# Patient Record
Sex: Male | Born: 1975 | Race: Black or African American | Hispanic: No | Marital: Married | Smoking: Never smoker
Health system: Southern US, Community
[De-identification: ages and names within clinical notes are randomized; demographics above are authoritative.]

## PROBLEM LIST (undated history)

## (undated) DIAGNOSIS — I10 Essential (primary) hypertension: Secondary | ICD-10-CM

---

## 2015-03-29 ENCOUNTER — Ambulatory Visit: Payer: Self-pay | Admitting: Family Medicine

## 2015-03-30 ENCOUNTER — Ambulatory Visit
Admission: EM | Admit: 2015-03-30 | Discharge: 2015-03-30 | Disposition: A | Discharge status: Home / Self Care | Payer: Managed Care, Other (non HMO) | Attending: Family Medicine | Admitting: Family Medicine

## 2015-03-30 ENCOUNTER — Ambulatory Visit (INDEPENDENT_AMBULATORY_CARE_PROVIDER_SITE_OTHER): Payer: Managed Care, Other (non HMO)

## 2015-03-30 ENCOUNTER — Encounter: Payer: Self-pay | Admitting: *Deleted

## 2015-03-30 DIAGNOSIS — M545 Low back pain, unspecified: Secondary | ICD-10-CM

## 2015-03-30 DIAGNOSIS — M5090 Cervical disc disorder, unspecified, unspecified cervical region: Secondary | ICD-10-CM

## 2015-03-30 DIAGNOSIS — M5489 Other dorsalgia: Secondary | ICD-10-CM

## 2015-03-30 MED ORDER — NAPROXEN 500 MG PO TABS: 500.0000 mg | ORAL_TABLET | Freq: Two times a day (BID) | ORAL | Status: DC

## 2015-03-30 MED ORDER — DIAZEPAM 2 MG PO TABS: ORAL_TABLET | ORAL | Status: DC
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
## 2015-04-10 ENCOUNTER — Ambulatory Visit (INDEPENDENT_AMBULATORY_CARE_PROVIDER_SITE_OTHER): Payer: Managed Care, Other (non HMO) | Admitting: Family Medicine

## 2015-04-10 ENCOUNTER — Other Ambulatory Visit: Payer: Self-pay | Admitting: Family Medicine

## 2015-04-10 ENCOUNTER — Ambulatory Visit
Admission: RE | Admit: 2015-04-10 | Discharge: 2015-04-10 | Disposition: A | Discharge status: Home / Self Care | Payer: Managed Care, Other (non HMO) | Source: Ambulatory Visit | Attending: Family Medicine | Admitting: Family Medicine

## 2015-04-10 ENCOUNTER — Encounter: Payer: Self-pay | Admitting: Family Medicine

## 2015-04-10 VITALS — BP 146/90 | HR 92 | Ht 76.0 in | Wt 382.6 lb

## 2015-04-10 DIAGNOSIS — M546 Pain in thoracic spine: Secondary | ICD-10-CM

## 2015-04-10 MED ORDER — HYDROCODONE-ACETAMINOPHEN 10-325 MG PO TABS: 1.0000 | ORAL_TABLET | Freq: Every evening | ORAL | Status: DC | PRN

## 2015-04-10 NOTE — Patient Instructions (Signed)
Stop the Diazepam (Valium). Start taking Naproxen 500 mg twice daily with food and you may also take Tylenol 650 mg twice daily as well. You may take one hydrocodone as needed at bedtime however be aware that you should not drive, operate machinery, drink alcohol with this medication. Continue using heat to area 20 minutes at a time. I will let you know your XR result. Referral made to physical therapy.
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
## 2015-04-12 ENCOUNTER — Encounter: Payer: Self-pay | Admitting: Internal Medicine

## 2015-06-11 ENCOUNTER — Telehealth: Payer: Self-pay | Admitting: Family Medicine

## 2015-06-11 NOTE — Telephone Encounter (Signed)
Received signed records request to send records to [facility], [facility], [facility] and [facility]. Records faxed to [phone].

## 2015-06-20 DIAGNOSIS — Z0279 Encounter for issue of other medical certificate: Secondary | ICD-10-CM

## 2016-09-20 ENCOUNTER — Encounter (HOSPITAL_COMMUNITY): Payer: Self-pay | Admitting: Emergency Medicine

## 2016-09-20 ENCOUNTER — Ambulatory Visit (HOSPITAL_COMMUNITY)
Admission: EM | Admit: 2016-09-20 | Discharge: 2016-09-20 | Disposition: A | Discharge status: Nursing Home (Includes ICF) | Payer: Managed Care, Other (non HMO) | Attending: Internal Medicine | Admitting: Internal Medicine

## 2016-09-20 DIAGNOSIS — W57XXXA Bitten or stung by nonvenomous insect and other nonvenomous arthropods, initial encounter: Secondary | ICD-10-CM

## 2016-09-20 DIAGNOSIS — Z5321 Procedure and treatment not carried out due to patient leaving prior to being seen by health care provider: Secondary | ICD-10-CM | POA: Insufficient documentation

## 2016-09-20 DIAGNOSIS — L03115 Cellulitis of right lower limb: Secondary | ICD-10-CM

## 2016-09-20 DIAGNOSIS — Y939 Activity, unspecified: Secondary | ICD-10-CM | POA: Insufficient documentation

## 2016-09-20 DIAGNOSIS — S70361A Insect bite (nonvenomous), right thigh, initial encounter: Secondary | ICD-10-CM | POA: Insufficient documentation

## 2016-09-20 DIAGNOSIS — Y929 Unspecified place or not applicable: Secondary | ICD-10-CM | POA: Insufficient documentation

## 2016-09-20 DIAGNOSIS — Y999 Unspecified external cause status: Secondary | ICD-10-CM | POA: Insufficient documentation

## 2016-09-20 LAB — BASIC METABOLIC PANEL
ANION GAP: 8 (ref 5–15)
BUN: 8 mg/dL (ref 6–20)
CALCIUM: 9.1 mg/dL (ref 8.9–10.3)
CO2: 28 mmol/L (ref 22–32)
Chloride: 96 mmol/L — ABNORMAL LOW (ref 101–111)
Creatinine, Ser: 0.82 mg/dL (ref 0.61–1.24)
GLUCOSE: 370 mg/dL — AB (ref 65–99)
POTASSIUM: 4.3 mmol/L (ref 3.5–5.1)
SODIUM: 132 mmol/L — AB (ref 135–145)

## 2016-09-20 LAB — CBC
HCT: 47.7 % (ref 39.0–52.0)
Hemoglobin: 15.8 g/dL (ref 13.0–17.0)
MCH: 27.3 pg (ref 26.0–34.0)
MCHC: 33.1 g/dL (ref 30.0–36.0)
MCV: 82.5 fL (ref 78.0–100.0)
PLATELETS: 265 10*3/uL (ref 150–400)
RBC: 5.78 MIL/uL (ref 4.22–5.81)
RDW: 13 % (ref 11.5–15.5)
WBC: 10.4 10*3/uL (ref 4.0–10.5)

## 2016-09-20 LAB — I-STAT CG4 LACTIC ACID, ED: Lactic Acid, Venous: 1.78 mmol/L (ref 0.5–1.9)

## 2016-09-20 NOTE — ED Triage Notes (Signed)
Insect bite to right thigh noticed 3 days ago.  Patient has had a fever

## 2016-09-20 NOTE — ED Triage Notes (Signed)
Noticed area to right inner thigh 3 days ago.  Thinks he was bitten by a spider.  [facility]  Sent here.  Reports drainage coming from wound.  Red and swollen.  Also reporting pain all over body with paralysis in both hands.  Also c/o dizzy spells.

## 2016-09-20 NOTE — ED Provider Notes (Signed)
CSN: [id number]     Arrival date & time 09/20/16  1748 History   First MD Initiated Contact with Patient 09/20/16 1942     Chief Complaint  Patient presents with  . Insect Bite   (Consider location/radiation/quality/duration/timing/severity/associated sxs/prior Treatment) Patient is a 41 year old male, presents to the urgent care today with concern for spider bite to his right groin area  4 days. Patient endorses swelling and pain and discharge at the site. Patient states that the wound is looking really bad today. Patient now is having tingling sensation at his arms and is having pain "everywhere". He also broke 2 fevers today.       History reviewed. No pertinent past medical history. No past surgical history on file. Family History  Problem Relation Age of Onset  . Arthritis Mother   . Allergies Father   . Hypertension Father   . Diabetes Father    Social History  Substance Use Topics  . Smoking status: Never Smoker  . Smokeless tobacco: Never Used  . Alcohol use No    Review of Systems  Constitutional:       See HPI    Allergies  Red dye  Home Medications   Prior to Admission medications   Medication Sig Start Date End Date Taking? Authorizing Provider  Naproxen Sodium (ALEVE PO) Take by mouth.   Yes [provider]  acetaminophen (TYLENOL) 650 MG CR tablet Take 650 mg by mouth every 8 (eight) hours as needed for pain. Take 2 pills once daily    [provider]  diazepam (VALIUM) 5 MG tablet take 1 tablet by mouth twice a day for 5 days 03/31/15   [provider]  HYDROcodone-acetaminophen (NORCO) 10-325 MG tablet Take 1 tablet by mouth at bedtime as needed. 04/10/15   Henson, Vickie L, NP-C  ibuprofen (ADVIL,MOTRIN) 200 MG tablet Take 800 mg by mouth daily.    [provider]  naproxen (NAPROSYN) 500 MG tablet Take 1 tablet (500 mg total) by mouth 2 (two) times daily. 03/30/15   Jolene ProvostPatel, Kirtida, MD   Meds Ordered and  Administered this Visit  Medications - No data to display  BP (!) 151/85 (BP Location: Left Arm)   Pulse (!) 113   Temp 99.5 F (37.5 C) (Oral)   Resp (!) 22   SpO2 97%  No data found.   Physical Exam  Constitutional: He is oriented to person, place, and time. He appears well-developed and well-nourished.  Cardiovascular: Normal rate and regular rhythm.   No murmur heard. Pulmonary/Chest: Effort normal and breath sounds normal. He has no wheezes.  Neurological: He is alert and oriented to person, place, and time.  Skin:  See picture below.   Nursing note and vitals reviewed.       Urgent Care Course     Procedures (including critical care time)  Labs Review Labs Reviewed - No data to display  Imaging Review No results found.  MDM   1. Insect bite, initial encounter   2. Cellulitis of right lower extremity    Given the extensiveness of the infection with significant swelling, pain and inflammation at the site. Patient send to ER for more comprehensive evaluation that we cannot do here in an urgent care setting.     Lucia EstelleZheng, Maurisio Ruddy, NP 09/20/16 2134

## 2016-09-21 ENCOUNTER — Emergency Department (HOSPITAL_COMMUNITY)
Admission: EM | Admit: 2016-09-21 | Discharge: 2016-09-21 | Disposition: A | Discharge status: Home / Self Care | Payer: Managed Care, Other (non HMO) | Attending: Emergency Medicine | Admitting: Emergency Medicine

## 2016-09-21 NOTE — ED Notes (Signed)
Changed pt's dressing.

## 2016-09-21 NOTE — ED Notes (Signed)
Pt left AMA °

## 2016-09-22 ENCOUNTER — Ambulatory Visit (HOSPITAL_COMMUNITY)
Admission: EM | Admit: 2016-09-22 | Discharge: 2016-09-22 | Disposition: A | Discharge status: Home / Self Care | Payer: Managed Care, Other (non HMO) | Attending: Internal Medicine | Admitting: Internal Medicine

## 2016-09-22 ENCOUNTER — Encounter (HOSPITAL_COMMUNITY): Payer: Self-pay | Admitting: Emergency Medicine

## 2016-09-22 DIAGNOSIS — L03314 Cellulitis of groin: Secondary | ICD-10-CM

## 2016-09-22 MED ORDER — HYDROCODONE-ACETAMINOPHEN 5-325 MG PO TABS
ORAL_TABLET | ORAL | Status: AC
  Filled 2016-09-22: qty 1

## 2016-09-22 MED ORDER — CEFTRIAXONE SODIUM 1 G IJ SOLR
1.0000 g | Freq: Once | INTRAMUSCULAR | Status: AC
  Administered 2016-09-22: 1 g via INTRAMUSCULAR

## 2016-09-22 MED ORDER — HYDROCODONE-ACETAMINOPHEN 5-325 MG PO TABS
2.0000 | ORAL_TABLET | Freq: Once | ORAL | Status: AC
  Administered 2016-09-22: 2 via ORAL

## 2016-09-22 MED ORDER — DOXYCYCLINE HYCLATE 100 MG PO TABS
ORAL_TABLET | ORAL | Status: AC
  Filled 2016-09-22: qty 1

## 2016-09-22 MED ORDER — DOXYCYCLINE HYCLATE 100 MG PO TABS
100.0000 mg | ORAL_TABLET | Freq: Once | ORAL | Status: AC
  Administered 2016-09-22: 100 mg via ORAL

## 2016-09-22 MED ORDER — CEFTRIAXONE SODIUM 1 G IJ SOLR
INTRAMUSCULAR | Status: AC
  Filled 2016-09-22: qty 10

## 2016-09-22 MED ORDER — DOXYCYCLINE HYCLATE 100 MG PO CAPS: 100.0000 mg | ORAL_CAPSULE | Freq: Two times a day (BID) | ORAL | 0 refills | Status: DC

## 2016-09-22 MED ORDER — LIDOCAINE HCL (PF) 1 % IJ SOLN
INTRAMUSCULAR | Status: AC
  Filled 2016-09-22: qty 2

## 2016-09-22 NOTE — Discharge Instructions (Signed)
You have been treated today with Rocephin and Doxycyline. When you go home, soak as long as you can in a warm tub of water, return to this clinic tomorrow morning. We open at 10 am, I would recommend coming in early.

## 2016-09-22 NOTE — ED Triage Notes (Signed)
On 5/12 seen at [facility] and sent to ed.   Patient was not seen in ed, patient returned to [facility] for medication.  Patient reports blister is getting larger, pain is worsening, continues running fever.

## 2016-09-22 NOTE — ED Provider Notes (Signed)
CSN: [id number]     Arrival date & time 09/22/16  1009 History   First MD Initiated Contact with Patient 09/22/16 1116     No chief complaint on file.  (Consider location/radiation/quality/duration/timing/severity/associated sxs/prior Treatment) 41 year old male presents to clinic for reevaluation of cellulitis. He was seen on 09/20/2016 here in this clinic, and referred to the emergency room for further evaluation and management. He was at the ER, had blood work drawn, but left without being seen. He presents today, stating he still in pain, and the area is still draining, he has not had any antibiotics. He is morbidly obese, however he denies any past medical problems. There is a family history of diabetes, and hypertension. He states he has had some fever, but that has improved. No nausea, vomiting, diarrhea, or other symptoms.   The history is provided by the patient.    History reviewed. No pertinent past medical history. History reviewed. No pertinent surgical history. Family History  Problem Relation Age of Onset  . Arthritis Mother   . Allergies Father   . Hypertension Father   . Diabetes Father    Social History  Substance Use Topics  . Smoking status: Never Smoker  . Smokeless tobacco: Never Used  . Alcohol use No    Review of Systems  Constitutional: Positive for chills and fever. Negative for appetite change.  Respiratory: Negative.   Cardiovascular: Negative.   Gastrointestinal: Negative.   Musculoskeletal: Negative for myalgias, neck pain and neck stiffness.  Skin:       abscess  Neurological: Negative.     Allergies  Red dye  Home Medications   Prior to Admission medications   Medication Sig Start Date End Date Taking? Authorizing Provider  acetaminophen (TYLENOL) 650 MG CR tablet Take 650 mg by mouth every 8 (eight) hours as needed for pain. Take 2 pills once daily    [provider]  diazepam (VALIUM) 5 MG tablet take 1 tablet by mouth twice a  day for 5 days 03/31/15   [provider]  doxycycline (VIBRAMYCIN) 100 MG capsule Take 1 capsule (100 mg total) by mouth 2 (two) times daily. 09/22/16   Dorena Bodo, NP  HYDROcodone-acetaminophen (NORCO) 10-325 MG tablet Take 1 tablet by mouth at bedtime as needed. 04/10/15   Henson, Vickie L, NP-C  ibuprofen (ADVIL,MOTRIN) 200 MG tablet Take 800 mg by mouth daily.    [provider]  naproxen (NAPROSYN) 500 MG tablet Take 1 tablet (500 mg total) by mouth 2 (two) times daily. 03/30/15   Jolene Provost, MD  Naproxen Sodium (ALEVE PO) Take by mouth.    [provider]   Meds Ordered and Administered this Visit   Medications  cefTRIAXone (ROCEPHIN) injection 1 g (1 g Intramuscular Given 09/22/16 1144)  doxycycline (VIBRA-TABS) tablet 100 mg (100 mg Oral Given 09/22/16 1143)  HYDROcodone-acetaminophen (NORCO/VICODIN) 5-325 MG per tablet 2 tablet (2 tablets Oral Given 09/22/16 1142)    BP (!) 141/78 (BP Location: Left Arm)   Pulse (!) 111 Comment: notified rn  Temp 98.8 F (37.1 C) (Oral)   Resp 16   SpO2 99%  No data found.   Physical Exam  Constitutional: He is oriented to person, place, and time. He appears well-developed and well-nourished. No distress.  HENT:  Head: Normocephalic and atraumatic.  Right Ear: External ear normal.  Left Ear: External ear normal.  Eyes: Conjunctivae are normal.  Neck: Normal range of motion.  Neurological: He is alert and oriented to person,  place, and time.  Skin: Skin is warm and dry. Capillary refill takes less than 2 seconds. He is not diaphoretic.  See attached photo  Psychiatric: He has a normal mood and affect. His behavior is normal.  Nursing note and vitals reviewed.     Urgent Care Course     Procedures (including critical care time)  Labs Review Labs Reviewed - No data to display  Imaging Review No results found.    MDM   1. Cellulitis of groin    Patient started on ceftriaxone, and  doxycycline in clinic, instructed to return to clinic in 24 hours for reevaluation, his blood glucose level at the ER was 370. Plan to recheck tomorrow, and most likely will start metformin. We'll also refer to [facility] to establish for primary care.    Dorena BodoKennard, Caylor Tallarico, NP 09/22/16 1156
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
## 2022-06-06 ENCOUNTER — Other Ambulatory Visit: Payer: Self-pay

## 2022-06-06 ENCOUNTER — Emergency Department (HOSPITAL_COMMUNITY)
Admission: EM | Admit: 2022-06-06 | Discharge: 2022-06-07 | DRG: 638 | Discharge status: Against Medical Advice | Payer: Medicaid - Out of State | Attending: Internal Medicine | Admitting: Internal Medicine

## 2022-06-06 ENCOUNTER — Encounter (HOSPITAL_COMMUNITY): Payer: Self-pay

## 2022-06-06 ENCOUNTER — Emergency Department (HOSPITAL_COMMUNITY): Payer: Self-pay

## 2022-06-06 DIAGNOSIS — L089 Local infection of the skin and subcutaneous tissue, unspecified: Secondary | ICD-10-CM | POA: Diagnosis not present

## 2022-06-06 DIAGNOSIS — R000 Tachycardia, unspecified: Secondary | ICD-10-CM | POA: Diagnosis present

## 2022-06-06 DIAGNOSIS — M869 Osteomyelitis, unspecified: Principal | ICD-10-CM

## 2022-06-06 DIAGNOSIS — I10 Essential (primary) hypertension: Secondary | ICD-10-CM | POA: Diagnosis present

## 2022-06-06 DIAGNOSIS — Z8249 Family history of ischemic heart disease and other diseases of the circulatory system: Secondary | ICD-10-CM

## 2022-06-06 DIAGNOSIS — E1169 Type 2 diabetes mellitus with other specified complication: Secondary | ICD-10-CM | POA: Diagnosis present

## 2022-06-06 DIAGNOSIS — E11628 Type 2 diabetes mellitus with other skin complications: Secondary | ICD-10-CM | POA: Diagnosis present

## 2022-06-06 DIAGNOSIS — E1165 Type 2 diabetes mellitus with hyperglycemia: Secondary | ICD-10-CM | POA: Diagnosis not present

## 2022-06-06 DIAGNOSIS — Z794 Long term (current) use of insulin: Secondary | ICD-10-CM

## 2022-06-06 DIAGNOSIS — Z8261 Family history of arthritis: Secondary | ICD-10-CM

## 2022-06-06 DIAGNOSIS — R509 Fever, unspecified: Secondary | ICD-10-CM | POA: Diagnosis present

## 2022-06-06 DIAGNOSIS — Z833 Family history of diabetes mellitus: Secondary | ICD-10-CM

## 2022-06-06 DIAGNOSIS — I160 Hypertensive urgency: Secondary | ICD-10-CM | POA: Diagnosis present

## 2022-06-06 DIAGNOSIS — D72829 Elevated white blood cell count, unspecified: Secondary | ICD-10-CM | POA: Diagnosis present

## 2022-06-06 DIAGNOSIS — Z5329 Procedure and treatment not carried out because of patient's decision for other reasons: Secondary | ICD-10-CM | POA: Diagnosis present

## 2022-06-06 DIAGNOSIS — Z89412 Acquired absence of left great toe: Secondary | ICD-10-CM

## 2022-06-06 HISTORY — DX: Type 2 diabetes mellitus with hyperglycemia: E11.65

## 2022-06-06 HISTORY — DX: Essential (primary) hypertension: I10

## 2022-06-06 LAB — COMPREHENSIVE METABOLIC PANEL
ALT: 9 U/L (ref 0–44)
AST: 11 U/L — ABNORMAL LOW (ref 15–41)
Albumin: <1.5 g/dL — ABNORMAL LOW (ref 3.5–5.0)
Alkaline Phosphatase: 181 U/L — ABNORMAL HIGH (ref 38–126)
Anion gap: 6 (ref 5–15)
BUN: 11 mg/dL (ref 6–20)
CO2: 27 mmol/L (ref 22–32)
Calcium: 8 mg/dL — ABNORMAL LOW (ref 8.9–10.3)
Chloride: 94 mmol/L — ABNORMAL LOW (ref 98–111)
Creatinine, Ser: 1.33 mg/dL — ABNORMAL HIGH (ref 0.61–1.24)
GFR, Estimated: >60 mL/min (ref >60)
Glucose, Bld: 385 mg/dL — ABNORMAL HIGH (ref 70–99)
Potassium: 4.5 mmol/L (ref 3.5–5.1)
Sodium: 127 mmol/L — ABNORMAL LOW (ref 135–145)
Total Bilirubin: 0.3 mg/dL (ref 0.3–1.2)
Total Protein: 7.6 g/dL (ref 6.5–8.1)

## 2022-06-06 LAB — CBC WITH DIFFERENTIAL/PLATELET
Abs Immature Granulocytes: 0.11 10*3/uL — ABNORMAL HIGH (ref 0.00–0.07)
Basophils Absolute: 0.1 10*3/uL (ref 0.0–0.1)
Basophils Relative: 1 %
Eosinophils Absolute: 0.1 10*3/uL (ref 0.0–0.5)
Eosinophils Relative: 1 %
HCT: 36.6 % — ABNORMAL LOW (ref 39.0–52.0)
Hemoglobin: 11 g/dL — ABNORMAL LOW (ref 13.0–17.0)
Immature Granulocytes: 1 %
Lymphocytes Relative: 16 %
Lymphs Abs: 1.9 10*3/uL (ref 0.7–4.0)
MCH: 23.7 pg — ABNORMAL LOW (ref 26.0–34.0)
MCHC: 30.1 g/dL (ref 30.0–36.0)
MCV: 78.9 fL — ABNORMAL LOW (ref 80.0–100.0)
Monocytes Absolute: 1.3 10*3/uL — ABNORMAL HIGH (ref 0.1–1.0)
Monocytes Relative: 10 %
Neutro Abs: 8.7 10*3/uL — ABNORMAL HIGH (ref 1.7–7.7)
Neutrophils Relative %: 71 %
Platelets: 460 10*3/uL — ABNORMAL HIGH (ref 150–400)
RBC: 4.64 MIL/uL (ref 4.22–5.81)
RDW: 14.3 % (ref 11.5–15.5)
WBC: 12.2 10*3/uL — ABNORMAL HIGH (ref 4.0–10.5)
nRBC: 0 % (ref 0.0–0.2)

## 2022-06-06 LAB — LACTIC ACID, PLASMA
Lactic Acid, Venous: 1.4 mmol/L (ref 0.5–1.9)
Lactic Acid, Venous: 1.7 mmol/L (ref 0.5–1.9)

## 2022-06-06 MED ORDER — HYDROMORPHONE HCL 1 MG/ML IJ SOLN: 0.5000 mg | INTRAMUSCULAR | Status: DC | PRN

## 2022-06-06 MED ORDER — SODIUM CHLORIDE 0.9 % IV SOLN: INTRAVENOUS | Status: DC

## 2022-06-06 MED ORDER — LACTATED RINGERS IV BOLUS
1000.0000 mL | Freq: Once | INTRAVENOUS | Status: AC
  Administered 2022-06-06: 1000 mL via INTRAVENOUS

## 2022-06-06 MED ORDER — VANCOMYCIN HCL 1500 MG/300ML IV SOLN
1500.0000 mg | Freq: Two times a day (BID) | INTRAVENOUS | Status: DC
  Filled 2022-06-06: qty 300

## 2022-06-06 MED ORDER — SODIUM CHLORIDE 0.9 % IV SOLN
2.0000 g | Freq: Three times a day (TID) | INTRAVENOUS | Status: DC
  Administered 2022-06-06: 2 g via INTRAVENOUS
  Filled 2022-06-06 (×2): qty 12.5

## 2022-06-06 MED ORDER — VANCOMYCIN HCL 10 G IV SOLR
2500.0000 mg | Freq: Once | INTRAVENOUS | Status: AC
  Administered 2022-06-06: 2500 mg via INTRAVENOUS
  Filled 2022-06-06: qty 40

## 2022-06-06 MED ORDER — BISACODYL 5 MG PO TBEC: 5.0000 mg | DELAYED_RELEASE_TABLET | Freq: Every day | ORAL | Status: DC | PRN

## 2022-06-06 MED ORDER — CARVEDILOL 12.5 MG PO TABS
25.0000 mg | ORAL_TABLET | Freq: Two times a day (BID) | ORAL | Status: DC
  Administered 2022-06-06 – 2022-06-07 (×2): 25 mg via ORAL
  Filled 2022-06-06 (×2): qty 2

## 2022-06-06 MED ORDER — HYDROCODONE-ACETAMINOPHEN 5-325 MG PO TABS
1.0000 | ORAL_TABLET | ORAL | Status: AC
  Administered 2022-06-06: 1 via ORAL
  Filled 2022-06-06: qty 1

## 2022-06-06 MED ORDER — OXYCODONE HCL 5 MG PO TABS: 5.0000 mg | ORAL_TABLET | ORAL | Status: DC | PRN

## 2022-06-06 MED ORDER — INSULIN ASPART 100 UNIT/ML IJ SOLN
0.0000 [IU] | INTRAMUSCULAR | Status: DC
  Administered 2022-06-07 (×3): 9 [IU] via SUBCUTANEOUS
  Administered 2022-06-07: 7 [IU] via SUBCUTANEOUS

## 2022-06-06 MED ORDER — METRONIDAZOLE 500 MG/100ML IV SOLN
500.0000 mg | Freq: Two times a day (BID) | INTRAVENOUS | Status: DC
  Administered 2022-06-06 – 2022-06-07 (×2): 500 mg via INTRAVENOUS
  Filled 2022-06-06 (×2): qty 100

## 2022-06-06 MED ORDER — ACETAMINOPHEN 650 MG RE SUPP: 650.0000 mg | Freq: Four times a day (QID) | RECTAL | Status: DC | PRN

## 2022-06-06 MED ORDER — HYDROCODONE-ACETAMINOPHEN 5-325 MG PO TABS
1.0000 | ORAL_TABLET | Freq: Once | ORAL | Status: AC
  Administered 2022-06-06: 1 via ORAL
  Filled 2022-06-06: qty 1

## 2022-06-06 MED ORDER — ACETAMINOPHEN 325 MG PO TABS: 650.0000 mg | ORAL_TABLET | Freq: Four times a day (QID) | ORAL | Status: DC | PRN

## 2022-06-06 MED ORDER — POLYETHYLENE GLYCOL 3350 17 G PO PACK: 17.0000 g | PACK | Freq: Every day | ORAL | Status: DC | PRN

## 2022-06-06 MED ORDER — ONDANSETRON HCL 4 MG PO TABS: 4.0000 mg | ORAL_TABLET | Freq: Four times a day (QID) | ORAL | Status: DC | PRN

## 2022-06-06 MED ORDER — INSULIN GLARGINE-YFGN 100 UNIT/ML ~~LOC~~ SOLN
10.0000 [IU] | Freq: Every day | SUBCUTANEOUS | Status: DC
  Administered 2022-06-06: 10 [IU] via SUBCUTANEOUS
  Filled 2022-06-06 (×2): qty 0.1

## 2022-06-06 MED ORDER — ONDANSETRON HCL 4 MG/2ML IJ SOLN: 4.0000 mg | Freq: Four times a day (QID) | INTRAMUSCULAR | Status: DC | PRN

## 2022-06-06 MED ORDER — ENOXAPARIN SODIUM 80 MG/0.8ML IJ SOSY
80.0000 mg | PREFILLED_SYRINGE | INTRAMUSCULAR | Status: DC
  Administered 2022-06-07: 80 mg via SUBCUTANEOUS
  Filled 2022-06-06 (×2): qty 0.8

## 2022-06-06 MED ORDER — SODIUM CHLORIDE 0.9% FLUSH
3.0000 mL | Freq: Two times a day (BID) | INTRAVENOUS | Status: DC
  Administered 2022-06-07: 3 mL via INTRAVENOUS

## 2022-06-06 NOTE — ED Provider Notes (Signed)
[place] [facility] AT [facility] Provider Note   CSN: [id number] Arrival date & time: 06/06/22  1318     History  Chief Complaint  Patient presents with   Lt Foot wound    Quadre Bristol. is a 47 y.o. male.  47 year old male presents today for evaluation of left foot wound that he is concerned is infected.  Has history of osteomyelitis.  Underwent resection in [name] in November 2023.  Has not had follow-up with podiatry since then.  States over the past 2 weeks he has had foul smell, and drainage from the wound.  Also endorses subjective fever with diaphoresis.  Particularly over the past 2 nights.  MSE note mentioned patient was taken daptomycin.  Patient states he has not been taking any antibiotics since about a week or so since his surgery.  The history is provided by the patient. No language interpreter was used.       Home Medications Prior to Admission medications   Medication Sig Start Date End Date Taking? Authorizing Provider  acetaminophen (TYLENOL) 650 MG CR tablet Take 650 mg by mouth every 8 (eight) hours as needed for pain. Take 2 pills once daily    [provider]  diazepam (VALIUM) 5 MG tablet take 1 tablet by mouth twice a day for 5 days 03/31/15   [provider]  doxycycline (VIBRAMYCIN) 100 MG capsule Take 1 capsule (100 mg total) by mouth 2 (two) times daily. 09/22/16   Dorena Bodo, NP  HYDROcodone-acetaminophen (NORCO) 10-325 MG tablet Take 1 tablet by mouth at bedtime as needed. 04/10/15   Henson, Vickie L, NP-C  ibuprofen (ADVIL,MOTRIN) 200 MG tablet Take 800 mg by mouth daily.    [provider]  naproxen (NAPROSYN) 500 MG tablet Take 1 tablet (500 mg total) by mouth 2 (two) times daily. 03/30/15   Jolene Provost, MD  Naproxen Sodium (ALEVE PO) Take by mouth.    [provider]      Allergies    Red dye    Review of Systems   Review of Systems  Constitutional:  Positive for  chills and fever.  Musculoskeletal:  Positive for arthralgias.  Skin:  Positive for wound.  All other systems reviewed and are negative.   Physical Exam Updated Vital Signs BP (!) 132/95 (BP Location: Left Arm)   Pulse (!) 109   Temp 100 F (37.8 C)   Resp 19   Ht 6\' 4"  (1.93 m)   Wt (!) 170.1 kg   SpO2 96%   BMI 45.65 kg/m  Physical Exam Vitals and nursing note reviewed.  Constitutional:      General: He is not in acute distress.    Appearance: Normal appearance. He is not ill-appearing.  HENT:     Head: Normocephalic and atraumatic.     Nose: Nose normal.  Eyes:     General: No scleral icterus.    Extraocular Movements: Extraocular movements intact.     Conjunctiva/sclera: Conjunctivae normal.  Cardiovascular:     Rate and Rhythm: Normal rate and regular rhythm.     Pulses: Normal pulses.  Pulmonary:     Effort: Pulmonary effort is normal. No respiratory distress.     Breath sounds: Normal breath sounds. No wheezing or rales.  Musculoskeletal:        General: Normal range of motion.     Cervical back: Normal range of motion.  Skin:    General: Skin is warm and dry.  Comments: See attached image for wound description.  Neurological:     General: No focal deficit present.     Mental Status: He is alert. Mental status is at baseline.         ED Results / Procedures / Treatments   Labs (all labs ordered are listed, but only abnormal results are displayed) Labs Reviewed  COMPREHENSIVE METABOLIC PANEL - Abnormal; Notable for the following components:      Result Value   Sodium 127 (*)    Chloride 94 (*)    Glucose, Bld 385 (*)    Creatinine, Ser 1.33 (*)    Calcium 8.0 (*)    Albumin <1.5 (*)    AST 11 (*)    Alkaline Phosphatase 181 (*)    All other components within normal limits  CBC WITH DIFFERENTIAL/PLATELET - Abnormal; Notable for the following components:   WBC 12.2 (*)    Hemoglobin 11.0 (*)    HCT 36.6 (*)    MCV 78.9 (*)    MCH 23.7 (*)     Platelets 460 (*)    Neutro Abs 8.7 (*)    Monocytes Absolute 1.3 (*)    Abs Immature Granulocytes 0.11 (*)    All other components within normal limits  CULTURE, BLOOD (ROUTINE X 2)  CULTURE, BLOOD (ROUTINE X 2)  LACTIC ACID, PLASMA  LACTIC ACID, PLASMA    EKG None  Radiology DG Foot Complete Left  Result Date: 06/06/2022 CLINICAL DATA:  Left foot wound, possible osteomyelitis EXAM: LEFT FOOT - COMPLETE 3+ VIEW COMPARISON:  None Available. FINDINGS: Bony destructive findings and erosions along the head of the fifth metatarsal with adjacent abnormal gas in the soft tissues, and periosteal reaction along the shaft of the fifth metatarsal. This appearance is compatible with conventional radiographic findings of osteomyelitis. Irregular proximal metaphysis of the proximal phalanx of the small toe, potentially from an impacted fracture or osteomyelitis involving the base of the proximal phalanx. 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 Osteoid noted deposited round the first metatarsal. Low-grade periosteal reaction in the second and third metatarsals, nonspecific, and not necessarily indicative of infection involving the structures. Mild prominence of soft tissues along the medial ball of the foot in the vicinity of the resected first digit, with some reticular lucency along the periphery of the soft tissues, possibly from cracked skin although I cannot exclude soft tissue gas. There is subcutaneous edema throughout the foot and ankle. Substantial plantar and Achilles spurs. Haglund deformity of the calcaneus. IMPRESSION: 1. Bony destructive findings and erosions along the head of the fifth metatarsal compatible with osteomyelitis. 2. Irregular appearance of the proximal metaphysis of the proximal phalanx of the small toe, potentially from an impacted fracture or osteomyelitis involving the base of the proximal phalanx. 3. Mild prominence of the soft tissues along the medial  ball of the foot in the vicinity of the resected first digit, with some reticular lucency along the periphery of the soft tissues, possibly from cracked skin although I cannot exclude soft tissue gas in this vicinity. 4. Plantar calcaneal spurs and Haglund deformity. Electronically Signed   By: Van Clines M.D.   On: 06/06/2022 14:18    Procedures Procedures    Medications Ordered in ED Medications  HYDROcodone-acetaminophen (NORCO/VICODIN) 5-325 MG per tablet 1 tablet (1 tablet Oral Given 06/06/22 1355)  HYDROcodone-acetaminophen (NORCO/VICODIN) 5-325 MG per tablet 1 tablet (1 tablet Oral Given 06/06/22 1837)    ED Course/ Medical Decision  Making/ A&P                             Medical Decision Making Risk Decision regarding hospitalization.   Medical Decision Making / ED Course   This patient presents to the ED for concern of foot wound, this involves an extensive number of treatment options, and is a complaint that carries with it a high risk of complications and morbidity.  The differential diagnosis includes osteomyelitis, wound infection  MDM: 47 year old male with past medical history of diabetes, osteomyelitis presents today for evaluation of foul-smelling wound with drainage.  Also endorses subjective fever.  Concern for osteomyelitis.  CBC reveals mild leukocytosis.  CMP reveals glucose of 385, sodium of 127.  Sodium 134 when corrected for the hyperglycemia.  Creatinine 1.33.  Otherwise without acute concerns.  Lactic acid of 1.4.  Low-grade temperature at 100.0.  Tachycardic.  Technically he does not meet SIRS criteria.  Will provide fluid bolus, IV antibiotics.  Will hold off on insulin dose right now for the hyperglycemia and reassess following fluid bolus.  He is not on any fast acting insulin.  X-ray does reveal bony destruction consistent with osteomyelitis.  Will discuss with hospitalist for admission.  Discussed with hospitalist will evaluate patient for  admission.  Lab Tests: -I ordered, reviewed, and interpreted labs.   The pertinent results include:   Labs Reviewed  COMPREHENSIVE METABOLIC PANEL - Abnormal; Notable for the following components:      Result Value   Sodium 127 (*)    Chloride 94 (*)    Glucose, Bld 385 (*)    Creatinine, Ser 1.33 (*)    Calcium 8.0 (*)    Albumin <1.5 (*)    AST 11 (*)    Alkaline Phosphatase 181 (*)    All other components within normal limits  CBC WITH DIFFERENTIAL/PLATELET - Abnormal; Notable for the following components:   WBC 12.2 (*)    Hemoglobin 11.0 (*)    HCT 36.6 (*)    MCV 78.9 (*)    MCH 23.7 (*)    Platelets 460 (*)    Neutro Abs 8.7 (*)    Monocytes Absolute 1.3 (*)    Abs Immature Granulocytes 0.11 (*)    All other components within normal limits  CULTURE, BLOOD (ROUTINE X 2)  CULTURE, BLOOD (ROUTINE X 2)  LACTIC ACID, PLASMA  LACTIC ACID, PLASMA      EKG  EKG Interpretation  Date/Time:    Ventricular Rate:    PR Interval:    QRS Duration:   QT Interval:    QTC Calculation:   R Axis:     Text Interpretation:           Imaging Studies ordered: I ordered imaging studies including left foot x ray I independently visualized and interpreted imaging. I agree with the radiologist interpretation   Medicines ordered and prescription drug management: Meds ordered this encounter  Medications   HYDROcodone-acetaminophen (NORCO/VICODIN) 5-325 MG per tablet 1 tablet   HYDROcodone-acetaminophen (NORCO/VICODIN) 5-325 MG per tablet 1 tablet    -I have reviewed the patients home medicines and have made adjustments as needed  Critical interventions IV antibiotics, IV fluid bolus   Reevaluation: After the interventions noted above, I reevaluated the patient and found that they have :stayed the same  Co morbidities that complicate the patient evaluation History reviewed. No pertinent past medical history.    Dispostion: Patient discussed with hospitalist  will  evaluate patient for admission.   Final Clinical Impression(s) / ED Diagnoses Final diagnoses:  Osteomyelitis of left foot, unspecified type (HCC)  Wound infection    Rx / DC Orders ED Discharge Orders     None         Marita Kansas, PA-C 06/06/22 2230    Jacalyn Lefevre, MD 06/06/22 2329

## 2022-06-06 NOTE — ED Provider Triage Note (Signed)
Emergency Medicine Provider Triage Evaluation Note  Alan Tapia. , a 47 y.o. male  was evaluated in triage.  Pt complains of left foot wound.  States that he has had chronic issues with infected foot with 2 surgeries involving amputation secondary to osteomyelitis.  Patient reports worsening of pain, swelling, discharge over the past 2 weeks.  States has been taking at home daptomycin which has not been helping.  Reports subjective chills at home.  Denies fever, known injury/trauma.  Review of Systems  Positive: See above Negative:   Physical Exam  BP (!) 179/100   Pulse (!) 114   Temp 98.2 F (36.8 C) (Oral)   Resp 20   Ht 6\' 4"  (1.93 m)   Wt (!) 170.1 kg   SpO2 99%   BMI 45.65 kg/m  Gen:   Awake, no distress   Resp:  Normal effort  MSK:   Moves extremities without difficulty  Other:         Medical Decision Making  Medically screening exam initiated at 1:45 PM.  Appropriate orders placed.  Alan Tapia. was informed that the remainder of the evaluation will be completed by another provider, this initial triage assessment does not replace that evaluation, and the importance of remaining in the ED until their evaluation is complete.     Wilnette Kales, [place] 06/06/22 1347

## 2022-06-06 NOTE — ED Triage Notes (Signed)
Pt came in via POV d/t foot infection on the bottom of his Lt foot. 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 Pt arrives to ED with c/o a new odor in that foot the past 2 weeks, rates pain 10/10.

## 2022-06-06 NOTE — Progress Notes (Signed)
Pharmacy Antibiotic Note  Alan Tapia. is a 47 y.o. male admitted on 06/06/2022 with L foot diabetic wound  osteomyelitis .  Pharmacy has been consulted for Cefepime and Vancomycin dosing.  WBC 12.2, Tmax 100, LA 1.4 > 1.7, HR 100-110s SCr 1.33  Plan: Initiate Cefepime 2g IV q8h Initiate loading dose of Vancomycin 2500mg  IV x 1, followed by  Vancomycin 1500mg  IV q12h (eAUC ~474)    > Goal AUC 400-550    > Check vancomycin levels at steady state  Continue metronidazole 500mg  IV q12h per MD Monitor daily CBC, temp, SCr, and for clinical signs of improvement  F/u cultures and de-escalate antibiotics as able   Height: 6\' 4"  (193 cm) Weight: (!) 170.1 kg (375 lb) IBW/kg (Calculated) : 86.8  Temp (24hrs), Avg:99 F (37.2 C), Min:98.2 F (36.8 C), Max:100 F (37.8 C)  Recent Labs  Lab 06/06/22 1346 06/06/22 2132  WBC 12.2*  --   CREATININE 1.33*  --   LATICACIDVEN 1.4 1.7    Estimated Creatinine Clearance: 116.6 mL/min (A) (by C-G formula based on SCr of 1.33 mg/dL (H)).    Allergies  Allergen Reactions   Red Dye Swelling    Antimicrobials this admission: Cefepime 1/26 >>  Vancomycin 1/26 >>  Metronidazole 1/26 >>   Dose adjustments this admission: N/A  Microbiology results: 1/26 BCx: pending  Thank you for allowing pharmacy to be a part of this patient's care.  Luisa Hart, PharmD, BCPS Clinical Pharmacist 06/06/2022 10:56 PM   Please refer to [facility] for pharmacy phone number

## 2022-06-06 NOTE — H&P (Signed)
History and Physical    [name] ZOX:[id number] DOB: 1976/01/10 DOA: 06/06/2022  PCP: Avanell Shackleton, NP-C   Patient coming from: Home   Chief Complaint: Left foot wound   HPI: Alan Tapia. is a 47 y.o. 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   Patient had his prior foot surgeries performed in [name] but has since moved to this area.  He never followed up after the surgery and still has sutures in the left foot.  He has noticed increasing pain and increasing foul odor and drainage from the plantar aspect of his left foot over the past 2 weeks.  He reports subjective fevers for the past couple days.  ED Course: Upon arrival to the ED, patient is found to have a temperature of 37.8 C with mildly elevated heart rate and elevated blood pressures.  WBC is 12,200 and platelets 460,000.  Lactic acid is reassuringly normal.  Chemistry panel notable for glucose 395, creatinine 1.33, alkaline phosphatase 181, and albumin less than 1.5.  Plain radiographs of the left foot are concerning for osteomyelitis involving the head of the fifth metatarsal and fracture or osteomyelitis involving proximal fifth phalanx.   Blood cultures were collected in the ED and the patient was given 1 L of LR, vancomycin, cefepime, and 2 doses of Norco.  Review of Systems:  All other systems reviewed and apart from HPI, are negative.  Past Medical History:  Diagnosis Date   Hypertension    Uncontrolled type 2 diabetes mellitus with hyperglycemia, with long-term current use of insulin (HCC) 06/06/2022    History reviewed. No pertinent surgical history.  Social History:   reports that he has never smoked. He has never used smokeless tobacco. He reports that he  does not drink alcohol and does not use drugs.  Allergies  Allergen Reactions   Red Dye Swelling    Family History  Problem Relation Age of Onset   Arthritis Mother    Allergies Father    Hypertension Father    Diabetes Father      Prior to Admission medications   Medication Sig Start Date End Date Taking? Authorizing Provider  acetaminophen (TYLENOL) 650 MG CR tablet Take 650 mg by mouth every 8 (eight) hours as needed for pain. Take 2 pills once daily    [provider]  diazepam (VALIUM) 5 MG tablet take 1 tablet by mouth twice a day for 5 days 03/31/15   [provider]  doxycycline (VIBRAMYCIN) 100 MG capsule Take 1 capsule (100 mg total) by mouth 2 (two) times daily. 09/22/16   Dorena Bodo, NP  HYDROcodone-acetaminophen (NORCO) 10-325 MG tablet Take 1 tablet by mouth at bedtime as needed. 04/10/15   Henson, Vickie L, NP-C  ibuprofen (ADVIL,MOTRIN) 200 MG tablet Take 800 mg by mouth daily.    [provider]  naproxen (NAPROSYN) 500 MG tablet Take 1 tablet (500 mg total) by mouth 2 (two) times daily. 03/30/15   Jolene Provost, MD  Naproxen Sodium (ALEVE PO) Take by mouth.    [provider]    Physical Exam: Vitals:   06/06/22 1326 06/06/22 1330 06/06/22 1659 06/06/22 2009  BP: (!) 179/100  (!) 145/93 (!) 132/95  Pulse: (!) 114  (!) 111 (!) 109  Resp: 20  16  19  Temp: 98.2 F (36.8 C)  98.7 F (37.1 C) 100 F (37.8 C)  TempSrc: Oral     SpO2: 99%  98% 96%  Weight:  (!) 170.1 kg    Height:  6\' 4"  (1.93 m)      Constitutional: NAD, no pallor or diaphoresis  Eyes: PERTLA, lids and conjunctivae normal ENMT: Mucous membranes are moist. Posterior pharynx clear of any exudate or lesions.   Neck: supple, no masses  Respiratory: no wheezing, no crackles. No accessory muscle use.  Cardiovascular: S1 & S2 heard, regular rate and rhythm. No JVD. Abdomen: No distension, no tenderness, soft. Bowel sounds active.  Musculoskeletal: no  clubbing / cyanosis. S/p left great toe amputation.   Skin: Ulceration of plantar aspect of left forefoot with foul odor and serosanguinous drainage; embedded sutures noted. Warm, dry, well-perfused. Neurologic: CN 2-12 grossly intact. Moving all extremities. Alert and oriented.  Psychiatric: Calm. Cooperative.    Labs and Imaging on Admission: I have personally reviewed following labs and imaging studies  CBC: Recent Labs  Lab 06/06/22 1346  WBC 12.2*  NEUTROABS 8.7*  HGB 11.0*  HCT 36.6*  MCV 78.9*  PLT 620*   Basic Metabolic Panel: Recent Labs  Lab 06/06/22 1346  NA 127*  K 4.5  CL 94*  CO2 27  GLUCOSE 385*  BUN 11  CREATININE 1.33*  CALCIUM 8.0*   GFR: Estimated Creatinine Clearance: 116.6 mL/min (A) (by C-G formula based on SCr of 1.33 mg/dL (H)). Liver Function Tests: Recent Labs  Lab 06/06/22 1346  AST 11*  ALT 9  ALKPHOS 181*  BILITOT 0.3  PROT 7.6  ALBUMIN <1.5*   No results for input(s): "LIPASE", "AMYLASE" in the last 168 hours. No results for input(s): "AMMONIA" in the last 168 hours. Coagulation Profile: No results for input(s): "INR", "PROTIME" in the last 168 hours. Cardiac Enzymes: No results for input(s): "CKTOTAL", "CKMB", "CKMBINDEX", "TROPONINI" in the last 168 hours. BNP (last 3 results) No results for input(s): "PROBNP" in the last 8760 hours. HbA1C: No results for input(s): "HGBA1C" in the last 72 hours. CBG: No results for input(s): "GLUCAP" in the last 168 hours. Lipid Profile: No results for input(s): "CHOL", "HDL", "LDLCALC", "TRIG", "CHOLHDL", "LDLDIRECT" in the last 72 hours. Thyroid Function Tests: No results for input(s): "TSH", "T4TOTAL", "FREET4", "T3FREE", "THYROIDAB" in the last 72 hours. Anemia Panel: No results for input(s): "VITAMINB12", "FOLATE", "FERRITIN", "TIBC", "IRON", "RETICCTPCT" in the last 72 hours. Urine analysis: No results found for: "COLORURINE", "APPEARANCEUR", "LABSPEC", "PHURINE", "GLUCOSEU",  "HGBUR", "BILIRUBINUR", "KETONESUR", "PROTEINUR", "UROBILINOGEN", "NITRITE", "LEUKOCYTESUR" Sepsis Labs: @LABRCNTIP (procalcitonin:4,lacticidven:4) )No results found for this or any previous visit (from the past 240 hour(s)).   Radiological Exams on Admission: DG Foot Complete Left  Result Date: 06/06/2022 CLINICAL DATA:  Left foot wound, possible osteomyelitis EXAM: LEFT FOOT - COMPLETE 3+ VIEW COMPARISON:  None Available. FINDINGS: Bony destructive findings and erosions along the head of the fifth metatarsal with adjacent abnormal gas in the soft tissues, and periosteal reaction along the shaft of the fifth metatarsal. This appearance is compatible with conventional radiographic findings of osteomyelitis. Irregular proximal metaphysis of the proximal phalanx of the small toe, potentially from an impacted fracture or osteomyelitis involving the base of the proximal phalanx. 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 Osteoid noted deposited round the first metatarsal. Low-grade periosteal reaction in the second and third metatarsals, nonspecific, and not necessarily indicative of infection involving the structures. Mild prominence of soft tissues along  the medial ball of the foot in the vicinity of the resected first digit, with some reticular lucency along the periphery of the soft tissues, possibly from cracked skin although I cannot exclude soft tissue gas. There is subcutaneous edema throughout the foot and ankle. Substantial plantar and Achilles spurs. Haglund deformity of the calcaneus. IMPRESSION: 1. Bony destructive findings and erosions along the head of the fifth metatarsal compatible with osteomyelitis. 2. Irregular appearance of the proximal metaphysis of the proximal phalanx of the small toe, potentially from an impacted fracture or osteomyelitis involving the base of the proximal phalanx. 3. Mild prominence of the soft tissues along the medial ball of the foot in the  vicinity of the resected first digit, with some reticular lucency along the periphery of the soft tissues, possibly from cracked skin although I cannot exclude soft tissue gas in this vicinity. 4. Plantar calcaneal spurs and Haglund deformity. Electronically Signed   By: Van Clines M.D.   On: 06/06/2022 14:18     Assessment/Plan   1. Diabetic left foot infection - He has multiple SIRS criteria but normal lactate and no resultant organ dysfunction  - There is osteomyelitis noted on plain radiographs  - ABIs were done in [place] 2 months ago and "normal arterial perfusion both feet" was noted  - Continue broad-spectrum antibiotics, trend inflammatory markers, follow cultures and clinical course, consult orthopedic surgery or podiatry in am    2. Insulin-dependent DM  - A1c was 13.3% in November 2023  - Check CBGs and continue insulin    3. Hypertensive urgency  - BP severely elevated without evidence for end organ damage  - Continue Coreg, use hydralazine as-needed     DVT prophylaxis: Lovenox  Code Status: Full  Level of Care: Level of care: Telemetry Surgical Family Communication: none present  Disposition Plan:  Patient is from: Home   Anticipated d/c is to: TBD Anticipated d/c date is: 06/09/22  Patient currently: Pending treatment of infection, surgical consultation Consults called: None  Admission status: Inpatient     Vianne Bulls, MD [facility]  06/06/2022, 10:37 PM

## 2022-06-07 ENCOUNTER — Other Ambulatory Visit: Payer: Self-pay | Admitting: Podiatry

## 2022-06-07 LAB — HEMOGLOBIN A1C
Hgb A1c MFr Bld: 12.3 % — ABNORMAL HIGH (ref 4.8–5.6)
Mean Plasma Glucose: 306.31 mg/dL

## 2022-06-07 LAB — CBC
HCT: 30.8 % — ABNORMAL LOW (ref 39.0–52.0)
Hemoglobin: 9.6 g/dL — ABNORMAL LOW (ref 13.0–17.0)
MCH: 24.1 pg — ABNORMAL LOW (ref 26.0–34.0)
MCHC: 31.2 g/dL (ref 30.0–36.0)
MCV: 77.2 fL — ABNORMAL LOW (ref 80.0–100.0)
Platelets: 362 10*3/uL (ref 150–400)
RBC: 3.99 MIL/uL — ABNORMAL LOW (ref 4.22–5.81)
RDW: 14.3 % (ref 11.5–15.5)
WBC: 11.9 10*3/uL — ABNORMAL HIGH (ref 4.0–10.5)
nRBC: 0 % (ref 0.0–0.2)

## 2022-06-07 LAB — COMPREHENSIVE METABOLIC PANEL
ALT: 8 U/L (ref 0–44)
AST: 13 U/L — ABNORMAL LOW (ref 15–41)
Albumin: <1.5 g/dL — ABNORMAL LOW (ref 3.5–5.0)
Alkaline Phosphatase: 144 U/L — ABNORMAL HIGH (ref 38–126)
Anion gap: 7 (ref 5–15)
BUN: 12 mg/dL (ref 6–20)
CO2: 25 mmol/L (ref 22–32)
Calcium: 7.7 mg/dL — ABNORMAL LOW (ref 8.9–10.3)
Chloride: 96 mmol/L — ABNORMAL LOW (ref 98–111)
Creatinine, Ser: 1.44 mg/dL — ABNORMAL HIGH (ref 0.61–1.24)
GFR, Estimated: >60 mL/min (ref >60)
Glucose, Bld: 473 mg/dL — ABNORMAL HIGH (ref 70–99)
Potassium: 4.4 mmol/L (ref 3.5–5.1)
Sodium: 128 mmol/L — ABNORMAL LOW (ref 135–145)
Total Bilirubin: 0.4 mg/dL (ref 0.3–1.2)
Total Protein: 6.4 g/dL — ABNORMAL LOW (ref 6.5–8.1)

## 2022-06-07 LAB — CBG MONITORING, ED
Glucose-Capillary: 509 mg/dL (ref 70–99)
Glucose-Capillary: 454 mg/dL — ABNORMAL HIGH (ref 70–99)
Glucose-Capillary: 395 mg/dL — ABNORMAL HIGH (ref 70–99)
Glucose-Capillary: 333 mg/dL — ABNORMAL HIGH (ref 70–99)

## 2022-06-07 LAB — SEDIMENTATION RATE: Sed Rate: 119 mm/hr — ABNORMAL HIGH (ref 0–16)

## 2022-06-07 LAB — C-REACTIVE PROTEIN: CRP: 10.5 mg/dL — ABNORMAL HIGH (ref <1.0)

## 2022-06-07 MED ORDER — INSULIN GLARGINE-YFGN 100 UNIT/ML ~~LOC~~ SOLN
25.0000 [IU] | Freq: Every day | SUBCUTANEOUS | Status: DC
  Administered 2022-06-07: 25 [IU] via SUBCUTANEOUS
  Filled 2022-06-07: qty 0.25

## 2022-06-07 NOTE — Inpatient Diabetes Management (Signed)
Inpatient Diabetes Program Recommendations  AACE/ADA: New Consensus Statement on Inpatient Glycemic Control (2015)  Target Ranges:  Prepandial:   less than 140 mg/dL      Peak postprandial:   less than 180 mg/dL (1-2 hours)      Critically ill patients:  140 - 180 mg/dL   Lab Results  Component Value Date   ZWCHEN 277 (H) 06/07/2022   HGBA1C 12.3 (H) 06/07/2022    Review of Glycemic Control  Diabetes history: type 2 Outpatient Diabetes medications: none listed currently Current orders for Inpatient glycemic control: Semglee 10 units daily, Novolog 0-9 units every 4 hours  Inpatient Diabetes Program Recommendations:   Noted that patient had insulin ordered in Care Everywhere from [facility]: Lantus 20 units at HS (12/30/21), Humalog 15 units TID with meals (04/16/22)  Recommend increasing Semglee to 25 units daily (weight based 170 kg X 0.15=25.5 units) and increase Novolog correction scale to MODERATE (0-15 units) every 4 hours. May need to titrate dosages when eating.   Harvel Ricks RN BSN CDE Diabetes Coordinator Pager: [phone]  8am-5pm

## 2022-06-07 NOTE — ED Notes (Signed)
Pt left AMA paper was signes and Dr Karleen Hampshire was made aware

## 2022-06-07 NOTE — TOC Initial Note (Signed)
Transition of Care (TOC) - Initial/Assessment Note    Patient Details  Name: Alan Tapia. MRN: [id number] Date of Birth: December 20, 1975  Transition of Care [facility]) CM/SW Contact:    Verdell Carmine, RN Phone Number: 06/07/2022, 9:13 AM  Clinical Narrative:                  Patient presents with drainage from foot. Patient had resided in [place] where he had a partial foot amputation. He currently had [facility], which needs to be switched over to [place] if qualifies. Will send information to [facility] for assist. Patient did not follow up and still had sutures in place on presentation. BS 385 , currently no medications listed that he takes for diabetes.  The patient may need DME post DC. He does have Medicaid insurance, so he is ineligible for Medication [facility]) assistance. TOC will follow for needs, recommendations, and transitions of care.   Barriers to Discharge: Continued Medical Work up   Patient Goals and CMS Choice            Expected Discharge Plan and Services  Home self care     Living arrangements for the past 2 months: Apartment                                      Prior Living Arrangements/Services Living arrangements for the past 2 months: Apartment Lives with:: Self Patient language and need for interpreter reviewed:: Yes        Need for Family Participation in Patient Care: Yes (Comment) Care giver support system in place?: Yes (comment)   Criminal Activity/Legal Involvement Pertinent to Current Situation/Hospitalization: No - Comment as needed  Activities of Daily Living      Permission Sought/Granted                  Emotional Assessment       Orientation: : Oriented to Self, Oriented to Place, Oriented to  Time, Oriented to Situation Alcohol / Substance Use: Not Applicable Psych Involvement: No (comment)  Admission diagnosis:  Diabetic foot infection ([place]) [Y18.563, L08.9] Patient Active Problem List   Diagnosis Date Noted    Diabetic foot infection ([place]) 06/06/2022   Uncontrolled type 2 diabetes mellitus with hyperglycemia, with long-term current use of insulin ([place]) 06/06/2022   Hypertensive urgency 06/06/2022   PCP:  Girtha Rm, NP-C Pharmacy:   [place] [phone] - [address] [place] [address] [place] [address] Phone: [phone] Fax: [phone]  [id number] - [place], [place] [address] [place] [address] Phone: [phone] Fax: [phone]     Social Determinants of Health ([place]) Social History: SDOH Screenings   Tobacco Use: Low Risk  (06/06/2022)   SDOH Interventions:     Readmission Risk Interventions     No data to display

## 2022-06-07 NOTE — Progress Notes (Signed)
PODIATRY CONSULTATION  NAME Alan Tapia. MRN [id number] DOB 1976-03-04 DOA (Not on file)   Reason for consult: No chief complaint on file. Osteomyelitis left foot  Consulting physician: Dr. Hosie Poisson MD, [facility]  History of present illness: 47 y.o. male PMHx HTN, uncontrolled DM, obesity, prior history of left great toe am[place] admitted for increased pain and odor from left foot wound.  Patient had his prior foot surgeries performed in [place] but has since moved to this area. He never followed up after the surgery and still has sutures in the left foot. He has noticed increasing pain and increasing foul odor and drainage from the plantar aspect of his left foot over the past 2 weeks. He reports subjective fevers for the past couple days.   X-rays positive for osteomyelitis of the fifth metatarsal head.  Podiatry consulted  Past Medical History:  Diagnosis Date   Hypertension    Uncontrolled type 2 diabetes mellitus with hyperglycemia, with long-term current use of insulin ([place]) 06/06/2022       Latest Ref Rng & Units 06/07/2022    3:35 AM 06/06/2022    1:46 PM 09/20/2016    8:27 PM  CBC  WBC 4.0 - 10.5 K/uL 11.9  12.2  10.4   Hemoglobin 13.0 - 17.0 g/dL 9.6  11.0  15.8   Hematocrit 39.0 - 52.0 % 30.8  36.6  47.7   Platelets 150 - 400 K/uL 362  460  265        Latest Ref Rng & Units 06/07/2022    3:35 AM 06/06/2022    1:46 PM 09/20/2016    8:27 PM  BMP  Glucose 70 - 99 mg/dL 473  385  370   BUN 6 - 20 mg/dL 12  11  8    Creatinine 0.61 - 1.24 mg/dL 1.44  1.33  0.82   Sodium 135 - 145 mmol/L 128  127  132   Potassium 3.5 - 5.1 mmol/L 4.4  4.5  4.3   Chloride 98 - 111 mmol/L 96  94  96   CO2 22 - 32 mmol/L 25  27  28    Calcium 8.9 - 10.3 mg/dL 7.7  8.0  9.1          Physical Exam: General: The patient is alert and oriented x3 in no acute distress.   Dermatology: Large diabetic foot ulcer to the left forefoot with extensive  fibrotic and necrotic debris.  [place].  Prior amputation of the first ray.  Sutures are intact.  Vascular: Chronic edema left lower extremity.  Leg is warm to touch  Neurological: Diminished via light touch  Musculoskeletal Exam: Prior amputation first ray left.  Patient ambulatory    ASSESSMENT/PLAN OF CARE Osteomyelitis left foot with diabetic foot ulcer -Patient evaluated.  X-rays reviewed -Order placed for MRI left foot -Recommend noninvasive vascular studies to establish baseline circulation and perfusion to the foot -Discussed with the patient that he will likely require additional amputation of the forefoot pending MRI results to address the acute osteomyelitis of the forefoot and salvage of the limb to prevent more proximal amputation.  Patient amenable to this plan.   -Will plan for surgery tomorrow.  N.p.o. after midnight. -Will plan to contact the patient after MRI results and vascular studies to discuss the results via telephone and proceed with surgery tomorrow likely a.m.    Thank you for the consult.  Please contact me directly via secure chat with any questions or concerns.  Edrick Kins, DPM [facility]  Dr. Edrick Kins, DPM    [address], [place] [address]                Office [phone]  Fax [phone]

## 2022-06-11 LAB — CULTURE, BLOOD (ROUTINE X 2)
Culture: NO GROWTH
Culture: NO GROWTH

## 2022-08-13 ENCOUNTER — Inpatient Hospital Stay (HOSPITAL_COMMUNITY)
Admission: EM | Admit: 2022-08-13 | Discharge: 2022-08-22 | DRG: 616 | Disposition: A | Discharge status: Rehabilitation Facility | Payer: Medicaid - Out of State | Attending: Internal Medicine | Admitting: Internal Medicine

## 2022-08-13 ENCOUNTER — Emergency Department (HOSPITAL_COMMUNITY): Payer: Medicaid - Out of State

## 2022-08-13 ENCOUNTER — Encounter (HOSPITAL_COMMUNITY): Payer: Self-pay

## 2022-08-13 ENCOUNTER — Other Ambulatory Visit: Payer: Self-pay

## 2022-08-13 DIAGNOSIS — Z91148 Patient's other noncompliance with medication regimen for other reason: Secondary | ICD-10-CM

## 2022-08-13 DIAGNOSIS — Z8249 Family history of ischemic heart disease and other diseases of the circulatory system: Secondary | ICD-10-CM | POA: Diagnosis not present

## 2022-08-13 DIAGNOSIS — Z8261 Family history of arthritis: Secondary | ICD-10-CM

## 2022-08-13 DIAGNOSIS — L089 Local infection of the skin and subcutaneous tissue, unspecified: Secondary | ICD-10-CM | POA: Diagnosis present

## 2022-08-13 DIAGNOSIS — D62 Acute posthemorrhagic anemia: Secondary | ICD-10-CM | POA: Diagnosis not present

## 2022-08-13 DIAGNOSIS — Z6841 Body Mass Index (BMI) 40.0 and over, adult: Secondary | ICD-10-CM | POA: Diagnosis not present

## 2022-08-13 DIAGNOSIS — E11621 Type 2 diabetes mellitus with foot ulcer: Secondary | ICD-10-CM | POA: Diagnosis present

## 2022-08-13 DIAGNOSIS — G4700 Insomnia, unspecified: Secondary | ICD-10-CM | POA: Diagnosis not present

## 2022-08-13 DIAGNOSIS — L03116 Cellulitis of left lower limb: Secondary | ICD-10-CM | POA: Diagnosis present

## 2022-08-13 DIAGNOSIS — Z89512 Acquired absence of left leg below knee: Secondary | ICD-10-CM | POA: Diagnosis not present

## 2022-08-13 DIAGNOSIS — M86272 Subacute osteomyelitis, left ankle and foot: Secondary | ICD-10-CM

## 2022-08-13 DIAGNOSIS — E43 Unspecified severe protein-calorie malnutrition: Secondary | ICD-10-CM | POA: Diagnosis present

## 2022-08-13 DIAGNOSIS — Z881 Allergy status to other antibiotic agents status: Secondary | ICD-10-CM

## 2022-08-13 DIAGNOSIS — R0989 Other specified symptoms and signs involving the circulatory and respiratory systems: Secondary | ICD-10-CM | POA: Diagnosis not present

## 2022-08-13 DIAGNOSIS — I10 Essential (primary) hypertension: Secondary | ICD-10-CM | POA: Diagnosis not present

## 2022-08-13 DIAGNOSIS — I509 Heart failure, unspecified: Secondary | ICD-10-CM | POA: Diagnosis not present

## 2022-08-13 DIAGNOSIS — M009 Pyogenic arthritis, unspecified: Secondary | ICD-10-CM | POA: Diagnosis present

## 2022-08-13 DIAGNOSIS — I5021 Acute systolic (congestive) heart failure: Secondary | ICD-10-CM | POA: Diagnosis present

## 2022-08-13 DIAGNOSIS — I890 Lymphedema, not elsewhere classified: Secondary | ICD-10-CM | POA: Diagnosis present

## 2022-08-13 DIAGNOSIS — F4320 Adjustment disorder, unspecified: Secondary | ICD-10-CM | POA: Diagnosis not present

## 2022-08-13 DIAGNOSIS — M869 Osteomyelitis, unspecified: Principal | ICD-10-CM

## 2022-08-13 DIAGNOSIS — I110 Hypertensive heart disease with heart failure: Secondary | ICD-10-CM | POA: Diagnosis present

## 2022-08-13 DIAGNOSIS — S88112S Complete traumatic amputation at level between knee and ankle, left lower leg, sequela: Secondary | ICD-10-CM | POA: Diagnosis not present

## 2022-08-13 DIAGNOSIS — R739 Hyperglycemia, unspecified: Secondary | ICD-10-CM | POA: Diagnosis not present

## 2022-08-13 DIAGNOSIS — M7989 Other specified soft tissue disorders: Secondary | ICD-10-CM | POA: Diagnosis not present

## 2022-08-13 DIAGNOSIS — E11628 Type 2 diabetes mellitus with other skin complications: Secondary | ICD-10-CM

## 2022-08-13 DIAGNOSIS — L97509 Non-pressure chronic ulcer of other part of unspecified foot with unspecified severity: Secondary | ICD-10-CM | POA: Diagnosis not present

## 2022-08-13 DIAGNOSIS — Z91013 Allergy to seafood: Secondary | ICD-10-CM | POA: Diagnosis not present

## 2022-08-13 DIAGNOSIS — E876 Hypokalemia: Secondary | ICD-10-CM | POA: Diagnosis present

## 2022-08-13 DIAGNOSIS — I160 Hypertensive urgency: Secondary | ICD-10-CM | POA: Diagnosis present

## 2022-08-13 DIAGNOSIS — A419 Sepsis, unspecified organism: Secondary | ICD-10-CM | POA: Diagnosis not present

## 2022-08-13 DIAGNOSIS — Z888 Allergy status to other drugs, medicaments and biological substances status: Secondary | ICD-10-CM

## 2022-08-13 DIAGNOSIS — L97529 Non-pressure chronic ulcer of other part of left foot with unspecified severity: Secondary | ICD-10-CM | POA: Diagnosis present

## 2022-08-13 DIAGNOSIS — N179 Acute kidney failure, unspecified: Secondary | ICD-10-CM | POA: Diagnosis not present

## 2022-08-13 DIAGNOSIS — S88112D Complete traumatic amputation at level between knee and ankle, left lower leg, subsequent encounter: Secondary | ICD-10-CM | POA: Diagnosis not present

## 2022-08-13 DIAGNOSIS — E1169 Type 2 diabetes mellitus with other specified complication: Principal | ICD-10-CM | POA: Diagnosis present

## 2022-08-13 DIAGNOSIS — K5901 Slow transit constipation: Secondary | ICD-10-CM | POA: Diagnosis not present

## 2022-08-13 DIAGNOSIS — R079 Chest pain, unspecified: Secondary | ICD-10-CM | POA: Diagnosis not present

## 2022-08-13 DIAGNOSIS — G548 Other nerve root and plexus disorders: Secondary | ICD-10-CM | POA: Diagnosis not present

## 2022-08-13 DIAGNOSIS — L02612 Cutaneous abscess of left foot: Secondary | ICD-10-CM | POA: Diagnosis present

## 2022-08-13 DIAGNOSIS — Z91199 Patient's noncompliance with other medical treatment and regimen due to unspecified reason: Secondary | ICD-10-CM

## 2022-08-13 DIAGNOSIS — E11649 Type 2 diabetes mellitus with hypoglycemia without coma: Secondary | ICD-10-CM | POA: Diagnosis not present

## 2022-08-13 DIAGNOSIS — I5023 Acute on chronic systolic (congestive) heart failure: Secondary | ICD-10-CM | POA: Diagnosis not present

## 2022-08-13 DIAGNOSIS — Z794 Long term (current) use of insulin: Secondary | ICD-10-CM | POA: Diagnosis not present

## 2022-08-13 DIAGNOSIS — E871 Hypo-osmolality and hyponatremia: Secondary | ICD-10-CM | POA: Diagnosis present

## 2022-08-13 DIAGNOSIS — Z4781 Encounter for orthopedic aftercare following surgical amputation: Secondary | ICD-10-CM | POA: Diagnosis not present

## 2022-08-13 DIAGNOSIS — D649 Anemia, unspecified: Secondary | ICD-10-CM | POA: Diagnosis present

## 2022-08-13 DIAGNOSIS — I420 Dilated cardiomyopathy: Secondary | ICD-10-CM | POA: Diagnosis present

## 2022-08-13 DIAGNOSIS — K5900 Constipation, unspecified: Secondary | ICD-10-CM | POA: Diagnosis not present

## 2022-08-13 DIAGNOSIS — Z79899 Other long term (current) drug therapy: Secondary | ICD-10-CM | POA: Diagnosis not present

## 2022-08-13 DIAGNOSIS — R52 Pain, unspecified: Secondary | ICD-10-CM | POA: Diagnosis not present

## 2022-08-13 DIAGNOSIS — E1165 Type 2 diabetes mellitus with hyperglycemia: Secondary | ICD-10-CM | POA: Diagnosis present

## 2022-08-13 DIAGNOSIS — G8929 Other chronic pain: Secondary | ICD-10-CM | POA: Diagnosis present

## 2022-08-13 DIAGNOSIS — M86172 Other acute osteomyelitis, left ankle and foot: Secondary | ICD-10-CM | POA: Diagnosis present

## 2022-08-13 DIAGNOSIS — Z833 Family history of diabetes mellitus: Secondary | ICD-10-CM

## 2022-08-13 DIAGNOSIS — R7989 Other specified abnormal findings of blood chemistry: Secondary | ICD-10-CM | POA: Diagnosis not present

## 2022-08-13 DIAGNOSIS — M79605 Pain in left leg: Secondary | ICD-10-CM | POA: Diagnosis not present

## 2022-08-13 DIAGNOSIS — Z791 Long term (current) use of non-steroidal anti-inflammatories (NSAID): Secondary | ICD-10-CM

## 2022-08-13 DIAGNOSIS — R9431 Abnormal electrocardiogram [ECG] [EKG]: Secondary | ICD-10-CM | POA: Diagnosis not present

## 2022-08-13 LAB — BASIC METABOLIC PANEL
Anion gap: 7 (ref 5–15)
BUN: 7 mg/dL (ref 6–20)
CO2: 22 mmol/L (ref 22–32)
Calcium: 7.5 mg/dL — ABNORMAL LOW (ref 8.9–10.3)
Chloride: 104 mmol/L (ref 98–111)
Creatinine, Ser: 0.98 mg/dL (ref 0.61–1.24)
GFR, Estimated: >60 mL/min (ref >60)
Glucose, Bld: 196 mg/dL — ABNORMAL HIGH (ref 70–99)
Potassium: 3.2 mmol/L — ABNORMAL LOW (ref 3.5–5.1)
Sodium: 133 mmol/L — ABNORMAL LOW (ref 135–145)

## 2022-08-13 LAB — SEDIMENTATION RATE: Sed Rate: 107 mm/hr — ABNORMAL HIGH (ref 0–16)

## 2022-08-13 LAB — CBC
HCT: 32.5 % — ABNORMAL LOW (ref 39.0–52.0)
Hemoglobin: 9.8 g/dL — ABNORMAL LOW (ref 13.0–17.0)
MCH: 22.5 pg — ABNORMAL LOW (ref 26.0–34.0)
MCHC: 30.2 g/dL (ref 30.0–36.0)
MCV: 74.7 fL — ABNORMAL LOW (ref 80.0–100.0)
Platelets: 402 10*3/uL — ABNORMAL HIGH (ref 150–400)
RBC: 4.35 MIL/uL (ref 4.22–5.81)
RDW: 15.2 % (ref 11.5–15.5)
WBC: 10.9 10*3/uL — ABNORMAL HIGH (ref 4.0–10.5)
nRBC: 0 % (ref 0.0–0.2)

## 2022-08-13 LAB — CBG MONITORING, ED: Glucose-Capillary: 195 mg/dL — ABNORMAL HIGH (ref 70–99)

## 2022-08-13 LAB — HIV ANTIBODY (ROUTINE TESTING W REFLEX): HIV Screen 4th Generation wRfx: NONREACTIVE

## 2022-08-13 LAB — GLUCOSE, CAPILLARY: Glucose-Capillary: 178 mg/dL — ABNORMAL HIGH (ref 70–99)

## 2022-08-13 LAB — PREALBUMIN: Prealbumin: 8 mg/dL — ABNORMAL LOW (ref 18–38)

## 2022-08-13 LAB — LACTIC ACID, PLASMA: Lactic Acid, Venous: 1.3 mmol/L (ref 0.5–1.9)

## 2022-08-13 MED ORDER — INSULIN ASPART 100 UNIT/ML IJ SOLN
0.0000 [IU] | Freq: Three times a day (TID) | INTRAMUSCULAR | Status: DC
  Administered 2022-08-13: 4 [IU] via SUBCUTANEOUS
  Administered 2022-08-14: 3 [IU] via SUBCUTANEOUS
  Administered 2022-08-14: 4 [IU] via SUBCUTANEOUS
  Administered 2022-08-15: 3 [IU] via SUBCUTANEOUS
  Administered 2022-08-15 – 2022-08-16 (×4): 4 [IU] via SUBCUTANEOUS
  Administered 2022-08-17 (×2): 3 [IU] via SUBCUTANEOUS
  Filled 2022-08-13: qty 0.2

## 2022-08-13 MED ORDER — SODIUM CHLORIDE 0.9 % IV SOLN
2.0000 g | INTRAVENOUS | Status: DC
  Administered 2022-08-13 – 2022-08-16 (×4): 2 g via INTRAVENOUS
  Filled 2022-08-13 (×4): qty 20

## 2022-08-13 MED ORDER — ACETAMINOPHEN 650 MG RE SUPP: 650.0000 mg | Freq: Four times a day (QID) | RECTAL | Status: DC | PRN

## 2022-08-13 MED ORDER — VANCOMYCIN HCL 2000 MG/400ML IV SOLN
2000.0000 mg | Freq: Once | INTRAVENOUS | Status: AC
  Administered 2022-08-13: 2000 mg via INTRAVENOUS
  Filled 2022-08-13: qty 400

## 2022-08-13 MED ORDER — SODIUM CHLORIDE 0.9 % IV SOLN
2.0000 g | Freq: Once | INTRAVENOUS | Status: AC
  Administered 2022-08-13: 2 g via INTRAVENOUS
  Filled 2022-08-13: qty 12.5

## 2022-08-13 MED ORDER — VANCOMYCIN HCL 2000 MG/400ML IV SOLN
2000.0000 mg | Freq: Two times a day (BID) | INTRAVENOUS | Status: DC
  Filled 2022-08-13: qty 400

## 2022-08-13 MED ORDER — SODIUM CHLORIDE 0.9 % IV BOLUS
1000.0000 mL | Freq: Once | INTRAVENOUS | Status: AC
  Administered 2022-08-13: 1000 mL via INTRAVENOUS

## 2022-08-13 MED ORDER — OXYCODONE HCL 5 MG PO TABS
5.0000 mg | ORAL_TABLET | ORAL | Status: DC | PRN
  Filled 2022-08-13: qty 1

## 2022-08-13 MED ORDER — METRONIDAZOLE 500 MG/100ML IV SOLN
500.0000 mg | Freq: Two times a day (BID) | INTRAVENOUS | Status: DC
  Administered 2022-08-13 – 2022-08-17 (×8): 500 mg via INTRAVENOUS
  Filled 2022-08-13 (×8): qty 100

## 2022-08-13 MED ORDER — INSULIN ASPART 100 UNIT/ML IJ SOLN
0.0000 [IU] | Freq: Every day | INTRAMUSCULAR | Status: DC
  Administered 2022-08-15 – 2022-08-21 (×3): 2 [IU] via SUBCUTANEOUS
  Filled 2022-08-13: qty 0.05

## 2022-08-13 MED ORDER — LACTATED RINGERS IV SOLN: INTRAVENOUS | Status: DC

## 2022-08-13 MED ORDER — IBUPROFEN 200 MG PO TABS
800.0000 mg | ORAL_TABLET | ORAL | Status: AC | PRN
  Administered 2022-08-13: 800 mg via ORAL
  Filled 2022-08-13: qty 4

## 2022-08-13 MED ORDER — ACETAMINOPHEN 325 MG PO TABS
650.0000 mg | ORAL_TABLET | Freq: Four times a day (QID) | ORAL | Status: DC | PRN
  Administered 2022-08-15: 650 mg via ORAL
  Filled 2022-08-13: qty 2

## 2022-08-13 MED ORDER — POTASSIUM CHLORIDE CRYS ER 20 MEQ PO TBCR
40.0000 meq | EXTENDED_RELEASE_TABLET | Freq: Once | ORAL | Status: AC
  Administered 2022-08-13: 40 meq via ORAL
  Filled 2022-08-13: qty 2

## 2022-08-13 MED ORDER — HYDRALAZINE HCL 25 MG PO TABS
25.0000 mg | ORAL_TABLET | Freq: Four times a day (QID) | ORAL | Status: DC | PRN
  Administered 2022-08-13 – 2022-08-22 (×3): 25 mg via ORAL
  Filled 2022-08-13 (×3): qty 1

## 2022-08-13 MED ORDER — ONDANSETRON HCL 4 MG PO TABS: 4.0000 mg | ORAL_TABLET | Freq: Four times a day (QID) | ORAL | Status: DC | PRN

## 2022-08-13 MED ORDER — SODIUM CHLORIDE 0.9 % IV SOLN: INTRAVENOUS | Status: DC

## 2022-08-13 MED ORDER — LORAZEPAM 0.5 MG PO TABS
0.5000 mg | ORAL_TABLET | ORAL | Status: DC | PRN
  Filled 2022-08-13: qty 1

## 2022-08-13 MED ORDER — LORAZEPAM 1 MG PO TABS: 1.0000 mg | ORAL_TABLET | ORAL | Status: DC | PRN

## 2022-08-13 MED ORDER — LACTATED RINGERS IV BOLUS
1000.0000 mL | Freq: Once | INTRAVENOUS | Status: AC
  Administered 2022-08-13: 1000 mL via INTRAVENOUS

## 2022-08-13 MED ORDER — ONDANSETRON HCL 4 MG/2ML IJ SOLN
4.0000 mg | Freq: Four times a day (QID) | INTRAMUSCULAR | Status: DC | PRN
  Administered 2022-08-15 – 2022-08-16 (×2): 4 mg via INTRAVENOUS
  Filled 2022-08-13 (×2): qty 2

## 2022-08-13 NOTE — Progress Notes (Signed)
Pharmacy Antibiotic Note  Alan Tapia. is a 47 y.o. male admitted on 08/13/2022 with suspected diabetic foot infection.  Pharmacy has been consulted for Vancomycin dosing.  Plan: Vancomycin 2000 mg IV q12h    (SCr 0.98, Vd 0.5, est AUC 476) Measure Vanc levels as needed.  Goal AUC = 400 - 550. Follow up renal function, culture results, and clinical course.   Height: 6\' 4"  (193 cm) Weight: (!) 169.6 kg (374 lb) IBW/kg (Calculated) : 86.8  Temp (24hrs), Avg:98.4 F (36.9 C), Min:98.3 F (36.8 C), Max:98.5 F (36.9 C)  Recent Labs  Lab 08/13/22 1500  WBC 10.9*  CREATININE 0.98  LATICACIDVEN 1.3    Estimated Creatinine Clearance: 158 mL/min (by C-G formula based on SCr of 0.98 mg/dL).    Allergies  Allergen Reactions   Benadryl [Diphenhydramine] Swelling    Pt reports cannot take pill form of benadryl    Antimicrobials this admission: 4/3 Vancomycin >> 4/3 Cefepime x1 4/3 metronidazole>> (4/10) 4/3 Ceftriaxone >> (4/10)  Dose adjustments this admission:   Microbiology results: 4/3 BCx:   Thank you for allowing pharmacy to be a part of this patient's care.  Gretta Arab PharmD, BCPS [facility] main pharmacy [phone] 08/13/2022 5:40 PM

## 2022-08-13 NOTE — ED Notes (Signed)
Only able to get 2 culture bottles from IV site. Second attempt at blood draw unsuccessful.

## 2022-08-13 NOTE — ED Notes (Signed)
Pt broke out in a rash on the right side of his neck almost immediately after starting Vancomycin. Medication stopped at this time.

## 2022-08-13 NOTE — Sepsis Progress Note (Signed)
eLink is following this Code Sepsis. °

## 2022-08-13 NOTE — ED Notes (Signed)
Pt transported to MRI 

## 2022-08-13 NOTE — H&P (Signed)
History and Physical    Alan Tapia. JB:[id number] DOB: 02/28/1976 DOA: 08/13/2022  I have briefly reviewed the patient's prior medical records in [place]  PCP: Patient, No Pcp Per  Patient coming from: home  Chief Complaint: infection of left foot  HPI: Alan Tapia. is a 47 y.o. male with medical history significant of morbid obesity, DM, HTN who comes in complaining of worsening infection of his left foot with draining wound.  For the last 2 years patient has been having issues with his foot and getting debridements from Dr. Gaylyn Cheers in [place].  Last debridement was 06/10/22.  He was advised at that time to get a PICC line and was given vanc/zosyn under ID's care.  States his PICC line fell out about 4 weeks ago in [place] but due to a family emergency he came to [facility]. He has been unable to continue on Vancomycin for the past 4 weeks. Patient reports increase in swelling in the left leg and clear/yellow discharge from the foot.    In the ER, MRI was done that showed septic arthritis, ? Abscess and continued/worsening osteomyelitis. Labs show an elevated WBC count, low K and increase sed and glucose levels. Orthopedics Education officer, community) was consulted and patient to be tx to [facility] for possible surgery in the AM.     Review of Systems: As per HPI otherwise 10 point review of systems negative.   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 He has never used smokeless tobacco. He reports that he does not drink alcohol and does not use drugs.  Allergies  Allergen Reactions   Benadryl [Diphenhydramine] Swelling    Pt reports cannot take pill form of benadryl    Family History  Problem Relation Age of Onset   Arthritis Mother    Allergies Father    Hypertension Father    Diabetes Father      Prior to Admission medications   Medication Sig Start Date End Date Taking? Authorizing Provider  acetaminophen (TYLENOL) 650 MG CR tablet Take 650 mg by mouth every 8 (eight) hours as needed for pain. Take 2 pills once daily    [provider]  diazepam (VALIUM) 5 MG tablet take 1 tablet by mouth twice a day for 5 days 03/31/15   [provider]  doxycycline (VIBRAMYCIN) 100 MG capsule Take 1 capsule (100 mg total) by mouth 2 (two) times daily. 09/22/16   Barnet Glasgow, NP  HYDROcodone-acetaminophen (NORCO) 10-325 MG tablet Take 1 tablet by mouth at bedtime as needed. 04/10/15   Henson, Vickie L, NP-C  ibuprofen (ADVIL,MOTRIN) 200 MG tablet Take 800 mg by mouth daily.    [provider]  naproxen (NAPROSYN) 500 MG tablet Take 1 tablet (500 mg total) by mouth 2 (two) times daily. 03/30/15   Paulina Fusi, MD  Naproxen Sodium (ALEVE PO) Take by mouth.    [provider]    Physical Exam: Vitals:   08/13/22 1206 08/13/22 1315 08/13/22 1520 08/13/22 1616  BP:  (!) 169/97 (!) 183/98   Pulse:  99 100   Resp:  19 19   Temp:    98.3 F (36.8 C)  TempSrc:    Oral  SpO2:  99% 100%   Weight: (!) 169.6 kg     Height:  6\' 4"  (1.93 m)         Constitutional: NAD, calm, comfortable Eyes: PERRL, lids and conjunctivae normal ENMT: Mucous membranes are moist. Posterior pharynx clear of any exudate or lesions.Normal dentition.  Neck: normal, supple, no masses, no thyromegaly Respiratory: clear to auscultation bilaterally, no wheezing, no crackles. Normal respiratory effort. No accessory muscle use.  Cardiovascular: Regular rate and rhythm, no murmurs / rubs / gallops. + chronic skin changes  Abdomen: no tenderness, no masses palpated. Bowel sounds positive.  Musculoskeletal:obese Skin:       Neurologic: CN 2-12 grossly intact. Strength 5/5 in all 4.  Psychiatric: Normal judgment and insight. Alert and oriented x 3. Normal mood.   Labs on  Admission: I have personally reviewed following labs and imaging studies  CBC: Recent Labs  Lab 08/13/22 1500  WBC 10.9*  HGB 9.8*  HCT 32.5*  MCV 74.7*  PLT [id number]*   Basic Metabolic Panel: Recent Labs  Lab 08/13/22 1500  NA 133*  K 3.2*  CL 104  CO2 22  GLUCOSE 196*  BUN 7  CREATININE 0.98  CALCIUM 7.5*   GFR: Estimated Creatinine Clearance: 158 mL/min (by C-G formula based on SCr of 0.98 mg/dL). Liver Function Tests: No results for input(s): "AST", "ALT", "ALKPHOS", "BILITOT", "PROT", "ALBUMIN" in the last 168 hours. No results for input(s): "LIPASE", "AMYLASE" in the last 168 hours. No results for input(s): "AMMONIA" in the last 168 hours. Coagulation Profile: No results for input(s): "INR", "PROTIME" in the last 168 hours. Cardiac Enzymes: No results for input(s): "CKTOTAL", "CKMB", "CKMBINDEX", "TROPONINI" in the last 168 hours. BNP (last 3 results) No results for input(s): "PROBNP" in the last 8760 hours. HbA1C: No results for input(s): "HGBA1C" in the last 72 hours. CBG: No results for input(s): "GLUCAP" in the last 168 hours. Lipid Profile: No results for input(s): "CHOL", "HDL", "LDLCALC", "TRIG", "CHOLHDL", "LDLDIRECT" in the last 72 hours. Thyroid Function Tests: No results for input(s): "TSH", "T4TOTAL", "FREET4", "T3FREE", "THYROIDAB" in the last 72 hours. Anemia Panel: No results for input(s): "VITAMINB12", "FOLATE", "FERRITIN", "TIBC", "IRON", "RETICCTPCT" in the last 72 hours. Urine analysis: No results found for: "COLORURINE", "APPEARANCEUR", "LABSPEC", "PHURINE", "GLUCOSEU", "HGBUR", "BILIRUBINUR", "KETONESUR", "PROTEINUR", "UROBILINOGEN", "NITRITE", "LEUKOCYTESUR"   Radiological Exams on Admission: MRI Left foot without contrast  Result Date: 08/13/2022 CLINICAL DATA:  Soft tissue infection suspected, foot, xray done EXAM: MRI OF THE LEFT FOOT WITHOUT CONTRAST TECHNIQUE: Multiplanar, multisequence MR imaging of the left foot was performed. No  intravenous contrast was administered. 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 There is prominent adjacent heterotopic ossification and callus formation, demonstrating increased edema signal and some areas of low T1 signal. No significant marrow abnormality involving the second or third rays. There is marrow edema throughout the fourth toe phalanges and fourth metatarsal. There is confluent low T1 marrow signal in the periarticular fourth toe proximal phalanx and metatarsal head at the MTP joint with and MTP joint effusion. There is marrow edema and confluent low T1 signal throughout the fifth toe proximal phalanx and fifth metatarsal. There is extensive marrow edema within the plantar aspect of the cuboid with preserved T1 marrow signal. Very mild periarticular middle and lateral cuneiforms marrow edema at the TMT joints, with preserved T1 marrow signal, likely reactive. Ligaments Intact Lisfranc ligament. Muscles and Tendons Denervation changes of the foot musculature. Soft tissues Extensive skin thickening and soft tissue swelling of the foot. There is  a large lateral plantar wound with notable tract extending to the base of the fifth metatarsal. There is underlying confluent fluid and soft tissue gas related to the ulcer/wound measuring up to 1.0 cm short axis and extends to the surface of the base of the fifth metatarsal. IMPRESSION: Large plantar foot wound with lateral midfoot tract extending to the base of the fifth metatarsal. Underlying confluent fluid in the lateral midfoot measuring up to 1.0 cm short axis which could reflect a fluid collection/abscess or coalescent edema, correlate with drainage. Osteomyelitis of the fifth toe proximal phalanx and of the entire fifth metatarsal. Septic arthritis and periarticular osteomyelitis of the fourth toe MTP joint. Marrow edema involvement  includes all of the fourth toe phalanges and the entirety of the fourth metatarsal. 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 Marrow edema within the plantar aspect of the cuboid which could reflect reactive marrow change or early osteomyelitis. Extensive soft tissue swelling of the foot. Electronically Signed   By: Maurine Simmering M.D.   On: 08/13/2022 14:56   DG Foot Complete Left  Result Date: 08/13/2022 CLINICAL DATA:  Foot wound. Osteomyelitis. None cellulitis and osteomyelitis. Nonhealing ulcer on bottom of left foot for several months. Patient was being treated in [place] with IV antibiotics through a PICC line, and had discussion about proceeding to wound VAC. Patient reports he had to move to [place] suddenly and has not had any IV antibiotics for 4 weeks. EXAM: LEFT FOOT - COMPLETE 3+ VIEW COMPARISON:  Left foot radiographs 06/06/2022 FINDINGS: There is high-grade soft tissue swelling. Postsurgical changes are again seen of amputation of the first digit to the mid metatarsal shaft. Unchanged chronic bone formation abutting the distal lateral aspect of the first metatarsal. There is a new soft tissue ulcer lateral to the proximal 50% of the fifth metatarsal shaft. Note is made that subcutaneous air was previously seen lateral to the distal fifth metatarsal, there were findings concerning for osteomyelitis on both sides of the fifth metatarsophalangeal joint. This distal fifth metatarsal and proximal aspect of the proximal phalanx cortical erosion has progressed. There is diffuse periosteal reaction along the fifth metatarsal shaft. There is also new mild erosion at the lateral base of the fifth metatarsal. There is new mild-to-moderate erosion at the distal fourth metatarsal and proximal lateral aspect of the  fourth proximal phalanx. Moderate to large plantar and posterior calcaneal heel spurs. 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 IMPRESSION: 1. High-grade soft tissue swelling. 2. New soft tissue ulcer lateral to the proximal 50% of the fifth metatarsal shaft. 3. Progression of osteomyelitis of the fifth digit distal aspect of the metatarsal and proximal aspect of the proximal phalanx. New erosion at the lateral base of the fifth metatarsal. 4. New mild-to-moderate erosion at the distal fourth metatarsal and proximal lateral aspect of the fourth proximal phalanx. 5. These findings are highly suspicious for progressive osteomyelitis. Electronically Signed   By: Yvonne Kendall M.D.   On: 08/13/2022 13:27      Assessment/Plan Principal Problem:   Osteomyelitis Active Problems:   Diabetic foot infection   Uncontrolled type 2 diabetes mellitus with hyperglycemia, with long-term current use of insulin   Hypertensive urgency   Hypokalemia    Osteomyelitis + abscess -MRI shows: fluid collection/abscess or coalescent edema/Osteomyelitis of the fifth toe proximal phalanx and of the entire fifth  metatarsal.  Septic arthritis and periarticular osteomyelitis of the fourth toe MTP joint. Marrow edema involvement includes all of the fourth toe phalanges and the entirety of the fourth metatarsal. 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" reaction to vancomycin so will slow down infusion (rash along neck and chest)    Uncontrolled DM -does not appear to be on any medications per our [facility]-- OSH shows on humalog 15 TID and glargine 20 -SSI -update HgbA1c (67 days ago was 12.3)   HTN -does not appear to be on any medications per our [facility] but upon chart review from OSH- norvasc 10,  losartan 100 -will start PRN for now and treat pain and then start PO regimen if remains elevated  Hypokalemia -replete  Obesity Estimated body mass index is 45.52 kg/m as calculated from the following:   Height as of this encounter: 6\' 4"  (1.93 m).   Weight as of this encounter: 169.6 kg.   DVT prophylaxis: lovenox  Code Status: full  Family Communication: at bedside Disposition Plan: to [facility] for evaluation by Ortho Consults called: ER to call Dr. Caprice Beaver   Admission status: inpt   Geradine Girt [facility]   How to contact the [facility] Attending or Consulting provider [place] or covering provider during after hours [place], for this patient?  Check the care team in [facility] and look for a) attending/consulting TR[facility] provider listed and b) the [facility] team listed Log into [email or web address] and use [place]'s universal password to access. If you do not have the password, please contact the hospital operator. Locate the [facility] provider you are looking for under [facility] and page to a number that you can be directly reached. If you still have difficulty reaching the provider, please page the [facility] (Director on Call) for the Hospitalists listed on amion for assistance.  08/13/2022, 4:48 PM

## 2022-08-13 NOTE — ED Provider Notes (Signed)
[facility] Provider Note   CSN: [id number] Arrival date & time: 08/13/22  1135     History  Chief Complaint  Patient presents with   Wound Infection    Alan Lary. is a 47 y.o. male.  HPI   47 year old male with medical history significant for chronic osteomyelitis, DM, HTN previously had been on a PICC line outpatient but the PICC line came out 1 month ago and he has not been on antibiotic therapy for the past month he presents to the emergency department for wound evaluation.  The patient complains of known cellulitis and osteomyelitis and a nonhealing ulceration on the bottom of his left foot that is been present for the past several months.  He has had these issues for the past 2 years and had been getting debridements in [place] and had been intoxicated wound VAC.  He moved to [place] and has not received care for a month after his PICC line came out.  He has been on no antibiotic therapy.  He reports increasing pain and swelling and yellow discharge from the left foot.  Home Medications Prior to Admission medications   Medication Sig Start Date End Date Taking? Authorizing Provider  acetaminophen (TYLENOL) 650 MG CR tablet Take 650 mg by mouth every 8 (eight) hours as needed for pain. Take 2 pills once daily    [provider]  diazepam (VALIUM) 5 MG tablet take 1 tablet by mouth twice a day for 5 days 03/31/15   [provider]  doxycycline (VIBRAMYCIN) 100 MG capsule Take 1 capsule (100 mg total) by mouth 2 (two) times daily. 09/22/16   Barnet Glasgow, NP  HYDROcodone-acetaminophen (NORCO) 10-325 MG tablet Take 1 tablet by mouth at bedtime as needed. 04/10/15   Henson, Vickie L, NP-C  ibuprofen (ADVIL,MOTRIN) 200 MG tablet Take 800 mg by mouth daily.    [provider]  naproxen (NAPROSYN) 500 MG tablet Take 1 tablet (500 mg total) by mouth 2 (two) times daily. 03/30/15   Paulina Fusi, MD   Naproxen Sodium (ALEVE PO) Take by mouth.    [provider]      Allergies    Benadryl [diphenhydramine]    Review of Systems   Review of Systems  All other systems reviewed and are negative.   Physical Exam Updated Vital Signs BP (!) 176/86   Pulse 100   Temp 98.4 F (36.9 C)   Resp 19   Ht 6\' 4"  (1.93 m)   Wt (!) 169.6 kg   SpO2 99%   BMI 45.52 kg/m  Physical Exam Vitals and nursing note reviewed.  Constitutional:      General: He is not in acute distress.    Appearance: He is well-developed.  HENT:     Head: Normocephalic and atraumatic.  Eyes:     Conjunctiva/sclera: Conjunctivae normal.  Cardiovascular:     Rate and Rhythm: Normal rate and regular rhythm.     Heart sounds: No murmur heard. Pulmonary:     Effort: Pulmonary effort is normal. No respiratory distress.     Breath sounds: Normal breath sounds.  Abdominal:     Palpations: Abdomen is soft.     Tenderness: There is no abdominal tenderness.  Musculoskeletal:        General: No swelling.     Cervical back: Neck supple.     Comments: Significant swelling, nonhealing ulcerations to the bottom of the left foot, purulence and foul  smelling, skin thickening, erythema, tenderness to palpation  Skin:    General: Skin is warm and dry.     Capillary Refill: Capillary refill takes less than 2 seconds.  Neurological:     Mental Status: He is alert.  Psychiatric:        Mood and Affect: Mood normal.        ED Results / Procedures / Treatments   Labs (all labs ordered are listed, but only abnormal results are displayed) Labs Reviewed  CBC - Abnormal; Notable for the following components:      Result Value   WBC 10.9 (*)    Hemoglobin 9.8 (*)    HCT 32.5 (*)    MCV 74.7 (*)    MCH 22.5 (*)    Platelets 402 (*)    All other components within normal limits  BASIC METABOLIC PANEL - Abnormal; Notable for the following components:   Sodium 133 (*)    Potassium 3.2 (*)    Glucose, Bld 196  (*)    Calcium 7.5 (*)    All other components within normal limits  SEDIMENTATION RATE - Abnormal; Notable for the following components:   Sed Rate 107 (*)    All other components within normal limits  CBG MONITORING, ED - Abnormal; Notable for the following components:   Glucose-Capillary 195 (*)    All other components within normal limits  CULTURE, BLOOD (ROUTINE X 2)  CULTURE, BLOOD (ROUTINE X 2)  LACTIC ACID, PLASMA  C-REACTIVE PROTEIN  HEMOGLOBIN A1C    EKG None  Radiology MRI Left foot without contrast  Result Date: 08/13/2022 CLINICAL DATA:  Soft tissue infection suspected, foot, xray done EXAM: MRI OF THE LEFT FOOT WITHOUT CONTRAST TECHNIQUE: Multiplanar, multisequence MR imaging of the left foot was performed. No intravenous contrast was administered. 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 There is prominent adjacent heterotopic ossification and callus formation, demonstrating increased edema signal and some areas of low T1 signal. No significant marrow abnormality involving the second or third rays. There is marrow edema throughout the fourth toe phalanges and fourth metatarsal. There is confluent low T1 marrow signal in the periarticular fourth toe proximal phalanx and metatarsal head at the MTP joint with and MTP joint effusion. There is marrow edema and confluent low T1 signal throughout the fifth toe proximal phalanx and fifth metatarsal. There is extensive marrow edema within the plantar aspect of the cuboid with preserved T1 marrow signal. Very mild periarticular middle and lateral cuneiforms marrow edema at the TMT joints, with preserved T1 marrow signal, likely reactive. Ligaments Intact Lisfranc ligament. Muscles and Tendons Denervation changes of the foot musculature. Soft tissues Extensive skin thickening and soft tissue swelling of the foot. There is  a large lateral plantar wound with notable tract extending to the base of the fifth metatarsal. There is underlying confluent fluid and soft tissue gas related to the ulcer/wound measuring up to 1.0 cm short axis and extends to the surface of the base of the fifth metatarsal. IMPRESSION: Large plantar foot wound with lateral midfoot tract extending to the base of the fifth metatarsal. Underlying confluent fluid in the lateral midfoot measuring up to 1.0 cm short axis which could reflect a fluid collection/abscess or coalescent edema, correlate with drainage. Osteomyelitis of the fifth toe proximal phalanx and of the entire fifth metatarsal. Septic arthritis and periarticular osteomyelitis of the fourth toe MTP joint. Marrow edema  involvement includes all of the fourth toe phalanges and the entirety of the fourth metatarsal. 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 Marrow edema within the plantar aspect of the cuboid which could reflect reactive marrow change or early osteomyelitis. Extensive soft tissue swelling of the foot. Electronically Signed   By: Maurine Simmering M.D.   On: 08/13/2022 14:56   DG Foot Complete Left  Result Date: 08/13/2022 CLINICAL DATA:  Foot wound. Osteomyelitis. None cellulitis and osteomyelitis. Nonhealing ulcer on bottom of left foot for several months. Patient was being treated in [place] with IV antibiotics through a PICC line, and had discussion about proceeding to wound VAC. Patient reports he had to move to [place] suddenly and has not had any IV antibiotics for 4 weeks. EXAM: LEFT FOOT - COMPLETE 3+ VIEW COMPARISON:  Left foot radiographs 06/06/2022 FINDINGS: There is high-grade soft tissue swelling. Postsurgical changes are again seen of amputation of the first digit to the mid metatarsal  shaft. Unchanged chronic bone formation abutting the distal lateral aspect of the first metatarsal. There is a new soft tissue ulcer lateral to the proximal 50% of the fifth metatarsal shaft. Note is made that subcutaneous air was previously seen lateral to the distal fifth metatarsal, there were findings concerning for osteomyelitis on both sides of the fifth metatarsophalangeal joint. This distal fifth metatarsal and proximal aspect of the proximal phalanx cortical erosion has progressed. There is diffuse periosteal reaction along the fifth metatarsal shaft. There is also new mild erosion at the lateral base of the fifth metatarsal. There is new mild-to-moderate erosion at the distal fourth metatarsal and proximal lateral aspect of the fourth proximal phalanx. Moderate to large plantar and posterior calcaneal heel spurs. 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 IMPRESSION: 1. High-grade soft tissue swelling. 2. New soft tissue ulcer lateral to the proximal 50% of the fifth metatarsal shaft. 3. Progression of osteomyelitis of the fifth digit distal aspect of the metatarsal and proximal aspect of the proximal phalanx. New erosion at the lateral base of the fifth metatarsal. 4. New mild-to-moderate erosion at the distal fourth metatarsal and proximal lateral aspect of the fourth proximal phalanx. 5. These findings are highly suspicious for progressive osteomyelitis. Electronically Signed   By: Yvonne Kendall M.D.   On: 08/13/2022 13:27    Procedures .Critical Care  Performed by: Regan Lemming, MD Authorized by: Regan Lemming, MD   Critical care provider statement:    Critical care time (minutes):  30   Critical care was necessary to treat or prevent imminent or life-threatening deterioration of the following conditions:  Sepsis   Critical care was time spent personally by me on the following activities:  Development of treatment plan with patient or surrogate, discussions with consultants,  evaluation of patient's response to treatment, examination of patient, ordering and review of laboratory studies, ordering and review of radiographic studies, ordering and performing treatments and interventions, pulse oximetry, re-evaluation of patient's condition and review of old charts   Care discussed with: admitting provider       Medications Ordered in ED Medications  sodium chloride 0.9 % bolus 1,000 mL (0 mLs Intravenous Stopped 08/13/22 1618)    And  0.9 %  sodium chloride infusion (has no administration in time range)  metroNIDAZOLE (FLAGYL) IVPB 500 mg (0 mg Intravenous Stopped 08/13/22 1617)  LORazepam (ATIVAN) tablet  0.5 mg (has no administration in time range)  LORazepam (ATIVAN) tablet 1 mg (has no administration in time range)  lactated ringers infusion (has no administration in time range)  cefTRIAXone (ROCEPHIN) 2 g in sodium chloride 0.9 % 100 mL IVPB (has no administration in time range)  potassium chloride SA (KLOR-CON M) CR tablet 40 mEq (has no administration in time range)  insulin aspart (novoLOG) injection 0-20 Units (4 Units Subcutaneous Given 08/13/22 1745)  insulin aspart (novoLOG) injection 0-5 Units (has no administration in time range)  hydrALAZINE (APRESOLINE) tablet 25 mg (25 mg Oral Given 08/13/22 1757)  vancomycin (VANCOREADY) IVPB 2000 mg/400 mL (has no administration in time range)  vancomycin (VANCOREADY) IVPB 2000 mg/400 mL (0 mg Intravenous Stopped 08/13/22 1626)  ceFEPIme (MAXIPIME) 2 g in sodium chloride 0.9 % 100 mL IVPB (0 g Intravenous Stopped 08/13/22 1406)  lactated ringers bolus 1,000 mL (0 mLs Intravenous Stopped 08/13/22 1752)    ED Course/ Medical Decision Making/ A&P                             Medical Decision Making Amount and/or Complexity of Data Reviewed Radiology: ordered.  Risk Prescription drug management. Decision regarding hospitalization.    47 year old male with medical history significant for chronic osteomyelitis, DM, HTN  previously had been on a PICC line outpatient but the PICC line came out 1 month ago and he has not been on antibiotic therapy for the past month he presents to the emergency department for wound evaluation.  The patient complains of known cellulitis and osteomyelitis and a nonhealing ulceration on the bottom of his left foot that is been present for the past several months.  He has had these issues for the past 2 years and had been getting debridements in [place] and had been intoxicated wound VAC.  He moved to [place] and has not received care for a month after his PICC line came out.  He has been on no antibiotic therapy.  He reports increasing pain and swelling and yellow discharge from the left foot.  On arrival, the patient was afebrile, temperature 98.5, tachycardic heart rate 104, not tachypneic, BP 176/100, saturating 98% on room air.  Sinus tachycardia noted on cardiac telemetry.  Concern for sepsis from chronic osteomyelitis in the setting of lack of wound healing.  Considered septic arthritis, osteomyelitis, cellulitis.  Given the duration of infection, lower concern for necrotizing fasciitis.   IV access was obtained and the patient was started on IV vancomycin, IV cefepime and IV Flagyl, he was given an IV fluid bolus of 1 L LR.  An x-ray of the left foot was concerning for osteomyelitis.  IMPRESSION:  1. High-grade soft tissue swelling.  2. New soft tissue ulcer lateral to the proximal 50% of the fifth  metatarsal shaft.  3. Progression of osteomyelitis of the fifth digit distal aspect of  the metatarsal and proximal aspect of the proximal phalanx. New  erosion at the lateral base of the fifth metatarsal.  4. New mild-to-moderate erosion at the distal fourth metatarsal and  proximal lateral aspect of the fourth proximal phalanx.  5. These findings are highly suspicious for progressive  osteomyelitis.    Code sepsis was called.  Blood cultures were collected and pending, CBC  revealed a leukocytosis to 10.9, anemia to 9.8, ESR elevated to 107, BMP with hypokalemia to 3.2, renal function normal.    MRI imaging of the left foot  revealed the following: IMPRESSION:  Large plantar foot wound with lateral midfoot tract extending to the  base of the fifth metatarsal. Underlying confluent fluid in the  lateral midfoot measuring up to 1.0 cm short axis which could  reflect a fluid collection/abscess or coalescent edema, correlate  with drainage.    Osteomyelitis of the fifth toe proximal phalanx and of the entire  fifth metatarsal.    Septic arthritis and periarticular osteomyelitis of the fourth toe  MTP joint. Marrow edema involvement includes all of the fourth toe  phalanges and the entirety of the fourth metatarsal.    Prior partial first ray amputation with prominent callus/heterotopic  ossification, which demonstrates marrow edema and areas of low T1  signal which could reflect chronic reactive postsurgical/healing  changes or early osteomyelitis.    Marrow edema within the plantar aspect of the cuboid which could  reflect reactive marrow change or early osteomyelitis.    Extensive soft tissue swelling of the foot.    On-call orthopedics was consulted, Dr. Doran Durand will see the patient in consultation, Dr. Eulogio Bear of hospitalist medicine was consulted for admission, plan for likely [facility] admission with surgical management pending.  On repeat assessment, the patient was vitally stable, mildly tachycardic, hemodynamically stable status post fluid resuscitation and appears overall volume resuscitated, lactic acid 1.3, overall stable at time of admission.    Final Clinical Impression(s) / ED Diagnoses Final diagnoses:  Osteomyelitis of left foot, unspecified type  Sepsis, due to unspecified organism, unspecified whether acute organ dysfunction present    Rx / DC Orders ED Discharge Orders     None         Regan Lemming, MD 08/13/22  1844

## 2022-08-13 NOTE — ED Notes (Signed)
Vancomycin rate changed to 100 ml/hr per pharmacy. Rash came back up on pt neck, spreading down, pt also began to vomit. Infusion stopped again pt request

## 2022-08-13 NOTE — ED Triage Notes (Signed)
Patient is here for evaluation of infection in the left foot. Pt reports that he was being treated for bone infection that was diagnosed in [place] in November 2023. States he had a PICC line that has fallen out, about 4 weeks ago and he has been unable to continue on Vancomycin for  the past 4 weeks. Patient reports increase in swelling in the left leg and clear/yellow discharge from the foot.

## 2022-08-13 NOTE — ED Provider Triage Note (Addendum)
Emergency Medicine Provider Triage Evaluation Note  Alan Tapia. , a 47 y.o. male  was evaluated in triage.  Pt complains of known cellulitis, osteomyelitis, nonhealing ulcer on bottom of left foot for several months.  Patient was being treated in [place] with medication, bandage changes, IV antibiotics through PICC line, and had discussion about proceeding to wound VAC.  Patient reports that he had to move to [place] suddenly and has not had any IV antibiotics for 4 weeks. Hx of lymphedema,  Diabetes.  Review of Systems  Positive: Foot wound, chills Negative: fever  Physical Exam  BP (!) 176/100 (BP Location: Left Arm)   Pulse (!) 104   Temp 98.5 F (36.9 C) (Oral)   Resp 20   Ht 6\' 4"  (1.93 m)   Wt (!) 169.6 kg   SpO2 98%   BMI 45.52 kg/m  Gen:   Awake, no distress   Resp:  Normal effort  MSK:   Moves extremities without difficulty  Other:  See photo -- poorly healed foot wound, thready dp/pt pulses, intact cap refill    Medical Decision Making  Medically screening exam initiated at 12:28 PM.  Appropriate orders placed.  Alan Tapia. was informed that the remainder of the evaluation will be completed by another provider, this initial triage assessment does not replace that evaluation, and the importance of remaining in the ED until their evaluation is complete.  Workup initiated in triage    Anselmo Pickler, PA-C 08/13/22 1229    Anselmo Pickler, [place] 08/13/22 1230

## 2022-08-13 NOTE — Progress Notes (Signed)
A consult was received from an ED physician for vancomycin & cefepime per pharmacy dosing.  The patient's profile has been reviewed for ht/wt/allergies/indication/available labs.    A one time order has been placed for vancomycin 2 gm & cefepime 2 gm.    Further antibiotics/pharmacy consults should be ordered by admitting physician if indicated.                       Thank you,  Eudelia Bunch, Pharm.D Use secure chat for questions 08/13/2022 1:15 PM

## 2022-08-14 ENCOUNTER — Encounter (HOSPITAL_COMMUNITY)
Admission: EM | Disposition: A | Discharge status: Rehabilitation Facility | Payer: Self-pay | Source: Home / Self Care | Attending: Internal Medicine

## 2022-08-14 ENCOUNTER — Inpatient Hospital Stay (HOSPITAL_COMMUNITY): Payer: Medicaid - Out of State | Admitting: Anesthesiology

## 2022-08-14 DIAGNOSIS — M86172 Other acute osteomyelitis, left ankle and foot: Secondary | ICD-10-CM | POA: Diagnosis not present

## 2022-08-14 LAB — GLUCOSE, CAPILLARY
Glucose-Capillary: 129 mg/dL — ABNORMAL HIGH (ref 70–99)
Glucose-Capillary: 132 mg/dL — ABNORMAL HIGH (ref 70–99)
Glucose-Capillary: 154 mg/dL — ABNORMAL HIGH (ref 70–99)
Glucose-Capillary: 134 mg/dL — ABNORMAL HIGH (ref 70–99)
Glucose-Capillary: 129 mg/dL — ABNORMAL HIGH (ref 70–99)

## 2022-08-14 LAB — CBC
HCT: 26.8 % — ABNORMAL LOW (ref 39.0–52.0)
Hemoglobin: 8.7 g/dL — ABNORMAL LOW (ref 13.0–17.0)
MCH: 23.3 pg — ABNORMAL LOW (ref 26.0–34.0)
MCHC: 32.5 g/dL (ref 30.0–36.0)
MCV: 71.8 fL — ABNORMAL LOW (ref 80.0–100.0)
Platelets: 367 10*3/uL (ref 150–400)
RBC: 3.73 MIL/uL — ABNORMAL LOW (ref 4.22–5.81)
RDW: 15.3 % (ref 11.5–15.5)
WBC: 8.7 10*3/uL (ref 4.0–10.5)
nRBC: 0 % (ref 0.0–0.2)

## 2022-08-14 LAB — BASIC METABOLIC PANEL
Anion gap: 5 (ref 5–15)
BUN: 9 mg/dL (ref 6–20)
CO2: 21 mmol/L — ABNORMAL LOW (ref 22–32)
Calcium: 7.2 mg/dL — ABNORMAL LOW (ref 8.9–10.3)
Chloride: 104 mmol/L (ref 98–111)
Creatinine, Ser: 1.16 mg/dL (ref 0.61–1.24)
GFR, Estimated: >60 mL/min (ref >60)
Glucose, Bld: 155 mg/dL — ABNORMAL HIGH (ref 70–99)
Potassium: 3.1 mmol/L — ABNORMAL LOW (ref 3.5–5.1)
Sodium: 130 mmol/L — ABNORMAL LOW (ref 135–145)

## 2022-08-14 LAB — SURGICAL PCR SCREEN
MRSA, PCR: NEGATIVE
Staphylococcus aureus: NEGATIVE

## 2022-08-14 LAB — HEMOGLOBIN A1C
Hgb A1c MFr Bld: 10.3 % — ABNORMAL HIGH (ref 4.8–5.6)
Mean Plasma Glucose: 249 mg/dL

## 2022-08-14 SURGERY — AMPUTATION BELOW KNEE
Anesthesia: General | Site: Knee | Laterality: Left

## 2022-08-14 MED ORDER — POTASSIUM CHLORIDE 10 MEQ/100ML IV SOLN
10.0000 meq | INTRAVENOUS | Status: AC
  Administered 2022-08-14 (×2): 10 meq via INTRAVENOUS
  Filled 2022-08-14 (×2): qty 100

## 2022-08-14 MED ORDER — CEFAZOLIN IN SODIUM CHLORIDE 3-0.9 GM/100ML-% IV SOLN: 3.0000 g | INTRAVENOUS | Status: DC

## 2022-08-14 MED ORDER — CHLORHEXIDINE GLUCONATE 4 % EX LIQD
60.0000 mL | Freq: Once | CUTANEOUS | Status: DC
  Filled 2022-08-14: qty 15

## 2022-08-14 MED ORDER — LINEZOLID 600 MG/300ML IV SOLN
600.0000 mg | Freq: Two times a day (BID) | INTRAVENOUS | Status: DC
  Administered 2022-08-14 – 2022-08-16 (×6): 600 mg via INTRAVENOUS
  Filled 2022-08-14 (×7): qty 300

## 2022-08-14 MED ORDER — HYDROCORTISONE 1 % EX CREA
1.0000 | TOPICAL_CREAM | Freq: Two times a day (BID) | CUTANEOUS | Status: DC | PRN
  Filled 2022-08-14: qty 28

## 2022-08-14 MED ORDER — ADULT MULTIVITAMIN W/MINERALS CH
1.0000 | ORAL_TABLET | Freq: Every day | ORAL | Status: DC
  Administered 2022-08-14 – 2022-08-22 (×9): 1 via ORAL
  Filled 2022-08-14 (×9): qty 1

## 2022-08-14 MED ORDER — POVIDONE-IODINE 10 % EX SWAB
2.0000 | Freq: Once | CUTANEOUS | Status: AC
  Administered 2022-08-15: 2 via TOPICAL

## 2022-08-14 MED ORDER — SODIUM CHLORIDE 0.9 % IV SOLN: INTRAVENOUS | Status: DC

## 2022-08-14 NOTE — Anesthesia Preprocedure Evaluation (Deleted)
Anesthesia Evaluation  Patient identified by MRN, date of birth, ID band Patient awake    Reviewed: Allergy & Precautions, H&P , NPO status , Patient's Chart, lab work & pertinent test results  Airway Mallampati: II  TM Distance: >3 FB Neck ROM: Full    Dental no notable dental hx.    Pulmonary neg pulmonary ROS   Pulmonary exam normal breath sounds clear to auscultation       Cardiovascular hypertension, Normal cardiovascular exam Rhythm:Regular Rate:Normal     Neuro/Psych negative neurological ROS  negative psych ROS   GI/Hepatic negative GI ROS, Neg liver ROS,,,  Endo/Other  diabetes  Morbid obesity  Renal/GU negative Renal ROS  negative genitourinary   Musculoskeletal negative musculoskeletal ROS (+)    Abdominal   Peds negative pediatric ROS (+)  Hematology negative hematology ROS (+)   Anesthesia Other Findings   Reproductive/Obstetrics negative OB ROS                             Anesthesia Physical Anesthesia Plan  ASA: 3  Anesthesia Plan: General   Post-op Pain Management: Dilaudid IV   Induction: Intravenous  PONV Risk Score and Plan: 2 and Ondansetron and Treatment may vary due to age or medical condition  Airway Management Planned: LMA and Oral ETT  Additional Equipment:   Intra-op Plan:   Post-operative Plan: Extubation in OR  Informed Consent: I have reviewed the patients History and Physical, chart, labs and discussed the procedure including the risks, benefits and alternatives for the proposed anesthesia with the patient or authorized representative who has indicated his/her understanding and acceptance.     Dental advisory given  Plan Discussed with: CRNA and Surgeon  Anesthesia Plan Comments: (Has had #5 LMA's previously)       Anesthesia Quick Evaluation

## 2022-08-14 NOTE — Progress Notes (Signed)
PROGRESS NOTE  Alan Tapia.  DOB: 1975-10-19  PCP: Patient, No Pcp Per [id number]  DOA: 08/13/2022  LOS: 1 day  Hospital Day: 2  Brief narrative: Alan Tapia. is a 47 y.o. male with PMH significant for morbid obesity, uncontrolled DM, HTN who has a 62-month history of a left foot ulcer for which she was seeing a podiatrist weekly for debridement at [place], last debridement 06/10/2022. He recently moved to [place] due to a family emergency but he was traveling back-and-forth to see the same podiatrist in [place].  He also had a PICC line and was receiving vancomycin infusions.  PICC line fell out before completion of the antibiotic course for which he did not follow-up.   4/3, presented to ED with complaint of progressive swelling, and clear yellow discharge from the foot.  In the ED, patient was afebrile, hemodynamically stable and breathing on room air. MRI left foot showed large plantar foot wound with possible septic arthritis, abscess and worsening osteomyelitis.   EDP discussed with orthopedics Dr. Doran Durand  Admitted to [facility] with tentative plan of amputation.  Subjective: Patient was seen and examined this morning.  Pleasant middle-aged African-American male.  Comfortable in bed.  Pain controlled.  Wife at bedside. Was seen by orthopedic Dr. Doran Durand earlier and recommended left BKA.  It seems patient is convinced with recommendation but his wife still wants to confirm the plan with his previous podiatrist. Chart reviewed Afebrile, hemodynamically stable and breathing on room air Last set of labs from this morning showed potassium low at 3.1, sodium low at 130, hemoglobin low at 8.7  Assessment and plan: Acute on chronic osteomyelitis, septic arthritis, abscess  Chronic nonhealing left foot diabetic ulcer History and imagings as above Seen by orthopedics.  Recommended left BKA.  Patient and his wife are in process of confirming the recommendation with his previous  podiatrist in [place]. Patient had arterial Doppler done in November 2023 which showed normal arterial perfusion of both feet  Currently on IV ceftriaxone, IV Flagyl and oral Zyvox.  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  Repeat A1c PTA on on Lantus 20 units daily and Humalog 15 units 3 times daily. Currently on sliding scale insulin with Accu-Cheks.  Continue to monitor.  May need long-acting insulin started as well. Lab Results  Component Value Date   HGBA1C 12.3 (H) 06/07/2022   Recent Labs  Lab 08/13/22 1657 08/13/22 1950 08/14/22 0718 08/14/22 0943 08/14/22 1107  GLUCAP 195* 178* 129* 132* 154*    Essential hypertension Chronic bilateral lower extremity edema PTA supposed to be on Norvasc and losartan but he apparently was not taking them.   Not on any diuretics. Blood pressure was up to 180s yesterday.  Normal this morning.  Postsurgically, will plan to start him on diuretics.   Hypokalemia Potassium low at 3.1.  IV replacement ordered. Recent Labs  Lab 08/13/22 1500 08/14/22 0500  K 3.2* 3.1*    Morbid Obesity  Body mass index is 45.52 kg/m. Patient has been advised to make an attempt to improve diet and exercise patterns to aid in weight loss.  Mobility: Needs PT eval postprocedure  Goals of care   Code Status: Full Code     DVT prophylaxis: Currently not on DVT prophylaxis.  Will order after surgery   Antimicrobials: IV Rocephin, IV Flagyl  and Zyvox Fluid: Currently on LR at 150 mill per hour.  Switch to NS at 75 mill per hour Consultants: Orthopedics Family Communication: Wife at bedside  Status: Inpatient Level of care:  Telemetry Medical   Needs to continue in-hospital care:  Needs left BKA  Patient from: Home Anticipated d/c to: Pending clinical course      Diet:  Diet  Order             Diet NPO time specified  Diet effective ____           Diet NPO time specified  Diet effective midnight                   Scheduled Meds:  chlorhexidine  60 mL Topical Once   insulin aspart  0-20 Units Subcutaneous TID WC   insulin aspart  0-5 Units Subcutaneous QHS   multivitamin with minerals  1 tablet Oral Daily   povidone-iodine  2 Application Topical Once    PRN meds: acetaminophen **OR** acetaminophen, hydrALAZINE, hydrocortisone cream, LORazepam, LORazepam, ondansetron **OR** ondansetron (ZOFRAN) IV, oxyCODONE   Infusions:   sodium chloride     [START ON 08/15/2022]  ceFAZolin (ANCEF) IV     cefTRIAXone (ROCEPHIN)  IV 2 g (08/13/22 2108)   linezolid (ZYVOX) IV 600 mg (08/14/22 0948)   metronidazole 500 mg (08/14/22 0500)   potassium chloride      Antimicrobials: Anti-infectives (From admission, onward)    Start     Dose/Rate Route Frequency Ordered Stop   08/15/22 0600  ceFAZolin (ANCEF) IVPB 3g/100 mL premix        3 g 200 mL/hr over 30 Minutes Intravenous On call to O.R. 08/14/22 1128 08/16/22 0559   08/14/22 0215  linezolid (ZYVOX) IVPB 600 mg       Note to Pharmacy: Patient not tolerating vanc IV even at lower rate, changing to zyvox. Patient denies history of past zyvox reaction. First vanc dose was not fully given.   600 mg 300 mL/hr over 60 Minutes Intravenous Every 12 hours 08/14/22 0124     08/14/22 0200  vancomycin (VANCOREADY) IVPB 2000 mg/400 mL  Status:  Discontinued        2,000 mg 100 mL/hr over 240 Minutes Intravenous Every 12 hours 08/13/22 1750 08/14/22 0225   08/13/22 2200  cefTRIAXone (ROCEPHIN) 2 g in sodium chloride 0.9 % 100 mL IVPB        2 g 200 mL/hr over 30 Minutes Intravenous Every 24 hours 08/13/22 1646 08/20/22 2159   08/13/22 1345  vancomycin (VANCOREADY) IVPB 2000 mg/400 mL        2,000 mg 200 mL/hr over 120 Minutes Intravenous  Once 08/13/22 1311 08/13/22 1626   08/13/22 1315  metroNIDAZOLE (FLAGYL) IVPB  500 mg        500 mg 100 mL/hr over 60 Minutes Intravenous Every 12 hours 08/13/22 1307 08/20/22 1559   08/13/22 1315  ceFEPIme (MAXIPIME) 2 g in sodium chloride 0.9 % 100 mL IVPB        2 g 200 mL/hr over 30 Minutes Intravenous  Once 08/13/22 1311 08/13/22 1406       Nutritional status:  Body mass index is 45.52 kg/m.  Nutrition Problem: Increased nutrient needs Etiology: wound healing, post-op healing Signs/Symptoms: estimated needs     Objective: Vitals:   08/14/22 0717 08/14/22 0717  BP: 137/74 137/74  Pulse: 88 88  Resp:    Temp: 98 F (36.7 C) 98 F (36.7 C)  SpO2: 99% 100%    Intake/Output Summary (Last 24 hours) at 08/14/2022 1136 Last data filed at 08/14/2022 0429 Gross per 24 hour  Intake 3655.47 ml  Output 500 ml  Net 3155.47 ml   Filed Weights   08/13/22 1206  Weight: (!) 169.6 kg   Weight change:  Body mass index is 45.52 kg/m.   Physical Exam: General exam: Pleasant, middle-aged African-American male. Skin: No rashes, lesions or ulcers. HEENT: Atraumatic, normocephalic, no obvious bleeding Lungs: Clear to auscultate bilaterally CVS: Regular rate and rhythm, no murmur GI/Abd soft, nontender, nondistended, bowel sound present CNS: Alert, awake, oriented x 3 Psychiatry: Sad affect Extremities: Chronic bilateral lower extremity edema left more than right.  Left foot wound is wrapped.  Data Review: I have personally reviewed the laboratory data and studies available.  F/u labs ordered Unresulted Labs (From admission, onward)     Start     Ordered   08/20/22 0500  Creatinine, serum  (enoxaparin (LOVENOX)    CrCl >/= 30 ml/min)  Weekly,   R     Comments: while on enoxaparin therapy    08/13/22 1846   08/15/22 0500  CBC with Differential/Platelet  Daily,   R      08/14/22 1133   08/15/22 [id number]  Basic metabolic panel  Daily,   R      08/14/22 1133   08/13/22 1651  Hemoglobin A1c  Once,   R        08/13/22 1650   08/13/22 1231  C-reactive  protein  Once,   URGENT        08/13/22 1230            Total time spent in review of labs and imaging, patient evaluation, formulation of plan, documentation and communication with family: 18 minutes  Signed, Terrilee Croak, MD [facility] 08/14/2022

## 2022-08-14 NOTE — Consult Note (Addendum)
Reason for Consult: Left foot diabetic ulcer Referring Physician: Dr. Ilda Foil Alan Tapia. is an 47 y.o. male.  HPI: 47 year old male with a past medical history significant for uncontrolled diabetes complains of a 14-month history of a left foot ulcer.  When this condition started he resided in [place].  He has been seeing a podiatrist there weekly for debridement.  He recently moved to [facility].  At that time he had a PICC line and was receiving vancomycin infusions.  The PICC line fell out.  He has not had any antibiotics in about 5 weeks prior to presenting to the emergency room yesterday.  He is not a smoker.  His most recent hemoglobin A1c was around 12.  His wife is at the bedside.  He complains of chronic pain in the left foot and leg as well as chronic swelling.  He complains of recent constitutional symptoms including myalgias and arthralgias.  No fever.  Past Medical History:  Diagnosis Date   Hypertension    Uncontrolled type 2 diabetes mellitus with hyperglycemia, with long-term current use of insulin 06/06/2022    Past surgical history: Left foot first ray amputation  Family History  Problem Relation Age of Onset   Arthritis Mother    Allergies Father    Hypertension Father    Diabetes Father     Social History:  reports that he has never smoked. He has never used smokeless tobacco. He reports that he does not drink alcohol and does not use drugs.  Allergies:  Allergies  Allergen Reactions   Benadryl [Diphenhydramine] Swelling    Pt reports cannot take pill form of benadryl    Medications: I have reviewed the patient's current medications. He is on vancomycin, cefepime and Flagyl.  Results for orders placed or performed during the hospital encounter of 08/13/22 (from the past 48 hour(s))  Lactic acid, plasma     Status: None   Collection Time: 08/13/22  3:00 PM  Result Value Ref Range   Lactic Acid, Venous 1.3 0.5 - 1.9 mmol/L    Comment: Performed at  [facility], [place] [address] [place], [place] [address]  CBC     Status: Abnormal   Collection Time: 08/13/22  3:00 PM  Result Value Ref Range   WBC 10.9 (H) 4.0 - 10.5 K/uL   RBC 4.35 4.22 - 5.81 MIL/uL   Hemoglobin 9.8 (L) 13.0 - 17.0 g/dL   HCT 32.5 (L) 39.0 - 52.0 %   MCV 74.7 (L) 80.0 - 100.0 fL   MCH 22.5 (L) 26.0 - 34.0 pg   MCHC 30.2 30.0 - 36.0 g/dL   RDW 15.2 11.5 - 15.5 %   Platelets 402 (H) 150 - 400 K/uL   nRBC 0.0 0.0 - 0.2 %    Comment: Performed at [facility], [place] [address] [place], [place] [id number]  Basic metabolic panel     Status: Abnormal   Collection Time: 08/13/22  3:00 PM  Result Value Ref Range   Sodium 133 (L) 135 - 145 mmol/L   Potassium 3.2 (L) 3.5 - 5.1 mmol/L   Chloride 104 98 - 111 mmol/L   CO2 22 22 - 32 mmol/L   Glucose, Bld 196 (H) 70 - 99 mg/dL    Comment: Glucose reference range applies only to samples taken after fasting for at least 8 hours.   BUN 7 6 - 20 mg/dL   Creatinine, Ser 0.98 0.61 - 1.24 mg/dL   Calcium  7.5 (L) 8.9 - 10.3 mg/dL   GFR, Estimated >60 >60 mL/min    Comment: (NOTE) Calculated using the CKD-EPI Creatinine Equation (2021)    Anion gap 7 5 - 15    Comment: Performed at [facility], [place] [address], [place], [place] [address]  Sedimentation rate     Status: Abnormal   Collection Time: 08/13/22  3:00 PM  Result Value Ref Range   Sed Rate 107 (H) 0 - 16 mm/hr    Comment: Performed at [facility], [place] [address] [place], [place] [address]  CBG monitoring, ED     Status: Abnormal   Collection Time: 08/13/22  4:57 PM  Result Value Ref Range   Glucose-Capillary 195 (H) 70 - 99 mg/dL    Comment: Glucose reference range applies only to samples taken after fasting for at least 8 hours.  HIV Antibody (routine testing w rflx)     Status: None   Collection Time: 08/13/22  7:40 PM  Result Value Ref Range   HIV Screen 4th Generation wRfx Non  Reactive Non Reactive    Comment: Performed at [facility], [place] [address], [place], [place] [address]  Prealbumin     Status: Abnormal   Collection Time: 08/13/22  7:40 PM  Result Value Ref Range   Prealbumin 8 (L) 18 - 38 mg/dL    Comment: Performed at [place] [address] [place], [place] [address]  Glucose, capillary     Status: Abnormal   Collection Time: 08/13/22  7:50 PM  Result Value Ref Range   Glucose-Capillary 178 (H) 70 - 99 mg/dL    Comment: Glucose reference range applies only to samples taken after fasting for at least 8 hours.  Basic metabolic panel     Status: Abnormal   Collection Time: 08/14/22  5:00 AM  Result Value Ref Range   Sodium 130 (L) 135 - 145 mmol/L   Potassium 3.1 (L) 3.5 - 5.1 mmol/L   Chloride 104 98 - 111 mmol/L   CO2 21 (L) 22 - 32 mmol/L   Glucose, Bld 155 (H) 70 - 99 mg/dL    Comment: Glucose reference range applies only to samples taken after fasting for at least 8 hours.   BUN 9 6 - 20 mg/dL   Creatinine, Ser 1.16 0.61 - 1.24 mg/dL   Calcium 7.2 (L) 8.9 - 10.3 mg/dL   GFR, Estimated >60 >60 mL/min    Comment: (NOTE) Calculated using the CKD-EPI Creatinine Equation (2021)    Anion gap 5 5 - 15    Comment: Performed at [place] [address], [place], [place] [address]  CBC     Status: Abnormal   Collection Time: 08/14/22  5:00 AM  Result Value Ref Range   WBC 8.7 4.0 - 10.5 K/uL   RBC 3.73 (L) 4.22 - 5.81 MIL/uL   Hemoglobin 8.7 (L) 13.0 - 17.0 g/dL    Comment: Reticulocyte Hemoglobin testing may be clinically indicated, consider ordering this additional test [id number]    HCT 26.8 (L) 39.0 - 52.0 %   MCV 71.8 (L) 80.0 - 100.0 fL   MCH 23.3 (L) 26.0 - 34.0 pg   MCHC 32.5 30.0 - 36.0 g/dL   RDW 15.3 11.5 - 15.5 %   Platelets 367 150 - 400 K/uL   nRBC 0.0 0.0 - 0.2 %    Comment: Performed at [facility], [place],  [place] [address]  Glucose, capillary     Status: Abnormal    Collection Time: 08/14/22  7:18 AM  Result Value Ref Range   Glucose-Capillary 129 (H) 70 - 99 mg/dL    Comment: Glucose reference range applies only to samples taken after fasting for at least 8 hours.    MRI Left foot without contrast  Result Date: 08/13/2022 CLINICAL DATA:  Soft tissue infection suspected, foot, xray done EXAM: MRI OF THE LEFT FOOT WITHOUT CONTRAST TECHNIQUE: Multiplanar, multisequence MR imaging of the left foot was performed. No intravenous contrast was administered. 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 There is prominent adjacent heterotopic ossification and callus formation, demonstrating increased edema signal and some areas of low T1 signal. No significant marrow abnormality involving the second or third rays. There is marrow edema throughout the fourth toe phalanges and fourth metatarsal. There is confluent low T1 marrow signal in the periarticular fourth toe proximal phalanx and metatarsal head at the MTP joint with and MTP joint effusion. There is marrow edema and confluent low T1 signal throughout the fifth toe proximal phalanx and fifth metatarsal. There is extensive marrow edema within the plantar aspect of the cuboid with preserved T1 marrow signal. Very mild periarticular middle and lateral cuneiforms marrow edema at the TMT joints, with preserved T1 marrow signal, likely reactive. Ligaments Intact Lisfranc ligament. Muscles and Tendons Denervation changes of the foot musculature. Soft tissues Extensive skin thickening and soft tissue swelling of the foot. There is a large lateral plantar wound with notable tract extending to the base of the fifth metatarsal. There is underlying confluent fluid and soft tissue gas related to the ulcer/wound measuring up to 1.0 cm short axis and extends to the surface of the base of the fifth metatarsal. IMPRESSION:  Large plantar foot wound with lateral midfoot tract extending to the base of the fifth metatarsal. Underlying confluent fluid in the lateral midfoot measuring up to 1.0 cm short axis which could reflect a fluid collection/abscess or coalescent edema, correlate with drainage. Osteomyelitis of the fifth toe proximal phalanx and of the entire fifth metatarsal. Septic arthritis and periarticular osteomyelitis of the fourth toe MTP joint. Marrow edema involvement includes all of the fourth toe phalanges and the entirety of the fourth metatarsal. 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 Marrow edema within the plantar aspect of the cuboid which could reflect reactive marrow change or early osteomyelitis. Extensive soft tissue swelling of the foot. Electronically Signed   By: Maurine Simmering M.D.   On: 08/13/2022 14:56   DG Foot Complete Left  Result Date: 08/13/2022 CLINICAL DATA:  Foot wound. Osteomyelitis. None cellulitis and osteomyelitis. Nonhealing ulcer on bottom of left foot for several months. Patient was being treated in [place] with IV antibiotics through a PICC line, and had discussion about proceeding to wound VAC. Patient reports he had to move to [place] suddenly and has not had any IV antibiotics for 4 weeks. EXAM: LEFT FOOT - COMPLETE 3+ VIEW COMPARISON:  Left foot radiographs 06/06/2022 FINDINGS: There is high-grade soft tissue swelling. Postsurgical changes are again seen of amputation of the first digit to the mid metatarsal shaft. Unchanged chronic bone formation abutting the distal lateral aspect of the first metatarsal. There is a new soft tissue ulcer lateral to the  proximal 50% of the fifth metatarsal shaft. Note is made that subcutaneous air was previously seen lateral to the distal fifth metatarsal,  there were findings concerning for osteomyelitis on both sides of the fifth metatarsophalangeal joint. This distal fifth metatarsal and proximal aspect of the proximal phalanx cortical erosion has progressed. There is diffuse periosteal reaction along the fifth metatarsal shaft. There is also new mild erosion at the lateral base of the fifth metatarsal. There is new mild-to-moderate erosion at the distal fourth metatarsal and proximal lateral aspect of the fourth proximal phalanx. Moderate to large plantar and posterior calcaneal heel spurs. 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 IMPRESSION: 1. High-grade soft tissue swelling. 2. New soft tissue ulcer lateral to the proximal 50% of the fifth metatarsal shaft. 3. Progression of osteomyelitis of the fifth digit distal aspect of the metatarsal and proximal aspect of the proximal phalanx. New erosion at the lateral base of the fifth metatarsal. 4. New mild-to-moderate erosion at the distal fourth metatarsal and proximal lateral aspect of the fourth proximal phalanx. 5. These findings are highly suspicious for progressive osteomyelitis. Electronically Signed   By: Yvonne Kendall M.D.   On: 08/13/2022 13:27    ROS: 10 system review is negative other than as documented in the HPI PE:  Blood pressure 137/74, pulse 88, temperature 98 F (36.7 C), temperature source Oral, resp. rate 19, height 6\' 4"  (1.93 m), weight (!) 169.6 kg, SpO2 100 %. Obese male in no apparent distress.  Alert and oriented.  Extraocular motions are intact.  Respirations are unlabored.  Left leg has brawny edema from the proximal tibia down to the foot.  The left foot has a large plantar ulcer beneath the lateral rays extending back to the cuboid.  There is bone and tendon exposed distally in the vicinity of the fourth and fifth MTP joints.  Brisk capillary refill at the toes.  Diminished sensibility to light touch at the forefoot.  No lymphadenopathy or  lymphangitis.  Assessment/Plan: Chronic nonhealing left foot diabetic ulcer and osteomyelitis -I explained the nature of this condition to the patient and his wife in detail.  At this point I do not believe his foot can be salvaged.  There is insufficient soft tissues to cover transmetatarsal amputation.  I believe below-knee amputation is the appropriate level at this stage.  However I have emphasized that the chronic edema of his left leg will make healing more challenging.  He understands the risks and benefits of the alternative treatment options.  N.p.o. now.  Hold blood thinners.  He is on the surgical schedule for 215 this afternoon.  The risks and benefits of the alternative treatment options have been discussed in detail.  The patient wishes to proceed with surgery and specifically understands risks of bleeding, infection, nerve damage, blood clots, need for additional surgery, amputation and death.   Wylene Simmer August 29, 2022, 7:24 AM     Addendum:  Since I saw the patient this morning he requested consultation with his podiatrist in [place].  I spoke with Dr. Gaylyn Cheers  and Dr. Pietro Cassis.  Dr. Gaylyn Cheers reports that the patient has been noncompliant with their treatment plan for the last 6 weeks in particular and since last fall in general.  He reports that a BKA would have been reasonable and appropriate when he started treating the patient last fall.  He agrees that a BKA seems reasonable now.  Unfortunately this situation has substantially delayed the case.  I have cancelled the case  for today.  Dr. Sharol Given may be an option for earlier surgery, but I am available to take the patient to surgery next Tuesday.  The patient and his wife understand this plan.  Dr. Pietro Cassis is aware also.

## 2022-08-14 NOTE — Progress Notes (Signed)
Patient's wife voiced concerns regarding proceeding with BKA. According to the patient's wife, the patient was seeing Dr.Ma, a podiatrist in [place] and that doctor did not recommend a BKA. Patient and his wife wanted to delay surgery until Dr.Hewitt was able to speak with Dr.Ma regarding plan of care.   Dr.Hewitt and Dr. Pietro Cassis made aware.

## 2022-08-14 NOTE — Inpatient Diabetes Management (Signed)
Inpatient Diabetes Program Recommendations  AACE/ADA: New Consensus Statement on Inpatient Glycemic Control (2015)  Target Ranges:  Prepandial:   less than 140 mg/dL      Peak postprandial:   less than 180 mg/dL (1-2 hours)      Critically ill patients:  140 - 180 mg/dL   Lab Results  Component Value Date   GLUCAP 129 (H) 08/14/2022   HGBA1C 12.3 (H) 06/07/2022    Latest Reference Range & Units 08/13/22 16:57 08/13/22 19:50 08/14/22 07:18  Glucose-Capillary 70 - 99 mg/dL 195 (H) 178 (H) 129 (H)  (H): Data is abnormally high  Diabetes history: DM2 Outpatient Diabetes medications: Lantus 20 units qd, Humalog 15 units tid meal coverage Current orders for Inpatient glycemic control: Novolog 0-20 units tid, 0-5 units hs correction  Inpatient Diabetes Program Recommendations:   Noted last A1c 12.3 06/07/22. Patient scheduled for surgery today and will follow post surgery.  Thank you, Nani Gasser. Shaleta Ruacho, RN, MSN, CDE  Diabetes Coordinator Inpatient Glycemic Control Team Team Pager [phone] (8am-5pm) 08/14/2022 9:14 AM

## 2022-08-14 NOTE — Progress Notes (Signed)
Responded to consult for IV. (Per RN, due to rash). On arrival to room, pt stated rash is on opposite arm from IV and is resolving. Pt denies any symptoms of infiltration. Site assessment as documented. Will hold on additional access at this time.

## 2022-08-14 NOTE — Progress Notes (Signed)
Initial Nutrition Assessment  DOCUMENTATION CODES:   Not applicable  INTERVENTION:   - Once diet advanced, double protein portions TID with meals  - MVI with minerals daily  - Diet education provided and "Carbohydrate Counting for People with Diabetes" and "Plate Method for Diabetes" handouts given to pt  NUTRITION DIAGNOSIS:   Increased nutrient needs related to wound healing, post-op healing as evidenced by estimated needs.  GOAL:   Patient will meet greater than or equal to 90% of their needs  MONITOR:   PO intake, Supplement acceptance, Labs, Weight trends, Skin  REASON FOR ASSESSMENT:   Consult Wound healing  ASSESSMENT:   47 year old male who presented to the ED on 4/03 with wound infection of the L foot. PMH of chronic osteomyelitis, DM, HTN. Pt admitted with osteomyelitis and abscess.  Pt with a chronic nonhealing left foot diabetic ulcer and osteomyelitis and is currently NPO for left BKA today by Orthopedics.  Per notes, pt recently moved to [place] after receiving treatment in [place] for 22-month history of a left foot ulcer. Treatment included IV antibiotics which pt has been unable to receive over the last 5 weeks due to his PICC line falling out.  Spoke with pt and significant other at bedside. Pt reports that he first began having issues after being prescribed blood pressure medication that did not "mix" with the metformin he was also prescribed. 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  Pt reports decreased appetite and PO intake over the last several weeks due to active infection. Pt shares that he has been unable to tolerate more than 1-2 bites of "processed foods" during this time but has been able to consume "fresh, naturally grown" foods with fruits, vegetables, and plain chicken without issue. For example, pt's family purchased  him a burger meal from [employer], but pt was only able to eat 1-2 bites even with max encouragement from significant other. Pt endorses nausea and dry heaving. He also states that foods are not "tasting right."  Discussed with pt that his nutrients needs are elevated due to infection and also to promote post-op healing. Explained importance of adequate protein intake in promoting wound healing. Also discussed importance of tight blood sugar control in promoting healing.  Pt requesting education materials for better blood sugar control. RD provided "Carbohydrate Counting for People with Diabetes" and "Plate Method for Diabetes" handouts from the Academy of [employer] and Dietetics. Discussed different food groups and their effects on blood sugar, emphasizing carbohydrate-containing foods. Provided list of carbohydrates and recommended serving sizes of common foods. Also discussed importance of controlled and consistent carbohydrate intake throughout the day. Pt has been trying to eat smaller, more frequent meals that are balanced for better blood sugar control. Provided additional examples of ways to balance meals/snacks and encouraged intake of high-fiber, whole grain complex carbohydrates. Teach back method used.  Pt reports that he cannot tolerate protein shakes other than Muscle Milk. Encouraged pt to purchase some of this for use after discharge. Pt willing to receive double protein portions with his meals once diet advanced post-op. Pt also willing to take MVI with minerals daily. Discussed importance of vitamin A, vitamin C, vitamin D, and zinc in wound healing. Pt expresses understanding.  Pt with deep pitting edema to BLE.  Medications reviewed and include: SSI, IV abx IVF: LR @ 150 ml/hr  Labs reviewed:  sodium 130, potassium 3.1, hemoglobin 8.7, hemoglobin A1C 12.3 on 06/07/22 (repeat pending) CBG's: 129-195 x 24 hours  I/O's: +3.1 L since admit  NUTRITION - FOCUSED PHYSICAL  EXAM:  Flowsheet Row Most Recent Value  Orbital Region No depletion  Upper Arm Region No depletion  Thoracic and Lumbar Region No depletion  Buccal Region No depletion  Temple Region No depletion  Clavicle Bone Region No depletion  Clavicle and Acromion Bone Region No depletion  Scapular Bone Region No depletion  Dorsal Hand No depletion  Patellar Region No depletion  Anterior Thigh Region No depletion  Posterior Calf Region No depletion  Edema (RD Assessment) Severe  [BLE]  Hair Reviewed  Eyes Reviewed  Mouth Reviewed  Skin Reviewed  Nails Reviewed       Diet Order:   Diet Order             Diet NPO time specified  Diet effective ____           Diet NPO time specified  Diet effective midnight                   EDUCATION NEEDS:   Education needs have been addressed  Skin:  Skin Assessment: Skin Integrity Issues: Other: osteomyelitis/abscess left foot  Last BM:  no documented BM  Height:   Ht Readings from Last 1 Encounters:  08/13/22 6\' 4"  (1.93 m)    Weight:   Wt Readings from Last 1 Encounters:  08/13/22 (!) 169.6 kg    Ideal Body Weight:  91.8 kg  BMI:  Body mass index is 45.52 kg/m.  Estimated Nutritional Needs:   Kcal:  2400-2600  Protein:  120-140 grams  Fluid:  >2.2 L    Gustavus Bryant, MS, RD, LDN Inpatient Clinical Dietitian Please see AMiON for contact information.

## 2022-08-14 NOTE — TOC Initial Note (Signed)
Transition of Care (TOC) - Initial/Assessment Note    Patient Details  Name: Alan Tapia. MRN: [id number] Date of Birth: 06/06/1975  Transition of Care [facility]) CM/SW Contact:    Sharin Mons, RN Phone Number: 08/14/2022, 12:08 PM  Clinical Narrative:   Presents with infected L foot. NCM @ bedside to speak with pt regarding d/c planning. Wife @ bedside. Consent given by pt to speak in front wife. Pt states he currently resides in [place] with wife. States has been in [place] X 3 weeks. Prior was living in [place] with parents. States was under a podiatrist care in [place] and receiving IV ABX THERAPY.  States did have a PICC LINE, however, it fell out 4 weeks ago. Wife provided NCM with AVS from [facility] in  [place]  which showed pt was setup with [facility] [phone])  for home IV  infusion. NCM called [facility] and spoke with a liaison and was informed on 07/01/2022 pt was released from their services 2/2 pt would not allow infusion nurse to touch or care for PICC LINE. They were unable to reach or speak with pt on multi occasions. They deemed pt not a candidate  for home infusion services. 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   Pt with out of state [facility]. States Medicaid for [place] in process, applied 3/29. Wife states pt also has [employer], card not available. Wife to bring card in.  Per wife pt has a W/C and a cane @ home.  Ortho following ...  ? surgery pending  TOC team following and will assist with needs.....  Expected Discharge Plan: [place] (vs SNF) Barriers to Discharge: Continued Medical Work up   Patient Goals and CMS Choice       Expected Discharge Plan and Services   Discharge Planning Services: CM Consult   Living arrangements for the past 2 months: Apartment                     Prior Living  Arrangements/Services Living arrangements for the past 2 months: Apartment Lives with:: Spouse Patient language and need for interpreter reviewed:: Yes Do you feel safe going back to the place where you live?: Yes      Need for Family Participation in Patient Care: Yes (Comment) Care giver support system in place?: Yes (comment)   Criminal Activity/Legal Involvement Pertinent to Current Situation/Hospitalization: No - Comment as needed  Activities of Daily Living Home Assistive Devices/Equipment: Eyeglasses, Gilford Rile (specify type) ADL Screening (condition at time of admission) Patient's cognitive ability adequate to safely complete daily activities?: Yes Is the patient deaf or have difficulty hearing?: No Does the patient have difficulty seeing, even when wearing glasses/contacts?: No Does the patient have difficulty concentrating, remembering, or making decisions?: No Patient able to express need for assistance with ADLs?: Yes Does the patient have difficulty dressing or bathing?: Yes Independently performs ADLs?: No Communication: Independent Dressing (OT): Needs assistance Is this a change from baseline?: Change from baseline, expected to last >3 days Grooming: Needs assistance Is this a change from baseline?: Change from baseline, expected to last >3 days Feeding: Independent Bathing: Needs assistance Is this a change from baseline?: Change from baseline, expected to last >3 days Toileting: Needs assistance Is this a change from baseline?: Change from baseline, expected to last <3 days In/Out Bed: Needs assistance Is  this a change from baseline?: Change from baseline, expected to last >3 days Walks in Home: Independent with device (comment) Does the patient have difficulty walking or climbing stairs?: Yes Weakness of Legs: Both Weakness of Arms/Hands: None  Permission Sought/Granted                  Emotional Assessment Appearance:: Appears stated  age Attitude/Demeanor/Rapport: Engaged Affect (typically observed): Accepting Orientation: : Oriented to Self, Oriented to Place, Oriented to  Time, Oriented to Situation Alcohol / Substance Use: Not Applicable Psych Involvement: No (comment)  Admission diagnosis:  Osteomyelitis [M86.9] Osteomyelitis of left foot, unspecified type [M86.9] Patient Active Problem List   Diagnosis Date Noted   Osteomyelitis 08/13/2022   Hypokalemia 08/13/2022   Diabetic foot infection 06/06/2022   Uncontrolled type 2 diabetes mellitus with hyperglycemia, with long-term current use of insulin 06/06/2022   Hypertensive urgency 06/06/2022   PCP:  Patient, No Pcp Per Pharmacy:   [facility] [phone] - [place], [address] [place] [address] Phone: [phone] Fax: [phone]  [id number] - [place], [place] [id number] [place] [id number] Phone: [phone] Fax: [phone]     Social Determinants of Health (SDOH) Social History: [place]: No Food Insecurity (08/13/2022)  Housing: Low Risk  (08/13/2022)  Transportation Needs: No Transportation Needs (08/13/2022)  Utilities: Not At Risk (08/13/2022)  Tobacco Use: Low Risk  (08/13/2022)   SDOH Interventions: Housing Interventions: Intervention Not Indicated   Readmission Risk Interventions     No data to display

## 2022-08-15 ENCOUNTER — Inpatient Hospital Stay (HOSPITAL_COMMUNITY): Payer: Medicaid - Out of State | Admitting: Anesthesiology

## 2022-08-15 ENCOUNTER — Encounter (HOSPITAL_COMMUNITY)
Admission: EM | Disposition: A | Discharge status: Rehabilitation Facility | Payer: Self-pay | Source: Home / Self Care | Attending: Internal Medicine

## 2022-08-15 DIAGNOSIS — E43 Unspecified severe protein-calorie malnutrition: Secondary | ICD-10-CM | POA: Diagnosis not present

## 2022-08-15 DIAGNOSIS — Z6841 Body Mass Index (BMI) 40.0 and over, adult: Secondary | ICD-10-CM

## 2022-08-15 DIAGNOSIS — A419 Sepsis, unspecified organism: Secondary | ICD-10-CM | POA: Diagnosis not present

## 2022-08-15 DIAGNOSIS — I10 Essential (primary) hypertension: Secondary | ICD-10-CM | POA: Diagnosis not present

## 2022-08-15 DIAGNOSIS — E1165 Type 2 diabetes mellitus with hyperglycemia: Secondary | ICD-10-CM

## 2022-08-15 DIAGNOSIS — M86272 Subacute osteomyelitis, left ankle and foot: Secondary | ICD-10-CM | POA: Diagnosis not present

## 2022-08-15 DIAGNOSIS — I890 Lymphedema, not elsewhere classified: Secondary | ICD-10-CM | POA: Diagnosis not present

## 2022-08-15 DIAGNOSIS — E1169 Type 2 diabetes mellitus with other specified complication: Secondary | ICD-10-CM

## 2022-08-15 DIAGNOSIS — E11628 Type 2 diabetes mellitus with other skin complications: Secondary | ICD-10-CM

## 2022-08-15 DIAGNOSIS — L089 Local infection of the skin and subcutaneous tissue, unspecified: Secondary | ICD-10-CM

## 2022-08-15 DIAGNOSIS — Z515 Encounter for palliative care: Secondary | ICD-10-CM

## 2022-08-15 DIAGNOSIS — Z794 Long term (current) use of insulin: Secondary | ICD-10-CM

## 2022-08-15 DIAGNOSIS — M869 Osteomyelitis, unspecified: Secondary | ICD-10-CM | POA: Diagnosis not present

## 2022-08-15 HISTORY — PX: AMPUTATION: SHX166

## 2022-08-15 LAB — CBC WITH DIFFERENTIAL/PLATELET
Abs Immature Granulocytes: 0.04 10*3/uL (ref 0.00–0.07)
Basophils Absolute: 0.1 10*3/uL (ref 0.0–0.1)
Basophils Relative: 1 %
Eosinophils Absolute: 0.1 10*3/uL (ref 0.0–0.5)
Eosinophils Relative: 1 %
HCT: 29.1 % — ABNORMAL LOW (ref 39.0–52.0)
Hemoglobin: 8.7 g/dL — ABNORMAL LOW (ref 13.0–17.0)
Immature Granulocytes: 0 %
Lymphocytes Relative: 18 %
Lymphs Abs: 1.7 10*3/uL (ref 0.7–4.0)
MCH: 22.3 pg — ABNORMAL LOW (ref 26.0–34.0)
MCHC: 29.9 g/dL — ABNORMAL LOW (ref 30.0–36.0)
MCV: 74.6 fL — ABNORMAL LOW (ref 80.0–100.0)
Monocytes Absolute: 0.7 10*3/uL (ref 0.1–1.0)
Monocytes Relative: 7 %
Neutro Abs: 6.7 10*3/uL (ref 1.7–7.7)
Neutrophils Relative %: 73 %
Platelets: 388 10*3/uL (ref 150–400)
RBC: 3.9 MIL/uL — ABNORMAL LOW (ref 4.22–5.81)
RDW: 15.4 % (ref 11.5–15.5)
WBC: 9.3 10*3/uL (ref 4.0–10.5)
nRBC: 0 % (ref 0.0–0.2)

## 2022-08-15 LAB — GLUCOSE, CAPILLARY
Glucose-Capillary: 125 mg/dL — ABNORMAL HIGH (ref 70–99)
Glucose-Capillary: 121 mg/dL — ABNORMAL HIGH (ref 70–99)
Glucose-Capillary: 124 mg/dL — ABNORMAL HIGH (ref 70–99)
Glucose-Capillary: 146 mg/dL — ABNORMAL HIGH (ref 70–99)
Glucose-Capillary: 165 mg/dL — ABNORMAL HIGH (ref 70–99)
Glucose-Capillary: 224 mg/dL — ABNORMAL HIGH (ref 70–99)

## 2022-08-15 LAB — BASIC METABOLIC PANEL
Anion gap: 6 (ref 5–15)
BUN: 6 mg/dL (ref 6–20)
CO2: 24 mmol/L (ref 22–32)
Calcium: 7.5 mg/dL — ABNORMAL LOW (ref 8.9–10.3)
Chloride: 104 mmol/L (ref 98–111)
Creatinine, Ser: 1.18 mg/dL (ref 0.61–1.24)
GFR, Estimated: >60 mL/min (ref >60)
Glucose, Bld: 130 mg/dL — ABNORMAL HIGH (ref 70–99)
Potassium: 3.5 mmol/L (ref 3.5–5.1)
Sodium: 134 mmol/L — ABNORMAL LOW (ref 135–145)

## 2022-08-15 SURGERY — AMPUTATION BELOW KNEE
Anesthesia: General | Site: Knee | Laterality: Left

## 2022-08-15 MED ORDER — SODIUM CHLORIDE 0.9 % IV SOLN: INTRAVENOUS | Status: DC

## 2022-08-15 MED ORDER — DEXMEDETOMIDINE HCL 200 MCG/2ML IV SOLN
40.0000 ug | Freq: Once | INTRAVENOUS | Status: AC
  Administered 2022-08-15: 40 ug via INTRAVENOUS

## 2022-08-15 MED ORDER — HYDROMORPHONE HCL 1 MG/ML IJ SOLN: 0.5000 mg | INTRAMUSCULAR | Status: DC | PRN

## 2022-08-15 MED ORDER — POLYETHYLENE GLYCOL 3350 17 G PO PACK: 17.0000 g | PACK | Freq: Every day | ORAL | Status: DC | PRN

## 2022-08-15 MED ORDER — 0.9 % SODIUM CHLORIDE (POUR BTL) OPTIME
TOPICAL | Status: DC | PRN
  Administered 2022-08-15: 1000 mL

## 2022-08-15 MED ORDER — ONDANSETRON HCL 4 MG/2ML IJ SOLN
INTRAMUSCULAR | Status: AC
  Filled 2022-08-15: qty 2

## 2022-08-15 MED ORDER — HYDRALAZINE HCL 20 MG/ML IJ SOLN
5.0000 mg | INTRAMUSCULAR | Status: DC | PRN
  Administered 2022-08-15: 5 mg via INTRAVENOUS
  Filled 2022-08-15: qty 1

## 2022-08-15 MED ORDER — LOSARTAN POTASSIUM 50 MG PO TABS
50.0000 mg | ORAL_TABLET | Freq: Every day | ORAL | Status: DC
  Administered 2022-08-16 – 2022-08-17 (×2): 50 mg via ORAL
  Filled 2022-08-15 (×2): qty 1

## 2022-08-15 MED ORDER — BISACODYL 5 MG PO TBEC: 5.0000 mg | DELAYED_RELEASE_TABLET | Freq: Every day | ORAL | Status: DC | PRN

## 2022-08-15 MED ORDER — ONDANSETRON HCL 4 MG/2ML IJ SOLN
INTRAMUSCULAR | Status: DC | PRN
  Administered 2022-08-15: 4 mg via INTRAVENOUS

## 2022-08-15 MED ORDER — JUVEN PO PACK
1.0000 | PACK | Freq: Two times a day (BID) | ORAL | Status: DC
  Administered 2022-08-16: 1 via ORAL
  Filled 2022-08-15: qty 1

## 2022-08-15 MED ORDER — OXYCODONE HCL 5 MG PO TABS: 5.0000 mg | ORAL_TABLET | Freq: Once | ORAL | Status: DC | PRN

## 2022-08-15 MED ORDER — TRANEXAMIC ACID-NACL 1000-0.7 MG/100ML-% IV SOLN
INTRAVENOUS | Status: AC
  Filled 2022-08-15: qty 100

## 2022-08-15 MED ORDER — TRANEXAMIC ACID-NACL 1000-0.7 MG/100ML-% IV SOLN
INTRAVENOUS | Status: DC | PRN
  Administered 2022-08-15: 1000 mg via INTRAVENOUS

## 2022-08-15 MED ORDER — POTASSIUM CHLORIDE CRYS ER 20 MEQ PO TBCR: 20.0000 meq | EXTENDED_RELEASE_TABLET | Freq: Every day | ORAL | Status: DC | PRN

## 2022-08-15 MED ORDER — ONDANSETRON HCL 4 MG/2ML IJ SOLN: 4.0000 mg | Freq: Once | INTRAMUSCULAR | Status: DC | PRN

## 2022-08-15 MED ORDER — MIDAZOLAM HCL 5 MG/5ML IJ SOLN
INTRAMUSCULAR | Status: DC | PRN
  Administered 2022-08-15: 2 mg via INTRAVENOUS

## 2022-08-15 MED ORDER — METOPROLOL TARTRATE 5 MG/5ML IV SOLN: 2.0000 mg | INTRAVENOUS | Status: DC | PRN

## 2022-08-15 MED ORDER — VITAMIN C 500 MG PO TABS
1000.0000 mg | ORAL_TABLET | Freq: Every day | ORAL | Status: DC
  Administered 2022-08-15 – 2022-08-22 (×8): 1000 mg via ORAL
  Filled 2022-08-15 (×8): qty 2

## 2022-08-15 MED ORDER — BUPIVACAINE HCL (PF) 0.5 % IJ SOLN
INTRAMUSCULAR | Status: DC | PRN
  Administered 2022-08-15: 10 mL via PERINEURAL

## 2022-08-15 MED ORDER — BUPIVACAINE LIPOSOME 1.3 % IJ SUSP
INTRAMUSCULAR | Status: DC | PRN
  Administered 2022-08-15: 10 mL

## 2022-08-15 MED ORDER — LIDOCAINE 2% (20 MG/ML) 5 ML SYRINGE
INTRAMUSCULAR | Status: DC | PRN
  Administered 2022-08-15: 60 mg via INTRAVENOUS

## 2022-08-15 MED ORDER — HYDROMORPHONE HCL 1 MG/ML IJ SOLN
0.2500 mg | INTRAMUSCULAR | Status: DC | PRN
  Administered 2022-08-15 (×4): 0.5 mg via INTRAVENOUS

## 2022-08-15 MED ORDER — GUAIFENESIN-DM 100-10 MG/5ML PO SYRP: 15.0000 mL | ORAL_SOLUTION | ORAL | Status: DC | PRN

## 2022-08-15 MED ORDER — FENTANYL CITRATE (PF) 100 MCG/2ML IJ SOLN
INTRAMUSCULAR | Status: DC | PRN
  Administered 2022-08-15 (×3): 50 ug via INTRAVENOUS

## 2022-08-15 MED ORDER — OXYCODONE HCL 5 MG PO TABS: 10.0000 mg | ORAL_TABLET | ORAL | Status: DC | PRN

## 2022-08-15 MED ORDER — DOCUSATE SODIUM 100 MG PO CAPS
100.0000 mg | ORAL_CAPSULE | Freq: Every day | ORAL | Status: DC
  Filled 2022-08-15 (×5): qty 1

## 2022-08-15 MED ORDER — MIDAZOLAM HCL 2 MG/2ML IJ SOLN
INTRAMUSCULAR | Status: AC
  Administered 2022-08-15: 2 mg
  Filled 2022-08-15: qty 2

## 2022-08-15 MED ORDER — ONDANSETRON HCL 4 MG/2ML IJ SOLN: 4.0000 mg | Freq: Four times a day (QID) | INTRAMUSCULAR | Status: DC | PRN

## 2022-08-15 MED ORDER — HYDROMORPHONE HCL 1 MG/ML IJ SOLN
INTRAMUSCULAR | Status: AC
  Filled 2022-08-15: qty 1

## 2022-08-15 MED ORDER — OXYCODONE HCL 5 MG/5ML PO SOLN: 5.0000 mg | Freq: Once | ORAL | Status: DC | PRN

## 2022-08-15 MED ORDER — FENTANYL CITRATE (PF) 250 MCG/5ML IJ SOLN
INTRAMUSCULAR | Status: AC
  Filled 2022-08-15: qty 5

## 2022-08-15 MED ORDER — ZINC SULFATE 220 (50 ZN) MG PO CAPS
220.0000 mg | ORAL_CAPSULE | Freq: Every day | ORAL | Status: DC
  Administered 2022-08-15 – 2022-08-22 (×8): 220 mg via ORAL
  Filled 2022-08-15 (×8): qty 1

## 2022-08-15 MED ORDER — CHLORHEXIDINE GLUCONATE 0.12 % MT SOLN
15.0000 mL | Freq: Once | OROMUCOSAL | Status: AC
  Administered 2022-08-15: 15 mL via OROMUCOSAL
  Filled 2022-08-15: qty 15

## 2022-08-15 MED ORDER — PHENYLEPHRINE 80 MCG/ML (10ML) SYRINGE FOR IV PUSH (FOR BLOOD PRESSURE SUPPORT)
PREFILLED_SYRINGE | INTRAVENOUS | Status: AC
  Filled 2022-08-15: qty 10

## 2022-08-15 MED ORDER — CEFAZOLIN IN SODIUM CHLORIDE 3-0.9 GM/100ML-% IV SOLN
INTRAVENOUS | Status: AC
  Filled 2022-08-15: qty 100

## 2022-08-15 MED ORDER — IBUPROFEN 200 MG PO TABS
400.0000 mg | ORAL_TABLET | Freq: Three times a day (TID) | ORAL | Status: DC | PRN
  Administered 2022-08-15 – 2022-08-16 (×3): 400 mg via ORAL
  Filled 2022-08-15 (×3): qty 2

## 2022-08-15 MED ORDER — MAGNESIUM CITRATE PO SOLN: 1.0000 | Freq: Once | ORAL | Status: DC | PRN

## 2022-08-15 MED ORDER — DEXTROSE 5 % IV SOLN
INTRAVENOUS | Status: DC | PRN
  Administered 2022-08-15: 3 g via INTRAVENOUS

## 2022-08-15 MED ORDER — FENTANYL CITRATE (PF) 100 MCG/2ML IJ SOLN
INTRAMUSCULAR | Status: AC
  Administered 2022-08-15: 100 ug
  Filled 2022-08-15: qty 2

## 2022-08-15 MED ORDER — ALBUMIN HUMAN 5 % IV SOLN: INTRAVENOUS | Status: DC | PRN

## 2022-08-15 MED ORDER — MIDAZOLAM HCL 2 MG/2ML IJ SOLN
INTRAMUSCULAR | Status: AC
  Filled 2022-08-15: qty 2

## 2022-08-15 MED ORDER — MAGNESIUM SULFATE 2 GM/50ML IV SOLN: 2.0000 g | Freq: Every day | INTRAVENOUS | Status: DC | PRN

## 2022-08-15 MED ORDER — PANTOPRAZOLE SODIUM 40 MG PO TBEC
40.0000 mg | DELAYED_RELEASE_TABLET | Freq: Every day | ORAL | Status: DC
  Administered 2022-08-15 – 2022-08-21 (×4): 40 mg via ORAL
  Filled 2022-08-15 (×7): qty 1

## 2022-08-15 MED ORDER — PHENOL 1.4 % MT LIQD: 1.0000 | OROMUCOSAL | Status: DC | PRN

## 2022-08-15 MED ORDER — INSULIN ASPART 100 UNIT/ML IJ SOLN: 0.0000 [IU] | INTRAMUSCULAR | Status: DC | PRN

## 2022-08-15 MED ORDER — PHENYLEPHRINE 80 MCG/ML (10ML) SYRINGE FOR IV PUSH (FOR BLOOD PRESSURE SUPPORT)
PREFILLED_SYRINGE | INTRAVENOUS | Status: DC | PRN
  Administered 2022-08-15 (×2): 160 ug via INTRAVENOUS

## 2022-08-15 MED ORDER — LIDOCAINE 2% (20 MG/ML) 5 ML SYRINGE
INTRAMUSCULAR | Status: AC
  Filled 2022-08-15: qty 5

## 2022-08-15 MED ORDER — LABETALOL HCL 5 MG/ML IV SOLN: 10.0000 mg | INTRAVENOUS | Status: DC | PRN

## 2022-08-15 MED ORDER — LACTATED RINGERS IV SOLN: INTRAVENOUS | Status: DC

## 2022-08-15 MED ORDER — PROPOFOL 10 MG/ML IV BOLUS
INTRAVENOUS | Status: DC | PRN
  Administered 2022-08-15: 200 mg via INTRAVENOUS

## 2022-08-15 MED ORDER — CEFAZOLIN SODIUM-DEXTROSE 2-4 GM/100ML-% IV SOLN
INTRAVENOUS | Status: AC
  Filled 2022-08-15: qty 100

## 2022-08-15 MED ORDER — HYDROMORPHONE HCL 1 MG/ML IJ SOLN
0.5000 mg | INTRAMUSCULAR | Status: DC | PRN
  Administered 2022-08-15: 1 mg via INTRAVENOUS
  Filled 2022-08-15: qty 1

## 2022-08-15 MED ORDER — OXYCODONE HCL 5 MG PO TABS
5.0000 mg | ORAL_TABLET | ORAL | Status: DC | PRN
  Administered 2022-08-15: 5 mg via ORAL
  Filled 2022-08-15: qty 1
  Filled 2022-08-15: qty 2

## 2022-08-15 MED ORDER — LOSARTAN POTASSIUM 50 MG PO TABS: 50.0000 mg | ORAL_TABLET | Freq: Every day | ORAL | Status: DC

## 2022-08-15 MED ORDER — ACETAMINOPHEN 325 MG PO TABS: 325.0000 mg | ORAL_TABLET | Freq: Four times a day (QID) | ORAL | Status: DC | PRN

## 2022-08-15 MED ORDER — ORAL CARE MOUTH RINSE: 15.0000 mL | Freq: Once | OROMUCOSAL | Status: AC

## 2022-08-15 MED ORDER — ALUM & MAG HYDROXIDE-SIMETH 200-200-20 MG/5ML PO SUSP: 15.0000 mL | ORAL | Status: DC | PRN

## 2022-08-15 SURGICAL SUPPLY — 46 items
BAG COUNTER SPONGE SURGICOUNT (BAG) IMPLANT
BAG SPNG CNTER NS LX DISP (BAG)
BIT DRILL 3.2XOCPTL (BIT) ×1 IMPLANT
BIT DRL 3.2XOCPTL (BIT) ×1
BLADE SAW RECIP 87.9 MT (BLADE) ×1 IMPLANT
BLADE SURG 21 STRL SS (BLADE) ×1 IMPLANT
BNDG CMPR 5X6 CHSV STRCH STRL (GAUZE/BANDAGES/DRESSINGS)
BNDG COHESIVE 6X5 TAN NS LF (GAUZE/BANDAGES/DRESSINGS) IMPLANT
BNDG COHESIVE 6X5 TAN ST LF (GAUZE/BANDAGES/DRESSINGS) IMPLANT
CANISTER WOUND CARE 500ML ATS (WOUND CARE) ×1 IMPLANT
COVER SURGICAL LIGHT HANDLE (MISCELLANEOUS) ×1 IMPLANT
CUFF TOURN SGL QUICK 34 (TOURNIQUET CUFF) ×1
CUFF TRNQT CYL 34X4.125X (TOURNIQUET CUFF) ×1 IMPLANT
DRAPE DERMATAC (DRAPES) IMPLANT
DRAPE INCISE IOBAN 66X45 STRL (DRAPES) ×1 IMPLANT
DRAPE U-SHAPE 47X51 STRL (DRAPES) ×1 IMPLANT
DRESSING PREVENA PLUS CUSTOM (GAUZE/BANDAGES/DRESSINGS) ×1 IMPLANT
DRILL BIT (BIT) ×1
DRSG PREVENA PLUS CUSTOM (GAUZE/BANDAGES/DRESSINGS) ×1
DURAPREP 26ML APPLICATOR (WOUND CARE) ×1 IMPLANT
ELECT REM PT RETURN 9FT ADLT (ELECTROSURGICAL) ×1
ELECTRODE REM PT RTRN 9FT ADLT (ELECTROSURGICAL) ×1 IMPLANT
GLOVE BIOGEL PI IND STRL 9 (GLOVE) ×1 IMPLANT
GLOVE SURG ORTHO 9.0 STRL STRW (GLOVE) ×1 IMPLANT
GOWN STRL REUS W/ TWL XL LVL3 (GOWN DISPOSABLE) ×2 IMPLANT
GOWN STRL REUS W/TWL XL LVL3 (GOWN DISPOSABLE) ×2
KIT BASIN OR (CUSTOM PROCEDURE TRAY) ×1 IMPLANT
KIT TURNOVER KIT B (KITS) ×1 IMPLANT
MANIFOLD NEPTUNE II (INSTRUMENTS) ×1 IMPLANT
NS IRRIG 1000ML POUR BTL (IV SOLUTION) ×1 IMPLANT
PACK ORTHO EXTREMITY (CUSTOM PROCEDURE TRAY) ×1 IMPLANT
PAD ARMBOARD 7.5X6 YLW CONV (MISCELLANEOUS) ×1 IMPLANT
PREVENA RESTOR ARTHOFORM 46X30 (CANNISTER) ×1 IMPLANT
PREVENA RESTOR AXIOFORM 29X28 (GAUZE/BANDAGES/DRESSINGS) IMPLANT
SPONGE T-LAP 18X18 ~~LOC~~+RFID (SPONGE) IMPLANT
STAPLER VISISTAT 35W (STAPLE) IMPLANT
STOCKINETTE IMPERVIOUS LG (DRAPES) ×1 IMPLANT
SUT ETHILON 2 0 PSLX (SUTURE) IMPLANT
SUT SILK 2 0 (SUTURE) ×1
SUT SILK 2-0 18XBRD TIE 12 (SUTURE) ×1 IMPLANT
SUT VIC AB 1 CTX 27 (SUTURE) ×2 IMPLANT
SUT VIC AB 1 CTX 36 (SUTURE) ×2
SUT VIC AB 1 CTX36XBRD ANBCTR (SUTURE) IMPLANT
TOWEL GREEN STERILE (TOWEL DISPOSABLE) ×1 IMPLANT
TUBE CONNECTING 12X1/4 (SUCTIONS) ×1 IMPLANT
YANKAUER SUCT BULB TIP NO VENT (SUCTIONS) ×1 IMPLANT

## 2022-08-15 NOTE — Anesthesia Procedure Notes (Signed)
Anesthesia Regional Block: Popliteal block   Pre-Anesthetic Checklist: , timeout performed,  Correct Patient, Correct Site, Correct Laterality,  Correct Procedure, Correct Position, site marked,  Risks and benefits discussed,  Surgical consent,  Pre-op evaluation,  At surgeon's request and post-op pain management  Laterality: Left  Prep: chloraprep       Needles:  Injection technique: Single-shot  Needle Type: Echogenic Stimulator Needle     Needle Length: 5cm  Needle Gauge: 22     Additional Needles:   Procedures:, nerve stimulator,,, ultrasound used (permanent image in chart),,     Nerve Stimulator or Paresthesia:  Response: foot, 0.45 mA  Additional Responses:   Narrative:  Start time: 08/15/2022 1:00 PM End time: 08/15/2022 1:08 PM Injection made incrementally with aspirations every 5 mL.  Performed by: Personally  Anesthesiologist: Bethena Midget, MD  Additional Notes: Functioning IV was confirmed and monitors were applied.  A 74mm 22ga Arrow echogenic stimulator needle was used. Sterile prep and drape,hand hygiene and sterile gloves were used. Ultrasound guidance: relevant anatomy identified, needle position confirmed, local anesthetic spread visualized around nerve(s)., vascular puncture avoided.  Image printed for medical record. Negative aspiration and negative test dose prior to incremental administration of local anesthetic. The patient tolerated the procedure well.

## 2022-08-15 NOTE — Progress Notes (Signed)
Subjective: 1 Day Post-Op Procedure(s) (LRB): AMPUTATION BELOW KNEE (Left)  Patient resting comfortably in bed.  Wife at bedside.  Objective:   VITALS:  Temp:  [97.6 F (36.4 C)-97.8 F (36.6 C)] 97.8 F (36.6 C) (04/05 0448) Pulse Rate:  [96-97] 96 (04/05 0448) Resp:  [17] 17 (04/05 0448) BP: (147-172)/(86-105) 147/86 (04/05 0448) SpO2:  [99 %-100 %] 99 % (04/05 0448)  Obese male in no apparent distress. Alert and oriented. Extraocular motions are intact. Respirations are unlabored. Left leg has brawny edema from the proximal tibia down to the foot. The left foot has a large plantar ulcer beneath the lateral rays extending back to the cuboid. There is bone and tendon exposed distally in the vicinity of the fourth and fifth MTP joints. Brisk capillary refill at the toes. Diminished sensibility to light touch at the forefoot. No lymphadenopathy or lymphangitis.    LABS Recent Labs    08/13/22 1500 08/14/22 0500 08/15/22 0309  HGB 9.8* 8.7* 8.7*  WBC 10.9* 8.7 9.3  PLT 402* 367 388   Recent Labs    08/14/22 0500 08/15/22 0309  NA 130* 134*  K 3.1* 3.5  CL 104 104  CO2 21* 24  BUN 9 6  CREATININE 1.16 1.18  GLUCOSE 155* 130*   No results for input(s): "LABPT", "INR" in the last 72 hours.   Assessment/Plan: 1 Day Post-Op Procedure(s) (LRB): AMPUTATION BELOW KNEE (Left)  Spoke to patient and his wife at length this morning.  Informed him that Dr. Victorino Dike spoke to his podiatrist in [name], Dr. Marcheta Grammes, and that Dr. Marcheta Grammes stated that L BKA would have been reasonable 6 months ago and is certainly reasonable now.  The patient notes understanding.  The earliest Dr. Victorino Dike can perform the procedure would be this upcoming Tuesday.  Dr. Lajoyce Corners has graciously agreed to see the patient and has tentatively put him on his schedule for left BKA today.  The patient will meet with Dr. Lajoyce Corners and decide whether he would like Dr. Lajoyce Corners to do the case today versus waiting until Tuesday for Dr. Victorino Dike.   I recommended that the patient have his source of infection resolved today.  Continue n.p.o. status.  If the patient elects for Dr. Victorino Dike to perform the surgery next week please inform me and I will place surgery orders for the patient later today.  Alfredo Martinez PA-C [facility] Office:  [phone]

## 2022-08-15 NOTE — Anesthesia Preprocedure Evaluation (Signed)
Anesthesia Evaluation  Patient identified by MRN, date of birth, ID band Patient awake    Reviewed: Allergy & Precautions, H&P , NPO status , Patient's Chart, lab work & pertinent test results  Airway Mallampati: II  TM Distance: >3 FB Neck ROM: Full    Dental no notable dental hx.    Pulmonary neg pulmonary ROS   Pulmonary exam normal breath sounds clear to auscultation       Cardiovascular hypertension, Normal cardiovascular exam Rhythm:Regular Rate:Normal     Neuro/Psych negative neurological ROS  negative psych ROS   GI/Hepatic negative GI ROS, Neg liver ROS,,,  Endo/Other  diabetes  Morbid obesity  Renal/GU negative Renal ROS  negative genitourinary   Musculoskeletal negative musculoskeletal ROS (+)    Abdominal   Peds negative pediatric ROS (+)  Hematology negative hematology ROS (+)   Anesthesia Other Findings   Reproductive/Obstetrics negative OB ROS                             Anesthesia Physical Anesthesia Plan  ASA: 3  Anesthesia Plan: General   Post-op Pain Management: Dilaudid IV   Induction: Intravenous  PONV Risk Score and Plan: 2 and Ondansetron and Treatment may vary due to age or medical condition  Airway Management Planned: LMA and Oral ETT  Additional Equipment:   Intra-op Plan:   Post-operative Plan: Extubation in OR  Informed Consent: I have reviewed the patients History and Physical, chart, labs and discussed the procedure including the risks, benefits and alternatives for the proposed anesthesia with the patient or authorized representative who has indicated his/her understanding and acceptance.     Dental advisory given  Plan Discussed with: CRNA and Surgeon  Anesthesia Plan Comments: (Has had #5 LMA's previously)       Anesthesia Quick Evaluation  

## 2022-08-15 NOTE — Consult Note (Signed)
ORTHOPAEDIC CONSULTATION  REQUESTING PHYSICIAN: Lorin Glassahal, Binaya, MD  Chief Complaint: Chronic ulceration osteomyelitis left foot.  HPI: Alan Ginsbergdward Kempe Jr. is a 47 y.o. 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  Patient has had previous toe amputation for local infection.  Patient presents at this time with a left foot large plantar necrotic ulcer with osteomyelitis.  Past Medical History:  Diagnosis Date   Hypertension    Uncontrolled type 2 diabetes mellitus with hyperglycemia, with long-term current use of insulin 06/06/2022   History reviewed. No pertinent surgical history. Social History   Socioeconomic History   Marital status: Single    Spouse name: Not on file   Number of children: Not on file   Years of education: Not on file   Highest education level: Not on file  Occupational History   Not on file  Tobacco Use   Smoking status: Never   Smokeless tobacco: Never  Substance and Sexual Activity   Alcohol use: No   Drug use: No   Sexual activity: Not on file  Other Topics Concern   Not on file  Social History Narrative   Not on file   Social Determinants of Health   Financial Resource Strain: Not on file  Food Insecurity: No Food Insecurity (08/13/2022)   Hunger Vital Sign    Worried About Running Out of Food in the Last Year: Never true    Ran Out of Food in the Last Year: Never true  Transportation Needs: No Transportation Needs (08/13/2022)   PRAPARE - Administrator, Civil ServiceTransportation    Lack of Transportation (Medical): No    Lack of Transportation (Non-Medical): No  Physical Activity: Not on file  Stress: Not on file  Social Connections: Not on file   Family History  Problem Relation Age of Onset   Arthritis Mother    Allergies Father    Hypertension Father    Diabetes Father    - negative except otherwise stated in the family history section Allergies  Allergen Reactions   Benadryl  [Diphenhydramine] Swelling    Pt reports cannot take pill form of benadryl   Prior to Admission medications   Medication Sig Start Date End Date Taking? Authorizing Provider  ibuprofen (ADVIL,MOTRIN) 200 MG tablet Take 800 mg by mouth daily.   Yes [provider]  oxycodone (OXY-IR) 5 MG capsule Take 5 mg by mouth every 4 (four) hours as needed for pain. Q4-6 hours 06/16/22  Yes [provider]  vancomycin IVPB Inject 1,000 mg into the vein. 06/16/22 09/18/22 Yes [provider]   MRI Left foot without contrast  Result Date: 08/13/2022 CLINICAL DATA:  Soft tissue infection suspected, foot, xray done EXAM: MRI OF THE LEFT FOOT WITHOUT CONTRAST TECHNIQUE: Multiplanar, multisequence MR imaging of the left foot was performed. No intravenous contrast was administered. 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 There is prominent adjacent heterotopic ossification and callus formation, demonstrating increased edema signal and some areas of low T1 signal. No significant marrow abnormality involving the second or third rays. There is marrow edema throughout the fourth toe phalanges and fourth metatarsal. There is confluent low T1 marrow signal in the periarticular fourth toe proximal phalanx and metatarsal head at the MTP joint with and MTP joint effusion. There is marrow edema and confluent low T1 signal throughout the fifth toe proximal phalanx and fifth metatarsal.  There is extensive marrow edema within the plantar aspect of the cuboid with preserved T1 marrow signal. Very mild periarticular middle and lateral cuneiforms marrow edema at the TMT joints, with preserved T1 marrow signal, likely reactive. Ligaments Intact Lisfranc ligament. Muscles and Tendons Denervation changes of the foot musculature. Soft tissues Extensive skin thickening and soft tissue swelling of the  foot. There is a large lateral plantar wound with notable tract extending to the base of the fifth metatarsal. There is underlying confluent fluid and soft tissue gas related to the ulcer/wound measuring up to 1.0 cm short axis and extends to the surface of the base of the fifth metatarsal. IMPRESSION: Large plantar foot wound with lateral midfoot tract extending to the base of the fifth metatarsal. Underlying confluent fluid in the lateral midfoot measuring up to 1.0 cm short axis which could reflect a fluid collection/abscess or coalescent edema, correlate with drainage. Osteomyelitis of the fifth toe proximal phalanx and of the entire fifth metatarsal. Septic arthritis and periarticular osteomyelitis of the fourth toe MTP joint. Marrow edema involvement includes all of the fourth toe phalanges and the entirety of the fourth metatarsal. 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 Marrow edema within the plantar aspect of the cuboid which could reflect reactive marrow change or early osteomyelitis. Extensive soft tissue swelling of the foot. Electronically Signed   By: Caprice RenshawJacob  Kahn M.D.   On: 08/13/2022 14:56   DG Foot Complete Left  Result Date: 08/13/2022 CLINICAL DATA:  Foot wound. Osteomyelitis. None cellulitis and osteomyelitis. Nonhealing ulcer on bottom of left foot for several months. Patient was being treated in [place] with IV antibiotics through a PICC line, and had discussion about proceeding to wound VAC. Patient reports he had to move to [place] suddenly and has not had any IV antibiotics for 4 weeks. EXAM: LEFT FOOT - COMPLETE 3+ VIEW COMPARISON:  Left foot radiographs 06/06/2022 FINDINGS: There is high-grade soft tissue swelling. Postsurgical changes are again seen of amputation of the first digit to the  mid metatarsal shaft. Unchanged chronic bone formation abutting the distal lateral aspect of the first metatarsal. There is a new soft tissue ulcer lateral to the proximal 50% of the fifth metatarsal shaft. Note is made that subcutaneous air was previously seen lateral to the distal fifth metatarsal, there were findings concerning for osteomyelitis on both sides of the fifth metatarsophalangeal joint. This distal fifth metatarsal and proximal aspect of the proximal phalanx cortical erosion has progressed. There is diffuse periosteal reaction along the fifth metatarsal shaft. There is also new mild erosion at the lateral base of the fifth metatarsal. There is new mild-to-moderate erosion at the distal fourth metatarsal and proximal lateral aspect of the fourth proximal phalanx. Moderate to large plantar and posterior calcaneal heel spurs. 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 IMPRESSION: 1. High-grade soft tissue swelling. 2. New soft tissue ulcer lateral to the proximal 50% of the fifth metatarsal shaft. 3. Progression of osteomyelitis of the fifth digit distal aspect of the metatarsal and proximal aspect of the proximal phalanx. New erosion at the lateral base of the fifth metatarsal. 4. New mild-to-moderate erosion at the distal fourth metatarsal and proximal lateral aspect of the fourth proximal phalanx. 5. These findings are highly suspicious for progressive osteomyelitis. Electronically Signed   By: Neita Garnetonald  Viola M.D.   On: 08/13/2022 13:27   -  pertinent xrays, CT, MRI studies were reviewed and independently interpreted  Positive ROS: All other systems have been reviewed and were otherwise negative with the exception of those mentioned in the HPI and as above.  Physical Exam: General: Alert, no acute distress Psychiatric: Patient is competent for consent with normal mood and affect Lymphatic: No axillary or cervical lymphadenopathy Cardiovascular: No pedal edema Respiratory: No  cyanosis, no use of accessory musculature GI: No organomegaly, abdomen is soft and non-tender    Images:  @ENCIMAGES @  Labs:  Lab Results  Component Value Date   HGBA1C 10.3 (H) 08/13/2022   HGBA1C 12.3 (H) 06/07/2022   ESRSEDRATE 107 (H) 08/13/2022   ESRSEDRATE 119 (H) 06/07/2022   CRP 10.5 (H) 06/07/2022   REPTSTATUS PENDING 08/13/2022   CULT  08/13/2022    NO GROWTH < 12 HOURS Performed at [facility], [address], [place], [name] [address]     Lab Results  Component Value Date   ALBUMIN <1.5 (L) 06/07/2022   ALBUMIN <1.5 (L) 06/06/2022   PREALBUMIN 8 (L) 08/13/2022        Latest Ref Rng & Units 08/15/2022    3:09 AM 08/14/2022    5:00 AM 08/13/2022    3:00 PM  CBC EXTENDED  WBC 4.0 - 10.5 K/uL 9.3  8.7  10.9   RBC 4.22 - 5.81 MIL/uL 3.90  3.73  4.35   Hemoglobin 13.0 - 17.0 g/dL 8.7  8.7  9.8   HCT 32.9 - 52.0 % 29.1  26.8  32.5   Platelets 150 - 400 K/uL 388  367  402   NEUT# 1.7 - 7.7 K/uL 6.7     Lymph# 0.7 - 4.0 K/uL 1.7       Neurologic: Patient does not have protective sensation bilateral lower extremities.   MUSCULOSKELETAL:   Skin: Examination there is a large ulcer plantar aspect of the left foot that extends to bone.  Patient has brawny lymphedema and venous insufficiency with capillary skin changes brawny edema with hyperpigmentation.  I cannot palpate a pulse secondary to the swelling.  Hemoglobin 8.7 with a white cell count of 9.3.  Albumin less than 1.5 with a hemoglobin A1c of 10.3.  Hemoglobin A1c in January was 12.3.  Review of the MRI scan shows osteomyelitis and ulceration.  Edema in the hindfoot consistent with early osteomyelitis.  Assessment: Assessment: Ulceration and osteomyelitis left foot with uncontrolled type 2 diabetes and severe protein caloric malnutrition with venous and lymphatic insufficiency.  Severe fish allergy.  Plan: Will plan for a left transtibial amputation.  Risk and benefits were discussed including  increased risk of the wound not healing secondary to the venous and lymphatic insufficiency.  Will not be able to use the Kerecis tissue graft due to his fish allergy.  Will plan for circumferential negative pressure therapy a compression sleeve and limb protector.  Patient states he understands and wishes to proceed with surgery at this time.  Thank you for the consult and the opportunity to see Mr. Frann Rider, MD [facility] [phone] 8:38 AM

## 2022-08-15 NOTE — Progress Notes (Signed)
Called at bedside by RN after patient reported that he was feeling like he was going to pass out. Just returned back from PACU after left BKA Presents stable with heart rate in 80s, blood pressure in 170s, breathing on room air Patient looks like he is under the influence of Precedex that was given earlier in PACU for pain relief. He fell asleep in the middle of conversation but was arousable to follow commands Wife at bedside  At this time, patient does not seem to be on active pain.  He is hemodynamically stable as well. IV Dilaudid as needed available to be given by nurse if he seems to be in pain.

## 2022-08-15 NOTE — Op Note (Signed)
08/15/2022  2:10 PM  PATIENT:  Alan Tapia.    PRE-OPERATIVE DIAGNOSIS:  Osteomyelitis Left Foot  POST-OPERATIVE DIAGNOSIS:  Same  PROCEDURE:  LEFT BELOW KNEE AMPUTATION  Application of Prevena customizable and Prevena arthroform wound VAC dressings Application of Vive Wear stump shrinker and the Hanger limb protector  SURGEON:  Nadara Mustard, MD  ANESTHESIA:   General  PREOPERATIVE INDICATIONS:  [name]. is a  47 y.o. male with a diagnosis of Osteomyelitis Left Foot who failed conservative measures and elected for surgical management.    The risks benefits and alternatives were discussed with the patient preoperatively including but not limited to the risks of infection, bleeding, nerve injury, cardiopulmonary complications, the need for revision surgery, among others, and the patient was willing to proceed.  OPERATIVE IMPLANTS:   * No implants in log *   OPERATIVE FINDINGS: venous and lymphatic insufficiency  OPERATIVE PROCEDURE: Patient was brought to the operating room after undergoing a regional anesthetic.  After adequate levels anesthesia were obtained a thigh tourniquet was placed and the lower extremity was prepped using DuraPrep draped into a sterile field. The foot was draped out of the sterile field with impervious stockinette.  A timeout was called and the tourniquet inflated.  A transverse skin incision was made 12 cm distal to the tibial tubercle, the incision curved proximally, and a large posterior flap was created.  The tibia was transected just proximal to the skin incision and beveled anteriorly.  The fibula was transected just proximal to the tibial incision.  The sciatic nerve was pulled cut and allowed to retract.  The vascular bundles were suture ligated with 2-0 silk.  The tourniquet was deflated and hemostasis obtained.     The deep and superficial fascial layers were closed using #1 Vicryl.  The skin was closed using staples.    The Prevena  customizable dressing was applied this was overwrapped with the arthroform sponge.  Collier Flowers was used to secure the sponges and the circumferential compression was secured to the skin with Dermatac.  This was connected to the wound VAC pump and had a good suction fit this was covered with a stump shrinker and a limb protector.  Patient was taken to the PACU in stable condition.   DISCHARGE PLANNING:  Antibiotic duration: 24-hour antibiotics  Weightbearing: Nonweightbearing on the operative extremity  Pain medication: Opioid pathway  Dressing care/ Wound VAC: Continue wound VAC with the Prevena plus pump at discharge for 1 week  Ambulatory devices: Walker or kneeling scooter  Discharge to: Discharge planning based on recommendations per physical therapy  Follow-up: In the office 1 week after discharge.

## 2022-08-15 NOTE — Transfer of Care (Signed)
Immediate Anesthesia Transfer of Care Note  Patient: Alan Tapia.  Procedure(s) Performed: LEFT BELOW KNEE AMPUTATION (Left: Knee)  Patient Location: PACU  Anesthesia Type:General  Level of Consciousness: awake and alert   Airway & Oxygen Therapy: Patient Spontanous Breathing and Patient connected to face mask oxygen  Post-op Assessment: Report given to RN and Post -op Vital signs reviewed and stable  Post vital signs: Reviewed and stable  Last Vitals:  Vitals Value Taken Time  BP 151/94 08/15/22 1408  Temp    Pulse 90 08/15/22 1410  Resp 15 08/15/22 1410  SpO2 90 % 08/15/22 1410  Vitals shown include unvalidated device data.  Last Pain:  Vitals:   08/15/22 1234  TempSrc:   PainSc: 4       Patients Stated Pain Goal: 3 (08/15/22 1234)  Complications: No notable events documented.

## 2022-08-15 NOTE — Progress Notes (Signed)
PROGRESS NOTE  Alan GinsbergEdward Nesmith Jr.  DOB: 1975/06/26  PCP: Patient, No Pcp Per [facility]:[id number]  DOA: 08/13/2022  LOS: 2 days  Hospital Day: 3  Brief narrative: Alan Ginsbergdward Latif Jr. is a 47 y.o. male with PMH significant for morbid obesity, uncontrolled DM, HTN who has a 322-month history of a left foot ulcer for which she was seeing a podiatrist weekly for debridement at [place], last debridement 06/10/2022. He recently moved to [place] due to a family emergency but he was traveling back-and-forth to see the same podiatrist in [place].  He also had a PICC line and was receiving vancomycin infusions.  PICC line fell out before completion of the antibiotic course for which he did not follow-up.   4/3, presented to ED with complaint of progressive swelling, and clear yellow discharge from the foot.  In the ED, patient was afebrile, hemodynamically stable and breathing on room air. MRI left foot showed large plantar foot wound with possible septic arthritis, abscess and worsening osteomyelitis.   EDP discussed with orthopedics Dr. Victorino DikeHewitt  [facility]  Subjective: Patient was seen and examined this morning.   Propped up in bed.  Pain controlled.   Wife at bedside.   Patient and his wife are both agreeable to proceed with surgery today. Seen by Dr. Lajoyce Cornersuda this morning.  Surgery planned for this afternoon.    Assessment and plan: Acute on chronic osteomyelitis, septic arthritis, abscess  Chronic nonhealing left foot diabetic ulcer History and imagings as above Planned for left BKA by Dr. Lajoyce Cornersuda today. Currently on IV ceftriaxone, IV Flagyl and oral Zyvox.  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  Repeat A1c on 4/3 is better at 10.3. PTA on on Lantus 20 units daily and Humalog 15 units 3 times  daily. Currently only on sliding scale insulin with Accu-Cheks.  Continue to monitor.  May need long-acting insulin started as well. Recent Labs  Lab 08/14/22 0943 08/14/22 1107 08/14/22 1603 08/14/22 1934 08/15/22 0806  GLUCAP 132* 154* 134* 129* 125*    Essential hypertension Chronic bilateral lower extremity edema PTA supposed to be on Norvasc and losartan but he apparently was not taking them.   Not on any diuretics. Blood pressure mostly elevated.  Plan to start losartan from tomorrow.  May also need diuretics.    Hypokalemia Potassium low at 3.1.  IV replacement ordered. Recent Labs  Lab 08/13/22 1500 08/14/22 0500 08/15/22 0309  K 3.2* 3.1* 3.5     Morbid Obesity  Body mass index is 45.52 kg/m. Patient has been advised to make an attempt to improve diet and exercise patterns to aid in weight loss.  Mobility: Needs PT eval postprocedure  Goals of care   Code Status: Full Code     DVT prophylaxis: Currently not on DVT prophylaxis.  Will order after surgery   Antimicrobials: IV Rocephin, IV Flagyl and Zyvox Fluid: Currently on NS at 75 mill per hour Consultants: Orthopedics Family Communication: Wife at bedside  Status: Inpatient Level of care:  Telemetry Medical   Needs to continue in-hospital care:  Needs left BKA  Patient from: Home Anticipated d/c to: Pending clinical course      Diet:  Diet Order             Diet NPO time specified  Diet effective  ____                   Scheduled Meds:  chlorhexidine  60 mL Topical Once   insulin aspart  0-20 Units Subcutaneous TID WC   insulin aspart  0-5 Units Subcutaneous QHS   [START ON 08/16/2022] losartan  50 mg Oral Daily   multivitamin with minerals  1 tablet Oral Daily   povidone-iodine  2 Application Topical Once    PRN meds: acetaminophen **OR** acetaminophen, hydrALAZINE, hydrocortisone cream, ibuprofen, LORazepam, LORazepam, ondansetron **OR** ondansetron (ZOFRAN) IV, oxyCODONE    Infusions:   sodium chloride 75 mL/hr at 08/15/22 0656   cefTRIAXone (ROCEPHIN)  IV 2 g (08/14/22 2200)   linezolid (ZYVOX) IV 600 mg (08/15/22 0945)   metronidazole 500 mg (08/15/22 0317)    Antimicrobials: Anti-infectives (From admission, onward)    Start     Dose/Rate Route Frequency Ordered Stop   08/14/22 1300  ceFAZolin (ANCEF) IVPB 3g/100 mL premix        3 g 200 mL/hr over 30 Minutes Intravenous On call to O.R. 08/14/22 1128 08/15/22 0559   08/14/22 0215  linezolid (ZYVOX) IVPB 600 mg       Note to Pharmacy: Patient not tolerating vanc IV even at lower rate, changing to zyvox. Patient denies history of past zyvox reaction. First vanc dose was not fully given.   600 mg 300 mL/hr over 60 Minutes Intravenous Every 12 hours 08/14/22 0124     08/14/22 0200  vancomycin (VANCOREADY) IVPB 2000 mg/400 mL  Status:  Discontinued        2,000 mg 100 mL/hr over 240 Minutes Intravenous Every 12 hours 08/13/22 1750 08/14/22 0225   08/13/22 2200  cefTRIAXone (ROCEPHIN) 2 g in sodium chloride 0.9 % 100 mL IVPB        2 g 200 mL/hr over 30 Minutes Intravenous Every 24 hours 08/13/22 1646 08/20/22 2159   08/13/22 1345  vancomycin (VANCOREADY) IVPB 2000 mg/400 mL        2,000 mg 200 mL/hr over 120 Minutes Intravenous  Once 08/13/22 1311 08/13/22 1626   08/13/22 1315  metroNIDAZOLE (FLAGYL) IVPB 500 mg        500 mg 100 mL/hr over 60 Minutes Intravenous Every 12 hours 08/13/22 1307 08/20/22 1559   08/13/22 1315  ceFEPIme (MAXIPIME) 2 g in sodium chloride 0.9 % 100 mL IVPB        2 g 200 mL/hr over 30 Minutes Intravenous  Once 08/13/22 1311 08/13/22 1406       Nutritional status:  Body mass index is 45.52 kg/m.  Nutrition Problem: Increased nutrient needs Etiology: wound healing, post-op healing Signs/Symptoms: estimated needs     Objective: Vitals:   08/15/22 0448 08/15/22 0803  BP: (!) 147/86 (!) 171/100  Pulse: 96 100  Resp: 17 20  Temp: 97.8 F (36.6 C) 97.7 F (36.5  C)  SpO2: 99% 99%    Intake/Output Summary (Last 24 hours) at 08/15/2022 1152 Last data filed at 08/15/2022 0700 Gross per 24 hour  Intake 1670.31 ml  Output --  Net 1670.31 ml    Filed Weights   08/13/22 1206  Weight: (!) 169.6 kg   Weight change:  Body mass index is 45.52 kg/m.   Physical Exam: General exam: Pleasant, middle-aged African-American male. Skin: No rashes, lesions or ulcers. HEENT: Atraumatic, normocephalic, no obvious bleeding Lungs: Clear to auscultation bilaterally CVS: Regular rate and rhythm, no murmur GI/Abd soft, nontender, nondistended, bowel sound present CNS: Alert,  awake, oriented x 3 Psychiatry: Mood appropriate Extremities: Chronic bilateral lower extremity edema left more than right.  Left foot wound is wrapped.  Data Review: I have personally reviewed the laboratory data and studies available.  F/u labs ordered Unresulted Labs (From admission, onward)     Start     Ordered   08/20/22 0500  Creatinine, serum  (enoxaparin (LOVENOX)    CrCl >/= 30 ml/min)  Weekly,   R     Comments: while on enoxaparin therapy    08/13/22 1846   08/15/22 1151  Glucose, capillary  Once,   R        08/15/22 1151   08/15/22 0500  CBC with Differential/Platelet  Daily,   R      08/14/22 1133   08/15/22 0500  Basic metabolic panel  Daily,   R      08/14/22 1133   08/13/22 1231  C-reactive protein  Once,   URGENT        08/13/22 1230            Total time spent in review of labs and imaging, patient evaluation, formulation of plan, documentation and communication with family: 45 minutes  Signed, Lorin Glass, MD [facility] 08/15/2022

## 2022-08-15 NOTE — Plan of Care (Signed)
  Problem: Pain Managment: Goal: General experience of comfort will improve Outcome: Progressing   Problem: Safety: Goal: Ability to remain free from injury will improve Outcome: Progressing   Problem: Skin Integrity: Goal: Risk for impaired skin integrity will decrease Outcome: Progressing   

## 2022-08-15 NOTE — Progress Notes (Signed)
   Palliative Medicine Inpatient Follow Up Note   Mr. Alan Tapia is a 47 year old male with a past medical history significant for uncontrolled diabetes complains of a 27-month history of a left foot ulcer. Originally from [name] where this was being treated though noted to have issues with compliance. At this point the recommendation of the IP Orthopaedics team and patients podiatrist, Dr. Marcheta Grammes in [name] are for a L BKA. This will be planned during hospitalization.    Palliative care received a consult which per Dr. Pola Corn was an error. Consult has since been removed.   No Charge.  ______________________________________________________________________________________ Alan Tapia [place] Palliative Medicine Team Team Cell Phone: [phone] Please utilize secure chat with additional questions, if there is no response within 30 minutes please call the above phone number  Palliative Medicine Team providers are available by phone from 7am to 7pm daily and can be reached through the team cell phone.  Should this patient require assistance outside of these hours, please call the patient's attending physician.

## 2022-08-15 NOTE — Progress Notes (Signed)
Pt returned to unit skin wet clammy and cold VS were taken see flowsheets (177/96)  BS was 146. Pt was stating he felt he would pass out had to arouse him several times with a sternal rub, Diaphoretic and slow to respond when answering questionings. Called Dr Pola Corn to evaluate.

## 2022-08-15 NOTE — Progress Notes (Signed)
Pt asked if he was on schedule for a procedure tomorrow.  RN verified that he was and told pt time and that he was ordered to be NPO.  Pt states that he does not consent to the surgery because the MD did not discuss the plan with him.

## 2022-08-15 NOTE — Progress Notes (Signed)
Patient c/o severe pain not relieved by 2mg  Dilaudid IVP. notified Anesthesia provider Dr. Harlon Ditty who administered Precedex IVP to patient in PACU. Patient was able to have pain relief with this and was able to fall asleep.

## 2022-08-15 NOTE — Anesthesia Procedure Notes (Signed)
Procedure Name: LMA Insertion Date/Time: 08/15/2022 1:12 PM  Performed by: Montez Morita, Areesha Dehaven W, CRNAPre-anesthesia Checklist: Patient identified, Emergency Drugs available, Suction available and Patient being monitored Patient Re-evaluated:Patient Re-evaluated prior to induction Oxygen Delivery Method: Circle system utilized Preoxygenation: Pre-oxygenation with 100% oxygen Induction Type: IV induction Ventilation: Mask ventilation without difficulty LMA: LMA inserted LMA Size: 5.0 Number of attempts: 1 Airway Equipment and Method: Bite block Placement Confirmation: positive ETCO2 Tube secured with: Tape Dental Injury: Teeth and Oropharynx as per pre-operative assessment

## 2022-08-16 DIAGNOSIS — M86272 Subacute osteomyelitis, left ankle and foot: Secondary | ICD-10-CM | POA: Diagnosis not present

## 2022-08-16 LAB — CBC WITH DIFFERENTIAL/PLATELET
Abs Immature Granulocytes: 0.05 10*3/uL (ref 0.00–0.07)
Basophils Absolute: 0.1 10*3/uL (ref 0.0–0.1)
Basophils Relative: 1 %
Eosinophils Absolute: 0 10*3/uL (ref 0.0–0.5)
Eosinophils Relative: 0 %
HCT: 26.4 % — ABNORMAL LOW (ref 39.0–52.0)
Hemoglobin: 8.1 g/dL — ABNORMAL LOW (ref 13.0–17.0)
Immature Granulocytes: 1 %
Lymphocytes Relative: 14 %
Lymphs Abs: 1.2 10*3/uL (ref 0.7–4.0)
MCH: 22.8 pg — ABNORMAL LOW (ref 26.0–34.0)
MCHC: 30.7 g/dL (ref 30.0–36.0)
MCV: 74.2 fL — ABNORMAL LOW (ref 80.0–100.0)
Monocytes Absolute: 0.7 10*3/uL (ref 0.1–1.0)
Monocytes Relative: 8 %
Neutro Abs: 6.6 10*3/uL (ref 1.7–7.7)
Neutrophils Relative %: 76 %
Platelets: 367 10*3/uL (ref 150–400)
RBC: 3.56 MIL/uL — ABNORMAL LOW (ref 4.22–5.81)
RDW: 15.2 % (ref 11.5–15.5)
WBC: 8.6 10*3/uL (ref 4.0–10.5)
nRBC: 0 % (ref 0.0–0.2)

## 2022-08-16 LAB — GLUCOSE, CAPILLARY
Glucose-Capillary: 153 mg/dL — ABNORMAL HIGH (ref 70–99)
Glucose-Capillary: 173 mg/dL — ABNORMAL HIGH (ref 70–99)
Glucose-Capillary: 194 mg/dL — ABNORMAL HIGH (ref 70–99)
Glucose-Capillary: 177 mg/dL — ABNORMAL HIGH (ref 70–99)

## 2022-08-16 LAB — BASIC METABOLIC PANEL
Anion gap: 8 (ref 5–15)
BUN: 7 mg/dL (ref 6–20)
CO2: 23 mmol/L (ref 22–32)
Calcium: 7.4 mg/dL — ABNORMAL LOW (ref 8.9–10.3)
Chloride: 101 mmol/L (ref 98–111)
Creatinine, Ser: 1.11 mg/dL (ref 0.61–1.24)
GFR, Estimated: >60 mL/min (ref >60)
Glucose, Bld: 247 mg/dL — ABNORMAL HIGH (ref 70–99)
Potassium: 3.4 mmol/L — ABNORMAL LOW (ref 3.5–5.1)
Sodium: 132 mmol/L — ABNORMAL LOW (ref 135–145)

## 2022-08-16 MED ORDER — ACETAMINOPHEN 500 MG PO TABS
1000.0000 mg | ORAL_TABLET | Freq: Three times a day (TID) | ORAL | Status: DC
  Administered 2022-08-16 – 2022-08-22 (×17): 1000 mg via ORAL
  Filled 2022-08-16 (×18): qty 2

## 2022-08-16 MED ORDER — OXYCODONE HCL 5 MG PO TABS: 5.0000 mg | ORAL_TABLET | Freq: Four times a day (QID) | ORAL | Status: DC | PRN

## 2022-08-16 MED ORDER — ENOXAPARIN SODIUM 80 MG/0.8ML IJ SOSY
80.0000 mg | PREFILLED_SYRINGE | INTRAMUSCULAR | Status: DC
  Administered 2022-08-16 – 2022-08-21 (×6): 80 mg via SUBCUTANEOUS
  Filled 2022-08-16 (×8): qty 0.8

## 2022-08-16 MED ORDER — HYDROMORPHONE HCL 1 MG/ML IJ SOLN
1.0000 mg | INTRAMUSCULAR | Status: DC | PRN
  Administered 2022-08-16: 1 mg via INTRAVENOUS
  Filled 2022-08-16 (×3): qty 1

## 2022-08-16 MED ORDER — FUROSEMIDE 40 MG PO TABS
40.0000 mg | ORAL_TABLET | Freq: Every day | ORAL | Status: DC
  Administered 2022-08-16 – 2022-08-20 (×5): 40 mg via ORAL
  Filled 2022-08-16 (×5): qty 1

## 2022-08-16 MED ORDER — OXYCODONE HCL 5 MG PO TABS
5.0000 mg | ORAL_TABLET | Freq: Four times a day (QID) | ORAL | Status: AC
  Administered 2022-08-16 – 2022-08-17 (×3): 5 mg via ORAL
  Filled 2022-08-16 (×8): qty 1

## 2022-08-16 MED ORDER — POTASSIUM CHLORIDE CRYS ER 20 MEQ PO TBCR
40.0000 meq | EXTENDED_RELEASE_TABLET | Freq: Once | ORAL | Status: AC
  Administered 2022-08-16: 40 meq via ORAL
  Filled 2022-08-16: qty 2

## 2022-08-16 MED ORDER — INSULIN GLARGINE-YFGN 100 UNIT/ML ~~LOC~~ SOLN
10.0000 [IU] | Freq: Every day | SUBCUTANEOUS | Status: DC
  Administered 2022-08-16 – 2022-08-20 (×5): 10 [IU] via SUBCUTANEOUS
  Filled 2022-08-16 (×5): qty 0.1

## 2022-08-16 NOTE — Progress Notes (Signed)
PROGRESS NOTE  Alan Tapia.  DOB: 1975-08-09  PCP: Patient, No Pcp Per KMQ:[id number]  DOA: 08/13/2022  LOS: 3 days  Hospital Day: 4  Brief narrative: Alan Eiche. is a 47 y.o. male with PMH significant for morbid obesity, uncontrolled DM, HTN who has a 62-month history of a left foot ulcer for which she was seeing a podiatrist weekly for debridement at [name], last debridement 06/10/2022. He recently moved to [place] due to a family emergency but he was traveling back-and-forth to see the same podiatrist in [name].  He also had a PICC line and was receiving vancomycin infusions.  PICC line fell out before completion of the antibiotic course for which he did not follow-up.   4/3, presented to ED with complaint of progressive swelling, and clear yellow discharge from the foot.  In the ED, patient was afebrile, hemodynamically stable and breathing on room air. MRI left foot showed large plantar foot wound with possible septic arthritis, abscess and worsening osteomyelitis.   EDP discussed with orthopedics Dr. Victorino Dike  Admitted to [facility] with tentative plan of amputation.  Subjective: Patient was seen and examined this morning.   Wife at bedside.  Patient complains of several episodes of pain last night.  'Pain medicine does not last long.' Blood pressure remains elevated persistently.  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 Lajoyce Corners Wound VAC in place. Currently on IV ceftriaxone, IV Flagyl and oral Zyvox.  Can stop Zyvox as it interacts with oxycodone.  I will keep IV Rocephin and IV Flagyl for now. For pain control, I have started him on scheduled Tylenol, scheduled oxycodone and as needed Dilaudid.  Acute on chronic anemia Baseline hemoglobin between 9 and 10.  Postop drop noted. Hemoglobin this morning 8.1.  Continue to monitor.  Transfuse if less than 7. Recent  Labs    06/07/22 0335 08/13/22 1500 08/14/22 0500 08/15/22 0309 08/16/22 0215  HGB 9.6* 9.8* 8.7* 8.7* 8.1*  MCV 77.2* 74.7* 71.8* 74.6* 74.2*    Type 2 diabetes mellitus uncontrolled with hyperglycemia A1c 12.3 in January 2024.  Repeat A1c on 4/3 is better at 10.3. PTA on on Lantus 20 units daily and Humalog 15 units 3 times daily. Currently only on sliding scale insulin with Accu-Cheks.  Blood sugar level running elevated.  I started him on 10 units of Lantus this morning. Recent Labs  Lab 08/15/22 1554 08/15/22 1653 08/15/22 2214 08/16/22 0814 08/16/22 1133  GLUCAP 146* 165* 224* 153* 173*   Essential hypertension Chronic bilateral lower extremity edema PTA not compliant to meds.   Blood pressure running elevated.  Partly also because of inadequate pain control. Started on losartan 50 mg daily and Lasix 40 mg daily this morning.  Continue monitor blood pressure.  Hypokalemia Potassium low at 3.4.  Oral replacement ordered. Recent Labs  Lab 08/13/22 1500 08/14/22 0500 08/15/22 0309 08/16/22 0215  K 3.2* 3.1* 3.5 3.4*    Morbid Obesity  Body mass index is 45.52 kg/m. Patient has been advised to make an attempt to improve diet and exercise patterns to aid in weight loss.  Mobility: Needs PT eval postprocedure  Goals of care   Code Status: Full Code     DVT prophylaxis: Start Lovenox today.  SCD's Start: 08/15/22 1604   Antimicrobials: IV Rocephin, IV Flagyl  Fluid: Currently on NS at 75 mill per hour.  Stop IV fluid today Consultants: Orthopedics  Family Communication: Wife at bedside  Status: Inpatient Level of care:  Telemetry Medical   Needs to continue in-hospital care:  Pending PT eval.  Left BKA done  Patient from: Home Anticipated d/c to: Pending clinical course      Diet:  Diet Order             Diet Carb Modified Fluid consistency: Thin; Room service appropriate? Yes  Diet effective now                   Scheduled Meds:   acetaminophen  1,000 mg Oral TID   vitamin C  1,000 mg Oral Daily   docusate sodium  100 mg Oral Daily   furosemide  40 mg Oral Daily   insulin aspart  0-20 Units Subcutaneous TID WC   insulin aspart  0-5 Units Subcutaneous QHS   insulin glargine-yfgn  10 Units Subcutaneous Daily   losartan  50 mg Oral Daily   multivitamin with minerals  1 tablet Oral Daily   oxyCODONE  5 mg Oral Q6H   pantoprazole  40 mg Oral Daily   potassium chloride  40 mEq Oral Once   zinc sulfate  220 mg Oral Daily    PRN meds: alum & mag hydroxide-simeth, bisacodyl, guaiFENesin-dextromethorphan, hydrALAZINE, hydrALAZINE, hydrocortisone cream, HYDROmorphone (DILAUDID) injection, labetalol, LORazepam, LORazepam, magnesium citrate, magnesium sulfate bolus IVPB, metoprolol tartrate, ondansetron **OR** ondansetron (ZOFRAN) IV, phenol, polyethylene glycol   Infusions:   sodium chloride 75 mL/hr at 08/15/22 1305   sodium chloride 75 mL/hr at 08/15/22 1944   cefTRIAXone (ROCEPHIN)  IV Stopped (08/15/22 2149)   magnesium sulfate bolus IVPB     metronidazole 500 mg (08/16/22 0454)    Antimicrobials: Anti-infectives (From admission, onward)    Start     Dose/Rate Route Frequency Ordered Stop   08/15/22 1225  ceFAZolin (ANCEF) 2-4 GM/100ML-% IVPB  Status:  Discontinued       Note to Pharmacy: Gleason, Ginger E: cabinet override      08/15/22 1225 08/15/22 1246   08/15/22 1156  ceFAZolin (ANCEF) 3-0.9 GM/100ML-% IVPB       Note to Pharmacy: Phebe CollaHOMPSON, LUISA N: cabinet override      08/15/22 1156 08/15/22 2359   08/14/22 1300  ceFAZolin (ANCEF) IVPB 3g/100 mL premix  Status:  Discontinued        3 g 200 mL/hr over 30 Minutes Intravenous On call to O.R. 08/14/22 1128 08/15/22 0559   08/14/22 0215  linezolid (ZYVOX) IVPB 600 mg  Status:  Discontinued       Note to Pharmacy: Patient not tolerating vanc IV even at lower rate, changing to zyvox. Patient denies history of past zyvox reaction. First vanc dose was not fully  given.   600 mg 300 mL/hr over 60 Minutes Intravenous Every 12 hours 08/14/22 0124 08/16/22 1120   08/14/22 0200  vancomycin (VANCOREADY) IVPB 2000 mg/400 mL  Status:  Discontinued        2,000 mg 100 mL/hr over 240 Minutes Intravenous Every 12 hours 08/13/22 1750 08/14/22 0225   08/13/22 2200  cefTRIAXone (ROCEPHIN) 2 g in sodium chloride 0.9 % 100 mL IVPB        2 g 200 mL/hr over 30 Minutes Intravenous Every 24 hours 08/13/22 1646 08/20/22 2159   08/13/22 1345  vancomycin (VANCOREADY) IVPB 2000 mg/400 mL        2,000 mg 200 mL/hr over 120 Minutes Intravenous  Once 08/13/22 1311 08/13/22 1626   08/13/22 1315  metroNIDAZOLE (  FLAGYL) IVPB 500 mg        500 mg 100 mL/hr over 60 Minutes Intravenous Every 12 hours 08/13/22 1307 08/20/22 1559   08/13/22 1315  ceFEPIme (MAXIPIME) 2 g in sodium chloride 0.9 % 100 mL IVPB        2 g 200 mL/hr over 30 Minutes Intravenous  Once 08/13/22 1311 08/13/22 1406       Nutritional status:  Body mass index is 45.52 kg/m.  Nutrition Problem: Increased nutrient needs Etiology: wound healing, post-op healing Signs/Symptoms: estimated needs     Objective: Vitals:   08/15/22 2201 08/16/22 0912  BP: (!) 181/97 (!) 186/99  Pulse:  86  Resp:  18  Temp:    SpO2:  100%    Intake/Output Summary (Last 24 hours) at 08/16/2022 1150 Last data filed at 08/16/2022 0849 Gross per 24 hour  Intake 2000.08 ml  Output 750 ml  Net 1250.08 ml   Filed Weights   08/13/22 1206 08/15/22 1210  Weight: (!) 169.6 kg (!) 169.6 kg   Weight change:  Body mass index is 45.52 kg/m.   Physical Exam: General exam: Pleasant, middle-aged African-American male. Skin: No rashes, lesions or ulcers. HEENT: Atraumatic, normocephalic, no obvious bleeding Lungs: Clear to auscultation bilaterally CVS: Regular rate and rhythm, no murmur GI/Abd soft, nontender, nondistended, bowel sound present CNS: Alert, awake, oriented x 3 Psychiatry: Mood appropriate Extremities:  Chronic bilateral lower extremity edema left more than right.  Left lower extremity BKA status.  On boot.  Data Review: I have personally reviewed the laboratory data and studies available.  F/u labs ordered Unresulted Labs (From admission, onward)     Start     Ordered   08/20/22 0500  Creatinine, serum  (enoxaparin (LOVENOX)    CrCl >/= 30 ml/min)  Weekly,   R     Comments: while on enoxaparin therapy    08/13/22 1846   08/15/22 0500  CBC with Differential/Platelet  Daily,   R      08/14/22 1133   08/15/22 0500  Basic metabolic panel  Daily,   R      08/14/22 1133            Total time spent in review of labs and imaging, patient evaluation, formulation of plan, documentation and communication with family: 45 minutes  Signed, Lorin Glass, MD [facility] 08/16/2022

## 2022-08-16 NOTE — Progress Notes (Signed)
Patient ID: Alan Tapia., male   DOB: 1975-10-29, 47 y.o.   MRN: [id number] Patient is postoperative day 1 left transtibial amputation.  There is 50 cc in the wound VAC canister with a good suction fit.  Anticipate patient would be a good candidate for inpatient rehab.

## 2022-08-16 NOTE — Evaluation (Signed)
Occupational Therapy Evaluation Patient Details Name: Alan Ginsbergdward Kittler Jr. MRN: [id number] DOB: 02/19/1976 Today's Date: 08/16/2022   History of Present Illness Pt is a 47 y.o. male admitted 4/3 with 6 mo history of L foot ulcer; Presenting with swelling and progressive yellow discharge from the foot. 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 PMH significant for DMII, HTN.   Clinical Impression   PTA, pt lived with his family; wife recently assisting with LB ADL, IADL, and bathing. Upon eval, pt performing UB ADL with set-up and LB ADL with max-total A +2. Pt educated regarding limb protector and desensitization techniques. Pt demonstrates decreased balance, strength, safety, endurance, power, and knowledge of precautions. Pt wife present and supportive; able to provide 24/7 support at discharge. Pt unable to fit wheelchair into bathroom in his home so will need to be more mobile prior to return home. Highly recommending collaborative, multidisciplinary therapies >3hrs a day after acute stay.      Recommendations for follow up therapy are one component of a multi-disciplinary discharge planning process, led by the attending physician.  Recommendations may be updated based on patient status, additional functional criteria and insurance authorization.   Assistance Recommended at Discharge Frequent or constant Supervision/Assistance  Patient can return home with the following Two people to help with walking and/or transfers;Two people to help with bathing/dressing/bathroom;Assistance with cooking/housework;Assist for transportation;Help with stairs or ramp for entrance    Functional Status Assessment  Patient has had a recent decline in their functional status and demonstrates the ability to make significant improvements in function in a reasonable and predictable amount of time.  Equipment Recommendations  Other (comment) (defer)    Recommendations for Other Services Rehab consult     Precautions / Restrictions  Precautions Precautions: Fall;Knee Precaution Booklet Issued: Yes (comment) (Reviewed BKA precautions) Required Braces or Orthoses: Other Brace (amputation splint) Restrictions Weight Bearing Restrictions: Yes LLE Weight Bearing: Non weight bearing      Mobility Bed Mobility Overal bed mobility: Needs Assistance Bed Mobility: Supine to Sit, Sit to Supine     Supine to sit: Min assist, HOB elevated Sit to supine: Min assist   General bed mobility comments: Min assist for LLE support only, cues for technique, good movement with upper body and RLE, effortful. Cues for technique.    Transfers Overall transfer level: Needs assistance Equipment used: Rolling walker (2 wheels) Transfers: Sit to/from Stand, Bed to chair/wheelchair/BSC Sit to Stand: Mod assist, +2 physical assistance, From elevated surface          Lateral/Scoot Transfers: Min assist, +2 safety/equipment, With slide board General transfer comment: Mod assist for boost to stand +2 for balance and safety. Cues for technique and hand placement. Good power up from elevated bed surface x2 but unsteady despite use of RW. Second attempt with better control, less anxious. Tolerated gentle Rt knee bends and weight shift. Small pivot Rt and Lt on Rt foot, unable to lift heel though. Progressed lateral scoot, towards right from elevated surface from bed to drop arm recliner, then with sliding board due to difficulty going towards left, much better with min assist +2 for safety to scoot back to bed.      Balance Overall balance assessment: Needs assistance Sitting-balance support: No upper extremity supported, Feet supported Sitting balance-Leahy Scale: Fair     Standing balance support: Bilateral upper extremity supported, Reliant on assistive device for balance Standing balance-Leahy Scale: Zero Standing balance comment: Initially +2 mod assist for balance, however progressed to min assist+2 for balance  at EOB. with walker.                            ADL either performed or assessed with clinical judgement   ADL Overall ADL's : Needs assistance/impaired Eating/Feeding: Modified independent;Sitting   Grooming: Set up;Sitting   Upper Body Bathing: Set up;Sitting   Lower Body Bathing: Maximal assistance;Sitting/lateral leans   Upper Body Dressing : Set up;Sitting   Lower Body Dressing: Total assistance;+2 for physical assistance;Sit to/from stand   Toilet Transfer: Moderate assistance;+2 for physical assistance;+2 for safety/equipment;Rolling walker (2 wheels) Toilet Transfer Details (indicate cue type and reason): STS only         Functional mobility during ADLs: Moderate assistance;+2 for physical assistance;+2 for safety/equipment General ADL Comments: Limited by balance, pain, decr strength     Vision Baseline Vision/History: 1 Wears glasses Ability to See in Adequate Light: 0 Adequate Patient Visual Report: No change from baseline Vision Assessment?: No apparent visual deficits     Perception Perception Perception Tested?: No   Praxis      Pertinent Vitals/Pain Pain Assessment Pain Assessment: 0-10 Pain Score: 10-Worst pain ever Pain Location: Lt residual limb surgical site Pain Descriptors / Indicators: Aching, Constant (+ phantom pains) Pain Intervention(s): Limited activity within patient's tolerance, Monitored during session, Repositioned, Premedicated before session     Hand Dominance Right   Extremity/Trunk Assessment Upper Extremity Assessment Upper Extremity Assessment: Generalized weakness (of BUE to support bodyweight through arms)   Lower Extremity Assessment Lower Extremity Assessment: Defer to PT evaluation LLE Deficits / Details: Wearing amputee splint LLE: Unable to fully assess due to pain;Unable to fully assess due to immobilization       Communication Communication Communication: No difficulties   Cognition Arousal/Alertness:  Awake/alert Behavior During Therapy: WFL for tasks assessed/performed Overall Cognitive Status: Within Functional Limits for tasks assessed                                 General Comments: some increased time for processing     General Comments  Reviewed post-op precautions as well as basic desensitization strategies.    Exercises Exercises: Amputee Amputee Exercises Quad Sets: AROM, Both, 10 reps, Seated Gluteal Sets: Strengthening, Both, 10 reps, Seated Hip Extension: AROM, Left, 5 reps, Standing   Shoulder Instructions      Home Living Family/patient expects to be discharged to:: Private residence Living Arrangements: Spouse/significant other;Children (three children 1-16 y.o) Available Help at Discharge: Family;Available 24 hours/day Type of Home: House (townhome) Home Access: Stairs to enter [employer] of Steps: 2 Entrance Stairs-Rails: Right;Left Home Layout: Two level;Bed/bath upstairs Alternate Level Stairs-Number of Steps: 13 Alternate Level Stairs-Rails: Left Bathroom Shower/Tub: Chief Strategy Officer: Standard Bathroom Accessibility: No   Home Equipment: Cane - quad          Prior Functioning/Environment Prior Level of Function : Needs assist             Mobility Comments: Using quad cane for ambulation, no falls. ADLs Comments: Needed help from wife extensively, assisted with dressing, wound dressing, manages finances. He was able to drive. Previously taught middle school special needs        OT Problem List: Decreased strength;Decreased activity tolerance;Impaired balance (sitting and/or standing);Decreased safety awareness;Decreased knowledge of precautions;Decreased knowledge of use of DME or AE;Pain      OT Treatment/Interventions: Therapeutic exercise;Self-care/ADL training;DME and/or AE  instruction;Balance training;Patient/family education;Therapeutic activities    OT Goals(Current goals can be found  in the care plan section) Acute Rehab OT Goals Patient Stated Goal: get better OT Goal Formulation: With patient Time For Goal Achievement: 08/30/22 Potential to Achieve Goals: Good ADL Goals Pt Will Perform Lower Body Dressing: with min guard assist;sit to/from stand Pt Will Transfer to Toilet: with supervision;with transfer board;squat pivot transfer Additional ADL Goal #1: Pt will verbalize desensitization techniques for LLE  OT Frequency: Min 2X/week    Co-evaluation PT/OT/SLP Co-Evaluation/Treatment: Yes Reason for Co-Treatment: Complexity of the patient's impairments (multi-system involvement);For patient/therapist safety;To address functional/ADL transfers PT goals addressed during session: Mobility/safety with mobility OT goals addressed during session: ADL's and self-care;Proper use of Adaptive equipment and DME      AM-PAC OT "6 Clicks" Daily Activity     Outcome Measure Help from another person eating meals?: None Help from another person taking care of personal grooming?: A Little Help from another person toileting, which includes using toliet, bedpan, or urinal?: A Lot Help from another person bathing (including washing, rinsing, drying)?: A Lot Help from another person to put on and taking off regular upper body clothing?: A Little Help from another person to put on and taking off regular lower body clothing?: Total 6 Click Score: 15   End of Session Equipment Utilized During Treatment: Gait belt;Rolling walker (2 wheels);Other (comment) (limb protector) Nurse Communication: Mobility status  Activity Tolerance: Patient tolerated treatment well Patient left: in bed;with call bell/phone within reach;with bed alarm set;with family/visitor present  OT Visit Diagnosis: Unsteadiness on feet (R26.81);Other abnormalities of gait and mobility (R26.89);Muscle weakness (generalized) (M62.81);Pain Pain - Right/Left: Left Pain - part of body: Leg                Time:  4332-9518 OT Time Calculation (min): 48 min Charges:  OT General Charges $OT Visit: 1 Visit OT Evaluation $OT Eval Low Complexity: 1 Low  Tyler Deis, OTR/L [facility] Acute Rehabilitation Office: [phone]   Myrla Halsted 08/16/2022, 3:37 PM

## 2022-08-16 NOTE — Evaluation (Signed)
Physical Therapy Evaluation Patient Details Name: Alan Tapia. MRN: [id number] DOB: Aug 24, 1975 Today's Date: 08/16/2022  History of Present Illness  Pt is a 47 y.o. male admitted 4/3 with 6 mo history of L foot ulcer; Presenting with swelling and progressive yellow discharge from the foot. 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 PMH significant for DMII, HTN.  Clinical Impression  Patient is s/p above surgery resulting in functional limitations due to the deficits listed below (see PT Problem List). Previously independent, works as a Runner, broadcasting/film/video, drives. Recently requiring assistance from wife due to infection, assisting for LB dressing, wound care/dressing changes. Uses quad cane for ambulation PTA. Presently, pt required mod assist +2 for sit<>stand transfers. Min assist +2 for sliding board transfers out of bed. Pt motivated and eager to work with rehab team. Patient will benefit from acute skilled PT to increase their independence and safety with mobility to facilitate discharge.  Patient will benefit from intensive inpatient follow up therapy, >3 hours/day        Recommendations for follow up therapy are one component of a multi-disciplinary discharge planning process, led by the attending physician.  Recommendations may be updated based on patient status, additional functional criteria and insurance authorization.  Follow Up Recommendations Can patient physically be transported by private vehicle: No     Assistance Recommended at Discharge Frequent or constant Supervision/Assistance  Patient can return home with the following  Two people to help with walking and/or transfers;Two people to help with bathing/dressing/bathroom;Assistance with cooking/housework;Assist for transportation;Help with stairs or ramp for entrance    Equipment Recommendations  (TBD next venue of care - acutely would benefit from bariatric rolling walker.)  Recommendations for Other Services  Rehab consult    Functional Status  Assessment Patient has had a recent decline in their functional status and demonstrates the ability to make significant improvements in function in a reasonable and predictable amount of time.     Precautions / Restrictions Precautions Precautions: Fall;Knee Precaution Booklet Issued: Yes (comment) (Reviewed BKA prec) Required Braces or Orthoses: Sling (Amputation splint) Restrictions Weight Bearing Restrictions: Yes LLE Weight Bearing: Non weight bearing      Mobility  Bed Mobility Overal bed mobility: Needs Assistance Bed Mobility: Supine to Sit, Sit to Supine     Supine to sit: Min assist, HOB elevated Sit to supine: Min assist   General bed mobility comments: Min assist for LLE support only, cues for technique, good movement with upper body and RLE, effortful. Cues for technique.    Transfers Overall transfer level: Needs assistance Equipment used: Rolling walker (2 wheels) Transfers: Sit to/from Stand, Bed to chair/wheelchair/BSC Sit to Stand: Mod assist, +2 physical assistance, From elevated surface          Lateral/Scoot Transfers: Min assist, +2 safety/equipment, With slide board General transfer comment: Mod assist for boost to stand +2 for balance and safety. Cues for technique and hand placement. Good power up from elevated bed surface x2 but unsteady despite use of RW. Second attempt with better control, less anxious. Tolerated gentle Rt knee bends and weight shift. Small pivot Rt and Lt on Rt foot, unable to lift heel though. Progressed lateral scoot, towards right from elevated surface from bed to drop arm recliner, then with sliding board due to difficulty going towards left, much better with min assist +2 for safety to scoot back to bed.    Ambulation/Gait                  Stairs  Wheelchair Mobility    Modified Rankin (Stroke Patients Only)       Balance Overall balance assessment: Needs assistance Sitting-balance support: No  upper extremity supported, Feet supported Sitting balance-Leahy Scale: Fair     Standing balance support: Bilateral upper extremity supported, Reliant on assistive device for balance Standing balance-Leahy Scale: Zero Standing balance comment: Initially +2 mod assist for balance, however progressed to min assist+2 for balance at EOB. with walker.                             Pertinent Vitals/Pain Pain Assessment Pain Assessment: 0-10 Pain Score: 10-Worst pain ever Pain Location: Lt residual limb surgical site Pain Descriptors / Indicators: Aching, Constant (+ phantom pains) Pain Intervention(s): Limited activity within patient's tolerance, Monitored during session, Premedicated before session, Repositioned    Home Living Family/patient expects to be discharged to:: Private residence Living Arrangements: Spouse/significant other;Children (3 children 541-16 yo.) Available Help at Discharge: Family;Available 24 hours/day Type of Home: House (townhome) Home Access: Stairs to enter Entrance Stairs-Rails: Doctor, general practiceight;Left Entrance Stairs-Number of Steps: 2 Alternate Level Stairs-Number of Steps: 13 Home Layout: Two level;Bed/bath upstairs Home Equipment: Cane - quad      Prior Function Prior Level of Function : Needs assist             Mobility Comments: Using quad cane for ambulation, no falls. ADLs Comments: Needed help from wife extensively, assisted with dressing, wound dressing, manages finances. He was able to drive.     Hand Dominance   Dominant Hand: Right    Extremity/Trunk Assessment   Upper Extremity Assessment Upper Extremity Assessment: Defer to OT evaluation    Lower Extremity Assessment LLE Deficits / Details: Wearing amputee splint LLE: Unable to fully assess due to pain;Unable to fully assess due to immobilization       Communication   Communication: No difficulties  Cognition Arousal/Alertness: Awake/alert Behavior During Therapy: WFL for  tasks assessed/performed Overall Cognitive Status: Within Functional Limits for tasks assessed                                          General Comments General comments (skin integrity, edema, etc.): Reviewed post-op precautions, handout provided    Exercises Amputee Exercises Quad Sets: AROM, Both, 10 reps, Seated Gluteal Sets: Strengthening, Both, 10 reps, Seated Hip Extension: AROM, Left, 5 reps, Standing   Assessment/Plan    PT Assessment Patient needs continued PT services  PT Problem List Decreased strength;Decreased range of motion;Decreased activity tolerance;Decreased balance;Decreased mobility;Decreased knowledge of use of DME;Decreased knowledge of precautions;Obesity;Pain       PT Treatment Interventions DME instruction;Gait training;Functional mobility training;Therapeutic activities;Stair training;Therapeutic exercise;Balance training;Neuromuscular re-education;Patient/family education;Modalities;Wheelchair mobility training    PT Goals (Current goals can be found in the Care Plan section)  Acute Rehab PT Goals Patient Stated Goal: get well PT Goal Formulation: With patient/family Time For Goal Achievement: 08/30/22 Potential to Achieve Goals: Good    Frequency Min 4X/week     Co-evaluation               AM-PAC PT "6 Clicks" Mobility  Outcome Measure Help needed turning from your back to your side while in a flat bed without using bedrails?: A Little Help needed moving from lying on your back to sitting on the side of a flat bed without using bedrails?: A  Little Help needed moving to and from a bed to a chair (including a wheelchair)?: Total Help needed standing up from a chair using your arms (e.g., wheelchair or bedside chair)?: Total Help needed to walk in hospital room?: Total Help needed climbing 3-5 steps with a railing? : Total 6 Click Score: 10    End of Session Equipment Utilized During Treatment: Gait belt (amputee  splint) Activity Tolerance: Patient tolerated treatment well Patient left: in bed;with call bell/phone within reach;with bed alarm set;with family/visitor present Nurse Communication: Mobility status;Precautions (Sliding board use, in gym) PT Visit Diagnosis: Unsteadiness on feet (R26.81);Muscle weakness (generalized) (M62.81);Difficulty in walking, not elsewhere classified (R26.2);Pain Pain - Right/Left: Left Pain - part of body: Leg    Time: 7846-9629 PT Time Calculation (min) (ACUTE ONLY): 47 min   Charges:   PT Evaluation $PT Eval Low Complexity: 1 Low PT Treatments $Therapeutic Activity: 8-22 mins        Kathlyn Sacramento, PT, DPT Physical Therapist Acute Rehabilitation Services [facility] Outpatient Rehabilitation Services [facility]   Berton Mount 08/16/2022, 2:13 PM

## 2022-08-16 NOTE — Progress Notes (Signed)
Brief Nutrition Note  RD consulted for diet education. This RD saw pt for full Initial Nutrition Assessment on 08/14/22. At that time, diet education regarding wound healing and DM management was provided. Pt was given multiple handouts from the [employer]. Pt amenable to taking MVI with minerals and receiving double protein portions at that time.  Spoke with pt's wife via phone call to room. Pt reports not feeling well after receiving multiple medications and asking RD to call back later. [name] shares that pt is taking the MVI with minerals as well as the vitamin C and zinc sulfate that were ordered by MD. Pt does not like the Juven and does not want to receive it; RD will d/c this supplement.  RD called back later in the day to attempt to speak with pt x 2. Phone line was busy on both attempts. Pt with handouts already provided. Will check with pt to see if he has questions regarding diet at follow-up.  RD will continue to follow during admission.   Mertie Clause, MS, RD, LDN Inpatient Clinical Dietitian Please see [facility] for contact information.

## 2022-08-16 NOTE — Progress Notes (Signed)
ANTICOAGULATION CONSULT NOTE - Initial Consult  Pharmacy Consult for Lovenox Indication: VTE prophylaxis  Allergies  Allergen Reactions   Fish Allergy Anaphylaxis, Hives and Other (See Comments)   Benadryl [Diphenhydramine] Swelling    Pt reports cannot take pill form of benadryl   Vancomycin     Redman's-type rash on 08/13/22    Patient Measurements: Height: 6\' 4"  (193 cm) Weight: (!) 169.6 kg (374 lb) IBW/kg (Calculated) : 86.8 Lovenox Dosing Weight: 169.6 kg  Vital Signs: BP: 186/99 (04/06 0912) Pulse Rate: 86 (04/06 0912)  Labs: Recent Labs    08/14/22 0500 08/15/22 0309 08/16/22 0215  HGB 8.7* 8.7* 8.1*  HCT 26.8* 29.1* 26.4*  PLT 367 388 367  CREATININE 1.16 1.18 1.11    Estimated Creatinine Clearance: 139.5 mL/min (by C-G formula based on SCr of 1.11 mg/dL).   Medical History: Past Medical History:  Diagnosis Date   Hypertension    Uncontrolled type 2 diabetes mellitus with hyperglycemia, with long-term current use of insulin 06/06/2022   Assessment:  47 yr old male s/p left BKA on 08/15/22. To begin Lovenox for VTE prophylaxis.  Hemoglobin 9.8 on admit, 8.7 pre-op and 8.1 today. Platelet count normal.  EBL 200 ml.  AET 1411.  BMI 45.  Will dose Lovenox ~0.5 mg/kg.  Goal of Therapy:  Appropriate Lovenox regimen for indication Monitor platelets by anticoagulation protocol: Yes   Plan:  Will begin Lovenox 80 mg SQ q24h tonight. Intermittent CBC; monitor for bleeding.   Dennie Fetters, RPh 08/16/2022,12:10 PM

## 2022-08-17 ENCOUNTER — Inpatient Hospital Stay (HOSPITAL_COMMUNITY): Payer: Medicaid - Out of State

## 2022-08-17 DIAGNOSIS — M86272 Subacute osteomyelitis, left ankle and foot: Secondary | ICD-10-CM | POA: Diagnosis not present

## 2022-08-17 DIAGNOSIS — R9431 Abnormal electrocardiogram [ECG] [EKG]: Secondary | ICD-10-CM

## 2022-08-17 LAB — CBC WITH DIFFERENTIAL/PLATELET
Abs Immature Granulocytes: 0.04 10*3/uL (ref 0.00–0.07)
Basophils Absolute: 0.1 10*3/uL (ref 0.0–0.1)
Basophils Relative: 1 %
Eosinophils Absolute: 0.1 10*3/uL (ref 0.0–0.5)
Eosinophils Relative: 1 %
HCT: 25.8 % — ABNORMAL LOW (ref 39.0–52.0)
Hemoglobin: 8 g/dL — ABNORMAL LOW (ref 13.0–17.0)
Immature Granulocytes: 1 %
Lymphocytes Relative: 19 %
Lymphs Abs: 1.5 10*3/uL (ref 0.7–4.0)
MCH: 22.9 pg — ABNORMAL LOW (ref 26.0–34.0)
MCHC: 31 g/dL (ref 30.0–36.0)
MCV: 73.9 fL — ABNORMAL LOW (ref 80.0–100.0)
Monocytes Absolute: 0.6 10*3/uL (ref 0.1–1.0)
Monocytes Relative: 8 %
Neutro Abs: 5.7 10*3/uL (ref 1.7–7.7)
Neutrophils Relative %: 70 %
Platelets: 397 10*3/uL (ref 150–400)
RBC: 3.49 MIL/uL — ABNORMAL LOW (ref 4.22–5.81)
RDW: 15.6 % — ABNORMAL HIGH (ref 11.5–15.5)
WBC: 8.1 10*3/uL (ref 4.0–10.5)
nRBC: 0 % (ref 0.0–0.2)

## 2022-08-17 LAB — ECHOCARDIOGRAM COMPLETE
Area-P 1/2: 3.34 cm2
Calc EF: 36 %
Height: 76 in
S' Lateral: 4.7 cm
Single Plane A2C EF: 36.7 %
Single Plane A4C EF: 37.4 %
Weight: 5984 oz

## 2022-08-17 LAB — BASIC METABOLIC PANEL
Anion gap: 8 (ref 5–15)
BUN: 6 mg/dL (ref 6–20)
CO2: 25 mmol/L (ref 22–32)
Calcium: 7.6 mg/dL — ABNORMAL LOW (ref 8.9–10.3)
Chloride: 103 mmol/L (ref 98–111)
Creatinine, Ser: 1.14 mg/dL (ref 0.61–1.24)
GFR, Estimated: >60 mL/min (ref >60)
Glucose, Bld: 142 mg/dL — ABNORMAL HIGH (ref 70–99)
Potassium: 3.6 mmol/L (ref 3.5–5.1)
Sodium: 136 mmol/L (ref 135–145)

## 2022-08-17 LAB — GLUCOSE, CAPILLARY
Glucose-Capillary: 98 mg/dL (ref 70–99)
Glucose-Capillary: 135 mg/dL — ABNORMAL HIGH (ref 70–99)
Glucose-Capillary: 138 mg/dL — ABNORMAL HIGH (ref 70–99)
Glucose-Capillary: 120 mg/dL — ABNORMAL HIGH (ref 70–99)

## 2022-08-17 MED ORDER — IBUPROFEN 200 MG PO TABS
800.0000 mg | ORAL_TABLET | Freq: Three times a day (TID) | ORAL | Status: AC
  Administered 2022-08-17 – 2022-08-19 (×8): 800 mg via ORAL
  Filled 2022-08-17 (×9): qty 4

## 2022-08-17 MED ORDER — CARVEDILOL 3.125 MG PO TABS
3.1250 mg | ORAL_TABLET | Freq: Two times a day (BID) | ORAL | Status: DC
  Administered 2022-08-17 – 2022-08-18 (×2): 3.125 mg via ORAL
  Filled 2022-08-17 (×2): qty 1

## 2022-08-17 NOTE — Progress Notes (Signed)
Inpatient Rehab Admissions:  Inpatient Rehab Consult received.  I met with patient and his wife at the bedside for rehabilitation assessment and to discuss goals and expectations of an inpatient rehab admission.  Discussed average length of stay, insurance authorization requirement, discharge home after completion of CIR. Both acknowledged understanding. Pt interested in pursing CIR and wife supportive. Wife confirmed that she will be able to provide 24/7 support for pt after discharge. Will continue to follow.  Signed: Wolfgang Phoenix, MS, CCC-SLP Admissions Coordinator [phone]

## 2022-08-17 NOTE — Progress Notes (Signed)
Orthopedic Tech Progress Note Patient Details:  Alan Tapia [birth year] [id number]  Patient ID: [name], male   DOB: 1975-05-20, 47 y.o.   MRN: [id number] Nursing called for ortho tech to adjust and re-apply ampushield to the LLE as it feel off during transfer earlier.  Therapist adjusted an re-tightened.  Velna Hedgecock OTR/L 08/17/2022, 5:47 PM

## 2022-08-17 NOTE — Progress Notes (Signed)
Echocardiogram 2D Echocardiogram has been performed.  Warren Lacy Zameer Borman RDCS 08/17/2022, 10:59 AM

## 2022-08-17 NOTE — PMR Pre-admission (Signed)
PMR Admission Coordinator Pre-Admission Assessment  Patient: Alan Ginsbergdward Swanton Jr. is an 47 y.o., male MRN: [id number] DOB: [birth year] Height: 6\' 4"  (193 cm) Weight: (!) 169.6 kg  Insurance Information HMO:     PPO:      PCP:      IPA:      80/20:      OTHER:  PRIMARY: [employer] Medicaid [place]      Policy#: [id number]      Subscriber: patient CM Name: Darl PikesSusan      Phone#: [phone]     Fax#: [phone] Pre-Cert#: [id number] approved for 7 days with update due 4/19 to Karlene LinemanMerina Daniel phone [phone] fax [phone]. End of month Maxie BetterMerina will be on leave and 2nd CM will be Leana RoeCathy Hudson phone [phone] fax same of [phone]      Employer:  Benefits:  Phone #: [phone]     Name: 08/18/22 Eff. Date: 06/04/22-05/11/98   In network benefits only  Deduct: none      Out of Pocket Max: none      Life Max: none CIR: in network only 100%  single case agreement with [facility] made for CIR admit on 08/22/22    SNF: in network 100% Outpatient: in network 100%     Co-Pay:  Home Health: in network 100%      Co-Pay:  DME: in network 100%     Co-Pay:  Providers: in network  Tim RoodhouseLeddy, [facility] Managed Care Contract Manager has negotiated a single case agreement with [facility] for his CIR rehab admit only.  CIR Administrator, Leia AlfBeth Murray, has the contract details. CIR to give weekly updates and they will contract until end of April 2024. [place] then requests patient apply for [facility].  SECONDARY:       Policy#:      Phone#:   Artistinancial Counselor:       Phone#:   Has applied for [place] medicaid. Case worker Valarie MerinoPam Bailey phone [phone]. TOC CM spoke with Margaree MackintoshAmirah Key on 4/9 at [phone]. States [place] would have to terminate his coverage before can pursue [place] medicaid and disability.  I spoke with financial counselor for [facility] on 4/12. Requested she follow up with patient , not his wife on his [place] disability and Medicaid applications. She will have  him sign rep form so she can follow up with [facility] Case manager to assist with disability and Medicaid applications.   Wife states he has [place] Medicaid [employer] beginning 5/24 policy # [id number] phone [phone]. I spoke with wife on 4/12 and patient to inquire into the policy number is not a typical Healthy Blue number and that she can not just add him to her policy. I requested wife to follow up with her insurance broker to clarify what wife was trying to sign him up for.  The "Data Collection Information Summary" for patients in Inpatient Rehabilitation Facilities with attached "Privacy Act Statement-Health Care Records" was provided and verbally reviewed with: N/A  Emergency Contact Information Contact Information     Name Relation Home Work Mobile   Meiser,Alan Tapia   [phone]   Tapia,Alan Father [phone]        Current Medical History  Patient Admitting Diagnosis: s/p L BKA  History of Present Illness: Pt is a 47 year old male with medical hx significant TIR:[id number]:osteomyelitis, DM., HTN.  [facility] for wound evaluation. Pt reported increased pain, swelling, and yellow discharge from left foot.  Pt had not received care for 1 month after his PICC line came out. X-ray of left foot was concerning for osteomyelitis. MRI showed septic arthritis, questionable abscess and worsening osteomyelitis. Code sepsis called. Orthopedics consulted. Pt transferred to [facility] on 08/13/22. Pt underwent Left BKA and application of wound VAC by Dr. Lajoyce Corners on 08/15/22. Echo on 4/7 showed EF 35-40% with global hypokinesis.   Currently on pain control with scheduled Tylenol, scheduled Oxycodone and prn Dilaudid.   New diagnosis of systolic CHF. Has bilateral lymphedema. Cardiology consulted. Meds adjusted. Currently on Coreg, Lasix and Entresto. Plan repeat echo in 4 to 6 weeks. Continue to monitor for daily I and O, weight and BP, renal  function and electrolytes. Hgb A1c January 12.3, Repeat ob 4/3 is 10.3, DM coordinator consulted. Currently on Semglee and SSI.   Patient's medical record from [facility] has been reviewed by the rehabilitation admission coordinator and physician.  Past Medical History  Past Medical History:  Diagnosis Date   Hypertension    Uncontrolled type 2 diabetes mellitus with hyperglycemia, with long-term current use of insulin 06/06/2022   Has the patient had major surgery during 100 days prior to admission? Yes  Family History   family history includes Allergies in his father; Arthritis in his mother; Diabetes in his father; Hypertension in his father.  Current Medications  Current Facility-Administered Medications:    acetaminophen (TYLENOL) tablet 1,000 mg, 1,000 mg, Oral, TID, Dahal, Binaya, MD, 1,000 mg at 08/22/22 0817   alum & mag hydroxide-simeth (MAALOX/MYLANTA) 200-200-20 MG/5ML suspension 15-30 mL, 15-30 mL, Oral, Q2H PRN, Nadara Mustard, MD   ascorbic acid (VITAMIN C) tablet 1,000 mg, 1,000 mg, Oral, Daily, Nadara Mustard, MD, 1,000 mg at 08/22/22 0835   bisacodyl (DULCOLAX) EC tablet 5 mg, 5 mg, Oral, Daily PRN, Nadara Mustard, MD   carvedilol (COREG) tablet 12.5 mg, 12.5 mg, Oral, BID WC, Pemberton, Heather E, MD, 12.5 mg at 08/22/22 0835   enoxaparin (LOVENOX) injection 80 mg, 80 mg, Subcutaneous, Q24H, Dahal, Binaya, MD, 80 mg at 08/21/22 1717   furosemide (LASIX) tablet 40 mg, 40 mg, Oral, Daily, Pemberton, Heather E, MD, 40 mg at 08/22/22 0835   guaiFENesin-dextromethorphan (ROBITUSSIN DM) 100-10 MG/5ML syrup 15 mL, 15 mL, Oral, Q4H PRN, Nadara Mustard, MD   hydrALAZINE (APRESOLINE) injection 5 mg, 5 mg, Intravenous, Q20 Min PRN, Nadara Mustard, MD, 5 mg at 08/15/22 2201   hydrALAZINE (APRESOLINE) tablet 25 mg, 25 mg, Oral, Q6H PRN, Nadara Mustard, MD, 25 mg at 08/14/22 1600   hydrocortisone cream 1 % 1 Application, 1 Application, Topical, BID PRN, Nadara Mustard, MD    HYDROmorphone (DILAUDID) injection 1 mg, 1 mg, Intravenous, Q4H PRN, Dahal, Binaya, MD, 1 mg at 08/16/22 2152   insulin aspart (novoLOG) injection 0-5 Units, 0-5 Units, Subcutaneous, QHS, Nadara Mustard, MD, 2 Units at 08/21/22 2033   insulin aspart (novoLOG) injection 0-9 Units, 0-9 Units, Subcutaneous, TID WC, Dahal, Binaya, MD, 1 Units at 08/22/22 0819   insulin glargine-yfgn (SEMGLEE) injection 14 Units, 14 Units, Subcutaneous, Daily, Dahal, Binaya, MD, 14 Units at 08/22/22 0905   labetalol (NORMODYNE) injection 10 mg, 10 mg, Intravenous, Q10 min PRN, Nadara Mustard, MD   LORazepam (ATIVAN) tablet 0.5 mg, 0.5 mg, Oral, PRN, Nadara Mustard, MD   LORazepam (ATIVAN) tablet 1 mg, 1 mg, Oral, PRN, Nadara Mustard, MD   magnesium sulfate IVPB 2 g 50 mL, 2 g, Intravenous, Daily  PRN, Nadara Mustard, MD   metoprolol tartrate (LOPRESSOR) injection 2-5 mg, 2-5 mg, Intravenous, Q2H PRN, Nadara Mustard, MD   multivitamin with minerals tablet 1 tablet, 1 tablet, Oral, Daily, Nadara Mustard, MD, 1 tablet at 08/22/22 0835   ondansetron (ZOFRAN) tablet 4 mg, 4 mg, Oral, Q6H PRN **OR** ondansetron (ZOFRAN) injection 4 mg, 4 mg, Intravenous, Q6H PRN, Nadara Mustard, MD, 4 mg at 08/16/22 1130   oxyCODONE (Oxy IR/ROXICODONE) immediate release tablet 5 mg, 5 mg, Oral, Q6H PRN, Dahal, Binaya, MD, 5 mg at 08/21/22 2033   pantoprazole (PROTONIX) EC tablet 40 mg, 40 mg, Oral, Daily, Nadara Mustard, MD, 40 mg at 08/21/22 0937   phenol (CHLORASEPTIC) mouth spray 1 spray, 1 spray, Mouth/Throat, PRN, Nadara Mustard, MD   polyethylene glycol (MIRALAX / GLYCOLAX) packet 17 g, 17 g, Oral, Daily PRN, Nadara Mustard, MD   sacubitril-valsartan (ENTRESTO) 49-51 mg per tablet, 1 tablet, Oral, BID, Dahal, Binaya, MD   spironolactone (ALDACTONE) tablet 25 mg, 25 mg, Oral, Daily, Pemberton, Heather E, MD, 25 mg at 08/21/22 1236   zinc sulfate capsule 220 mg, 220 mg, Oral, Daily, Nadara Mustard, MD, 220 mg at 08/22/22 0836  Patients  Current Diet:  Diet Order             Diet - low sodium heart healthy           Diet Carb Modified           Diet Carb Modified Fluid consistency: Thin; Room service appropriate? Yes  Diet effective now                  Precautions / Restrictions Precautions Precautions: Fall, Knee Precaution Booklet Issued: Yes (comment) Precaution Comments: Reviewed prec Other Brace: LLE brace Restrictions Weight Bearing Restrictions: Yes LLE Weight Bearing: Non weight bearing   Has the patient had 2 or more falls or a fall with injury in the past year? No  Prior Activity Level Community (5-7x/wk): drives, gets out of house daily  Prior Functional Level Self Care: Did the patient need help bathing, dressing, using the toilet or eating? Needed some help  Indoor Mobility: Did the patient need assistance with walking from room to room (with or without device)? Independent  Stairs: Did the patient need assistance with internal or external stairs (with or without device)? Independent  Functional Cognition: Did the patient need help planning regular tasks such as shopping or remembering to take medications? Needed some help  Patient Information Are you of Hispanic, Latino/a,or Spanish origin?: A. No, not of Hispanic, Latino/a, or Spanish origin What is your race?: B. Black or African American Do you need or want an interpreter to communicate with a doctor or health care staff?: 0. No  Patient's Response To:  Health Literacy and Transportation Is the patient able to respond to health literacy and transportation needs?: Yes Health Literacy - How often do you need to have someone help you when you read instructions, pamphlets, or other written material from your doctor or pharmacy?: Never In the past 12 months, has lack of transportation kept you from medical appointments or from getting medications?: No In the past 12 months, has lack of transportation kept you from meetings, work, or from  getting things needed for daily living?: No  Home Assistive Devices / Equipment Home Assistive Devices/Equipment: Jeannette How (specify type) Home Equipment: Cane - quad  Prior Device Use: Indicate devices/aids used by the patient prior to  current illness, exacerbation or injury? Walker and cane  Current Functional Level Cognition  Overall Cognitive Status: Within Functional Limits for tasks assessed Orientation Level: Oriented X4 General Comments: Very motivated to participate in therapy. Pt believing that he should be doing more and benefits from education regarding expected progression    Extremity Assessment (includes Sensation/Coordination)  Upper Extremity Assessment: Overall WFL for tasks assessed  Lower Extremity Assessment: Defer to PT evaluation LLE Deficits / Details: Wearing amputee splint LLE: Unable to fully assess due to pain, Unable to fully assess due to immobilization    ADLs  Overall ADL's : Needs assistance/impaired Eating/Feeding: Modified independent, Sitting Grooming: Set up, Sitting Upper Body Bathing: Set up, Sitting Lower Body Bathing: Maximal assistance, Sitting/lateral leans Upper Body Dressing : Set up, Sitting Upper Body Dressing Details (indicate cue type and reason): new gown Lower Body Dressing: Supervision/safety, Sitting/lateral leans Lower Body Dressing Details (indicate cue type and reason): Reviewing techniques for LB ADL with pt. PT with greater comfort performing ADL pivoting to get RLE into bed with him rather than crossing R over L Toilet Transfer: Rolling walker (2 wheels), Moderate assistance, +2 for physical assistance, +2 for safety/equipment (stedy and RW attempts at STS see transfer comments) Toilet Transfer Details (indicate cue type and reason): STS Functional mobility during ADLs: Moderate assistance, Minimal assistance, +2 for physical assistance, +2 for safety/equipment, Rolling walker (2 wheels) (stedy) General ADL  Comments: Limited by balance, pain, decr strength    Mobility  Overal bed mobility: Needs Assistance Bed Mobility: Sit to Supine Supine to sit: Supervision Sit to supine: Supervision General bed mobility comments: Pt able to manage BLE elevation to EOB and reposition in bed with supervision.    Transfers  Overall transfer level: Needs assistance Equipment used: Rolling walker (2 wheels), Ambulation equipment used Transfers: Sit to/from Stand Sit to Stand: Min assist, +2 physical assistance, +2 safety/equipment, From elevated surface, Mod assist Bed to/from chair/wheelchair/BSC transfer type:: Squat pivot Squat pivot transfers: Min assist  Lateral/Scoot Transfers: Supervision General transfer comment: Heavy focus on STS transfers this session. Initially performing with stedy to optimize anterior weight shift. Performing 4 mini squats with bringing bottom off of bed with stedy. Then transitioning to full stand 2x with stedy with min A for boost and mod A for stedy with +2 A at all times for safety. Pt with good safety awareness and tolerance of standing, so transitioned to RW and pt continues to require min A +2 for rise and mod A +2 to stedy for 3 additional attempts    Ambulation / Gait / Stairs / Wheelchair Mobility  Ambulation/Gait Pre-gait activities: Pivoting R foot laterally (heel-toe) in standing with RW support    Posture / Balance Dynamic Sitting Balance Sitting balance - Comments: sitting EOB Balance Overall balance assessment: Needs assistance Sitting-balance support: No upper extremity supported, Feet supported Sitting balance-Leahy Scale: Good Sitting balance - Comments: sitting EOB Standing balance support: Bilateral upper extremity supported, Reliant on assistive device for balance Standing balance-Leahy Scale: Poor Standing balance comment: standing up to 30 seconds with min guard A with BUE supported on RW. Pt attempting to pivot on RLE with RW support and able to  pivot ~2 in.    Special needs/care consideration Wound VAC   Previous Home Environment  Living Arrangements: Tapia/significant other, Children (three children 1-16 y.o)  Lives With: Significant other Available Help at Discharge: Family, Available 24 hours/day Type of Home: Other(Comment) (townhouse) Home Layout: Two level, 1/2 bath on main level Alternate  Level Stairs-Rails: Left Alternate Level Stairs-Number of Steps: 13 Home Access: Stairs to enter Entrance Stairs-Rails: Right, Left Entrance Stairs-Number of Steps: 2 Bathroom Shower/Tub: Engineer, manufacturing systemsTub/shower unit Bathroom Toilet: Standard Bathroom Accessibility: Yes How Accessible: Accessible via walker Home Care Services: No  Discharge Living Setting Plans for Discharge Living Setting: Patient's home Type of Home at Discharge: Other (Comment) (townhouse) Discharge Home Layout: Two level, 1/2 bath on main level Alternate Level Stairs-Rails: Left Alternate Level Stairs-Number of Steps: 13 Discharge Home Access: Stairs to enter Entrance Stairs-Rails: Right, Left Entrance Stairs-Number of Steps: 2 Discharge Bathroom Shower/Tub: Tub/shower unit Discharge Bathroom Toilet: Standard Discharge Bathroom Accessibility: Yes How Accessible: Accessible via walker Does the patient have any problems obtaining your medications?: No  Social/Family/Support Systems Anticipated Caregiver: Toney SangSamantha Rasberry, wife Anticipated Caregiver's Contact Information: [phone] Caregiver Availability: 24/7 Discharge Plan Discussed with Primary Caregiver: Yes Is Caregiver In Agreement with Plan?: Yes Does Caregiver/Family have Issues with Lodging/Transportation while Pt is in Rehab?: No  Was living separate form wife as her was 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 Now to live with [place] in [place]. She has contacted Housing authority to add him to their lease and to request handicapped housing.  Goals Patient/Family Goal for Rehab:  Supervision-Min A:PT/OT Expected length of stay: 7- 10 days Pt/Family Agrees to Admission and willing to participate: Yes Program Orientation Provided & Reviewed with Pt/Caregiver Including Roles  & Responsibilities: Yes  Decrease burden of Care through IP rehab admission: NA  Possible need for SNF placement upon discharge: Not anticipated  Patient Condition: [facility], spoken with CM, and patient and Tapia. I met with patient at the bedside for inpatient rehabilitation assessment.  Patient will benefit from ongoing PT and OT, can actively participate in 3 hours of therapy a day 5 days of the week, and can make measurable gains during the admission.  Patient will also benefit from the coordinated team approach during an Inpatient Acute Rehabilitation admission.  The patient will receive intensive therapy as well as Rehabilitation physician, nursing, social worker, and care management interventions.  Due to safety, skin/wound care, disease management, medication administration, pain management, and patient education the patient requires 24 hour a day rehabilitation nursing.  The patient is currently min to mod assist overall with mobility and basic ADLs.  Discharge setting and therapy post discharge at home with home health is anticipated.  Patient has agreed to participate in the Acute Inpatient Rehabilitation Program and will admit today.  Preadmission Screen Completed By:  Ottie GlazierBarbara Boyette RN MSN 08/22/2022 12:10 PM ______________________________________________________________________   Discussed status with Dr. Riley KillSwartz on 08/22/2022 at 1210 and received approval for admission today.  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 Yes Co-Morbidities requiring supervision/potential complications: lymphedema, DM, HTN Due to bladder management, bowel management, safety, skin/wound care, disease management, medication administration, pain management, and patient education, does the patient require 24 hr/day rehab nursing? Yes Does the patient require coordinated care of a physician, rehab nurse, PT, OT to address physical and functional deficits in the context of the above medical diagnosis(es)? Yes Addressing deficits in the following areas: balance, endurance, locomotion, strength, transferring, bowel/bladder control, bathing, dressing, feeding, grooming, toileting, and psychosocial  support Can the patient actively participate in an intensive therapy program of at least 3 hrs of therapy 5 days a week? Yes The potential for patient to make measurable gains while on inpatient rehab is excellent Anticipated functional outcomes upon discharge from inpatient rehab: supervision and min assist PT, supervision and min assist OT, n/a SLP Estimated rehab length of stay to reach the above functional goals is: 7-10 days Anticipated discharge destination: Home 10. Overall Rehab/Functional Prognosis: excellent   MD Signature: Ranelle Oyster, MD, [facility] Physical Medicine & Rehabilitation Medical Director Rehabilitation Services 08/22/2022

## 2022-08-17 NOTE — Plan of Care (Signed)

## 2022-08-17 NOTE — Progress Notes (Signed)
PROGRESS NOTE  Alan Tapia.  DOB: 1976-02-24  PCP: Patient, No Pcp Per LSL:[id number]  DOA: 08/13/2022  LOS: 4 days  Hospital Day: 5  Brief narrative: Alan Tapia. is a 47 y.o. male with PMH significant for morbid obesity, uncontrolled DM, HTN who has a 8-month history of a left foot ulcer for which she was seeing a podiatrist weekly for debridement at [name], last debridement 06/10/2022. He recently moved to [place] due to a family emergency but he was traveling back-and-forth to see the same podiatrist in [name].  He also had a PICC line and was receiving vancomycin infusions.  PICC line fell out before completion of the antibiotic course for which he did not follow-up.   4/3, presented to ED with complaint of progressive swelling, and clear yellow discharge from the foot.  In the ED, patient was afebrile, hemodynamically stable and breathing on room air. MRI left foot showed large plantar foot wound with possible septic arthritis, abscess and worsening osteomyelitis.   EDP discussed with orthopedics Dr. Victorino Dike  Admitted to [facility] with tentative plan of amputation.  Subjective: Patient was seen and examined this morning.   Lying on bed.  Not in distress.  He does not like how IV morphine makes him feel.  Asking for Motrin oral. Wife at bedside. PT recommended CIR.  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 Lajoyce Corners Wound VAC in place. Initially covered on broad-spectrum IV antibiotics.  Per Dr. Audrie Lia note, infection clear with amputation.  Will stop all antibiotics today.  For pain control, I have started him on scheduled Tylenol, scheduled oxycodone and as needed Dilaudid. He does not like how IV morphine makes him feel.  Asking for Motrin oral.  I ordered for scheduled oral Motrin for next 3 days.  New diagnosis of systolic CHF Essential hypertension Patient  had bilateral lower EXTR lymphedema.  Now with left amputation status continues to have lymphedema on the right. 4/7, echocardiogram obtained which showed a new low EF of 35 to 40% with global hypokinesis.  May need ischemic evaluation.  Will involve cardiology PT was not compliant to blood pressure meds.  Because of elevated blood pressure, he is currently on losartan 50 mg daily and Lasix 40 mg daily. With the new echo finding, I started him on carvedilol 3.125 mg twice daily and continued Lasix.  Hold losartan as he may need to be initiated on Entresto.  Acute on chronic anemia Baseline hemoglobin between 9 and 10.  Postop drop noted. Hemoglobin this morning 8.1.  Continue to monitor.  Transfuse if less than 7. Recent Labs    08/13/22 1500 08/14/22 0500 08/15/22 0309 08/16/22 0215 08/17/22 0323  HGB 9.8* 8.7* 8.7* 8.1* 8.0*  MCV 74.7* 71.8* 74.6* 74.2* 73.9*    Type 2 diabetes mellitus uncontrolled with hyperglycemia A1c 12.3 in January 2024.  Repeat A1c on 4/3 is better at 10.3. PTA on on Lantus and Humalog Currently blood sugar level is controlled on Lantus 10 units daily and sliding scale insulin with Accu-Cheks.  Recent Labs  Lab 08/16/22 1133 08/16/22 1559 08/16/22 1954 08/17/22 0723 08/17/22 1143  GLUCAP 173* 194* 177* 98 135*    Hypokalemia Potassium low at 3.4.  Oral replacement ordered. Recent Labs  Lab 08/13/22 1500 08/14/22 0500 08/15/22 0309 08/16/22 0215 08/17/22 0323  K 3.2* 3.1* 3.5 3.4* 3.6    Morbid Obesity  Body mass index is  45.52 kg/m. Patient has been advised to make an attempt to improve diet and exercise patterns to aid in weight loss.  Mobility: PT eval obtained.  CIR recommended  Goals of care   Code Status: Full Code     DVT prophylaxis: Start Lovenox today.  SCD's Start: 08/15/22 1604   Antimicrobials: Okay to stop antibiotics today Fluid: IV fluid stopped Consultants: Orthopedics Family Communication: Wife at  bedside  Status: Inpatient Level of care:  Telemetry Medical   Needs to continue in-hospital care:  New diagnosis of CHF.  May need further workup. CIR recommended by PT  Patient from: Home Anticipated d/c to: Pending clinical course      Diet:  Diet Order             Diet Carb Modified Fluid consistency: Thin; Room service appropriate? Yes  Diet effective now                   Scheduled Meds:  acetaminophen  1,000 mg Oral TID   vitamin C  1,000 mg Oral Daily   carvedilol  3.125 mg Oral BID WC   docusate sodium  100 mg Oral Daily   enoxaparin (LOVENOX) injection  80 mg Subcutaneous Q24H   furosemide  40 mg Oral Daily   ibuprofen  800 mg Oral TID   insulin aspart  0-20 Units Subcutaneous TID WC   insulin aspart  0-5 Units Subcutaneous QHS   insulin glargine-yfgn  10 Units Subcutaneous Daily   multivitamin with minerals  1 tablet Oral Daily   oxyCODONE  5 mg Oral Q6H   pantoprazole  40 mg Oral Daily   zinc sulfate  220 mg Oral Daily    PRN meds: alum & mag hydroxide-simeth, bisacodyl, guaiFENesin-dextromethorphan, hydrALAZINE, hydrALAZINE, hydrocortisone cream, HYDROmorphone (DILAUDID) injection, labetalol, LORazepam, LORazepam, magnesium citrate, magnesium sulfate bolus IVPB, metoprolol tartrate, ondansetron **OR** ondansetron (ZOFRAN) IV, phenol, polyethylene glycol   Infusions:   magnesium sulfate bolus IVPB      Antimicrobials: Anti-infectives (From admission, onward)    Start     Dose/Rate Route Frequency Ordered Stop   08/15/22 1225  ceFAZolin (ANCEF) 2-4 GM/100ML-% IVPB  Status:  Discontinued       Note to Pharmacy: Alan Tapia, Alan Tapia: cabinet override      08/15/22 1225 08/15/22 1246   08/15/22 1156  ceFAZolin (ANCEF) 3-0.9 GM/100ML-% IVPB       Note to Pharmacy: Alan Tapia, Alan Tapia: cabinet override      08/15/22 1156 08/15/22 2359   08/14/22 1300  ceFAZolin (ANCEF) IVPB 3g/100 mL premix  Status:  Discontinued        3 g 200 mL/hr over 30 Minutes  Intravenous On call to O.R. 08/14/22 1128 08/15/22 0559   08/14/22 0215  linezolid (ZYVOX) IVPB 600 mg  Status:  Discontinued       Note to Pharmacy: Patient not tolerating vanc IV even at lower rate, changing to zyvox. Patient denies history of past zyvox reaction. First vanc dose was not fully given.   600 mg 300 mL/hr over 60 Minutes Intravenous Every 12 hours 08/14/22 0124 08/16/22 1120   08/14/22 0200  vancomycin (VANCOREADY) IVPB 2000 mg/400 mL  Status:  Discontinued        2,000 mg 100 mL/hr over 240 Minutes Intravenous Every 12 hours 08/13/22 1750 08/14/22 0225   08/13/22 2200  cefTRIAXone (ROCEPHIN) 2 g in sodium chloride 0.9 % 100 mL IVPB  Status:  Discontinued  2 g 200 mL/hr over 30 Minutes Intravenous Every 24 hours 08/13/22 1646 08/17/22 1202   08/13/22 1345  vancomycin (VANCOREADY) IVPB 2000 mg/400 mL        2,000 mg 200 mL/hr over 120 Minutes Intravenous  Once 08/13/22 1311 08/13/22 1626   08/13/22 1315  metroNIDAZOLE (FLAGYL) IVPB 500 mg  Status:  Discontinued        500 mg 100 mL/hr over 60 Minutes Intravenous Every 12 hours 08/13/22 1307 08/17/22 1202   08/13/22 1315  ceFEPIme (MAXIPIME) 2 g in sodium chloride 0.9 % 100 mL IVPB        2 g 200 mL/hr over 30 Minutes Intravenous  Once 08/13/22 1311 08/13/22 1406       Nutritional status:  Body mass index is 45.52 kg/m.  Nutrition Problem: Increased nutrient needs Etiology: wound healing, post-op healing Signs/Symptoms: estimated needs     Objective: Vitals:   08/17/22 0726 08/17/22 1200  BP:    Pulse: 94 83  Resp: 14 15  Temp:    SpO2: 100% 96%    Intake/Output Summary (Last 24 hours) at 08/17/2022 1358 Last data filed at 08/17/2022 0725 Gross per 24 hour  Intake 300 ml  Output 1775 ml  Net -1475 ml    Filed Weights   08/13/22 1206 08/15/22 1210  Weight: (!) 169.6 kg (!) 169.6 kg   Weight change:  Body mass index is 45.52 kg/m.   Physical Exam: General exam: Pleasant, middle-aged  African-American male. Skin: No rashes, lesions or ulcers. HEENT: Atraumatic, normocephalic, no obvious bleeding Lungs: Clear to auscultation bilaterally. CVS: Regular rate and rhythm, no murmur GI/Abd soft, nontender, nondistended, bowel sound present CNS: Alert, awake, oriented x 3 Psychiatry: Mood appropriate Extremities: Continues to have right lower extremity edema.  Left lower extremity BKA status.  On boot.  Data Review: I have personally reviewed the laboratory data and studies available.  F/u labs ordered Unresulted Labs (From admission, onward)     Start     Ordered   08/20/22 0500  Creatinine, serum  (enoxaparin (LOVENOX)    CrCl >/= 30 ml/min)  Weekly,   R     Comments: while on enoxaparin therapy    08/13/22 1846   08/17/22 0500  CBC  Tomorrow morning,   R        08/16/22 1216            Total time spent in review of labs and imaging, patient evaluation, formulation of plan, documentation and communication with family: 45 minutes  Signed, Lorin Glass, MD [facility] 08/17/2022

## 2022-08-17 NOTE — Anesthesia Postprocedure Evaluation (Signed)
Anesthesia Post Note  Patient: Alan Tapia.  Procedure(s) Performed: LEFT BELOW KNEE AMPUTATION (Left: Knee)     Patient location during evaluation: PACU Anesthesia Type: General Level of consciousness: awake and alert Pain management: pain level controlled Vital Signs Assessment: post-procedure vital signs reviewed and stable Respiratory status: spontaneous breathing, nonlabored ventilation, respiratory function stable and patient connected to nasal cannula oxygen Cardiovascular status: blood pressure returned to baseline and stable Postop Assessment: no apparent nausea or vomiting Anesthetic complications: no  No notable events documented.  Last Vitals:  Vitals:   08/17/22 0445 08/17/22 0724  BP: (!) 154/85   Pulse: 87   Resp: 14   Temp: 36.4 C 36.7 C  SpO2: 100%     Last Pain:  Vitals:   08/17/22 0724  TempSrc: Oral  PainSc:                  Augusten Lipkin

## 2022-08-18 ENCOUNTER — Encounter (HOSPITAL_COMMUNITY): Payer: Self-pay | Admitting: Orthopedic Surgery

## 2022-08-18 ENCOUNTER — Other Ambulatory Visit (HOSPITAL_COMMUNITY): Payer: Self-pay

## 2022-08-18 DIAGNOSIS — M86172 Other acute osteomyelitis, left ankle and foot: Secondary | ICD-10-CM | POA: Diagnosis not present

## 2022-08-18 DIAGNOSIS — E1169 Type 2 diabetes mellitus with other specified complication: Secondary | ICD-10-CM | POA: Diagnosis not present

## 2022-08-18 DIAGNOSIS — E1165 Type 2 diabetes mellitus with hyperglycemia: Secondary | ICD-10-CM | POA: Diagnosis not present

## 2022-08-18 DIAGNOSIS — M86272 Subacute osteomyelitis, left ankle and foot: Secondary | ICD-10-CM | POA: Diagnosis not present

## 2022-08-18 DIAGNOSIS — I160 Hypertensive urgency: Secondary | ICD-10-CM | POA: Diagnosis not present

## 2022-08-18 DIAGNOSIS — E43 Unspecified severe protein-calorie malnutrition: Secondary | ICD-10-CM | POA: Diagnosis not present

## 2022-08-18 DIAGNOSIS — I5021 Acute systolic (congestive) heart failure: Secondary | ICD-10-CM

## 2022-08-18 LAB — GLUCOSE, CAPILLARY
Glucose-Capillary: 91 mg/dL (ref 70–99)
Glucose-Capillary: 96 mg/dL (ref 70–99)
Glucose-Capillary: 118 mg/dL — ABNORMAL HIGH (ref 70–99)
Glucose-Capillary: 149 mg/dL — ABNORMAL HIGH (ref 70–99)
Glucose-Capillary: 166 mg/dL — ABNORMAL HIGH (ref 70–99)

## 2022-08-18 LAB — CULTURE, BLOOD (ROUTINE X 2)
Culture: NO GROWTH
Culture: NO GROWTH

## 2022-08-18 LAB — SURGICAL PATHOLOGY

## 2022-08-18 MED ORDER — CARVEDILOL 6.25 MG PO TABS
6.2500 mg | ORAL_TABLET | Freq: Two times a day (BID) | ORAL | Status: DC
  Administered 2022-08-18 – 2022-08-19 (×2): 6.25 mg via ORAL
  Filled 2022-08-18: qty 1

## 2022-08-18 MED ORDER — INSULIN ASPART 100 UNIT/ML IJ SOLN
0.0000 [IU] | Freq: Three times a day (TID) | INTRAMUSCULAR | Status: DC
  Administered 2022-08-18 – 2022-08-19 (×2): 1 [IU] via SUBCUTANEOUS
  Administered 2022-08-19: 3 [IU] via SUBCUTANEOUS
  Administered 2022-08-19 – 2022-08-21 (×4): 2 [IU] via SUBCUTANEOUS
  Administered 2022-08-22 (×2): 1 [IU] via SUBCUTANEOUS

## 2022-08-18 MED ORDER — SACUBITRIL-VALSARTAN 24-26 MG PO TABS
1.0000 | ORAL_TABLET | Freq: Two times a day (BID) | ORAL | Status: DC
  Administered 2022-08-18 – 2022-08-22 (×6): 1 via ORAL
  Filled 2022-08-18 (×10): qty 1

## 2022-08-18 NOTE — Consult Note (Addendum)
Cardiology Consultation   Patient ID: Alan GinsbergEdward Dedmon Jr. MRN: [id number]; DOB: February 28, 1976  Admit date: 08/13/2022 Date of Consult: 08/18/2022  PCP:  Patient, No Pcp Per   [facility] Providers Cardiologist:  New to Dr. Shari ProwsPemberton  Patient Profile:   Alan Ginsbergdward Cattell Jr. is a 47 y.o. male with a hx of morbid obesity, uncontrolled DM, uncontrolled HTN, chronic left foot ulcer, chronic anemia,  medical non-compliance, who is being seen 08/18/2022 for the evaluation of abnormal Echo at the request of Dr Pola Cornahal.  History of Present Illness:   Per chart review, Alan Tapia with above PMH presented to ER 08/13/22 for worsening of chronic left foot ulcer, with development of increased swelling and drainage from the wound. He had ongoing debridements and treatment of his left foot ulcer at [place] and was advised taking systemic antibiotic vancomycin via PICC before current admission. [facility],  where his PICC line had fallen out over 1 month ago. MRI of foot at admission showed osteomyelitis with abscess. Initial labs showed leukocytosis, anemia, mild hyponatremia. He was admitted for septic arthritis/osteomyelitis. Ortho was consulted, he was reportedly non-compliant with his treatment [facility] Dr Marcheta GrammesMa and felt left BKA would be reasonable 6 month ago. 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 Additionally he was found to have uncontrolled type 2 DM with A1C 10.3%, uncontrolled HTN while non-complaint with his antihypertensive.  Due to chronic BLE lymphedema, Echo was done 08/17/22 showed  LVEF 35-40%, global hypokinesis, mild LV dilation, indeterminate diastolic parameters, normal RV, mild LAE, mild MR. He was given losartan, lasix, coreg for HTN and suspected CHF. Cardiology is consulted for further input today.   Upon encounter, patient states he has been dealing with his left foot wound infection over almost 3 years. He had ongoing infection requiring antibiotic as well as  wound debridement. He was weight bearing when he was not suppose to. He reports chronic bilateral lower legs swelling since he had a left foot wound infection, prior to that he did not have any swelling. He reports intermittent chest pain or SOB with exertion when his left foot wound infection and pain was bad, where he would have pain all over his body. He denied any episode of isolated chest pain or SOB or syncope. Even with the left foot pain and above symptoms, he was able to walk 2 blocks or climb a flight of stairs without issue.  He states he had some cardiac workup in [place] when his foot infection was bad, it was normal, but he can't recall what study he had done. He states he is complaint with his medication, he was on insulin for DM as well as 2 medications he can't name for HTN. He felt his main issue is his left foot infection and disagree that he has CHF. He reports his father and grandfather had MI, unknown age onset. He denied tobacco use, ETOH use, illicit drug use. 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 He is moving to [place] and will be located in [place] going forward. He denied ever having diagnosis of CHF, MI, or endocarditis.      Past Medical History:  Diagnosis Date   Hypertension    Uncontrolled type 2 diabetes mellitus with hyperglycemia, with long-term current use of insulin 06/06/2022    Past Surgical History:  Procedure Laterality Date   AMPUTATION Left 08/15/2022   Procedure: LEFT BELOW KNEE AMPUTATION;  Surgeon: Nadara Mustarduda, Marcus V, MD;  [facility];  Service: Orthopedics;  Laterality: Left;     Home Medications:  Prior to Admission medications   Medication Sig Start Date End Date Taking? Authorizing Provider  ibuprofen (ADVIL,MOTRIN) 200 MG tablet Take 800 mg by mouth daily.   Yes [provider]  insulin glargine (LANTUS) 100 UNIT/ML injection Inject 3 Units into the skin daily. Injecting 3 units once daily   Yes [provider]   insulin lispro (HUMALOG) 100 UNIT/ML KwikPen Inject 6 Units into the skin 3 (three) times daily. 12/30/21  Yes [provider]  vancomycin IVPB Inject 1,000 mg into the vein. 06/16/22 09/18/22 Yes [provider]  oxycodone (OXY-IR) 5 MG capsule Take 5 mg by mouth every 4 (four) hours as needed for pain. Q4-6 hours Patient not taking: Reported on 08/15/2022 06/16/22   [provider]    Inpatient Medications: Scheduled Meds:  acetaminophen  1,000 mg Oral TID   vitamin C  1,000 mg Oral Daily   carvedilol  3.125 mg Oral BID WC   docusate sodium  100 mg Oral Daily   enoxaparin (LOVENOX) injection  80 mg Subcutaneous Q24H   furosemide  40 mg Oral Daily   ibuprofen  800 mg Oral TID   insulin aspart  0-5 Units Subcutaneous QHS   insulin aspart  0-9 Units Subcutaneous TID WC   insulin glargine-yfgn  10 Units Subcutaneous Daily   multivitamin with minerals  1 tablet Oral Daily   oxyCODONE  5 mg Oral Q6H   pantoprazole  40 mg Oral Daily   zinc sulfate  220 mg Oral Daily   Continuous Infusions:  magnesium sulfate bolus IVPB     PRN Meds: alum & mag hydroxide-simeth, bisacodyl, guaiFENesin-dextromethorphan, hydrALAZINE, hydrALAZINE, hydrocortisone cream, HYDROmorphone (DILAUDID) injection, labetalol, LORazepam, LORazepam, magnesium citrate, magnesium sulfate bolus IVPB, metoprolol tartrate, ondansetron **OR** ondansetron (ZOFRAN) IV, phenol, polyethylene glycol  Allergies:    Allergies  Allergen Reactions   Fish Allergy Anaphylaxis, Hives and Other (See Comments)   Benadryl [Diphenhydramine] Swelling    Pt reports cannot take pill form of benadryl   Vancomycin     Redman's-type rash on 08/13/22    Social History:   Social History   Socioeconomic History   Marital status: Single    Spouse name: Not on file   Number of children: Not on file   Years of education: Not on file   Highest education level: Not on file  Occupational History   Not on file  Tobacco Use    Smoking status: Never   Smokeless tobacco: Never  Substance and Sexual Activity   Alcohol use: No   Drug use: No   Sexual activity: Not on file  Other Topics Concern   Not on file  Social History Narrative   Not on file   Social Determinants of Health   Financial Resource Strain: Not on file  Food Insecurity: No Food Insecurity (08/13/2022)   Hunger Vital Sign    Worried About Running Out of Food in the Last Year: Never true    Ran Out of Food in the Last Year: Never true  Transportation Needs: No Transportation Needs (08/13/2022)   PRAPARE - Administrator, Civil Service (Medical): No    Lack of Transportation (Non-Medical): No  Physical Activity: Not on file  Stress: Not on file  Social Connections: Not on file  Intimate Partner Violence: Not At Risk (08/13/2022)   Humiliation, Afraid, Rape, and Kick questionnaire    Fear of Current  or Ex-Partner: No    Emotionally Abused: No    Physically Abused: No    Sexually Abused: No    Family History:    Family History  Problem Relation Age of Onset   Arthritis Mother    Allergies Father    Hypertension Father    Diabetes Father      ROS:  Constitutional:fatigue  Eyes: Denied vision change or loss Ears/Nose/Mouth/Throat: Denied ear ache, sore throat, coughing, sinus pain Cardiovascular: see HPI  Respiratory: see HPI  Gastrointestinal: Denied nausea, vomiting, abdominal pain, diarrhea Genital/Urinary: Denied dysuria, hematuria, urinary frequency/urgency Musculoskeletal: see HPI  Skin: see HPI  Neuro: Denied headache, dizziness, syncope Psych: Denied history of depression/anxiety  Endocrine: history of diabetes   Physical Exam/Data:   Vitals:   08/17/22 1615 08/17/22 1700 08/17/22 2119 08/18/22 0752  BP: (!) 150/82  (!) 145/79 (!) 161/95  Pulse: 89  88 86  Resp: 20  18 18   Temp: 98.3 F (36.8 C)  97.8 F (36.6 C) 97.6 F (36.4 C)  TempSrc: Oral   Oral  SpO2:  99% 96% 100%  Weight:      Height:         Intake/Output Summary (Last 24 hours) at 08/18/2022 1111 Last data filed at 08/17/2022 1500 Gross per 24 hour  Intake 360 ml  Output 400 ml  Net -40 ml      08/15/2022   12:10 PM 08/13/2022   12:06 PM 06/06/2022    1:30 PM  Last 3 Weights  Weight (lbs) 374 lb 374 lb 375 lb  Weight (kg) 169.645 kg 169.645 kg 170.099 kg     Body mass index is 45.52 kg/m.   Vitals:  Vitals:   08/17/22 2119 08/18/22 0752  BP: (!) 145/79 (!) 161/95  Pulse: 88 86  Resp: 18 18  Temp: 97.8 F (36.6 C) 97.6 F (36.4 C)  SpO2: 96% 100%   General Appearance: In no apparent distress, laying in bed, morbid obese  HEENT: Normocephalic, atraumatic.  Neck: Supple, trachea midline, no JVDs Cardiovascular: Regular rate and rhythm, normal S1-S2,  no murmur Respiratory: Resting breathing unlabored, lungs sounds clear to auscultation bilaterally, no use of accessory muscles. On room air.  No wheezes, rales or rhonchi.   Gastrointestinal: Bowel sounds positive, abdomen soft, non-tender Extremities: s/p left BKA, immobilizer in place /not removed for exam; RLE 1+ edema  Musculoskeletal: Normal muscle bulk and tone Skin: Intact, warm, dry. No rashes  Neurologic: Alert, oriented to person, place and time. Fluent speech, no cognitive deficit, no gross focal neuro deficit Psychiatric: Normal affect. Mood is appropriate.     EKG:  The EKG was personally reviewed and demonstrates:  No EKG available on file, will order EKG for baseline evaluation today   Telemetry:  Telemetry was personally reviewed and demonstrates: sinus rhythm   Relevant CV Studies:   Echo from 08/17/22:   1. Left ventricular ejection fraction, by estimation, is 35 to 40%. The  left ventricle has moderately decreased function. The left ventricle  demonstrates global hypokinesis. The left ventricular internal cavity size  was mildly dilated. Indeterminate  diastolic filling due to E-A fusion.   2. Right ventricular systolic function is  normal. The right ventricular  size is normal. Tricuspid regurgitation signal is inadequate for assessing  PA pressure.   3. Left atrial size was mildly dilated.   4. The mitral valve is grossly normal. Mild mitral valve regurgitation.   5. The aortic valve is tricuspid. Aortic valve regurgitation  is not  visualized.   6. The inferior vena cava is dilated in size with <50% respiratory  variability, suggesting right atrial pressure of 15 mmHg.   Laboratory Data:  High Sensitivity Troponin:  No results for input(s): "TROPONINIHS" in the last 720 hours.   Chemistry Recent Labs  Lab 08/15/22 0309 08/16/22 0215 08/17/22 0323  NA 134* 132* 136  K 3.5 3.4* 3.6  CL 104 101 103  CO2 24 23 25   GLUCOSE 130* 247* 142*  BUN 6 7 6   CREATININE 1.18 1.11 1.14  CALCIUM 7.5* 7.4* 7.6*  GFRNONAA >60 >60 >60  ANIONGAP 6 8 8     No results for input(s): "PROT", "ALBUMIN", "AST", "ALT", "ALKPHOS", "BILITOT" in the last 168 hours. Lipids No results for input(s): "CHOL", "TRIG", "HDL", "LABVLDL", "LDLCALC", "CHOLHDL" in the last 168 hours.  Hematology Recent Labs  Lab 08/15/22 0309 08/16/22 0215 08/17/22 0323  WBC 9.3 8.6 8.1  RBC 3.90* 3.56* 3.49*  HGB 8.7* 8.1* 8.0*  HCT 29.1* 26.4* 25.8*  MCV 74.6* 74.2* 73.9*  MCH 22.3* 22.8* 22.9*  MCHC 29.9* 30.7 31.0  RDW 15.4 15.2 15.6*  PLT 388 367 397   Thyroid No results for input(s): "TSH", "FREET4" in the last 168 hours.  BNPNo results for input(s): "BNP", "PROBNP" in the last 168 hours.  DDimer No results for input(s): "DDIMER" in the last 168 hours.   Radiology/Studies:  ECHOCARDIOGRAM COMPLETE  Result Date: 08/17/2022    ECHOCARDIOGRAM REPORT   Patient Name:   Alan Terrebonne. Date of Exam: 08/17/2022 Medical Rec #:  [id number]          Height:       76.0 in Accession #:    [id number]         Weight:       374.0 lb Date of Birth:  03-14-1976          BSA:          2.890 m Patient Age:    47 years           BP:           169/93 mmHg  Patient Gender: M                  HR:           86 bpm. Exam Location:  Inpatient Procedure: 2D Echo, Color Doppler and Cardiac Doppler Indications:    R94.31 Abnormal EKG  History:        Patient has no prior history of Echocardiogram examinations.                 Risk Factors:Hypertension and Diabetes.  Sonographer:    Irving Burton Senior RDCS Referring Phys: Lorin Glass  Sonographer Comments: Suboptimal apical window due to patient body habitus. IMPRESSIONS  1. Left ventricular ejection fraction, by estimation, is 35 to 40%. The left ventricle has moderately decreased function. The left ventricle demonstrates global hypokinesis. The left ventricular internal cavity size was mildly dilated. Indeterminate diastolic filling due to E-A fusion.  2. Right ventricular systolic function is normal. The right ventricular size is normal. Tricuspid regurgitation signal is inadequate for assessing PA pressure.  3. Left atrial size was mildly dilated.  4. The mitral valve is grossly normal. Mild mitral valve regurgitation.  5. The aortic valve is tricuspid. Aortic valve regurgitation is not visualized.  6. The inferior vena cava is dilated in size with <50% respiratory variability, suggesting right atrial pressure of 15 mmHg.  FINDINGS  Left Ventricle: Left ventricular ejection fraction, by estimation, is 35 to 40%. The left ventricle has moderately decreased function. The left ventricle demonstrates global hypokinesis. The left ventricular internal cavity size was mildly dilated. There is no left ventricular hypertrophy. Indeterminate diastolic filling due to E-A fusion. Indeterminate filling pressures. Right Ventricle: The right ventricular size is normal. No increase in right ventricular wall thickness. Right ventricular systolic function is normal. Tricuspid regurgitation signal is inadequate for assessing PA pressure. Left Atrium: Left atrial size was mildly dilated. Right Atrium: Right atrial size was normal in size.  Pericardium: There is no evidence of pericardial effusion. Mitral Valve: The mitral valve is grossly normal. Mild mitral valve regurgitation. Tricuspid Valve: The tricuspid valve is grossly normal. Tricuspid valve regurgitation is trivial. Aortic Valve: The aortic valve is tricuspid. Aortic valve regurgitation is not visualized. Pulmonic Valve: The pulmonic valve was grossly normal. Pulmonic valve regurgitation is not visualized. Aorta: The aortic root and ascending aorta are structurally normal, with no evidence of dilitation. Venous: The inferior vena cava is dilated in size with less than 50% respiratory variability, suggesting right atrial pressure of 15 mmHg. IAS/Shunts: No atrial level shunt detected by color flow Doppler.  LEFT VENTRICLE PLAX 2D LVIDd:         5.80 cm      Diastology LVIDs:         4.70 cm      LV e' medial:    6.53 cm/s LV PW:         1.00 cm      LV E/e' medial:  12.7 LV IVS:        0.90 cm      LV e' lateral:   6.42 cm/s LVOT diam:     2.30 cm      LV E/e' lateral: 13.0 LV SV:         78 LV SV Index:   27 LVOT Area:     4.15 cm  LV Volumes (MOD) LV vol d, MOD A2C: 169.0 ml LV vol d, MOD A4C: 182.0 ml LV vol s, MOD A2C: 107.0 ml LV vol s, MOD A4C: 114.0 ml LV SV MOD A2C:     62.0 ml LV SV MOD A4C:     182.0 ml LV SV MOD BP:      62.7 ml RIGHT VENTRICLE RV S prime:     15.00 cm/s TAPSE (M-mode): 2.4 cm LEFT ATRIUM           Index        RIGHT ATRIUM           Index LA diam:      4.20 cm 1.45 cm/m   RA Area:     12.40 cm LA Vol (A2C): 69.9 ml 24.18 ml/m  RA Volume:   29.70 ml  10.28 ml/m LA Vol (A4C): 83.6 ml 28.94 ml/m  AORTIC VALVE LVOT Vmax:   95.10 cm/s LVOT Vmean:  67.000 cm/s LVOT VTI:    0.188 m  AORTA Ao Root diam: 3.00 cm Ao Asc diam:  2.90 cm MITRAL VALVE MV Area (PHT): 3.34 cm    SHUNTS MV Decel Time: 227 msec    Systemic VTI:  0.19 m MV E velocity: 83.20 cm/s  Systemic Diam: 2.30 cm Rachelle Hora Croitoru MD Electronically signed by Thurmon Fair MD Signature Date/Time:  08/17/2022/11:18:29 AM    Final      Assessment and Plan:   Dilated cardiomyopathy of unclear etiology  - Presented  with left foot chronic ulcer with osteomyelitis, s/p left BKA on 08/15/22  - has chronic BLE edema for 3 years since left foot wound infection started, also noted uncontrolled HTN/DM (patient states he is compliant with medications although there is report of non-compliance) - Echo from 08/17/22 showed LVEF 35-40%, global hypokinesis, mild LV dilation, indeterminate diastolic parameters, normal RV, mild LAE, mild MR.  - Etiology unclear, ischemic CM can't be ruled out as he reports intermittent chest pain/SOB with exertion when his left foot infection /pain is worse and he has risk factors; stress induced CM is possible with current infection; long standing history of uncontrolled HTN is another differential  - Would repeat Echo in 4-6 weeks, allow time to recover from current infection/surgery, if EF remains down and he continue to have possible angina symptoms, consider ischemic evaluation with gated CT - Clinically euvolemic today, historically may be having symptoms from uncontrolled left foot pain/infection rather CHF, lasix 40mg  daily is OK, track weight and I&Os - GDMT: agree with Coreg 3.125mg  BID, will add Entresto 24/26mg  BID, if BP allows and renal functio stable, add spironolactone and farxiga over the next few days (patient is not happy about taking a lot new meds, lengthy discussion done for GDMT for CHF, he is agreeable at this time)   HTN - BP remains high 150-160s systolic currently  - on 2 agents PTA, does not know name  - continue coreg, lasix, add Entresto, more GDMT to add as above   Chronic left foot diabetic ulcer with acute on chronic osteomyelitis s/p left BKA  - blood culture from admission remains negative  - no obvious vegetation  noted on TTE, no date on previous microbiology [name], consider TEE if there is concern of endocarditis/defer to primary team    Uncontrolled type 2 DM Obesity  - per primary team    Risk Assessment/Risk Scores:  {  New York Heart Association (NYHA) Functional Class NYHA Class I   For questions or updates, please contact [facility] Please consult [email or web address] for contact info under    Signed, Cyndi Bender, NP  08/18/2022 11:11 AM  Patient seen and examined and agree with Cyndi Bender, NP as detailed above.  In brief, the patient is a 47 year old male with history of poorly controlled DMII, uncontrolled HTN, lymphedema, chronic left foot diabetic ulcer, morbid obesity and medication noncompliance who presented with worsening left lower extremity ulcer swelling and draining found to have osteomyelitis/septic arthritis and ultimately underwent BKA. TTE this admission that was obtained for LE edema showed EF 35-40% global hypokinesis, mild LV dilation, indeterminate diastolic parameters, normal RV, mild LAE, mild MR. Cardiology is now consulted for newly diagnosed systolic HF.  Per review of the record, patient with prior TTE in 12/2021 from OSH where EF 50-55% which has dropped to 35-40% on TTE this admission. Does not appear to have prior ischemic work-up at outside facility, although he is high risk of underlying ischemic disease given risk factors. May also be a result of poorly controlled HTN vs stress induced in the setting of sepsis. Recommend optimizing GDMT at this time as tolerated with plans to follow-up with Cardiology as outpatient for repeat TTE to reassess EF. If EF remains depressed, will need ischemic work-up at that time.   GEN: No acute distress.   Neck: No JVD Cardiac: RRR, no murmurs Respiratory: Clear to auscultation bilaterally. GI: Obese, soft, NTTP MS: Left BKA, 2+ RLE edema (warm) Neuro:  Nonfocal  Psych: Normal  affect    Plan: -Will optimize GDMT as able as inpatient with plans for ischemic work-up as outpatient; may consider repeat limited TTE to reassess EF once recovered  from acute illness and if persistently low, will need cath -Start entresto 24/26mg  BID -Increase coreg to 6.25mg  BID -Add spiro tomorrow if renal function stable -Will not add SGLT2i for now; plan to add once DM better controlled -Management of DM per primary team   Laurance Flatten, MD

## 2022-08-18 NOTE — Progress Notes (Addendum)
Inpatient Rehab Admissions Coordinator:  Await medical clearance for potential CIR admission. Will continue to follow.  ADDENDUM 1622: Discussed with pt and wife that pt's insurance is out of network for CIR. Discussed that insurance authorization was able to be started but not sure if pt has out of network benefits. Discussed that if insurance authorization is not approved, pt could come to CIR as self pay. Pt and wife acknowledged understanding. Pt is still interested in pursuing CIR. Wife supportive.   Wolfgang Phoenix, MS, CCC-SLP Admissions Coordinator [phone]

## 2022-08-18 NOTE — Progress Notes (Signed)
Patient ID: Alan Tapia., male   DOB: 1975-09-10, 47 y.o.   MRN: [id number] Patient is a 47 year old gentleman status post transtibial amputation.  There is still only 50 cc in the wound VAC canister.  Anticipate discharge to CIR.

## 2022-08-18 NOTE — Progress Notes (Signed)
Physical Therapy Treatment Patient Details Name: Alan Tapia. MRN: [id number] DOB: [birth year] Today's Date: 08/18/2022   History of Present Illness Pt is a 47 y.o. male admitted 4/3 with 6 mo history of L foot ulcer; Presenting with swelling and progressive yellow discharge from the foot. 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 PMH significant for DMII, HTN.    PT Comments    Further education for post-op BKA care and precautions. Successful squat pivot transfer training today with min assist +2 for safety. Verbally reviewed sliding board use for return to bed similar to last visit. Attempted to stand from various surfaces today of lower height however unable to complete full stand due to RLE weakness and fatigue. Patient will continue to benefit from skilled physical therapy services to further improve independence with functional mobility.    Recommendations for follow up therapy are one component of a multi-disciplinary discharge planning process, led by the attending physician.  Recommendations may be updated based on patient status, additional functional criteria and insurance authorization.  Follow Up Recommendations  Can patient physically be transported by private vehicle: No    Assistance Recommended at Discharge Frequent or constant Supervision/Assistance  Patient can return home with the following Two people to help with walking and/or transfers;Two people to help with bathing/dressing/bathroom;Assistance with cooking/housework;Assist for transportation;Help with stairs or ramp for entrance   Equipment Recommendations   (TBD next venue of care - acutely would benefit from bariatric rolling walker.)    Recommendations for Other Services Rehab consult     Precautions / Restrictions Precautions Precautions: Fall;Knee Precaution Booklet Issued: Yes (comment) (Reviewed BKA precautions) Required Braces or Orthoses: Other Brace (amputation splint) Restrictions Weight Bearing Restrictions:  Yes LLE Weight Bearing: Non weight bearing     Mobility  Bed Mobility               General bed mobility comments: Sitting EOB    Transfers Overall transfer level: Needs assistance Equipment used: Rolling walker (2 wheels) Transfers: Sit to/from Stand, Bed to chair/wheelchair/BSC Sit to Stand: +2 physical assistance, Total assist, From elevated surface     Squat pivot transfers: Min assist, From elevated surface, +2 safety/equipment     General transfer comment: Lowered bed slightly today to attempt more consistent sit<>stand transfer however fatigued quickly and unable to fully rise to work on pivot transfers with RW desipte +2 assist. Opted to practice squat pivot transfer which was well tolerated, with cues for RLE placement, to maintain NWB on LLE and min assist for weight shfit and +2 assist for safety. Attempted additional stand from recliner with use of horiziontal rail and bed elevated.    Ambulation/Gait                   Stairs             Wheelchair Mobility    Modified Rankin (Stroke Patients Only)       Balance Overall balance assessment: Needs assistance Sitting-balance support: No upper extremity supported, Feet supported Sitting balance-Leahy Scale: Fair                                      Cognition Arousal/Alertness: Awake/alert Behavior During Therapy: WFL for tasks assessed/performed Overall Cognitive Status: Within Functional Limits for tasks assessed  Exercises      General Comments General comments (skin integrity, edema, etc.): Adjusted ampushield, educated on use, skin checks, adjustments, and precautions.      Pertinent Vitals/Pain Pain Assessment Pain Assessment: Faces Faces Pain Scale: Hurts even more Pain Location: Lt residual limb surgical site Pain Descriptors / Indicators: Aching, Constant (+ phantom pains) Pain Intervention(s):  Monitored during session, Repositioned    Home Living                          Prior Function            PT Goals (current goals can now be found in the care plan section) Acute Rehab PT Goals Patient Stated Goal: get well PT Goal Formulation: With patient/family Time For Goal Achievement: 08/30/22 Potential to Achieve Goals: Good Progress towards PT goals: Progressing toward goals    Frequency    Min 4X/week      PT Plan Current plan remains appropriate    Co-evaluation              AM-PAC PT "6 Clicks" Mobility   Outcome Measure  Help needed turning from your back to your side while in a flat bed without using bedrails?: A Little Help needed moving from lying on your back to sitting on the side of a flat bed without using bedrails?: A Little Help needed moving to and from a bed to a chair (including a wheelchair)?: Total Help needed standing up from a chair using your arms (e.g., wheelchair or bedside chair)?: Total Help needed to walk in hospital room?: Total Help needed climbing 3-5 steps with a railing? : Total 6 Click Score: 10    End of Session Equipment Utilized During Treatment: Gait belt (amputee splint) Activity Tolerance: Patient tolerated treatment well Patient left: with family/visitor present;in chair;with call bell/phone within reach;with chair alarm set Nurse Communication: Mobility status;Precautions (Sliding board use, in gym) PT Visit Diagnosis: Unsteadiness on feet (R26.81);Muscle weakness (generalized) (M62.81);Difficulty in walking, not elsewhere classified (R26.2);Pain Pain - Right/Left: Left Pain - part of body: Leg     Time: 6384-5364 PT Time Calculation (min) (ACUTE ONLY): 26 min  Charges:  $Therapeutic Activity: 23-37 mins                     Kathlyn Sacramento, PT, DPT Physical Therapist Acute Rehabilitation Services [facility] Outpatient Rehabilitation Services [facility]    Berton Mount 08/18/2022, 4:09 PM

## 2022-08-18 NOTE — Progress Notes (Signed)
PROGRESS NOTE  Alan Tapia.  DOB: [birth year]  PCP: Patient, No Pcp Per WYS:[id number]  DOA: 08/13/2022  LOS: 5 days  Hospital Day: 6  Brief narrative: Alan Tapia. is a 47 y.o. male with PMH significant for morbid obesity, uncontrolled DM, HTN who has a 2-month history of a left foot ulcer for which she was seeing a podiatrist weekly for debridement at [name], last debridement 06/10/2022. He recently moved to [place] due to a family emergency but he was traveling back-and-forth to see the same podiatrist in [name].  He also had a PICC line and was receiving vancomycin infusions.  PICC line fell out before completion of the antibiotic course for which he did not follow-up.   4/3, presented to ED with complaint of progressive swelling, and clear yellow discharge from the foot.  In the ED, patient was afebrile, hemodynamically stable and breathing on room air. MRI left foot showed large plantar foot wound with possible septic arthritis, abscess and worsening osteomyelitis.   EDP discussed with orthopedics 4/5, underwent left BKA by Dr. Lajoyce Corners  Subjective: Patient was seen and examined this morning.   Lying on bed.  Not in distress.  Pain better controlled. We talked about his echo findings from yesterday.  Cardiology evaluation was obtained.  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 Lajoyce Corners Wound VAC in place. Initially covered on broad-spectrum IV antibiotics.  Per Dr. Audrie Lia note, infection was clear with amputation and hence antibiotics was stopped after 24 hours of surgery. Currently on pain control with scheduled Tylenol, scheduled oxycodone and as needed Dilaudid.  Per patient's preference, he is also on oral Motrin.  New diagnosis of systolic CHF Essential hypertension Patient had bilateral lower EXTR lymphedema.  Now with left amputation status continues to  have lymphedema on the right. 4/7, echocardiogram obtained which showed a new low EF of 35 to 40% with global hypokinesis. Cardiology consultation obtained. Currently on Coreg 3.125 mg twice daily and Lasix 40 mg daily.  Entresto 24/26 mg twice daily added today.  Per cardiology, repeat echo in 4 to 6 weeks.  Ischemia evaluation may be needed if EF remains down. Net IO Since Admission: 5,040.86 mL [08/18/22 1328] Continue to monitor for daily intake output, weight, blood pressure, BNP, renal function and electrolytes. Recent Labs  Lab 08/13/22 1500 08/14/22 0500 08/15/22 0309 08/16/22 0215 08/17/22 0323  BUN 7 9 6 7 6   CREATININE 0.98 1.16 1.18 1.11 1.14  K 3.2* 3.1* 3.5 3.4* 3.6    Acute on chronic anemia Baseline hemoglobin between 9 and 10.  Postop drop noted. Hemoglobin remained stable between 8 and 9 for the last 4 to.  Continue to monitor.  Transfuse if less than 7. Recent Labs    08/13/22 1500 08/14/22 0500 08/15/22 0309 08/16/22 0215 08/17/22 0323  HGB 9.8* 8.7* 8.7* 8.1* 8.0*  MCV 74.7* 71.8* 74.6* 74.2* 73.9*   Type 2 diabetes mellitus uncontrolled with hyperglycemia A1c 12.3 in January 2024.  Repeat A1c on 4/3 is better at 10.3. PTA on on Lantus and Humalog Currently blood sugar level is controlled on Lantus 10 units daily and sliding scale insulin with Accu-Cheks.  Recent Labs  Lab 08/17/22 1539 08/17/22 2143 08/18/22 0200 08/18/22 0752 08/18/22 1139  GLUCAP 138* 120* 91 96 118*   Morbid Obesity  Body mass index is 45.52 kg/m. Patient has been advised to make an attempt to improve diet  and exercise patterns to aid in weight loss.  Mobility: PT eval obtained.  CIR recommended  Goals of care   Code Status: Full Code     DVT prophylaxis: Lovenox   Antimicrobials: Not on antibiotics Fluid: IV fluid stopped Consultants: Orthopedics, cardiology Family Communication: Wife at bedside  Status: Inpatient Level of care:  Telemetry Medical   Needs to  continue in-hospital care:  New diagnosis of CHF.  Pending CIR approval  Patient from: Home Anticipated d/c to: CIR      Diet:  Diet Order             Diet Carb Modified Fluid consistency: Thin; Room service appropriate? Yes  Diet effective now                   Scheduled Meds:  acetaminophen  1,000 mg Oral TID   vitamin C  1,000 mg Oral Daily   carvedilol  6.25 mg Oral BID WC   docusate sodium  100 mg Oral Daily   enoxaparin (LOVENOX) injection  80 mg Subcutaneous Q24H   furosemide  40 mg Oral Daily   ibuprofen  800 mg Oral TID   insulin aspart  0-5 Units Subcutaneous QHS   insulin aspart  0-9 Units Subcutaneous TID WC   insulin glargine-yfgn  10 Units Subcutaneous Daily   multivitamin with minerals  1 tablet Oral Daily   oxyCODONE  5 mg Oral Q6H   pantoprazole  40 mg Oral Daily   sacubitril-valsartan  1 tablet Oral BID   zinc sulfate  220 mg Oral Daily    PRN meds: alum & mag hydroxide-simeth, bisacodyl, guaiFENesin-dextromethorphan, hydrALAZINE, hydrALAZINE, hydrocortisone cream, HYDROmorphone (DILAUDID) injection, labetalol, LORazepam, LORazepam, magnesium citrate, magnesium sulfate bolus IVPB, metoprolol tartrate, ondansetron **OR** ondansetron (ZOFRAN) IV, phenol, polyethylene glycol   Infusions:   magnesium sulfate bolus IVPB      Antimicrobials: Anti-infectives (From admission, onward)    Start     Dose/Rate Route Frequency Ordered Stop   08/15/22 1225  ceFAZolin (ANCEF) 2-4 GM/100ML-% IVPB  Status:  Discontinued       Note to Pharmacy: Alan Tapia, Alan Tapia: cabinet override      08/15/22 1225 08/15/22 1246   08/15/22 1156  ceFAZolin (ANCEF) 3-0.9 GM/100ML-% IVPB       Note to Pharmacy: Alan Tapia: cabinet override      08/15/22 1156 08/15/22 2359   08/14/22 1300  ceFAZolin (ANCEF) IVPB 3g/100 mL premix  Status:  Discontinued        3 g 200 mL/hr over 30 Minutes Intravenous On call to O.R. 08/14/22 1128 08/15/22 0559   08/14/22 0215  linezolid  (ZYVOX) IVPB 600 mg  Status:  Discontinued       Note to Pharmacy: Patient not tolerating vanc IV even at lower rate, changing to zyvox. Patient denies history of past zyvox reaction. First vanc dose was not fully given.   600 mg 300 mL/hr over 60 Minutes Intravenous Every 12 hours 08/14/22 0124 08/16/22 1120   08/14/22 0200  vancomycin (VANCOREADY) IVPB 2000 mg/400 mL  Status:  Discontinued        2,000 mg 100 mL/hr over 240 Minutes Intravenous Every 12 hours 08/13/22 1750 08/14/22 0225   08/13/22 2200  cefTRIAXone (ROCEPHIN) 2 g in sodium chloride 0.9 % 100 mL IVPB  Status:  Discontinued        2 g 200 mL/hr over 30 Minutes Intravenous Every 24 hours 08/13/22 1646 08/17/22 1202   08/13/22 1345  vancomycin (VANCOREADY) IVPB 2000 mg/400 mL        2,000 mg 200 mL/hr over 120 Minutes Intravenous  Once 08/13/22 1311 08/13/22 1626   08/13/22 1315  metroNIDAZOLE (FLAGYL) IVPB 500 mg  Status:  Discontinued        500 mg 100 mL/hr over 60 Minutes Intravenous Every 12 hours 08/13/22 1307 08/17/22 1202   08/13/22 1315  ceFEPIme (MAXIPIME) 2 g in sodium chloride 0.9 % 100 mL IVPB        2 g 200 mL/hr over 30 Minutes Intravenous  Once 08/13/22 1311 08/13/22 1406       Nutritional status:  Body mass index is 45.52 kg/m.  Nutrition Problem: Increased nutrient needs Etiology: wound healing, post-op healing Signs/Symptoms: estimated needs     Objective: Vitals:   08/17/22 2119 08/18/22 0752  BP: (!) 145/79 (!) 161/95  Pulse: 88 86  Resp: 18 18  Temp: 97.8 F (36.6 C) 97.6 F (36.4 C)  SpO2: 96% 100%    Intake/Output Summary (Last 24 hours) at 08/18/2022 1328 Last data filed at 08/17/2022 1500 Gross per 24 hour  Intake 360 ml  Output 400 ml  Net -40 ml   Filed Weights   08/13/22 1206 08/15/22 1210  Weight: (!) 169.6 kg (!) 169.6 kg   Weight change:  Body mass index is 45.52 kg/m.   Physical Exam: General exam: Pleasant, middle-aged African-American male. Skin: No rashes,  lesions or ulcers. HEENT: Atraumatic, normocephalic, no obvious bleeding Lungs: Clear to auscultation bilaterally. CVS: Regular rate and rhythm, no murmur GI/Abd soft, nontender, nondistended, bowel sound present CNS: Alert, awake, oriented x 3 Psychiatry: Mood appropriate Extremities: Improving right lower extremity edema.  Left lower extremity BKA status.  On boot.  Data Review: I have personally reviewed the laboratory data and studies available.  F/u labs ordered Unresulted Labs (From admission, onward)     Start     Ordered   08/20/22 0500  Creatinine, serum  (enoxaparin (LOVENOX)    CrCl >/= 30 ml/min)  Weekly,   R     Comments: while on enoxaparin therapy    08/13/22 1846   08/20/22 0500  CBC  Once,   R        08/18/22 1301            Total time spent in review of labs and imaging, patient evaluation, formulation of plan, documentation and communication with family: 45 minutes  Signed, Lorin GlassBinaya Kellin Bartling, MD [facility] 08/18/2022

## 2022-08-18 NOTE — Progress Notes (Signed)
   [name]   PCP: Patient, No Pcp Per PCP-Cardiologist: None    HPI:  47 yo M with PMH of chronic osteomyelitis, T2DM, and HTN.   Presented to the ED on 4/3 with L foot infection. Reports he was being treated for osteomyelitis in [name] but PICC has fallen out and he was unable to get vancomycin for 4 weeks. Recently moved to [facility]. In the ED, MRI showed septic arthritis with possible abscess and continued/worsening osteomyelitis. Ortho consulted and L BKA was done on 4/5. Continued LE edema on R leg. ECHO on 4/7 showed EF reduced to 35-40%, global hypokinesis, RV normal. Cardiology consulted. Possible CIR at discharge.   Current HF Medications: Diuretic: furosemide 40 mg PO daily Beta Blocker: carvedilol 3.125 mg BID  Prior to admission HF Medications: None  Pertinent Lab Values: Serum creatinine 1.14, BUN 6, Potassium 3.6, Sodium 136, A1c 10.3   Vital Signs: Weight: 374 lbs Blood pressure: 160/90s  Heart rate: 80s  I/O: incomplete  Medication Assistance / Insurance Benefits Check: Does the patient have prescription insurance?  Yes Type of insurance plan: out of state Medicaid  Outpatient Pharmacy:  Prior to admission [facility] Is the patient willing to use [facility] TOC pharmacy at discharge? Yes Is the patient willing to transition their outpatient pharmacy to utilize a [facility] outpatient pharmacy?   Pending    Assessment: 1. Acute systolic CHF (LVEF 35-40%). NYHA class III symptoms. - Continue furosemide 40 mg PO daily. Strict I/Os and daily weights. Keep K>4.  - Continue carvedilol 6.25 mg BID - Consider adding Entresto 24/26 mg BID  - Consider adding spironolactone 25 mg daily prior to discharge - Caution SGLT2i with A1c >10   Plan: 1) Medication changes recommended at this time: - Add Entresto 24/26 mg BID  2) Patient assistance: - Has out of state Medicaid - will ask case management to see where he is in  the process of getting [place] Medicaid  3)  Education  - To be completed prior to discharge  Alan Tapia, PharmD, BCPS Heart Failure Stewardship Pharmacist Phone [phone]

## 2022-08-18 NOTE — Plan of Care (Signed)

## 2022-08-18 NOTE — Progress Notes (Signed)
Awoke around 0200 drenched in sweat- check on glucose showed 91- provided a nepro and graham crackers with peanut butter

## 2022-08-19 DIAGNOSIS — I5021 Acute systolic (congestive) heart failure: Secondary | ICD-10-CM | POA: Diagnosis not present

## 2022-08-19 DIAGNOSIS — M86272 Subacute osteomyelitis, left ankle and foot: Secondary | ICD-10-CM | POA: Diagnosis not present

## 2022-08-19 DIAGNOSIS — I160 Hypertensive urgency: Secondary | ICD-10-CM | POA: Diagnosis not present

## 2022-08-19 DIAGNOSIS — E11628 Type 2 diabetes mellitus with other skin complications: Secondary | ICD-10-CM | POA: Diagnosis not present

## 2022-08-19 LAB — BASIC METABOLIC PANEL
Anion gap: 5 (ref 5–15)
BUN: <5 mg/dL — ABNORMAL LOW (ref 6–20)
CO2: 25 mmol/L (ref 22–32)
Calcium: 7.5 mg/dL — ABNORMAL LOW (ref 8.9–10.3)
Chloride: 106 mmol/L (ref 98–111)
Creatinine, Ser: 0.97 mg/dL (ref 0.61–1.24)
GFR, Estimated: >60 mL/min (ref >60)
Glucose, Bld: 171 mg/dL — ABNORMAL HIGH (ref 70–99)
Potassium: 3.3 mmol/L — ABNORMAL LOW (ref 3.5–5.1)
Sodium: 136 mmol/L (ref 135–145)

## 2022-08-19 LAB — GLUCOSE, CAPILLARY
Glucose-Capillary: 200 mg/dL — ABNORMAL HIGH (ref 70–99)
Glucose-Capillary: 156 mg/dL — ABNORMAL HIGH (ref 70–99)
Glucose-Capillary: 123 mg/dL — ABNORMAL HIGH (ref 70–99)
Glucose-Capillary: 162 mg/dL — ABNORMAL HIGH (ref 70–99)
Glucose-Capillary: 243 mg/dL — ABNORMAL HIGH (ref 70–99)
Glucose-Capillary: 211 mg/dL — ABNORMAL HIGH (ref 70–99)

## 2022-08-19 LAB — MAGNESIUM: Magnesium: 1.7 mg/dL (ref 1.7–2.4)

## 2022-08-19 MED ORDER — POTASSIUM CHLORIDE CRYS ER 20 MEQ PO TBCR
40.0000 meq | EXTENDED_RELEASE_TABLET | Freq: Once | ORAL | Status: AC
  Administered 2022-08-19: 40 meq via ORAL
  Filled 2022-08-19: qty 2

## 2022-08-19 MED ORDER — CARVEDILOL 12.5 MG PO TABS
12.5000 mg | ORAL_TABLET | Freq: Two times a day (BID) | ORAL | Status: DC
  Administered 2022-08-19 – 2022-08-20 (×3): 12.5 mg via ORAL
  Filled 2022-08-19 (×3): qty 1

## 2022-08-19 NOTE — Progress Notes (Signed)
Physical Therapy Treatment Patient Details Name: Alan Tapia. MRN: [id number] DOB: 1976-03-07 Today's Date: 08/19/2022   History of Present Illness Pt is a 47 y.o. male admitted 4/3 with 6 mo history of L foot ulcer; Presenting with swelling and progressive yellow discharge from the foot. 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 PMH significant for DMII, HTN.    PT Comments    Pt seen for PT tx with co-tx with OT for pt & therapists' safety. Pt pleasant & agreeable to tx. Pt is able to complete bed mobility with supervision but relies on hospital bed features. Pt engages in static standing with BUE support for RLE & overall strengthening x ~5 minutes. Pt then performs STS from elevated EOB but reducing height with cuing for hand placement, task focusing on strengthening during transitional movement, but pt using significant momentum to assist. Pt is able to complete lateral scoot at end of session to drop arm recliner with supervision but requires cuing for head/hips relationship. Discussed home set up with pt & wife & introduced possibility of pt requiring a ramp to access his home - wife reports they are already working on that (of note, pt has built up furniture with cinderblocks prior to admission & plans to get more).    Recommendations for follow up therapy are one component of a multi-disciplinary discharge planning process, led by the attending physician.  Recommendations may be updated based on patient status, additional functional criteria and insurance authorization.  Follow Up Recommendations  Can patient physically be transported by private vehicle: No    Assistance Recommended at Discharge Frequent or constant Supervision/Assistance  Patient can return home with the following Two people to help with walking and/or transfers;Two people to help with bathing/dressing/bathroom;Assistance with cooking/housework;Assist for transportation;Help with stairs or ramp for entrance   Equipment Recommendations   None recommended by PT (TBD in next venue)    Recommendations for Other Services Rehab consult     Precautions / Restrictions Precautions Precautions: Fall Required Braces or Orthoses:  (limb guard) Restrictions Weight Bearing Restrictions: Yes LLE Weight Bearing: Non weight bearing     Mobility  Bed Mobility Overal bed mobility: Needs Assistance Bed Mobility: Supine to Sit     Supine to sit: Supervision, HOB elevated     General bed mobility comments: extra time, use of bed rails & HOB elevated to come to sitting EOB    Transfers Overall transfer level: Needs assistance Equipment used: Rolling walker (2 wheels) Transfers: Sit to/from Stand Sit to Stand: Min assist          Lateral/Scoot Transfers: Supervision (Pt completes lateral scoot with supervision but leaning posteriorly & halfway through, elevates LLE onto bed. PT educates pt on importance of anterior weight shift to reduce risk of buttocks sliding off of edge of seat.) General transfer comment: STS from EOB with min assist +2 with PT reviewing safe hand placement during transfers. Pt performed STS from lowering heights for increased challenge.    Ambulation/Gait                   Stairs             Wheelchair Mobility    Modified Rankin (Stroke Patients Only)       Balance Overall balance assessment: Needs assistance Sitting-balance support: No upper extremity supported, Feet supported Sitting balance-Leahy Scale: Fair     Standing balance support: Bilateral upper extremity supported, Reliant on assistive device for balance Standing balance-Leahy Scale: Poor Standing balance  comment: Pt able to stand & remove 1 UE from RW but does have 1 LOB (when removing LUE vs RUE).                            Cognition Arousal/Alertness: Awake/alert Behavior During Therapy: WFL for tasks assessed/performed Overall Cognitive Status: Within Functional Limits for tasks assessed                                  General Comments: Pleasant, follows commands throughout session        Exercises      General Comments General comments (skin integrity, edema, etc.): OT assisted with limb guard positioning      Pertinent Vitals/Pain Pain Assessment Pain Assessment: Faces Faces Pain Scale: Hurts even more Pain Location: R knee Pain Descriptors / Indicators: Discomfort Pain Intervention(s): Monitored during session, Premedicated before session    Home Living                          Prior Function            PT Goals (current goals can now be found in the care plan section) Acute Rehab PT Goals Patient Stated Goal: get well PT Goal Formulation: With patient/family Time For Goal Achievement: 08/30/22 Potential to Achieve Goals: Good Progress towards PT goals: Progressing toward goals    Frequency    Min 4X/week      PT Plan Current plan remains appropriate    Co-evaluation PT/OT/SLP Co-Evaluation/Treatment: Yes Reason for Co-Treatment: For patient/therapist safety PT goals addressed during session: Mobility/safety with mobility;Balance;Proper use of DME        AM-PAC PT "6 Clicks" Mobility   Outcome Measure  Help needed turning from your back to your side while in a flat bed without using bedrails?: A Little Help needed moving from lying on your back to sitting on the side of a flat bed without using bedrails?: A Little Help needed moving to and from a bed to a chair (including a wheelchair)?: A Little Help needed standing up from a chair using your arms (e.g., wheelchair or bedside chair)?: Total Help needed to walk in hospital room?: Total Help needed climbing 3-5 steps with a railing? : Total 6 Click Score: 12    End of Session Equipment Utilized During Treatment: Gait belt Activity Tolerance: Patient tolerated treatment well Patient left: in chair;with call bell/phone within reach;with family/visitor present (in  care of OT)   PT Visit Diagnosis: Unsteadiness on feet (R26.81);Muscle weakness (generalized) (M62.81);Difficulty in walking, not elsewhere classified (R26.2);Other abnormalities of gait and mobility (R26.89)     Time: 4259-5638 PT Time Calculation (min) (ACUTE ONLY): 24 min  Charges:  $Therapeutic Activity: 8-22 mins                     Aleda Grana, PT, DPT 08/19/22, 3:01 PM   Sandi Mariscal 08/19/2022, 2:59 PM

## 2022-08-19 NOTE — Progress Notes (Signed)
Occupational Therapy Treatment Patient Details Name: Alan Tapia. MRN: [id number] DOB: [birth year] Today's Date: 08/19/2022   History of present illness Pt is a 47 y.o. male admitted 4/3 with 6 mo history of L foot ulcer; Presenting with swelling and progressive yellow discharge from the foot. 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 PMH significant for DMII, HTN.   OT comments  Pt motivated to participate in therapy, progressing towards goals, requires less assistance with sit to stand and able to transfer to chair with lateral scooting with supervision. Pt educated on BUE exercises with use of green theraband, good technique and carryover of learned exercises. Pt continues to require significant assistance with performing ambulation and standing pivot transfers, discharge remains appropriate, would greatly benefit from skilled OT follow up post acute.   Recommendations for follow up therapy are one component of a multi-disciplinary discharge planning process, led by the attending physician.  Recommendations may be updated based on patient status, additional functional criteria and insurance authorization.    Assistance Recommended at Discharge Frequent or constant Supervision/Assistance  Patient can return home with the following  Two people to help with walking and/or transfers;Two people to help with bathing/dressing/bathroom;Assistance with cooking/housework;Assist for transportation;Help with stairs or ramp for entrance   Equipment Recommendations  Other (comment) (Pt in process of getting ramp to assist with getting into home)    Recommendations for Other Services      Precautions / Restrictions Precautions Precautions: Fall Required Braces or Orthoses: Other Brace Other Brace: LLE brace Restrictions Weight Bearing Restrictions: Yes LLE Weight Bearing: Non weight bearing       Mobility Bed Mobility Overal bed mobility: Needs Assistance Bed Mobility: Supine to Sit     Supine to sit:  Supervision, HOB elevated          Transfers Overall transfer level: Needs assistance   Transfers: Bed to chair/wheelchair/BSC Sit to Stand: Min assist          Lateral/Scoot Transfers: Supervision General transfer comment: Pt min Ax2 for sit to stand, Pt has LOB for each stand until he finds his balance     Balance Overall balance assessment: Needs assistance Sitting-balance support: No upper extremity supported, Feet supported Sitting balance-Leahy Scale: Good     Standing balance support: Single extremity supported, During functional activity, Reliant on assistive device for balance Standing balance-Leahy Scale: Poor Standing balance comment: able to stand with single UE supported, LOBx1 when reaching with RUE                           ADL either performed or assessed with clinical judgement   ADL Overall ADL's : Needs assistance/impaired Eating/Feeding: Modified independent;Sitting   Grooming: Set up;Sitting   Upper Body Bathing: Set up;Sitting       Upper Body Dressing : Set up;Sitting   Lower Body Dressing: Total assistance;+2 for physical assistance;Sit to/from stand                      Extremity/Trunk Assessment Upper Extremity Assessment Upper Extremity Assessment: Overall WFL for tasks assessed   Lower Extremity Assessment Lower Extremity Assessment: Defer to PT evaluation        Vision       Perception     Praxis      Cognition Arousal/Alertness: Awake/alert Behavior During Therapy: WFL for tasks assessed/performed Overall Cognitive Status: Within Functional Limits for tasks assessed  Exercises Exercises: General Upper Extremity General Exercises - Upper Extremity Shoulder Flexion: AROM, Strengthening, 20 reps, Theraband Theraband Level (Shoulder Flexion): Level 3 (Green) Elbow Flexion: AROM, Strengthening, 20 reps, Theraband Theraband Level (Elbow  Flexion): Level 3 (Green) Elbow Extension: AROM, Strengthening, 20 reps, Theraband Theraband Level (Elbow Extension): Level 3 (Green)    Shoulder Instructions       General Comments assisted with LLE limb guard    Pertinent Vitals/ Pain       Pain Assessment Pain Assessment: 0-10 Pain Score: 6  Faces Pain Scale: Hurts even more Pain Location: R knee Pain Descriptors / Indicators: Discomfort Pain Intervention(s): Monitored during session, Premedicated before session  Home Living                                          Prior Functioning/Environment              Frequency  Min 2X/week        Progress Toward Goals  OT Goals(current goals can now be found in the care plan section)  Progress towards OT goals: Progressing toward goals  Acute Rehab OT Goals Patient Stated Goal: to improve RLE strength OT Goal Formulation: With patient Time For Goal Achievement: 08/30/22 Potential to Achieve Goals: Good ADL Goals Pt Will Perform Lower Body Dressing: with min guard assist;sit to/from stand Pt Will Transfer to Toilet: with supervision;with transfer board;squat pivot transfer Additional ADL Goal #1: Pt will verbalize desensitization techniques for LLE  Plan Discharge plan remains appropriate    Co-evaluation    PT/OT/SLP Co-Evaluation/Treatment: Yes Reason for Co-Treatment: For patient/therapist safety PT goals addressed during session: Mobility/safety with mobility;Balance;Proper use of DME OT goals addressed during session: Strengthening/ROM;ADL's and self-care;Proper use of Adaptive equipment and DME      AM-PAC OT "6 Clicks" Daily Activity     Outcome Measure   Help from another person eating meals?: None Help from another person taking care of personal grooming?: A Little Help from another person toileting, which includes using toliet, bedpan, or urinal?: A Lot Help from another person bathing (including washing, rinsing, drying)?: A  Lot Help from another person to put on and taking off regular upper body clothing?: A Little Help from another person to put on and taking off regular lower body clothing?: Total 6 Click Score: 15    End of Session Equipment Utilized During Treatment: Gait belt;Rolling walker (2 wheels)  OT Visit Diagnosis: Unsteadiness on feet (R26.81);Other abnormalities of gait and mobility (R26.89);Muscle weakness (generalized) (M62.81);Pain Pain - Right/Left: Left Pain - part of body: Leg   Activity Tolerance Patient tolerated treatment well   Patient Left in chair;with call bell/phone within reach;with family/visitor present   Nurse Communication Mobility status        Time: 1430-1500 OT Time Calculation (min): 30 min  Charges: OT General Charges $OT Visit: 1 Visit OT Treatments $Therapeutic Activity: 8-22 mins  Macaela Presas, OTR/L   Alexis Goodell 08/19/2022, 3:14 PM

## 2022-08-19 NOTE — Progress Notes (Signed)
Patient ID: Alan Tapia., male   DOB: [birth year], 47 y.o.   MRN: [id number] Patient is a 47 year old gentleman who is status post left transtibial amputation.  There is no additional drainage in the wound VAC canister.  Patient is anticipating discharge to inpatient rehab.

## 2022-08-19 NOTE — Progress Notes (Signed)
Inpatient Rehabilitation Admissions Coordinator   I met with patient and his wife at bedside. I await Medicaid of [name] approval for possible CIR admit even though we are out of network. They are also applying for [place] medicaid and have some documentation that they need to have hospital provide to pursue. I have notified TOC RN CM and SW of need to follow up with patient and his wife.  Ottie Glazier, RN, MSN Rehab Admissions Coordinator [phone] 08/19/2022 11:16 AM

## 2022-08-19 NOTE — Progress Notes (Signed)
Spoke w patient and wife at bedside. Wife gave me a number for [name] at [facility], but she is the family/ peds Medicaid coordinator. Pam gave me another person to try. I called and spoke with [name] [phone]. She said there is not a form pending/ requested. [place] needs to terminate his coverage to make him eligible for [place] coverage which he would be the month after [name] terminates it, then he would have to apply for disability and get assessed etc before he was approved for [facility] medicaid.  Financial counselor notified.

## 2022-08-19 NOTE — Progress Notes (Signed)
   [name]   PCP: Patient, No Pcp Per PCP-Cardiologist: None    HPI:  47 yo M with PMH of chronic osteomyelitis, T2DM, and HTN.   Presented to the ED on 4/3 with L foot infection. Reports he was being treated for osteomyelitis in [name] but PICC has fallen out and he was unable to get vancomycin for 4 weeks. Recently moved to [facility]. In the ED, MRI showed septic arthritis with possible abscess and continued/worsening osteomyelitis. Ortho consulted and L BKA was done on 4/5. Continued LE edema on R leg. ECHO on 4/7 showed EF reduced to 35-40%, global hypokinesis, RV normal. Cardiology consulted. Possible CIR at discharge.   Current HF Medications: Diuretic: furosemide 40 mg PO daily Beta Blocker: carvedilol 12.5 mg BID ACE/ARB/ARNI: Entresto 24/26 mg BID  Prior to admission HF Medications: None  Pertinent Lab Values: As of 4/7: Serum creatinine 1.14, BUN 6, Potassium 3.6, Sodium 136, A1c 10.3   Vital Signs: Weight: 374 lbs Blood pressure: 150/80s  Heart rate: 70-80s  I/O: incomplete  Medication Assistance / Insurance Benefits Check: Does the patient have prescription insurance?  Yes Type of insurance plan: out of state Medicaid > BCBS commercial plan starting 5/1  Outpatient Pharmacy:  Prior to admission [facility] Is the patient willing to use [facility] TOC pharmacy at discharge? Yes Is the patient willing to transition their outpatient pharmacy to utilize a [facility] outpatient pharmacy?   Pending    Assessment: 1. Acute systolic CHF (LVEF 35-40%). NYHA class II symptoms. - Continue furosemide 40 mg PO daily. Strict I/Os and daily weights. Keep K>4.  - Agree with increasing carvedilol 12.5 mg BID - Continue Entresto 24/26 mg BID  - Consider adding spironolactone 25 mg daily prior to discharge, will need to agree to labs before initiating - Caution SGLT2i with A1c >10   Plan: 1) Medication changes recommended at this  time: - Agree with changes - Start spironolactone if renal function stable   2) Patient assistance: - Has out of state Medicaid  - Case management discussed with patient and his wife - he has a Insurance underwriter starting on 5/1  3)  Education  - To be completed prior to discharge  Sharen Hones, PharmD, BCPS Heart Failure Engineer, building services Phone [phone]

## 2022-08-19 NOTE — Progress Notes (Signed)
PROGRESS NOTE  Alan GinsbergEdward Lockyer Jr.  DOB: 05/31/1975  PCP: Patient, No Pcp Per ZOX:[id number]  DOA: 08/13/2022  LOS: 6 days  Hospital Day: 7  Brief narrative: Alan Ginsbergdward Oregel Jr. is a 47 y.o. male with PMH significant for morbid obesity, uncontrolled DM, HTN who has a 3339-month history of a left foot ulcer for which she was seeing a podiatrist weekly for de[place] at [place], last debridement 06/10/2022. [place] recently [place] due to a family emergency but he was traveling back-and-forth to see the same podiatrist in [place].  He also had a PICC line and was receiving vancomycin infusions.  PICC line fell out before completion of the antibiotic course for which he did not follow-up.   4/3, presented to ED with complaint of progressive swelling, and clear yellow discharge from the foot.  In the ED, patient was afebrile, hemodynamically stable and breathing on room air. MRI left foot showed large plantar foot wound with possible septic arthritis, abscess and worsening osteomyelitis.   EDP discussed with orthopedics 4/5, underwent left BKA by Dr. Lajoyce Cornersuda  Subjective: Patient was seen and examined this morning.   Lying on bed.  Not in distress no new symptoms. Pending CIR.  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 Lajoyce Cornersuda Wound VAC in place. Initially covered on broad-spectrum IV antibiotics.  Per Dr. Audrie Liauda's note, infection was clear with amputation and hence antibiotics was stopped after 24 hours of surgery. Currently on pain control with scheduled Tylenol, scheduled oxycodone and as needed Dilaudid.  Per patient's preference, he is also on oral Motrin.  New diagnosis of systolic CHF Essential hypertension Patient had bilateral lower EXTR lymphedema.  Now with left amputation status continues to have lymphedema on the right. 4/7, echocardiogram obtained which showed a new low EF  of 35 to 40% with global hypokinesis. Cardiology consultation obtained. Coreg increased to 12.5 mg twice daily by cardiology today. Continue Lasix 40 mg daily and Entresto 24/26 mg twice daily added today. Per cardiology, repeat echo in 4 to 6 weeks.  Ischemia evaluation may be needed if EF remains down. Net IO Since Admission: 5,470.86 mL [08/19/22 1348] Continue to monitor for daily intake output, weight, blood pressure, BNP, renal function and electrolytes. Recent Labs  Lab 08/14/22 0500 08/15/22 0309 08/16/22 0215 08/17/22 0323 08/19/22 1033  BUN 9 6 7 6  <5*  CREATININE 1.16 1.18 1.11 1.14 0.97  K 3.1* 3.5 3.4* 3.6 3.3*     Acute on chronic anemia Baseline hemoglobin between 9 and 10.  Postop drop noted. Hemoglobin remained stable between 8 and 9 for the last 4 to.  Continue to monitor.  Transfuse if less than 7. Recent Labs    08/13/22 1500 08/14/22 0500 08/15/22 0309 08/16/22 0215 08/17/22 0323  HGB 9.8* 8.7* 8.7* 8.1* 8.0*  MCV 74.7* 71.8* 74.6* 74.2* 73.9*    Type 2 diabetes mellitus uncontrolled with hyperglycemia A1c 12.3 in January 2024.  Repeat A1c on 4/3 is better at 10.3. PTA on on Lantus and Humalog Currently blood sugar level is controlled on Lantus 10 units daily and sliding scale insulin with Accu-Cheks.  Recent Labs  Lab 08/18/22 2041 08/18/22 2149 08/19/22 0301 08/19/22 0736 08/19/22 1147  GLUCAP 166* 200* 156* 123* 162*    Morbid Obesity  Body mass index is 45.52 kg/m. Patient has been advised to make an attempt to improve diet and exercise patterns to aid in weight  loss.  Mobility: PT eval obtained.  CIR recommended  Goals of care   Code Status: Full Code     DVT prophylaxis: Lovenox   Antimicrobials: Not on antibiotics Fluid: IV fluid stopped Consultants: Orthopedics, cardiology Family Communication: Wife at bedside  Status: Inpatient Level of care:  Telemetry Medical   Needs to continue in-hospital care:  New diagnosis of  CHF.  Pending CIR approval  Patient from: Home Anticipated d/c to: CIR      Diet:  Diet Order             Diet Carb Modified Fluid consistency: Thin; Room service appropriate? Yes  Diet effective now                   Scheduled Meds:  acetaminophen  1,000 mg Oral TID   vitamin C  1,000 mg Oral Daily   carvedilol  12.5 mg Oral BID WC   docusate sodium  100 mg Oral Daily   enoxaparin (LOVENOX) injection  80 mg Subcutaneous Q24H   furosemide  40 mg Oral Daily   ibuprofen  800 mg Oral TID   insulin aspart  0-5 Units Subcutaneous QHS   insulin aspart  0-9 Units Subcutaneous TID WC   insulin glargine-yfgn  10 Units Subcutaneous Daily   multivitamin with minerals  1 tablet Oral Daily   pantoprazole  40 mg Oral Daily   potassium chloride  40 mEq Oral Once   sacubitril-valsartan  1 tablet Oral BID   zinc sulfate  220 mg Oral Daily    PRN meds: alum & mag hydroxide-simeth, bisacodyl, guaiFENesin-dextromethorphan, hydrALAZINE, hydrALAZINE, hydrocortisone cream, HYDROmorphone (DILAUDID) injection, labetalol, LORazepam, LORazepam, magnesium citrate, magnesium sulfate bolus IVPB, metoprolol tartrate, ondansetron **OR** ondansetron (ZOFRAN) IV, phenol, polyethylene glycol   Infusions:   magnesium sulfate bolus IVPB      Antimicrobials: Anti-infectives (From admission, onward)    Start     Dose/Rate Route Frequency Ordered Stop   08/15/22 1225  ceFAZolin (ANCEF) 2-4 GM/100ML-% IVPB  Status:  Discontinued       Note to Pharmacy: Gleason, Ginger E: cabinet override      08/15/22 1225 08/15/22 1246   08/15/22 1156  ceFAZolin (ANCEF) 3-0.9 GM/100ML-% IVPB       Note to Pharmacy: Phebe Colla N: cabinet override      08/15/22 1156 08/15/22 2359   08/14/22 1300  ceFAZolin (ANCEF) IVPB 3g/100 mL premix  Status:  Discontinued        3 g 200 mL/hr over 30 Minutes Intravenous On call to O.R. 08/14/22 1128 08/15/22 0559   08/14/22 0215  linezolid (ZYVOX) IVPB 600 mg  Status:   Discontinued       Note to Pharmacy: Patient not tolerating vanc IV even at lower rate, changing to zyvox. Patient denies history of past zyvox reaction. First vanc dose was not fully given.   600 mg 300 mL/hr over 60 Minutes Intravenous Every 12 hours 08/14/22 0124 08/16/22 1120   08/14/22 0200  vancomycin (VANCOREADY) IVPB 2000 mg/400 mL  Status:  Discontinued        2,000 mg 100 mL/hr over 240 Minutes Intravenous Every 12 hours 08/13/22 1750 08/14/22 0225   08/13/22 2200  cefTRIAXone (ROCEPHIN) 2 g in sodium chloride 0.9 % 100 mL IVPB  Status:  Discontinued        2 g 200 mL/hr over 30 Minutes Intravenous Every 24 hours 08/13/22 1646 08/17/22 1202   08/13/22 1345  vancomycin (VANCOREADY) IVPB 2000 mg/400 mL  2,000 mg 200 mL/hr over 120 Minutes Intravenous  Once 08/13/22 1311 08/13/22 1626   08/13/22 1315  metroNIDAZOLE (FLAGYL) IVPB 500 mg  Status:  Discontinued        500 mg 100 mL/hr over 60 Minutes Intravenous Every 12 hours 08/13/22 1307 08/17/22 1202   08/13/22 1315  ceFEPIme (MAXIPIME) 2 g in sodium chloride 0.9 % 100 mL IVPB        2 g 200 mL/hr over 30 Minutes Intravenous  Once 08/13/22 1311 08/13/22 1406       Nutritional status:  Body mass index is 45.52 kg/m.  Nutrition Problem: Increased nutrient needs Etiology: wound healing, post-op healing Signs/Symptoms: estimated needs     Objective: Vitals:   08/18/22 2148 08/19/22 0740  BP: (!) 152/72 (!) 151/82  Pulse: 85 80  Resp: 17 18  Temp: 97.6 F (36.4 C) 98.5 F (36.9 C)  SpO2: 98% 99%    Intake/Output Summary (Last 24 hours) at 08/19/2022 1348 Last data filed at 08/19/2022 1000 Gross per 24 hour  Intake 480 ml  Output 50 ml  Net 430 ml    Filed Weights   08/13/22 1206 08/15/22 1210  Weight: (!) 169.6 kg (!) 169.6 kg   Weight change:  Body mass index is 45.52 kg/m.   Physical Exam: General exam: Pleasant, middle-aged African-American male. Skin: No rashes, lesions or ulcers. HEENT:  Atraumatic, normocephalic, no obvious bleeding Lungs: Clear to auscultation bilaterally. CVS: Regular rate and rhythm, no murmur GI/Abd soft, nontender, nondistended, bowel sound present CNS: Alert, awake, oriented x 3 Psychiatry: Mood appropriate Extremities: Gradually improving right lower extremity edema.  Left lower extremity BKA status.  On boot and wound VAC  Data Review: I have personally reviewed the laboratory data and studies available.  F/u labs ordered Unresulted Labs (From admission, onward)     Start     Ordered   08/20/22 0500  Creatinine, serum  (enoxaparin (LOVENOX)    CrCl >/= 30 ml/min)  Weekly,   R     Comments: while on enoxaparin therapy    08/13/22 1846   08/20/22 0500  CBC  Once,   R        08/18/22 1301   08/19/22 1340  Magnesium  Add-on,   AD        08/19/22 1339            Total time spent in review of labs and imaging, patient evaluation, formulation of plan, documentation and communication with family: 45 minutes  Signed, Lorin Glass, MD [facility] 08/19/2022

## 2022-08-19 NOTE — Plan of Care (Signed)
  Problem: Education: Goal: Ability to describe self-care measures that may prevent or decrease complications (Diabetes Survival Skills Education) will improve Outcome: Progressing   Problem: Coping: Goal: Ability to adjust to condition or change in health will improve Outcome: Progressing   Problem: Health Behavior/Discharge Planning: Goal: Ability to manage health-related needs will improve Outcome: Progressing   Problem: Metabolic: Goal: Ability to maintain appropriate glucose levels will improve Outcome: Progressing   Problem: Education: Goal: Knowledge of General Education information will improve Description: Including pain rating scale, medication(s)/side effects and non-pharmacologic comfort measures Outcome: Progressing   Problem: Health Behavior/Discharge Planning: Goal: Ability to manage health-related needs will improve Outcome: Progressing

## 2022-08-19 NOTE — Progress Notes (Addendum)
Rounding Note    Patient Name: Alan Tapia. Date of Encounter: 08/19/2022  [facility] Cardiologist: New to Dr Shari Prows  Subjective   He feels well, he did not want any blood work and states he will think about it. He denied any intolerance to [facility].   Inpatient Medications    Scheduled Meds:  acetaminophen  1,000 mg Oral TID   vitamin C  1,000 mg Oral Daily   carvedilol  12.5 mg Oral BID WC   docusate sodium  100 mg Oral Daily   enoxaparin (LOVENOX) injection  80 mg Subcutaneous Q24H   furosemide  40 mg Oral Daily   ibuprofen  800 mg Oral TID   insulin aspart  0-5 Units Subcutaneous QHS   insulin aspart  0-9 Units Subcutaneous TID WC   insulin glargine-yfgn  10 Units Subcutaneous Daily   multivitamin with minerals  1 tablet Oral Daily   oxyCODONE  5 mg Oral Q6H   pantoprazole  40 mg Oral Daily   sacubitril-valsartan  1 tablet Oral BID   zinc sulfate  220 mg Oral Daily   Continuous Infusions:  magnesium sulfate bolus IVPB     PRN Meds: alum & mag hydroxide-simeth, bisacodyl, guaiFENesin-dextromethorphan, hydrALAZINE, hydrALAZINE, hydrocortisone cream, HYDROmorphone (DILAUDID) injection, labetalol, LORazepam, LORazepam, magnesium citrate, magnesium sulfate bolus IVPB, metoprolol tartrate, ondansetron **OR** ondansetron (ZOFRAN) IV, phenol, polyethylene glycol   Vital Signs    Vitals:   08/18/22 0752 08/18/22 1435 08/18/22 2148 08/19/22 0740  BP: (!) 161/95 (!) 170/90 (!) 152/72 (!) 151/82  Pulse: 86 89 85 80  Resp: 18 18 17 18   Temp: 97.6 F (36.4 C) 97.8 F (36.6 C) 97.6 F (36.4 C) 98.5 F (36.9 C)  TempSrc: Oral Oral  Oral  SpO2: 100% 99% 98% 99%  Weight:      Height:        Intake/Output Summary (Last 24 hours) at 08/19/2022 0922 Last data filed at 08/19/2022 0300 Gross per 24 hour  Intake 240 ml  Output 50 ml  Net 190 ml      08/15/2022   12:10 PM 08/13/2022   12:06 PM 06/06/2022    1:30 PM  Last 3 Weights  Weight (lbs) 374 lb 374  lb 375 lb  Weight (kg) 169.645 kg 169.645 kg 170.099 kg      Telemetry    Sinus rhythm  - Personally Reviewed  ECG    EKG from 08/18/22 sinus rhythm  - Personally Reviewed  Physical Exam   GEN: No acute distress. Obese   Neck: No JVD Cardiac: RRR, no murmurs, rubs, or gallops.  Respiratory: Clear to auscultation bilaterally. On room air. Speaks full sentence  GI: Soft, nontender, non-distended  MS: s/p left BKA, immobilizer in place /not removed for exam; RLE 1+ edema  Neuro:  Nonfocal  Psych: Normal affect   Labs    High Sensitivity Troponin:  No results for input(s): "TROPONINIHS" in the last 720 hours.   Chemistry Recent Labs  Lab 08/15/22 0309 08/16/22 0215 08/17/22 0323  NA 134* 132* 136  K 3.5 3.4* 3.6  CL 104 101 103  CO2 24 23 25   GLUCOSE 130* 247* 142*  BUN 6 7 6   CREATININE 1.18 1.11 1.14  CALCIUM 7.5* 7.4* 7.6*  GFRNONAA >60 >60 >60  ANIONGAP 6 8 8     Lipids No results for input(s): "CHOL", "TRIG", "HDL", "LABVLDL", "LDLCALC", "CHOLHDL" in the last 168 hours.  Hematology Recent Labs  Lab 08/15/22 0309 08/16/22 0215  08/17/22 0323  WBC 9.3 8.6 8.1  RBC 3.90* 3.56* 3.49*  HGB 8.7* 8.1* 8.0*  HCT 29.1* 26.4* 25.8*  MCV 74.6* 74.2* 73.9*  MCH 22.3* 22.8* 22.9*  MCHC 29.9* 30.7 31.0  RDW 15.4 15.2 15.6*  PLT 388 367 397   Thyroid No results for input(s): "TSH", "FREET4" in the last 168 hours.  BNPNo results for input(s): "BNP", "PROBNP" in the last 168 hours.  DDimer No results for input(s): "DDIMER" in the last 168 hours.   Radiology    ECHOCARDIOGRAM COMPLETE  Result Date: 08/17/2022    ECHOCARDIOGRAM REPORT   Patient Name:   Alan Tapia. Date of Exam: 08/17/2022 Medical Rec #:  [id number]          Height:       76.0 in Accession #:    [id number]         Weight:       374.0 lb Date of Birth:  [birth year]          BSA:          2.890 m Patient Age:    47 years           BP:           169/93 mmHg Patient Gender: M                  HR:            86 bpm. Exam Location:  Inpatient Procedure: 2D Echo, Color Doppler and Cardiac Doppler Indications:    R94.31 Abnormal EKG  History:        Patient has no prior history of Echocardiogram examinations.                 Risk Factors:Hypertension and Diabetes.  Sonographer:    Irving Burton Senior RDCS Referring Phys: Lorin Glass  Sonographer Comments: Suboptimal apical window due to patient body habitus. IMPRESSIONS  1. Left ventricular ejection fraction, by estimation, is 35 to 40%. The left ventricle has moderately decreased function. The left ventricle demonstrates global hypokinesis. The left ventricular internal cavity size was mildly dilated. Indeterminate diastolic filling due to E-A fusion.  2. Right ventricular systolic function is normal. The right ventricular size is normal. Tricuspid regurgitation signal is inadequate for assessing PA pressure.  3. Left atrial size was mildly dilated.  4. The mitral valve is grossly normal. Mild mitral valve regurgitation.  5. The aortic valve is tricuspid. Aortic valve regurgitation is not visualized.  6. The inferior vena cava is dilated in size with <50% respiratory variability, suggesting right atrial pressure of 15 mmHg. FINDINGS  Left Ventricle: Left ventricular ejection fraction, by estimation, is 35 to 40%. The left ventricle has moderately decreased function. The left ventricle demonstrates global hypokinesis. The left ventricular internal cavity size was mildly dilated. There is no left ventricular hypertrophy. Indeterminate diastolic filling due to E-A fusion. Indeterminate filling pressures. Right Ventricle: The right ventricular size is normal. No increase in right ventricular wall thickness. Right ventricular systolic function is normal. Tricuspid regurgitation signal is inadequate for assessing PA pressure. Left Atrium: Left atrial size was mildly dilated. Right Atrium: Right atrial size was normal in size. Pericardium: There is no evidence of pericardial effusion.  Mitral Valve: The mitral valve is grossly normal. Mild mitral valve regurgitation. Tricuspid Valve: The tricuspid valve is grossly normal. Tricuspid valve regurgitation is trivial. Aortic Valve: The aortic valve is tricuspid. Aortic valve regurgitation is not visualized. Pulmonic Valve: The pulmonic valve  was grossly normal. Pulmonic valve regurgitation is not visualized. Aorta: The aortic root and ascending aorta are structurally normal, with no evidence of dilitation. Venous: The inferior vena cava is dilated in size with less than 50% respiratory variability, suggesting right atrial pressure of 15 mmHg. IAS/Shunts: No atrial level shunt detected by color flow Doppler.  LEFT VENTRICLE PLAX 2D LVIDd:         5.80 cm      Diastology LVIDs:         4.70 cm      LV e' medial:    6.53 cm/s LV PW:         1.00 cm      LV E/e' medial:  12.7 LV IVS:        0.90 cm      LV e' lateral:   6.42 cm/s LVOT diam:     2.30 cm      LV E/e' lateral: 13.0 LV SV:         78 LV SV Index:   27 LVOT Area:     4.15 cm  LV Volumes (MOD) LV vol d, MOD A2C: 169.0 ml LV vol d, MOD A4C: 182.0 ml LV vol s, MOD A2C: 107.0 ml LV vol s, MOD A4C: 114.0 ml LV SV MOD A2C:     62.0 ml LV SV MOD A4C:     182.0 ml LV SV MOD BP:      62.7 ml RIGHT VENTRICLE RV S prime:     15.00 cm/s TAPSE (M-mode): 2.4 cm LEFT ATRIUM           Index        RIGHT ATRIUM           Index LA diam:      4.20 cm 1.45 cm/m   RA Area:     12.40 cm LA Vol (A2C): 69.9 ml 24.18 ml/m  RA Volume:   29.70 ml  10.28 ml/m LA Vol (A4C): 83.6 ml 28.94 ml/m  AORTIC VALVE LVOT Vmax:   95.10 cm/s LVOT Vmean:  67.000 cm/s LVOT VTI:    0.188 m  AORTA Ao Root diam: 3.00 cm Ao Asc diam:  2.90 cm MITRAL VALVE MV Area (PHT): 3.34 cm    SHUNTS MV Decel Time: 227 msec    Systemic VTI:  0.19 m MV E velocity: 83.20 cm/s  Systemic Diam: 2.30 cm Rachelle HoraMihai Croitoru MD Electronically signed by Thurmon FairMihai Croitoru MD Signature Date/Time: 08/17/2022/11:18:29 AM    Final     Cardiac Studies   Echo from  08/17/22:     1. Left ventricular ejection fraction, by estimation, is 35 to 40%. The  left ventricle has moderately decreased function. The left ventricle  demonstrates global hypokinesis. The left ventricular internal cavity size  was mildly dilated. Indeterminate  diastolic filling due to E-A fusion.   2. Right ventricular systolic function is normal. The right ventricular  size is normal. Tricuspid regurgitation signal is inadequate for assessing  PA pressure.   3. Left atrial size was mildly dilated.   4. The mitral valve is grossly normal. Mild mitral valve regurgitation.   5. The aortic valve is tricuspid. Aortic valve regurgitation is not  visualized.   6. The inferior vena cava is dilated in size with <50% respiratory  variability, suggesting right atrial pressure of 15 mmHg.     Patient Profile     47 y.o. male with a hx of morbid obesity, uncontrolled DM, uncontrolled HTN, chronic  left foot ulcer, chronic anemia,  medical non-compliance, presented with worsening left lower extremity ulcer swelling and draining found to have osteomyelitis/septic arthritis and ultimately underwent BKA. TTE this admission that was obtained for LE edema showed EF 35-40% global hypokinesis, mild LV dilation, indeterminate diastolic parameters, normal RV, mild LAE, mild MR. Cardiology is now consulted for newly diagnosed systolic HF.   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  - Etiology unclear, ischemic CM can't be ruled out as he reports intermittent chest  pain/SOB with exertion when his left foot infection /pain is worse and he has risk factors; stress induced CM is possible with current infection; long standing history of uncontrolled HTN is another differential  - Would repeat Echo in 4-6 weeks, allow time to recover from current infection/surgery, if EF remains down and he continue to have possible angina symptoms, consider ischemic evaluation with gated CT - Clinically euvolemic , historically may be having symptoms of CHF but difficult to differentiate from uncontrolled left foot pain/infection, lasix 40mg  daily is OK, track weight and I&Os - GDMT: will increase Coreg to 12.5mg  BID today, added Entresto 24/26mg  BID, BP remains high 150-170s systolic, check BMP today (he states he will think about it and does not want blood work),  may add spironolactone if renal index stable, consider SGLT2I once DM is better controlled    HTN - BP remains high 150-170 systolic currently  - on 2 agents PTA, does not know name  - increased coreg to 12.5mg  BID today, continue lasix, Entresto   Chronic left foot diabetic ulcer with acute on chronic osteomyelitis s/p left BKA  - blood culture from admission remains negative  - no obvious vegetation  noted on TTE, no date on previous microbiology [name], consider TEE if there is concern of endocarditis/defer to primary team    Uncontrolled type 2 DM Obesity  - per primary team      For questions or updates, please contact [facility] Please consult [email or web address] for contact info under        Signed, Cyndi Bender, NP  08/19/2022, 9:22 AM    Patient seen and examined and agree with Cyndi Bender, NP as detailed above.   In brief, the patient is a 47 year old male with history of poorly controlled DMII, uncontrolled HTN, lymphedema, chronic left foot diabetic ulcer, morbid obesity and medication noncompliance who presented with worsening left lower extremity ulcer swelling and draining found to have  osteomyelitis/septic arthritis and ultimately underwent BKA. TTE this admission that was obtained for LE edema showed EF 35-40% global hypokinesis, mild LV dilation, indeterminate diastolic parameters, normal RV, mild LAE, mild MR. Cardiology is now consulted for newly diagnosed systolic HF.  Per review of record, drop in EF is new as TTE from OSH with EF 50-55%. Unknown etiology. Denies anginal symptoms, however, has several risk factors for CAD. May also be related to poorly controlled HTN vs stress induced in the setting of sepsis. Will continue to optimize GDMT as tolerated with planned for repeat TTE to reassess EF as outpatient once clinically  improved. If EF remains depressed, will need ischemic work-up at that time.   Currently, the patient feels okay. States he is a very hard stick and is worried about getting labs. Discussed with him that it is very important for [place] to check his labs to be able to start/titrate his meds. He is amenable to get BMET drawn with phlebotomy and call IV nurse team if ultrasound guidance needed.    GEN: No acute distress.   Neck: No JVD Cardiac: RRR, no murmurs Respiratory: Clear to auscultation bilaterally. GI: Obese, soft, NTTP MS: Left BKA, 2+ RLE edema (warm) Neuro:  Nonfocal  Psych: Normal affect     Plan: -Will optimize GDMT as able as inpatient with plans for ischemic work-up as outpatient; may consider repeat limited TTE to reassess EF once recovered from acute illness and if persistently low, will need cath -Continue entresto 24/26mg  BID -Increase coreg to 12.5mg  BID -Add spiro tomorrow if renal function stable>>told patient he needs labs today and he is amenable to try -Will not add SGLT2i for now; plan to add once DM better controlled -Management of DM per primary team     Laurance Flatten, MD

## 2022-08-20 ENCOUNTER — Encounter (HOSPITAL_COMMUNITY): Payer: Self-pay | Admitting: Internal Medicine

## 2022-08-20 DIAGNOSIS — M86272 Subacute osteomyelitis, left ankle and foot: Secondary | ICD-10-CM | POA: Diagnosis not present

## 2022-08-20 DIAGNOSIS — I5021 Acute systolic (congestive) heart failure: Secondary | ICD-10-CM | POA: Diagnosis not present

## 2022-08-20 DIAGNOSIS — E11628 Type 2 diabetes mellitus with other skin complications: Secondary | ICD-10-CM | POA: Diagnosis not present

## 2022-08-20 DIAGNOSIS — I160 Hypertensive urgency: Secondary | ICD-10-CM | POA: Diagnosis not present

## 2022-08-20 LAB — CBC
HCT: 27.5 % — ABNORMAL LOW (ref 39.0–52.0)
Hemoglobin: 8.3 g/dL — ABNORMAL LOW (ref 13.0–17.0)
MCH: 22.8 pg — ABNORMAL LOW (ref 26.0–34.0)
MCHC: 30.2 g/dL (ref 30.0–36.0)
MCV: 75.5 fL — ABNORMAL LOW (ref 80.0–100.0)
Platelets: 423 10*3/uL — ABNORMAL HIGH (ref 150–400)
RBC: 3.64 MIL/uL — ABNORMAL LOW (ref 4.22–5.81)
RDW: 16.8 % — ABNORMAL HIGH (ref 11.5–15.5)
WBC: 6.6 10*3/uL (ref 4.0–10.5)
nRBC: 0 % (ref 0.0–0.2)

## 2022-08-20 LAB — GLUCOSE, CAPILLARY
Glucose-Capillary: 173 mg/dL — ABNORMAL HIGH (ref 70–99)
Glucose-Capillary: 134 mg/dL — ABNORMAL HIGH (ref 70–99)
Glucose-Capillary: 160 mg/dL — ABNORMAL HIGH (ref 70–99)

## 2022-08-20 LAB — CREATININE, SERUM
Creatinine, Ser: 1.27 mg/dL — ABNORMAL HIGH (ref 0.61–1.24)
GFR, Estimated: >60 mL/min (ref >60)

## 2022-08-20 MED ORDER — FUROSEMIDE 40 MG PO TABS
40.0000 mg | ORAL_TABLET | Freq: Every day | ORAL | Status: DC
  Administered 2022-08-21 – 2022-08-22 (×2): 40 mg via ORAL
  Filled 2022-08-20 (×2): qty 1

## 2022-08-20 MED ORDER — FUROSEMIDE 40 MG PO TABS: 40.0000 mg | ORAL_TABLET | Freq: Every day | ORAL | Status: DC

## 2022-08-20 MED ORDER — CARVEDILOL 12.5 MG PO TABS
12.5000 mg | ORAL_TABLET | Freq: Two times a day (BID) | ORAL | Status: DC
  Administered 2022-08-20 – 2022-08-22 (×4): 12.5 mg via ORAL
  Filled 2022-08-20 (×4): qty 1

## 2022-08-20 MED ORDER — OXYCODONE HCL 5 MG PO TABS
5.0000 mg | ORAL_TABLET | Freq: Four times a day (QID) | ORAL | Status: DC | PRN
  Administered 2022-08-20 – 2022-08-21 (×2): 5 mg via ORAL
  Filled 2022-08-20 (×3): qty 1

## 2022-08-20 MED ORDER — INSULIN GLARGINE-YFGN 100 UNIT/ML ~~LOC~~ SOLN
14.0000 [IU] | Freq: Every day | SUBCUTANEOUS | Status: DC
  Administered 2022-08-21 – 2022-08-22 (×2): 14 [IU] via SUBCUTANEOUS
  Filled 2022-08-20 (×2): qty 0.14

## 2022-08-20 MED ORDER — CARVEDILOL 25 MG PO TABS: 25.0000 mg | ORAL_TABLET | Freq: Two times a day (BID) | ORAL | Status: DC

## 2022-08-20 NOTE — Progress Notes (Addendum)
Rounding Note    Patient Name: Alan Tapia. Date of Encounter: 08/20/2022  [facility] Cardiologist: None   Subjective   States he feels nauseas with the heart medication but is unsure which one. No chest pain or SOB.  Inpatient Medications    Scheduled Meds:  acetaminophen  1,000 mg Oral TID   vitamin C  1,000 mg Oral Daily   carvedilol  25 mg Oral BID WC   docusate sodium  100 mg Oral Daily   enoxaparin (LOVENOX) injection  80 mg Subcutaneous Q24H   [START ON 08/21/2022] furosemide  40 mg Oral Daily   insulin aspart  0-5 Units Subcutaneous QHS   insulin aspart  0-9 Units Subcutaneous TID WC   insulin glargine-yfgn  10 Units Subcutaneous Daily   multivitamin with minerals  1 tablet Oral Daily   pantoprazole  40 mg Oral Daily   sacubitril-valsartan  1 tablet Oral BID   zinc sulfate  220 mg Oral Daily   Continuous Infusions:  magnesium sulfate bolus IVPB     PRN Meds: alum & mag hydroxide-simeth, bisacodyl, guaiFENesin-dextromethorphan, hydrALAZINE, hydrALAZINE, hydrocortisone cream, HYDROmorphone (DILAUDID) injection, labetalol, LORazepam, LORazepam, magnesium citrate, magnesium sulfate bolus IVPB, metoprolol tartrate, ondansetron **OR** ondansetron (ZOFRAN) IV, phenol, polyethylene glycol   Vital Signs    Vitals:   08/18/22 2148 08/19/22 0740 08/19/22 1400 08/19/22 2100  BP: (!) 152/72 (!) 151/82 (!) 156/82 (!) 158/80  Pulse: 85 80 85 77  Resp: 17 18 18    Temp: 97.6 F (36.4 C) 98.5 F (36.9 C)  97.6 F (36.4 C)  TempSrc:  Oral Oral Oral  SpO2: 98% 99% 100% 99%  Weight:      Height:        Intake/Output Summary (Last 24 hours) at 08/20/2022 1052 Last data filed at 08/19/2022 1930 Gross per 24 hour  Intake 360 ml  Output --  Net 360 ml      08/15/2022   12:10 PM 08/13/2022   12:06 PM 06/06/2022    1:30 PM  Last 3 Weights  Weight (lbs) 374 lb 374 lb 375 lb  Weight (kg) 169.645 kg 169.645 kg 170.099 kg      Telemetry    NSR - Personally  Reviewed  ECG    No new tracing - Personally Reviewed  Physical Exam   GEN: No acute distress.   Neck: No JVD Cardiac: RRR, no murmurs, rubs, or gallops.  Respiratory: Clear to auscultation bilaterally. GI: Obese, soft MS: Left BKA, 1-2+ RLE edema (chronic) Neuro:  Nonfocal  Psych: Normal affect   Labs    High Sensitivity Troponin:  No results for input(s): "TROPONINIHS" in the last 720 hours.   Chemistry Recent Labs  Lab 08/16/22 0215 08/17/22 0323 08/19/22 1033 08/19/22 1739 08/20/22 0707  NA 132* 136 136  --   --   K 3.4* 3.6 3.3*  --   --   CL 101 103 106  --   --   CO2 23 25 25   --   --   GLUCOSE 247* 142* 171*  --   --   BUN 7 6 <5*  --   --   CREATININE 1.11 1.14 0.97  --  1.27*  CALCIUM 7.4* 7.6* 7.5*  --   --   MG  --   --   --  1.7  --   GFRNONAA >60 >60 >60  --  >60  ANIONGAP 8 8 5   --   --  Lipids No results for input(s): "CHOL", "TRIG", "HDL", "LABVLDL", "LDLCALC", "CHOLHDL" in the last 168 hours.  Hematology Recent Labs  Lab 08/16/22 0215 08/17/22 0323 08/20/22 0707  WBC 8.6 8.1 6.6  RBC 3.56* 3.49* 3.64*  HGB 8.1* 8.0* 8.3*  HCT 26.4* 25.8* 27.5*  MCV 74.2* 73.9* 75.5*  MCH 22.8* 22.9* 22.8*  MCHC 30.7 31.0 30.2  RDW 15.2 15.6* 16.8*  PLT 367 397 423*   Thyroid No results for input(s): "TSH", "FREET4" in the last 168 hours.  BNPNo results for input(s): "BNP", "PROBNP" in the last 168 hours.  DDimer No results for input(s): "DDIMER" in the last 168 hours.   Radiology    No results found.  Cardiac Studies   Echo from 08/17/22:     1. Left ventricular ejection fraction, by estimation, is 35 to 40%. The  left ventricle has moderately decreased function. The left ventricle  demonstrates global hypokinesis. The left ventricular internal cavity size  was mildly dilated. Indeterminate  diastolic filling due to E-A fusion.   2. Right ventricular systolic function is normal. The right ventricular  size is normal. Tricuspid  regurgitation signal is inadequate for assessing  PA pressure.   3. Left atrial size was mildly dilated.   4. The mitral valve is grossly normal. Mild mitral valve regurgitation.   5. The aortic valve is tricuspid. Aortic valve regurgitation is not  visualized.   6. The inferior vena cava is dilated in size with <50% respiratory  variability, suggesting right atrial pressure of 15 mmHg.   Patient Profile     47 y.o. male  with history of poorly controlled DMII, uncontrolled HTN, lymphedema, chronic left foot diabetic ulcer, morbid obesity and medication noncompliance who presented with worsening left lower extremity ulcer swelling and draining found to have osteomyelitis/septic arthritis and ultimately underwent BKA. TTE this admission that was obtained for LE edema showed EF 35-40% global hypokinesis, mild LV dilation, indeterminate diastolic parameters, normal RV, mild LAE, mild MR. Cardiology is now consulted for newly diagnosed systolic HF.   Assessment & Plan    #Newly Diagnosed Acute Systolic HF: -TTE this admission with LVEF 35-40% from 50-55% in 12/2021 -No ischemic symptoms but patient at high risk for CAD  -May also be related to uncontrolled HTN or stress induced CM in the setting of sepsis -Will continue to optimize GDMT as tolerated with plans for possible ischemic work-up as outpatient if EF remains depressed -Continue lasix 40mg  PO daily -Keep coreg at 12.5mg  BID for now due to nausea -Continue entresto 24/26mg  BID -Check daily BMET while adjusting meds -Will not start spiro for now given bump in Cr -Not adding SGLT2i until DM better controlled  #HTN: -Remains elevated in 150s -Keep coreg at 12.5mg  BID for now due to nausea -Will increase entresto and add spiro once able pending renal function  #Chronic left foot diabetic ulcer with acute on chronic osteomyelitis s/p left BKA  -Management per primary team  #Uncontrolled type II DM: -Per primary team      For  questions or updates, please contact [facility] Please consult [email or web address] for contact info under        Signed, Meriam Sprague, MD  08/20/2022, 10:52 AM

## 2022-08-20 NOTE — Progress Notes (Signed)
Physical Therapy Treatment Patient Details Name: Forney Westmoreland. MRN: [id number] DOB: 1976/04/14 Today's Date: 08/20/2022   History of Present Illness Pt is a 47 y.o. male admitted 4/3 with 6 mo history of L foot ulcer; Presenting with swelling and progressive yellow discharge from the foot. 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 PMH significant for DMII, HTN.    PT Comments    Great effort from patient, working on functional strength with sit<>stand transfers from lower surfaces and standing balance. Required up to mod assist at times due to posterior LOB during transition to rise from medium bed height. Small pivot in standing, unable to fully unload RLE to take a step yet but puts forth great effort. Lateral scoots along bed with supervision. Pt recently returned from rest room and is fatigued. Left in bed Lt side 1/4 turn to unload buttock. Patient will continue to benefit from skilled physical therapy services to further improve independence with functional mobility. Patient will benefit from intensive inpatient follow up therapy, >3 hours/day   Recommendations for follow up therapy are one component of a multi-disciplinary discharge planning process, led by the attending physician.  Recommendations may be updated based on patient status, additional functional criteria and insurance authorization.  Follow Up Recommendations  Can patient physically be transported by private vehicle: No    Assistance Recommended at Discharge Frequent or constant Supervision/Assistance  Patient can return home with the following Two people to help with walking and/or transfers;Two people to help with bathing/dressing/bathroom;Assistance with cooking/housework;Assist for transportation;Help with stairs or ramp for entrance   Equipment Recommendations  None recommended by PT    Recommendations for Other Services       Precautions / Restrictions Precautions Precautions: Fall;Knee Precaution Comments: Reviewed  prec Required Braces or Orthoses: Other Brace Other Brace: LLE brace (Amp[place]ield LLE.) Restrictions Weight Bearing Restrictions: Yes LLE Weight Bearing: Non weight bearing     Mobility  Bed Mobility Overal bed mobility: Needs Assistance Bed Mobility: Supine to Sit, Sit to Supine     Supine to sit: Supervision, HOB elevated     General bed mobility comments: extra time and use of bed rails. No  physical assist for LLE today    Transfers Overall transfer level: Needs assistance Equipment used: Rolling walker (2 wheels) Transfers: Sit to/from Stand Sit to Stand: Mod assist, From elevated surface          Lateral/Scoot Transfers: Supervision General transfer comment: Stood from highly elevated surface with min assist for boost and balance. Lowered bed moderately and required mod assist with heavy posterior LOB during transition, Rt knee bracing against bed. Cues for technique. Attempted lateral pivot along bed with modest success of <1 foot. Max cues for technique with UE support on RW. Able to scoot laterally along bed at supervision level.    Ambulation/Gait             Pre-gait activities: Weight shift lateral and a/p with RW support, min assist for balance.     Stairs             Wheelchair Mobility    Modified Rankin (Stroke Patients Only)       Balance Overall balance assessment: Needs assistance Sitting-balance support: No upper extremity supported, Feet supported Sitting balance-Leahy Scale: Good     Standing balance support: Reliant on assistive device for balance, Single extremity supported Standing balance-Leahy Scale: Poor Standing balance comment: Briefly standing with single UE support.  Cognition Arousal/Alertness: Awake/alert Behavior During Therapy: WFL for tasks assessed/performed Overall Cognitive Status: Within Functional Limits for tasks assessed                                           Exercises Other Exercises Other Exercises: Standing balance, tolerated up to 3 minutes x2 with RW for support. Progressed with single UE support, heel raise on Rt (fatigues rapidly,) gentle knee bend/squat.    General Comments        Pertinent Vitals/Pain Pain Assessment Pain Assessment: Faces Faces Pain Scale: Hurts even more Pain Location: Residual limb Lt Pain Descriptors / Indicators: Aching (+ phantom pain) Pain Intervention(s): Limited activity within patient's tolerance, Monitored during session, Repositioned    Home Living                          Prior Function            PT Goals (current goals can now be found in the care plan section) Acute Rehab PT Goals Patient Stated Goal: get well, go to rehab. PT Goal Formulation: With patient/family Time For Goal Achievement: 08/30/22 Potential to Achieve Goals: Good Progress towards PT goals: Progressing toward goals    Frequency    Min 4X/week      PT Plan Current plan remains appropriate    Co-evaluation              AM-PAC PT "6 Clicks" Mobility   Outcome Measure  Help needed turning from your back to your side while in a flat bed without using bedrails?: A Little Help needed moving from lying on your back to sitting on the side of a flat bed without using bedrails?: A Little Help needed moving to and from a bed to a chair (including a wheelchair)?: A Little Help needed standing up from a chair using your arms (e.g., wheelchair or bedside chair)?: Total Help needed to walk in hospital room?: Total Help needed climbing 3-5 steps with a railing? : Total 6 Click Score: 12    End of Session Equipment Utilized During Treatment: Gait belt Activity Tolerance: Patient tolerated treatment well Patient left: in bed;with call bell/phone within reach;with bed alarm set   PT Visit Diagnosis: Unsteadiness on feet (R26.81);Muscle weakness (generalized) (M62.81);Difficulty in walking,  not elsewhere classified (R26.2);Other abnormalities of gait and mobility (R26.89) Pain - Right/Left: Left Pain - part of body: Leg     Time: 1194-1740 PT Time Calculation (min) (ACUTE ONLY): 30 min  Charges:  $Therapeutic Activity: 8-22 mins $Neuromuscular Re-education: 8-22 mins                     Kathlyn Sacramento, PT, DPT Physical Therapist Acute Rehabilitation Services [facility] Outpatient Rehabilitation Services [facility]    Berton Mount 08/20/2022, 4:19 PM

## 2022-08-20 NOTE — Progress Notes (Signed)
PROGRESS NOTE  Alan Tapia.  DOB: 06-26-1975  PCP: Patient, No Pcp Per UTM:[id number]  DOA: 08/13/2022  LOS: 7 days  Hospital Day: 8  Brief narrative: Alan Tapia. is a 47 y.o. male with PMH significant for morbid obesity, uncontrolled DM, HTN who has a 70-month history of a left foot ulcer for which she was seeing a podiatrist weekly for debridement at [name], last debridement 06/10/2022. He recently moved to [place] due to a family emergency but he was traveling back-and-forth to see the same podiatrist in [name].  He also had a PICC line and was receiving vancomycin infusions.  PICC line fell out before completion of the antibiotic course for which he did not follow-up.   4/3, presented to ED with complaint of progressive swelling, and clear yellow discharge from the foot.  In the ED, patient was afebrile, hemodynamically stable and breathing on room air. MRI left foot showed large plantar foot wound with possible septic arthritis, abscess and worsening osteomyelitis.   EDP discussed with orthopedics 4/5, underwent left BKA by Dr. Lajoyce Corners  Subjective: Patient was seen and examined this morning.   Sitting up at the edge of the bed.  Seen by PT. Wife at bedside. Pending CIR.  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 Lajoyce Corners Wound VAC in place. Initially covered on broad-spectrum IV antibiotics.  Per Dr. Audrie Lia note, infection was clear with amputation and hence antibiotics was stopped after 24 hours of surgery. Currently on pain control with scheduled Tylenol, scheduled oxycodone and as needed Dilaudid.    New diagnosis of systolic CHF Essential hypertension Patient had bilateral lower EXTR lymphedema.  Now with left amputation status continues to have lymphedema on the right. 4/7, echocardiogram obtained which showed a new low EF of 35 to 40% with global  hypokinesis. Cardiology consultation obtained. Medicines being adjusted based on hemodynamics and renal function. Currently on Coreg 12.5 mg twice daily, Lasix 40 mg daily and Entresto 24/26 mg twice daily. Per cardiology, repeat echo in 4 to 6 weeks.  Ischemia evaluation may be needed if EF remains down. Net IO Since Admission: 5,830.86 mL [08/20/22 1246] Continue to monitor for daily intake output, weight, blood pressure, BNP, renal function and electrolytes. Recent Labs  Lab 08/14/22 0500 08/15/22 0309 08/16/22 0215 08/17/22 0323 08/19/22 1033 08/19/22 1739 08/20/22 0707  BUN 9 6 7 6  <5*  --   --   CREATININE 1.16 1.18 1.11 1.14 0.97  --  1.27*  K 3.1* 3.5 3.4* 3.6 3.3*  --   --   MG  --   --   --   --   --  1.7  --     Acute on chronic anemia Baseline hemoglobin between 9 and 10.  Postop drop noted. Hemoglobin remained stable between 8 and 9 for the last 4 to.  Continue to monitor.  Transfuse if less than 7. Recent Labs    08/14/22 0500 08/15/22 0309 08/16/22 0215 08/17/22 0323 08/20/22 0707  HGB 8.7* 8.7* 8.1* 8.0* 8.3*  MCV 71.8* 74.6* 74.2* 73.9* 75.5*    Type 2 diabetes mellitus uncontrolled with hyperglycemia A1c 12.3 in January 2024.  Repeat A1c on 4/3 is better at 10.3. Diabetes care coordinator consulted. Currently on Semglee 10 units daily and sliding scale insulin with Accu-Cheks.  Blood sugar level trending up.  Increase Semglee to 14 units daily. Recent Labs  Lab 08/19/22 [phone]  08/19/22 1147 08/19/22 1827 08/19/22 2039 08/20/22 1031  GLUCAP 123* 162* 243* 211* 173*    Morbid Obesity  Body mass index is 45.52 kg/m. Patient has been advised to make an attempt to improve diet and exercise patterns to aid in weight loss.  Mobility: PT eval obtained.  CIR recommended  Goals of care   Code Status: Full Code     DVT prophylaxis: Lovenox   Antimicrobials: Not on antibiotics Fluid: IV fluid stopped Consultants: Orthopedics, cardiology Family  Communication: Wife at bedside  Status: Inpatient Level of care:  Telemetry Medical   Needs to continue in-hospital care:  New diagnosis of CHF.  Pending CIR approval  Patient from: Home Anticipated d/c to: CIR      Diet:  Diet Order             Diet Carb Modified Fluid consistency: Thin; Room service appropriate? Yes  Diet effective now                   Scheduled Meds:  acetaminophen  1,000 mg Oral TID   vitamin C  1,000 mg Oral Daily   carvedilol  12.5 mg Oral BID WC   docusate sodium  100 mg Oral Daily   enoxaparin (LOVENOX) injection  80 mg Subcutaneous Q24H   [START ON 08/21/2022] furosemide  40 mg Oral Daily   insulin aspart  0-5 Units Subcutaneous QHS   insulin aspart  0-9 Units Subcutaneous TID WC   [START ON 08/21/2022] insulin glargine-yfgn  14 Units Subcutaneous Daily   multivitamin with minerals  1 tablet Oral Daily   pantoprazole  40 mg Oral Daily   sacubitril-valsartan  1 tablet Oral BID   zinc sulfate  220 mg Oral Daily    PRN meds: alum & mag hydroxide-simeth, bisacodyl, guaiFENesin-dextromethorphan, hydrALAZINE, hydrALAZINE, hydrocortisone cream, HYDROmorphone (DILAUDID) injection, labetalol, LORazepam, LORazepam, magnesium citrate, magnesium sulfate bolus IVPB, metoprolol tartrate, ondansetron **OR** ondansetron (ZOFRAN) IV, phenol, polyethylene glycol   Infusions:   magnesium sulfate bolus IVPB      Antimicrobials: Anti-infectives (From admission, onward)    Start     Dose/Rate Route Frequency Ordered Stop   08/15/22 1225  ceFAZolin (ANCEF) 2-4 GM/100ML-% IVPB  Status:  Discontinued       Note to Pharmacy: Gleason, Ginger E: cabinet override      08/15/22 1225 08/15/22 1246   08/15/22 1156  ceFAZolin (ANCEF) 3-0.9 GM/100ML-% IVPB       Note to Pharmacy: Phebe CollaHOMPSON, LUISA N: cabinet override      08/15/22 1156 08/15/22 2359   08/14/22 1300  ceFAZolin (ANCEF) IVPB 3g/100 mL premix  Status:  Discontinued        3 g 200 mL/hr over 30 Minutes  Intravenous On call to O.R. 08/14/22 1128 08/15/22 0559   08/14/22 0215  linezolid (ZYVOX) IVPB 600 mg  Status:  Discontinued       Note to Pharmacy: Patient not tolerating vanc IV even at lower rate, changing to zyvox. Patient denies history of past zyvox reaction. First vanc dose was not fully given.   600 mg 300 mL/hr over 60 Minutes Intravenous Every 12 hours 08/14/22 0124 08/16/22 1120   08/14/22 0200  vancomycin (VANCOREADY) IVPB 2000 mg/400 mL  Status:  Discontinued        2,000 mg 100 mL/hr over 240 Minutes Intravenous Every 12 hours 08/13/22 1750 08/14/22 0225   08/13/22 2200  cefTRIAXone (ROCEPHIN) 2 g in sodium chloride 0.9 % 100 mL IVPB  Status:  Discontinued        2 g 200 mL/hr over 30 Minutes Intravenous Every 24 hours 08/13/22 1646 08/17/22 1202   08/13/22 1345  vancomycin (VANCOREADY) IVPB 2000 mg/400 mL        2,000 mg 200 mL/hr over 120 Minutes Intravenous  Once 08/13/22 1311 08/13/22 1626   08/13/22 1315  metroNIDAZOLE (FLAGYL) IVPB 500 mg  Status:  Discontinued        500 mg 100 mL/hr over 60 Minutes Intravenous Every 12 hours 08/13/22 1307 08/17/22 1202   08/13/22 1315  ceFEPIme (MAXIPIME) 2 g in sodium chloride 0.9 % 100 mL IVPB        2 g 200 mL/hr over 30 Minutes Intravenous  Once 08/13/22 1311 08/13/22 1406       Nutritional status:  Body mass index is 45.52 kg/m.  Nutrition Problem: Increased nutrient needs Etiology: wound healing, post-op healing Signs/Symptoms: estimated needs     Objective: Vitals:   08/19/22 1400 08/19/22 2100  BP: (!) 156/82 (!) 158/80  Pulse: 85 77  Resp: 18   Temp:  97.6 F (36.4 C)  SpO2: 100% 99%    Intake/Output Summary (Last 24 hours) at 08/20/2022 1246 Last data filed at 08/19/2022 1930 Gross per 24 hour  Intake 360 ml  Output --  Net 360 ml    Filed Weights   08/13/22 1206 08/15/22 1210  Weight: (!) 169.6 kg (!) 169.6 kg   Weight change:  Body mass index is 45.52 kg/m.   Physical Exam: General exam:  Pleasant, middle-aged African-American male. Skin: No rashes, lesions or ulcers. HEENT: Atraumatic, normocephalic, no obvious bleeding Lungs: Clear to auscultation bilaterally CVS: Regular rate and rhythm, no murmur GI/Abd soft, nontender, nondistended, bowel sound present. CNS: Alert, awake, oriented x 3 Psychiatry: Mood appropriate Extremities: Gradually improving right lower extremity edema.  Left lower extremity BKA status.  On boot and wound VAC  Data Review: I have personally reviewed the laboratory data and studies available.  F/u labs ordered Unresulted Labs (From admission, onward)     Start     Ordered   08/21/22 0500  Basic metabolic panel  Daily,   R     Question:  Specimen collection method  Answer:  Lab=Lab collect   08/20/22 1052   08/20/22 0500  Creatinine, serum  (enoxaparin (LOVENOX)    CrCl >/= 30 ml/min)  Weekly,   R     Comments: while on enoxaparin therapy    08/13/22 1846            Total time spent in review of labs and imaging, patient evaluation, formulation of plan, documentation and communication with family: 45 minutes  Signed, Lorin Glass, MD [facility] 08/20/2022

## 2022-08-20 NOTE — Progress Notes (Signed)
Occupational Therapy Treatment Patient Details Name: Alan Tapia. MRN: [id number] DOB: 1975-08-22 Today's Date: 08/20/2022   History of present illness Pt is a 47 y.o. male admitted 4/3 with 6 mo history of L foot ulcer; Presenting with swelling and progressive yellow discharge from the foot. 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 PMH significant for DMII, HTN.   OT comments  Pt progressing toward established OT goals. Pt able to don R sock and shoe sitting EOB with use of compensatory techniques. Pt performing BUE HEP to optimize ability to support weight with BUE during transfers. Chair push ups and lateral scoots along EOB toward L and R with supervision Assist. Pt deferring staying in chair today due to continued nausea with changes in medications. Will continue to follow acutely.    Recommendations for follow up therapy are one component of a multi-disciplinary discharge planning process, led by the attending physician.  Recommendations may be updated based on patient status, additional functional criteria and insurance authorization.    Assistance Recommended at Discharge Frequent or constant Supervision/Assistance  Patient can return home with the following  Two people to help with walking and/or transfers;Two people to help with bathing/dressing/bathroom;Assistance with cooking/housework;Assist for transportation;Help with stairs or ramp for entrance   Equipment Recommendations  Other (comment) (Pt in process of getting ramp to access home; defer other eqiupment needs to next venue)    Recommendations for Other Services Rehab consult    Precautions / Restrictions Precautions Precautions: Fall Required Braces or Orthoses: Other Brace Other Brace: LLE brace Restrictions Weight Bearing Restrictions: Yes LLE Weight Bearing: Non weight bearing       Mobility Bed Mobility Overal bed mobility: Needs Assistance Bed Mobility: Supine to Sit     Supine to sit: Supervision, HOB elevated      General bed mobility comments: extra time and use of bed rails    Transfers Overall transfer level: Needs assistance   Transfers: Bed to chair/wheelchair/BSC            Lateral/Scoot Transfers: Supervision General transfer comment: for safety toward both L and R.     Balance Overall balance assessment: Needs assistance Sitting-balance support: No upper extremity supported, Feet supported Sitting balance-Leahy Scale: Good                                     ADL either performed or assessed with clinical judgement   ADL Overall ADL's : Needs assistance/impaired     Grooming: Set up;Sitting           Upper Body Dressing : Set up;Sitting Upper Body Dressing Details (indicate cue type and reason): new gown Lower Body Dressing: Min guard;Sitting/lateral leans Lower Body Dressing Details (indicate cue type and reason): to don RLE sock and shoe                    Extremity/Trunk Assessment Upper Extremity Assessment Upper Extremity Assessment: Overall WFL for tasks assessed   Lower Extremity Assessment Lower Extremity Assessment: Defer to PT evaluation        Vision       Perception     Praxis      Cognition Arousal/Alertness: Awake/alert Behavior During Therapy: WFL for tasks assessed/performed Overall Cognitive Status: Within Functional Limits for tasks assessed  General Comments: Motivated to participate in therapy despite not feeling well        Exercises Exercises: General Upper Extremity General Exercises - Upper Extremity Shoulder Flexion: AROM, Strengthening, 20 reps, Theraband Theraband Level (Shoulder Flexion): Level 3 (Green) Elbow Flexion: AROM, Strengthening, 20 reps, Theraband Theraband Level (Elbow Flexion): Level 3 (Green) Elbow Extension: AROM, Strengthening, 20 reps, Theraband Theraband Level (Elbow Extension): Level 3 (Green)    Shoulder Instructions        General Comments      Pertinent Vitals/ Pain       Pain Assessment Pain Assessment: 0-10 Pain Score: 7  Pain Location: abdomen Pain Descriptors / Indicators: Discomfort Pain Intervention(s): Limited activity within patient's tolerance, Monitored during session  Home Living                                          Prior Functioning/Environment              Frequency  Min 2X/week        Progress Toward Goals  OT Goals(current goals can now be found in the care plan section)  Progress towards OT goals: Progressing toward goals  Acute Rehab OT Goals Patient Stated Goal: get better at performing transfers OT Goal Formulation: With patient Time For Goal Achievement: 08/30/22 Potential to Achieve Goals: Good ADL Goals Pt Will Perform Lower Body Dressing: with min guard assist;sit to/from stand Pt Will Transfer to Toilet: with supervision;with transfer board;squat pivot transfer Additional ADL Goal #1: Pt will verbalize desensitization techniques for LLE  Plan Discharge plan remains appropriate    Co-evaluation                 AM-PAC OT "6 Clicks" Daily Activity     Outcome Measure   Help from another person eating meals?: None Help from another person taking care of personal grooming?: A Little Help from another person toileting, which includes using toliet, bedpan, or urinal?: A Lot Help from another person bathing (including washing, rinsing, drying)?: A Lot Help from another person to put on and taking off regular upper body clothing?: A Little Help from another person to put on and taking off regular lower body clothing?: A Little 6 Click Score: 17    End of Session Equipment Utilized During Treatment:  (amputee brace)  OT Visit Diagnosis: Unsteadiness on feet (R26.81);Other abnormalities of gait and mobility (R26.89);Muscle weakness (generalized) (M62.81);Pain Pain - Right/Left: Left Pain - part of body: Leg (abdomen)   Activity  Tolerance Patient tolerated treatment well   Patient Left in bed;with call bell/phone within reach;with family/visitor present   Nurse Communication Mobility status        Time: 5537-4827 OT Time Calculation (min): 26 min  Charges: OT General Charges $OT Visit: 1 Visit OT Treatments $Self Care/Home Management : 8-22 mins $Therapeutic Activity: 8-22 mins  Myrla Halsted, OTD, OTR/L [facility] Acute Rehabilitation Office: [phone]   Myrla Halsted 08/20/2022, 12:08 PM

## 2022-08-20 NOTE — Progress Notes (Signed)
Inpatient Rehabilitation Admissions Coordinator   [facility] will pursue rate negotiation with [facility] today. I will forward to financing, Amada Kingfisher,  and await their contract negotiation.  Ottie Glazier, RN, MSN Rehab Admissions Coordinator [phone] 08/20/2022 11:12 AM

## 2022-08-20 NOTE — Progress Notes (Signed)
   [name]   PCP: Patient, No Pcp Per PCP-Cardiologist: None    HPI:  47 yo M with PMH of chronic osteomyelitis, T2DM, and HTN.   Presented to the ED on 4/3 with L foot infection. Reports he was being treated for osteomyelitis in [name] but PICC has fallen out and he was unable to get vancomycin for 4 weeks. Recently moved to [facility]. In the ED, MRI showed septic arthritis with possible abscess and continued/worsening osteomyelitis. Ortho consulted and L BKA was done on 4/5. Continued LE edema on R leg. ECHO on 4/7 showed EF reduced to 35-40%, global hypokinesis, RV normal. Cardiology consulted. Possible CIR at discharge.   Current HF Medications: Diuretic: furosemide 40 mg PO daily Beta Blocker: carvedilol 12.5 mg BID ACE/ARB/ARNI: Entresto 24/26 mg BID  Prior to admission HF Medications: None  Pertinent Lab Values: Serum creatinine 1.27, BUN <5, Potassium 3.3, Sodium 136, A1c 10.3   Vital Signs: Weight: 374 lbs Blood pressure: 160/90s  Heart rate: 70-80s  I/O: incomplete  Medication Assistance / Insurance Benefits Check: Does the patient have prescription insurance?  Yes Type of insurance plan: out of state Medicaid > BCBS commercial plan starting 5/1  Outpatient Pharmacy:  Prior to admission outpatient pharmacy: [facility] Is the patient willing to use [facility] at discharge? Yes Is the patient willing to transition their outpatient pharmacy to utilize a [facility] outpatient pharmacy?   Pending    Assessment: 1. Acute systolic CHF (LVEF 35-40%). NYHA class II symptoms. - Continue furosemide 40 mg PO daily. Strict I/Os and daily weights. Keep K>4.  - Continue carvedilol 12.5 mg BID - Continue Entresto 24/26 mg BID  - Consider adding spironolactone 25 mg daily prior to discharge pending creatinine stabilization. Bump in creatinine likely due to starting ARNI.  - Caution SGLT2i with A1c >10   Plan: 1) Medication changes  recommended at this time: - Start spironolactone if renal function stable   2) Patient assistance: - Has out of state Medicaid  - Case management discussed with patient and his wife - he has a Insurance underwriter starting on 5/1  3)  Education  - To be completed prior to discharge  Sharen Hones, PharmD, BCPS Heart Failure Engineer, building services Phone [phone]

## 2022-08-20 NOTE — Progress Notes (Signed)
Heart Failure Nurse Navigator Progress Note  PCP: Patient, No Pcp Per PCP-Cardiologist: None Admission Diagnosis: Osteomyelitis of left foot, Sepsis Admitted from: Home  Presentation:   Alan Tapia. presented with infection in left foot, been treated since 11/23 for a bone infection, moved here recently from [name], reported he had a PICC line that fell out around 4 weeks ago and hasn't been able to continue his vancomycin treatment. Now has increased swelling and clear/yellow drainage from foot. BP 176/86, HR 100, BMI 45.52, Ortho consulted, a left BKA was preformed on 4/5, continued edema to right leg a Echo was done on 4/7 showed LVEF 35-40%. Cardiology consulted and plans for patient to go to CIR at discharge.   Patient and wife were educated on the sign and symptoms of heart failure, daily weights, when to call his doctor or go to the ED, diet/ fluid restrictions, taking all his medications as prescribed and attending all his medical appointments. Patient and wife verbalized their understanding. A hospital HF TOC appointment was scheduled for 09/15/2022 @ 9 am, after he is discharged from CIR.   ECHO/ LVEF: 35-40% new  Clinical Course:  Past Medical History:  Diagnosis Date   Hypertension    Uncontrolled type 2 diabetes mellitus with hyperglycemia, with long-term current use of insulin 06/06/2022     Social History   Socioeconomic History   Marital status: Married    Spouse name: [name]   Number of children: 0   Years of education: Not on file   Highest education level: Bachelor's degree (e.g., BA, AB, BS)  Occupational History    Comment: Just moved to [place] from [name]  Tobacco Use   Smoking status: Never   Smokeless tobacco: Never  Vaping Use   Vaping Use: Never used  Substance and Sexual Activity   Alcohol use: No   Drug use: No   Sexual activity: Not on file  Other Topics Concern   Not on file  Social History Narrative   Not on file   Social Determinants  of Health   Financial Resource Strain: Patient Unable To Answer (08/20/2022)   Overall Financial Resource Strain (CARDIA)    Difficulty of Paying Living Expenses: Patient unable to answer  Food Insecurity: No Food Insecurity (08/13/2022)   Hunger Vital Sign    Worried About Running Out of Food in the Last Year: Never true    Ran Out of Food in the Last Year: Never true  Transportation Needs: No Transportation Needs (08/13/2022)   PRAPARE - Administrator, Civil Service (Medical): No    Lack of Transportation (Non-Medical): No  Physical Activity: Not on file  Stress: Not on file  Social Connections: Not on file   Education Assessment and Provision:  Detailed education and instructions provided on heart failure disease management including the following:  Signs and symptoms of Heart Failure When to call the physician Importance of daily weights Low sodium diet Fluid restriction Medication management Anticipated future follow-up appointments  Patient education given on each of the above topics.  Patient acknowledges understanding via teach back method and acceptance of all instructions.  Education Materials:  "Living Better With Heart Failure" Booklet, HF zone tool, & Daily Weight Tracker Tool.  Patient has scale at home: yes Patient has pill box at home: yes     High Risk Criteria for Readmission and/or Poor Patient Outcomes: Heart failure hospital admissions (last 6 months): 0  No Show rate: 14 % Difficult social situation: No, recently  moved to [facility] from [name].   Demonstrates medication adherence: No Primary Language: English Literacy level: Reading, writing, and comprehension.   Barriers of Care:   Diet/ fluids/ daily weights Medication compliance New HF education  Considerations/Referrals:   Referral made to Heart Failure Pharmacist Stewardship: Yes, costs Referral made to Heart Failure CSW/NCM TOC: Yes, out of state medicaid, trying to get [facility]  medicaid Referral made to Heart & Vascular TOC clinic: Yes, 09/15/2022 @ 9 am after discharge from CIR following a left BKA.   Items for Follow-up on DC/TOC: Diet/ fluids/ daily weights Medication compliance New HF education continued.    Rhae Hammock, BSN, Scientist, clinical (histocompatibility and immunogenetics) Only

## 2022-08-20 NOTE — Progress Notes (Signed)
Inpatient Rehabilitation Admissions Coordinator   Medicaid of [name] state we are out of network, therefore they will not cover CIR admit in [place]. I have asked them to reconsider as it would be difficult to medically transport him to [name] to provide for post acute rehab services. Case manager will discuss with supervisor and update me today. I met with patient at bedside to discuss admission to [facility] CIR as self pay if [facility] denies. I provided an Estimate of cost of care. He is advised to also cancel his [name] Medicaid to be able to pursue  disability and Medicaid. He is in agreement. I await [name] Medicaid follow up today before pursuing admit to [facility] CIR today.  Ottie Glazier, RN, MSN Rehab Admissions Coordinator [phone] 08/20/2022 10:41 AM

## 2022-08-20 NOTE — Inpatient Diabetes Management (Addendum)
Inpatient Diabetes Program Recommendations  AACE/ADA: New Consensus Statement on Inpatient Glycemic Control (2015)  Target Ranges:  Prepandial:   less than 140 mg/dL      Peak postprandial:   less than 180 mg/dL (1-2 hours)      Critically ill patients:  140 - 180 mg/dL   Lab Results  Component Value Date   GLUCAP 173 (H) 08/20/2022   HGBA1C 10.3 (H) 08/13/2022    Review of Glycemic Control  Latest Reference Range & Units 08/19/22 11:47 08/19/22 18:27 08/19/22 20:39 08/20/22 10:31  Glucose-Capillary 70 - 99 mg/dL 366 (H) 440 (H) 347 (H) 173 (H)  (H): Data is abnormally high Diabetes history: Type 2 DM Outpatient Diabetes medications: Lantus 20 units QD, Humalog 15 units TID Current orders for Inpatient glycemic control: Semglee 10 units QD, Novolog 0-9 units TID & HS  Inpatient Diabetes Program Recommendations:    Consider increasing Semglee 14 units QD and adding Novolog 3 units TID (assuming patient is consuming >50% of meals).   Spoke with patient regarding outpatient diabetes management.  Reviewed patient's current A1c of 10.3%. Explained what a A1c is and what it measures. Also reviewed goal A1c with patient, importance of good glucose control @ home, and blood sugar goals. Reviewed patho of DM, role of pancreas, impact of poor glycemic control, risk of infection, interventions, vascular changes and commorbidities.  Patient has a meter and supplies at home. Reviewed when to reach out to PCP.  Reviewed importance of CHO mindfulness, protein and basic carb counting. No further questions at this time.    Thanks, Lujean Rave, MSN, RNC-OB Diabetes Coordinator [phone] (8a-5p)

## 2022-08-21 DIAGNOSIS — M86272 Subacute osteomyelitis, left ankle and foot: Secondary | ICD-10-CM | POA: Diagnosis not present

## 2022-08-21 DIAGNOSIS — I5021 Acute systolic (congestive) heart failure: Secondary | ICD-10-CM | POA: Diagnosis not present

## 2022-08-21 DIAGNOSIS — I160 Hypertensive urgency: Secondary | ICD-10-CM | POA: Diagnosis not present

## 2022-08-21 LAB — BASIC METABOLIC PANEL
Anion gap: 7 (ref 5–15)
BUN: 6 mg/dL (ref 6–20)
CO2: 26 mmol/L (ref 22–32)
Calcium: 7.7 mg/dL — ABNORMAL LOW (ref 8.9–10.3)
Chloride: 102 mmol/L (ref 98–111)
Creatinine, Ser: 0.99 mg/dL (ref 0.61–1.24)
GFR, Estimated: >60 mL/min (ref >60)
Glucose, Bld: 129 mg/dL — ABNORMAL HIGH (ref 70–99)
Potassium: 3.4 mmol/L — ABNORMAL LOW (ref 3.5–5.1)
Sodium: 135 mmol/L (ref 135–145)

## 2022-08-21 LAB — GLUCOSE, CAPILLARY
Glucose-Capillary: 110 mg/dL — ABNORMAL HIGH (ref 70–99)
Glucose-Capillary: 168 mg/dL — ABNORMAL HIGH (ref 70–99)
Glucose-Capillary: 167 mg/dL — ABNORMAL HIGH (ref 70–99)
Glucose-Capillary: 209 mg/dL — ABNORMAL HIGH (ref 70–99)

## 2022-08-21 MED ORDER — POTASSIUM CHLORIDE CRYS ER 20 MEQ PO TBCR
40.0000 meq | EXTENDED_RELEASE_TABLET | Freq: Once | ORAL | Status: AC
  Administered 2022-08-21: 40 meq via ORAL
  Filled 2022-08-21: qty 2

## 2022-08-21 MED ORDER — SPIRONOLACTONE 25 MG PO TABS
25.0000 mg | ORAL_TABLET | Freq: Every day | ORAL | Status: DC
  Administered 2022-08-21: 25 mg via ORAL
  Filled 2022-08-21 (×2): qty 1

## 2022-08-21 NOTE — Plan of Care (Signed)

## 2022-08-21 NOTE — Progress Notes (Signed)
Inpatient Rehabilitation Admissions Coordinator   I await [facility] approval for possible CIR admit.  Ottie Glazier, RN, MSN Rehab Admissions Coordinator [phone] 08/21/2022 5:10 PM

## 2022-08-21 NOTE — Progress Notes (Signed)
PROGRESS NOTE  Alan Tapia.  DOB: 09-11-1975  PCP: Patient, No Pcp Per IRJ:[id number]  DOA: 08/13/2022  LOS: 8 days  Hospital Day: 9  Brief narrative: Alan Colavito. is a 47 y.o. male with PMH significant for morbid obesity, uncontrolled DM, HTN who has a 32-month history of a left foot ulcer for which she was seeing a podiatrist weekly for debridement at [name], last debridement 06/10/2022. He recently moved to [place] due to a family emergency but he was traveling back-and-forth to see the same podiatrist in [name].  He also had a PICC line and was receiving vancomycin infusions.  PICC line fell out before completion of the antibiotic course for which he did not follow-up.   4/3, presented to ED with complaint of progressive swelling, and clear yellow discharge from the foot.  In the ED, patient was afebrile, hemodynamically stable and breathing on room air. MRI left foot showed large plantar foot wound with possible septic arthritis, abscess and worsening osteomyelitis.   EDP discussed with orthopedics 4/5, underwent left BKA by Dr. Lajoyce Corners  Subjective: Patient was seen and examined this morning.   Sitting up in recliner.  Not in distress.  No new symptoms.  Wife at bedside.  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 Lajoyce Corners Wound VAC in place. Initially covered on broad-spectrum IV antibiotics.  Per Dr. Audrie Lia note, infection was clear with amputation and hence antibiotics was stopped after 24 hours of surgery. Currently on pain control with scheduled Tylenol, scheduled oxycodone and as needed Dilaudid.    New diagnosis of systolic CHF Essential hypertension Patient had bilateral lower EXTR lymphedema.  Now with left amputation status continues to have lymphedema on the right. 4/7, echocardiogram obtained which showed a new low EF of 35 to 40% with global  hypokinesis. Cardiology consultation obtained. Medicines being adjusted based on hemodynamics and renal function. Currently on Coreg 12.5 mg twice daily, Lasix 40 mg daily and Entresto 24/26 mg twice daily. Per cardiology, repeat echo in 4 to 6 weeks.  Ischemia evaluation may be needed if EF remains down. Net IO Since Admission: 6,070.86 mL [08/21/22 1338] Continue to monitor for daily intake output, weight, blood pressure, BNP, renal function and electrolytes. Recent Labs  Lab 08/15/22 0309 08/16/22 0215 08/17/22 0323 08/19/22 1033 08/19/22 1739 08/20/22 0707 08/21/22 0203  BUN 6 7 6  <5*  --   --  6  CREATININE 1.18 1.11 1.14 0.97  --  1.27* 0.99  K 3.5 3.4* 3.6 3.3*  --   --  3.4*  MG  --   --   --   --  1.7  --   --     Acute on chronic anemia Baseline hemoglobin between 9 and 10.  Postop drop noted. Hemoglobin remained stable between 8 and 9 for the several days.  Continue to monitor.  Transfuse if less than 7. Recent Labs    08/14/22 0500 08/15/22 0309 08/16/22 0215 08/17/22 0323 08/20/22 0707  HGB 8.7* 8.7* 8.1* 8.0* 8.3*  MCV 71.8* 74.6* 74.2* 73.9* 75.5*    Type 2 diabetes mellitus uncontrolled with hyperglycemia A1c 12.3 in January 2024.  Repeat A1c on 4/3 is better at 10.3. Diabetes care coordinator consulted. Currently on Semglee 10 units daily and sliding scale insulin with Accu-Cheks.  Blood sugar level trending up.  Increased to [facility] to 14 units daily from this morning. Recent Labs  Lab  08/20/22 1031 08/20/22 1637 08/20/22 2122 08/21/22 0825 08/21/22 1120  GLUCAP 173* 134* 160* 110* 168*    Morbid Obesity  Body mass index is 45.52 kg/m. Patient has been advised to make an attempt to improve diet and exercise patterns to aid in weight loss.  Mobility: PT eval obtained.  CIR pending  Goals of care   Code Status: Full Code     DVT prophylaxis: Lovenox   Antimicrobials: Not on antibiotics Fluid: IV fluid stopped Consultants: Orthopedics,  cardiology Family Communication: Wife at bedside  Status: Inpatient Level of care:  Telemetry Medical   Needs to continue in-hospital care:  New diagnosis of CHF.  Pending CIR approval  Patient from: Home Anticipated d/c to: CIR      Diet:  Diet Order             Diet Carb Modified Fluid consistency: Thin; Room service appropriate? Yes  Diet effective now                   Scheduled Meds:  acetaminophen  1,000 mg Oral TID   vitamin C  1,000 mg Oral Daily   carvedilol  12.5 mg Oral BID WC   enoxaparin (LOVENOX) injection  80 mg Subcutaneous Q24H   furosemide  40 mg Oral Daily   insulin aspart  0-5 Units Subcutaneous QHS   insulin aspart  0-9 Units Subcutaneous TID WC   insulin glargine-yfgn  14 Units Subcutaneous Daily   multivitamin with minerals  1 tablet Oral Daily   pantoprazole  40 mg Oral Daily   sacubitril-valsartan  1 tablet Oral BID   spironolactone  25 mg Oral Daily   zinc sulfate  220 mg Oral Daily    PRN meds: alum & mag hydroxide-simeth, bisacodyl, guaiFENesin-dextromethorphan, hydrALAZINE, hydrALAZINE, hydrocortisone cream, HYDROmorphone (DILAUDID) injection, labetalol, LORazepam, LORazepam, magnesium sulfate bolus IVPB, metoprolol tartrate, ondansetron **OR** ondansetron (ZOFRAN) IV, oxyCODONE, phenol, polyethylene glycol   Infusions:   magnesium sulfate bolus IVPB      Antimicrobials: Anti-infectives (From admission, onward)    Start     Dose/Rate Route Frequency Ordered Stop   08/15/22 1225  ceFAZolin (ANCEF) 2-4 GM/100ML-% IVPB  Status:  Discontinued       Note to Pharmacy: Gleason, Ginger E: cabinet override      08/15/22 1225 08/15/22 1246   08/15/22 1156  ceFAZolin (ANCEF) 3-0.9 GM/100ML-% IVPB       Note to Pharmacy: Phebe CollaHOMPSON, LUISA N: cabinet override      08/15/22 1156 08/15/22 2359   08/14/22 1300  ceFAZolin (ANCEF) IVPB 3g/100 mL premix  Status:  Discontinued        3 g 200 mL/hr over 30 Minutes Intravenous On call to O.R.  08/14/22 1128 08/15/22 0559   08/14/22 0215  linezolid (ZYVOX) IVPB 600 mg  Status:  Discontinued       Note to Pharmacy: Patient not tolerating vanc IV even at lower rate, changing to zyvox. Patient denies history of past zyvox reaction. First vanc dose was not fully given.   600 mg 300 mL/hr over 60 Minutes Intravenous Every 12 hours 08/14/22 0124 08/16/22 1120   08/14/22 0200  vancomycin (VANCOREADY) IVPB 2000 mg/400 mL  Status:  Discontinued        2,000 mg 100 mL/hr over 240 Minutes Intravenous Every 12 hours 08/13/22 1750 08/14/22 0225   08/13/22 2200  cefTRIAXone (ROCEPHIN) 2 g in sodium chloride 0.9 % 100 mL IVPB  Status:  Discontinued  2 g 200 mL/hr over 30 Minutes Intravenous Every 24 hours 08/13/22 1646 08/17/22 1202   08/13/22 1345  vancomycin (VANCOREADY) IVPB 2000 mg/400 mL        2,000 mg 200 mL/hr over 120 Minutes Intravenous  Once 08/13/22 1311 08/13/22 1626   08/13/22 1315  metroNIDAZOLE (FLAGYL) IVPB 500 mg  Status:  Discontinued        500 mg 100 mL/hr over 60 Minutes Intravenous Every 12 hours 08/13/22 1307 08/17/22 1202   08/13/22 1315  ceFEPIme (MAXIPIME) 2 g in sodium chloride 0.9 % 100 mL IVPB        2 g 200 mL/hr over 30 Minutes Intravenous  Once 08/13/22 1311 08/13/22 1406       Nutritional status:  Body mass index is 45.52 kg/m.  Nutrition Problem: Increased nutrient needs Etiology: wound healing, post-op healing Signs/Symptoms: estimated needs     Objective: Vitals:   08/21/22 0333 08/21/22 0829  BP:  (!) 161/93  Pulse: 74 80  Resp:    Temp: 98.2 F (36.8 C) 97.7 F (36.5 C)  SpO2: 99% 100%    Intake/Output Summary (Last 24 hours) at 08/21/2022 1338 Last data filed at 08/20/2022 2130 Gross per 24 hour  Intake 240 ml  Output --  Net 240 ml    Filed Weights   08/13/22 1206 08/15/22 1210  Weight: (!) 169.6 kg (!) 169.6 kg   Weight change:  Body mass index is 45.52 kg/m.   Physical Exam: General exam: Pleasant, middle-aged  African-American male. Skin: No rashes, lesions or ulcers. HEENT: Atraumatic, normocephalic, no obvious bleeding Lungs: Clear to auscultation bilaterally CVS: Regular rate and rhythm, no murmur GI/Abd soft, nontender, nondistended, bowel sound present. CNS: Alert, awake, oriented x 3 Psychiatry: Mood appropriate Extremities: Gradually improving right lower extremity edema.  Left lower extremity BKA status.  On boot and wound VAC  Data Review: I have personally reviewed the laboratory data and studies available.  F/u labs ordered Unresulted Labs (From admission, onward)     Start     Ordered   08/27/22 0500  CBC  Weekly,   R     Question:  Specimen collection method  Answer:  Lab=Lab collect   08/20/22 1308   08/21/22 0500  Basic metabolic panel  Daily,   R     Question:  Specimen collection method  Answer:  Lab=Lab collect   08/20/22 1052   08/20/22 0500  Creatinine, serum  (enoxaparin (LOVENOX)    CrCl >/= 30 ml/min)  Weekly,   R     Comments: while on enoxaparin therapy    08/13/22 1846            Total time spent in review of labs and imaging, patient evaluation, formulation of plan, documentation and communication with family: 35 minutes  Signed, Lorin Glass, MD [facility] 08/21/2022

## 2022-08-21 NOTE — Progress Notes (Signed)
Nutrition Follow-up  DOCUMENTATION CODES:   Not applicable  INTERVENTION:  Continue to encourage adequate PO intake  Double protein portions with all meals MVI with minerals daily Protein containing snacks TID between meals   Reinforced DM nutrition recommendations and addressed questions and concerns  NUTRITION DIAGNOSIS:   Increased nutrient needs related to wound healing, post-op healing as evidenced by estimated needs. - ongoing  GOAL:   Patient will meet greater than or equal to 90% of their needs - progressing  MONITOR:   PO intake, Supplement acceptance, Labs, Weight trends, Skin  REASON FOR ASSESSMENT:   Consult Wound healing  ASSESSMENT:   47 year old male who presented to the ED on 4/03 with wound infection of the L foot. PMH of chronic osteomyelitis, DM, HTN. Pt admitted with osteomyelitis and abscess.  4/5 - s/p L BKA  Pt sitting in recliner at time of visit with wife present. He states that his appetite has been fair. Some days are better than others. He states that sometimes he will take 1 bite for taste and then force himself to continue to have a few more bites for the nutritional intake. He is appreciative of the ongoing double protein portions. He is trying to snack between meals to help with blood sugar control and often finds he is hungry over night. He   Pt states that yesterday he had some nausea and vomiting which is improved today. Today he reports that he has had multiple episodes of diarrhea d/t medication changes.   Though he states that he is lactose intolerant, he reports that he can tolerate skim and 2% milk.   Meal completions: 4/6: 100% lunch 4/7: 50% breakfast, 100% lunch 4/9: 75% breakfast, 100% dinner   Edema: mild pitting generalized, LUE and BLE  Medications: Vitamin C 1000mg  daily, colace, lasix 40mg  daily, SSI 0-5 units qhs, SSI 0-9 TID, semglee 14 units daily, MVI, protonix, klor-con, zinc 220mg  (4/5-4/19)  Labs: potassium  3.4, Ca 7.7, CBG's 110-173 x24 hours  Diet Order:   Diet Order             Diet Carb Modified Fluid consistency: Thin; Room service appropriate? Yes  Diet effective now                   EDUCATION NEEDS:   Education needs have been addressed  Skin:  Skin Assessment: Skin Integrity Issues: Skin Integrity Issues:: Wound VAC Wound Vac: L leg Other: osteomyelitis/abscess left foot  Last BM:  4/10  Height:   Ht Readings from Last 1 Encounters:  08/15/22 6\' 4"  (1.93 m)    Weight:   Wt Readings from Last 1 Encounters:  08/15/22 (!) 169.6 kg    Ideal Body Weight:  91.8 kg  BMI:  Body mass index is 45.52 kg/m.  Estimated Nutritional Needs:   Kcal:  2400-2600  Protein:  120-140 grams  Fluid:  >2.2 L  Drusilla Kanner, RDN, LDN Clinical Nutrition

## 2022-08-21 NOTE — Progress Notes (Signed)
Rounding Note    Patient Name: Alan Tapia. Date of Encounter: 08/21/2022  [facility] Cardiologist: None   Subjective   Having a lot of GI upset this morning. Has been in and out of the bathroom all morning.   Cr back to 0.99 today BP elevated 150-160 Started on spironolactone  Inpatient Medications    Scheduled Meds:  acetaminophen  1,000 mg Oral TID   vitamin C  1,000 mg Oral Daily   carvedilol  12.5 mg Oral BID WC   docusate sodium  100 mg Oral Daily   enoxaparin (LOVENOX) injection  80 mg Subcutaneous Q24H   furosemide  40 mg Oral Daily   insulin aspart  0-5 Units Subcutaneous QHS   insulin aspart  0-9 Units Subcutaneous TID WC   insulin glargine-yfgn  14 Units Subcutaneous Daily   multivitamin with minerals  1 tablet Oral Daily   pantoprazole  40 mg Oral Daily   sacubitril-valsartan  1 tablet Oral BID   spironolactone  25 mg Oral Daily   zinc sulfate  220 mg Oral Daily   Continuous Infusions:  magnesium sulfate bolus IVPB     PRN Meds: alum & mag hydroxide-simeth, bisacodyl, guaiFENesin-dextromethorphan, hydrALAZINE, hydrALAZINE, hydrocortisone cream, HYDROmorphone (DILAUDID) injection, labetalol, LORazepam, LORazepam, magnesium citrate, magnesium sulfate bolus IVPB, metoprolol tartrate, ondansetron **OR** ondansetron (ZOFRAN) IV, oxyCODONE, phenol, polyethylene glycol   Vital Signs    Vitals:   08/19/22 2100 08/20/22 2019 08/21/22 0333 08/21/22 0829  BP: (!) 158/80 (!) 156/86  (!) 161/93  Pulse: 77  74 80  Resp:  16    Temp: 97.6 F (36.4 C) 98.2 F (36.8 C) 98.2 F (36.8 C) 97.7 F (36.5 C)  TempSrc: Oral  Oral Oral  SpO2: 99% 100% 99% 100%  Weight:      Height:        Intake/Output Summary (Last 24 hours) at 08/21/2022 1035 Last data filed at 08/20/2022 2130 Gross per 24 hour  Intake 240 ml  Output --  Net 240 ml       08/15/2022   12:10 PM 08/13/2022   12:06 PM 06/06/2022    1:30 PM  Last 3 Weights  Weight (lbs) 374 lb 374  lb 375 lb  Weight (kg) 169.645 kg 169.645 kg 170.099 kg      Telemetry    NSR - Personally Reviewed  ECG    No new tracing - Personally Reviewed  Physical Exam   GEN: NAD Neck: No JVD Cardiac: RRR, no murmurs  Respiratory: Clear to auscultation bilaterally. GI: Obese, soft MS: Left BKA, 1-2+ RLE edema (chronic), warm Neuro:  Nonfocal  Psych: Normal affect   Labs    High Sensitivity Troponin:  No results for input(s): "TROPONINIHS" in the last 720 hours.   Chemistry Recent Labs  Lab 08/17/22 0323 08/19/22 1033 08/19/22 1739 08/20/22 0707 08/21/22 0203  NA 136 136  --   --  135  K 3.6 3.3*  --   --  3.4*  CL 103 106  --   --  102  CO2 25 25  --   --  26  GLUCOSE 142* 171*  --   --  129*  BUN 6 <5*  --   --  6  CREATININE 1.14 0.97  --  1.27* 0.99  CALCIUM 7.6* 7.5*  --   --  7.7*  MG  --   --  1.7  --   --   GFRNONAA >60 >60  --  >  60 >60  ANIONGAP 8 5  --   --  7     Lipids No results for input(s): "CHOL", "TRIG", "HDL", "LABVLDL", "LDLCALC", "CHOLHDL" in the last 168 hours.  Hematology Recent Labs  Lab 08/16/22 0215 08/17/22 0323 08/20/22 0707  WBC 8.6 8.1 6.6  RBC 3.56* 3.49* 3.64*  HGB 8.1* 8.0* 8.3*  HCT 26.4* 25.8* 27.5*  MCV 74.2* 73.9* 75.5*  MCH 22.8* 22.9* 22.8*  MCHC 30.7 31.0 30.2  RDW 15.2 15.6* 16.8*  PLT 367 397 423*    Thyroid No results for input(s): "TSH", "FREET4" in the last 168 hours.  BNPNo results for input(s): "BNP", "PROBNP" in the last 168 hours.  DDimer No results for input(s): "DDIMER" in the last 168 hours.   Radiology    No results found.  Cardiac Studies   Echo from 08/17/22:     1. Left ventricular ejection fraction, by estimation, is 35 to 40%. The  left ventricle has moderately decreased function. The left ventricle  demonstrates global hypokinesis. The left ventricular internal cavity size  was mildly dilated. Indeterminate  diastolic filling due to E-A fusion.   2. Right ventricular systolic function is  normal. The right ventricular  size is normal. Tricuspid regurgitation signal is inadequate for assessing  PA pressure.   3. Left atrial size was mildly dilated.   4. The mitral valve is grossly normal. Mild mitral valve regurgitation.   5. The aortic valve is tricuspid. Aortic valve regurgitation is not  visualized.   6. The inferior vena cava is dilated in size with <50% respiratory  variability, suggesting right atrial pressure of 15 mmHg.   Patient Profile     47 y.o. male  with history of poorly controlled DMII, uncontrolled HTN, lymphedema, chronic left foot diabetic ulcer, morbid obesity and medication noncompliance who presented with worsening left lower extremity ulcer swelling and draining found to have osteomyelitis/septic arthritis and ultimately underwent BKA. TTE this admission that was obtained for LE edema showed EF 35-40% global hypokinesis, mild LV dilation, indeterminate diastolic parameters, normal RV, mild LAE, mild MR. Cardiology is now consulted for newly diagnosed systolic HF.   Assessment & Plan    #Newly Diagnosed Acute Systolic HF: -TTE this admission with LVEF 35-40% from 50-55% in 12/2021 -No ischemic symptoms but patient at high risk for CAD  -May also be related to uncontrolled HTN or stress induced CM in the setting of sepsis -Will continue to optimize GDMT as tolerated with plans for possible ischemic work-up as outpatient if EF remains depressed -Continue lasix 40mg  PO daily -Continue coreg 12.5mg  BID for now due to nausea; this may also be related to oxycodone -Continue entresto 24/26mg  BID -Agree with spironolactone 25mg  daily -Check daily BMET while adjusting meds -Not adding SGLT2i until DM better controlled  #HTN: -Remains elevated in 150s -Keep coreg at 12.5mg  BID for now due to nausea -Start spiro 25mg  daily -If BP remains elevated tomorrow; recommend increasing entresto to 49/51mg  BID  #Chronic left foot diabetic ulcer with acute on chronic  osteomyelitis s/p left BKA  -Management per primary team  #Uncontrolled type II DM: -Per primary team  Cardiology will sign-off. Will arrange for outpatient follow-up.       For questions or updates, please contact [facility] Please consult [email or web address] for contact info under        Signed, Meriam Sprague, MD  08/21/2022, 10:35 AM

## 2022-08-21 NOTE — Progress Notes (Signed)
   [name]   PCP: Patient, No Pcp Per PCP-Cardiologist: None    HPI:  47 yo M with PMH of chronic osteomyelitis, T2DM, and HTN.   Presented to the ED on 4/3 with L foot infection. Reports he was being treated for osteomyelitis in [name] but PICC has fallen out and he was unable to get vancomycin for 4 weeks. Recently moved to [facility]. In the ED, MRI showed septic arthritis with possible abscess and continued/worsening osteomyelitis. Ortho consulted and L BKA was done on 4/5. Continued LE edema on R leg. ECHO on 4/7 showed EF reduced to 35-40%, global hypokinesis, RV normal. Cardiology consulted. Possible CIR at discharge.   Current HF Medications: Diuretic: furosemide 40 mg PO daily Beta Blocker: carvedilol 12.5 mg BID ACE/ARB/ARNI: Entresto 24/26 mg BID  Prior to admission HF Medications: None  Pertinent Lab Values: Serum creatinine 0.99, BUN 6, Potassium 3.4, Sodium 135, Magnesium 1.7, A1c 10.3   Vital Signs: Weight: 374 lbs Blood pressure: 160/90s  Heart rate: 70-90s  I/O: incomplete  Medication Assistance / Insurance Benefits Check: Does the patient have prescription insurance?  Yes Type of insurance plan: out of state Medicaid > BCBS commercial plan starting 5/1  Outpatient Pharmacy:  Prior to admission outpatient [facility]: [facility] Is the patient willing to use [facility] TOC pharmacy at discharge? Yes Is the patient willing to transition their outpatient pharmacy to utilize a [facility] outpatient pharmacy?   Pending    Assessment: 1. Acute systolic CHF (LVEF 35-40%). NYHA class II symptoms. - Continue furosemide 40 mg PO daily. Strict I/Os and daily weights. Keep K>4 and Mg>2.  - Continue carvedilol 12.5 mg BID - Continue Entresto 24/26 mg BID  - Consider adding spironolactone 25 mg daily today with improvement in renal function - Caution SGLT2i with A1c >10   Plan: 1) Medication changes recommended at this time: - Start  spironolactone 25 mg daily  2) Patient assistance: - Has out of state Medicaid  - Case management discussed with patient and his wife - he has a Insurance underwriter starting on 5/1  3)  Education  - To be completed prior to discharge  Sharen Hones, PharmD, BCPS Heart Failure Engineer, building services Phone [phone]

## 2022-08-21 NOTE — Progress Notes (Signed)
Physical Therapy Treatment Patient Details Name: Alan Tapia. MRN: [id number] DOB: 1976/05/09 Today's Date: 08/21/2022   History of Present Illness Pt is a 47 y.o. male admitted 4/3 with 6 mo history of L foot ulcer; Presenting with swelling and progressive yellow discharge from the foot. 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 PMH significant for DMII, HTN.    PT Comments    Further progression towards independence and safety with transfers and basic mobility. Min assist approaching min guard level with squat pivot transfers to drop arm recliner and back to bed. Unsuccessful attempt to stand a couple of times with RW for support from elevated bed surface (likely secondary to fatigue from transfer training immediately prior,) certainly putting forth great effort with training. Reviewed LE exercises and precautions. Patient will continue to benefit from skilled physical therapy services to further improve independence with functional mobility.  Patient will benefit from intensive inpatient follow up therapy, >3 hours/day    Recommendations for follow up therapy are one component of a multi-disciplinary discharge planning process, led by the attending physician.  Recommendations may be updated based on patient status, additional functional criteria and insurance authorization.  Follow Up Recommendations  Can patient physically be transported by private vehicle: No    Assistance Recommended at Discharge Frequent or constant Supervision/Assistance  Patient can return home with the following Two people to help with walking and/or transfers;Two people to help with bathing/dressing/bathroom;Assistance with cooking/housework;Assist for transportation;Help with stairs or ramp for entrance   Equipment Recommendations  None recommended by PT    Recommendations for Other Services Rehab consult     Precautions / Restrictions Precautions Precautions: Fall;Knee Precaution Booklet Issued: Yes (comment) (Reviewed  BKA precautions) Precaution Comments: Reviewed prec Required Braces or Orthoses: Other Brace Other Brace: LLE brace (Ampushield LLE.) Restrictions Weight Bearing Restrictions: Yes LLE Weight Bearing: Non weight bearing     Mobility  Bed Mobility Overal bed mobility: Needs Assistance Bed Mobility: Supine to Sit, Sit to Supine     Supine to sit: Supervision Sit to supine: Supervision   General bed mobility comments: Making transitions to EOB easier today, still heavily reliant on rail but without physical intervention today. Cues for safety and awareness of Lt residual limb.    Transfers Overall transfer level: Needs assistance Equipment used: Rolling walker (2 wheels) Transfers: Sit to/from Stand Sit to Stand: Max assist, From elevated surface     Squat pivot transfers: Min assist     General transfer comment: Initial focus on squat pivot transfer techniques with min assist for blocking drop arm chair with transitions and LLE support to maintain NWB precautions with return to bed. Pt places LLE onto bed to scoot towards left back into bed. Cues for anterior weight shift to better clear buttocks. After transfer training in this manner pt attempted sit<>stand from bed, moderately elevated however required max assist for boost and balance, RLE weak, likely fatigued from prior work. Attempted without full rise twice.    Ambulation/Gait                   Stairs             Wheelchair Mobility    Modified Rankin (Stroke Patients Only)       Balance Overall balance assessment: Needs assistance Sitting-balance support: No upper extremity supported, Feet supported Sitting balance-Leahy Scale: Good  Cognition Arousal/Alertness: Awake/alert Behavior During Therapy: WFL for tasks assessed/performed Overall Cognitive Status: Within Functional Limits for tasks assessed                                           Exercises General Exercises - Lower Extremity Long Arc Quad: Strengthening, Both, 10 reps, Seated (against gravity on Lt, with manual resistance on Rt. Pt hold 3 sec) Amputee Exercises Quad Sets: AROM, Both, 10 reps, Seated Towel Squeeze: Strengthening, 15 reps, Seated, Both Hip Extension: AROM, Left, 5 reps, Standing Hip ABduction/ADduction: Strengthening, 15 reps, Left, Sidelying Knee Flexion: AROM, Left, 10 reps, Seated Straight Leg Raises: Strengthening, Left, 10 reps, Supine    General Comments        Pertinent Vitals/Pain Pain Assessment Pain Assessment: Faces Faces Pain Scale: Hurts little more Pain Location: Residual limb Lt Pain Descriptors / Indicators: Aching (+ phantom pain) Pain Intervention(s): Monitored during session, Repositioned    Home Living                          Prior Function            PT Goals (current goals can now be found in the care plan section) Acute Rehab PT Goals Patient Stated Goal: get well, go to rehab. PT Goal Formulation: With patient/family Time For Goal Achievement: 08/30/22 Potential to Achieve Goals: Good Progress towards PT goals: Progressing toward goals    Frequency    Min 4X/week      PT Plan Current plan remains appropriate    Co-evaluation              AM-PAC PT "6 Clicks" Mobility   Outcome Measure  Help needed turning from your back to your side while in a flat bed without using bedrails?: A Little Help needed moving from lying on your back to sitting on the side of a flat bed without using bedrails?: A Little Help needed moving to and from a bed to a chair (including a wheelchair)?: A Little Help needed standing up from a chair using your arms (e.g., wheelchair or bedside chair)?: Total Help needed to walk in hospital room?: Total Help needed climbing 3-5 steps with a railing? : Total 6 Click Score: 12    End of Session Equipment Utilized During Treatment: Gait  belt Activity Tolerance: Patient tolerated treatment well Patient left: with call bell/phone within reach;with bed alarm set;in bed;with family/visitor present Nurse Communication: Mobility status PT Visit Diagnosis: Unsteadiness on feet (R26.81);Muscle weakness (generalized) (M62.81);Difficulty in walking, not elsewhere classified (R26.2);Other abnormalities of gait and mobility (R26.89);Pain Pain - Right/Left: Left Pain - part of body: Leg     Time: 1540-1600 PT Time Calculation (min) (ACUTE ONLY): 20 min  Charges:  $Therapeutic Activity: 8-22 mins                     Kathlyn Sacramento, PT, DPT Physical Therapist Acute Rehabilitation Services [facility] Outpatient Rehabilitation Services [facility]    Berton Mount 08/21/2022, 4:28 PM

## 2022-08-22 ENCOUNTER — Inpatient Hospital Stay (HOSPITAL_COMMUNITY)
Admission: RE | Admit: 2022-08-22 | Discharge: 2022-09-10 | DRG: 560 | Disposition: A | Discharge status: Home Health | Payer: Medicaid - Out of State | Source: Intra-hospital | Attending: Physical Medicine & Rehabilitation | Admitting: Physical Medicine & Rehabilitation

## 2022-08-22 ENCOUNTER — Other Ambulatory Visit: Payer: Self-pay

## 2022-08-22 ENCOUNTER — Encounter (HOSPITAL_COMMUNITY): Payer: Self-pay | Admitting: Physical Medicine & Rehabilitation

## 2022-08-22 DIAGNOSIS — D62 Acute posthemorrhagic anemia: Secondary | ICD-10-CM | POA: Diagnosis not present

## 2022-08-22 DIAGNOSIS — E876 Hypokalemia: Secondary | ICD-10-CM | POA: Diagnosis present

## 2022-08-22 DIAGNOSIS — Z881 Allergy status to other antibiotic agents status: Secondary | ICD-10-CM

## 2022-08-22 DIAGNOSIS — Z89512 Acquired absence of left leg below knee: Secondary | ICD-10-CM

## 2022-08-22 DIAGNOSIS — R931 Abnormal findings on diagnostic imaging of heart and coronary circulation: Secondary | ICD-10-CM

## 2022-08-22 DIAGNOSIS — Z4781 Encounter for orthopedic aftercare following surgical amputation: Secondary | ICD-10-CM | POA: Diagnosis not present

## 2022-08-22 DIAGNOSIS — K5900 Constipation, unspecified: Secondary | ICD-10-CM | POA: Diagnosis not present

## 2022-08-22 DIAGNOSIS — R079 Chest pain, unspecified: Secondary | ICD-10-CM | POA: Diagnosis not present

## 2022-08-22 DIAGNOSIS — I509 Heart failure, unspecified: Secondary | ICD-10-CM | POA: Diagnosis not present

## 2022-08-22 DIAGNOSIS — I10 Essential (primary) hypertension: Secondary | ICD-10-CM | POA: Diagnosis not present

## 2022-08-22 DIAGNOSIS — E11649 Type 2 diabetes mellitus with hypoglycemia without coma: Secondary | ICD-10-CM | POA: Diagnosis not present

## 2022-08-22 DIAGNOSIS — I890 Lymphedema, not elsewhere classified: Secondary | ICD-10-CM | POA: Diagnosis present

## 2022-08-22 DIAGNOSIS — K5901 Slow transit constipation: Secondary | ICD-10-CM | POA: Diagnosis not present

## 2022-08-22 DIAGNOSIS — R7989 Other specified abnormal findings of blood chemistry: Secondary | ICD-10-CM

## 2022-08-22 DIAGNOSIS — Z888 Allergy status to other drugs, medicaments and biological substances status: Secondary | ICD-10-CM

## 2022-08-22 DIAGNOSIS — N179 Acute kidney failure, unspecified: Secondary | ICD-10-CM | POA: Diagnosis not present

## 2022-08-22 DIAGNOSIS — G548 Other nerve root and plexus disorders: Secondary | ICD-10-CM | POA: Diagnosis not present

## 2022-08-22 DIAGNOSIS — M79605 Pain in left leg: Secondary | ICD-10-CM | POA: Diagnosis not present

## 2022-08-22 DIAGNOSIS — Z91013 Allergy to seafood: Secondary | ICD-10-CM

## 2022-08-22 DIAGNOSIS — M62838 Other muscle spasm: Secondary | ICD-10-CM | POA: Diagnosis not present

## 2022-08-22 DIAGNOSIS — I5023 Acute on chronic systolic (congestive) heart failure: Secondary | ICD-10-CM | POA: Diagnosis present

## 2022-08-22 DIAGNOSIS — I5021 Acute systolic (congestive) heart failure: Secondary | ICD-10-CM

## 2022-08-22 DIAGNOSIS — E11621 Type 2 diabetes mellitus with foot ulcer: Secondary | ICD-10-CM | POA: Diagnosis not present

## 2022-08-22 DIAGNOSIS — Z794 Long term (current) use of insulin: Secondary | ICD-10-CM | POA: Diagnosis not present

## 2022-08-22 DIAGNOSIS — I420 Dilated cardiomyopathy: Secondary | ICD-10-CM | POA: Diagnosis not present

## 2022-08-22 DIAGNOSIS — R739 Hyperglycemia, unspecified: Secondary | ICD-10-CM | POA: Diagnosis not present

## 2022-08-22 DIAGNOSIS — G4700 Insomnia, unspecified: Secondary | ICD-10-CM | POA: Diagnosis not present

## 2022-08-22 DIAGNOSIS — Z6841 Body Mass Index (BMI) 40.0 and over, adult: Secondary | ICD-10-CM | POA: Diagnosis not present

## 2022-08-22 DIAGNOSIS — M86272 Subacute osteomyelitis, left ankle and foot: Secondary | ICD-10-CM | POA: Diagnosis not present

## 2022-08-22 DIAGNOSIS — S88112D Complete traumatic amputation at level between knee and ankle, left lower leg, subsequent encounter: Secondary | ICD-10-CM

## 2022-08-22 DIAGNOSIS — S88112S Complete traumatic amputation at level between knee and ankle, left lower leg, sequela: Secondary | ICD-10-CM | POA: Diagnosis not present

## 2022-08-22 DIAGNOSIS — R197 Diarrhea, unspecified: Secondary | ICD-10-CM | POA: Diagnosis not present

## 2022-08-22 DIAGNOSIS — E1165 Type 2 diabetes mellitus with hyperglycemia: Secondary | ICD-10-CM

## 2022-08-22 DIAGNOSIS — E871 Hypo-osmolality and hyponatremia: Secondary | ICD-10-CM | POA: Diagnosis present

## 2022-08-22 DIAGNOSIS — R0989 Other specified symptoms and signs involving the circulatory and respiratory systems: Secondary | ICD-10-CM | POA: Diagnosis not present

## 2022-08-22 DIAGNOSIS — Z79899 Other long term (current) drug therapy: Secondary | ICD-10-CM | POA: Diagnosis not present

## 2022-08-22 DIAGNOSIS — R52 Pain, unspecified: Secondary | ICD-10-CM

## 2022-08-22 DIAGNOSIS — Z713 Dietary counseling and surveillance: Secondary | ICD-10-CM

## 2022-08-22 DIAGNOSIS — D649 Anemia, unspecified: Secondary | ICD-10-CM | POA: Diagnosis not present

## 2022-08-22 DIAGNOSIS — Z833 Family history of diabetes mellitus: Secondary | ICD-10-CM

## 2022-08-22 DIAGNOSIS — M7989 Other specified soft tissue disorders: Secondary | ICD-10-CM | POA: Diagnosis not present

## 2022-08-22 DIAGNOSIS — I110 Hypertensive heart disease with heart failure: Secondary | ICD-10-CM | POA: Diagnosis present

## 2022-08-22 DIAGNOSIS — F4320 Adjustment disorder, unspecified: Secondary | ICD-10-CM | POA: Diagnosis not present

## 2022-08-22 DIAGNOSIS — G546 Phantom limb syndrome with pain: Secondary | ICD-10-CM | POA: Diagnosis not present

## 2022-08-22 DIAGNOSIS — Z8249 Family history of ischemic heart disease and other diseases of the circulatory system: Secondary | ICD-10-CM

## 2022-08-22 DIAGNOSIS — S88112A Complete traumatic amputation at level between knee and ankle, left lower leg, initial encounter: Principal | ICD-10-CM | POA: Diagnosis present

## 2022-08-22 DIAGNOSIS — Z792 Long term (current) use of antibiotics: Secondary | ICD-10-CM

## 2022-08-22 DIAGNOSIS — L97509 Non-pressure chronic ulcer of other part of unspecified foot with unspecified severity: Secondary | ICD-10-CM | POA: Diagnosis not present

## 2022-08-22 DIAGNOSIS — Z8261 Family history of arthritis: Secondary | ICD-10-CM

## 2022-08-22 DIAGNOSIS — Z5329 Procedure and treatment not carried out because of patient's decision for other reasons: Secondary | ICD-10-CM | POA: Diagnosis not present

## 2022-08-22 LAB — BASIC METABOLIC PANEL
Anion gap: 7 (ref 5–15)
BUN: 6 mg/dL (ref 6–20)
CO2: 24 mmol/L (ref 22–32)
Calcium: 7.8 mg/dL — ABNORMAL LOW (ref 8.9–10.3)
Chloride: 106 mmol/L (ref 98–111)
Creatinine, Ser: 0.94 mg/dL (ref 0.61–1.24)
GFR, Estimated: >60 mL/min (ref >60)
Glucose, Bld: 174 mg/dL — ABNORMAL HIGH (ref 70–99)
Potassium: 3.9 mmol/L (ref 3.5–5.1)
Sodium: 137 mmol/L (ref 135–145)

## 2022-08-22 LAB — GLUCOSE, CAPILLARY
Glucose-Capillary: 141 mg/dL — ABNORMAL HIGH (ref 70–99)
Glucose-Capillary: 144 mg/dL — ABNORMAL HIGH (ref 70–99)
Glucose-Capillary: 166 mg/dL — ABNORMAL HIGH (ref 70–99)
Glucose-Capillary: 193 mg/dL — ABNORMAL HIGH (ref 70–99)

## 2022-08-22 MED ORDER — INSULIN ASPART 100 UNIT/ML IJ SOLN: 0.0000 [IU] | Freq: Every day | INTRAMUSCULAR | 11 refills | Status: DC

## 2022-08-22 MED ORDER — ZINC SULFATE 220 (50 ZN) MG PO CAPS
220.0000 mg | ORAL_CAPSULE | Freq: Every day | ORAL | Status: AC
  Administered 2022-08-23 – 2022-08-28 (×6): 220 mg via ORAL
  Filled 2022-08-22 (×6): qty 1

## 2022-08-22 MED ORDER — ACETAMINOPHEN 325 MG PO TABS
650.0000 mg | ORAL_TABLET | Freq: Three times a day (TID) | ORAL | Status: DC
  Administered 2022-08-22 – 2022-09-10 (×55): 650 mg via ORAL
  Filled 2022-08-22 (×55): qty 2

## 2022-08-22 MED ORDER — SACUBITRIL-VALSARTAN 49-51 MG PO TABS: 1.0000 | ORAL_TABLET | Freq: Two times a day (BID) | ORAL | Status: DC

## 2022-08-22 MED ORDER — ACETAMINOPHEN 500 MG PO TABS: 1000.0000 mg | ORAL_TABLET | Freq: Three times a day (TID) | ORAL | 0 refills | Status: DC

## 2022-08-22 MED ORDER — INSULIN GLARGINE-YFGN 100 UNIT/ML ~~LOC~~ SOLN: 15.0000 [IU] | Freq: Every day | SUBCUTANEOUS | 11 refills | Status: DC

## 2022-08-22 MED ORDER — METHOCARBAMOL 500 MG PO TABS
500.0000 mg | ORAL_TABLET | Freq: Four times a day (QID) | ORAL | Status: DC | PRN
  Administered 2022-08-23 – 2022-08-26 (×3): 500 mg via ORAL
  Filled 2022-08-22 (×3): qty 1

## 2022-08-22 MED ORDER — TRAMADOL HCL 50 MG PO TABS
50.0000 mg | ORAL_TABLET | Freq: Four times a day (QID) | ORAL | Status: DC | PRN
  Administered 2022-08-25 – 2022-09-04 (×3): 50 mg via ORAL
  Filled 2022-08-22 (×4): qty 1

## 2022-08-22 MED ORDER — ADULT MULTIVITAMIN W/MINERALS CH
1.0000 | ORAL_TABLET | Freq: Every day | ORAL | Status: DC
  Administered 2022-08-23 – 2022-09-10 (×19): 1 via ORAL
  Filled 2022-08-22 (×19): qty 1

## 2022-08-22 MED ORDER — ADULT MULTIVITAMIN W/MINERALS CH: 1.0000 | ORAL_TABLET | Freq: Every day | ORAL | Status: DC

## 2022-08-22 MED ORDER — SPIRONOLACTONE 25 MG PO TABS: 25.0000 mg | ORAL_TABLET | Freq: Every day | ORAL | Status: DC

## 2022-08-22 MED ORDER — HYDROCORTISONE 1 % EX CREA: 1.0000 | TOPICAL_CREAM | Freq: Two times a day (BID) | CUTANEOUS | Status: DC | PRN

## 2022-08-22 MED ORDER — SACUBITRIL-VALSARTAN 24-26 MG PO TABS
1.0000 | ORAL_TABLET | Freq: Once | ORAL | Status: AC
  Administered 2022-08-22: 1 via ORAL
  Filled 2022-08-22: qty 1

## 2022-08-22 MED ORDER — SPIRONOLACTONE 25 MG PO TABS
25.0000 mg | ORAL_TABLET | Freq: Every day | ORAL | Status: DC
  Administered 2022-08-23 – 2022-09-02 (×11): 25 mg via ORAL
  Filled 2022-08-22 (×11): qty 1

## 2022-08-22 MED ORDER — SACUBITRIL-VALSARTAN 49-51 MG PO TABS
1.0000 | ORAL_TABLET | Freq: Two times a day (BID) | ORAL | Status: DC
  Filled 2022-08-22: qty 1

## 2022-08-22 MED ORDER — BISACODYL 10 MG RE SUPP: 10.0000 mg | Freq: Every day | RECTAL | Status: DC | PRN

## 2022-08-22 MED ORDER — OXYCODONE HCL 5 MG PO TABS
5.0000 mg | ORAL_TABLET | ORAL | Status: DC | PRN
  Administered 2022-08-22 – 2022-09-09 (×15): 5 mg via ORAL
  Filled 2022-08-22: qty 1
  Filled 2022-08-22 (×2): qty 2
  Filled 2022-08-22 (×2): qty 1
  Filled 2022-08-22: qty 2
  Filled 2022-08-22: qty 1
  Filled 2022-08-22: qty 2
  Filled 2022-08-22: qty 1
  Filled 2022-08-22 (×2): qty 2
  Filled 2022-08-22: qty 1
  Filled 2022-08-22: qty 2
  Filled 2022-08-22 (×3): qty 1
  Filled 2022-08-22: qty 2
  Filled 2022-08-22 (×2): qty 1

## 2022-08-22 MED ORDER — CARVEDILOL 12.5 MG PO TABS: 12.5000 mg | ORAL_TABLET | Freq: Two times a day (BID) | ORAL | Status: DC

## 2022-08-22 MED ORDER — INSULIN ASPART 100 UNIT/ML IJ SOLN: 0.0000 [IU] | Freq: Every day | INTRAMUSCULAR | Status: DC

## 2022-08-22 MED ORDER — POLYETHYLENE GLYCOL 3350 17 G PO PACK: 17.0000 g | PACK | Freq: Every day | ORAL | 0 refills | Status: DC | PRN

## 2022-08-22 MED ORDER — PROCHLORPERAZINE MALEATE 5 MG PO TABS: 5.0000 mg | ORAL_TABLET | Freq: Four times a day (QID) | ORAL | Status: DC | PRN

## 2022-08-22 MED ORDER — GUAIFENESIN-DM 100-10 MG/5ML PO SYRP
5.0000 mL | ORAL_SOLUTION | Freq: Four times a day (QID) | ORAL | Status: DC | PRN
  Filled 2022-08-22: qty 10

## 2022-08-22 MED ORDER — BISACODYL 5 MG PO TBEC: 5.0000 mg | DELAYED_RELEASE_TABLET | Freq: Every day | ORAL | 0 refills | Status: DC | PRN

## 2022-08-22 MED ORDER — ENOXAPARIN SODIUM 80 MG/0.8ML IJ SOSY
80.0000 mg | PREFILLED_SYRINGE | INTRAMUSCULAR | Status: DC
  Administered 2022-08-22 – 2022-08-26 (×5): 80 mg via SUBCUTANEOUS
  Filled 2022-08-22 (×6): qty 0.8

## 2022-08-22 MED ORDER — INSULIN ASPART 100 UNIT/ML IJ SOLN: 0.0000 [IU] | Freq: Three times a day (TID) | INTRAMUSCULAR | 11 refills | Status: DC

## 2022-08-22 MED ORDER — INSULIN GLARGINE-YFGN 100 UNIT/ML ~~LOC~~ SOLN
14.0000 [IU] | Freq: Every day | SUBCUTANEOUS | Status: DC
  Administered 2022-08-23 – 2022-09-04 (×13): 14 [IU] via SUBCUTANEOUS
  Administered 2022-09-05: 12 [IU] via SUBCUTANEOUS
  Filled 2022-08-22 (×14): qty 0.14

## 2022-08-22 MED ORDER — ACETAMINOPHEN 325 MG PO TABS
325.0000 mg | ORAL_TABLET | ORAL | Status: DC | PRN
  Administered 2022-08-23 – 2022-09-08 (×4): 650 mg via ORAL
  Filled 2022-08-22 (×5): qty 2

## 2022-08-22 MED ORDER — VITAMIN C 500 MG PO TABS
1000.0000 mg | ORAL_TABLET | Freq: Every day | ORAL | Status: DC
  Administered 2022-08-23 – 2022-09-10 (×19): 1000 mg via ORAL
  Filled 2022-08-22 (×19): qty 2

## 2022-08-22 MED ORDER — TRAZODONE HCL 50 MG PO TABS
25.0000 mg | ORAL_TABLET | Freq: Every evening | ORAL | Status: DC | PRN
  Administered 2022-08-26 – 2022-08-29 (×4): 50 mg via ORAL
  Filled 2022-08-22 (×4): qty 1

## 2022-08-22 MED ORDER — PROCHLORPERAZINE EDISYLATE 10 MG/2ML IJ SOLN: 5.0000 mg | Freq: Four times a day (QID) | INTRAMUSCULAR | Status: DC | PRN

## 2022-08-22 MED ORDER — FLEET ENEMA 7-19 GM/118ML RE ENEM: 1.0000 | ENEMA | Freq: Once | RECTAL | Status: DC | PRN

## 2022-08-22 MED ORDER — ENOXAPARIN SODIUM 40 MG/0.4ML IJ SOSY: 40.0000 mg | PREFILLED_SYRINGE | INTRAMUSCULAR | Status: DC

## 2022-08-22 MED ORDER — SACUBITRIL-VALSARTAN 49-51 MG PO TABS
1.0000 | ORAL_TABLET | Freq: Two times a day (BID) | ORAL | Status: DC
  Administered 2022-08-23 – 2022-08-26 (×6): 1 via ORAL
  Filled 2022-08-22 (×9): qty 1

## 2022-08-22 MED ORDER — POLYETHYLENE GLYCOL 3350 17 G PO PACK
17.0000 g | PACK | Freq: Every day | ORAL | Status: DC | PRN
  Administered 2022-08-29: 17 g via ORAL
  Filled 2022-08-22: qty 1

## 2022-08-22 MED ORDER — PROCHLORPERAZINE 25 MG RE SUPP: 12.5000 mg | Freq: Four times a day (QID) | RECTAL | Status: DC | PRN

## 2022-08-22 MED ORDER — FUROSEMIDE 40 MG PO TABS
40.0000 mg | ORAL_TABLET | Freq: Every day | ORAL | Status: DC
  Administered 2022-08-23 – 2022-09-10 (×19): 40 mg via ORAL
  Filled 2022-08-22 (×19): qty 1

## 2022-08-22 MED ORDER — OXYCODONE HCL 5 MG PO TABS: 5.0000 mg | ORAL_TABLET | Freq: Four times a day (QID) | ORAL | 0 refills | Status: DC | PRN

## 2022-08-22 MED ORDER — GUAIFENESIN-DM 100-10 MG/5ML PO SYRP: 15.0000 mL | ORAL_SOLUTION | ORAL | 0 refills | Status: DC | PRN

## 2022-08-22 MED ORDER — METOPROLOL TARTRATE 25 MG PO TABS
25.0000 mg | ORAL_TABLET | Freq: Two times a day (BID) | ORAL | Status: DC
  Administered 2022-08-22 – 2022-08-28 (×12): 25 mg via ORAL
  Filled 2022-08-22 (×12): qty 1

## 2022-08-22 MED ORDER — PHENOL 1.4 % MT LIQD: 1.0000 | OROMUCOSAL | Status: DC | PRN

## 2022-08-22 MED ORDER — INSULIN ASPART 100 UNIT/ML IJ SOLN
0.0000 [IU] | Freq: Three times a day (TID) | INTRAMUSCULAR | Status: DC
  Administered 2022-08-22: 2 [IU] via SUBCUTANEOUS
  Administered 2022-08-23: 1 [IU] via SUBCUTANEOUS
  Administered 2022-08-23 – 2022-08-24 (×2): 2 [IU] via SUBCUTANEOUS
  Administered 2022-08-24 – 2022-08-25 (×2): 1 [IU] via SUBCUTANEOUS
  Administered 2022-08-25: 2 [IU] via SUBCUTANEOUS
  Administered 2022-08-25: 1 [IU] via SUBCUTANEOUS
  Administered 2022-08-26 (×2): 3 [IU] via SUBCUTANEOUS
  Administered 2022-08-26: 2 [IU] via SUBCUTANEOUS
  Administered 2022-08-27 – 2022-08-29 (×3): 1 [IU] via SUBCUTANEOUS
  Administered 2022-08-30: 2 [IU] via SUBCUTANEOUS
  Administered 2022-08-31: 3 [IU] via SUBCUTANEOUS
  Administered 2022-08-31 (×2): 2 [IU] via SUBCUTANEOUS
  Administered 2022-09-01: 1 [IU] via SUBCUTANEOUS
  Administered 2022-09-01: 2 [IU] via SUBCUTANEOUS
  Administered 2022-09-02 (×2): 1 [IU] via SUBCUTANEOUS
  Administered 2022-09-03 – 2022-09-04 (×3): 2 [IU] via SUBCUTANEOUS
  Administered 2022-09-04 – 2022-09-05 (×2): 1 [IU] via SUBCUTANEOUS
  Administered 2022-09-06: 2 [IU] via SUBCUTANEOUS
  Administered 2022-09-06: 3 [IU] via SUBCUTANEOUS
  Administered 2022-09-06 – 2022-09-07 (×2): 2 [IU] via SUBCUTANEOUS
  Administered 2022-09-07 (×2): 1 [IU] via SUBCUTANEOUS
  Administered 2022-09-08: 2 [IU] via SUBCUTANEOUS
  Administered 2022-09-08: 1 [IU] via SUBCUTANEOUS
  Administered 2022-09-08: 3 [IU] via SUBCUTANEOUS
  Administered 2022-09-09: 2 [IU] via SUBCUTANEOUS

## 2022-08-22 MED ORDER — FUROSEMIDE 40 MG PO TABS: 40.0000 mg | ORAL_TABLET | Freq: Every day | ORAL | Status: DC

## 2022-08-22 MED ORDER — ALUM & MAG HYDROXIDE-SIMETH 200-200-20 MG/5ML PO SUSP
30.0000 mL | ORAL | Status: DC | PRN
  Administered 2022-08-26: 30 mL via ORAL
  Filled 2022-08-22: qty 30

## 2022-08-22 MED ORDER — JUVEN PO PACK
1.0000 | PACK | Freq: Two times a day (BID) | ORAL | Status: DC
  Administered 2022-08-23 – 2022-09-09 (×13): 1 via ORAL
  Filled 2022-08-22 (×17): qty 1

## 2022-08-22 NOTE — Progress Notes (Signed)
Inpatient Rehabilitation Admission Medication Review by a Pharmacist  A complete drug regimen review was completed for this patient to identify any potential clinically significant medication issues.  High Risk Drug Classes Is patient taking? Indication by Medication  Antipsychotic Yes, as an intravenous medication Prochlorperazine - nausea  Anticoagulant Yes Enoxaparin - VTE prophylaxis  Antibiotic No   Opioid Yes Oxycodone, tramadol - pain  Antiplatelet No   Hypoglycemics/insulin Yes Aspart, glargine - T2DM  Vasoactive Medication Yes Furosemide, metoprolol, Entresto, spironolactone - HFrEF, HTN  Chemotherapy No   Other Yes Tylenol - pain Vitamin C, MVI, zinc - supplement  Trazodone - sleep     Type of Medication Issue Identified Description of Issue Recommendation(s)  Drug Interaction(s) (clinically significant)     Duplicate Therapy     Allergy     No Medication Administration End Date     Incorrect Dose     Additional Drug Therapy Needed     Significant med changes from prior encounter (inform family/care partners about these prior to discharge). PTA medications: lispro  Consider resuming PTA medications during CIR admit or upon discharge as appropriate    Other       Clinically significant medication issues were identified that warrant physician communication and completion of prescribed/recommended actions by midnight of the next day:  No  Name of provider notified for urgent issues identified:   Provider Method of Notification:    Pharmacist comments:   Time spent performing this drug regimen review (minutes):  15   Loura Back, PharmD, BCPS Clinical Pharmacist 08/22/2022 3:49 PM

## 2022-08-22 NOTE — Discharge Summary (Signed)
Physician Discharge Summary  Alan Ginsberg. ZOX:[id number] DOB: May 17, 1975 DOA: 08/13/2022  PCP: Alan Tapia, Alan Tapia  Admit date: 08/13/2022 Discharge date: 08/22/2022  Admitted From: Home Discharge disposition: CIR  Brief narrative: Alan Candee. is a 47 y.o. male with PMH significant for morbid obesity, uncontrolled DM, HTN who has a 3-month history of a left foot ulcer for which she was seeing a podiatrist weekly for debridement at [name], last debridement 06/10/2022. He recently moved to [place] due to a family emergency but he was traveling back-and-forth to see the same podiatrist in [name].  He also had a PICC line and was receiving vancomycin infusions.  PICC line fell out before completion of the antibiotic course for which he did not follow-up.   4/3, presented to ED with complaint of progressive swelling, and clear yellow discharge from the foot.  In the ED, Alan Tapia was afebrile, hemodynamically stable and breathing on room air. MRI left foot showed large plantar foot wound with possible septic arthritis, abscess and worsening osteomyelitis.   EDP discussed with orthopedics 4/5, underwent left BKA by Dr. Lajoyce Corners  Subjective: Alan Tapia was seen and examined this morning.  Sitting up in bed.  Not in distress.  Alan new symptoms.  Getting discharged to CIR today.  Insurance approval received.  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 Lajoyce Corners Initially covered on broad-spectrum IV antibiotics.  Tapia Dr. Audrie Lia note, infection was clear with amputation and hence antibiotics was stopped after 24 hours of surgery.  Alan need of antibiotics at discharge. Currently on pain control with scheduled Tylenol, scheduled oxycodone  Wound VAC in place.  Dr. Lajoyce Corners to see the Alan Tapia at [facility].  New diagnosis of systolic CHF Essential hypertension Alan Tapia had bilateral lower EXTR  lymphedema.  Now with left amputation status continues to have lymphedema on the right. 4/7, echocardiogram obtained which showed a new low EF of 35 to 40% with global hypokinesis. Cardiology consultation obtained. Medicines being adjusted based on hemodynamics and renal function. Currently on Coreg 12.5 mg twice daily, Lasix 40 mg daily, Entresto 49/51 mg twice daily and Aldactone 25 mg daily. Tapia cardiology, repeat echo in 4 to 6 weeks.  Ischemia evaluation may be needed if EF remains down. Net IO Since Admission: 4,060.86 mL [08/22/22 1151] Continue to monitor for daily intake output, weight, blood pressure, BNP, renal function and electrolytes. Recent Labs  Lab 08/16/22 0215 08/17/22 0323 08/19/22 1033 08/19/22 1739 08/20/22 0707 08/21/22 0203 08/22/22 0236  BUN 7 6 <5*  --   --  6 6  CREATININE 1.11 1.14 0.97  --  1.27* 0.99 0.94  K 3.4* 3.6 3.3*  --   --  3.4* 3.9  MG  --   --   --  1.7  --   --   --    Acute on chronic anemia Baseline hemoglobin between 9 and 10.  Postop drop noted. Hemoglobin remained stable between 8 and 9 for the several days. Recent Labs    08/14/22 0500 08/15/22 0309 08/16/22 0215 08/17/22 0323 08/20/22 0707  HGB 8.7* 8.7* 8.1* 8.0* 8.3*  MCV 71.8* 74.6* 74.2* 73.9* 75.5*   Type 2 diabetes mellitus uncontrolled with hyperglycemia A1c 12.3 in January 2024.  Repeat A1c on 4/3 is better at 10.3. Diabetes care coordinator consulted. He was started on basal bolus regimen of insulin.  Dose adjusted based on blood sugar trend. Discharge on  Semglee 15 units daily and sliding scale insulin with Accu-Cheks.  Recent Labs  Lab 08/21/22 0825 08/21/22 1120 08/21/22 1619 08/21/22 2004 08/22/22 0715  GLUCAP 110* 168* 167* 209* 141*   Morbid Obesity  Body mass index is 45.52 kg/m. Alan Tapia has been advised to make an attempt to improve diet and exercise patterns to aid in weight loss.  Mobility: PT eval obtained.  CIR recommended.  Goals of care    Code Status: Full Code   Wounds:  - Incision (Closed) 08/15/22 Leg Left (Active)  Date First Assessed/Time First Assessed: 08/15/22 1321   Location: Leg  Location Orientation: Left    Assessments 08/15/2022  2:08 PM 08/22/2022  8:29 AM  Dressing Type Negative pressure wound therapy;Other (Comment) Negative pressure wound therapy  Dressing Clean, Dry, Intact Clean, Dry, Intact  Site / Wound Assessment Dressing in place / Unable to assess Dressing in place / Unable to assess  Drainage Amount None None     Alan associated orders.     Negative Pressure Wound Therapy Pretibial Left (Active)  Placement Date/Time: 08/15/22 1344   Wound Type: Incision (Closed wound)  Location: Pretibial  Location Orientation: Left    Assessments 08/15/2022  2:08 PM 08/22/2022  8:29 AM  Last dressing change 08/15/22 --  Site / Wound Assessment Dressing in place / Unable to assess Dressing in place / Unable to assess  Cycle Continuous --  Target Pressure (mmHg) 125 125  Machine plugged into wall outlet (NOT bed outlet) -- Yes  Dressing Status Intact Intact     Alan associated orders.    Discharge Exam:   Vitals:   08/21/22 1511 08/21/22 2001 08/22/22 0438 08/22/22 0718  BP: (!) 147/82 (!) 147/84 (!) 179/96 (!) 165/83  Pulse: 81 81 83 81  Resp: 18 19 18    Temp: 98.2 F (36.8 C) 98.3 F (36.8 C) 98 F (36.7 C) 98 F (36.7 C)  TempSrc: Oral Oral  Oral  SpO2: 100% 100% 100% 100%  Weight:      Height:        Body mass index is 45.52 kg/m.   General exam: Pleasant, middle-aged African-American male. Skin: Alan rashes, lesions or ulcers. HEENT: Atraumatic, normocephalic, Alan obvious bleeding Lungs: Clear to auscultation bilaterally CVS: Regular rate and rhythm, Alan murmur GI/Abd soft, nontender, nondistended, bowel sound present. CNS: Alert, awake, oriented x 3 Psychiatry: Mood appropriate Extremities: Gradually improving right lower extremity edema.  Left lower extremity BKA status.  On boot and wound  VAC  Follow ups:    Follow-up Information     [facility] Follow up on 09/03/2022.   Why: post hospital follow up and to establish primary care scheduled for 09/03/2022 at 2:30 pm with Bertram Denver NP Contact information: [address] [place] [address] [phone]        Nadara Mustard, MD Follow up in 1 week(s).   Specialty: Orthopedic Surgery Contact information: [address] [name] [address] [phone]         [facility] Follow up on 09/15/2022.   Specialty: Cardiology Why: at 9 AM  please call before going to ask about parking Contact information: [address] [id number] mc [place] [address] [phone]                Discharge Instructions:   Discharge Instructions     Call MD for:  difficulty breathing, headache or visual  disturbances   Complete by: As directed    Call MD for:  extreme fatigue   Complete by: As directed    Call MD for:  hives   Complete by: As directed    Call MD for:  persistant dizziness or light-headedness   Complete by: As directed    Call MD for:  persistant nausea and vomiting   Complete by: As directed    Call MD for:  severe uncontrolled pain   Complete by: As directed    Call MD for:  temperature >100.4   Complete by: As directed    Diet - low sodium heart healthy   Complete by: As directed    Diet Carb Modified   Complete by: As directed    Discharge instructions   Complete by: As directed    Discharge instructions for diabetes mellitus: Check blood sugar 3 times a day and bedtime at home. If blood sugar running above 200 or less than 70 please call your MD to adjust insulin. If you notice signs and symptoms of hypoglycemia (low blood sugar) like jitteriness, confusion, thirst, tremor and sweating, please check blood sugar, drink sugary drink/biscuits/sweets to increase  sugar level and call MD or return to ER.    Discharge instructions for CHF Check weight daily -preferably same time every day. Restrict fluid intake to 1200 ml daily Restrict salt intake to less than 2 g daily. Call MD if you have one of the following symptoms 1) 3 pound weight gain in 24 hours or 5 pounds in 1 week  2) swelling in the hands, feet or stomach  3) progressive shortness of breath 4) if you have to sleep on extra pillows at night in order to breathe     General discharge instructions: Follow with Primary MD Alan Tapia, Alan Tapia in 7 days  Please request your PCP  to go over your hospital tests, procedures, radiology results at the follow up. Please get your medicines reviewed and adjusted.  Your PCP may decide to repeat certain labs or tests as needed. Do not drive, operate heavy machinery, perform activities at heights, swimming or participation in water activities or provide baby sitting services if your were admitted for syncope or siezures until you have seen by Primary MD or a Neurologist and advised to do so again. [place] Controlled Substance Reporting System database was reviewed. Do not drive, operate heavy machinery, perform activities at heights, swim, participate in water activities or provide baby-sitting services while on medications for pain, sleep and mood until your outpatient physician has reevaluated you and advised to do so again.  You are strongly recommended to comply with the dose, frequency and duration of prescribed medications. Activity: As tolerated with Full fall precautions use walker/cane & assistance as needed Avoid using any recreational substances like cigarette, tobacco, alcohol, or non-prescribed drug. If you experience worsening of your admission symptoms, develop shortness of breath, life threatening emergency, suicidal or homicidal thoughts you must seek medical attention immediately by calling 911 or calling your MD immediately  if  symptoms less severe. You must read complete instructions/literature along with all the possible adverse reactions/side effects for all the medicines you take and that have been prescribed to you. Take any new medicine only after you have completely understood and accepted all the possible adverse reactions/side effects.  Wear Seat belts while driving. You were cared for by a hospitalist during your hospital stay. If you have any questions about your discharge medications or the  care you received while you were in the hospital after you are discharged, you can call the unit and ask to speak with the hospitalist or the covering physician. Once you are discharged, your primary care physician will handle any further medical issues. Please note that Alan REFILLS for any discharge medications will be authorized once you are discharged, as it is imperative that you return to your primary care physician (or establish a relationship with a primary care physician if you do not have one).   Discharge wound care:   Complete by: As directed    Increase activity slowly   Complete by: As directed    Negative Pressure Wound Therapy - Incisional   Complete by: As directed        Discharge Medications:   Allergies as of 08/22/2022       Reactions   Fish Allergy Anaphylaxis, Hives, Other (See Comments)   Benadryl [diphenhydramine] Swelling   Pt reports cannot take pill form of benadryl   Vancomycin    Redman's-type rash on 08/13/22        Medication List     STOP taking these medications    ibuprofen 200 MG tablet Commonly known as: ADVIL   insulin glargine 100 UNIT/ML injection Commonly known as: LANTUS   insulin lispro 100 UNIT/ML KwikPen Commonly known as: HUMALOG   oxycodone 5 MG capsule Commonly known as: OXY-IR Replaced by: oxyCODONE 5 MG immediate release tablet   vancomycin  IVPB       TAKE these medications    acetaminophen 500 MG tablet Commonly known as: TYLENOL Take 2  tablets (1,000 mg total) by mouth 3 (three) times daily.   bisacodyl 5 MG EC tablet Commonly known as: DULCOLAX Take 1 tablet (5 mg total) by mouth daily as needed for moderate constipation.   carvedilol 12.5 MG tablet Commonly known as: COREG Take 1 tablet (12.5 mg total) by mouth 2 (two) times daily with a meal.   furosemide 40 MG tablet Commonly known as: LASIX Take 1 tablet (40 mg total) by mouth daily. Start taking on: August 23, 2022   guaiFENesin-dextromethorphan 100-10 MG/5ML syrup Commonly known as: ROBITUSSIN DM Take 15 mLs by mouth every 4 (four) hours as needed for cough.   insulin aspart 100 UNIT/ML injection Commonly known as: novoLOG Inject 0-5 Units into the skin at bedtime.   insulin aspart 100 UNIT/ML injection Commonly known as: novoLOG Inject 0-9 Units into the skin 3 (three) times daily with meals.   insulin glargine-yfgn 100 UNIT/ML injection Commonly known as: SEMGLEE Inject 0.15 mLs (15 Units total) into the skin daily. Start taking on: August 23, 2022   multivitamin with minerals Tabs tablet Take 1 tablet by mouth daily. Start taking on: August 23, 2022   oxyCODONE 5 MG immediate release tablet Commonly known as: Oxy IR/ROXICODONE Take 1 tablet (5 mg total) by mouth every 6 (six) hours as needed for moderate pain or severe pain. Replaces: oxycodone 5 MG capsule   polyethylene glycol 17 g packet Commonly known as: MIRALAX / GLYCOLAX Take 17 g by mouth daily as needed for mild constipation.   sacubitril-valsartan 49-51 MG Commonly known as: ENTRESTO Take 1 tablet by mouth 2 (two) times daily.   spironolactone 25 MG tablet Commonly known as: ALDACTONE Take 1 tablet (25 mg total) by mouth daily. Start taking on: August 23, 2022               Discharge Care Instructions  (From admission, onward)  Start     Ordered   08/22/22 0000  Discharge wound care:        08/22/22 1151             The results of significant  diagnostics from this hospitalization (including imaging, microbiology, ancillary and laboratory) are listed below for reference.    Procedures and Diagnostic Studies:   MRI Left foot without contrast  Result Date: 08/13/2022 CLINICAL DATA:  Soft tissue infection suspected, foot, xray done EXAM: MRI OF THE LEFT FOOT WITHOUT CONTRAST TECHNIQUE: Multiplanar, multisequence MR imaging of the left foot was performed. Alan intravenous contrast was administered. 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 There is prominent adjacent heterotopic ossification and callus formation, demonstrating increased edema signal and some areas of low T1 signal. Alan significant marrow abnormality involving the second or third rays. There is marrow edema throughout the fourth toe phalanges and fourth metatarsal. There is confluent low T1 marrow signal in the periarticular fourth toe proximal phalanx and metatarsal head at the MTP joint with and MTP joint effusion. There is marrow edema and confluent low T1 signal throughout the fifth toe proximal phalanx and fifth metatarsal. There is extensive marrow edema within the plantar aspect of the cuboid with preserved T1 marrow signal. Very mild periarticular middle and lateral cuneiforms marrow edema at the TMT joints, with preserved T1 marrow signal, likely reactive. Ligaments Intact Lisfranc ligament. Muscles and Tendons Denervation changes of the foot musculature. Soft tissues Extensive skin thickening and soft tissue swelling of the foot. There is a large lateral plantar wound with notable tract extending to the base of the fifth metatarsal. There is underlying confluent fluid and soft tissue gas related to the ulcer/wound measuring up to 1.0 cm short axis and extends to the surface of the base of the fifth metatarsal. IMPRESSION: Large plantar foot wound with lateral  midfoot tract extending to the base of the fifth metatarsal. Underlying confluent fluid in the lateral midfoot measuring up to 1.0 cm short axis which could reflect a fluid collection/abscess or coalescent edema, correlate with drainage. Osteomyelitis of the fifth toe proximal phalanx and of the entire fifth metatarsal. Septic arthritis and periarticular osteomyelitis of the fourth toe MTP joint. Marrow edema involvement includes all of the fourth toe phalanges and the entirety of the fourth metatarsal. 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 Marrow edema within the plantar aspect of the cuboid which could reflect reactive marrow change or early osteomyelitis. Extensive soft tissue swelling of the foot. Electronically Signed   By: Caprice Renshaw M.D.   On: 08/13/2022 14:56   DG Foot Complete Left  Result Date: 08/13/2022 CLINICAL DATA:  Foot wound. Osteomyelitis. None cellulitis and osteomyelitis. Nonhealing ulcer on bottom of left foot for several months. Alan Tapia was being treated in [name] with IV antibiotics through a PICC line, and had discussion about proceeding to wound VAC. Alan Tapia reports he had to move to [place] suddenly and has not had any IV antibiotics for 4 weeks. EXAM: LEFT FOOT - COMPLETE 3+ VIEW COMPARISON:  Left foot radiographs 06/06/2022 FINDINGS: There is high-grade soft tissue swelling. Postsurgical changes are again seen of amputation of the first digit to the mid metatarsal shaft. Unchanged chronic bone formation abutting the distal lateral aspect of the first metatarsal. There is  a new soft tissue ulcer lateral to the proximal 50% of the fifth metatarsal shaft. Note is made that subcutaneous air was previously seen lateral to the distal fifth metatarsal, there were findings concerning for  osteomyelitis on both sides of the fifth metatarsophalangeal joint. This distal fifth metatarsal and proximal aspect of the proximal phalanx cortical erosion has progressed. There is diffuse periosteal reaction along the fifth metatarsal shaft. There is also new mild erosion at the lateral base of the fifth metatarsal. There is new mild-to-moderate erosion at the distal fourth metatarsal and proximal lateral aspect of the fourth proximal phalanx. Moderate to large plantar and posterior calcaneal heel spurs. 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 IMPRESSION: 1. High-grade soft tissue swelling. 2. New soft tissue ulcer lateral to the proximal 50% of the fifth metatarsal shaft. 3. Progression of osteomyelitis of the fifth digit distal aspect of the metatarsal and proximal aspect of the proximal phalanx. New erosion at the lateral base of the fifth metatarsal. 4. New mild-to-moderate erosion at the distal fourth metatarsal and proximal lateral aspect of the fourth proximal phalanx. 5. These findings are highly suspicious for progressive osteomyelitis. Electronically Signed   By: Neita Garnet M.D.   On: 08/13/2022 13:27     Labs:   Basic Metabolic Panel: Recent Labs  Lab 08/16/22 0215 08/17/22 0323 08/19/22 1033 08/19/22 1739 08/20/22 0707 08/21/22 0203 08/22/22 0236  NA 132* 136 136  --   --  135 137  K 3.4* 3.6 3.3*  --   --  3.4* 3.9  CL 101 103 106  --   --  102 106  CO2 23 25 25   --   --  26 24  GLUCOSE 247* 142* 171*  --   --  129* 174*  BUN 7 6 <5*  --   --  6 6  CREATININE 1.11 1.14 0.97  --  1.27* 0.99 0.94  CALCIUM 7.4* 7.6* 7.5*  --   --  7.7* 7.8*  MG  --   --   --  1.7  --   --   --    GFR Estimated Creatinine Clearance: 164.8 mL/min (by C-G formula based on SCr of 0.94 mg/dL). Liver Function Tests: Alan results for input(s): "AST", "ALT", "ALKPHOS", "BILITOT", "PROT", "ALBUMIN" in the last 168 hours. Alan results for input(s): "LIPASE", "AMYLASE" in the last 168  hours. Alan results for input(s): "AMMONIA" in the last 168 hours. Coagulation profile Alan results for input(s): "INR", "PROTIME" in the last 168 hours.  CBC: Recent Labs  Lab 08/16/22 0215 08/17/22 0323 08/20/22 0707  WBC 8.6 8.1 6.6  NEUTROABS 6.6 5.7  --   HGB 8.1* 8.0* 8.3*  HCT 26.4* 25.8* 27.5*  MCV 74.2* 73.9* 75.5*  PLT 367 397 423*   Cardiac Enzymes: Alan results for input(s): "CKTOTAL", "CKMB", "CKMBINDEX", "TROPONINI" in the last 168 hours. BNP: Invalid input(s): "POCBNP" CBG: Recent Labs  Lab 08/21/22 0825 08/21/22 1120 08/21/22 1619 08/21/22 2004 08/22/22 0715  GLUCAP 110* 168* 167* 209* 141*   D-Dimer Alan results for input(s): "DDIMER" in the last 72 hours. Hgb A1c Alan results for input(s): "HGBA1C" in the last 72 hours. Lipid Profile Alan results for input(s): "CHOL", "HDL", "LDLCALC", "TRIG", "CHOLHDL", "LDLDIRECT" in the last 72 hours. Thyroid function studies Alan results for input(s): "TSH", "T4TOTAL", "T3FREE", "THYROIDAB" in the last 72 hours.  Invalid input(s): "FREET3" Anemia work up Alan results for input(s): "VITAMINB12", "FOLATE", "FERRITIN", "TIBC", "IRON", "RETICCTPCT" in the last 72  hours. Microbiology Recent Results (from the past 240 hour(s))  Blood culture (routine x 2)     Status: None   Collection Time: 08/13/22  1:28 PM   Specimen: BLOOD LEFT ARM  Result Value Ref Range Status   Specimen Description   Final    BLOOD LEFT ARM Performed at [facility], [address], [place], [name] [address]    Special Requests   Final    BOTTLES DRAWN AEROBIC AND ANAEROBIC Blood Culture results may not be optimal due to an inadequate volume of blood received in culture bottles Performed at [facility], [address] [place], [name] [address]    Culture   Final    Alan GROWTH 5 DAYS Performed at [facility], [address] [place], [name] [address]    Report Status 08/18/2022 FINAL  Final  Blood  culture (routine x 2)     Status: None   Collection Time: 08/13/22  7:40 PM   Specimen: BLOOD  Result Value Ref Range Status   Specimen Description BLOOD SITE NOT SPECIFIED  Final   Special Requests   Final    BOTTLES DRAWN AEROBIC AND ANAEROBIC Blood Culture results may not be optimal due to an inadequate volume of blood received in culture bottles   Culture   Final    Alan GROWTH 5 DAYS Performed at [facility], [address] [place], [name] [address]    Report Status 08/18/2022 FINAL  Final  Surgical pcr screen     Status: None   Collection Time: 08/14/22 12:20 PM   Specimen: Nasal Mucosa; Nasal Swab  Result Value Ref Range Status   MRSA, PCR NEGATIVE NEGATIVE Final   Staphylococcus aureus NEGATIVE NEGATIVE Final    Comment: (NOTE) The Xpert SA Assay (FDA approved for NASAL specimens in patients 6 years of age and older), is one component of a comprehensive surveillance program. It is not intended to diagnose infection nor to guide or monitor treatment. Performed at [facility], [address] [place], [name] [address]     Time coordinating discharge: 45 minutes  Signed: Maximillion Gill  [facility] 08/22/2022, 11:51 AM

## 2022-08-22 NOTE — Progress Notes (Signed)
Physical Therapy Treatment Patient Details Name: Alan Tapia. MRN: [id number] DOB: 16-Mar-1976 Today's Date: 08/22/2022   History of Present Illness Pt is a 47 y.o. male admitted 4/3 with 6 mo history of L foot ulcer; Presenting with swelling and progressive yellow discharge from the foot. 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 PMH significant for DMII, HTN.    PT Comments    Pt was received sitting EOB with OT present and agreeable to session focused on sit to stand transfers and anterior weight shift. Pt initially used stedy for increased support for 4 squat and 2 stand trials requiring up to mod A +2 for balance due to posterior lean. Pt transitioned to RW support with improved power up and balance in standing, continuing to require initial mod A +2 for balance, but progressing to min guard with prolonged standing. Able to lower the bed slightly with pt reaching and maintaining upright stance for ~30 seconds each trial. Pt with good effort and motivation throughout. Pt continues to benefit from PT services to progress toward functional mobility goals.     Recommendations for follow up therapy are one component of a multi-disciplinary discharge planning process, led by the attending physician.  Recommendations may be updated based on patient status, additional functional criteria and insurance authorization.  Follow Up Recommendations  Can patient physically be transported by private vehicle: No    Assistance Recommended at Discharge Frequent or constant Supervision/Assistance  Patient can return home with the following Two people to help with walking and/or transfers;Two people to help with bathing/dressing/bathroom;Assistance with cooking/housework;Assist for transportation;Help with stairs or ramp for entrance   Equipment Recommendations  None recommended by PT    Recommendations for Other Services       Precautions / Restrictions Precautions Precautions: Fall;Knee Precaution Booklet Issued: Yes  (comment) Precaution Comments: Reviewed prec Required Braces or Orthoses: Other Brace Other Brace: LLE brace Restrictions Weight Bearing Restrictions: Yes LLE Weight Bearing: Non weight bearing     Mobility  Bed Mobility Overal bed mobility: Needs Assistance Bed Mobility: Sit to Supine       Sit to supine: Supervision   General bed mobility comments: Pt able to manage BLE elevation to EOB and reposition in bed with supervision.    Transfers Overall transfer level: Needs assistance Equipment used: Rolling walker (2 wheels), Ambulation equipment used Transfers: Sit to/from Stand Sit to Stand: Min assist, +2 physical assistance, +2 safety/equipment, From elevated surface, Mod assist           General transfer comment: Heavy focus on STS transfers this session. Initially performing with stedy to optimize anterior weight shift. Performing 4 mini squats with bringing bottom off of bed with stedy. Then transitioning to full stand 2x with stedy with min A for boost and mod A for stedy with +2 A at all times for safety. Pt with good safety awareness and tolerance of standing, so transitioned to RW and pt continues to require min A +2 for rise and mod A +2 to stedy for 3 additional attempts    Ambulation/Gait             Pre-gait activities: Pivoting R foot laterally (heel-toe) in standing with RW support        Balance Overall balance assessment: Needs assistance Sitting-balance support: No upper extremity supported, Feet supported Sitting balance-Leahy Scale: Good Sitting balance - Comments: sitting EOB   Standing balance support: Bilateral upper extremity supported, Reliant on assistive device for balance Standing balance-Leahy Scale: Poor Standing balance  comment: standing up to 30 seconds with min guard A with BUE supported on RW. Pt attempting to pivot on RLE with RW support and able to pivot ~2 in.                            Cognition  Arousal/Alertness: Awake/alert Behavior During Therapy: WFL for tasks assessed/performed Overall Cognitive Status: Within Functional Limits for tasks assessed                                          Exercises      General Comments General comments (skin integrity, edema, etc.): LLE limb guard donned throughout session      Pertinent Vitals/Pain Pain Assessment Pain Assessment: 0-10 Pain Score: 6  Pain Location: reslidual limb with mobility Pain Descriptors / Indicators: Aching, Throbbing Pain Intervention(s): Limited activity within patient's tolerance, Monitored during session, Repositioned, Premedicated before session     PT Goals (current goals can now be found in the care plan section) Acute Rehab PT Goals Patient Stated Goal: get well, go to rehab. PT Goal Formulation: With patient/family Time For Goal Achievement: 08/30/22 Potential to Achieve Goals: Good Progress towards PT goals: Progressing toward goals    Frequency    Min 4X/week      PT Plan Current plan remains appropriate    Co-evaluation PT/OT/SLP Co-Evaluation/Treatment: Yes Reason for Co-Treatment: For patient/therapist safety;To address functional/ADL transfers PT goals addressed during session: Mobility/safety with mobility;Balance;Proper use of DME OT goals addressed during session: Strengthening/ROM;ADL's and self-care;Proper use of Adaptive equipment and DME      AM-PAC PT "6 Clicks" Mobility   Outcome Measure  Help needed turning from your back to your side while in a flat bed without using bedrails?: A Little Help needed moving from lying on your back to sitting on the side of a flat bed without using bedrails?: A Little Help needed moving to and from a bed to a chair (including a wheelchair)?: A Little Help needed standing up from a chair using your arms (e.g., wheelchair or bedside chair)?: Total Help needed to walk in hospital room?: Total Help needed climbing 3-5  steps with a railing? : Total 6 Click Score: 12    End of Session Equipment Utilized During Treatment: Gait belt Activity Tolerance: Patient tolerated treatment well Patient left: with call bell/phone within reach;with bed alarm set;in bed;with family/visitor present Nurse Communication: Mobility status PT Visit Diagnosis: Unsteadiness on feet (R26.81);Muscle weakness (generalized) (M62.81);Difficulty in walking, not elsewhere classified (R26.2);Other abnormalities of gait and mobility (R26.89);Pain     Time: 1610-9604 PT Time Calculation (min) (ACUTE ONLY): 37 min  Charges:  $Therapeutic Activity: 23-37 mins                     Johny Shock, PTA Acute Rehabilitation Services Secure Chat Preferred  [phone]    Johny Shock 08/22/2022, 10:42 AM

## 2022-08-22 NOTE — Progress Notes (Signed)
Report given to [facility] at . All questions and concerns were fully answered.

## 2022-08-22 NOTE — H&P (Signed)
Physical Medicine and Rehabilitation Admission H&P    Chief Complaint  Patient presents with   Functional deficits due to L-BKA    HPI:  Alan Tapia is a 47 year old male with history of HTN, T2DM, morbid obesity- BMI, left foot wound X 2 years w/p ray amp and last debrided earlier this year with recommendations of IV Vanc/Zosyn but PICC fell out and he had to come to [place] due to family emergency and had missed antibiotics X 4 weeks.  He was admitted on 08/13/22 with increase swelling, increase in drainage and MRI of foot done showing large plantar wound with fluid collection/abscess or coalescent edema, osteomyelitis of 5th proximal phalanx and entire 5 MT, septic arthritis v/s osteo 4th MTP joint and question of osteo of cuboid and at post amp site.  He was started on IV antibiotics for acute on chronic osteomyelitis and underwent L-BKA on 08/15/22 by Dr. Lajoyce Corners.   2D echo done due to chronic BLE lymphedema as well as reports of SOB and intermittent CP.  This revealed EF 35-40% with global hypokinesis with mild dilation of LV/LA and dilated inferior vena cava.he was started on GDMT due to suspicion of CHF and Dr. Shari Prows was consulted for input. She recommended multiple medication changes and recommended not adding SGLT2i until DM better controlled. He is to follow up in office for repeat echo in 4-6 weeks and if EF remains depressed; will need ischemia work up.  RD and HF pharmacy have been following for education/input.  Meal coverage added for better BS control per diabetes coordinator's input. Patient was independent PTA. PT/OT has been working with patient who continues to require mod A+2 to stand for 30 seconds, has LOB posteriorly and attempting pivot transfers. CIR recommended due to functional decline.     Review of Systems  Constitutional:  Positive for malaise/fatigue. Negative for chills.  HENT:  Negative for hearing loss.   Eyes:  Negative for blurred vision.  Respiratory:   Negative for cough and shortness of breath.   Cardiovascular:  Positive for leg swelling. Negative for chest pain.  Gastrointestinal:  Positive for abdominal pain, diarrhea and nausea.  Genitourinary:  Positive for frequency.  Musculoskeletal:  Positive for joint pain and myalgias.  Skin:  Negative for rash.  Neurological:  Positive for dizziness.  Psychiatric/Behavioral:  Negative for suicidal ideas.      Past Medical History:  Diagnosis Date   Hypertension    Uncontrolled type 2 diabetes mellitus with hyperglycemia, with long-term current use of insulin 06/06/2022    Past Surgical History:  Procedure Laterality Date   AMPUTATION Left 08/15/2022   Procedure: LEFT BELOW KNEE AMPUTATION;  Surgeon: Nadara Mustard, MD;  Location: [facility] OR;  Service: Orthopedics;  Laterality: Left;    Family History  Problem Relation Age of Onset   Arthritis Mother    Allergies Father    Hypertension Father    Diabetes Father     Social History:  reports that he has never smoked. He has never used smokeless tobacco. He reports that he does not drink alcohol and does not use drugs.   Allergies  Allergen Reactions   Fish Allergy Anaphylaxis, Hives and Other (See Comments)   Benadryl [Diphenhydramine] Swelling    Pt reports cannot take pill form of benadryl   Vancomycin     Redman's-type rash on 08/13/22    Medications Prior to Admission  Medication Sig Dispense Refill   ibuprofen (ADVIL,MOTRIN) 200 MG tablet Take  800 mg by mouth daily.     insulin glargine (LANTUS) 100 UNIT/ML injection Inject 3 Units into the skin daily. Injecting 3 units once daily     insulin lispro (HUMALOG) 100 UNIT/ML KwikPen Inject 6 Units into the skin 3 (three) times daily.     vancomycin IVPB Inject 1,000 mg into the vein.     oxycodone (OXY-IR) 5 MG capsule Take 5 mg by mouth every 4 (four) hours as needed for pain. Q4-6 hours (Patient not taking: Reported on 08/15/2022)        Home: Home Living Family/patient  expects to be discharged to:: Private residence Living Arrangements: Spouse/significant other, Children (three children 1-16 y.o) Available Help at Discharge: Family, Available 24 hours/day Type of Home: Other(Comment) (townhouse) Home Access: Stairs to enter [employer] of Steps: 2 Entrance Stairs-Rails: Right, Left Home Layout: Two level, 1/2 bath on main level Alternate Level Stairs-Number of Steps: 13 Alternate Level Stairs-Rails: Left Bathroom Shower/Tub: Engineer, manufacturing systems: Standard Bathroom Accessibility: Yes Home Equipment: Cane - quad  Lives With: Significant other   Functional History: Prior Function Prior Level of Function : Needs assist Mobility Comments: Using quad cane for ambulation, no falls. ADLs Comments: Needed help from wife extensively, assisted with dressing, wound dressing, manages finances. He was able to drive. Previously taught middle school special needs  Functional Status:  Mobility: Bed Mobility Overal bed mobility: Needs Assistance Bed Mobility: Sit to Supine Supine to sit: Supervision Sit to supine: Supervision General bed mobility comments: Pt able to manage BLE elevation to EOB and reposition in bed with supervision. Transfers Overall transfer level: Needs assistance Equipment used: Rolling walker (2 wheels), Ambulation equipment used Transfers: Sit to/from Stand Sit to Stand: Min assist, +2 physical assistance, +2 safety/equipment, From elevated surface, Mod assist Bed to/from chair/wheelchair/BSC transfer type:: Squat pivot Squat pivot transfers: Min assist  Lateral/Scoot Transfers: Supervision General transfer comment: Heavy focus on STS transfers this session. Initially performing with stedy to optimize anterior weight shift. Performing 4 mini squats with bringing bottom off of bed with stedy. Then transitioning to full stand 2x with stedy with min A for boost and mod A for stedy with +2 A at all times for safety. Pt  with good safety awareness and tolerance of standing, so transitioned to RW and pt continues to require min A +2 for rise and mod A +2 to stedy for 3 additional attempts Ambulation/Gait Pre-gait activities: Pivoting R foot laterally (heel-toe) in standing with RW support    ADL: ADL Overall ADL's : Needs assistance/impaired Eating/Feeding: Modified independent, Sitting Grooming: Set up, Sitting Upper Body Bathing: Set up, Sitting Lower Body Bathing: Maximal assistance, Sitting/lateral leans Upper Body Dressing : Set up, Sitting Upper Body Dressing Details (indicate cue type and reason): new gown Lower Body Dressing: Supervision/safety, Sitting/lateral leans Lower Body Dressing Details (indicate cue type and reason): Reviewing techniques for LB ADL with pt. PT with greater comfort performing ADL pivoting to get RLE into bed with him rather than crossing R over L Toilet Transfer: Rolling walker (2 wheels), Moderate assistance, +2 for physical assistance, +2 for safety/equipment (stedy and RW attempts at STS see transfer comments) Toilet Transfer Details (indicate cue type and reason): STS Functional mobility during ADLs: Moderate assistance, Minimal assistance, +2 for physical assistance, +2 for safety/equipment, Rolling walker (2 wheels) (stedy) General ADL Comments: Limited by balance, pain, decr strength  Cognition: Cognition Overall Cognitive Status: Within Functional Limits for tasks assessed Orientation Level: Oriented X4 Cognition Arousal/Alertness: Awake/alert Behavior  During Therapy: WFL for tasks assessed/performed Overall Cognitive Status: Within Functional Limits for tasks assessed General Comments: Very motivated to participate in therapy. Pt believing that he should be doing more and benefits from education regarding expected progression   Blood pressure (!) 171/97, pulse 87, temperature 97.7 F (36.5 C), temperature source Oral, resp. rate 19, height  (1.93 m),  weight (!) 169.6 kg, SpO2 100 %. Physical Exam Constitutional:      General: He is not in acute distress.    Appearance: He is obese.  HENT:     Head: Normocephalic.     Nose: Nose normal.     Mouth/Throat:     Mouth: Mucous membranes are moist.  Eyes:     Extraocular Movements: Extraocular movements intact.     Conjunctiva/sclera: Conjunctivae normal.     Pupils: Pupils are equal, round, and reactive to light.  Cardiovascular:     Rate and Rhythm: Normal rate and regular rhythm.     Heart sounds: No murmur heard. Pulmonary:     Effort: Pulmonary effort is normal. No respiratory distress.     Breath sounds: No wheezing.  Abdominal:     General: Bowel sounds are normal. There is no distension.     Palpations: Abdomen is soft.  Musculoskeletal:     Cervical back: Normal range of motion.     Comments: Chronic lymphedema RLE. LLE in Vac/dressing with shrinker over top  Skin:    Comments: Chronic changes distal RLE d/t lymphedema. No sign of breakdown. Left BKA with vac in place, small amount serosanguinous blood in cannister.   Neurological:     Comments: Alert and oriented x 3. Normal insight and awareness. Intact Memory. Normal language and speech. Cranial nerve exam unremarkable. MMT: UE 5/5 prox to distal. RLE 4/5 prox to distal. LLE: 2/5 HF. Sensory exam appears grossly normal for light touch and pain in all 4 limbs. No limb ataxia or cerebellar signs. No abnormal tone appreciated.  Marland Kitchen    Psychiatric:        Mood and Affect: Mood normal.        Behavior: Behavior normal.     Results for orders placed or performed during the hospital encounter of 08/13/22 (from the past 48 hour(s))  Glucose, capillary     Status: Abnormal   Collection Time: 08/20/22  4:37 PM  Result Value Ref Range   Glucose-Capillary 134 (H) 70 - 99 mg/dL    Comment: Glucose reference range applies only to samples taken after fasting for at least 8 hours.  Glucose, capillary     Status: Abnormal    Collection Time: 08/20/22  9:22 PM  Result Value Ref Range   Glucose-Capillary 160 (H) 70 - 99 mg/dL    Comment: Glucose reference range applies only to samples taken after fasting for at least 8 hours.  Basic metabolic panel     Status: Abnormal   Collection Time: 08/21/22  2:03 AM  Result Value Ref Range   Sodium 135 135 - 145 mmol/L   Potassium 3.4 (L) 3.5 - 5.1 mmol/L   Chloride 102 98 - 111 mmol/L   CO2 26 22 - 32 mmol/L   Glucose, Bld 129 (H) 70 - 99 mg/dL    Comment: Glucose reference range applies only to samples taken after fasting for at least 8 hours.   BUN 6 6 - 20 mg/dL   Creatinine, Ser 1.32 0.61 - 1.24 mg/dL   Calcium 7.7 (L) 8.9 - 10.3 mg/dL  GFR, Estimated >60 >60 mL/min    Comment: (NOTE) Calculated using the CKD-EPI Creatinine Equation (2021)    Anion gap 7 5 - 15    Comment: Performed at [facility], [address] [place], [name] [address]  Glucose, capillary     Status: Abnormal   Collection Time: 08/21/22  8:25 AM  Result Value Ref Range   Glucose-Capillary 110 (H) 70 - 99 mg/dL    Comment: Glucose reference range applies only to samples taken after fasting for at least 8 hours.  Glucose, capillary     Status: Abnormal   Collection Time: 08/21/22 11:20 AM  Result Value Ref Range   Glucose-Capillary 168 (H) 70 - 99 mg/dL    Comment: Glucose reference range applies only to samples taken after fasting for at least 8 hours.  Glucose, capillary     Status: Abnormal   Collection Time: 08/21/22  4:19 PM  Result Value Ref Range   Glucose-Capillary 167 (H) 70 - 99 mg/dL    Comment: Glucose reference range applies only to samples taken after fasting for at least 8 hours.  Glucose, capillary     Status: Abnormal   Collection Time: 08/21/22  8:04 PM  Result Value Ref Range   Glucose-Capillary 209 (H) 70 - 99 mg/dL    Comment: Glucose reference range applies only to samples taken after fasting for at least 8 hours.  Basic metabolic panel     Status:  Abnormal   Collection Time: 08/22/22  2:36 AM  Result Value Ref Range   Sodium 137 135 - 145 mmol/L   Potassium 3.9 3.5 - 5.1 mmol/L   Chloride 106 98 - 111 mmol/L   CO2 24 22 - 32 mmol/L   Glucose, Bld 174 (H) 70 - 99 mg/dL    Comment: Glucose reference range applies only to samples taken after fasting for at least 8 hours.   BUN 6 6 - 20 mg/dL   Creatinine, Ser 2.83 0.61 - 1.24 mg/dL   Calcium 7.8 (L) 8.9 - 10.3 mg/dL   GFR, Estimated >15 >17 mL/min    Comment: (NOTE) Calculated using the CKD-EPI Creatinine Equation (2021)    Anion gap 7 5 - 15    Comment: Performed at [facility], [address] [place], [name] [address]  Glucose, capillary     Status: Abnormal   Collection Time: 08/22/22  7:15 AM  Result Value Ref Range   Glucose-Capillary 141 (H) 70 - 99 mg/dL    Comment: Glucose reference range applies only to samples taken after fasting for at least 8 hours.  Glucose, capillary     Status: Abnormal   Collection Time: 08/22/22 11:47 AM  Result Value Ref Range   Glucose-Capillary 144 (H) 70 - 99 mg/dL    Comment: Glucose reference range applies only to samples taken after fasting for at least 8 hours.   No results found.    Blood pressure (!) 171/97, pulse 87, temperature 97.7 F (36.5 C), temperature source Oral, resp. rate 19, height 6\' 4"  (1.93 m), weight (!) 169.6 kg, SpO2 100 %.  Medical Problem List and Plan: 1. 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  -patient may shower  -ELOS/Goals: 7-10 days, supervision to min assist, +/- at w/c level 2.  Antithrombotics: -DVT/anticoagulation:  Pharmaceutical: Lovenox  -antiplatelet therapy: N/A 3. Pain Management: Oxycodone prn.   -add muscle robaxin prn for spasms.  4. Mood/Behavior/Sleep: LCSW to follow  for evaluation and support.   -antipsychotic agents: N/A 5. Neuropsych/cognition: This patient is capable of making decisions on his own behalf. 6.  Skin/Wound Care: Monitor wound for healing  --limb protector when up  -he's post-op day #7. Will check with Dr Lajoyce Corners about removing vac 7. Fluids/Electrolytes/Nutrition: Monitor I/O. Continue Vitamin C and Zinc  --add juven additionally 8. T2DM: Hgb A1c- 10.3 and poorly controlled.  --dietician has added double portion as well as snacks TID to help with hunger/apptite  --monitor BS ac/hs and titrate insulin as indicated  --continue Insulin glargine 14 units with  9. Dilated CM of unclear etiology: On Lasix, Entresto (dose increased today) and spironolactone   --will add low salt restrictions. Monitor daily weights  --add low dose KCL due to recurrent hypokalemia requiring intermittent supplementation --recheck BMET 04/13 and on 04/15 as on spiro -pt is having nausea and ?diarrhea with coreg increase. Will convert him to metoprolol 25mg  bid to start--->titrate as needed. 10 HTN: Monitor BP TID--now on Lasix, Entresto and Spironolactone.  -metoprolol as above 11. Morbid obesity: Continue to educate on diet, exercise and compliance to help promote overall health and mobility.     Jacquelynn Cree, PA-C 08/22/2022

## 2022-08-22 NOTE — Progress Notes (Signed)
Ranelle Oyster, MD  Physician Physical Medicine and Rehabilitation   PMR Pre-admission    Signed   Date of Service: 08/17/2022  2:33 PM  Related encounter: ED to Hosp-Admission (Discharged) from 08/13/2022 in [facility]   Signed      Show:Clear all [x] Written[x] Templated[] Copied  Added by: [x] Standley Brooking, RN[x] Theodoro Kos, Lauren Demetrius Charity, CCC-SLP[x] Ranelle Oyster, MD  [] Hover for details PMR Admission Coordinator Pre-Admission Assessment   Patient: Alan Tapia. is an 47 y.o., male MRN: [id number] DOB: 1975/08/16 Height: 6\' 4"  (193 cm) Weight: (!) 169.6 kg   Insurance Information HMO:     PPO:      PCP:      IPA:      80/20:      OTHER:  PRIMARY: [employer] [place]      Policy#: [id number]      Subscriber: patient CM Name: [name]      Phone#: [phone]     Fax#: [phone] Pre-Cert#: [id number] approved for 7 days with update due 4/19 to [name] phone [phone] fax [phone]. End of month [name] will be on leave and 2nd CM will be [name] phone [phone] fax same of [phone]      Employer:  Benefits:  Phone #: [phone]     Name: 08/18/22 Eff. Date: 06/04/22-05/11/98   In network benefits only  Deduct: none      Out of Pocket Max: none      Life Max: none CIR: in network only 100%  single case agreement with [facility] made for CIR admit on 08/22/22    SNF: in network 100% Outpatient: in network 100%     Co-Pay:  Home Health: in network 100%      Co-Pay:  DME: in network 100%     Co-Pay:  Providers: in network   [name], [employer] has negotiated a single case agreement with [facility] for his CIR rehab admit only.  CIR Administrator, [name], has the contract details. CIR to give weekly updates and they will contract until end of April 2024. [place] then requests patient apply for [facility] Medicaid.  SECONDARY:       Policy#:      Phone#:     Artist:       Phone#:    Has applied for [employer] medicaid. Case worker [name] phone [phone]. TOC CM spoke with [name] on 4/9 at [phone]. States [name] Medicaid would have to terminate his coverage before can pursue [place] medicaid and disability.   I spoke with financial counselor for [facility] on 4/12. Requested she follow up with patient , not his wife on his [place] disability and Medicaid applications. She will have him sign rep form so she can follow up with [facility] Case manager to assist with disability and Medicaid applications.    Wife states he has [place] [employer] beginning 5/24 policy # [id number] phone [phone]. I spoke with wife on 4/12 and patient to inquire into the policy number is not a typical Healthy Blue number and that she can not just add him to her policy. I requested wife to follow up with her insurance broker to clarify what wife was trying to sign him up for.   The "Data Collection Information Summary" for patients in Inpatient Rehabilitation Facilities with attached "Privacy Act Statement-Health Care Records" was provided and verbally reviewed with: N/A   Emergency  Contact Information Contact Information       Name Relation Home Work Mobile    [name],[name] Spouse     [phone]    [name],[name] Father [phone]             Current Medical History  Patient Admitting Diagnosis: s/p L BKA   History of Present Illness: Pt is a 47 year old male with medical hx significant GNF:AOZHYQMVHQION, DM., HTN.  Pt presented to [facility] on 08/13/22 for wound evaluation. Pt reported increased pain, swelling, and yellow discharge from left foot. Pt had not received care for 1 month after his PICC line came out. X-ray of left foot was concerning for osteomyelitis. MRI showed septic arthritis, questionable abscess and worsening osteomyelitis. Code sepsis called. Orthopedics consulted. Pt transferred to  [facility] on 08/13/22. Pt underwent Left BKA and application of wound VAC by Dr. Lajoyce Corners on 08/15/22. Echo on 4/7 showed EF 35-40% with global hypokinesis.    Currently on pain control with scheduled Tylenol, scheduled Oxycodone and prn Dilaudid.    New diagnosis of systolic CHF. Has bilateral lymphedema. Cardiology consulted. Meds adjusted. Currently on Coreg, Lasix and Entresto. Plan repeat echo in 4 to 6 weeks. Continue to monitor for daily I and O, weight and BP, renal function and electrolytes. Hgb A1c January 12.3, Repeat ob 4/3 is 10.3, DM coordinator consulted. Currently on Semglee and SSI.    Patient's medical record from [facility] has been reviewed by the rehabilitation admission coordinator and physician.   Past Medical History      Past Medical History:  Diagnosis Date   Hypertension     Uncontrolled type 2 diabetes mellitus with hyperglycemia, with long-term current use of insulin 06/06/2022    Has the patient had major surgery during 100 days prior to admission? Yes   Family History   family history includes Allergies in his father; Arthritis in his mother; Diabetes in his father; Hypertension in his father.   Current Medications   Current Facility-Administered Medications:    acetaminophen (TYLENOL) tablet 1,000 mg, 1,000 mg, Oral, TID, Dahal, Binaya, MD, 1,000 mg at 08/22/22 0817   alum & mag hydroxide-simeth (MAALOX/MYLANTA) 200-200-20 MG/5ML suspension 15-30 mL, 15-30 mL, Oral, Q2H PRN, Nadara Mustard, MD   ascorbic acid (VITAMIN C) tablet 1,000 mg, 1,000 mg, Oral, Daily, Nadara Mustard, MD, 1,000 mg at 08/22/22 0835   bisacodyl (DULCOLAX) EC tablet 5 mg, 5 mg, Oral, Daily PRN, Nadara Mustard, MD   carvedilol (COREG) tablet 12.5 mg, 12.5 mg, Oral, BID WC, Pemberton, Heather E, MD, 12.5 mg at 08/22/22 0835   enoxaparin (LOVENOX) injection 80 mg, 80 mg, Subcutaneous, Q24H, Dahal, Binaya, MD, 80 mg at 08/21/22 1717   furosemide (LASIX) tablet 40 mg, 40 mg,  Oral, Daily, Pemberton, Heather E, MD, 40 mg at 08/22/22 0835   guaiFENesin-dextromethorphan (ROBITUSSIN DM) 100-10 MG/5ML syrup 15 mL, 15 mL, Oral, Q4H PRN, Nadara Mustard, MD   hydrALAZINE (APRESOLINE) injection 5 mg, 5 mg, Intravenous, Q20 Min PRN, Nadara Mustard, MD, 5 mg at 08/15/22 2201   hydrALAZINE (APRESOLINE) tablet 25 mg, 25 mg, Oral, Q6H PRN, Nadara Mustard, MD, 25 mg at 08/14/22 1600   hydrocortisone cream 1 % 1 Application, 1 Application, Topical, BID PRN, Nadara Mustard, MD   HYDROmorphone (DILAUDID) injection 1 mg, 1 mg, Intravenous, Q4H PRN, Dahal, Binaya, MD, 1 mg at 08/16/22 2152   insulin aspart (novoLOG) injection 0-5 Units, 0-5 Units, Subcutaneous, QHS, Lajoyce Corners,  Randa Evens, MD, 2 Units at 08/21/22 2033   insulin aspart (novoLOG) injection 0-9 Units, 0-9 Units, Subcutaneous, TID WC, Dahal, Binaya, MD, 1 Units at 08/22/22 0819   insulin glargine-yfgn (SEMGLEE) injection 14 Units, 14 Units, Subcutaneous, Daily, Dahal, Binaya, MD, 14 Units at 08/22/22 0905   labetalol (NORMODYNE) injection 10 mg, 10 mg, Intravenous, Q10 min PRN, Nadara Mustard, MD   LORazepam (ATIVAN) tablet 0.5 mg, 0.5 mg, Oral, PRN, Nadara Mustard, MD   LORazepam (ATIVAN) tablet 1 mg, 1 mg, Oral, PRN, Nadara Mustard, MD   magnesium sulfate IVPB 2 g 50 mL, 2 g, Intravenous, Daily PRN, Nadara Mustard, MD   metoprolol tartrate (LOPRESSOR) injection 2-5 mg, 2-5 mg, Intravenous, Q2H PRN, Nadara Mustard, MD   multivitamin with minerals tablet 1 tablet, 1 tablet, Oral, Daily, Nadara Mustard, MD, 1 tablet at 08/22/22 0835   ondansetron (ZOFRAN) tablet 4 mg, 4 mg, Oral, Q6H PRN **OR** ondansetron (ZOFRAN) injection 4 mg, 4 mg, Intravenous, Q6H PRN, Nadara Mustard, MD, 4 mg at 08/16/22 1130   oxyCODONE (Oxy IR/ROXICODONE) immediate release tablet 5 mg, 5 mg, Oral, Q6H PRN, Dahal, Binaya, MD, 5 mg at 08/21/22 2033   pantoprazole (PROTONIX) EC tablet 40 mg, 40 mg, Oral, Daily, Nadara Mustard, MD, 40 mg at 08/21/22 0937   phenol  (CHLORASEPTIC) mouth spray 1 spray, 1 spray, Mouth/Throat, PRN, Nadara Mustard, MD   polyethylene glycol (MIRALAX / GLYCOLAX) packet 17 g, 17 g, Oral, Daily PRN, Nadara Mustard, MD   sacubitril-valsartan (ENTRESTO) 49-51 mg per tablet, 1 tablet, Oral, BID, Dahal, Binaya, MD   spironolactone (ALDACTONE) tablet 25 mg, 25 mg, Oral, Daily, Pemberton, Heather E, MD, 25 mg at 08/21/22 1236   zinc sulfate capsule 220 mg, 220 mg, Oral, Daily, Nadara Mustard, MD, 220 mg at 08/22/22 0836   Patients Current Diet:  Diet Order                  Diet - low sodium heart healthy             Diet Carb Modified             Diet Carb Modified Fluid consistency: Thin; Room service appropriate? Yes  Diet effective now                       Precautions / Restrictions Precautions Precautions: Fall, Knee Precaution Booklet Issued: Yes (comment) Precaution Comments: Reviewed prec Other Brace: LLE brace Restrictions Weight Bearing Restrictions: Yes LLE Weight Bearing: Non weight bearing    Has the patient had 2 or more falls or a fall with injury in the past year? No   Prior Activity Level Community (5-7x/wk): drives, gets out of house daily   Prior Functional Level Self Care: Did the patient need help bathing, dressing, using the toilet or eating? Needed some help   Indoor Mobility: Did the patient need assistance with walking from room to room (with or without device)? Independent   Stairs: Did the patient need assistance with internal or external stairs (with or without device)? Independent   Functional Cognition: Did the patient need help planning regular tasks such as shopping or remembering to take medications? Needed some help   Patient Information Are you of Hispanic, Latino/a,or Spanish origin?: A. No, not of Hispanic, Latino/a, or Spanish origin What is your race?: B. Black or African American Do you need or want an interpreter to communicate with  a doctor or health care staff?: 0.  No   Patient's Response To:  Health Literacy and Transportation Is the patient able to respond to health literacy and transportation needs?: Yes Health Literacy - How often do you need to have someone help you when you read instructions, pamphlets, or other written material from your doctor or pharmacy?: Never In the past 12 months, has lack of transportation kept you from medical appointments or from getting medications?: No In the past 12 months, has lack of transportation kept you from meetings, work, or from getting things needed for daily living?: No   Home Assistive Devices / Equipment Home Assistive Devices/Equipment: Jeannette How (specify type) Home Equipment: Cane - quad   Prior Device Use: Indicate devices/aids used by the patient prior to current illness, exacerbation or injury? Walker and cane   Current Functional Level Cognition   Overall Cognitive Status: Within Functional Limits for tasks assessed Orientation Level: Oriented X4 General Comments: Very motivated to participate in therapy. Pt believing that he should be doing more and benefits from education regarding expected progression    Extremity Assessment (includes Sensation/Coordination)   Upper Extremity Assessment: Overall WFL for tasks assessed  Lower Extremity Assessment: Defer to PT evaluation LLE Deficits / Details: Wearing amputee splint LLE: Unable to fully assess due to pain, Unable to fully assess due to immobilization     ADLs   Overall ADL's : Needs assistance/impaired Eating/Feeding: Modified independent, Sitting Grooming: Set up, Sitting Upper Body Bathing: Set up, Sitting Lower Body Bathing: Maximal assistance, Sitting/lateral leans Upper Body Dressing : Set up, Sitting Upper Body Dressing Details (indicate cue type and reason): new gown Lower Body Dressing: Supervision/safety, Sitting/lateral leans Lower Body Dressing Details (indicate cue type and reason): Reviewing techniques for LB ADL  with pt. PT with greater comfort performing ADL pivoting to get RLE into bed with him rather than crossing R over L Toilet Transfer: Rolling walker (2 wheels), Moderate assistance, +2 for physical assistance, +2 for safety/equipment (stedy and RW attempts at STS see transfer comments) Toilet Transfer Details (indicate cue type and reason): STS Functional mobility during ADLs: Moderate assistance, Minimal assistance, +2 for physical assistance, +2 for safety/equipment, Rolling walker (2 wheels) (stedy) General ADL Comments: Limited by balance, pain, decr strength     Mobility   Overal bed mobility: Needs Assistance Bed Mobility: Sit to Supine Supine to sit: Supervision Sit to supine: Supervision General bed mobility comments: Pt able to manage BLE elevation to EOB and reposition in bed with supervision.     Transfers   Overall transfer level: Needs assistance Equipment used: Rolling walker (2 wheels), Ambulation equipment used Transfers: Sit to/from Stand Sit to Stand: Min assist, +2 physical assistance, +2 safety/equipment, From elevated surface, Mod assist Bed to/from chair/wheelchair/BSC transfer type:: Squat pivot Squat pivot transfers: Min assist  Lateral/Scoot Transfers: Supervision General transfer comment: Heavy focus on STS transfers this session. Initially performing with stedy to optimize anterior weight shift. Performing 4 mini squats with bringing bottom off of bed with stedy. Then transitioning to full stand 2x with stedy with min A for boost and mod A for stedy with +2 A at all times for safety. Pt with good safety awareness and tolerance of standing, so transitioned to RW and pt continues to require min A +2 for rise and mod A +2 to stedy for 3 additional attempts     Ambulation / Gait / Stairs / Wheelchair Mobility   Ambulation/Gait Pre-gait activities: Pivoting R foot laterally (heel-toe)  in standing with RW support     Posture / Balance Dynamic Sitting Balance Sitting  balance - Comments: sitting EOB Balance Overall balance assessment: Needs assistance Sitting-balance support: No upper extremity supported, Feet supported Sitting balance-Leahy Scale: Good Sitting balance - Comments: sitting EOB Standing balance support: Bilateral upper extremity supported, Reliant on assistive device for balance Standing balance-Leahy Scale: Poor Standing balance comment: standing up to 30 seconds with min guard A with BUE supported on RW. Pt attempting to pivot on RLE with RW support and able to pivot ~2 in.     Special needs/care consideration Wound VAC    Previous Home Environment  Living Arrangements: Spouse/significant other, Children (three children 1-16 y.o)  Lives With: Significant other Available Help at Discharge: Family, Available 24 hours/day Type of Home: Other(Comment) (townhouse) Home Layout: Two level, 1/2 bath on main level Alternate Level Stairs-Rails: Left Alternate Level Stairs-Number of Steps: 13 Home Access: Stairs to enter Entrance Stairs-Rails: Right, Left Entrance Stairs-Number of Steps: 2 Bathroom Shower/Tub: Engineer, manufacturing systems: Standard Bathroom Accessibility: Yes How Accessible: Accessible via walker Home Care Services: No   Discharge Living Setting Plans for Discharge Living Setting: Patient's home Type of Home at Discharge: Other (Comment) (townhouse) Discharge Home Layout: Two level, 1/2 bath on main level Alternate Level Stairs-Rails: Left Alternate Level Stairs-Number of Steps: 13 Discharge Home Access: Stairs to enter Entrance Stairs-Rails: Right, Left Entrance Stairs-Number of Steps: 2 Discharge Bathroom Shower/Tub: Tub/shower unit Discharge Bathroom Toilet: Standard Discharge Bathroom Accessibility: Yes How Accessible: Accessible via walker Does the patient have any problems obtaining your medications?: No   Social/Family/Support Systems Anticipated Caregiver: Alan Tapia, wife Anticipated  Caregiver's Contact Information: [phone] Caregiver Availability: 24/7 Discharge Plan Discussed with Primary Caregiver: Yes Is Caregiver In Agreement with Plan?: Yes Does Caregiver/Family have Issues with Lodging/Transportation while Pt is in Rehab?: No   Was living separate form wife as her was 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 Now to live with wife in [place]. She has contacted Housing authority to add him to their lease and to request handicapped housing.   Goals Patient/Family Goal for Rehab: Supervision-Min A:PT/OT Expected length of stay: 7- 10 days Pt/Family Agrees to Admission and willing to participate: Yes Program Orientation Provided & Reviewed with Pt/Caregiver Including Roles  & Responsibilities: Yes   Decrease burden of Care through IP rehab admission: NA   Possible need for SNF placement upon discharge: Not anticipated   Patient Condition: I have reviewed medical records from [facility], spoken with CM, and patient and spouse. I met with patient at the bedside for inpatient rehabilitation assessment.  Patient will benefit from ongoing PT and OT, can actively participate in 3 hours of therapy a day 5 days of the week, and can make measurable gains during the admission.  Patient will also benefit from the coordinated team approach during an Inpatient Acute Rehabilitation admission.  The patient will receive intensive therapy as well as Rehabilitation physician, nursing, social worker, and care management interventions.  Due to safety, skin/wound care, disease management, medication administration, pain management, and patient education the patient requires 24 hour a day rehabilitation nursing.  The patient is currently min to mod assist overall with mobility and basic ADLs.  Discharge setting and therapy post discharge at home with home health is anticipated.  Patient has agreed to participate in the Acute Inpatient Rehabilitation Program and will admit  today.   Preadmission Screen Completed By:  Ottie Glazier RN MSN 08/22/2022 12:10  PM ______________________________________________________________________   Discussed status with Dr. Riley Kill on 08/22/2022 at 1210 and received approval for admission today.   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 Yes Co-Morbidities requiring supervision/potential complications: lymphedema, DM, HTN Due to bladder management, bowel management, safety, skin/wound care, disease management, medication administration, pain management, and patient education, does the patient require 24 hr/day rehab nursing? Yes Does the patient require coordinated care of a physician, rehab nurse, PT, OT to address physical and functional deficits in the context of the above medical diagnosis(es)? Yes Addressing deficits in the following areas: balance, endurance, locomotion, strength, transferring, bowel/bladder control, bathing, dressing, feeding, grooming, toileting, and psychosocial support Can the patient actively participate in an intensive therapy program of at least 3 hrs of therapy 5 days a week? Yes The potential for patient to make measurable gains while on inpatient rehab is excellent Anticipated functional outcomes upon discharge from inpatient rehab: supervision and min assist PT, supervision and min assist OT, n/a SLP Estimated rehab length of stay to reach the above functional goals is: 7-10 days Anticipated discharge destination: Home 10. Overall Rehab/Functional Prognosis: excellent     MD Signature: Ranelle Oyster, MD, [facility] Physical Medicine & Rehabilitation Medical Director Rehabilitation Services 08/22/2022           Revision History

## 2022-08-22 NOTE — Progress Notes (Signed)
Occupational Therapy Treatment Patient Details Name: Alan Tapia. MRN: [id number] DOB: 07/27/1975 Today's Date: 08/22/2022   History of present illness Pt is a 47 y.o. male admitted 4/3 with 6 mo history of L foot ulcer; Presenting with swelling and progressive yellow discharge from the foot. 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 PMH significant for DMII, HTN.   OT comments  Pt with good progress toward established OT goals. Focus session on ADL and transfer training. Pt able to perform LB ADL with supervision to don sock/shoe. Pt demonstrating greater awareness of residual limb desensitization. Pt performing mini squats and STS transfers this session with greater independence and activity tolerance as compared to previous sessions. Pt benefiting from cues for hand placement to optimize technique initially. Pt with posterior LOB and thus up to Mod A +2 for steadying in standing, but once steady able to progress to min guard A statically. Pt educated regarding progression of transfers and ADL on request. Continuing to highly recommend inpatient rehabilitation >3 hours/day to optimize safety and independence in ADL and IADL.    Recommendations for follow up therapy are one component of a multi-disciplinary discharge planning process, led by the attending physician.  Recommendations may be updated based on patient status, additional functional criteria and insurance authorization.    Assistance Recommended at Discharge Frequent or constant Supervision/Assistance  Patient can return home with the following  Two people to help with walking and/or transfers;Two people to help with bathing/dressing/bathroom;Assistance with cooking/housework;Assist for transportation;Help with stairs or ramp for entrance   Equipment Recommendations  Other (comment) (Pt in process of getting ramp to access home; defer other eqiupment needs to next venue)    Recommendations for Other Services Rehab consult    Precautions /  Restrictions Precautions Precautions: Fall;Knee Precaution Booklet Issued: Yes (comment) Precaution Comments: Reviewed prec Required Braces or Orthoses: Other Brace Other Brace: LLE brace Restrictions Weight Bearing Restrictions: Yes LLE Weight Bearing: Non weight bearing       Mobility Bed Mobility Overal bed mobility: Needs Assistance Bed Mobility: Supine to Sit, Sit to Supine     Supine to sit: Supervision Sit to supine: Supervision   General bed mobility comments: Making transitions to EOB easier today, still heavily reliant on rail but without physical intervention today. Cues for safety and awareness of Lt residual limb. Obersved to use momentum to progress to EOB and back into bed    Transfers Overall transfer level: Needs assistance Equipment used: Rolling walker (2 wheels), Ambulation equipment used Transfers: Sit to/from Stand Sit to Stand: Min assist, +2 physical assistance, +2 safety/equipment, From elevated surface           General transfer comment: Heavy focus on STS transfers this session. Initially performing with stedy to optimize anterior weight shift. Performing 4 mini squats with bringing bottom off of bed. Then transitioning to full stand 2x with stedy with min A for boost and mod A for stedy with +2 A at all times for safety. Pt with good safety awareness and tolerance of standing, so transitioned to RW and pt continues to require min A +2 for rise and mod A +2 to stedy for 3 additional attempts     Balance Overall balance assessment: Needs assistance Sitting-balance support: No upper extremity supported, Feet supported Sitting balance-Leahy Scale: Good     Standing balance support: Bilateral upper extremity supported, Reliant on assistive device for balance Standing balance-Leahy Scale: Poor Standing balance comment: standing up to 30 seconds with min guard A with BUE  supported on RW. Pt attempting to pivot on RLE with RW support and able to pivot  ~2 in.                           ADL either performed or assessed with clinical judgement   ADL Overall ADL's : Needs assistance/impaired                     Lower Body Dressing: Supervision/safety;Sitting/lateral leans Lower Body Dressing Details (indicate cue type and reason): Reviewing techniques for LB ADL with pt. PT with greater comfort performing ADL pivoting to get RLE into bed with him rather than crossing R over L Toilet Transfer: Rolling walker (2 wheels);Moderate assistance;+2 for physical assistance;+2 for safety/equipment (stedy and RW attempts at STS see transfer comments) Toilet Transfer Details (indicate cue type and reason): STS         Functional mobility during ADLs: Moderate assistance;Minimal assistance;+2 for physical assistance;+2 for safety/equipment;Rolling walker (2 wheels) (stedy)      Extremity/Trunk Assessment Upper Extremity Assessment Upper Extremity Assessment: Overall WFL for tasks assessed   Lower Extremity Assessment Lower Extremity Assessment: Defer to PT evaluation        Vision       Perception     Praxis      Cognition Arousal/Alertness: Awake/alert Behavior During Therapy: WFL for tasks assessed/performed Overall Cognitive Status: Within Functional Limits for tasks assessed                                 General Comments: Very motivated to participate in therapy. Pt believing that he should be doing more and benefits from education regarding expected progression        Exercises Exercises: Other exercises Other Exercises Other Exercises: mini squats x4, STS x7.    Shoulder Instructions       General Comments LLE limb guard donned throughout session    Pertinent Vitals/ Pain       Pain Assessment Pain Assessment: 0-10 Pain Score: 6  Pain Location: reslidual limb with mobility Pain Descriptors / Indicators: Aching, Throbbing (+ phantom limb pain) Pain Intervention(s): Limited  activity within patient's tolerance, Monitored during session, Repositioned, Premedicated before session  Home Living                                          Prior Functioning/Environment              Frequency  Min 2X/week        Progress Toward Goals  OT Goals(current goals can now be found in the care plan section)     Acute Rehab OT Goals Patient Stated Goal: get better and stronger more quickly with more rehabilitation OT Goal Formulation: With patient Time For Goal Achievement: 08/30/22 Potential to Achieve Goals: Good  Plan      Co-evaluation    PT/OT/SLP Co-Evaluation/Treatment: Yes Reason for Co-Treatment: For patient/therapist safety;To address functional/ADL transfers PT goals addressed during session: Mobility/safety with mobility;Balance;Proper use of DME OT goals addressed during session: Strengthening/ROM;ADL's and self-care;Proper use of Adaptive equipment and DME      AM-PAC OT "6 Clicks" Daily Activity     Outcome Measure   Help from another person eating meals?: None Help from another person taking care of personal grooming?:  A Little Help from another person toileting, which includes using toliet, bedpan, or urinal?: A Lot Help from another person bathing (including washing, rinsing, drying)?: A Lot Help from another person to put on and taking off regular upper body clothing?: A Little Help from another person to put on and taking off regular lower body clothing?: A Little 6 Click Score: 17    End of Session Equipment Utilized During Treatment: Gait belt;Rolling walker (2 wheels)  OT Visit Diagnosis: Unsteadiness on feet (R26.81);Other abnormalities of gait and mobility (R26.89);Muscle weakness (generalized) (M62.81);Pain Pain - Right/Left: Left Pain - part of body: Leg   Activity Tolerance Patient tolerated treatment well   Patient Left in bed;with call bell/phone within reach;with family/visitor present   Nurse  Communication Mobility status        Time: 9528-4132 OT Time Calculation (min): 54 min  Charges: OT General Charges $OT Visit: 1 Visit OT Treatments $Self Care/Home Management : 23-37 mins  Tyler Deis, OTR/L [facility] Acute Rehabilitation Office: [phone]   Myrla Halsted 08/22/2022, 10:49 AM

## 2022-08-22 NOTE — Progress Notes (Signed)
Inpatient Rehabilitation Admissions Coordinator   I have Insurance approval with [facility] and can admit to CIR today. I met with patient and wife and they are aware and in agreement. I will alert Acute team and TOC to make the arrangements.  Ottie Glazier, RN, MSN Rehab Admissions Coordinator [phone] 08/22/2022 11:27 AM

## 2022-08-22 NOTE — Progress Notes (Signed)
Patient ID: Alan Tapia., male   DOB: 04-06-1976, 47 y.o.   MRN: [id number]  INPATIENT REHABILITATION ADMISSION NOTE   Arrival Method: bed     Mental Orientation: x4   Assessment: completed   Skin: L BKA with wound vac in place   IV'S: n/a   Pain: none reported   Tubes and Drains: n/a   Safety Measures: in place   Vital Signs: see flowsheet   Height and Weight: documented   Rehab Orientation: completed   Family: at bedside   Notes: Excited about starting therapy. Great family support.

## 2022-08-22 NOTE — H&P (Signed)
Physical Medicine and Rehabilitation Admission H&P        Chief Complaint  Patient presents with   Functional deficits due to L-BKA      HPI:  Alan Tapia is a 47 year old male with history of HTN, T2DM, morbid obesity- BMI, left foot wound X 2 years w/p ray amp and last debrided earlier this year with recommendations of IV Vanc/Zosyn but PICC fell out and he had to come to [place] due to family emergency and had missed antibiotics X 4 weeks.  He was admitted on 08/13/22 with increase swelling, increase in drainage and MRI of foot done showing large plantar wound with fluid collection/abscess or coalescent edema, osteomyelitis of 5th proximal phalanx and entire 5 MT, septic arthritis v/s osteo 4th MTP joint and question of osteo of cuboid and at post amp site.  He was started on IV antibiotics for acute on chronic osteomyelitis and underwent L-BKA on 08/15/22 by Dr. Lajoyce Corners.    2D echo done due to chronic BLE lymphedema as well as reports of SOB and intermittent CP.  This revealed EF 35-40% with global hypokinesis with mild dilation of LV/LA and dilated inferior vena cava.he was started on GDMT due to suspicion of CHF and Dr. Shari Prows was consulted for input. She recommended multiple medication changes and recommended not adding SGLT2i until DM better controlled. He is to follow up in office for repeat echo in 4-6 weeks and if EF remains depressed; will need ischemia work up.  RD and HF pharmacy have been following for education/input.  Meal coverage added for better BS control per diabetes coordinator's input. Patient was independent PTA. PT/OT has been working with patient who continues to require mod A+2 to stand for 30 seconds, has LOB posteriorly and attempting pivot transfers. CIR recommended due to functional decline.        Review of Systems  Constitutional:  Positive for malaise/fatigue. Negative for chills.  HENT:  Negative for hearing loss.   Eyes:  Negative for blurred vision.   Respiratory:  Negative for cough and shortness of breath.   Cardiovascular:  Positive for leg swelling. Negative for chest pain.  Gastrointestinal:  Positive for abdominal pain, diarrhea and nausea.  Genitourinary:  Positive for frequency.  Musculoskeletal:  Positive for joint pain and myalgias.  Skin:  Negative for rash.  Neurological:  Positive for dizziness.  Psychiatric/Behavioral:  Negative for suicidal ideas.             Past Medical History:  Diagnosis Date   Hypertension     Uncontrolled type 2 diabetes mellitus with hyperglycemia, with long-term current use of insulin 06/06/2022           Past Surgical History:  Procedure Laterality Date   AMPUTATION Left 08/15/2022    Procedure: LEFT BELOW KNEE AMPUTATION;  Surgeon: Nadara Mustard, MD;  Location: [facility] OR;  Service: Orthopedics;  Laterality: Left;           Family History  Problem Relation Age of Onset   Arthritis Mother     Allergies Father     Hypertension Father     Diabetes Father        Social History:  reports that he has never smoked. He has never used smokeless tobacco. He reports that he does not drink alcohol and does not use drugs.          Allergies  Allergen Reactions   Fish Allergy Anaphylaxis, Hives and Other (See Comments)   Benadryl [  Diphenhydramine] Swelling      Pt reports cannot take pill form of benadryl   Vancomycin        Redman's-type rash on 08/13/22            Medications Prior to Admission  Medication Sig Dispense Refill   ibuprofen (ADVIL,MOTRIN) 200 MG tablet Take 800 mg by mouth daily.       insulin glargine (LANTUS) 100 UNIT/ML injection Inject 3 Units into the skin daily. Injecting 3 units once daily       insulin lispro (HUMALOG) 100 UNIT/ML KwikPen Inject 6 Units into the skin 3 (three) times daily.       vancomycin IVPB Inject 1,000 mg into the vein.       oxycodone (OXY-IR) 5 MG capsule Take 5 mg by mouth every 4 (four) hours as needed for pain. Q4-6 hours (Patient not  taking: Reported on 08/15/2022)              Home: Home Living Family/patient expects to be discharged to:: Private residence Living Arrangements: Spouse/significant other, Children (three children 1-16 y.o) Available Help at Discharge: Family, Available 24 hours/day Type of Home: Other(Comment) (townhouse) Home Access: Stairs to enter [employer] of Steps: 2 Entrance Stairs-Rails: Right, Left Home Layout: Two level, 1/2 bath on main level Alternate Level Stairs-Number of Steps: 13 Alternate Level Stairs-Rails: Left Bathroom Shower/Tub: Engineer, manufacturing systems: Standard Bathroom Accessibility: Yes Home Equipment: Cane - quad  Lives With: Significant other   Functional History: Prior Function Prior Level of Function : Needs assist Mobility Comments: Using quad cane for ambulation, no falls. ADLs Comments: Needed help from wife extensively, assisted with dressing, wound dressing, manages finances. He was able to drive. Previously taught middle school special needs   Functional Status:  Mobility: Bed Mobility Overal bed mobility: Needs Assistance Bed Mobility: Sit to Supine Supine to sit: Supervision Sit to supine: Supervision General bed mobility comments: Pt able to manage BLE elevation to EOB and reposition in bed with supervision. Transfers Overall transfer level: Needs assistance Equipment used: Rolling walker (2 wheels), Ambulation equipment used Transfers: Sit to/from Stand Sit to Stand: Min assist, +2 physical assistance, +2 safety/equipment, From elevated surface, Mod assist Bed to/from chair/wheelchair/BSC transfer type:: Squat pivot Squat pivot transfers: Min assist  Lateral/Scoot Transfers: Supervision General transfer comment: Heavy focus on STS transfers this session. Initially performing with stedy to optimize anterior weight shift. Performing 4 mini squats with bringing bottom off of bed with stedy. Then transitioning to full stand 2x with  stedy with min A for boost and mod A for stedy with +2 A at all times for safety. Pt with good safety awareness and tolerance of standing, so transitioned to RW and pt continues to require min A +2 for rise and mod A +2 to stedy for 3 additional attempts Ambulation/Gait Pre-gait activities: Pivoting R foot laterally (heel-toe) in standing with RW support   ADL: ADL Overall ADL's : Needs assistance/impaired Eating/Feeding: Modified independent, Sitting Grooming: Set up, Sitting Upper Body Bathing: Set up, Sitting Lower Body Bathing: Maximal assistance, Sitting/lateral leans Upper Body Dressing : Set up, Sitting Upper Body Dressing Details (indicate cue type and reason): new gown Lower Body Dressing: Supervision/safety, Sitting/lateral leans Lower Body Dressing Details (indicate cue type and reason): Reviewing techniques for LB ADL with pt. PT with greater comfort performing ADL pivoting to get RLE into bed with him rather than crossing R over L Toilet Transfer: Rolling walker (2 wheels), Moderate assistance, +2 for physical  assistance, +2 for safety/equipment (stedy and RW attempts at STS see transfer comments) Toilet Transfer Details (indicate cue type and reason): STS Functional mobility during ADLs: Moderate assistance, Minimal assistance, +2 for physical assistance, +2 for safety/equipment, Rolling walker (2 wheels) (stedy) General ADL Comments: Limited by balance, pain, decr strength   Cognition: Cognition Overall Cognitive Status: Within Functional Limits for tasks assessed Orientation Level: Oriented X4 Cognition Arousal/Alertness: Awake/alert Behavior During Therapy: WFL for tasks assessed/performed Overall Cognitive Status: Within Functional Limits for tasks assessed General Comments: Very motivated to participate in therapy. Pt believing that he should be doing more and benefits from education regarding expected progression     Blood pressure (!) 171/97, pulse 87, temperature  97.7 F (36.5 C), temperature source Oral, resp. rate 19, height  (1.93 m), weight (!) 169.6 kg, SpO2 100 %. Physical Exam Constitutional:      General: He is not in acute distress.    Appearance: He is obese.  HENT:     Head: Normocephalic.     Nose: Nose normal.     Mouth/Throat:     Mouth: Mucous membranes are moist.  Eyes:     Extraocular Movements: Extraocular movements intact.     Conjunctiva/sclera: Conjunctivae normal.     Pupils: Pupils are equal, round, and reactive to light.  Cardiovascular:     Rate and Rhythm: Normal rate and regular rhythm.     Heart sounds: No murmur heard. Pulmonary:     Effort: Pulmonary effort is normal. No respiratory distress.     Breath sounds: No wheezing.  Abdominal:     General: Bowel sounds are normal. There is no distension.     Palpations: Abdomen is soft.  Musculoskeletal:     Cervical back: Normal range of motion.     Comments: Chronic lymphedema RLE. LLE in Vac/dressing with shrinker over top  Skin:    Comments: Chronic changes distal RLE d/t lymphedema. No sign of breakdown. Left BKA with vac in place, small amount serosanguinous blood in cannister.   Neurological:     Comments: Alert and oriented x 3. Normal insight and awareness. Intact Memory. Normal language and speech. Cranial nerve exam unremarkable. MMT: UE 5/5 prox to distal. RLE 4/5 prox to distal. LLE: 2/5 HF. Sensory exam appears grossly normal for light touch and pain in all 4 limbs. No limb ataxia or cerebellar signs. No abnormal tone appreciated.  Marland Kitchen    Psychiatric:        Mood and Affect: Mood normal.        Behavior: Behavior normal.        Lab Results Last 48 Hours        Results for orders placed or performed during the hospital encounter of 08/13/22 (from the past 48 hour(s))  Glucose, capillary     Status: Abnormal    Collection Time: 08/20/22  4:37 PM  Result Value Ref Range    Glucose-Capillary 134 (H) 70 - 99 mg/dL      Comment: Glucose reference  range applies only to samples taken after fasting for at least 8 hours.  Glucose, capillary     Status: Abnormal    Collection Time: 08/20/22  9:22 PM  Result Value Ref Range    Glucose-Capillary 160 (H) 70 - 99 mg/dL      Comment: Glucose reference range applies only to samples taken after fasting for at least 8 hours.  Basic metabolic panel     Status: Abnormal    Collection  Time: 08/21/22  2:03 AM  Result Value Ref Range    Sodium 135 135 - 145 mmol/L    Potassium 3.4 (L) 3.5 - 5.1 mmol/L    Chloride 102 98 - 111 mmol/L    CO2 26 22 - 32 mmol/L    Glucose, Bld 129 (H) 70 - 99 mg/dL      Comment: Glucose reference range applies only to samples taken after fasting for at least 8 hours.    BUN 6 6 - 20 mg/dL    Creatinine, Ser 4.09 0.61 - 1.24 mg/dL    Calcium 7.7 (L) 8.9 - 10.3 mg/dL    GFR, Estimated >81 >19 mL/min      Comment: (NOTE) Calculated using the CKD-EPI Creatinine Equation (2021)      Anion gap 7 5 - 15      Comment: Performed at [facility], [address], [place], [name] [address]  Glucose, capillary     Status: Abnormal    Collection Time: 08/21/22  8:25 AM  Result Value Ref Range    Glucose-Capillary 110 (H) 70 - 99 mg/dL      Comment: Glucose reference range applies only to samples taken after fasting for at least 8 hours.  Glucose, capillary     Status: Abnormal    Collection Time: 08/21/22 11:20 AM  Result Value Ref Range    Glucose-Capillary 168 (H) 70 - 99 mg/dL      Comment: Glucose reference range applies only to samples taken after fasting for at least 8 hours.  Glucose, capillary     Status: Abnormal    Collection Time: 08/21/22  4:19 PM  Result Value Ref Range    Glucose-Capillary 167 (H) 70 - 99 mg/dL      Comment: Glucose reference range applies only to samples taken after fasting for at least 8 hours.  Glucose, capillary     Status: Abnormal    Collection Time: 08/21/22  8:04 PM  Result Value Ref Range    Glucose-Capillary 209 (H) 70 -  99 mg/dL      Comment: Glucose reference range applies only to samples taken after fasting for at least 8 hours.  Basic metabolic panel     Status: Abnormal    Collection Time: 08/22/22  2:36 AM  Result Value Ref Range    Sodium 137 135 - 145 mmol/L    Potassium 3.9 3.5 - 5.1 mmol/L    Chloride 106 98 - 111 mmol/L    CO2 24 22 - 32 mmol/L    Glucose, Bld 174 (H) 70 - 99 mg/dL      Comment: Glucose reference range applies only to samples taken after fasting for at least 8 hours.    BUN 6 6 - 20 mg/dL    Creatinine, Ser 9.56 0.61 - 1.24 mg/dL    Calcium 7.8 (L) 8.9 - 10.3 mg/dL    GFR, Estimated >21 >30 mL/min      Comment: (NOTE) Calculated using the CKD-EPI Creatinine Equation (2021)      Anion gap 7 5 - 15      Comment: Performed at [facility], [address] [place], [name] [address]  Glucose, capillary     Status: Abnormal    Collection Time: 08/22/22  7:15 AM  Result Value Ref Range    Glucose-Capillary 141 (H) 70 - 99 mg/dL      Comment: Glucose reference range applies only to samples taken after fasting for at  least 8 hours.  Glucose, capillary     Status: Abnormal    Collection Time: 08/22/22 11:47 AM  Result Value Ref Range    Glucose-Capillary 144 (H) 70 - 99 mg/dL      Comment: Glucose reference range applies only to samples taken after fasting for at least 8 hours.      Imaging Results (Last 48 hours)  No results found.         Blood pressure (!) 171/97, pulse 87, temperature 97.7 F (36.5 C), temperature source Oral, resp. rate 19, height  (1.93 m), weight (!) 169.6 kg, SpO2 100 %.   Medical Problem List and Plan: 1. 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             -patient may shower             -ELOS/Goals: 7-10 days, supervision to min assist, +/- at w/c level 2.  Antithrombotics: -DVT/anticoagulation:  Pharmaceutical: Lovenox             -antiplatelet therapy: N/A 3. Pain  Management: Oxycodone prn.              -add muscle robaxin prn for spasms.  4. Mood/Behavior/Sleep: LCSW to follow for evaluation and support.              -antipsychotic agents: N/A 5. Neuropsych/cognition: This patient is capable of making decisions on his own behalf. 6. Skin/Wound Care: Monitor wound for healing             --limb protector when up             -he's post-op day #7. Spoke with Dr Lajoyce Corners who recommended changing vac to prevena and continuing for another week.  7. Fluids/Electrolytes/Nutrition: Monitor I/O. Continue Vitamin C and Zinc             --add juven additionally 8. T2DM: Hgb A1c- 10.3 and poorly controlled.             --dietician has added double portion as well as snacks TID to help with hunger/apptite             --monitor BS ac/hs and titrate insulin as indicated             --continue Insulin glargine 14 units with  9. Dilated CM of unclear etiology: On Lasix, Entresto (dose increased today) and spironolactone              --will add low salt restrictions. Monitor daily weights             --add low dose KCL due to recurrent hypokalemia requiring intermittent supplementation --recheck BMET 04/13 and on 04/15 as on spiro -pt is having nausea and ?diarrhea with coreg increase. Will convert him to metoprolol  bid to start--->titrate as needed. 10 HTN: Monitor BP TID--now on Lasix, Entresto and Spironolactone.             -metoprolol as above 11. Morbid obesity: Continue to educate on diet, exercise and compliance to help promote overall health and mobility.        Jacquelynn Cree, PA-C 08/22/2022

## 2022-08-22 NOTE — Progress Notes (Addendum)
   [name]   PCP: Patient, No Pcp Per PCP-Cardiologist: None    HPI:  47 yo M with PMH of chronic osteomyelitis, T2DM, and HTN.   Presented to the ED on 4/3 with L foot infection. Reports he was being treated for osteomyelitis in [name] but PICC has fallen out and he was unable to get vancomycin for 4 weeks. Recently moved to [facility]. In the ED, MRI showed septic arthritis with possible abscess and continued/worsening osteomyelitis. Ortho consulted and L BKA was done on 4/5. Continued LE edema on R leg. ECHO on 4/7 showed EF reduced to 35-40%, global hypokinesis, RV normal. Possible CIR at discharge.   Patient states he is still having difficulty with tolerating carvedilol.  Current HF Medications: Diuretic: furosemide 40 mg PO daily Beta Blocker: carvedilol 12.5 mg BID ACE/ARB/ARNI: Entresto 24/26 mg BID MRA: spironolactone 25 mg daily  Prior to admission HF Medications: None  Pertinent Lab Values: Serum creatinine 0.94, BUN 6, Potassium 3.9, Sodium 137, Magnesium 1.7, A1c 10.3   Vital Signs: Weight: 374 lbs Blood pressure: 160-170/90s  Heart rate: 70-90s  I/O: incomplete  Medication Assistance / Insurance Benefits Check: Does the patient have prescription insurance?  Yes Type of insurance plan: out of state Medicaid > BCBS commercial plan starting 5/1  Outpatient Pharmacy:  Prior to admission outpatient [facility] Is the patient willing to use [facility] TOC pharmacy at discharge? Yes Is the patient willing to transition their outpatient pharmacy to utilize a [facility] outpatient pharmacy?   Pending    Assessment: 1. Acute systolic CHF (LVEF 35-40%). NYHA class II symptoms. - Continue furosemide 40 mg PO daily. Strict I/Os and daily weights. Keep K>4 and Mg>2.  - On carvedilol 12.5 mg BID. Patient stated he was able to tolerate low dose coreg but when the dose was increased, he began to have issues with it. Discussed stopping  carvedilol for a day and starting at a lower dose vs starting metoprolol XL. Discussed with Dr. Hermelinda Medicus. - On Entresto 24/26 mg BID - BP still uncontrolled, recommend increasing to 49/51 mg BID today - Continue spironolactone 25 mg daily  - Caution SGLT2i with A1c >10   Plan: 1) Medication changes recommended at this time: - Increase Entresto to 49/51 mg BID - May need to hold carvedilol and restart at lower dose vs start metoprolol  2) Patient assistance: - Has out of state Medicaid  - Case management discussed with patient and his wife - he has a Insurance underwriter starting on 5/1  3)  Education  - Patient has been educated on current HF medications and potential additions to HF medication regimen - Patient verbalizes understanding that over the next few months, these medication doses may change and more medications may be added to optimize HF regimen - Patient has been educated on basic disease state pathophysiology and goals of therapy   Sharen Hones, PharmD, BCPS Heart Failure Engineer, building services Phone [phone]

## 2022-08-23 DIAGNOSIS — I10 Essential (primary) hypertension: Secondary | ICD-10-CM

## 2022-08-23 DIAGNOSIS — G4700 Insomnia, unspecified: Secondary | ICD-10-CM

## 2022-08-23 DIAGNOSIS — R739 Hyperglycemia, unspecified: Secondary | ICD-10-CM | POA: Diagnosis not present

## 2022-08-23 DIAGNOSIS — G548 Other nerve root and plexus disorders: Secondary | ICD-10-CM

## 2022-08-23 DIAGNOSIS — R52 Pain, unspecified: Secondary | ICD-10-CM

## 2022-08-23 DIAGNOSIS — S88112D Complete traumatic amputation at level between knee and ankle, left lower leg, subsequent encounter: Secondary | ICD-10-CM | POA: Diagnosis not present

## 2022-08-23 LAB — CBC WITH DIFFERENTIAL/PLATELET
Abs Immature Granulocytes: 0.05 10*3/uL (ref 0.00–0.07)
Basophils Absolute: 0.1 10*3/uL (ref 0.0–0.1)
Basophils Relative: 1 %
Eosinophils Absolute: 0.2 10*3/uL (ref 0.0–0.5)
Eosinophils Relative: 3 %
HCT: 30.1 % — ABNORMAL LOW (ref 39.0–52.0)
Hemoglobin: 9.2 g/dL — ABNORMAL LOW (ref 13.0–17.0)
Immature Granulocytes: 1 %
Lymphocytes Relative: 25 %
Lymphs Abs: 1.8 10*3/uL (ref 0.7–4.0)
MCH: 23.2 pg — ABNORMAL LOW (ref 26.0–34.0)
MCHC: 30.6 g/dL (ref 30.0–36.0)
MCV: 75.8 fL — ABNORMAL LOW (ref 80.0–100.0)
Monocytes Absolute: 0.6 10*3/uL (ref 0.1–1.0)
Monocytes Relative: 9 %
Neutro Abs: 4.5 10*3/uL (ref 1.7–7.7)
Neutrophils Relative %: 61 %
Platelets: 375 10*3/uL (ref 150–400)
RBC: 3.97 MIL/uL — ABNORMAL LOW (ref 4.22–5.81)
RDW: 18.3 % — ABNORMAL HIGH (ref 11.5–15.5)
WBC: 7.2 10*3/uL (ref 4.0–10.5)
nRBC: 0 % (ref 0.0–0.2)

## 2022-08-23 LAB — COMPREHENSIVE METABOLIC PANEL
ALT: 9 U/L (ref 0–44)
AST: 11 U/L — ABNORMAL LOW (ref 15–41)
Albumin: <1.5 g/dL — ABNORMAL LOW (ref 3.5–5.0)
Alkaline Phosphatase: 116 U/L (ref 38–126)
Anion gap: 7 (ref 5–15)
BUN: 6 mg/dL (ref 6–20)
CO2: 26 mmol/L (ref 22–32)
Calcium: 7.7 mg/dL — ABNORMAL LOW (ref 8.9–10.3)
Chloride: 101 mmol/L (ref 98–111)
Creatinine, Ser: 1.03 mg/dL (ref 0.61–1.24)
GFR, Estimated: >60 mL/min (ref >60)
Glucose, Bld: 181 mg/dL — ABNORMAL HIGH (ref 70–99)
Potassium: 3.7 mmol/L (ref 3.5–5.1)
Sodium: 134 mmol/L — ABNORMAL LOW (ref 135–145)
Total Bilirubin: 0.4 mg/dL (ref 0.3–1.2)
Total Protein: 5.9 g/dL — ABNORMAL LOW (ref 6.5–8.1)

## 2022-08-23 LAB — GLUCOSE, CAPILLARY
Glucose-Capillary: 159 mg/dL — ABNORMAL HIGH (ref 70–99)
Glucose-Capillary: 150 mg/dL — ABNORMAL HIGH (ref 70–99)
Glucose-Capillary: 96 mg/dL (ref 70–99)
Glucose-Capillary: 139 mg/dL — ABNORMAL HIGH (ref 70–99)

## 2022-08-23 MED ORDER — MELATONIN 5 MG PO TABS
5.0000 mg | ORAL_TABLET | Freq: Every day | ORAL | Status: DC
  Administered 2022-08-23 – 2022-09-09 (×18): 5 mg via ORAL
  Filled 2022-08-23 (×18): qty 1

## 2022-08-23 MED ORDER — GABAPENTIN 100 MG PO CAPS
100.0000 mg | ORAL_CAPSULE | Freq: Three times a day (TID) | ORAL | Status: DC
  Administered 2022-08-23 – 2022-08-30 (×21): 100 mg via ORAL
  Filled 2022-08-23 (×21): qty 1

## 2022-08-23 NOTE — Evaluation (Signed)
Physical Therapy Assessment and Plan  Patient Details  Name: Alan Tapia. MRN: [id number] Date of Birth: 03-May-1976  PT Diagnosis: Abnormal posture, Difficulty walking, Edema, Impaired sensation, and Muscle weakness Rehab Potential: Good ELOS: 3 weeks   Today's Date: 08/23/2022 PT Individual Time: 1417-1530 PT Individual Time Calculation (min): 73 min    Hospital Problem: Principal Problem:   Below-knee amputation of left lower extremity   Past Medical History:  Past Medical History:  Diagnosis Date   Hypertension    Uncontrolled type 2 diabetes mellitus with hyperglycemia, with long-term current use of insulin 06/06/2022   Past Surgical History:  Past Surgical History:  Procedure Laterality Date   AMPUTATION Left 08/15/2022   Procedure: LEFT BELOW KNEE AMPUTATION;  Surgeon: Nadara Mustard, MD;  [facility];  Service: Orthopedics;  Laterality: Left;    Assessment & Plan Clinical Impression: Alan Tapia is a 47 year old male with history of HTN, T2DM, morbid obesity- BMI, left foot wound X 2 years w/p ray amp and last debrided earlier this year with recommendations of IV Vanc/Zosyn but PICC fell out and he had to come to [place] due to family emergency and had missed antibiotics X 4 weeks.  He was admitted on 08/13/22 with increase swelling, increase in drainage and MRI of foot done showing large plantar wound with fluid collection/abscess or coalescent edema, osteomyelitis of 5th proximal phalanx and entire 5 MT, septic arthritis v/s osteo 4th MTP joint and question of osteo of cuboid and at post amp site.  He was started on IV antibiotics for acute on chronic osteomyelitis and underwent L-BKA on 08/15/22 by Dr. Lajoyce Corners.    2D echo done due to chronic BLE lymphedema as well as reports of SOB and intermittent CP.  This revealed EF 35-40% with global hypokinesis with mild dilation of LV/LA and dilated inferior vena cava.he was started on GDMT due to suspicion of CHF and Dr.  Shari Prows was consulted for input. She recommended multiple medication changes and recommended not adding SGLT2i until DM better controlled. He is to follow up in office for repeat echo in 4-6 weeks and if EF remains depressed; will need ischemia work up.  RD and HF pharmacy have been following for education/input.  Meal coverage added for better BS control per diabetes coordinator's input. Patient was independent PTA. PT/OT has been working with patient who continues to require mod A+2 to stand for 30 seconds, has LOB posteriorly and attempting pivot transfers. CIR recommended due to functional decline.   Patient currently requires mod with mobility secondary to muscle weakness and decreased standing balance, decreased postural control, and difficulty maintaining precautions.  Prior to hospitalization, patient was modified independent  with mobility and lived with Spouse in a Other(Comment) (2 story town home.) home.  Home access is 2Stairs to enter.  Patient will benefit from skilled PT intervention to maximize safe functional mobility, minimize fall risk, and decrease caregiver burden for planned discharge home with 24 hour supervision.  Anticipate patient will benefit from follow up HH at discharge.  PT - End of Session Activity Tolerance: Tolerates 10 - 20 min activity with multiple rests Endurance Deficit: Yes PT Assessment Rehab Potential (ACUTE/IP ONLY): Good PT Barriers to Discharge: Inaccessible home environment;Home environment access/layout;Weight bearing restrictions PT Patient demonstrates impairments in the following area(s): Balance;Motor;Safety;Sensory;Edema;Endurance PT Transfers Functional Problem(s): Bed Mobility;Bed to Chair;Car;Furniture PT Locomotion Functional Problem(s): Wheelchair Mobility;Ambulation;Stairs PT Plan PT Intensity: Minimum of 1-2 x/day ,45 to 90 minutes PT Frequency: 5 out  of 7 days PT Duration Estimated Length of Stay: 3 weeks PT Treatment/Interventions:  Ambulation/gait training;Discharge planning;DME/adaptive equipment instruction;Functional mobility training;Therapeutic Activities;UE/LE Strength taining/ROM;Community reintegration;Patient/family Counselling psychologist;Therapeutic Exercise;UE/LE Coordination activities;Wheelchair propulsion/positioning PT Transfers Anticipated Outcome(s): supervision PT Locomotion Anticipated Outcome(s): w/c supervision, gait TBD PT Recommendation Recommendations for Other Services: Neuropsych consult Follow Up Recommendations: Home health PT Patient destination: Home Equipment Recommended: To be determined   PT Evaluation Precautions/Restrictions Precautions Precautions: Fall;Other (comment) (wound vac) Required Braces or Orthoses: Other Brace Other Brace: L limb guard Restrictions Weight Bearing Restrictions: Yes LLE Weight Bearing: Non weight bearing General Chart Reviewed: Yes Family/Caregiver Present: Yes Vital SignsTherapy Vitals Temp: 98.1 F (36.7 C) Temp Source: Oral Pulse Rate: 84 Resp: 18 BP: (!) 169/96 Patient Position (if appropriate): Sitting Oxygen Therapy SpO2: 100 % O2 Device: Room Air Pain Pain Assessment Pain Scale: 0-10 Pain Score: 0-No pain Pain Interference Pain Interference Pain Effect on Sleep: 2. Occasionally Pain Interference with Therapy Activities: 1. Rarely or not at all Pain Interference with Day-to-Day Activities: 1. Rarely or not at all Home Living/Prior Functioning Home Living Available Help at Discharge: Family;Available 24 hours/day Type of Home: Other(Comment) (2 story town home.) Home Access: Stairs to enter Secretary/administrator of Steps: 2 Entrance Stairs-Rails: Right;Left (cannot reach both simultaneously.) Home Layout: Two level;1/2 bath on main level Alternate Level Stairs-Number of Steps: 13 Alternate Level Stairs-Rails: Left Bathroom Shower/Tub: Other (comment) (Spongebathing at baseline.) Bathroom Toilet: Standard Bathroom  Accessibility: Yes Additional Comments: Pt reports significant other was assisting with threading of LB garments and furniture transfers. Sponge-bathing prior to admission and using RW for ambulation.  Lives With: Spouse Prior Function Level of Independence: Independent with gait;Independent with transfers (sofa at home lower surface.)  Able to Take Stairs?: Yes (until 2 weeks ago) Driving: Yes Vision/Perception  Vision - History Ability to See in Adequate Light: 0 Adequate Perception Perception: Within Functional Limits Praxis Praxis: Intact  Cognition Overall Cognitive Status: Within Functional Limits for tasks assessed Arousal/Alertness: Awake/alert Memory: Appears intact Awareness: Appears intact Problem Solving: Appears intact Safety/Judgment: Impaired Sensation Sensation Light Touch: Impaired by gross assessment Hot/Cold: Appears Intact Additional Comments: R foot. Coordination Gross Motor Movements are Fluid and Coordinated: No Fine Motor Movements are Fluid and Coordinated: Yes Coordination and Movement Description: Deficits secondary to L BKA and bariatric status. Motor  Motor Motor: Within Functional Limits Motor - Skilled Clinical Observations: L BKA   Trunk/Postural Assessment  Cervical Assessment Cervical Assessment: Within Functional Limits Thoracic Assessment Thoracic Assessment: Within Functional Limits Lumbar Assessment Lumbar Assessment: Exceptions to [facility] (posterior pelvic tilt in w/c.) Postural Control Postural Control: Within Functional Limits  Balance Balance Balance Assessed: Yes Static Sitting Balance Static Sitting - Balance Support: Feet supported Static Sitting - Level of Assistance: 5: Stand by assistance Static Standing Balance Static Standing - Balance Support: Bilateral upper extremity supported;During functional activity Static Standing - Level of Assistance: 5: Stand by assistance (CGA) Extremity Assessment  RUE Assessment RUE  Assessment: Within Functional Limits LUE Assessment LUE Assessment: Within Functional Limits RLE Assessment RLE Assessment: Exceptions to [facility] General Strength Comments: grossly 4/5, edema. LLE Assessment LLE Assessment: Exceptions to [facility] General Strength Comments: hip and knee grossly 3+/5  Care Tool Care Tool Bed Mobility Roll left and right activity   Roll left and right assist level: Supervision/Verbal cueing    Sit to lying activity   Sit to lying assist level: Supervision/Verbal cueing    Lying to sitting on side of bed activity   Lying to sitting on  side of bed assist level: the ability to move from lying on the back to sitting on the side of the bed with no back support.: Supervision/Verbal cueing     Care Tool Transfers Sit to stand transfer Sit to stand activity did not occur: Safety/medical concerns      Chair/bed transfer   Chair/bed transfer assist level: Moderate Assistance - Patient 50 - 74%     Toilet transfer   Assist Level: Minimal Assistance - Patient > 75% (Lateral scoot)    Car transfer Car transfer activity did not occur: Safety/medical concerns        Care Tool Locomotion Ambulation Ambulation activity did not occur: Safety/medical concerns        Walk 10 feet activity Walk 10 feet activity did not occur: Safety/medical concerns       Walk 50 feet with 2 turns activity Walk 50 feet with 2 turns activity did not occur: Safety/medical concerns      Walk 150 feet activity Walk 150 feet activity did not occur: Safety/medical concerns      Walk 10 feet on uneven surfaces activity Walk 10 feet on uneven surfaces activity did not occur: Safety/medical concerns      Stairs Stair activity did not occur: Safety/medical concerns        Walk up/down 1 step activity Walk up/down 1 step or curb (drop down) activity did not occur: Safety/medical concerns      Walk up/down 4 steps activity Walk up/down 4 steps activity did not occur: Safety/medical  concerns      Walk up/down 12 steps activity Walk up/down 12 steps activity did not occur: Safety/medical concerns      Pick up small objects from floor Pick up small object from the floor (from standing position) activity did not occur: Safety/medical concerns      Wheelchair Is the patient using a wheelchair?: Yes Type of Wheelchair: Manual   Wheelchair assist level: Supervision/Verbal cueing Max wheelchair distance: 100  Wheel 50 feet with 2 turns activity   Assist Level: Supervision/Verbal cueing  Wheel 150 feet activity   Assist Level: Minimal Assistance - Patient > 75%    Refer to Care Plan for Long Term Goals  SHORT TERM GOAL WEEK 1 PT Short Term Goal 1 (Week 1): Pt will transfer sit to stand w/ mod A. PT Short Term Goal 2 (Week 1): Pt will transfer bed<> w/c w/ min A. PT Short Term Goal 3 (Week 1): PT will assess gait.  Recommendations for other services: Neuropsych  Skilled Therapeutic Intervention Evaluation completed (see details above and below) with education on PT POC and goals and individual treatment initiated with focus on  transfers, strengthening, w/c mobility, endurance, progress to gait.  Pt presents sitting in w/c and agreeable to therapy.  Pt requires mod A for lateral scoot transfers w/c <> bed to maintain L residual limb from touching ground (pt is 6'4").  Pt requires verbal cues for R foot placement and forward lean to maintain use of R foot.  Pt scoots w/ minimal clearance.  Bed mobility w/ supervision.  Pt negotiated w/c x 100' using B UES and R LE.  Pt unable to transfer sit to stand.  Discussed car transfer to SUV height car, but unable to attempt.  Pt performed L knee flex/ext and limb protector left on L limb in w/c at conclusion of therapy.  Unable to procure longer limb leg support.  Pt performed w/c push-ups 2 x 10.  Pt remained sitting  in w/c 2/2 comfort vs bed mattress.  All needs in reach, spouse present.  NT notified of transfers to bed w/ lift for  safety   Mobility Bed Mobility Bed Mobility: Rolling Right;Sit to Supine;Supine to Sit Rolling Right: Supervision/verbal cueing Supine to Sit: Supervision/Verbal cueing (bed rail) Sit to Supine: Supervision/Verbal cueing Transfers Transfers: Lateral/Scoot Transfers Sit to Stand: Moderate Assistance - Patient 50-74% Stand to Sit: Moderate Assistance - Patient 50-74% Lateral/Scoot Transfers: Moderate Assistance - Patient 50-74% Transfer (Assistive device): None Locomotion  Gait Ambulation: No Stairs / Additional Locomotion Stairs: No Wheelchair Mobility Wheelchair Mobility: Yes Wheelchair Assistance: Doctor, general practice: Both upper extremities;Right lower extremity Wheelchair Parts Management: Needs assistance Distance: 100   Discharge Criteria: Patient will be discharged from PT if patient refuses treatment 3 consecutive times without medical reason, if treatment goals not met, if there is a change in medical status, if patient makes no progress towards goals or if patient is discharged from hospital.  The above assessment, treatment plan, treatment alternatives and goals were discussed and mutually agreed upon: by patient and by family  Lucio Lenon 08/23/2022, 4:33 PM

## 2022-08-23 NOTE — Evaluation (Signed)
Occupational Therapy Assessment and Plan  Patient Details  Name: Alan Tapia. MRN: [id number] Date of Birth: Jan 22, 1976  OT Diagnosis: acute pain, muscle weakness (generalized), and decreased activity tolerance Rehab Potential: Rehab Potential (ACUTE ONLY): Good ELOS: 10-14 days   Today's Date: 08/23/2022 OT Individual Time: 4098-1191 OT Individual Time Calculation (min): 71 min     Hospital Problem: Principal Problem:   Below-knee amputation of left lower extremity   Past Medical History:  Past Medical History:  Diagnosis Date   Hypertension    Uncontrolled type 2 diabetes mellitus with hyperglycemia, with long-term current use of insulin 06/06/2022   Past Surgical History:  Past Surgical History:  Procedure Laterality Date   AMPUTATION Left 08/15/2022   Procedure: LEFT BELOW KNEE AMPUTATION;  Surgeon: Nadara Mustard, MD;  Location: [facility] OR;  Service: Orthopedics;  Laterality: Left;    Assessment & Plan Clinical Impression: Patient is is a 47 year old male with history of HTN, T2DM, morbid obesity- BMI, left foot wound X 2 years w/p ray amp and last debrided earlier this year with recommendations of IV Vanc/Zosyn but PICC fell out and he had to come to [place] due to family emergency and had missed antibiotics X 4 weeks.  He was admitted on 08/13/22 with increase swelling, increase in drainage and MRI of foot done showing large plantar wound with fluid collection/abscess or coalescent edema, osteomyelitis of 5th proximal phalanx and entire 5 MT, septic arthritis v/s osteo 4th MTP joint and question of osteo of cuboid and at post amp site.  He was started on IV antibiotics for acute on chronic osteomyelitis and underwent L-BKA on 08/15/22 by Dr. Lajoyce Corners.    2D echo done due to chronic BLE lymphedema as well as reports of SOB and intermittent CP.  This revealed EF 35-40% with global hypokinesis with mild dilation of LV/LA and dilated inferior vena cava.he was started on GDMT due to suspicion  of CHF and Dr. Shari Prows was consulted for input. She recommended multiple medication changes and recommended not adding SGLT2i until DM better controlled. He is to follow up in office for repeat echo in 4-6 weeks and if EF remains depressed; will need ischemia work up.  RD and HF pharmacy have been following for education/input.  Meal coverage added for better BS control per diabetes coordinator's input.  Patient transferred to CIR on 08/22/2022 .    Patient currently requires Setup-Mod A with basic self-care skills secondary to muscle weakness and decreased cardiorespiratoy endurance, and new L BKA. Prior to hospitalization, patient could complete BADLs with Min-Mod A and intermediate use AE, such as a RW.   Patient will benefit from skilled intervention to increase independence with basic self-care skills prior to discharge home with care partner.  Anticipate patient will require intermittent supervision and follow up home health.  OT - End of Session Activity Tolerance: Tolerates 10 - 20 min activity with multiple rests Endurance Deficit: Yes OT Assessment Rehab Potential (ACUTE ONLY): Good OT Barriers to Discharge: Inaccessible home environment;Home environment access/layout;Wound Care;Weight;Weight bearing restrictions OT Patient demonstrates impairments in the following area(s): Balance;Endurance;Pain;Safety OT Basic ADL's Functional Problem(s): Bathing;Dressing;Toileting OT Transfers Functional Problem(s): Toilet;Tub/Shower OT Plan OT Intensity: Minimum of 1-2 x/day, 45 to 90 minutes OT Frequency: 5 out of 7 days OT Duration/Estimated Length of Stay: 10-14 days OT Treatment/Interventions: Metallurgist training;Community reintegration;Discharge planning;Disease mangement/prevention;DME/adaptive equipment instruction;Functional mobility training;Neuromuscular re-education;Pain management;Self Care/advanced ADL retraining;Skin care/wound managment;Patient/family education;Therapeutic  Activities;Therapeutic Exercise;UE/LE Strength taining/ROM;Wheelchair propulsion/positioning OT Basic Self-Care Anticipated Outcome(s): Mod  I at [facility] level OT Toileting Anticipated Outcome(s): Mod I at [facility] level OT Bathroom Transfers Anticipated Outcome(s): Mod I at [facility] level OT Recommendation Patient destination: Home Follow Up Recommendations: Home health OT Equipment Recommended: To be determined   OT Evaluation Precautions/Restrictions  Precautions Precautions: Fall Required Braces or Orthoses: Other Brace Other Brace: L limb guard Restrictions Weight Bearing Restrictions: Yes LLE Weight Bearing: Non weight bearing General Chart Reviewed: Yes Family/Caregiver Present: Yes (Significant other) Home Living/Prior Functioning Home Living Family/patient expects to be discharged to:: Private residence Living Arrangements: Spouse/significant other, Children Available Help at Discharge: Family, Available 24 hours/day Type of Home: Other(Comment) (Townhouse) Home Access: Stairs to enter [employer] of Steps: 2 Entrance Stairs-Rails: Right, Left (Unable to reach both sides) Home Layout: Two level, 1/2 bath on main level Alternate Level Stairs-Number of Steps: 13 Bathroom Shower/Tub: Other (comment) (Spongebathing at baseline.) Bathroom Toilet: Standard Bathroom Accessibility: Yes Additional Comments: Pt reports significant other was assisting with threading of LB garments and furniture transfers. Sponge-bathing prior to admission and using RW for ambulation.  Lives With: Spouse IADL History Homemaking Responsibilities: No Prior Function Level of Independence: Needs assistance with tranfers, Needs assistance with ADLs, Needs assistance with gait Vision Baseline Vision/History: 1 Wears glasses Ability to See in Adequate Light: 0 Adequate Patient Visual Report: No change from baseline Vision Assessment?: No apparent visual deficits Perception  Perception: Within  Functional Limits Praxis Praxis: Intact Cognition Cognition Overall Cognitive Status: Within Functional Limits for tasks assessed Arousal/Alertness: Awake/alert Orientation Level: Person;Place;Situation Memory: Appears intact Awareness: Appears intact Problem Solving: Appears intact Safety/Judgment: Impaired Brief Interview for Mental Status (BIMS) Repetition of Three Words (First Attempt): 3 Temporal Orientation: Year: Correct Temporal Orientation: Month: Accurate within 5 days Temporal Orientation: Day: Correct Recall: "Sock": Yes, no cue required Recall: "Blue": Yes, no cue required Recall: "Bed": No, could not recall BIMS Summary Score: 13 Sensation Sensation Light Touch: Appears Intact Hot/Cold: Appears Intact Coordination Gross Motor Movements are Fluid and Coordinated: No Fine Motor Movements are Fluid and Coordinated: Yes Coordination and Movement Description: Deficits secondary to L BKA and bariatric status. Motor  Motor Motor - Skilled Clinical Observations: L BKA  Trunk/Postural Assessment  Cervical Assessment Cervical Assessment: Within Functional Limits Thoracic Assessment Thoracic Assessment: Within Functional Limits Lumbar Assessment Lumbar Assessment: Within Functional Limits Postural Control Postural Control: Within Functional Limits  Balance Balance Balance Assessed: Yes Static Sitting Balance Static Sitting - Balance Support: Feet supported Static Sitting - Level of Assistance: 5: Stand by assistance Static Standing Balance Static Standing - Balance Support: Bilateral upper extremity supported;During functional activity Static Standing - Level of Assistance: 5: Stand by assistance (CGA) Extremity/Trunk Assessment RUE Assessment RUE Assessment: Within Functional Limits LUE Assessment LUE Assessment: Within Functional Limits  Care Tool Care Tool Self Care Eating   Eating Assist Level: Independent    Oral Care    Oral Care Assist Level:  Independent with assistive device    Bathing   Body parts bathed by patient: Right arm;Left arm;Chest;Abdomen;Front perineal area;Face;Right upper leg;Left upper leg Body parts bathed by helper: Buttocks;Right lower leg Body parts n/a: Left lower leg Assist Level: Minimal Assistance - Patient > 75%    Upper Body Dressing(including orthotics)   What is the patient wearing?: Pull over shirt   Assist Level: Set up assist    Lower Body Dressing (excluding footwear)   What is the patient wearing?: Pants Assist for lower body dressing: Moderate Assistance - Patient 50 - 74%    Putting on/Taking  off footwear   What is the patient wearing?: Non-skid slipper socks Assist for footwear: Dependent - Patient 0%       Care Tool Toileting Toileting activity Toileting Activity did not occur (Clothing management and hygiene only): N/A (no void or bm)       Care Tool Bed Mobility Roll left and right activity    Defer to PT Eval    Sit to lying activity  Defer to PT Eval      Lying to sitting on side of bed activity  Defer to PT Eval       Care Tool Transfers Sit to stand transfer    Defer to PT Eval    Chair/bed transfer    Defer to PT Eval     Toilet transfer   Assist Level: Minimal Assistance - Patient > 75% (Lateral scoot)     Care Tool Cognition  Expression of Ideas and Wants Expression of Ideas and Wants: 4. Without difficulty (complex and basic) - expresses complex messages without difficulty and with speech that is clear and easy to understand  Understanding Verbal and Non-Verbal Content Understanding Verbal and Non-Verbal Content: 4. Understands (complex and basic) - clear comprehension without cues or repetitions   Memory/Recall Ability Memory/Recall Ability : Current season;That he or she is in a hospital/hospital unit   Refer to Care Plan for Long Term Goals  SHORT TERM GOAL WEEK 1 OT Short Term Goal 1 (Week 1): Pt will complete LB dressing with Min A + LRAD. OT  Short Term Goal 2 (Week 1): Pt will complete LB bathing with Min A + LRAD. OT Short Term Goal 3 (Week 1): Pt will complete toilet transfer with CGA + LRAD.  Recommendations for other services: None    Skilled Therapeutic Intervention Pt received resting in bed for skilled OT session with focus on comprehensive evaluation and functional transfers. Pt agreeable to interventions, demonstrating overall pleasant mood. Pt with no reports of pain at rest, stating "I get the phantom pain now and then. . .." OT offering intermediate rest breaks and positioning suggestions throughout session to address potential pain/fatigue and maximize participation/safety in session.   Session began with introduction to OT role, OT POC, and general orientation to rehab unit/schedule. Pt declines ADL participation, sharing his wife assisted with LB dressing. Extensive time spent locating DME to meet patient's height/weight needs. Pt performs x2 STS transfers from elevated bed, requiring Mod A with + 2 for safety and to stabilizing RW. Pt then performs lateral scoot from EOB>WC, requiring cuing for pacing and safety in regard to L-residual limb since patient props it on bed to assist himself by WB through thigh (using L hip extension).   Pt remained sitting in [facility] with all immediate needs met at end of session. Pt continues to be appropriate for skilled OT intervention to promote further functional independence.   ADL ADL Eating: Independent Where Assessed-Eating: Edge of bed Grooming: Modified independent Where Assessed-Grooming: Sitting at sink Upper Body Bathing: Setup Where Assessed-Upper Body Bathing: Edge of bed Lower Body Bathing: Minimal assistance Where Assessed-Lower Body Bathing: Edge of bed Upper Body Dressing: Setup Where Assessed-Upper Body Dressing: Edge of bed Lower Body Dressing: Moderate assistance Where Assessed-Lower Body Dressing: Edge of bed Toileting: Moderate assistance Where  Assessed-Toileting: Bedside Commode Toilet Transfer: Minimal assistance Toilet Transfer Method: Other (comment) (Lateral scoot) Toilet Transfer Equipment: Extra wide drop arm bedside commode Walk-In Shower Transfer: Unable to assess Mobility  Bed Mobility Bed Mobility: Supine to Sit  Supine to Sit: Supervision/Verbal cueing;Other (comment) (HOB elevated) Transfers Sit to Stand: Moderate Assistance - Patient 50-74% Stand to Sit: Moderate Assistance - Patient 50-74%   Discharge Criteria: Patient will be discharged from OT if patient refuses treatment 3 consecutive times without medical reason, if treatment goals not met, if there is a change in medical status, if patient makes no progress towards goals or if patient is discharged from hospital.  The above assessment, treatment plan, treatment alternatives and goals were discussed and mutually agreed upon: by patient and by family  Lou Cal, OTR/L, MSOT  08/23/2022, 3:10 PM

## 2022-08-23 NOTE — Progress Notes (Signed)
Patient refused to take the scheduled ENTRESTO last night.  Per patient the Doctor was supposed to change the order for an other medication given that [facility] had bad side effects. For that reason he did not want to take it until he has a conversation with the attending physician in the morning.

## 2022-08-23 NOTE — Progress Notes (Signed)
Occupational Therapy Session Note  Patient Details  Name: Alan Tapia. MRN: [id number] Date of Birth: 1975-07-20  Today's Date: 08/23/2022 OT Individual Time: 1115-1200 OT Individual Time Calculation (min): 45 min    Short Term Goals: Week 1:     Skilled Therapeutic Interventions/Progress Updates:    Pt seen seated in w/c. Focus of session on toileting, squat pivot transfers, and w/c fit. Obtained for patient better bariatric DAC that is higher so residual limb not touching floor, 22x20 reclining back w/c and Drop arm recliner. Practiced squat pivot transfers to all surfaces except recliner with overall MIN A +2 to steady equipment. Pt demo poor head hips relationship often with poor control needing to lean back onto bed to adjust. On laste scoot pt reliant on hip EXTENSION to scoot into w/c. Trialed sit to stand from elevated EOB but pt kept moving foot out from under BOS causing posterior bias. Demoed and adjusted but unable to achieve functional stand. Exited session with pt seated in w/c, wife in room supervising and call light in reach   Therapy Documentation Precautions:  Restrictions Weight Bearing Restrictions: Yes LLE Weight Bearing: Non weight bearing General:    Therapy/Group: Individual Therapy  Shon Hale 08/23/2022, 7:29 AM

## 2022-08-23 NOTE — Progress Notes (Addendum)
PROGRESS NOTE   Subjective/Complaints:  Pt doing well today, states pain meds help but having some phantom pain which is causing him to have difficulty sleeping. Slept short spurts throughout the night.  LBM yesterday. Urinating fine.  Refused Entresto last night, but discussed that it was Coreg that was changed, pt aware.  Denies any other complaints or concerns today.   ROS: +insomnia, +phantom pain. Denies fevers, chills, CP, SOB, abd pain, N/V/D/C, new/worsening paresthesias/weakness, or any other complaints at this time.    Objective:   No results found. Recent Labs    08/23/22 0818  WBC 7.2  HGB 9.2*  HCT 30.1*  PLT 375   Recent Labs    08/22/22 0236 08/23/22 0818  NA 137 134*  K 3.9 3.7  CL 106 101  CO2 24 26  GLUCOSE 174* 181*  BUN 6 6  CREATININE 0.94 1.03  CALCIUM 7.8* 7.7*    Intake/Output Summary (Last 24 hours) at 08/23/2022 1621 Last data filed at 08/23/2022 1401 Gross per 24 hour  Intake 1091 ml  Output 2000 ml  Net -909 ml        Physical Exam: Vital Signs Blood pressure (!) 169/96, pulse 84, temperature 98.1 F (36.7 C), temperature source Oral, resp. rate 18, height  (1.93 m), weight (!) 159.5 kg, SpO2 100 %.  Constitutional: No distress . Vital signs reviewed. Sitting up in bed.  HEENT: NCAT, EOMI, oral membranes moist Neck: supple Cardiovascular: RRR without murmur. No JVD    Respiratory/Chest: CTA Bilaterally without wheezes or rales. Normal effort    GI/Abdomen: BS +, non-tender, non-distended Ext: no clubbing, cyanosis, or edema Psych: pleasant and cooperative Skin: dry, warm. Chronic changes distal RLE d/t lymphedema. No sign of breakdown. Left BKA with vac in place, small amount serosanguinous blood in cannister.  MSK: Chronic lymphedema RLE. LLE in Vac/dressing with shrinker over top   PRIOR EXAMS:  Neurologic: Alert and oriented x 3. Normal insight and awareness.  Intact Memory. Normal language and speech. Cranial nerve exam unremarkable. MMT: UE 5/5 prox to distal. RLE 4/5 prox to distal. LLE: 2/5 HF. Sensory exam appears grossly normal for light touch and pain in all 4 limbs. No limb ataxia or cerebellar signs. No abnormal tone appreciated.     Assessment/Plan: 1. Functional deficits which require 3+ hours per day of interdisciplinary therapy in a comprehensive inpatient rehab setting. Physiatrist is providing close team supervision and 24 hour management of active medical problems listed below. Physiatrist and rehab team continue to assess barriers to discharge/monitor patient progress toward functional and medical goals  Care Tool:  Bathing    Body parts bathed by patient: Right arm, Left arm, Chest, Abdomen, Front perineal area, Face, Right upper leg, Left upper leg   Body parts bathed by helper: Buttocks, Right lower leg Body parts n/a: Left lower leg   Bathing assist Assist Level: Minimal Assistance - Patient > 75%     Upper Body Dressing/Undressing Upper body dressing   What is the patient wearing?: Pull over shirt    Upper body assist Assist Level: Set up assist    Lower Body Dressing/Undressing Lower body dressing  What is the patient wearing?: Pants     Lower body assist Assist for lower body dressing: Moderate Assistance - Patient 50 - 74%     Toileting Toileting Toileting Activity did not occur (Clothing management and hygiene only): N/A (no void or bm)  Toileting assist Assist for toileting: Dependent - Patient 0%     Transfers Chair/bed transfer  Transfers assist     Chair/bed transfer assist level: Moderate Assistance - Patient 50 - 74%     Locomotion Ambulation   Ambulation assist   Ambulation activity did not occur: Safety/medical concerns          Walk 10 feet activity   Assist  Walk 10 feet activity did not occur: Safety/medical concerns        Walk 50 feet activity   Assist  Walk 50 feet with 2 turns activity did not occur: Safety/medical concerns         Walk 150 feet activity   Assist Walk 150 feet activity did not occur: Safety/medical concerns         Walk 10 feet on uneven surface  activity   Assist Walk 10 feet on uneven surfaces activity did not occur: Safety/medical concerns         Wheelchair     Assist Is the patient using a wheelchair?: Yes Type of Wheelchair: Manual    Wheelchair assist level: Supervision/Verbal cueing Max wheelchair distance: 100    Wheelchair 50 feet with 2 turns activity    Assist        Assist Level: Supervision/Verbal cueing   Wheelchair 150 feet activity     Assist      Assist Level: Minimal Assistance - Patient > 75%   Blood pressure (!) 169/96, pulse 84, temperature 98.1 F (36.7 C), temperature source Oral, resp. rate 18, height 6\' 4"  (1.93 m), weight (!) 159.5 kg, SpO2 100 %.  Medical Problem List and Plan: 1. 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             -patient may shower             -ELOS/Goals: 7-10 days, supervision to min assist, +/- at w/c level 2.  Antithrombotics: -DVT/anticoagulation:  Pharmaceutical: Lovenox 80mg  QD             -antiplatelet therapy: N/A 3. Pain Management: Tylenol 650mg  TID, Oxycodone 5-10mg  q4h prn. Tramadol 50mg  q6h PRN.              -add robaxin 500mg  q6h prn for spasms.  -08/23/22 having phantom pain interfering with sleep, will add gabapentin 100mg  TID for now, monitor for effect/side effects.  4. Mood/Behavior/Sleep: LCSW to follow for evaluation and support.              -antipsychotic agents: N/A -08/23/22 poor sleep, ordered melatonin 5mg  QHS, also has trazodone PRN 5. Neuropsych/cognition: This patient is capable of making decisions on his own behalf. 6. Skin/Wound Care: Monitor wound for healing             --limb protector when up -he's post-op day #7. Spoke with Dr Lajoyce Corners  who recommended changing vac to prevena and continuing for another week.  7. Fluids/Electrolytes/Nutrition: Monitor I/O. Continue Vitamin C and Zinc and MVI. Monitor routine Monday labs.              --add juven BID additionally -08/23/22 BMP with mild hyponatremia 134, otherwise fairly unremarkable, monitor on routine labs 8.  T2DM: Hgb A1c- 10.3 and poorly controlled. --dietician has added double portion as well as snacks TID to help with hunger/apptite             --monitor BS ac/hs and titrate insulin as indicated             --continue Insulin glargine 14 units QD and SSI  -08/23/22 CBGs fair, cont regimen CBG (last 3)  Recent Labs    08/22/22 2137 08/23/22 0601 08/23/22 1213  GLUCAP 193* 159* 150*    9. Dilated CM of unclear etiology: On Lasix  QD, Entresto 49-51mg  BID (dose increased today) and spironolactone  QD             --will add low salt restrictions. Monitor daily weights --add low dose KCL due to recurrent hypokalemia requiring intermittent supplementation --recheck BMET 04/13 and on 04/15 as on spiro -pt is having nausea and ?diarrhea with coreg increase. Will convert him to metoprolol  bid to start--->titrate as needed. -08/23/22 K+ 3.7, continue supplementation; pt educated on med changes and importance of compliance. No weight done today, ordered daily weights.  Filed Weights   08/22/22 1531  Weight: (!) 159.5 kg    10. HTN: Monitor BP TID--now on Lasix, Entresto and Spironolactone.             -metoprolol as above  -08/23/22 BPs elevated but recent change to meds, monitor for now Vitals:   08/22/22 2008 08/23/22 0502 08/23/22 0949 08/23/22 0949  BP: (!) 156/92 (!) 168/87 (!) 168/91 (!) 168/91   08/23/22 1401  BP: (!) 169/96    11. Morbid obesity: Continue to educate on diet, exercise and compliance to help promote overall health and mobility.    12. Anemia:   -08/23/22 HGb 9.2, relatively stable; monitor on routine labs  I spent >91mins on patient  care d/t discussions with pt regarding medications and adjustments, writing orders and notes, and coordination of care.   LOS: 1 days A FACE TO FACE EVALUATION WAS PERFORMED  [address] 08/23/2022, 4:21 PM

## 2022-08-23 NOTE — Plan of Care (Signed)
  Problem: Consults Goal: RH LIMB LOSS PATIENT EDUCATION Description: Description: See Patient Education module for eduction specifics. Outcome: Progressing   Problem: RH SKIN INTEGRITY Goal: RH STG SKIN FREE OF INFECTION/BREAKDOWN Description: Manage w min assist Outcome: Progressing Goal: RH STG ABLE TO PERFORM INCISION/WOUND CARE W/ASSISTANCE Description: STG Able To Perform Incision/Wound Care With Assistance. Outcome: Progressing   Problem: RH SAFETY Goal: RH STG ADHERE TO SAFETY PRECAUTIONS W/ASSISTANCE/DEVICE Description: STG Adhere to Safety Precautions With cues Assistance/Device. Outcome: Progressing   Problem: RH PAIN MANAGEMENT Goal: RH STG PAIN MANAGED AT OR BELOW PT'S PAIN GOAL Description: < 4 with prns Outcome: Progressing   Problem: RH KNOWLEDGE DEFICIT LIMB LOSS Goal: RH STG INCREASE KNOWLEDGE OF SELF CARE AFTER LIMB LOSS Description: Patient and spouse will be able to manage care at discharge using educational resources independently Outcome: Progressing   Problem: Education: Goal: Knowledge of the prescribed therapeutic regimen will improve Outcome: Progressing Goal: Ability to verbalize activity precautions or restrictions will improve Outcome: Progressing Goal: Understanding of discharge needs will improve Outcome: Progressing   Problem: Activity: Goal: Ability to perform//tolerate increased activity and mobilize with assistive devices will improve Outcome: Progressing   Problem: Clinical Measurements: Goal: Postoperative complications will be avoided or minimized Outcome: Progressing   Problem: Self-Care: Goal: Ability to meet self-care needs will improve Outcome: Progressing   Problem: Self-Concept: Goal: Ability to maintain and perform role responsibilities to the fullest extent possible will improve Outcome: Progressing   Problem: Pain Management: Goal: Pain level will decrease with appropriate interventions Outcome: Progressing    Problem: Skin Integrity: Goal: Demonstration of wound healing without infection will improve Outcome: Progressing

## 2022-08-23 NOTE — Plan of Care (Signed)
  Problem: RH Balance Goal: LTG: Patient will maintain dynamic sitting balance (OT) Description: LTG:  Patient will maintain dynamic sitting balance with assistance during activities of daily living (OT) Flowsheets (Taken 08/23/2022 1528) LTG: Pt will maintain dynamic sitting balance during ADLs with: Independent with assistive device   Problem: Sit to Stand Goal: LTG:  Patient will perform sit to stand in prep for activites of daily living with assistance level (OT) Description: LTG:  Patient will perform sit to stand in prep for activites of daily living with assistance level (OT) Flowsheets (Taken 08/23/2022 1528) LTG: PT will perform sit to stand in prep for activites of daily living with assistance level: Independent with assistive device   Problem: RH Bathing Goal: LTG Patient will bathe all body parts with assist levels (OT) Description: LTG: Patient will bathe all body parts with assist levels (OT) Flowsheets (Taken 08/23/2022 1528) LTG: Pt will perform bathing with assistance level/cueing: Independent with assistive device    Problem: RH Dressing Goal: LTG Patient will perform lower body dressing w/assist (OT) Description: LTG: Patient will perform lower body dressing with assist, with/without cues in positioning using equipment (OT) Flowsheets (Taken 08/23/2022 1528) LTG: Pt will perform lower body dressing with assistance level of: Independent with assistive device   Problem: RH Toileting Goal: LTG Patient will perform toileting task (3/3 steps) with assistance level (OT) Description: LTG: Patient will perform toileting task (3/3 steps) with assistance level (OT)  Flowsheets (Taken 08/23/2022 1528) LTG: Pt will perform toileting task (3/3 steps) with assistance level: Independent with assistive device   Problem: RH Toilet Transfers Goal: LTG Patient will perform toilet transfers w/assist (OT) Description: LTG: Patient will perform toilet transfers with assist, with/without cues  using equipment (OT) Flowsheets (Taken 08/23/2022 1528) LTG: Pt will perform toilet transfers with assistance level of: Independent with assistive device

## 2022-08-24 DIAGNOSIS — K5901 Slow transit constipation: Secondary | ICD-10-CM

## 2022-08-24 DIAGNOSIS — R739 Hyperglycemia, unspecified: Secondary | ICD-10-CM | POA: Diagnosis not present

## 2022-08-24 DIAGNOSIS — S88112D Complete traumatic amputation at level between knee and ankle, left lower leg, subsequent encounter: Secondary | ICD-10-CM | POA: Diagnosis not present

## 2022-08-24 DIAGNOSIS — I10 Essential (primary) hypertension: Secondary | ICD-10-CM | POA: Diagnosis not present

## 2022-08-24 DIAGNOSIS — G548 Other nerve root and plexus disorders: Secondary | ICD-10-CM | POA: Diagnosis not present

## 2022-08-24 LAB — GLUCOSE, CAPILLARY
Glucose-Capillary: 111 mg/dL — ABNORMAL HIGH (ref 70–99)
Glucose-Capillary: 149 mg/dL — ABNORMAL HIGH (ref 70–99)
Glucose-Capillary: 170 mg/dL — ABNORMAL HIGH (ref 70–99)
Glucose-Capillary: 166 mg/dL — ABNORMAL HIGH (ref 70–99)

## 2022-08-24 MED ORDER — POLYETHYLENE GLYCOL 3350 17 G PO PACK
17.0000 g | PACK | Freq: Every day | ORAL | Status: DC
  Administered 2022-08-24 – 2022-09-10 (×8): 17 g via ORAL
  Filled 2022-08-24 (×17): qty 1

## 2022-08-24 NOTE — Progress Notes (Signed)
Pt was given miralax per orders this afternoon.  I went in and the pt was in the bathroom.  When I came out I ask "if everything came out ok?".   Pt seem to be annoyed and said yes it did.  I gave the remainder of the meds and pt asking for more food to eat.  I told him because of the diet he was on the kitchen will not send him any in between meals snack due to his diet order.  I told him they have to be careful with the carb's and sweets that he was asking for.  Pt stated that he was not a DM. I told him that according to his chart he was considered a TDM2.  He stated that he was a type one diabetic.  I said ok and asked if it was anything else I could bring him and he said  peanut butter and saltine cracker.  I did take him one peanut butter and 2 saltine crackers..  I said that I will be back to check on him a little later.

## 2022-08-24 NOTE — Progress Notes (Signed)
PROGRESS NOTE   Subjective/Complaints:  Pt doing well again today, slept "excellent" last night. Pain overall controlled, no side effects from gabapentin at this time.  LBM 2d ago, doesn't feel constipated but usually goes daily at home. Urinating fine.  Denies any other complaints or concerns today.   ROS: +insomnia-improved, +phantom pain, +constipation. Denies fevers, chills, CP, SOB, abd pain, N/V/D, new/worsening paresthesias/weakness, or any other complaints at this time.    Objective:   No results found. Recent Labs    08/23/22 0818  WBC 7.2  HGB 9.2*  HCT 30.1*  PLT 375   Recent Labs    08/22/22 0236 08/23/22 0818  NA 137 134*  K 3.9 3.7  CL 106 101  CO2 24 26  GLUCOSE 174* 181*  BUN 6 6  CREATININE 0.94 1.03  CALCIUM 7.8* 7.7*    Intake/Output Summary (Last 24 hours) at 08/24/2022 1106 Last data filed at 08/24/2022 0751 Gross per 24 hour  Intake 716 ml  Output 400 ml  Net 316 ml        Physical Exam: Vital Signs Blood pressure (!) 149/92, pulse 82, temperature 98.1 F (36.7 C), temperature source Oral, resp. rate 16, height  (1.93 m), weight (!) 159.5 kg, SpO2 98 %.  Constitutional: No distress . Vital signs reviewed. Laying in bed.  HEENT: NCAT, EOMI, oral membranes moist Neck: supple Cardiovascular: RRR without murmur. No JVD    Respiratory/Chest: CTA Bilaterally without wheezes or rales. Normal effort    GI/Abdomen: BS +, non-tender, non-distended Ext: no clubbing or cyanosis Psych: pleasant and cooperative Skin: dry, warm. Chronic changes distal RLE d/t lymphedema, trace to 1+ edema noted today. No sign of breakdown. Left BKA with vac/dressing.  MSK: Chronic lymphedema RLE. LLE in Vac/dressing with shrinker over top   PRIOR EXAMS:  Neurologic: Alert and oriented x 3. Normal insight and awareness. Intact Memory. Normal language and speech. Cranial nerve exam unremarkable. MMT: UE 5/5  prox to distal. RLE 4/5 prox to distal. LLE: 2/5 HF. Sensory exam appears grossly normal for light touch and pain in all 4 limbs. No limb ataxia or cerebellar signs. No abnormal tone appreciated.     Assessment/Plan: 1. Functional deficits which require 3+ hours per day of interdisciplinary therapy in a comprehensive inpatient rehab setting. Physiatrist is providing close team supervision and 24 hour management of active medical problems listed below. Physiatrist and rehab team continue to assess barriers to discharge/monitor patient progress toward functional and medical goals  Care Tool:  Bathing    Body parts bathed by patient: Right arm, Left arm, Chest, Abdomen, Front perineal area, Face, Right upper leg, Left upper leg   Body parts bathed by helper: Buttocks, Right lower leg Body parts n/a: Left lower leg   Bathing assist Assist Level: Minimal Assistance - Patient > 75%     Upper Body Dressing/Undressing Upper body dressing   What is the patient wearing?: Pull over shirt    Upper body assist Assist Level: Set up assist    Lower Body Dressing/Undressing Lower body dressing      What is the patient wearing?: Pants     Lower body assist Assist for  lower body dressing: Moderate Assistance - Patient 50 - 74%     Toileting Toileting Toileting Activity did not occur (Clothing management and hygiene only): N/A (no void or bm)  Toileting assist Assist for toileting: Dependent - Patient 0%     Transfers Chair/bed transfer  Transfers assist     Chair/bed transfer assist level: Moderate Assistance - Patient 50 - 74%     Locomotion Ambulation   Ambulation assist   Ambulation activity did not occur: Safety/medical concerns          Walk 10 feet activity   Assist  Walk 10 feet activity did not occur: Safety/medical concerns        Walk 50 feet activity   Assist Walk 50 feet with 2 turns activity did not occur: Safety/medical concerns          Walk 150 feet activity   Assist Walk 150 feet activity did not occur: Safety/medical concerns         Walk 10 feet on uneven surface  activity   Assist Walk 10 feet on uneven surfaces activity did not occur: Safety/medical concerns         Wheelchair     Assist Is the patient using a wheelchair?: Yes Type of Wheelchair: Manual    Wheelchair assist level: Supervision/Verbal cueing Max wheelchair distance: 100    Wheelchair 50 feet with 2 turns activity    Assist        Assist Level: Supervision/Verbal cueing   Wheelchair 150 feet activity     Assist      Assist Level: Minimal Assistance - Patient > 75%   Blood pressure (!) 149/92, pulse 82, temperature 98.1 F (36.7 C), temperature source Oral, resp. rate 16, height 6\' 4"  (1.93 m), weight (!) 159.5 kg, SpO2 98 %.  Medical Problem List and Plan: 1. 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  Antithrombotics: -DVT/anticoagulation:  Pharmaceutical: Lovenox 80mg  QD             -antiplatelet therapy: N/A 3. Pain Management: Tylenol 650mg  TID, Oxycodone 5-10mg  q4h prn. Tramadol 50mg  q6h PRN.              -add robaxin 500mg  q6h prn for spasms.  -08/23/22 having phantom pain interfering with sleep, will add gabapentin 100mg  TID for now, monitor for effect/side effects.  4. Mood/Behavior/Sleep: LCSW to follow for evaluation and support.              -antipsychotic agents: N/A -08/23/22 poor sleep, ordered melatonin 5mg  QHS, also has trazodone PRN--improved 08/24/22 5. Neuropsych/cognition: This patient is capable of making decisions on his own behalf. 6. Skin/Wound Care: Monitor wound for healing             --limb protector when up -he's post-op day #7. Spoke with Dr Lajoyce Corners who recommended changing vac to prevena and  continuing for another week.  7. Fluids/Electrolytes/Nutrition: Monitor I/O. Continue Vitamin C and Zinc and MVI. Monitor routine Monday labs.              --add juven BID additionally -08/23/22 BMP with mild hyponatremia 134, otherwise fairly unremarkable, monitor on routine Monday labs 8. T2DM: Hgb A1c- 10.3 and poorly controlled. --dietician has added  double portion as well as snacks TID to help with hunger/apptite             --monitor BS ac/hs and titrate insulin as indicated             --continue Insulin glargine 14 units QD and SSI  -4/13-14/24 CBGs good, cont regimen CBG (last 3)  Recent Labs    08/23/22 1635 08/23/22 2041 08/24/22 0610  GLUCAP 96 139* 111*    9. Dilated CM of unclear etiology: On Lasix  QD, Entresto 49-51mg  BID (dose increased today) and spironolactone  QD             --will add low salt restrictions. Monitor daily weights --add low dose KCL due to recurrent hypokalemia requiring intermittent supplementation --recheck BMET 04/13 and on 04/15 as on spiro -pt is having nausea and ?diarrhea with coreg increase. Will convert him to metoprolol  bid to start--->titrate as needed. -08/23/22 K+ 3.7; pt educated on med changes and importance of compliance. No weight done today, ordered daily weights.  -08/24/22 no wt again, asked nursing to please make sure daily wt done; slight increase in RLE swelling noted, but mild, monitor for now [facility] Weights   08/22/22 1531  Weight: (!) 159.5 kg    10. HTN: Monitor BP TID--now on Lasix, Entresto and Spironolactone.             -metoprolol as above  -08/23/22 BPs elevated but recent change to meds, monitor for now  -08/24/22 BPs downtrending, monitor Vitals:   08/22/22 2008 08/23/22 0502 08/23/22 0949 08/23/22 0949  BP: (!) 156/92 (!) 168/87 (!) 168/91 (!) 168/91   08/23/22 1401 08/23/22 1928 08/24/22 0600  BP: (!) 169/96 (!) 148/80 (!) 149/92    11. Morbid obesity: Continue to educate on diet, exercise and  compliance to help promote overall health and mobility.    12. Anemia:   -08/23/22 HGb 9.2, relatively stable; monitor on routine labs 13. Constipation: usually goes daily at home  -08/24/22 no BM in 2 days, will start miralax 17g QD for now; monitor  LOS: 2 days A FACE TO FACE EVALUATION WAS PERFORMED  [address] 08/24/2022, 11:06 AM

## 2022-08-25 DIAGNOSIS — M79605 Pain in left leg: Secondary | ICD-10-CM | POA: Diagnosis not present

## 2022-08-25 DIAGNOSIS — E1165 Type 2 diabetes mellitus with hyperglycemia: Secondary | ICD-10-CM | POA: Diagnosis not present

## 2022-08-25 DIAGNOSIS — D649 Anemia, unspecified: Secondary | ICD-10-CM

## 2022-08-25 DIAGNOSIS — K5900 Constipation, unspecified: Secondary | ICD-10-CM

## 2022-08-25 DIAGNOSIS — S88112D Complete traumatic amputation at level between knee and ankle, left lower leg, subsequent encounter: Secondary | ICD-10-CM | POA: Diagnosis not present

## 2022-08-25 DIAGNOSIS — Z794 Long term (current) use of insulin: Secondary | ICD-10-CM

## 2022-08-25 DIAGNOSIS — I10 Essential (primary) hypertension: Secondary | ICD-10-CM | POA: Diagnosis not present

## 2022-08-25 LAB — BASIC METABOLIC PANEL
Anion gap: 8 (ref 5–15)
BUN: 8 mg/dL (ref 6–20)
CO2: 25 mmol/L (ref 22–32)
Calcium: 7.8 mg/dL — ABNORMAL LOW (ref 8.9–10.3)
Chloride: 104 mmol/L (ref 98–111)
Creatinine, Ser: 1.11 mg/dL (ref 0.61–1.24)
GFR, Estimated: >60 mL/min (ref >60)
Glucose, Bld: 173 mg/dL — ABNORMAL HIGH (ref 70–99)
Potassium: 3.8 mmol/L (ref 3.5–5.1)
Sodium: 137 mmol/L (ref 135–145)

## 2022-08-25 LAB — CBC
HCT: 31 % — ABNORMAL LOW (ref 39.0–52.0)
Hemoglobin: 9.2 g/dL — ABNORMAL LOW (ref 13.0–17.0)
MCH: 23.1 pg — ABNORMAL LOW (ref 26.0–34.0)
MCHC: 29.7 g/dL — ABNORMAL LOW (ref 30.0–36.0)
MCV: 77.7 fL — ABNORMAL LOW (ref 80.0–100.0)
Platelets: 396 10*3/uL (ref 150–400)
RBC: 3.99 MIL/uL — ABNORMAL LOW (ref 4.22–5.81)
RDW: 19 % — ABNORMAL HIGH (ref 11.5–15.5)
WBC: 7 10*3/uL (ref 4.0–10.5)
nRBC: 0 % (ref 0.0–0.2)

## 2022-08-25 LAB — GLUCOSE, CAPILLARY
Glucose-Capillary: 151 mg/dL — ABNORMAL HIGH (ref 70–99)
Glucose-Capillary: 143 mg/dL — ABNORMAL HIGH (ref 70–99)
Glucose-Capillary: 133 mg/dL — ABNORMAL HIGH (ref 70–99)
Glucose-Capillary: 190 mg/dL — ABNORMAL HIGH (ref 70–99)

## 2022-08-25 MED ORDER — ACETAMINOPHEN 325 MG PO TABS: 650.0000 mg | ORAL_TABLET | Freq: Three times a day (TID) | ORAL | Status: DC

## 2022-08-25 MED ORDER — HYDROCERIN EX CREA
TOPICAL_CREAM | Freq: Two times a day (BID) | CUTANEOUS | Status: DC
  Administered 2022-09-08: 1 via TOPICAL
  Filled 2022-08-25: qty 113

## 2022-08-25 NOTE — Progress Notes (Signed)
Inpatient Rehabilitation  Patient information reviewed and entered into eRehab system by Shelda Truby M. Kylani Wires, M.A., CCC/SLP, PPS Coordinator.  Information including medical coding, functional ability and quality indicators will be reviewed and updated through discharge.    

## 2022-08-25 NOTE — Discharge Instructions (Addendum)
Inpatient Rehab Discharge Instructions  [name]. Discharge date and time:  09/10/22  Activities/Precautions/ Functional Status: Activity: no lifting, driving, or strenuous exercise till cleared by MD.  Diet: diabetic diet Wound Care: keep wound clean and dry. Contact Dr. Lajoyce Corners if you develop any problems with your incision/wound--redness, swelling, increase in pain, drainage or if you develop fever or chills.    Functional status:  ___ No restrictions     ___ Walk up steps independently ___ 24/7 supervision/assistance   ___ Walk up steps with assistance ___ Intermittent supervision/assistance  ___ Bathe/dress independently ___ Walk with walker     ___ Bathe/dress with assistance ___ Walk Independently    ___ Shower independently ___ Walk with assistance    ___ Shower with assistance ___ No alcohol     ___ Return to work/school ________   Special Instructions:    COMMUNITY REFERRALS UPON DISCHARGE:    Home Health:   PT   OT   RN                 Agency:[facility]    Phone:[phone]   Medical Equipment/Items Ordered:                                                 Agency/Supplier:    My questions have been answered and I understand these instructions. I will adhere to these goals and the provided educational materials after my discharge from the hospital.  Patient/Caregiver Signature _______________________________ Date __________  Clinician Signature _______________________________________ Date __________  Please bring this form and your medication list with you to all your follow-up doctor's appointments.

## 2022-08-25 NOTE — IPOC Note (Signed)
Overall Plan of Care (IPOC) Patient Details Name: Alan Tapia. MRN: [id number] DOB: 1975/06/23  Admitting Diagnosis: Below-knee amputation of left lower extremity  Hospital Problems: Principal Problem:   Below-knee amputation of left lower extremity     Functional Problem List: Nursing Pain, Safety, Endurance, Medication Management, Skin Integrity  PT Balance, Motor, Safety, Sensory, Edema, Endurance  OT Balance, Endurance, Pain, Safety  SLP    TR         Basic ADL's: OT Bathing, Dressing, Toileting     Advanced  ADL's: OT       Transfers: PT Bed Mobility, Bed to Chair, Car, Occupational psychologist, Research scientist (life sciences): PT Psychologist, prison and probation services, Ambulation, Stairs     Additional Impairments: OT    SLP        TR      Anticipated Outcomes Item Anticipated Outcome  Self Feeding    Swallowing      Basic self-care  Mod I at [facility] level  Toileting  Mod I at [facility] level   Bathroom Transfers Mod I at [facility] level  Bowel/Bladder  n/a  Transfers  supervision  Locomotion  w/c supervision, gait TBD  Communication     Cognition     Pain  < 4 with prns  Safety/Judgment  manage w cues   Therapy Plan: PT Intensity: Minimum of 1-2 x/day ,45 to 90 minutes PT Frequency: 5 out of 7 days PT Duration Estimated Length of Stay: 3 weeks OT Intensity: Minimum of 1-2 x/day, 45 to 90 minutes OT Frequency: 5 out of 7 days OT Duration/Estimated Length of Stay: 10-14 days     Team Interventions: Nursing Interventions Pain Management, Medication Management, Discharge Planning, Skin Care/Wound Management, Disease Management/Prevention, Patient/Family Education  PT interventions Ambulation/gait training, Discharge planning, DME/adaptive equipment instruction, Functional mobility training, Therapeutic Activities, UE/LE Strength taining/ROM, Community reintegration, Equities trader education, Museum/gallery curator, Therapeutic Exercise, UE/LE Coordination activities, Wheelchair  propulsion/positioning  OT Interventions Warden/ranger, Firefighter, Discharge planning, Disease mangement/prevention, Fish farm manager, Functional mobility training, Neuromuscular re-education, Pain management, Self Care/advanced ADL retraining, Skin care/wound managment, Patient/family education, Therapeutic Activities, Therapeutic Exercise, UE/LE Strength taining/ROM, Wheelchair propulsion/positioning  SLP Interventions    TR Interventions    SW/CM Interventions Discharge Planning, Psychosocial Support, Patient/Family Education   Barriers to Discharge MD  Medical stability, Wound care, and Weight bearing restrictions  Nursing Decreased caregiver support, Home environment access/layout, Wound Care 2 level 1/2 ba on main, 2 ste bil rail, 13 steps to bed/bath left rail  PT Inaccessible home environment, Home environment access/layout, Weight bearing restrictions    OT Inaccessible home environment, Home environment access/layout, Wound Care, Weight, Weight bearing restrictions    SLP      SW Insurance for SNF coverage, Home environment access/layout     Team Discharge Planning: Destination: PT-Home ,OT- Home , SLP-  Projected Follow-up: PT-Home health PT, OT-  Home health OT, SLP-  Projected Equipment Needs: PT-To be determined, OT- To be determined, SLP-  Equipment Details: PT- , OT-  Patient/family involved in discharge planning: PT- Family member/caregiver, Patient,  OT-Patient, Family member/caregiver, SLP-   MD ELOS: 7-10  Medical Rehab Prognosis:  Excellent Assessment: The patient has been admitted for CIR therapies with the diagnosis of 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 The team will be addressing functional mobility, strength, stamina, balance, safety, adaptive techniques and equipment, self-care, bowel and bladder mgt, patient and caregiver education. Goals have been set at sup to min  a. Anticipated  discharge destination is home.        See Team Conference Notes for weekly updates to the plan of care

## 2022-08-25 NOTE — Progress Notes (Signed)
Occupational Therapy Session Note  Patient Details  Name: Alan Tapia. MRN: [id number] Date of Birth: 09/29/1975  Session 1 Today's Date: 08/25/2022 OT Individual Time: 6503-5465 OT Individual Time Calculation (min): 57 min   Session 2 Today's Date: 08/25/2022 OT Individual Time: 6812-7517 OT Individual Time Calculation (min): 70 min    Short Term Goals: Week 1:  OT Short Term Goal 1 (Week 1): Pt will complete LB dressing with Min A + LRAD. OT Short Term Goal 2 (Week 1): Pt will complete LB bathing with Min A + LRAD. OT Short Term Goal 3 (Week 1): Pt will complete toilet transfer with CGA + LRAD.  Skilled Therapeutic Interventions/Progress Updates:  Session 1   Pt received supine with no c/o pain at rest but reporting "really bad pain everywhere" when asking about phantom pain or residual limb pain. He came to EOB with (S), heavy use of bed rails. He completed lateral scoot transfer to the bariatric BSC with CGA. He required extra time for toileting. He was able to complete hygiene with set up assist on the commode. He came back to EOB with lateral scoot with CGA. Transfer to the w/c with CGA lateral scoot. Min cueing for positioning. Also required cueing for wound vac management as he attempted to loop the entire cord around his neck. He would often not speak to OT, just nodding. Introduced peer support program if pt is interested. While in the hallway waiting for pt to finish toileting his wife approached OT and expressed nursing concerns re bed not being made, pt not being offered a sandwich, and needle caps being thrown in bed. Listened to concerns and offered to relay to nursing director but she declined stating that charge RN over the weekend already addressed. Also provided edu on diet restrictions d/t T2DM and that sandwiches are not kept on the unit. Pt propelled his w/c to the therapy gym with (S), increased time for rest breaks. He began with 5 min forward and 5 min backward on  the BUE ergometer to address UE strengthening and endurance needed for community level w/c management. He required a rest break between trials. He returned to his room and was left sitting up with his wife present, all needs met.    Session 2 Pt received sitting with unrated pain, agreeable to OT session. He reports he will not let pain limit his sessions and declines telling OT what it is rated. He propelled the w/c 35 ft before reporting fatigue and OT pushing him the rest of the way to the gym. He was taken into the parallel bars to work on standing for carryover to ADL transfers. He required use of a gait belt to support the LLE into hip flexion d/t pt being tall and long limb length making him often attempt to weight bear through bottom of limb. He completed 3x stands with min-mod A. Third trial with CGA. HEAVY reliance on the parallel bars to pull himself up. Once standing he was able to maintain static balance with BUE support and unilateral support with close (S). He completed 3x 10 heel raises, heel scoots internally/externally rotating hip, and B hand raises from the bar for intended carryover to eventual stand pivot transfers. He also completed 1x10 residual limb hip flexion and abduction. He was taken to [facility] where he completed a lateral scoot transfer with CGA. He sat EOM and engaged in mirror therapy with great results with movement and tactile stimulation. Hedu provided on benefits of mirror therapy.  He then complete 3x10 modified sit ups EOM to address trunk stabilization and core stability during transfers. He returned to his room and was left sitting up in the w/c with all needs met.     Therapy Documentation Precautions:  Precautions Precautions: Fall, Other (comment) (wound vac) Required Braces or Orthoses: Other Brace Other Brace: L limb guard Restrictions Weight Bearing Restrictions: Yes LLE Weight Bearing: Non weight bearing   Therapy/Group: Individual Therapy  Crissie Reese 08/25/2022, 6:48 AM

## 2022-08-25 NOTE — Progress Notes (Signed)
PROGRESS NOTE   Subjective/Complaints:  BM yesterday. No new concerns. Reports pain controlled overall. Robaxin helping his pain, he tries to avoid using oxycodone.   ROS: +insomnia-improved, + residual limb pain,+phantom pain-controlled, +constipation. Denies fevers, chills, CP, SOB, abd pain, N/V/D, new/worsening paresthesias/weakness, or any other complaints at this time.    Objective:   No results found. Recent Labs    08/23/22 0818 08/25/22 0648  WBC 7.2 7.0  HGB 9.2* 9.2*  HCT 30.1* 31.0*  PLT 375 396    Recent Labs    08/23/22 0818 08/25/22 0648  NA 134* 137  K 3.7 3.8  CL 101 104  CO2 26 25  GLUCOSE 181* 173*  BUN 6 8  CREATININE 1.03 1.11  CALCIUM 7.7* 7.8*     Intake/Output Summary (Last 24 hours) at 08/25/2022 1022 Last data filed at 08/25/2022 0547 Gross per 24 hour  Intake 600 ml  Output 2000 ml  Net -1400 ml         Physical Exam: Vital Signs Blood pressure (!) 146/76, pulse 80, temperature 98.7 F (37.1 C), temperature source Oral, resp. rate 18, height 6\' 4"  (1.93 m), weight (!) 149.5 kg, SpO2 94 %.  Constitutional: No distress . Vital signs reviewed. Laying in bed.  HEENT: NCAT, EOMI, oral membranes moist Neck: supple Cardiovascular: RRR without murmur. No JVD    Respiratory/Chest: CTA Bilaterally without wheezes or rales. Normal effort    GI/Abdomen: BS +, non-tender, non-distended Ext: no clubbing or cyanosis Psych: pleasant and cooperative Skin: dry, warm. Chronic changes distal RLE d/t lymphedema, trace to 1+ edema noted today. No sign of breakdown. Left BKA with vac/dressing. No significant drainage in vac MSK: Chronic lymphedema RLE. LLE in Vac/dressing with shrinker over top   PRIOR EXAMS:  Neurologic: Alert and oriented x 3. Normal insight and awareness. Intact Memory. Normal language and speech. Cranial nerve exam unremarkable. MMT: UE 5/5 prox to distal. RLE 4/5 prox to  distal. LLE: 2/5 HF. Sensory exam appears grossly normal for light touch and pain in all 4 limbs. No limb ataxia or cerebellar signs. No abnormal tone appreciated.     Assessment/Plan: 1. Functional deficits which require 3+ hours per day of interdisciplinary therapy in a comprehensive inpatient rehab setting. Physiatrist is providing close team supervision and 24 hour management of active medical problems listed below. Physiatrist and rehab team continue to assess barriers to discharge/monitor patient progress toward functional and medical goals  Care Tool:  Bathing    Body parts bathed by patient: Right arm, Left arm, Chest, Abdomen, Front perineal area, Face, Right upper leg, Left upper leg   Body parts bathed by helper: Buttocks, Right lower leg Body parts n/a: Left lower leg   Bathing assist Assist Level: Minimal Assistance - Patient > 75%     Upper Body Dressing/Undressing Upper body dressing   What is the patient wearing?: Pull over shirt    Upper body assist Assist Level: Set up assist    Lower Body Dressing/Undressing Lower body dressing      What is the patient wearing?: Pants     Lower body assist Assist for lower body dressing: Moderate Assistance - Patient 50 -  74%     Toileting Toileting Toileting Activity did not occur Press photographer and hygiene only): N/A (no void or bm)  Toileting assist Assist for toileting: Dependent - Patient 0%     Transfers Chair/bed transfer  Transfers assist     Chair/bed transfer assist level: Moderate Assistance - Patient 50 - 74%     Locomotion Ambulation   Ambulation assist   Ambulation activity did not occur: Safety/medical concerns          Walk 10 feet activity   Assist  Walk 10 feet activity did not occur: Safety/medical concerns        Walk 50 feet activity   Assist Walk 50 feet with 2 turns activity did not occur: Safety/medical concerns         Walk 150 feet activity   Assist  Walk 150 feet activity did not occur: Safety/medical concerns         Walk 10 feet on uneven surface  activity   Assist Walk 10 feet on uneven surfaces activity did not occur: Safety/medical concerns         Wheelchair     Assist Is the patient using a wheelchair?: Yes Type of Wheelchair: Manual    Wheelchair assist level: Supervision/Verbal cueing Max wheelchair distance: 100    Wheelchair 50 feet with 2 turns activity    Assist        Assist Level: Supervision/Verbal cueing   Wheelchair 150 feet activity     Assist      Assist Level: Minimal Assistance - Patient > 75%   Blood pressure (!) 146/76, pulse 80, temperature 98.7 F (37.1 C), temperature source Oral, resp. rate 18, height  (1.93 m), weight (!) 149.5 kg, SpO2 94 %.  Medical Problem List and Plan: 1. 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  Antithrombotics: -DVT/anticoagulation:  Pharmaceutical: Lovenox  QD             -antiplatelet therapy: N/A 3. Pain Management: Tylenol  TID, Oxycodone 5-10mg  q4h prn. Tramadol  q6h PRN.              -add robaxin  q6h prn for spasms.  -08/23/22 having phantom pain interfering with sleep, will add gabapentin  TID for now, monitor for effect/side effects.  4/15 reports pain controlled 4. Mood/Behavior/Sleep: LCSW to follow for evaluation and support.              -antipsychotic agents: N/A -08/23/22 poor sleep, ordered melatonin  QHS, also has trazodone PRN--improved 08/24/22 5. Neuropsych/cognition: This patient is capable of making decisions on his own behalf. 6. Skin/Wound Care: Monitor wound for healing             --limb protector when up -he's post-op day #7. Spoke with Dr Lajoyce Corners who recommended changing vac to prevena and continuing for  another week.  7. Fluids/Electrolytes/Nutrition: Monitor I/O. Continue Vitamin C and Zinc and MVI. Monitor routine Monday labs.              --add juven BID additionally -08/23/22 BMP with mild hyponatremia 134, otherwise fairly unremarkable, monitor on routine Monday labs -4/15 Na up to 137, improved 8. T2DM: Hgb A1c- 10.3 and poorly controlled. --dietician has  added double portion as well as snacks TID to help with hunger/apptite             --monitor BS ac/hs and titrate insulin as indicated             --continue Insulin glargine 14 units QD and SSI  -4/15 well controlled, continue current CBG (last 3)  Recent Labs    08/24/22 1644 08/24/22 2111 08/25/22 0603  GLUCAP 170* 166* 151*     9. Dilated CM of unclear etiology: On Lasix  QD, Entresto 49-51mg  BID (dose increased today) and spironolactone  QD             --will add low salt restrictions. Monitor daily weights --add low dose KCL due to recurrent hypokalemia requiring intermittent supplementation --recheck BMET 04/13 and on 04/15 as on spiro -pt is having nausea and ?diarrhea with coreg increase. Will convert him to metoprolol  bid to start--->titrate as needed. -08/23/22 K+ 3.7; pt educated on med changes and importance of compliance. No weight done today, ordered daily weights.  -08/24/22 no wt again, asked nursing to please make sure daily wt done; slight increase in RLE swelling noted, but mild, monitor for now [facility] Weights   08/24/22 1317 08/24/22 1858 08/25/22 0511  Weight: (!) 149.7 kg (!) 149.7 kg (!) 149.5 kg    10. HTN: Monitor BP TID--now on Lasix, Entresto and Spironolactone.             -metoprolol as above  -08/23/22 BPs elevated but recent change to meds, monitor for now  -4/15 mildly elevated, monitor for now Vitals:   08/22/22 2008 08/23/22 0502 08/23/22 0949 08/23/22 0949  BP: (!) 156/92 (!) 168/87 (!) 168/91 (!) 168/91   08/23/22 1401 08/23/22 1928 08/24/22 0600 08/24/22 1317  BP: (!) 169/96  (!) 148/80 (!) 149/92 (!) 152/78   08/24/22 1932 08/25/22 0300  BP: (!) 163/86 (!) 146/76    11. Morbid obesity: Continue to educate on diet, exercise and compliance to help promote overall health and mobility.    12. Anemia:   -08/23/22 HGb 9.2, relatively stable; monitor on routine labs  -4/15 HGB stable at 9.2 13. Constipation: usually goes daily at home  -08/24/22 no BM in 2 days, will start miralax 17g QD for now; monitor  -4/15 reports MB yesterday  LOS: 3 days A FACE TO FACE EVALUATION WAS PERFORMED  Fanny Dance 08/25/2022, 10:22 AM

## 2022-08-25 NOTE — Progress Notes (Signed)
Inpatient Rehabilitation Center Individual Statement of Services  Patient Name:  Alan Tapia.  Date:  08/25/2022  Welcome to the Inpatient Rehabilitation Center.  Our goal is to provide you with an individualized program based on your diagnosis and situation, designed to meet your specific needs.  With this comprehensive rehabilitation program, you will be expected to participate in at least 3 hours of rehabilitation therapies Monday-Friday, with modified therapy programming on the weekends.  Your rehabilitation program will include the following services:  Physical Therapy (PT), Occupational Therapy (OT), 24 hour per day rehabilitation nursing, Therapeutic Recreaction (TR), Neuropsychology, Care Coordinator, Rehabilitation Medicine, Nutrition Services, and Pharmacy Services  Weekly team conferences will be held on wednesday to discuss your progress.  Your Inpatient Rehabilitation Care Coordinator will talk with you frequently to get your input and to update you on team discussions.  Team conferences with you and your family in attendance may also be held.  Expected length of stay: 10-18 days  Overall anticipated outcome: Independent-supervision level  Depending on your progress and recovery, your program may change. Your Inpatient Rehabilitation Care Coordinator will coordinate services and will keep you informed of any changes. Your Inpatient Rehabilitation Care Coordinator's name and contact numbers are listed  below.  The following services may also be recommended but are not provided by the Inpatient Rehabilitation Center:  Driving Evaluations Home Health Rehabiltiation Services Outpatient Rehabilitation Services Vocational Rehabilitation   Arrangements will be made to provide these services after discharge if needed.  Arrangements include referral to agencies that provide these services.  Your insurance has been verified to be:  Medicaid [place] effective 5/1 Your primary doctor is:   None  Pertinent information will be shared with your doctor and your insurance company.  Inpatient Rehabilitation Care Coordinator:  Dossie Der, [place] [phone] or Luna Glasgow  Information discussed with and copy given to patient by: Lucy Chris, 08/25/2022, 10:52 AM

## 2022-08-25 NOTE — Progress Notes (Addendum)
Inpatient Rehabilitation Care Coordinator Assessment and Plan Patient Details  Name: Alan Tapia. MRN: [id number] Date of Birth: 1976-05-02  Today's Date: 08/25/2022  Hospital Problems: Principal Problem:   Below-knee amputation of left lower extremity  Past Medical History:  Past Medical History:  Diagnosis Date   Hypertension    Uncontrolled type 2 diabetes mellitus with hyperglycemia, with long-term current use of insulin 06/06/2022   Past Surgical History:  Past Surgical History:  Procedure Laterality Date   AMPUTATION Left 08/15/2022   Procedure: LEFT BELOW KNEE AMPUTATION;  Surgeon: Nadara Mustard, MD;  Location: [facility] OR;  Service: Orthopedics;  Laterality: Left;   Social History:  reports that he has never smoked. He has never used smokeless tobacco. He reports that he does not drink alcohol and does not use drugs.  Family / Support Systems Marital Status: Married Patient Roles: Spouse, Other (Comment) (son) Spouse/Significant Other: Alan Tapia [phone] Other Supports: Alan Tapia [phone] Anticipated Caregiver: Wife Ability/Limitations of Caregiver: Was working but has not since he was admitted to the hospital Caregiver Availability: 24/7 Family Dynamics: Close with extended family and friends, recently moved here from [name] and is trying to switch Medicaid and get services  Social History Preferred language: English Religion: Unknown Cultural Background: No issues Education: Automotive engineer educated Health Literacy - How often do you need to have someone help you when you read instructions, pamphlets, or other written material from your doctor or pharmacy?: Never Writes: Yes Employment Status: Unemployed Date Retired/Disabled/Unemployed: Engineer, site in [place] Issues: No issues Guardian/Conservator: None-according to MD pt is able to make his own decisions while here   Abuse/Neglect Abuse/Neglect Assessment Can Be Completed:  Yes Physical Abuse: Denies Verbal Abuse: Denies Sexual Abuse: Denies Exploitation of patient/patient's resources: Denies Self-Neglect: Denies  Patient response to: Social Isolation - How often do you feel lonely or isolated from those around you?: Never  Emotional Status Pt's affect, behavior and adjustment status: Pt is motivated to recover and regain his independence he is aware it will take time to heal and time to get his prothesis. He is glad to be here and feels will learn a lot while here Recent Psychosocial Issues: other health issues Psychiatric History: No history would benefit from seeing neuro-psych while here. Will place him on the list to be seen Substance Abuse History: No issues  Patient / Family Perceptions, Expectations & Goals Pt/Family understanding of illness & functional limitations: Pt and wife are able to explain his amputation and the process it took to get to this place. Both talk with the MD and feel they have a good understanding of his treatment plan moving forward. 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, Runner, broadcasting/film/video, son, friend, etc Anticipated changes in roles/activities/participation: resume Pt/family expectations/goals: Pt states: " I hope to do well and become as independent as I can be when I leave here."  Wife states: " We need a lot to be done while here."  [employer]: Other (Comment) (Applying for any resources eligible for) Premorbid Home Care/DME Agencies: None Transportation available at discharge: wife Is the patient able to respond to transportation needs?: Yes In the past 12 months, has lack of transportation kept you from medical appointments or from getting medications?: No In the past 12 months, has lack of transportation kept you from meetings, work, or from getting things needed for daily living?: No Resource referrals recommended: Neuropsychology  Discharge Planning Living Arrangements:  Spouse/significant other Support Systems: Spouse/significant other, Other relatives Type of Residence: Private residence  Insurance Resources: [employer] (specify county) (applying for [employer] to be acitve 5/1) Financial Resources: Other (Comment) (Not employed-food stamps, section 8 etc) Financial Screen Referred: Yes Living Expenses: Rent Money Management: Patient, Spouse Does the patient have any problems obtaining your medications?: No Home Management: wife Patient/Family Preliminary Plans: Return with wife who they have a section 8 house and will need much done regarding ramp and rails along with handicapped accessible bathroom. Made aware will not have all done by the time he leaves here. Will need to be taught how to do stairs etc Care Coordinator Barriers to Discharge: Insurance for SNF coverage, Home environment access/layout Care Coordinator Anticipated Follow Up Needs: HH/OP  Clinical Impression Pleasant couple who are supportive of one another. Pt is motivated to recover and regain his independence, wife has stayed with him while here. Work on discharge needs. Have placed on neuro-psych list to be seen  Alan Tapia 08/25/2022, 10:18 AM

## 2022-08-25 NOTE — Progress Notes (Signed)
   08/25/22 1100  Spiritual Encounters  Type of Visit Initial  Care provided to: Patient;Family  Referral source IDT Rounds  Reason for visit Routine spiritual support  OnCall Visit No  Spiritual Framework  Presenting Themes Caregiving needs  Values/beliefs faith  Community/Connection Family  Family Stress Factors Financial concerns;Lack of caregivers;Lack of knowledge;Exhausted;Loss of control  Interventions  Spiritual Care Interventions Made Established relationship of care and support;Reflective listening;Normalization of emotions  Intervention Outcomes  Outcomes Reduced fear;Reduced anxiety   Ch responded to request for emotional and spiritual support while rounding on the floor. Pt was in therapy but his wife was in the room. [name] needs help to take care of pt when he is discharge. She is struggling financially. She lost her job but looking for employment. She is trying to care for both the patient and her children,  but at times, it is overwhelming. She needs guidance and lacks resources. Ch demonstrated care and concerns. No follow-up needed at this time.

## 2022-08-25 NOTE — Progress Notes (Signed)
Physical Therapy Session Note  Patient Details  Name: Alan Tapia. MRN: [id number] Date of Birth: 07/08/1975  Today's Date: 08/25/2022 PT Individual Time: 2841-3244 PT Individual Time Calculation (min): 71 min   Short Term Goals: Week 1:  PT Short Term Goal 1 (Week 1): Pt will transfer sit to stand w/ mod A. PT Short Term Goal 2 (Week 1): Pt will transfer bed<> w/c w/ min A. PT Short Term Goal 3 (Week 1): PT will assess gait.  Skilled Therapeutic Interventions/Progress Updates:     Pt seated in [facility] upon arrival, eager for therapy. Pt reports L LE pain 7/10 at start of session, progressed to 10/10 at end of session, therapist offered to notify nurse to receive pain medicine, pt opted to hold pain medicine until after session. Therapist notified nurse at end of session, and provided seated rest breaks and repositioning as needed during session. Pt wife reports concerns of skin rash behind pt's ear, notified nursing at end of session. Pt agreeable to therapy.   Treatment Session focused on increasing activity tolerance, endurance, general strengthening, and independence with functional mobility.   Pt self propelled WC from room to ortho gym with supervision, verbal cues provided for energy conservation, pt required intermittent seated rest breaks due to fatigue. Pt performed lateral scoot transfer WC to mat table with therapist stabilizing W/C and providing min A for L LE to ensure L LE NWB precautions.   Pt performed sit<>stand x5 from elevated mat table with RW and therapist stabilizing front of RW and providing CGA to min A, pt requires CGA for static standing balance for stability. Pt R LE significantly externally rotated, pt attempted heel raise to pivot R LE to neutral with therapist providing verbal cues to push through B UE but pt unable. Therapist transported pt to // bars to attempt heel raises, however pt unable to stand from [facility] height and maintain L LE NWB precautions due to pt  height and length of residual limb, attempted with kicking L LE out straight, as well as pt lifting L LE off of floor, and therapist providing mod A however pt unable to fully stand. Pt reports phantom pain/sensation of L foot. Discussed trialing mirror therapy during future session. Pt performed WC pushups x10 with therapist providing min A to maintain L LE NWB precautions. Pt performed lateral scoot transfer as described above. Pt attempt sit to stand from elevated mat table with therapist stabilzing RW and providing min A from elevated mat table x3 but unable to fully stand due to fatigue. Pt performed sit to supine and supine to sit with supervision. Pt performed 1x10 bilateral SLR, hip abduction, and glute sets. Education provided on supine positioning and benefits of reducing contracture.  Pt verbalized understaning.   Pt performed lateral scoot transfer mat table<>WC as described above. Pt transported dependent in [facility] to room. Pt seated in WC at end of session with chair alarm on, and wife and chaplin in room.  Therapy Documentation Precautions:  Precautions Precautions: Fall, Other (comment) (wound vac) Required Braces or Orthoses: Other Brace Other Brace: L limb guard Restrictions Weight Bearing Restrictions: Yes LLE Weight Bearing: Non weight bearing   Therapy/Group: Individual Therapy  [facility] Ambrose Finland, [place], DPT  08/25/2022, 7:41 AM

## 2022-08-26 DIAGNOSIS — I420 Dilated cardiomyopathy: Secondary | ICD-10-CM

## 2022-08-26 DIAGNOSIS — S88112D Complete traumatic amputation at level between knee and ankle, left lower leg, subsequent encounter: Secondary | ICD-10-CM | POA: Diagnosis not present

## 2022-08-26 DIAGNOSIS — I10 Essential (primary) hypertension: Secondary | ICD-10-CM | POA: Diagnosis not present

## 2022-08-26 DIAGNOSIS — R079 Chest pain, unspecified: Secondary | ICD-10-CM | POA: Diagnosis not present

## 2022-08-26 DIAGNOSIS — E1165 Type 2 diabetes mellitus with hyperglycemia: Secondary | ICD-10-CM | POA: Diagnosis not present

## 2022-08-26 LAB — GLUCOSE, CAPILLARY
Glucose-Capillary: 166 mg/dL — ABNORMAL HIGH (ref 70–99)
Glucose-Capillary: 216 mg/dL — ABNORMAL HIGH (ref 70–99)
Glucose-Capillary: 230 mg/dL — ABNORMAL HIGH (ref 70–99)
Glucose-Capillary: 190 mg/dL — ABNORMAL HIGH (ref 70–99)

## 2022-08-26 LAB — TROPONIN I (HIGH SENSITIVITY)
Troponin I (High Sensitivity): 4 ng/L (ref <18)
Troponin I (High Sensitivity): 4 ng/L (ref <18)
Troponin I (High Sensitivity): 5 ng/L (ref <18)

## 2022-08-26 NOTE — Progress Notes (Signed)
Occupational Therapy Session Note  Patient Details  Name: Alan Tapia. MRN: [id number] Date of Birth: 1976/05/07  Session 1  Today's Date: 08/26/2022 OT Individual Time: 1010-1106 OT Individual Time Calculation (min): 56 min  and Today's Date: 08/26/2022 OT Missed Time: 15 Minutes Missed Time Reason: Unavailable (comment) (OT with previous pt)   Session 2 Today's Date: 08/26/2022 OT Individual Time: 4098-1191 OT Individual Time Calculation (min): 29 min    Short Term Goals: Week 1:  OT Short Term Goal 1 (Week 1): Pt will complete LB dressing with Min A + LRAD. OT Short Term Goal 2 (Week 1): Pt will complete LB bathing with Min A + LRAD. OT Short Term Goal 3 (Week 1): Pt will complete toilet transfer with CGA + LRAD.  Skilled Therapeutic Interventions/Progress Updates:    Session 1 Pt received sitting with high levels of pain but reporting he is premedicated, agreeable to OT session. He propelled the w/c 125 ft to the therapy gym with frequent rest breaks required. He completed a lateral scoot transfer to the mat with CGA. He stood from [facility] with min A using the RW! CGA for static standing balance. Attempted several hopping/pre-hopping activities including heel lift and tricep extension for prep to complete stand pivot transfer. X4 repetitions completed with similar assist. He reported need to use the bathroom. He returned to his w/c and to his room. He completed lateral scoot w/c > bed > BSC. He voided urine and BM and completed hygiene with set up assist. He returned to the bed and then to w/c with CGA. Cleared wife to complete this transfer only. He was left sitting up with all needs met.    Session 2 Pt received sitting with high levels of pain but reporting he is premedicated, agreeable to OT session. Pt was taken via w/c to the therapy gym for time management. He transferred to the mat via lateral scoot with CGA. He worked on sit <> stand from [employer] with mat elevated and RW-  requiring CGA. In standing he completed hopping backward with min A. He was unable to clear enough to hop forward. He worked on functional reaching forward with a 6lb dumbbell in standing, alternating UE to challenge standing balance. He returned to his w/c and to his room. He was left sitting up with all needs met.     Therapy Documentation Precautions:  Precautions Precautions: Fall, Other (comment) (wound vac) Required Braces or Orthoses: Other Brace Other Brace: L limb guard Restrictions Weight Bearing Restrictions: Yes LLE Weight Bearing: Non weight bearing  Therapy/Group: Individual Therapy  Crissie Reese 08/26/2022, 6:47 AM

## 2022-08-26 NOTE — Progress Notes (Addendum)
Physical Therapy Session Note  Patient Details  Name: Alan Tapia. MRN: [id number] Date of Birth: 08-10-1975  Today's Date: 08/26/2022 PT Individual Time: 0800-0830, 1445-1540 PT Individual Time Calculation (min): 30 min, 55 min  Short Term Goals: Week 1:  PT Short Term Goal 1 (Week 1): Pt will transfer sit to stand w/ mod A. PT Short Term Goal 2 (Week 1): Pt will transfer bed<> w/c w/ min A. PT Short Term Goal 3 (Week 1): PT will assess gait.  Skilled Therapeutic Interventions/Progress Updates:     Treatment Session 1   Pt seated in [facility] with wife in room upon arrival. Pt complains of dizziness, nausea, and chest pain. Notified nursing and Dr. Benjie Karvonen. Pt also complaining of significant pain, unable to quantify. Therapist checked vitals while seated in WC BP 171/102, HR 91. Pt actively vomitting during session, and still eager to participate in therapy.  Dr. Benjie Karvonen in room at end of session, as well as nurse to perform EKG. Post EKG pt reports symptoms have subsided, EKG negative and requesting to go to the gym for therapy. Verified with nursing EKG negative and nursing reports pt able to go to gym for therapy as long as he is able to tolerate.   Pt self propelled WC room to main gym with supervision and  increased time due to fatigue. Education provided regarding donning/doffing shrinker, and limb protector. Pt doffed leg rests, limb protector and shrinker with supervision. Contacted MD via secure chat, and he requested hold for therapy until symptoms subside and lab values collected. Pt transported dependent in [facility] to room. Pt seated in [facility] with brakes locked, and wife in room at end of session.   Treatment Session 2  Pt seated in [facility] with wife in room upon arrival. Pt agreeable to therapy. Pt reports 8/10 pain, denies wanting medicine until after session as he does not want the side effects to interfere with participation in therapy. Pt provided seated rest breaks and repositioning as needed  for pain, however pain did not interfere with pt participation in session.   Pt self propelled WC from room to main gym with supervision, verbal cues provided for steering. Pt performed attempted sit <>stand from WC x7 in // bars with gait belt wrapped around L LE and // bar to maintain L LE NWB precautions, and therapist providing mod A; pt demos great initiation and able to get about 50% there but unable to reach full standing, therapist attempted to guard R LE to assist with R LE knee extension but pt complains of R knee pain. Pt performed lateral scoot transfer with supervision and therapist stabilizing WC. Pt performed sit<>stand x4 with CGA from elevated mat table (hips higher than knees) with therapist stabilizing RW.  During last standing trial, pt utilzied B UE to pivot R LE into neutral rotation; pt peformed 1x10 standing L hip abduction, flexion, and extension, 1x10 heel raises, 1x10 mini squats, 1x10 raising R UE off of walker, and 1x10 raising L UE off of walker with therapist providing CGA. Pt demos R lateral trunk lean intermittent but able to correct with verbal cuing. Pt performed lateral scoot transfer mat table <> WC with supervision, and therapist stabilizing WC.   Pt transported dependent in [facility] to room. Pt reports being frustrated, "I feel like a failure" when standing from // bars because the therapist have to provide assistance, and it appears he feels a lot of pressure knowing that team conference occurs on Wednesday. Utilized therapeutic use  of self to provide support, encouragement and reassurance. Education provided on plan of care, progression of standing, and purpose of team conference. Pt and pt wife verbalized understanding and appeared to be reassured. Pt wife asking about wheelchair accessible housing, and the possibility of power wheelchair. Discussed insurance may only cover power wheelchair or prosthetic, pt reports in the instance it would only cover one, he would want the  prosthetic. Deferred housing question to social work. Pt seated in WC at end of session with all needs within reach, and wife in room.    Therapy Documentation Precautions:  Precautions Precautions: Fall, Other (comment) (wound vac) Required Braces or Orthoses: Other Brace Other Brace: L limb guard Restrictions Weight Bearing Restrictions: Yes LLE Weight Bearing: Non weight bearing       Therapy/Group: Individual Therapy  [facility] Fleming, Leon, DPT  08/26/2022, 7:20 AM

## 2022-08-26 NOTE — Progress Notes (Addendum)
Patient ID: Alan Tapia., male   DOB: 10-31-1975, 47 y.o.   MRN: [id number]  Letter given to pt and wife regarding adaptations for their home and her being out of work. Wife to let worker know if further information needed.  3;15 PM Met with wife to discuss questions and concerns. Pt was suppose to get double proteins for his healing and snacks due to his diabetes which he is not have consulted Abby Coggins-Nutrition regarding this and to talk with both wife and pt about his diet and what is best for him to heal and for his diabetes. Have also reached out to Aeronautical [employer] regarding some concerns they have she will follow up with. Discussed DME and will try to see what is needed and see if has insurance at this time or not due to in between [place] and [place]. Try to resolve some of these issues.

## 2022-08-26 NOTE — Progress Notes (Signed)
PROGRESS NOTE   Subjective/Complaints:  Pt reports chest pain and nausea since he got his entresto today at 6am.  He reports he has this feeling daily since he started getting entresto. He says this happens most times after he gets entresto.  He has a EKG -no acute changes. Feels better this now.   ROS: +insomnia-improved, + residual limb pain, +phantom pain-controlled, +constipation. Denies fevers, chills,  SOB, abd pain, /D, new/worsening paresthesias/weakness, or any other complaints at this time.    CP and nausea this AM- improved  Objective:   No results found. Recent Labs    08/25/22 0648  WBC 7.0  HGB 9.2*  HCT 31.0*  PLT 396    Recent Labs    08/25/22 0648  NA 137  K 3.8  CL 104  CO2 25  GLUCOSE 173*  BUN 8  CREATININE 1.11  CALCIUM 7.8*     Intake/Output Summary (Last 24 hours) at 08/26/2022 0920 Last data filed at 08/26/2022 0735 Gross per 24 hour  Intake 472 ml  Output 400 ml  Net 72 ml         Physical Exam: Vital Signs Blood pressure (!) 172/92, pulse 91, temperature 98.5 F (36.9 C), temperature source Oral, resp. rate 18, height  (1.93 m), weight (!) 153 kg, SpO2 99 %.  Constitutional: No distress . Vital signs reviewed. Sitting in chair, appears nauseous  HEENT: NCAT, EOMI, oral membranes moist Neck: supple Cardiovascular: RRR without murmur. No JVD    Respiratory/Chest: CTA Bilaterally without wheezes or rales. Normal effort    GI/Abdomen: BS +, non-tender, non-distended Ext: no clubbing or cyanosis Psych: pleasant and cooperative Skin: dry, warm. Chronic changes distal RLE d/t lymphedema, trace to 1+ edema noted today. No sign of breakdown. Left BKA with vac/dressing. No significant drainage in vac MSK: Chronic lymphedema RLE. LLE in Vac/dressing with shrinker over top   PRIOR EXAMS:  Neurologic: Alert and oriented x 3. Normal insight and awareness. Intact Memory. Normal  language and speech. Cranial nerve exam unremarkable. MMT: UE 5/5 prox to distal. RLE 4/5 prox to distal. LLE: 2/5 HF. Sensory exam appears grossly normal for light touch and pain in all 4 limbs. No limb ataxia or cerebellar signs. No abnormal tone appreciated.     Assessment/Plan: 1. Functional deficits which require 3+ hours per day of interdisciplinary therapy in a comprehensive inpatient rehab setting. Physiatrist is providing close team supervision and 24 hour management of active medical problems listed below. Physiatrist and rehab team continue to assess barriers to discharge/monitor patient progress toward functional and medical goals  Care Tool:  Bathing    Body parts bathed by patient: Right arm, Left arm, Chest, Abdomen, Front perineal area, Face, Right upper leg, Left upper leg   Body parts bathed by helper: Buttocks, Right lower leg Body parts n/a: Left lower leg   Bathing assist Assist Level: Minimal Assistance - Patient > 75%     Upper Body Dressing/Undressing Upper body dressing   What is the patient wearing?: Pull over shirt    Upper body assist Assist Level: Set up assist    Lower Body Dressing/Undressing Lower body dressing  What is the patient wearing?: Pants     Lower body assist Assist for lower body dressing: Moderate Assistance - Patient 50 - 74%     Toileting Toileting Toileting Activity did not occur (Clothing management and hygiene only): N/A (no void or bm)  Toileting assist Assist for toileting: Dependent - Patient 0%     Transfers Chair/bed transfer  Transfers assist     Chair/bed transfer assist level: Moderate Assistance - Patient 50 - 74%     Locomotion Ambulation   Ambulation assist   Ambulation activity did not occur: Safety/medical concerns          Walk 10 feet activity   Assist  Walk 10 feet activity did not occur: Safety/medical concerns        Walk 50 feet activity   Assist Walk 50 feet with 2  turns activity did not occur: Safety/medical concerns         Walk 150 feet activity   Assist Walk 150 feet activity did not occur: Safety/medical concerns         Walk 10 feet on uneven surface  activity   Assist Walk 10 feet on uneven surfaces activity did not occur: Safety/medical concerns         Wheelchair     Assist Is the patient using a wheelchair?: Yes Type of Wheelchair: Manual    Wheelchair assist level: Supervision/Verbal cueing Max wheelchair distance: 100    Wheelchair 50 feet with 2 turns activity    Assist        Assist Level: Supervision/Verbal cueing   Wheelchair 150 feet activity     Assist      Assist Level: Minimal Assistance - Patient > 75%   Blood pressure (!) 172/92, pulse 91, temperature 98.5 F (36.9 C), temperature source Oral, resp. rate 18, height  (1.93 m), weight (!) 153 kg, SpO2 99 %.  Medical Problem List and Plan: 1. 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  Antithrombotics: -DVT/anticoagulation:  Pharmaceutical: Lovenox  QD             -antiplatelet therapy: N/A 3. Pain Management: Tylenol  TID, Oxycodone 5-10mg  q4h prn. Tramadol  q6h PRN.              -add robaxin  q6h prn for spasms.  -08/23/22 having phantom pain interfering with sleep, will add gabapentin  TID for now, monitor for effect/side effects.  4/15 reports pain controlled 4. Mood/Behavior/Sleep: LCSW to follow for evaluation and support.              -antipsychotic agents: N/A -08/23/22 poor sleep, ordered melatonin  QHS, also has trazodone PRN--improved 08/24/22 5. Neuropsych/cognition: This patient is capable of making decisions on his own behalf. 6. Skin/Wound Care: Monitor wound for healing             --limb protector when  up -he's post-op day #7. Spoke with Dr Lajoyce Corners who recommended changing vac to prevena and continuing for another week.  7. Fluids/Electrolytes/Nutrition: Monitor I/O. Continue Vitamin C and Zinc and MVI. Monitor routine Monday labs.              --add juven BID additionally -08/23/22 BMP with mild hyponatremia 134,  otherwise fairly unremarkable, monitor on routine Monday labs -4/15 Na up to 137, improved 8. T2DM: Hgb A1c- 10.3 and poorly controlled. --dietician has added double portion as well as snacks TID to help with hunger/apptite             --monitor BS ac/hs and titrate insulin as indicated             --continue Insulin glargine 14 units QD and SSI  -4/16 fair control, continue current regimen for now CBG (last 3)  Recent Labs    08/25/22 1707 08/25/22 2107 08/26/22 0614  GLUCAP 133* 190* 166*     9. Dilated CM of unclear etiology: On Lasix 40mg  QD, Entresto 49-51mg  BID (dose increased today) and spironolactone 25mg  QD             --will add low salt restrictions. Monitor daily weights --add low dose KCL due to recurrent hypokalemia requiring intermittent supplementation --recheck BMET 04/13 and on 04/15 as on spiro -pt is having nausea and ?diarrhea with coreg increase. Will convert him to metoprolol 25mg  bid to start--->titrate as needed. -08/23/22 K+ 3.7; pt educated on med changes and importance of compliance. No weight done today, ordered daily weights.  -08/24/22 no wt again, asked nursing to please make sure daily wt done; slight increase in RLE swelling noted, but mild, monitor for now -4/16 Will contact cardiology regarding DC of entresto  Filed Weights   08/24/22 1858 08/25/22 0511 08/26/22 0500  Weight: (!) 149.7 kg (!) 149.5 kg (!) 153 kg    10. HTN: Monitor BP TID--now on Lasix, Entresto and Spironolactone.             -metoprolol as above  -08/23/22 BPs elevated but recent change to meds, monitor for now  -4/15 mildly elevated, monitor for now  -4/16 Entresto DC,  will consider additional medication if BP remains elevated, will discuss with cardiology  Vitals:   08/23/22 0949 08/23/22 0949 08/23/22 1401 08/23/22 1928  BP: (!) 168/91 (!) 168/91 (!) 169/96 (!) 148/80   08/24/22 0600 08/24/22 1317 08/24/22 1932 08/25/22 0300  BP: (!) 149/92 (!) 152/78 (!) 163/86 (!) 146/76   08/25/22 1502 08/25/22 1938 08/26/22 0617 08/26/22 0734  BP: (!) 154/87 (!) 150/76 (!) 157/84 (!) 172/92    11. Morbid obesity: Continue to educate on diet, exercise and compliance to help promote overall health and mobility.    12. Anemia:   -08/23/22 HGb 9.2, relatively stable; monitor on routine labs  -4/15 HGB stable at 9.2 13. Constipation: usually goes daily at home  -08/24/22 no BM in 2 days, will start miralax 17g QD for now; monitor  -4/15 reports MB yesterday 14.  Chest pain and nausea, improving  -Check EKG-NSR, Toponin- 4  -Pt reports this is related to his use of entresto, will DC this medication, He says he is feeling better now  LOS: 4 days A FACE TO FACE EVALUATION WAS PERFORMED  Fanny Dance 08/26/2022, 9:20 AM

## 2022-08-27 DIAGNOSIS — I5021 Acute systolic (congestive) heart failure: Secondary | ICD-10-CM

## 2022-08-27 DIAGNOSIS — I10 Essential (primary) hypertension: Secondary | ICD-10-CM | POA: Diagnosis not present

## 2022-08-27 DIAGNOSIS — S88112D Complete traumatic amputation at level between knee and ankle, left lower leg, subsequent encounter: Secondary | ICD-10-CM | POA: Diagnosis not present

## 2022-08-27 DIAGNOSIS — E1165 Type 2 diabetes mellitus with hyperglycemia: Secondary | ICD-10-CM | POA: Diagnosis not present

## 2022-08-27 DIAGNOSIS — I509 Heart failure, unspecified: Secondary | ICD-10-CM

## 2022-08-27 DIAGNOSIS — I420 Dilated cardiomyopathy: Secondary | ICD-10-CM | POA: Diagnosis not present

## 2022-08-27 LAB — GLUCOSE, CAPILLARY
Glucose-Capillary: 140 mg/dL — ABNORMAL HIGH (ref 70–99)
Glucose-Capillary: 144 mg/dL — ABNORMAL HIGH (ref 70–99)
Glucose-Capillary: 106 mg/dL — ABNORMAL HIGH (ref 70–99)
Glucose-Capillary: 119 mg/dL — ABNORMAL HIGH (ref 70–99)

## 2022-08-27 MED ORDER — ENOXAPARIN SODIUM 80 MG/0.8ML IJ SOSY
75.0000 mg | PREFILLED_SYRINGE | INTRAMUSCULAR | Status: DC
  Administered 2022-08-27 – 2022-09-03 (×8): 75 mg via SUBCUTANEOUS
  Filled 2022-08-27 (×8): qty 0.75

## 2022-08-27 MED ORDER — METHOCARBAMOL 750 MG PO TABS
750.0000 mg | ORAL_TABLET | Freq: Four times a day (QID) | ORAL | Status: DC | PRN
  Administered 2022-08-27 – 2022-09-09 (×7): 750 mg via ORAL
  Filled 2022-08-27 (×7): qty 1

## 2022-08-27 NOTE — Progress Notes (Signed)
Physical Therapy Session Note  Patient Details  Name: Alan Tapia. MRN: [id number] Date of Birth: 04/24/1976  Today's Date: 08/27/2022 PT Individual Time: 1136-1208 and 1335-1500 PT Individual Time Calculation (min): 32 min and 85 min  Short Term Goals: Week 1:  PT Short Term Goal 1 (Week 1): Pt will transfer sit to stand w/ mod A. PT Short Term Goal 2 (Week 1): Pt will transfer bed<> w/c w/ min A. PT Short Term Goal 3 (Week 1): PT will assess gait.  Skilled Therapeutic Interventions/Progress Updates:Tx1: Pt presented in w/c with wife present agreeable to therapy. Pt c/o pain in residual limb did not rate. No pain behaviors demonstrated throughout session. PTA provided brief edu on monitoring pain with pt explaining that was spasming all night. Pt propelled to ortho gym for endurance and performed lateral scoot transfer to high/low mat. Pt was able to manage leg rests independently but required some assistance for arm rest management (required significant force to remove from PTA). At mat pt transferred to supine and performed SLR and hip abd/add 3 x 10 on residual limb. Pt with increased pain with therex but improved with rest between bouts. Pt then returned to sitting EOB and performed LAQ with residual limb 3 x 10. Pt was able to don lib guard with residual limb hanging off mat with minA. Performed lateral scoot transfer to w/c with CGA but moderate increased effort noted. Pt proplled back to room at end of session with supervision and remained in w/c with call bell within reach and current needs met.   Tx2: Pt presented in w/c ready for therapy. Pt c/o pain but did not rate. Rest and repositioning as well as adjustment to limb guard provided during session. Pt transported to day room for time management and energy conservation. Performed lateral scoot transfer to high/low mat. Provided education to pt regarding improving technique while performing scoot to improve hip clearance. Also  education that this would translate to improved stands as well.  Pt was able to perform lateral scoots L/R with consistent hip clearance until end when mild fatigue and pt more sliding across mat vs clearing mat. Pt then worked on Sit to stand from lowered surface. Pt initially requiring minA and cues for grounding R foot (pt had tendency to move foot prior to attempting stand) as well as incorporate increased anterior lean. Pt was also provided with yoga block which pt was then able to consistently perform stands from lowered surface with light minA with at times CGA. Pt then participated in standing tolerance tasks including picking up and placing horseshoes on basketball net. Pt grasping from table tray to net then picking up with right hand, handing over to L then placing on net. Pt also participated in picking up and placing basketball in net. Pt was able to hand off R to L hand with CGA. While performing this task pt with x 1 LOB requiring assist to return to mat. Pt verbalized understanding that he moved too quickly and foot was not "planted" on ground. Pt was able to resume activity (performing stand with light minA) without any additional LOB. Pt was able to maintain standing performing this task for approx 5 min.  Pt also participated in alternating reaching ball taps with beach ball progressing to crossing midline while maintaining standing x 3 min without LOB. Pt expressed some frustration regarding being able to stand but continues to have difficulty hopping. Discussed technique (therapist deferred attempting ambulation due to safety) with PTA demonstrating  incorporating sequencing pushing down on RW/scapular depression to facilitate "lift" then incorporating hopping. Pt was able to stand and simulate elbow extension/scapular depression x 10 with fair technique. Once pt returned to sitting performed lateral scoot transfer to w/c with CGA and PTA stabilizing w/c. Pt noted this time to clear w/c enough to  not allow hips to catch on cushion. Pt then propelled w/c >211ft to nsg station with supervision and x 2 rest breaks. Pt transported remaining distance back to room. Pt remained in w/c at end of session with call bell within reach and current needs met.      Therapy Documentation Precautions:  Precautions Precautions: Fall, Other (comment) (wound vac) Required Braces or Orthoses: Other Brace Other Brace: L limb guard Restrictions Weight Bearing Restrictions: Yes LLE Weight Bearing: Non weight bearing General:   Vital Signs:  Pain: Pain Assessment Pain Scale: 0-10 Pain Score: 0-No pain Mobility:   Locomotion :    Trunk/Postural Assessment :    Balance:   Exercises:   Other Treatments:      Therapy/Group: Individual Therapy  Jep Dyas 08/27/2022, 1:11 PM

## 2022-08-27 NOTE — Progress Notes (Addendum)
Patient ID: Alan Tapia., male   DOB: 1975/12/14, 47 y.o.   MRN: [id number]  Met with pt and wife regarding team conference goals of supervision-mod/I and target discharge date of 5/1. Both pleased with his progress but still having issues with his meals and snacks. Pt was not happy with his meal today and reports he did not have this issue on the acute floor. He reports they call in his orders and have not seen the dietary person going around taking orders, his lunch was not what he ordered. Dietician to be in the building tomorrow and will see both of them. Education needs to be started around pt's diabetes and what food he can choose and maybe a list be given regarding this. Dietician is aware of this. Discussed trying to his his equipment covered by his insurance which will be valid on 5/1. Will continue to work on discharge needs and hopefully his food will get corrected and a happy medium will be reached. Wife to get this worker the MA number she has for [facility]

## 2022-08-27 NOTE — Progress Notes (Signed)
Physical Therapy Session Note  Patient Details  Name: Alan Tapia. MRN: [id number] Date of Birth: [birth year]  {CHL IP REHAB PT TIME CALCULATION:[id number]}  Short Term Goals: Week 1:  PT Short Term Goal 1 (Week 1): Pt will transfer sit to stand w/ mod A. PT Short Term Goal 2 (Week 1): Pt will transfer bed<> w/c w/ min A. PT Short Term Goal 3 (Week 1): PT will assess gait.  Skilled Therapeutic Interventions/Progress Updates:    Pt seated in WC upon arrival. Pt agreeable to therapy. Pt denies any pain, dizziness, nausea. Pt upset that he has not received breakfast yet, notified nurse tech, dietary in room at end of session for tomorrow's order and brought today's breakfast.   Treatment session focused on improving independence with sit<>stand from [facility] height surface. Pt tranported dependent in [facility] room to mat table. Pt performed lateral scoot transfer with therapist stabilizing WC and providing verbal cues for head hip ratio. Pt performed sit<>stand x12 from mat table (initially with hips higher than knees), but progressed to mat table level with WC height. Pt requiring CGA to stabilize RW with hips higher than knees, and +2 A from level WC to height with tech holding L LE to maintain L LE NWB precautions, and therpaist stabilzing RW and providing CGA to min A for boost up.   Therapist provided seated rest breaks as needed for fatigue/pain as pt reports 6/10 L LE residual pain with standing.   Pt performed 1x10 L seated LAQ and 2x10 seated hip flexion, with verbal cues for upright posture as pt demos trunk extension compensation. Education provided on purpose of exercise, pt verbalized understanding.   Pt transported dependent in [facility] to room. Pt seated in wheelchair with wife in room and all needs within reach at end of session.   Therapy Documentation Precautions:  Precautions Precautions: Fall, Other (comment) (wound vac) Required Braces or Orthoses: Other Brace Other Brace: L limb  guard Restrictions Weight Bearing Restrictions: Yes LLE Weight Bearing: Non weight bearing   Therapy/Group: Individual Therapy  [facility] Ambrose Finland, [place], DPT  08/27/2022, 7:40 AM

## 2022-08-27 NOTE — Patient Care Conference (Signed)
Inpatient RehabilitationTeam Conference and Plan of Care Update Date: 08/27/2022   Time: 12:14 PM    Patient Name: Alan Tapia.      Medical Record Number: [id number]  Date of Birth: [birth year] Sex: Male         Room/Bed: 4M08C/4M08C-01 Payor Info: Payor: MEDICAID OUT OF STATE / Plan: MEDICAID OUT OF STATE / Product Type: *No Product type* /    Admit Date/Time:  08/22/2022  3:18 PM  Primary Diagnosis:  Below-knee amputation of left lower extremity  Hospital Problems: Principal Problem:   Below-knee amputation of left lower extremity Active Problems:   Dilated cardiomyopathy    Expected Discharge Date: Expected Discharge Date: 09/10/22  Team Members Present: Physician leading conference: Dr. Fanny Dance Social Worker Present: Dossie Der, LCSW Nurse Present: Chana Bode, RN PT Present: Other (comment) Ambrose Finland, PT) OT Present: Jake Shark, OT PPS Coordinator present : Fae Pippin, SLP     Current Status/Progress Goal Weekly Team Focus  Bowel/Bladder   Pt is continent of bowel/bladder   Pt will remain continent of bowel/bladder   Will assess qshift and PRN    Swallow/Nutrition/ Hydration               ADL's   set up UB ADLs, min A LB, min A toileting, CGA lateral scoots, min-mod A stand from elevated surface   mod I   ADLs, transfers, endurance, limb loss edu, d/c planning    Mobility   CGA STS from elevated mat table, mod A at // bars, supervision lateral scoot transfer, perform heel raises with RW and CGA   supervision  initiate hopping, initiate car transfer, increase independence with sit<>stand level surface    Communication                Safety/Cognition/ Behavioral Observations               Pain   Pt c/o surgical pain and phantom pain   Pt's pain is controlled with medication   Will assess qshift and PRN    Skin   Pt's BKA incision is healing   Pt's incision will continue to heal  Will assess qshift and PRN       Discharge Planning:  Home with wife who has been staying here to assist and provide emotional support. Many issues-diabetes, insurance, accessibility to home. Working on all of these. Letter given for making home accessible and for wife being out of work. On list for neuro-psych to see may also benefit peer support   Team Discussion: Patient with episode of chest pain, nausea; patient correlates and suspects related to Sherryll Burger; MD discontinued. Patient post left BKA with pain managed with muscle relaxers, prn meds and MD added Neurontin. Discussed option for power wheelchair versus prosthetic.  Patient on target to meet rehab goals: yes, currently needs set up for upper body care and min assist for toileting. Able to complete lateral scoots and hop once up from an elevated surface. Needs min assist to stand from the elevated surface. Goals for discharge set for mod I overall.   *See Care Plan and progress notes for long and short-term goals.   Revisions to Treatment Plan:  Dietician consult HS snacks Cardiology consult   Teaching Needs: Safety, medications, dietary modifications, skin care, transfers, toileting, etc.   Current Barriers to Discharge: Decreased caregiver support, Wound care, and Weight bearing restrictions  Possible Resolutions to Barriers: Family education Will need a ramp for entry to the home SW  follow up with wife for home environmental updates/hand rails, etc. For landlord Post hospital follow up and to establish primary care scheduled for 09/03/2022 at 2:30 pm with Bertram Denver NP      Medical Summary Current Status: L BKA, DM2, chest pain, carediomyopathy, HTN, pain  Barriers to Discharge: Cardiac Complications;Medical stability;Uncontrolled Pain;Weight bearing restrictions  Barriers to Discharge Comments: L BKA, DM2, chest pain, carediomyopathy, HTN, pain Possible Resolutions to [employer] Focus: discuss with cardiology, monitor CBG, monitor BP,  adjust pain medications   Continued Need for Acute Rehabilitation Level of Care: The patient requires daily medical management by a physician with specialized training in physical medicine and rehabilitation for the following reasons: Direction of a multidisciplinary physical rehabilitation program to maximize functional independence : Yes Medical management of patient stability for increased activity during participation in an intensive rehabilitation regime.: Yes Analysis of laboratory values and/or radiology reports with any subsequent need for medication adjustment and/or medical intervention. : Yes   I attest that I was present, lead the team conference, and concur with the assessment and plan of the team.   Chana Bode B 08/27/2022, 2:59 PM

## 2022-08-27 NOTE — Progress Notes (Signed)
Occupational Therapy Session Note  Patient Details  Name: Alan Tapia. MRN: [id number] Date of Birth: 1976-03-30  Today's Date: 08/27/2022 OT Individual Time: 8413-2440 OT Individual Time Calculation (min): 60 min    Short Term Goals: Week 1:  OT Short Term Goal 1 (Week 1): Pt will complete LB dressing with Min A + LRAD. OT Short Term Goal 2 (Week 1): Pt will complete LB bathing with Min A + LRAD. OT Short Term Goal 3 (Week 1): Pt will complete toilet transfer with CGA + LRAD.  Skilled Therapeutic Interventions/Progress Updates:    Pt received sitting with no c/o pain, agreeable to OT session. Session focused on trialing several different body weight harness support systems to attempt to increase pt confidence and safety in attempting hopping with the RW. The maxi sky walking harness did not fit despite several attempts. He completed a lateral scoot transfer to the mat with (S), support required on the wheelchair. He completed several sit <> stands from EOM, initially requiring min-mod A d/t slightly lower mat but then progressing to CGA once mat was significantly elevated for adjustment and fit of the harness. He completed sit > stand with the harness support. He completed one hop forward and shifting of his R foot with pt reporting he just feels very unsecure in the harness and would prefer to attempt with OT. Removed harness and returned to EOM. He completed another stand with CGA and then hopped forward with mod A from OT with the RW. He completed a stand pivot transfer to the mat with min A. He returned to his room and was left sitting up with all needs met.    Therapy Documentation Precautions:  Precautions Precautions: Fall, Other (comment) (wound vac) Required Braces or Orthoses: Other Brace Other Brace: L limb guard Restrictions Weight Bearing Restrictions: Yes LLE Weight Bearing: Non weight bearing  Therapy/Group: Individual Therapy  Crissie Reese 08/27/2022, 6:14 AM

## 2022-08-27 NOTE — Progress Notes (Signed)
Rounding Note    Patient Name: Alan Tapia. Date of Encounter: 08/27/2022  [facility] Cardiologist: None   Subjective   Asked to come back to see patient due to patient getting a dose of Entresto and then developing nausea vomiting and chest pain again.  In review of notes back on 08/21/2022 by cardiology.,  He started developing problems with nausea and vomiting as well as diarrhea and chest pain after starting Entresto.  The patient tells me that Sherryll Burger was held during his hospital stay for 2 days and his symptoms of chest discomfort, nausea, vomiting and diarrhea resolved.  I cannot find any documentation of this and actually it is documented the patient's Sherryll Burger was actually increased to 49-51 mg twice daily and he was discharged to rehab on 412 on carvedilol 12.5 mg twice daily, Lasix 40 mg daily, Entresto 49-51 mg twice daily and spironolactone 25 mg daily.    On 4:13 PM and he refused to take his Entresto and rehab and told the nurse he was supposed to be transition to a different heart medicine but there was no documentation of this.  Apparently yesterday morning patient got another dose of Entresto at 6 AM and started developing nausea vomiting and diarrhea and reported that he had been feeling this daily since he started the [facility] usually about 20 minutes after he takes it.  He has not been given any further Entresto and has not had any further symptoms.  He is now refusing to take any further Entresto and wants to hold off until he is seen back by Dr. Shari Prows in the office  Inpatient Medications    Scheduled Meds:  acetaminophen  650 mg Oral TID   vitamin C  1,000 mg Oral Daily   enoxaparin (LOVENOX) injection  75 mg Subcutaneous Q24H   furosemide  40 mg Oral Daily   gabapentin  100 mg Oral TID   hydrocerin   Topical BID   insulin aspart  0-5 Units Subcutaneous QHS   insulin aspart  0-9 Units Subcutaneous TID WC   insulin glargine-yfgn  14 Units  Subcutaneous Daily   melatonin  5 mg Oral QHS   metoprolol tartrate  25 mg Oral BID   multivitamin with minerals  1 tablet Oral Daily   nutrition supplement (JUVEN)  1 packet Oral BID BM   polyethylene glycol  17 g Oral Daily   spironolactone  25 mg Oral Daily   zinc sulfate  220 mg Oral Daily   Continuous Infusions:   PRN Meds: acetaminophen, alum & mag hydroxide-simeth, bisacodyl, guaiFENesin-dextromethorphan, hydrocortisone cream, methocarbamol, oxyCODONE, phenol, polyethylene glycol, prochlorperazine **OR** prochlorperazine **OR** prochlorperazine, sodium phosphate, traMADol, traZODone   Vital Signs    Vitals:   08/27/22 0024 08/27/22 0630 08/27/22 1516 08/27/22 1933  BP: 133/74 (!) 165/86 (!) 151/96 (!) 160/89  Pulse: 81 85 94 91  Resp: Temp: 98 F (36.7 C) 98.8 F (37.1 C) 98 F (36.7 C) 98.8 F (37.1 C)  TempSrc: Oral Oral Oral Oral  SpO2: 98% 100% 100% 94%  Weight:  (!) 151.5 kg    Height:        Intake/Output Summary (Last 24 hours) at 08/27/2022 2207 Last data filed at 08/27/2022 1356 Gross per 24 hour  Intake 236 ml  Output 800 ml  Net -564 ml       08/27/2022    6:30 AM 08/26/2022    5:00 AM 08/25/2022    5:11 AM  Last 3 Weights  Weight (lbs) 334 lb 337 lb 4.9 oz 329 lb 9.4 oz  Weight (kg) 151.5 kg 153 kg 149.5 kg      Telemetry    Currently not on telemetry- Personally Reviewed  ECG    EKG from 08/26/2022 demonstrates normal sinus rhythm with no ST changes personally Reviewed  Physical Exam   GEN: Well nourished, well developed in no acute distress HEENT: Normal NECK: No JVD; No carotid bruits LYMPHATICS: No lymphadenopathy CARDIAC:RRR, no murmurs, rubs, gallops RESPIRATORY:  Clear to auscultation without rales, wheezing or rhonchi  ABDOMEN: Soft, non-tender, non-distended MUSCULOSKELETAL:  No edema; left BKA SKIN: Warm and dry NEUROLOGIC:  Alert and oriented x 3 PSYCHIATRIC:  Normal affect  Labs    High Sensitivity  Troponin:   Recent Labs  Lab 08/26/22 0926 08/26/22 1142 08/26/22 1431  TROPONINIHS Chemistry Recent Labs  Lab 08/22/22 0236 08/23/22 0818 08/25/22 0648  NA 137 134* 137  K 3.9 3.7 3.8  CL 106 101 104  CO2 GLUCOSE 174* 181* 173*  BUN CREATININE 0.94 1.03 1.11  CALCIUM 7.8* 7.7* 7.8*  PROT  --  5.9*  --   ALBUMIN  --  <1.5*  --   AST  --  11*  --   ALT  --  9  --   ALKPHOS  --  116  --   BILITOT  --  0.4  --   GFRNONAA >60 >60 >60  ANIONGAP Lipids No results for input(s): "CHOL", "TRIG", "HDL", "LABVLDL", "LDLCALC", "CHOLHDL" in the last 168 hours.  Hematology Recent Labs  Lab 08/23/22 0818 08/25/22 0648  WBC 7.2 7.0  RBC 3.97* 3.99*  HGB 9.2* 9.2*  HCT 30.1* 31.0*  MCV 75.8* 77.7*  MCH 23.2* 23.1*  MCHC 30.6 29.7*  RDW 18.3* 19.0*  PLT 375 396    Thyroid No results for input(s): "TSH", "FREET4" in the last 168 hours.  BNPNo results for input(s): "BNP", "PROBNP" in the last 168 hours.  DDimer No results for input(s): "DDIMER" in the last 168 hours.   Radiology    No results found.  Cardiac Studies   Echo from 08/17/22:     1. Left ventricular ejection fraction, by estimation, is 35 to 40%. The  left ventricle has moderately decreased function. The left ventricle  demonstrates global hypokinesis. The left ventricular internal cavity size  was mildly dilated. Indeterminate  diastolic filling due to E-A fusion.   2. Right ventricular systolic function is normal. The right ventricular  size is normal. Tricuspid regurgitation signal is inadequate for assessing  PA pressure.   3. Left atrial size was mildly dilated.   4. The mitral valve is grossly normal. Mild mitral valve regurgitation.   5. The aortic valve is tricuspid. Aortic valve regurgitation is not  visualized.   6. The inferior vena cava is dilated in size with <50% respiratory  variability, suggesting right atrial pressure of 15 mmHg.   Patient Profile      47 y.o. male  with history of poorly controlled DMII, uncontrolled HTN, lymphedema, chronic left foot diabetic ulcer, morbid obesity and medication noncompliance who presented with worsening left lower extremity ulcer swelling and draining found to have osteomyelitis/septic arthritis and ultimately underwent BKA. TTE this admission that was obtained for LE edema showed EF 35-40% global hypokinesis, mild LV dilation, indeterminate diastolic parameters, normal RV, mild  LAE, mild MR. Cardiology is now consulted for newly diagnosed systolic HF.   Assessment & Plan    #Newly Diagnosed Acute Systolic HF: -TTE this admission with LVEF 35-40% from 50-55% in 12/2021 -Normal EKG but patient at high risk for CAD  -May also be related to uncontrolled HTN or stress induced CM in the setting of sepsis -Will continue to optimize GDMT as tolerated with plans for possible ischemic work-up as outpatient if EF remains depressed -He has had some episodes of nausea, vomiting, chest pain, diarrhea that it come on about 20 minutes after he says he takes Entresto and has been occurring daily with every dose.  He has no symptoms of chest pain when he is exerting himself at rehab it is only after he gets his Entresto dose.  He therefore has refused to take any further Entresto until he is seen back by Dr. Shari Prows in the office and refuses to try an ARB as well.   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

## 2022-08-27 NOTE — Progress Notes (Signed)
Social worker alerted yesterday to patient concern.Patient concerned that he has not been receiving a hs snack & that he was not receiving double proteins. He does have an order for double proteins on his meal trays. Dietary confirmed the order & he stated that . Will address it it happens again. Social worker stated that she would reach out to the dietician. Today, asked patient if he had not received a snack last night, area was checked & staff interviewed. He had not & had not seen the dietician yet. Dietary consult placed to address concern.

## 2022-08-27 NOTE — Progress Notes (Signed)
PROGRESS NOTE   Subjective/Complaints:  Chest pain has resolved. He continues to have residual limb and and phantom pain. Pain medications help but sometimes dont last long enough.   ROS: +insomnia-improved, + residual limb pain, +phantom pain-controlled, +constipation. Denies fevers, chills,  SOB, abd pain, /D, new/worsening paresthesias/weakness, or any other complaints at this time.    CP and nausea -resolved this AM  Objective:   No results found. Recent Labs    08/25/22 0648  WBC 7.0  HGB 9.2*  HCT 31.0*  PLT 396    Recent Labs    08/25/22 0648  NA 137  K 3.8  CL 104  CO2 25  GLUCOSE 173*  BUN 8  CREATININE 1.11  CALCIUM 7.8*     Intake/Output Summary (Last 24 hours) at 08/27/2022 1109 Last data filed at 08/26/2022 1916 Gross per 24 hour  Intake 1076 ml  Output --  Net 1076 ml         Physical Exam: Vital Signs Blood pressure (!) 165/86, pulse 85, temperature 98.8 F (37.1 C), temperature source Oral, resp. rate 18, height  (1.93 m), weight (!) 151.5 kg, SpO2 100 %.  Constitutional: No distress . Vital signs reviewed. Sitting in chair, appears comfortable HEENT: NCAT, EOMI, oral membranes moist Neck: supple Cardiovascular: RRR without murmur. No JVD    Respiratory/Chest: CTA Bilaterally without wheezes or rales. Normal effort    GI/Abdomen: BS +, non-tender, non-distended Ext: no clubbing or cyanosis Psych: pleasant and cooperative Skin: dry, warm. Chronic changes distal RLE d/t lymphedema, trace to 1+ edema noted today. No sign of breakdown. Left BKA with vac/dressing. No significant drainage in vac MSK: Chronic lymphedema RLE. LLE in Vac/dressing with shrinker over top   PRIOR EXAMS:  Neurologic: Alert and oriented x 3. Normal insight and awareness. Intact Memory. Normal language and speech. Cranial nerve exam unremarkable. MMT: UE 5/5 prox to distal. RLE 4/5 prox to distal. LLE: 2/5  HF. Sensory exam appears grossly normal for light touch and pain in all 4 limbs. No limb ataxia or cerebellar signs. No abnormal tone appreciated.     Assessment/Plan: 1. Functional deficits which require 3+ hours per day of interdisciplinary therapy in a comprehensive inpatient rehab setting. Physiatrist is providing close team supervision and 24 hour management of active medical problems listed below. Physiatrist and rehab team continue to assess barriers to discharge/monitor patient progress toward functional and medical goals  Care Tool:  Bathing    Body parts bathed by patient: Right arm, Left arm, Chest, Abdomen, Front perineal area, Face, Right upper leg, Left upper leg   Body parts bathed by helper: Buttocks, Right lower leg Body parts n/a: Left lower leg   Bathing assist Assist Level: Minimal Assistance - Patient > 75%     Upper Body Dressing/Undressing Upper body dressing   What is the patient wearing?: Pull over shirt    Upper body assist Assist Level: Set up assist    Lower Body Dressing/Undressing Lower body dressing      What is the patient wearing?: Pants     Lower body assist Assist for lower body dressing: Moderate Assistance - Patient 50 - 74%  Toileting Toileting Toileting Activity did not occur Press photographer and hygiene only): N/A (no void or bm)  Toileting assist Assist for toileting: Dependent - Patient 0%     Transfers Chair/bed transfer  Transfers assist     Chair/bed transfer assist level: Supervision/Verbal cueing (lateral scoot transfer)     Locomotion Ambulation   Ambulation assist   Ambulation activity did not occur: Safety/medical concerns          Walk 10 feet activity   Assist  Walk 10 feet activity did not occur: Safety/medical concerns        Walk 50 feet activity   Assist Walk 50 feet with 2 turns activity did not occur: Safety/medical concerns         Walk 150 feet activity   Assist Walk  150 feet activity did not occur: Safety/medical concerns         Walk 10 feet on uneven surface  activity   Assist Walk 10 feet on uneven surfaces activity did not occur: Safety/medical concerns         Wheelchair     Assist Is the patient using a wheelchair?: Yes Type of Wheelchair: Manual    Wheelchair assist level: Supervision/Verbal cueing Max wheelchair distance: 100    Wheelchair 50 feet with 2 turns activity    Assist        Assist Level: Supervision/Verbal cueing   Wheelchair 150 feet activity     Assist      Assist Level: Supervision/Verbal cueing   Blood pressure (!) 165/86, pulse 85, temperature 98.8 F (37.1 C), temperature source Oral, resp. rate 18, height 6\' 4"  (1.93 m), weight (!) 151.5 kg, SpO2 100 %.  Medical Problem List and Plan: 1. 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 Formulized individual treatment plan based on medical history, underlying problem and comorbidities.   2.  Antithrombotics: -DVT/anticoagulation:  Pharmaceutical: Lovenox 80mg  QD             -antiplatelet therapy: N/A 3. Pain Management: Tylenol 650mg  TID, Oxycodone 5-10mg  q4h prn. Tramadol 50mg  q6h PRN.              -add robaxin 500mg  q6h prn for spasms.  -08/23/22 having phantom pain interfering with sleep, will add gabapentin 100mg  TID for now, monitor for effect/side effects.  4/15 reports pain controlled -4/17 increase gabapentin to  200mg  TID Increase robaxin dose to 750mg  PRN  4. Mood/Behavior/Sleep: LCSW to follow for evaluation and support.              -antipsychotic agents: N/A -08/23/22 poor sleep, ordered melatonin 5mg  QHS,  also has trazodone PRN--improved 08/24/22  5. Neuropsych/cognition: This patient is capable of making decisions on his own behalf. 6. Skin/Wound Care: Monitor wound for healing             --limb protector when up -he's post-op day #7. Spoke with Dr Lajoyce Corners who recommended changing vac to prevena and continuing for another week.  -consider DC vac 4/21 7. Fluids/Electrolytes/Nutrition: Monitor I/O. Continue Vitamin C and Zinc and MVI. Monitor routine Monday labs.              --  add juven BID additionally -08/23/22 BMP with mild hyponatremia 134, otherwise fairly unremarkable, monitor on routine Monday labs -4/15 Na up to 137, improved 8. T2DM: Hgb A1c- 10.3 and poorly controlled. --dietician has added double portion as well as snacks TID to help with hunger/apptite             --monitor BS ac/hs and titrate insulin as indicated             --continue Insulin glargine 14 units QD and SSI  -4/16 fair control, continue current regimen for now  4/17 monitor for now, conisder increase glargine  CBG (last 3)  Recent Labs    08/26/22 1618 08/26/22 2110 08/27/22 0632  GLUCAP 230* 190* 140*     9. Dilated CM of unclear etiology: On Lasix  QD, Entresto 49-51mg  BID (dose increased today) and spironolactone  QD             --will add low salt restrictions. Monitor daily weights --add low dose KCL due to recurrent hypokalemia requiring intermittent supplementation --recheck BMET 04/13 and on 04/15 as on spiro -pt is having nausea and ?diarrhea with coreg increase. Will convert him to metoprolol  bid to start--->titrate as needed. -08/23/22 K+ 3.7; pt educated on med changes and importance of compliance. No weight done today, ordered daily weights.  -08/24/22 no wt again, asked nursing to please make sure daily wt done; slight increase in RLE swelling noted, but mild, monitor for now -4/16 Will contact cardiology regarding DC of entresto -4/17 Will contact cardiology regarding different  medication  Filed Weights   08/25/22 0511 08/26/22 0500 08/27/22 0630  Weight: (!) 149.5 kg (!) 153 kg (!) 151.5 kg    10. HTN: Monitor BP TID--now on Lasix, Entresto and Spironolactone.             -metoprolol as above  -08/23/22 BPs elevated but recent change to meds, monitor for now  -4/15 mildly elevated, monitor for now  -4/16 Entresto DC, will consider additional medication if BP remains elevated, will discuss with cardiology   -4/17 Cardiology notified, will contract cardiology again for possible different medication, cardiology plans to discuss with   Vitals:   08/24/22 1932 08/25/22 0300 08/25/22 1502 08/25/22 1938  BP: (!) 163/86 (!) 146/76 (!) 154/87 (!) 150/76   08/26/22 0617 08/26/22 0734 08/26/22 1137 08/26/22 1434  BP: (!) 157/84 (!) 172/92 (!) 152/85 (!) 160/86   08/26/22 1847 08/26/22 2020 08/27/22 0024 08/27/22 0630  BP: (!) 152/73 (!) 173/85 133/74 (!) 165/86    11. Morbid obesity: Continue to educate on diet, exercise and compliance to help promote overall health and mobility.    12. Anemia:   -08/23/22 HGb 9.2, relatively stable; monitor on routine labs  -4/15 HGB stable at 9.2 13. Constipation: usually goes daily at home  -08/24/22 no BM in 2 days, will start miralax 17g QD for now; monitor  -4/17 BM yesterday 14.  Chest pain and nausea, improving  -Check EKG-NSR, Toponin- 4  -Pt reports this is related to his use of entresto, will DC this medication, He says he is feeling better now  -No CP today, reports he feels better with entresto stopped  LOS: 5 days A FACE TO FACE EVALUATION WAS PERFORMED  Fanny Dance 08/27/2022, 11:09 AM

## 2022-08-28 DIAGNOSIS — S88112D Complete traumatic amputation at level between knee and ankle, left lower leg, subsequent encounter: Secondary | ICD-10-CM | POA: Diagnosis not present

## 2022-08-28 DIAGNOSIS — I10 Essential (primary) hypertension: Secondary | ICD-10-CM | POA: Diagnosis not present

## 2022-08-28 DIAGNOSIS — E1165 Type 2 diabetes mellitus with hyperglycemia: Secondary | ICD-10-CM | POA: Diagnosis not present

## 2022-08-28 DIAGNOSIS — I420 Dilated cardiomyopathy: Secondary | ICD-10-CM | POA: Diagnosis not present

## 2022-08-28 LAB — GLUCOSE, CAPILLARY
Glucose-Capillary: 89 mg/dL (ref 70–99)
Glucose-Capillary: 104 mg/dL — ABNORMAL HIGH (ref 70–99)
Glucose-Capillary: 106 mg/dL — ABNORMAL HIGH (ref 70–99)
Glucose-Capillary: 129 mg/dL — ABNORMAL HIGH (ref 70–99)

## 2022-08-28 MED ORDER — METOPROLOL TARTRATE 50 MG PO TABS
50.0000 mg | ORAL_TABLET | Freq: Two times a day (BID) | ORAL | Status: DC
  Administered 2022-08-28 – 2022-09-01 (×8): 50 mg via ORAL
  Filled 2022-08-28 (×8): qty 1

## 2022-08-28 NOTE — Progress Notes (Signed)
Nutrition Quick Note:   MD consult for pt wanting snacks. RD went to talk with pt about what snacks he would like. Pt states that he has been having difficulty with receiving the right foods and not getting snacks in-between meals. RD has updated pt's status in [facility] to be a manager's check. RD will also add scheduled snacks in HT as well. Will continue to monitor PO intakes.   Bethann Humble, RD, LDN, CNSC.

## 2022-08-28 NOTE — Progress Notes (Signed)
PROGRESS NOTE   Subjective/Complaints: Seen by cardiology yesterday- appreciate assistance. No new concerns this AM.  Reports muscle spasms and pain doing better today.   ROS: +insomnia-improved, + residual limb pain, +phantom pain-controlled, +constipation. Denies fevers, chills,  SOB, abd pain, /D, new/worsening paresthesias/weakness, or any other complaints at this time.    CP and nausea -resolved this AM  Objective:   No results found. No results for input(s): "WBC", "HGB", "HCT", "PLT" in the last 72 hours.  No results for input(s): "NA", "K", "CL", "CO2", "GLUCOSE", "BUN", "CREATININE", "CALCIUM" in the last 72 hours.   Intake/Output Summary (Last 24 hours) at 08/28/2022 0821 Last data filed at 08/27/2022 1356 Gross per 24 hour  Intake 236 ml  Output 800 ml  Net -564 ml         Physical Exam: Vital Signs Blood pressure (!) 142/79, pulse 80, temperature 98.2 F (36.8 C), temperature source Oral, resp. rate 16, height  (1.93 m), weight (!) 151.7 kg, SpO2 98 %.  Constitutional: No distress . Vital signs reviewed. Sitting in chair, appears comfortable HEENT: NCAT, EOMI, oral membranes moist Neck: supple Cardiovascular: RRR without murmur. No JVD    Respiratory/Chest: CTA Bilaterally without wheezes or rales. Normal effort    GI/Abdomen: BS normoactive, NT, ND Ext: no clubbing or cyanosis Psych: pleasant and cooperative Skin: dry, warm. Chronic changes distal RLE d/t lymphedema, trace to 1+ edema noted today. No sign of breakdown. Left BKA with vac/dressing. No significant drainage in vac MSK: Chronic lymphedema RLE. LLE in Vac/dressing with shrinker over top  Neuro: Sensation LT intact all 4 extremities  PRIOR EXAMS:  Neurologic: Alert and oriented x 3. Normal insight and awareness. Intact Memory. Normal language and speech. Cranial nerve exam unremarkable. MMT: UE 5/5 prox to distal. RLE 4/5 prox to distal.  LLE: 2/5 HF. Sensory exam appears grossly normal for light touch and pain in all 4 limbs. No limb ataxia or cerebellar signs. No abnormal tone appreciated.     Assessment/Plan: 1. Functional deficits which require 3+ hours per day of interdisciplinary therapy in a comprehensive inpatient rehab setting. Physiatrist is providing close team supervision and 24 hour management of active medical problems listed below. Physiatrist and rehab team continue to assess barriers to discharge/monitor patient progress toward functional and medical goals  Care Tool:  Bathing    Body parts bathed by patient: Right arm, Left arm, Chest, Abdomen, Front perineal area, Face, Right upper leg, Left upper leg   Body parts bathed by helper: Buttocks, Right lower leg Body parts n/a: Left lower leg   Bathing assist Assist Level: Minimal Assistance - Patient > 75%     Upper Body Dressing/Undressing Upper body dressing   What is the patient wearing?: Pull over shirt    Upper body assist Assist Level: Set up assist    Lower Body Dressing/Undressing Lower body dressing      What is the patient wearing?: Pants     Lower body assist Assist for lower body dressing: Moderate Assistance - Patient 50 - 74%     Toileting Toileting Toileting Activity did not occur (Clothing management and hygiene only): N/A (no void or bm)  Toileting assist Assist for toileting: Dependent - Patient 0%     Transfers Chair/bed transfer  Transfers assist     Chair/bed transfer assist level: Supervision/Verbal cueing (lateral scoot transfer)     Locomotion Ambulation   Ambulation assist   Ambulation activity did not occur: Safety/medical concerns          Walk 10 feet activity   Assist  Walk 10 feet activity did not occur: Safety/medical concerns        Walk 50 feet activity   Assist Walk 50 feet with 2 turns activity did not occur: Safety/medical concerns         Walk 150 feet  activity   Assist Walk 150 feet activity did not occur: Safety/medical concerns         Walk 10 feet on uneven surface  activity   Assist Walk 10 feet on uneven surfaces activity did not occur: Safety/medical concerns         Wheelchair     Assist Is the patient using a wheelchair?: Yes Type of Wheelchair: Manual    Wheelchair assist level: Supervision/Verbal cueing Max wheelchair distance: 100    Wheelchair 50 feet with 2 turns activity    Assist        Assist Level: Supervision/Verbal cueing   Wheelchair 150 feet activity     Assist      Assist Level: Supervision/Verbal cueing   Blood pressure (!) 142/79, pulse 80, temperature 98.2 F (36.8 C), temperature source Oral, resp. rate 16, height  (1.93 m), weight (!) 151.7 kg, SpO2 98 %.  Medical Problem List and Plan: 1. 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  Antithrombotics: -DVT/anticoagulation:  Pharmaceutical: Lovenox  QD             -antiplatelet therapy: N/A 3. Pain Management: Tylenol  TID, Oxycodone 5-10mg  q4h prn. Tramadol  q6h PRN.              -add robaxin  q6h prn for spasms.  -08/23/22 having phantom pain interfering with sleep, will add gabapentin  TID for now, monitor for effect/side effects.  4/15 reports pain controlled -4/17 increase gabapentin to   TID Increase robaxin dose to  PRN -4/18 pt reports pain doing better today, was able to sleep better. Continue current medications  4. Mood/Behavior/Sleep: LCSW to follow for evaluation and support.              -antipsychotic agents: N/A -08/23/22 poor sleep, ordered melatonin  QHS, also has trazodone PRN--improved 08/24/22  5. Neuropsych/cognition: This patient is capable of  making decisions on his own behalf. 6. Skin/Wound Care: Monitor wound for healing             --limb protector when up -he's post-op day #7. Spoke with Dr Lajoyce Corners who recommended changing vac to prevena and continuing for another week.  -consider DC vac 4/21 7. Fluids/Electrolytes/Nutrition: Monitor I/O. Continue Vitamin C and Zinc and MVI. Monitor routine Monday labs.              --add juven BID additionally -08/23/22 BMP with mild hyponatremia 134, otherwise fairly unremarkable, monitor on routine Monday labs -4/15 Na up to 137, improved  8. T2DM: Hgb A1c- 10.3 and poorly controlled. --dietician has added double portion as well as snacks TID to help with hunger/apptite             --monitor BS ac/hs and titrate insulin as indicated             --continue Insulin glargine 14 units QD and SSI  -4/16 fair control, continue current regimen for now  4/17 monitor for now, conisder increase glargine   4/18 well controlled today, continue to monitor CBG (last 3)  Recent Labs    08/27/22 1636 08/27/22 2235 08/28/22 0607  GLUCAP 106* 119* 89     9. Dilated CM of unclear etiology: On Lasix 40mg  QD, Entresto 49-51mg  BID (dose increased today) and spironolactone 25mg  QD             --will add low salt restrictions. Monitor daily weights --add low dose KCL due to recurrent hypokalemia requiring intermittent supplementation --recheck BMET 04/13 and on 04/15 as on spiro -pt is having nausea and ?diarrhea with coreg increase. Will convert him to metoprolol 25mg  bid to start--->titrate as needed. -08/23/22 K+ 3.7; pt educated on med changes and importance of compliance. No weight done today, ordered daily weights.  -08/24/22 no wt again, asked nursing to please make sure daily wt done; slight increase in RLE swelling noted, but mild, monitor for now -4/16 Will contact cardiology regarding DC of entresto -4/18 Seen by cardiology appreciate assistance, consider increase lopressor  Filed Weights    08/26/22 0500 08/27/22 0630 08/28/22 0455  Weight: (!) 153 kg (!) 151.5 kg (!) 151.7 kg    10. HTN: Monitor BP TID--now on Lasix, Entresto and Spironolactone.             -metoprolol as above  -08/23/22 BPs elevated but recent change to meds, monitor for now  -4/15 mildly elevated, monitor for now  -4/16 Entresto DC, will consider additional medication if BP remains elevated, will discuss with cardiology   -4/17 Cardiology notified, will contract cardiology again for possible different medication, cardiology plans to discuss with   -4/18 increase lopressor dose to 50mg  daily  Vitals:   08/25/22 1938 08/26/22 0617 08/26/22 0734 08/26/22 1137  BP: (!) 150/76 (!) 157/84 (!) 172/92 (!) 152/85   08/26/22 1434 08/26/22 1847 08/26/22 2020 08/27/22 0024  BP: (!) 160/86 (!) 152/73 (!) 173/85 133/74   08/27/22 0630 08/27/22 1516 08/27/22 1933 08/28/22 0455  BP: (!) 165/86 (!) 151/96 (!) 160/89 (!) 142/79    11. Morbid obesity: Continue to educate on diet, exercise and compliance to help promote overall health and mobility.    12. Anemia:   -08/23/22 HGb 9.2, relatively stable; monitor on routine labs  -4/15 HGB stable at 9.2 13. Constipation: usually goes daily at home  -08/24/22 no BM in 2 days, will start miralax 17g QD for now; monitor  -4/18 LBM 4/16 consider additional medication if no BM today 14.  Chest pain and nausea, improving  -Check EKG-NSR, Toponin- negative x3  -Pt reports this is related to his use of entresto, will DC this medication, He says he is feeling better now  -Reports no further chest pain,  LOS: 6 days A FACE TO FACE EVALUATION WAS PERFORMED  Fanny Dance 08/28/2022, 8:21 AM

## 2022-08-28 NOTE — Progress Notes (Addendum)
Follow up arranged with Dr Shari Prows on 09/24/22  per Dr Mayford Knife request, see AVS

## 2022-08-28 NOTE — Progress Notes (Signed)
Occupational Therapy Session Note  Patient Details  Alan Marchettaaxwell Jr. MRN: [id number] Date of Birth: August 12, 1975  Today's Date: 08/28/2022 OT Individual Time: 1330-1430 OT Individual Time Calculation (min): 60 min    Short Term Goals: Week 1:  OT Short Term Goal 1 (Week 1): Pt will complete LB dressing with Min A + LRAD. OT Short Term Goal 2 (Week 1): Pt will complete LB bathing with Min A + LRAD. OT Short Term Goal 3 (Week 1): Pt will complete toilet transfer with CGA + LRAD.  Skilled Therapeutic Interventions/Progress Updates:     Pt received in bed with no pain reported  Therapeutic activity OT threads theraband on B wheels to improve grip. Pt then able to more effectively propel w.c with less slipping.   Pt completes Wii bowling in seated position with min cuing for remote use/game strategy. Activity completed to work on dynamic balance needed for BADLs and functional transfers.  Sit to stands completed from EOM 27-22 inches from the ground. Pt completes with MIN A overall with steadying of RW and cuing to keep RLE underneath him. Pt uses yoga block under RUE to push from mat.  Pt completes bean bag toss in standing position with RW AD for balance and MIN A A overall. Activity performed to improve dynamic balance and functional reach in min ranges outside BOS in prep for BADLs/IADLs.  Pt left at end of session in bed with exit alarm on, call light in reach and all needs met   Therapy Documentation Precautions:  Precautions Precautions: Fall, Other (comment) (wound vac) Required Braces or Orthoses: Other Brace Other Brace: L limb guard Restrictions Weight Bearing Restrictions: Yes LLE Weight Bearing: Non weight bearing General:    Therapy/Group: Individual Therapy  Shon Hale 08/28/2022, 6:58 AM

## 2022-08-28 NOTE — Progress Notes (Signed)
Physical Therapy Session Note  Patient Details  Name: Alan Tapia. MRN: [id number] Date of Birth: Jan 26, 1976  Today's Date: 08/28/2022 PT Individual Time: 0802-0902 PT Individual Time Calculation (min): 60 min  PT Individual Time: 9629-5284 PT Individual Time Calculation (min): 40 min   Short Term Goals: Week 1:  PT Short Term Goal 1 (Week 1): Pt will transfer sit to stand w/ mod A. PT Short Term Goal 2 (Week 1): Pt will transfer bed<> w/c w/ min A. PT Short Term Goal 3 (Week 1): PT will assess gait.  Skilled Therapeutic Interventions/Progress Updates:  AM Session:   Patient received in room resting in [facility] with spouse at bedside. Pt reporting fatigued but agreeable to therapy. Patient self propelled with bil UE to main gym, ~180' with supervision. Pt taking 2 short rest breaks and allowing WC to coast between propulsion for energy conservation. Pt completed lateral scoot transfer WC>EOM with Supervision. Seated and supine therapeutic exercise for Bil LE strengthening.  Supine TE: - SLR; 3x10 reps bil LE   cue for Rt LE to complete quad set prior to lift due to extensor lag secondary to weakness/stiffness - hip extension in sidelying - hip abduction in sidelying - cues to lift "up and back" for gluteus medius targeting - bridge (sound side) 3x 10  Seated TE: - LAQ; 3x10 reps bil LE - Triceps press ups on 4" aerobic step for UE placement, 3x10 reps pt reports mild Rt shoulder discomfort  Pt completed lateral scoot EOM>WC with set up assist and CGA for lateral scoot transfers. Pt dependently rolled back to room for energy conservation and time management. EOS pt remained in [facility] and Alarm on and call bell within reach. Family at bedside and all needs met.  PM Session: Patient received in day room gym seated on EOM. Session focused on standing balance challenges. EOM table elevated some to improve power up and pt used yoga block for Rt UE to press up to initiate sit<>stand with Mod  assist to stabilize RW and guide Lt hip up for full stand. Connect 4 played through 3 games with pt alternating use of Rt/Lt UE to drop coin in slot. Pt used Rt UE 75% of the time due to decreased trust with removing Lt UE from RW for support. Seated rest provided between each set. Pt completed standing pinch pin removal from TB at top of mirror (green). Pt removed all pins with Rt UE and placed on table, then replaced all pins back to TB on mirror. Pt more fatigued and seated rest provided after activity. Pt expressed that it was more challenging to perform pinch pin activity due to the mirror providing true visual feedback that he no longer has his Lt leg and states it made him trust his balance slightly less. Discussed that it is important to visualize and see that his lower leg is no longer there to develop neuromotor pathways and recognize it for safety. Pt then transferred EOM to [facility] with CGA for lateral scoot. EOS pt remained in [facility], RN present to provide pain medication and Alarm on and call bell within reach.   Therapy Documentation Precautions:  Precautions Precautions: Fall, Other (comment) (wound vac) Required Braces or Orthoses: Other Brace Other Brace: L limb guard Restrictions Weight Bearing Restrictions: Yes LLE Weight Bearing: Non weight bearing Pain:  Pt reporting residual limb pain and phantom sensation/pain. Pt reports a surgical pain at base of limb that radiates up the side. Pt did not rate throughout session.  Therapy/Group: Individual Therapy    Wynn Maudlin, DPT Acute Rehabilitation Services Office [phone]  08/28/22 3:57 PM

## 2022-08-28 NOTE — Progress Notes (Signed)
Physical Therapy Session Note  Patient Details  Name: Alan Tapia. MRN: [id number] Date of Birth: 03/23/1976  Today's Date: 08/28/2022 PT Individual Time: 6295-2841 PT Individual Time Calculation (min): 45 min   Short Term Goals: Week 1:  PT Short Term Goal 1 (Week 1): Pt will transfer sit to stand w/ mod A. PT Short Term Goal 2 (Week 1): Pt will transfer bed<> w/c w/ min A. PT Short Term Goal 3 (Week 1): PT will assess gait.  Skilled Therapeutic Interventions/Progress Updates:     Pt seated in WC upon arrival. Pt agreeable to therapy, and requesting to use bathroom. Pt reports 8/10 pain, premedicated. Pt peformed lateral scoot transfer WC to bed, bed (level to height of drop arm commode) to drop arm commode, commode<>bed, and bed<> WC with supervision and therapist stabilizing drop arm commode, verbal cues provided for technique and safety. Pt donned/doffed pants while sitting EOB performing lateral trunk leans with supervision. Pt continent of bladder, pt performed pericare with supervision. Pt transported dependent in [facility] for time/energy conservation.   Pt performed lateral scoot transfer WC <> mat table with supervision with therapist stabilizing WC. Marland Kitchen Pt performed sit<>stand  x5 from elevated mat table (hips slightly higher than knees) with therapist stabilizing RW. Pt stood in front of mat table with RW, and CGA for stability, while passing basketball between hands x15 with intermittent use of UE on RW. Pt performed forward reaching to place basketball into goal x10 with CGA for stability.   Pt transported dependent in [facility] to room. Pt seated in [facility] with all needs within reach at end of session.   Therapy Documentation Precautions:  Precautions Precautions: Fall, Other (comment) (wound vac) Required Braces or Orthoses: Other Brace Other Brace: L limb guard Restrictions Weight Bearing Restrictions: Yes LLE Weight Bearing: Non weight bearing  Therapy/Group: Individual  Therapy  [facility]rose Finland, Olga, DPT  08/28/2022, 7:52 AM

## 2022-08-29 DIAGNOSIS — S88112D Complete traumatic amputation at level between knee and ankle, left lower leg, subsequent encounter: Secondary | ICD-10-CM | POA: Diagnosis not present

## 2022-08-29 DIAGNOSIS — I10 Essential (primary) hypertension: Secondary | ICD-10-CM | POA: Diagnosis not present

## 2022-08-29 DIAGNOSIS — I420 Dilated cardiomyopathy: Secondary | ICD-10-CM | POA: Diagnosis not present

## 2022-08-29 DIAGNOSIS — S88112S Complete traumatic amputation at level between knee and ankle, left lower leg, sequela: Secondary | ICD-10-CM

## 2022-08-29 DIAGNOSIS — F4320 Adjustment disorder, unspecified: Secondary | ICD-10-CM

## 2022-08-29 DIAGNOSIS — E1165 Type 2 diabetes mellitus with hyperglycemia: Secondary | ICD-10-CM | POA: Diagnosis not present

## 2022-08-29 LAB — GLUCOSE, CAPILLARY
Glucose-Capillary: 115 mg/dL — ABNORMAL HIGH (ref 70–99)
Glucose-Capillary: 106 mg/dL — ABNORMAL HIGH (ref 70–99)
Glucose-Capillary: 133 mg/dL — ABNORMAL HIGH (ref 70–99)
Glucose-Capillary: 151 mg/dL — ABNORMAL HIGH (ref 70–99)

## 2022-08-29 NOTE — Progress Notes (Signed)
PROGRESS NOTE   Subjective/Complaints: No new complaints or concerns this morning.  Reports his pain has been gradually improving.  ROS: +insomnia-improved, + residual limb pain-improving, +phantom pain-controlled, +constipation. Denies fevers, chills,  SOB, abd pain, /D, new/worsening paresthesias/weakness, or any other complaints at this time.   CP and nausea -continues to be resolved  Objective:   No results found. No results for input(s): "WBC", "HGB", "HCT", "PLT" in the last 72 hours.  No results for input(s): "NA", "K", "CL", "CO2", "GLUCOSE", "BUN", "CREATININE", "CALCIUM" in the last 72 hours.   Intake/Output Summary (Last 24 hours) at 08/29/2022 1230 Last data filed at 08/29/2022 0910 Gross per 24 hour  Intake 120 ml  Output 450 ml  Net -330 ml         Physical Exam: Vital Signs Blood pressure (!) 147/89, pulse 84, temperature 98.1 F (36.7 C), temperature source Oral, resp. rate 18, height  (1.93 m), weight (!) 151.2 kg, SpO2 99 %.  Constitutional: No distress . Vital signs reviewed. Sitting in chair, appears comfortable HEENT: NCAT, EOMI, oral membranes moist Neck: supple Cardiovascular: RRR without murmur. No JVD    Respiratory/Chest: CTA Bilaterally without wheezes or rales. Normal effort    GI/Abdomen: BS normoactive, NT, ND Ext: no clubbing or cyanosis Psych: pleasant and cooperative, appears to be in good spirits Skin: dry, warm. Chronic changes distal RLE d/t lymphedema, trace to 1+ edema. No sign of breakdown. Left BKA with vac/dressing. No significant drainage in vac MSK: Chronic lymphedema RLE. LLE in Vac/dressing with shrinker over top-no drainage in canister today Neuro: Sensation LT intact all 4 extremities   PRIOR EXAMS:  Neurologic: Alert and oriented x 3. Normal insight and awareness. Intact Memory. Normal language and speech. Cranial nerve exam unremarkable. MMT: UE 5/5 prox to  distal. RLE 4/5 prox to distal. LLE: 2/5 HF. Sensory exam appears grossly normal for light touch and pain in all 4 limbs. No limb ataxia or cerebellar signs. No abnormal tone appreciated.     Assessment/Plan: 1. Functional deficits which require 3+ hours per day of interdisciplinary therapy in a comprehensive inpatient rehab setting. Physiatrist is providing close team supervision and 24 hour management of active medical problems listed below. Physiatrist and rehab team continue to assess barriers to discharge/monitor patient progress toward functional and medical goals  Care Tool:  Bathing    Body parts bathed by patient: Right arm, Left arm, Chest, Abdomen, Front perineal area, Face, Right upper leg, Left upper leg   Body parts bathed by helper: Right lower leg, Left lower leg, Buttocks Body parts n/a: Left lower leg   Bathing assist Assist Level: Minimal Assistance - Patient > 75%     Upper Body Dressing/Undressing Upper body dressing   What is the patient wearing?: Pull over shirt    Upper body assist Assist Level: Set up assist    Lower Body Dressing/Undressing Lower body dressing      What is the patient wearing?: Pants     Lower body assist Assist for lower body dressing: Minimal Assistance - Patient > 75%     Toileting Toileting Toileting Activity did not occur (Clothing management and hygiene only): N/A (no  void or bm)  Toileting assist Assist for toileting: Minimal Assistance - Patient > 75%     Transfers Chair/bed transfer  Transfers assist     Chair/bed transfer assist level: Supervision/Verbal cueing     Locomotion Ambulation   Ambulation assist   Ambulation activity did not occur: Safety/medical concerns          Walk 10 feet activity   Assist  Walk 10 feet activity did not occur: Safety/medical concerns        Walk 50 feet activity   Assist Walk 50 feet with 2 turns activity did not occur: Safety/medical concerns          Walk 150 feet activity   Assist Walk 150 feet activity did not occur: Safety/medical concerns         Walk 10 feet on uneven surface  activity   Assist Walk 10 feet on uneven surfaces activity did not occur: Safety/medical concerns         Wheelchair     Assist Is the patient using a wheelchair?: Yes Type of Wheelchair: Manual    Wheelchair assist level: Supervision/Verbal cueing Max wheelchair distance: 100    Wheelchair 50 feet with 2 turns activity    Assist        Assist Level: Supervision/Verbal cueing   Wheelchair 150 feet activity     Assist      Assist Level: Supervision/Verbal cueing   Blood pressure (!) 147/89, pulse 84, temperature 98.1 F (36.7 C), temperature source Oral, resp. rate 18, height  (1.93 m), weight (!) 151.2 kg, SpO2 99 %.  Medical Problem List and Plan: 1. 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  Antithrombotics: -DVT/anticoagulation:  Pharmaceutical: Lovenox  QD             -antiplatelet therapy: N/A 3. Pain Management: Tylenol  TID, Oxycodone 5-10mg  q4h prn. Tramadol  q6h PRN.              -add robaxin  q6h prn for spasms.  -08/23/22 having phantom pain interfering with sleep, will add gabapentin  TID for now, monitor for effect/side effects.  4/15 reports pain controlled -4/17 increase gabapentin to   TID Increase robaxin dose to  PRN -4/19 reports pain continues to improve, continue current regimen for now  4. Mood/Behavior/Sleep: LCSW to follow for evaluation and support.              -antipsychotic agents: N/A -08/23/22 poor sleep, ordered melatonin  QHS, also has trazodone PRN--improved 08/24/22  5. Neuropsych/cognition: This patient is capable of making  decisions on his own behalf. 6. Skin/Wound Care: Monitor wound for healing             --limb protector when up -he's post-op day #7. Spoke with Dr Lajoyce Corners who recommended changing vac to prevena and continuing for another week.  -consider DC vac 4/21 7. Fluids/Electrolytes/Nutrition: Monitor I/O. Continue Vitamin C and Zinc and MVI. Monitor routine Monday labs.              --add juven BID additionally -08/23/22 BMP with mild hyponatremia 134, otherwise fairly unremarkable, monitor on routine Monday labs -4/15 Na up to 137, improved 8.  T2DM: Hgb A1c- 10.3 and poorly controlled. --dietician has added double portion as well as snacks TID to help with hunger/apptite             --monitor BS ac/hs and titrate insulin as indicated             --continue Insulin glargine 14 units QD and SSI  -4/16 fair control, continue current regimen for now  4/17 monitor for now, conisder increase glargine   4/19 well-controlled overall, continue to monitor trend CBG (last 3)  Recent Labs    08/28/22 2049 08/29/22 0601 08/29/22 1224  GLUCAP 129* 115* 106*     9. Dilated CM of unclear etiology: On Lasix 40mg  QD, Entresto 49-51mg  BID (dose increased today) and spironolactone 25mg  QD             --will add low salt restrictions. Monitor daily weights --add low dose KCL due to recurrent hypokalemia requiring intermittent supplementation --recheck BMET 04/13 and on 04/15 as on spiro -pt is having nausea and ?diarrhea with coreg increase. Will convert him to metoprolol 25mg  bid to start--->titrate as needed. -08/23/22 K+ 3.7; pt educated on med changes and importance of compliance. No weight done today, ordered daily weights.  -08/24/22 no wt again, asked nursing to please make sure daily wt done; slight increase in RLE swelling noted, but mild, monitor for now -4/16 Will contact cardiology regarding DC of entresto -4/18 Seen by cardiology appreciate assistance, consider increase lopressor  Filed Weights    08/27/22 0630 08/28/22 0455 08/29/22 0500  Weight: (!) 151.5 kg (!) 151.7 kg (!) 151.2 kg    10. HTN: Monitor BP TID--now on Lasix, Entresto and Spironolactone.             -metoprolol as above  -08/23/22 BPs elevated but recent change to meds, monitor for now  -4/15 mildly elevated, monitor for now  -4/16 Entresto DC, will consider additional medication if BP remains elevated, will discuss with cardiology   -4/17 Cardiology notified, will contract cardiology again for possible different medication, cardiology plans to discuss with   -4/18 increase lopressor dose to 50mg  twice daily  -4/19 blood pressures appear to be improving, continue monitor response  Vitals:   08/26/22 1137 08/26/22 1434 08/26/22 1847 08/26/22 2020  BP: (!) 152/85 (!) 160/86 (!) 152/73 (!) 173/85   08/27/22 0024 08/27/22 0630 08/27/22 1516 08/27/22 1933  BP: 133/74 (!) 165/86 (!) 151/96 (!) 160/89   08/28/22 0455 08/28/22 1340 08/28/22 1937 08/29/22 0602  BP: (!) 142/79 (!) 144/72 (!) 147/72 (!) 147/89    11. Morbid obesity: Continue to educate on diet, exercise and compliance to help promote overall health and mobility.    12. Anemia:   -08/23/22 HGb 9.2, relatively stable; monitor on routine labs  -4/15 HGB stable at 9.2 13. Constipation: usually goes daily at home  -08/24/22 no BM in 2 days, will start miralax 17g QD for now; monitor  -4/19 last BM today improved 14.  Chest pain and nausea, improving  -Check EKG-NSR, Toponin- negative x3  -Pt reports this is related to his use of entresto, will DC this medication, He says he is feeling better now  -4/19 reports this has resolved since stopping Entresto  LOS: 7 days A FACE TO FACE EVALUATION WAS PERFORMED  Fanny Dance 08/29/2022, 12:30 PM

## 2022-08-29 NOTE — Progress Notes (Signed)
Occupational Therapy Weekly Progress Note  Patient Details  Name: Alan Tapia. MRN: [id number] Date of Birth: 1976-02-15  Beginning of progress report period: 08/23/22 End of progress report period: 08/29/22  Today's Date: 08/29/2022 OT Individual Time: 4098-1191 OT Individual Time Calculation (min): 56 min    Patient has met 3 of 3 short term goals.  Alan Tapia is making excellent progress toward his OT goals. His main barrier to discharge is an inaccessible home environment and large w/c will not fit through home doorways, so he will need to become proficient at stand pivot transfers. He is (S) with lateral scoot transfers but inconsistent with standing with the RW. Family education will be completed closer to d/c.   Patient continues to demonstrate the following deficits: muscle weakness, decreased cardiorespiratoy endurance, and decreased standing balance, decreased postural control, and decreased balance strategies and therefore will continue to benefit from skilled OT intervention to enhance overall performance with BADL and iADL.  Patient progressing toward long term goals..  Continue plan of care.  OT Short Term Goals Week 1:  OT Short Term Goal 1 (Week 1): Pt will complete LB dressing with Min A + LRAD. OT Short Term Goal 1 - Progress (Week 1): Met OT Short Term Goal 2 (Week 1): Pt will complete LB bathing with Min A + LRAD. OT Short Term Goal 2 - Progress (Week 1): Met OT Short Term Goal 3 (Week 1): Pt will complete toilet transfer with CGA + LRAD. OT Short Term Goal 3 - Progress (Week 1): Met Week 2:  OT Short Term Goal 1 (Week 2): Pt will complete a stand pivot transfer with min A using LRAD OT Short Term Goal 2 (Week 2): Pt will complete a sit > stand with CGA consistently from the w/c OT Short Term Goal 3 (Week 2): Pt will complete toileting tasks with CGA overall  Skilled Therapeutic Interventions/Progress Updates:    Pt received sitting in the w/c with 7/10 pain in the  entire L residual limb, agreeable to OT session. Reporting he is premedicated. He completed 50 ft of w/c propulsion before needing a rest 2/2 fatigue. He completed a lateral scoot to the mat with (S). He completed 3x10 modified sit ups to challenge core strength  needed to support dynamic balance in standing. He completed BUE endurance based circuit holding a 4lb medicine ball, 3x26 repetitions. He completed standing level hopping attempts with the RW with +2 present for safety. He was unable to fully clear foot to hop forward despite several attempts. He was able to pivot on the foot with min A. He completed 2 hops backward with min A. Upon the second trial he had great difficulty with standing, requiring elevation of mat and then he was able to stand with heavily stabilization of the RW. He completed modified mini squats with min A, and heavy cueing d/t poor body mechanics with majority of weight shift into his toes instead of heel. He completed a stand pivot transfer back to the w/c with min A. He returned to his room and was left sitting up with all needs met.    Therapy Documentation Precautions:  Precautions Precautions: Fall, Other (comment) (wound vac) Required Braces or Orthoses: Other Brace Other Brace: L limb guard Restrictions Weight Bearing Restrictions: Yes LLE Weight Bearing: Non weight bearing  Therapy/Group: Individual Therapy  Crissie Reese 08/29/2022, 11:38 AM

## 2022-08-29 NOTE — Progress Notes (Signed)
Physical Therapy Session Note  Patient Details  Name: Alan Tapia. MRN: [id number] Date of Birth: 08/07/1975  Today's Date: 08/29/2022 PT Individual Time: 0902-1005 PT Individual Time Calculation (min): 63 min   Short Term Goals: Week 1:  PT Short Term Goal 1 (Week 1): Pt will transfer sit to stand w/ mod A. PT Short Term Goal 2 (Week 1): Pt will transfer bed<> w/c w/ min A. PT Short Term Goal 3 (Week 1): PT will assess gait.  Skilled Therapeutic Interventions/Progress Updates: Pt presented in w/c agreeable to therapy. Pt states pain 8/10, premedicated with rest breaks and repositioning provided as needed. Pt propelled to main gym with supervision, use of surgical gloves and theraband on rims for increased traction. Pt required x 1 brief rest due to fatigue. Pt then performed lateral scoot transfer to mat with supervision with pt being mod I for parts management. Worked on Sit to stand with pt attempting to push from lowered surface but PTA educating pt on starting at successful level then working on lower surfaces. Pt required several attempts as observed that pt tended to push R foot out just prior to attempting to power up therefore unable to adequately generate enough power to come to full stand. After several attempts pt was able to successfully complete stand with modA. In standing pt able to perform hip flexion with residual limb x 10 however due to movement limb guard dropped off. Pt expressed significant fatigue due to standing attempts therefore transitioned to supine and performed supine therex as follows.   SLR 2 x 10 SAQ 2 x 10 SAQ with RLE 10# weight 2 x 10, 15# x 10 focus on eccentric loading  SL bridge (RLE) 2 x 10  Transitioned  sidelying and performed hip abd 2 x 10  Pt able to lay prone x 3 min for hip flexor stretch followed by hip extension 2 x 10  Returned to EOM and performed lateral scoot transfer to w/c from lowered surface with supervision and increased effort.  Pt transported back to room at end of session and remained in w/c with call bell within reach and needs met.       Therapy Documentation Precautions:  Precautions Precautions: Fall, Other (comment) (wound vac) Required Braces or Orthoses: Other Brace Other Brace: L limb guard Restrictions Weight Bearing Restrictions: Yes LLE Weight Bearing: Non weight bearing    Therapy/Group: Individual Therapy  Alan Tapia 08/29/2022, 3:48 PM

## 2022-08-29 NOTE — Consult Note (Signed)
Neuropsychological Consultation Comprehensive Inpatient Rehab   Patient:   Isa Kohlenberg.   DOB:   10/25/1975  MR Number:  [id number]  Location:  [facility] [address] CENTER [id number] [name] [address] Dept: [phone] Loc: [phone]           Date of Service:   08/29/2022  Start Time:   8 AM End Time:   9 AM  Provider/Observer:  Arley Phenix, Psy.D.       Clinical Neuropsychologist       Billing Code/Service: [phone]  Reason for Service:    Louis Ivery. is a 47 year old male referred for neuropsychological consultation after recent below the knee amputation subsequent to complications from osteomyelitis and infection after attempts to salvage limb were not successful.  Patient has a past medical history including hypertension, type 2 diabetes, left foot wound persisting for greater than 2 years.  Due to psychosocial complications patient, who had been on antibiotics consistently, was unable to continue and was off his antibiotics for 4 weeks with worsening of infection.  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  During today's visit, the patient was oriented x 4 with good cognition and awareness of recent medical events and good historian regarding past efforts.  Patient reported that it was actually his wife and father who are having the greatest deal of difficulty coping with his BKA if they had consistently tried to have him do what ever he could do to salvage his limb.  Patient knew that he was getting progressively sicker and that ultimately his foot was not salvageable and agreed to BKA.  Patient reports that there have been some coping issues and adjusting to his amputation but remains motivated to actively participate in therapeutic interventions and reports that current mood state is generally positive.  HPI for the current  admission:    HPI:  Kastin Cerda is a 47 year old male with history of HTN, T2DM, morbid obesity- BMI, left foot wound X 2 years w/p ray amp and last debrided earlier this year with recommendations of IV Vanc/Zosyn but PICC fell out and he had to come to [place] due to family emergency and had missed antibiotics X 4 weeks.  He was admitted on 08/13/22 with increase swelling, increase in drainage and MRI of foot done showing large plantar wound with fluid collection/abscess or coalescent edema, osteomyelitis of 5th proximal phalanx and entire 5 MT, septic arthritis v/s osteo 4th MTP joint and question of osteo of cuboid and at post amp site.  He was started on IV antibiotics for acute on chronic osteomyelitis and underwent L-BKA on 08/15/22 by Dr. Lajoyce Corners.    2D echo done due to chronic BLE lymphedema as well as reports of SOB and intermittent CP.  This revealed EF 35-40% with global hypokinesis with mild dilation of LV/LA and dilated inferior vena cava.he was started on GDMT due to suspicion of CHF and Dr. Shari Prows was consulted for input. She recommended multiple medication changes and recommended not adding SGLT2i until DM better controlled. He is to follow up in office for repeat echo in 4-6 weeks and if EF remains depressed; will need ischemia work up.  RD and HF pharmacy have been following for education/input.  Meal coverage added for better BS control per diabetes coordinator's input. Patient was independent PTA. PT/OT has been working with patient who continues to require mod  A+2 to stand for 30 seconds, has LOB posteriorly and attempting pivot transfers. CIR recommended due to functional decline.   Medical History:   Past Medical History:  Diagnosis Date   Hypertension    Uncontrolled type 2 diabetes mellitus with hyperglycemia, with long-term current use of insulin 06/06/2022         Patient Active Problem List   Diagnosis Date Noted   Adjustment disorder 08/29/2022   Acute systolic CHF  (congestive heart failure) 08/27/2022   Primary hypertension 08/27/2022   Dilated cardiomyopathy 08/26/2022   Below-knee amputation of left lower extremity 08/22/2022   Lymphedema 08/15/2022   Severe protein-calorie malnutrition 08/15/2022   Sepsis 08/15/2022   Subacute osteomyelitis, left ankle and foot 08/13/2022   Hypokalemia 08/13/2022   Diabetic foot infection 06/06/2022   Uncontrolled type 2 diabetes mellitus with hyperglycemia, with long-term current use of insulin 06/06/2022   Hypertensive urgency 06/06/2022    Behavioral Observation/Mental Status:   Raynell Scott.  presents as a 47 y.o.-year-old Right handed African American Male who appeared his stated age. his dress was Appropriate and he was Well Groomed and his manners were Appropriate to the situation.  his participation was indicative of Appropriate and Attentive behaviors.  There were physical disabilities noted.  he displayed an appropriate level of cooperation and motivation.    Interactions:    Active Appropriate and Attentive  Attention:   within normal limits and attention span and concentration were age appropriate  Memory:   within normal limits; recent and remote memory intact  Visuo-spatial:   not examined  Speech (Volume):  normal  Speech:   normal; normal  Thought Process:  Coherent and Relevant  Coherent and Logical  Though Content:  WNL; not suicidal and not homicidal  Orientation:   person, place, time/date, and situation  Judgment:   Good  Planning:   Good  Affect:    Appropriate  Mood:    Euthymic  Insight:   Good  Intelligence:   normal  Family Med/Psych History:  Family History  Problem Relation Age of Onset   Arthritis Mother    Allergies Father    Hypertension Father    Diabetes Father     Impression/DX:   Havard Radigan. is a 48 year old male referred for neuropsychological consultation after recent below the knee amputation subsequent to complications from  osteomyelitis and infection after attempts to salvage limb were not successful.  Patient has a past medical history including hypertension, type 2 diabetes, left foot wound persisting for greater than 2 years.  Due to psychosocial complications patient, who had been on antibiotics consistently, was unable to continue and was off his antibiotics for 4 weeks with worsening of infection.  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  During today's visit, the patient was oriented x 4 with good cognition and awareness of recent medical events and good historian regarding past efforts.  Patient reported that it was actually his wife and father who are having the greatest deal of difficulty coping with his BKA if they had consistently tried to have him do what ever he could do to salvage his limb.  Patient knew that he was getting progressively sicker and that ultimately his foot was not salvageable and agreed to BKA.  Patient reports that there have been some coping issues and adjusting to his amputation but remains motivated to actively participate in therapeutic interventions and reports that current mood state is generally positive.  Disposition/Plan:  Today we worked on coping and adjustment issues with recent left BKA with patient improving his management and remaining motivated and focused on rehab.  Also discussed expectations going forward regarding expectations around prostatic.         Electronically Signed   _______________________ Arley Phenix, Psy.D. Clinical Neuropsychologist

## 2022-08-29 NOTE — Progress Notes (Signed)
Physical Therapy Session Note  Patient Details  Name: Alan Tapia. MRN: [id number] Date of Birth: Sep 21, 1975  Today's Date: 08/29/2022 PT Individual Time: 1300-1415 PT Individual Time Calculation (min): 75 min   Short Term Goals: Week 1:  PT Short Term Goal 1 (Week 1): Pt will transfer sit to stand w/ mod A. PT Short Term Goal 2 (Week 1): Pt will transfer bed<> w/c w/ min A. PT Short Term Goal 3 (Week 1): PT will assess gait.  Skilled Therapeutic Interventions/Progress Updates: Pt presents sitting in w/c and agreeable to therapy.  Pt wheeled out of room and to cross hallway w/ supervision.  PT completed w/c mobility to [facility] and outside for energy conservation.  Pt performed 3 x 10 w/c push-ups w/ verbal cues for forward scoot and maintaining forward lean to increase WB through RLE.  Pt removed limb protector to perform kne flexion/ext w/ cues for increased ROM, marching.  Pt unable to attempt standing from w/c height.  Pt returned to small gym and performed lateral scoot/squat pivot w/c > mat table and supervision, but cues to avoid "throwing" self to next surface esp. W/C 2/2 movability of w/c.  Pt performed multiple sit to stand from mat table at height of 24" w/ mod A.  Again verbal cues for controlled sit to stand for safety.  Pt able to stand and perform unilateral, alternating reaches forward.  Pt able to hop back to mat table w/ mod A and verbal cues for concurrent use of UES w/ hop back.  Pt performed lateral scoot w/ supervision and verbal cues for safety.  Pt returned to room and remained sitting in w/c w/ all needs in reach.      Therapy Documentation Precautions:  Precautions Precautions: Fall, Other (comment) (wound vac) Required Braces or Orthoses: Other Brace Other Brace: L limb guard Restrictions Weight Bearing Restrictions: Yes LLE Weight Bearing: Non weight bearing General:   Vital Signs:  Pain:5/10 to residual limb       Therapy/Group: Individual  Therapy  [name] 08/29/2022, 3:36 PM

## 2022-08-29 NOTE — Progress Notes (Signed)
Patient ID: [name], male   DOB: July 24, 1975, 47 y.o.   MRN: [id number]  Attempted to reach wife to try to get [place] number unable to leave a message on her cell phone. Pt reports wife is taking care of business today. Will try back later.

## 2022-08-30 LAB — GLUCOSE, CAPILLARY
Glucose-Capillary: 115 mg/dL — ABNORMAL HIGH (ref 70–99)
Glucose-Capillary: 115 mg/dL — ABNORMAL HIGH (ref 70–99)
Glucose-Capillary: 168 mg/dL — ABNORMAL HIGH (ref 70–99)
Glucose-Capillary: 153 mg/dL — ABNORMAL HIGH (ref 70–99)

## 2022-08-30 MED ORDER — GABAPENTIN 100 MG PO CAPS
200.0000 mg | ORAL_CAPSULE | Freq: Three times a day (TID) | ORAL | Status: DC
  Administered 2022-08-30 – 2022-09-02 (×9): 200 mg via ORAL
  Filled 2022-08-30 (×9): qty 2

## 2022-08-30 NOTE — Progress Notes (Signed)
Physical Therapy Session Note  Patient Details  Name: Alan Tapia. MRN: [id number] Date of Birth: [birth year]  Today's Date: 08/30/2022 PT Individual Time:  -      Short Term Goals: Week 1:  PT Short Term Goal 1 (Week 1): Pt will transfer sit to stand w/ mod A. PT Short Term Goal 2 (Week 1): Pt will transfer bed<> w/c w/ min A. PT Short Term Goal 3 (Week 1): PT will assess gait.  Skilled Therapeutic Interventions/Progress Updates: Patient in [facility] with wife present on entrance to room. Patient alert and agreeable to PT session.   Patient reported no pain at beginning of session.   Therapeutic Activity: Transfers: Pt performed lateral transers throughout session with supervision for safety.  Wheelchair Mobility:  Pt propelled WC 100'  from room to ortho gym with supervision for safety. Provided VC for doorway navigation (cues to propel WC wide like driving an [id number]. Patient demonstrated knowledge of WC parts in order to prepare for transfer. R Leg pedal on WC was slightly bent. New leg pedal provided and adjusted.  Therapeutic Exercise: Pt performed the following exercises with therapist providing cues for proper sequence, and slow/controlled motion (eccentric>concentric) - LAQ x 10 8lb ankle weight; progressed 16 pounds for 2 x 10 - supine SLR L LE 2 x10;  - SL Bridge R LE x 10 - Bridge with R LE hooklying and L popliteal fossa on cylinder wedge 3 x 10 - R knee flexion (prone) with yellow theraband (patient reported "15/10" pain on L residual limb with presentation of spasms for a couple of seconds. Patient reported this happens every night). Rest break provided and decreased resistance on theraband as to avoid increased pain - Prone elbow press ups 3 x 1 min hold  - Seated dips on yoga blocks   Patient transported in [facility] for time management and left in [facility] at end of session with brakes locked, wife present and all needs within reach.      Therapy Documentation Precautions:   Precautions Precautions: Fall, Other (comment) (wound vac) Required Braces or Orthoses: Other Brace Other Brace: L limb guard Restrictions Weight Bearing Restrictions: Yes LLE Weight Bearing: Non weight bearing   Therapy/Group: Individual Therapy  Clela Hagadorn PTA 08/30/2022, 7:44 AM

## 2022-08-30 NOTE — Progress Notes (Signed)
Was called by RN about patient's complaint of right leg feeling weak/tight. He has no sensory symptoms in the leg, and RUE/ face without any symptoms at all. Left leg remains tender post-op with stump and phantom limb pain. Minimal low back pain. Cognitively intact. A bit anxious. He has a history of severe lymphedema in both legs. He was working on RLE strengthening exercises with PT this morning. AVSS.  I suspect he is experiencing fatigue and spasm of his RLE in the setting of his severe lymphedema. Nurse will administer robaxin and observe. Asked her to reassure patient. Will observe for any progressive symptoms .   Ranelle Oyster, MD, [facility] Physical Medicine & Rehabilitation Medical Director Rehabilitation Services 08/30/2022

## 2022-08-30 NOTE — Progress Notes (Signed)
PROGRESS NOTE   Subjective/Complaints: Pt doing well today. Sleep is gradually improving, doesn't want to make any changes to meds despite the fact that he still has some difficulty sleeping at times.  Phantom pain ongoing but maybe gradually improving some. Pain is otherwise well controlled with meds. LBM 2 days ago, but doesn't want to change any meds. Urinating fine. Denies any other complaints/concerns today.   ROS: +insomnia-improving/fluctuating, + residual limb pain-improving, +phantom pain-controlled, +constipation. Denies fevers, chills, CP, SOB, abd pain, N/V/D, new/worsening paresthesias/weakness, or any other complaints at this time.     Objective:   No results found. No results for input(s): "WBC", "HGB", "HCT", "PLT" in the last 72 hours.  No results for input(s): "NA", "K", "CL", "CO2", "GLUCOSE", "BUN", "CREATININE", "CALCIUM" in the last 72 hours.   Intake/Output Summary (Last 24 hours) at 08/30/2022 1122 Last data filed at 08/30/2022 0742 Gross per 24 hour  Intake 840 ml  Output 500 ml  Net 340 ml        Physical Exam: Vital Signs Blood pressure (!) 160/76, pulse 88, temperature 98.3 F (36.8 C), temperature source Oral, resp. rate 16, height 6\' 4"  (1.93 m), weight (!) 151 kg, SpO2 97 %.  Constitutional: No distress . Vital signs reviewed. Sitting at EOB, appears comfortable HEENT: NCAT, EOMI, oral membranes moist Neck: supple Cardiovascular: RRR without murmur. No JVD    Respiratory/Chest: CTA Bilaterally without wheezes or rales. Normal effort    GI/Abdomen: BS normoactive, NT, ND Ext: no clubbing or cyanosis Psych: pleasant and cooperative, appears to be in good spirits Skin: dry, warm. Chronic changes distal RLE d/t lymphedema, trace to 1+ edema. No sign of breakdown. Left BKA with sleeve.     PRIOR EXAMS:  MSK: Chronic lymphedema RLE. LLE in Vac/dressing with shrinker over top-no drainage in  canister today Neuro: Sensation LT intact all 4 extremities Neurologic: Alert and oriented x 3. Normal insight and awareness. Intact Memory. Normal language and speech. Cranial nerve exam unremarkable. MMT: UE 5/5 prox to distal. RLE 4/5 prox to distal. LLE: 2/5 HF. Sensory exam appears grossly normal for light touch and pain in all 4 limbs. No limb ataxia or cerebellar signs. No abnormal tone appreciated.     Assessment/Plan: 1. Functional deficits which require 3+ hours per day of interdisciplinary therapy in a comprehensive inpatient rehab setting. Physiatrist is providing close team supervision and 24 hour management of active medical problems listed below. Physiatrist and rehab team continue to assess barriers to discharge/monitor patient progress toward functional and medical goals  Care Tool:  Bathing    Body parts bathed by patient: Right arm, Left arm, Chest, Abdomen, Front perineal area, Face, Right upper leg, Left upper leg   Body parts bathed by helper: Right lower leg, Left lower leg, Buttocks Body parts n/a: Left lower leg   Bathing assist Assist Level: Minimal Assistance - Patient > 75%     Upper Body Dressing/Undressing Upper body dressing   What is the patient wearing?: Pull over shirt    Upper body assist Assist Level: Set up assist    Lower Body Dressing/Undressing Lower body dressing      What is the patient  wearing?: Pants     Lower body assist Assist for lower body dressing: Minimal Assistance - Patient > 75%     Toileting Toileting Toileting Activity did not occur Press photographer and hygiene only): N/A (no void or bm)  Toileting assist Assist for toileting: Minimal Assistance - Patient > 75%     Transfers Chair/bed transfer  Transfers assist     Chair/bed transfer assist level: Supervision/Verbal cueing     Locomotion Ambulation   Ambulation assist   Ambulation activity did not occur: Safety/medical concerns          Walk  10 feet activity   Assist  Walk 10 feet activity did not occur: Safety/medical concerns        Walk 50 feet activity   Assist Walk 50 feet with 2 turns activity did not occur: Safety/medical concerns         Walk 150 feet activity   Assist Walk 150 feet activity did not occur: Safety/medical concerns         Walk 10 feet on uneven surface  activity   Assist Walk 10 feet on uneven surfaces activity did not occur: Safety/medical concerns         Wheelchair     Assist Is the patient using a wheelchair?: Yes Type of Wheelchair: Manual    Wheelchair assist level: Supervision/Verbal cueing Max wheelchair distance: 100    Wheelchair 50 feet with 2 turns activity    Assist        Assist Level: Supervision/Verbal cueing   Wheelchair 150 feet activity     Assist      Assist Level: Supervision/Verbal cueing   Blood pressure (!) 160/76, pulse 88, temperature 98.3 F (36.8 C), temperature source Oral, resp. rate 16, height 6\' 4"  (1.93 m), weight (!) 151 kg, SpO2 97 %.  Medical Problem List and Plan: 1. 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  Antithrombotics: -DVT/anticoagulation:  Pharmaceutical: Lovenox 75mg  QD             -antiplatelet therapy: N/A 3. Pain Management: Tylenol 650mg  TID, Oxycodone 5-10mg  q4h prn. Tramadol 50mg  q6h PRN.              -add robaxin 500mg  q6h prn for spasms.  -08/23/22 having phantom pain interfering with sleep, will add gabapentin 100mg  TID for now, monitor for effect/side effects.  4/15 reports pain controlled -4/17 increase gabapentin to  200mg  TID, Increase robaxin dose to 750mg  PRN -4/19 reports pain continues to improve, continue current regimen for now -08/30/22 gabapentin dosing never increased,  will inc to 200mg  TID now since phantom pain still seems to be an issue  4. Mood/Behavior/Sleep: LCSW to follow for evaluation and support.              -antipsychotic agents: N/A -08/23/22 poor sleep, ordered melatonin 5mg  QHS, also has trazodone PRN--improved 08/24/22  5. Neuropsych/cognition: This patient is capable of making decisions on his own behalf. 6. Skin/Wound Care: Monitor wound for healing             --limb protector when up -he's post-op day #7. Spoke with Dr Lajoyce Corners who recommended changing vac to prevena and continuing for another week.  -  consider DC vac 4/21 7. Fluids/Electrolytes/Nutrition: Monitor I/O. Continue Vitamin C and Zinc and MVI. Monitor routine Monday labs.              --add juven BID additionally -08/23/22 BMP with mild hyponatremia 134, otherwise fairly unremarkable, monitor on routine Monday labs -4/15 Na up to 137, improved 8. T2DM: Hgb A1c- 10.3 and poorly controlled. --dietician has added double portion as well as snacks TID to help with hunger/apptite             --monitor BS ac/hs and titrate insulin as indicated             --continue Insulin glargine 14 units QD and SSI  -4/16 fair control, continue current regimen for now  -4/17 monitor for now, conisder increase glargine   -08/30/22 well-controlled, continue to monitor  CBG (last 3)  Recent Labs    08/29/22 1611 08/29/22 2041 08/30/22 0612  GLUCAP 133* 151* 115*    9. Dilated CM of unclear etiology: On Lasix  QD, Entresto 49-51mg  BID (dose increased today>>d/c'd 4/16) and spironolactone  QD             --will add low salt restrictions. Monitor daily weights --add low dose KCL due to recurrent hypokalemia requiring intermittent supplementation --recheck BMET 04/13 and on 04/15 as on spiro -pt is having nausea and ?diarrhea with coreg increase. Will convert him to metoprolol  bid to start--->titrate as needed. -08/23/22 K+ 3.7; pt educated on med changes and importance of compliance. No  weight done today, ordered daily weights.  -08/24/22 no wt again, asked nursing to please make sure daily wt done; slight increase in RLE swelling noted, but mild, monitor for now -4/16 Will contact cardiology regarding DC of entresto -4/18 Seen by cardiology appreciate assistance, increase lopressor to  BID -08/30/22 wt stable, monitor  Filed Weights   08/28/22 0455 08/29/22 0500 08/30/22 0611  Weight: (!) 151.7 kg (!) 151.2 kg (!) 151 kg    10. HTN: Monitor BP TID--now on Lasix, Entresto and Spironolactone.             -metoprolol as above  -08/23/22 BPs elevated but recent change to meds, monitor for now  -4/15 mildly elevated, monitor for now -4/16 Entresto DC, will consider additional medication if BP remains elevated, will discuss with cardiology  -4/17 Cardiology notified, will contract cardiology again for possible different medication, cardiology plans to discuss with   -4/18 increase lopressor dose to  twice daily -4/19 blood pressures appear to be improving, continue monitor response -08/30/22 BPs still elevated but gradually improving, monitor trend  Vitals:   08/27/22 0630 08/27/22 1516 08/27/22 1933 08/28/22 0455  BP: (!) 165/86 (!) 151/96 (!) 160/89 (!) 142/79   08/28/22 1340 08/28/22 1937 08/29/22 0602 08/29/22 1612  BP: (!) 144/72 (!) 147/72 (!) 147/89 (!) 151/81   08/29/22 1938 08/29/22 1951 08/30/22 0611 08/30/22 0834  BP: (!) 153/77 (!) 149/86 (!) 150/84 (!) 160/76    11. Morbid obesity: Continue to educate on diet, exercise and compliance to help promote overall health and mobility.    12. Anemia:   -08/23/22 HGb 9.2, relatively stable; monitor on routine labs  -4/15 HGB stable at 9.2 13. Constipation: usually goes daily at home  -08/24/22 no BM in 2 days, will start miralax 17g QD for now; monitor  -4/19 last BM today improved  -08/30/22 LBM 2 days ago but doesn't want to change meds; monitor  14.  Chest pain and nausea, improving  -  Check EKG-NSR,  Toponin- negative x3 -Pt reports this is related to his use of entresto, will DC this medication, He says he is feeling better now  -4/19 reports this has resolved since stopping Entresto  LOS: 8 days A FACE TO [facility] EVALUATION WAS PERFORMED  [address] 08/30/2022, 11:22 AM

## 2022-08-31 LAB — GLUCOSE, CAPILLARY
Glucose-Capillary: 214 mg/dL — ABNORMAL HIGH (ref 70–99)
Glucose-Capillary: 157 mg/dL — ABNORMAL HIGH (ref 70–99)
Glucose-Capillary: 158 mg/dL — ABNORMAL HIGH (ref 70–99)
Glucose-Capillary: 160 mg/dL — ABNORMAL HIGH (ref 70–99)

## 2022-08-31 NOTE — Progress Notes (Cosign Needed)
Conducted focused assessment of heart sounds, lung sounds, and pain level. Pt. had clear lung sounds, heart sounds were regular no abnormalities recorded. Pain medication was given earlier so pain level was 2 on 0-10 scale, having decreased from a higher pain score.

## 2022-08-31 NOTE — Progress Notes (Signed)
Conducted focused assessment of heart sounds, lung sounds, and pain level. Pt. has clear lung sounds, heart sounds were regular no abnormalities recorded. Pain medication was given earlier so pain level was 2 on 0-10 scale, having decreased from a higher pain score at 1045 am 08/31/2022.  I agree with this student's documentation.

## 2022-08-31 NOTE — Progress Notes (Signed)
PROGRESS NOTE   Subjective/Complaints:  Pt doing well today, had an episode of RLE weakness yesterday but this has resolved. Sleep is "so so" but he still doesn't want to make any changes to meds.  Pain is fairly well controlled with meds. LBM yesterday. Urinating fine. States his Medical illustrator isn't working. Also doesn't know where his TED hose are for his RLE, never received them. Denies any other complaints/concerns today.   ROS: +insomnia-improving/fluctuating, + residual limb pain-improving, +phantom pain-controlled, +constipation-improving. Denies fevers, chills, CP, SOB, abd pain, N/V/D, new/worsening paresthesias/weakness, or any other complaints at this time.     Objective:   No results found. No results for input(s): "WBC", "HGB", "HCT", "PLT" in the last 72 hours.  No results for input(s): "NA", "K", "CL", "CO2", "GLUCOSE", "BUN", "CREATININE", "CALCIUM" in the last 72 hours.   Intake/Output Summary (Last 24 hours) at 08/31/2022 1108 Last data filed at 08/31/2022 1014 Gross per 24 hour  Intake 1080 ml  Output 1700 ml  Net -620 ml        Physical Exam: Vital Signs Blood pressure 125/67, pulse 80, temperature 97.9 F (36.6 C), temperature source Axillary, resp. rate 16, height  (1.93 m), weight (!) 152.5 kg, SpO2 99 %.  Constitutional: No distress . Vital signs reviewed. Laying in bed, appears comfortable HEENT: NCAT, EOMI, oral membranes moist Neck: supple Cardiovascular: RRR without murmur. No JVD    Respiratory/Chest: CTA Bilaterally without wheezes or rales. Normal effort    GI/Abdomen: BS normoactive, NT, ND Ext: no clubbing or cyanosis Psych: pleasant and cooperative, appears to be in good spirits Skin: dry, warm. Chronic changes distal RLE d/t lymphedema, 1+ edema. No sign of breakdown. Left BKA with sleeve.     PRIOR EXAMS:  MSK: Chronic lymphedema RLE. LLE in Vac/dressing with shrinker over  top-no drainage in canister today Neuro: Sensation LT intact all 4 extremities Neurologic: Alert and oriented x 3. Normal insight and awareness. Intact Memory. Normal language and speech. Cranial nerve exam unremarkable. MMT: UE 5/5 prox to distal. RLE 4/5 prox to distal. LLE: 2/5 HF. Sensory exam appears grossly normal for light touch and pain in all 4 limbs. No limb ataxia or cerebellar signs. No abnormal tone appreciated.     Assessment/Plan: 1. Functional deficits which require 3+ hours per day of interdisciplinary therapy in a comprehensive inpatient rehab setting. Physiatrist is providing close team supervision and 24 hour management of active medical problems listed below. Physiatrist and rehab team continue to assess barriers to discharge/monitor patient progress toward functional and medical goals  Care Tool:  Bathing    Body parts bathed by patient: Right arm, Left arm, Chest, Abdomen, Front perineal area, Face, Right upper leg, Left upper leg   Body parts bathed by helper: Right lower leg, Left lower leg, Buttocks Body parts n/a: Left lower leg   Bathing assist Assist Level: Minimal Assistance - Patient > 75%     Upper Body Dressing/Undressing Upper body dressing   What is the patient wearing?: Pull over shirt    Upper body assist Assist Level: Set up assist    Lower Body Dressing/Undressing Lower body dressing      What  is the patient wearing?: Pants     Lower body assist Assist for lower body dressing: Minimal Assistance - Patient > 75%     Toileting Toileting Toileting Activity did not occur Press photographer and hygiene only): N/A (no void or bm)  Toileting assist Assist for toileting: Minimal Assistance - Patient > 75%     Transfers Chair/bed transfer  Transfers assist     Chair/bed transfer assist level: Supervision/Verbal cueing     Locomotion Ambulation   Ambulation assist   Ambulation activity did not occur: Safety/medical  concerns          Walk 10 feet activity   Assist  Walk 10 feet activity did not occur: Safety/medical concerns        Walk 50 feet activity   Assist Walk 50 feet with 2 turns activity did not occur: Safety/medical concerns         Walk 150 feet activity   Assist Walk 150 feet activity did not occur: Safety/medical concerns         Walk 10 feet on uneven surface  activity   Assist Walk 10 feet on uneven surfaces activity did not occur: Safety/medical concerns         Wheelchair     Assist Is the patient using a wheelchair?: Yes Type of Wheelchair: Manual    Wheelchair assist level: Supervision/Verbal cueing Max wheelchair distance: 100    Wheelchair 50 feet with 2 turns activity    Assist        Assist Level: Supervision/Verbal cueing   Wheelchair 150 feet activity     Assist      Assist Level: Supervision/Verbal cueing   Blood pressure 125/67, pulse 80, temperature 97.9 F (36.6 C), temperature source Axillary, resp. rate 16, height  (1.93 m), weight (!) 152.5 kg, SpO2 99 %.  Medical Problem List and Plan: 1. 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  Antithrombotics: -DVT/anticoagulation:  Pharmaceutical: Lovenox  QD             -antiplatelet therapy: N/A 3. Pain Management: Tylenol  TID, Oxycodone 5-10mg  q4h prn. Tramadol  q6h PRN.              -add robaxin  q6h prn for spasms.  -08/23/22 having phantom pain interfering with sleep, will add gabapentin  TID for now, monitor for effect/side effects.  4/15 reports pain controlled -4/17 increase gabapentin to   TID, Increase robaxin dose to  PRN -4/19 reports pain continues to improve, continue current regimen for now -08/30/22  gabapentin dosing never increased, will inc to  TID now since phantom pain still seems to be an issue  4. Mood/Behavior/Sleep: LCSW to follow for evaluation and support.              -antipsychotic agents: N/A -08/23/22 poor sleep, ordered melatonin  QHS, also has trazodone PRN--improved 08/24/22 -4/20-21/24 still not sleeping well, but doesn't want to adjust meds  5. Neuropsych/cognition: This patient is capable of making decisions on his own behalf. 6. Skin/Wound Care: Monitor wound for healing             --limb protector when up -he's post-op day #7. Spoke with  Dr Lajoyce Corners who recommended changing vac to prevena and continuing for another week.  -consider DC vac 4/21 -08/31/22 Prevena machine deactivated, d/c'd, asked nursing to apply kerlex/abd pad/ace wrap dressing, defer to primary team for further wound care orders. Also asked nursing to get TED hose for RLE 7. Fluids/Electrolytes/Nutrition: Monitor I/O. Continue Vitamin C and Zinc and MVI. Monitor routine Monday labs.              --add juven BID additionally -08/23/22 BMP with mild hyponatremia 134, otherwise fairly unremarkable, monitor on routine Monday labs -4/15 Na up to 137, improved 8. T2DM: Hgb A1c- 10.3 and poorly controlled. --dietician has added double portion as well as snacks TID to help with hunger/apptite             --monitor BS ac/hs and titrate insulin as indicated             --continue Insulin glargine 14 units QD and SSI  -4/16 fair control, continue current regimen for now  -4/17 monitor for now, conisder increase glargine   -4/20-21/24 well-controlled, continue to monitor  CBG (last 3)  Recent Labs    08/30/22 1707 08/30/22 2120 08/31/22 0601  GLUCAP 168* 153* 214*    9. Dilated CM of unclear etiology: On Lasix 40mg  QD, Entresto 49-51mg  BID (dose increased today>>d/c'd 4/16) and spironolactone 25mg  QD             --will add low salt restrictions. Monitor daily weights --add low dose KCL due to  recurrent hypokalemia requiring intermittent supplementation --recheck BMET 04/13 and on 04/15 as on spiro -pt is having nausea and ?diarrhea with coreg increase. Will convert him to metoprolol 25mg  bid to start--->titrate as needed. -08/23/22 K+ 3.7; pt educated on med changes and importance of compliance. No weight done today, ordered daily weights.  -08/24/22 no wt again, asked nursing to please make sure daily wt done; slight increase in RLE swelling noted, but mild, monitor for now -4/16 Will contact cardiology regarding DC of entresto -4/18 Seen by cardiology appreciate assistance, increase lopressor to 50mg  BID -08/30/22 wt stable, monitor -08/31/22 wt a little up, mild swelling in RLE, asked for TED hose for now  [facility] Weights   08/29/22 0500 08/30/22 0611 08/31/22 0615  Weight: (!) 151.2 kg (!) 151 kg (!) 152.5 kg    10. HTN: Monitor BP TID--now on Lasix, Entresto and Spironolactone.             -metoprolol as above  -08/23/22 BPs elevated but recent change to meds, monitor for now  -4/15 mildly elevated, monitor for now -4/16 Entresto DC, will consider additional medication if BP remains elevated, will discuss with cardiology  -4/17 Cardiology notified, will contract cardiology again for possible different medication, cardiology plans to discuss with   -4/18 increase lopressor dose to 50mg  twice daily -4/19 blood pressures appear to be improving, continue monitor response -08/30/22 BPs still elevated but gradually improving, monitor trend -08/31/22 BPs variable; monitor trend, may need further adjustments  Vitals:   08/28/22 1937 08/29/22 0602 08/29/22 1612 08/29/22 1938  BP: (!) 147/72 (!) 147/89 (!) 151/81 (!) 153/77   08/29/22 1951 08/30/22 0611 08/30/22 0834 08/30/22 1319  BP: (!) 149/86 (!) 150/84 (!) 160/76 (!) 147/81   08/30/22 1620 08/30/22 1803 08/30/22 2134 08/31/22 0409  BP: (!) 154/92 (!) 172/81 (!) 166/83 125/67    11. Morbid obesity: Continue to educate on diet,  exercise and compliance to help promote overall health and mobility.  12. Anemia:   -08/23/22 HGb 9.2, relatively stable; monitor on routine labs  -4/15 HGB stable at 9.2 13. Constipation: usually goes daily at home  -08/24/22 no BM in 2 days, will start miralax 17g QD for now; monitor  -4/19 last BM today improved  -08/30/22 LBM 2 days ago but doesn't want to change meds; monitor   -08/31/22 LBM yesterday, monitor 14.  Chest pain and nausea, improving  -Check EKG-NSR, Toponin- negative x3 -Pt reports this is related to his use of entresto, will DC this medication, He says he is feeling better now  -4/19 reports this has resolved since stopping Entresto  LOS: 9 days A FACE TO FACE EVALUATION WAS PERFORMED  [address] 08/31/2022, 11:08 AM

## 2022-08-31 NOTE — Progress Notes (Signed)
Conducted focused assessment of heart sounds, lung sounds, and pain level. Pt. has clear lung sounds, heart sounds were regular no abnormalities recorded. Pain medication was given earlier so pain level was 2 on 0-10 scale, having decreased from a higher pain score.   I agree with this student's documentation.

## 2022-08-31 NOTE — Progress Notes (Signed)
Removed wound vac per MD order. Obtained photo of incision site. Patient tolerated well. Introduced figure 8 wrapping with patient and spouse, skin care, desensitization, and keeping knee straight for prosthesis fit. Verbalized understanding.

## 2022-09-01 LAB — CBC
HCT: 30.8 % — ABNORMAL LOW (ref 39.0–52.0)
Hemoglobin: 9 g/dL — ABNORMAL LOW (ref 13.0–17.0)
MCH: 23.2 pg — ABNORMAL LOW (ref 26.0–34.0)
MCHC: 29.2 g/dL — ABNORMAL LOW (ref 30.0–36.0)
MCV: 79.4 fL — ABNORMAL LOW (ref 80.0–100.0)
Platelets: 355 10*3/uL (ref 150–400)
RBC: 3.88 MIL/uL — ABNORMAL LOW (ref 4.22–5.81)
RDW: 19.7 % — ABNORMAL HIGH (ref 11.5–15.5)
WBC: 6.5 10*3/uL (ref 4.0–10.5)
nRBC: 0 % (ref 0.0–0.2)

## 2022-09-01 LAB — GLUCOSE, CAPILLARY
Glucose-Capillary: 136 mg/dL — ABNORMAL HIGH (ref 70–99)
Glucose-Capillary: 109 mg/dL — ABNORMAL HIGH (ref 70–99)
Glucose-Capillary: 176 mg/dL — ABNORMAL HIGH (ref 70–99)
Glucose-Capillary: 143 mg/dL — ABNORMAL HIGH (ref 70–99)

## 2022-09-01 LAB — BASIC METABOLIC PANEL
Anion gap: 8 (ref 5–15)
BUN: 15 mg/dL (ref 6–20)
CO2: 24 mmol/L (ref 22–32)
Calcium: 8 mg/dL — ABNORMAL LOW (ref 8.9–10.3)
Chloride: 104 mmol/L (ref 98–111)
Creatinine, Ser: 1.19 mg/dL (ref 0.61–1.24)
GFR, Estimated: >60 mL/min (ref >60)
Glucose, Bld: 153 mg/dL — ABNORMAL HIGH (ref 70–99)
Potassium: 3.9 mmol/L (ref 3.5–5.1)
Sodium: 136 mmol/L (ref 135–145)

## 2022-09-01 MED ORDER — METOPROLOL TARTRATE 50 MG PO TABS
100.0000 mg | ORAL_TABLET | Freq: Two times a day (BID) | ORAL | Status: DC
  Administered 2022-09-01 – 2022-09-10 (×16): 100 mg via ORAL
  Filled 2022-09-01 (×18): qty 2

## 2022-09-01 NOTE — Progress Notes (Signed)
Physical Therapy Session Note  Patient Details  Name: Alan Tapia. MRN: [id number] Date of Birth: 07/18/1975  Today's Date: 09/01/2022 PT Individual Time: 0802-0850 PT Individual Time Calculation (min): 48 min  PT Individual Time: 4098-1191 PT Individual Time Calculation (min): 43 min   Short Term Goals: Week 1:  PT Short Term Goal 1 (Week 1): Pt will transfer sit to stand w/ mod A. PT Short Term Goal 1 - Progress (Week 1): Progressing toward goal (pt able to perform from elevated mat table but not WC level) PT Short Term Goal 2 (Week 1): Pt will transfer bed<> w/c w/ min A. PT Short Term Goal 2 - Progress (Week 1): Met PT Short Term Goal 3 (Week 1): PT will assess gait. PT Short Term Goal 3 - Progress (Week 1): Progressing toward goal Week 2:  PT Short Term Goal 1 (Week 2): Pt will initiate gait training PT Short Term Goal 2 (Week 2): pt will perform stand pivot transfer with mod A PT Short Term Goal 3 (Week 2): pt will perform sit<>stand from [facility] with mod A  Skilled Therapeutic Interventions/Progress Updates:  First session:  Pt received in [facility] in room, and ready for therapy this AM. Pt self propelled to main gym from room with bil UE and supervision. Pt positioned WC for lateral scoot transfer to EOM, assist provided for leg rest management and CGA for WC>EOM transfer. Mat height adjusted to match WC height, pt unable to stand from this level and mat elevated slightly. Pt required +2 mod assist for first stand to RW. Pt completed 4 sit<>stands total with +2 mod progressing to +2  min assist. Pt attempted side pivots/scoots on Rt LE while standing. Pt managed to pivot ~1" at a time. C/o slight Rt knee pain with first attempt to scoot and improved throughout. Pt admits to fear related to scooting on Rt LE due to low trust in RW for support. Following seated rest EOM after final sit<>stand pt moved sit>supine for LE strengthening. Pt performed 2x10 reps bridging with Lt limb bolstered on  wedge. Therapist blocking Rt LE to ensure positioning. Pt returned supine>sit with supervision and CGA provided with lateral scoot transfer EOM>WC. Pt trialed bring Lt Limb onto mat for support with transfer but this did not aid in stability for him. Pt self propelled back to room with supervision. EOS he remained in [facility], call bell within reach and wife at bedside.   Pain: Lt Limb residual pain "its just starting" with waking up in the morning. Does not rate pain. Reports Rt knee pain with attempting scooting in stand.   Second Session: Pt received in [facility] with spouse at bedside. Pt ready for therapy and reports no significant pain at start of session. Pt self propelled to ortho gym with bil UE, pt able to negotiate tight doorway with supervision and demonstrated good wide turn and awareness of hand/knuckle and elbows to avoid doorframe. Pt positioned WC by EOM and completed set up for [facility] management of leg rests, brakes, and armrest for lateral scoot transfer WC>EOM table. CGA/supervision for transfer. Once seated EOB pt performed LE strengthening. Seated Exercises: - LAQ bil LE: 2x 15 reps, Rt LE with 14lbs,  - Hamstring curl Lt LE 2x 15 reps  Pt scooted posteriorly to move into long sitting and then to supine for additional exercises. Pt rolled to Rt sidelying and then back to supine with supervision. Sidelying Exercises: - Lt hip abduction 2x15 -Lt hip extension 2x15  Supine  Exercises - SLR Lt LE 2x15 - SAQ Rt LE 1x15, 1x15 with 10lbs  Pt moved from supine>long sitting with supervision and scooted anteriorly to EOB. Therapist positioned WC for lateral scoot transfer. CGA/supervision for pt to move EOM>WC. Once in [facility] pt able to manage features to set up armrest, bil leg rests, and breaks. Pt self propelled back to room from ortho gym with supervision. EOS he remained in [facility], call bell within reach and wife at bedside.   Pain: Pt reported phantom pain in Lt foot and "charlie horse" sensation. Pt  able to pat/massage limb and sensation subsided during supine exercises.    Therapy Documentation Precautions:  Precautions Precautions: Fall, Other (comment) (wound vac) Required Braces or Orthoses: Other Brace Other Brace: L limb guard Restrictions Weight Bearing Restrictions: Yes LLE Weight Bearing: Non weight bearing    Therapy/Group: Individual Therapy    Wynn Maudlin, DPT Acute Rehabilitation Services Office [phone]  09/01/22 12:25 PM

## 2022-09-01 NOTE — Progress Notes (Signed)
PROGRESS NOTE   Subjective/Complaints:  Pain patient seen in the gym this morning.  He reports pain is fairly well-controlled.  His Prevena machine has been discontinued.  ROS: +insomnia-improving/fluctuating, + residual limb pain-improving, +phantom pain-controlled, +constipation-improving. Denies fevers, chills, CP, SOB, HA, N/V/D, new/worsening paresthesias/weakness, or any other complaints at this time.     Objective:   No results found. Recent Labs    09/01/22 0543  WBC 6.5  HGB 9.0*  HCT 30.8*  PLT 355    Recent Labs    09/01/22 0543  NA 136  K 3.9  CL 104  CO2 24  GLUCOSE 153*  BUN 15  CREATININE 1.19  CALCIUM 8.0*     Intake/Output Summary (Last 24 hours) at 09/01/2022 1610 Last data filed at 09/01/2022 0700 Gross per 24 hour  Intake 1410 ml  Output 700 ml  Net 710 ml         Physical Exam: Vital Signs Blood pressure (!) 146/91, pulse 84, temperature 98.1 F (36.7 C), temperature source Oral, resp. rate 18, height  (1.93 m), weight (!) 153.2 kg, SpO2 98 %.  Constitutional: No distress . Vital signs reviewed. Laying in bed, appears comfortable HEENT: NCAT, EOMI, oral membranes moist Neck: supple Cardiovascular: RRR without murmur. No JVD    Respiratory/Chest: CTA Bilaterally without wheezes or rales. Normal effort    GI/Abdomen: BS normoactive, NT, ND Ext: no clubbing or cyanosis Psych: pleasant and cooperative, appears to be in good spirits Skin: dry, warm. Chronic changes distal RLE d/t lymphedema, 1+ edema. No sign of breakdown. Left BKA with sleeve.  Neurologic: Alert and oriented x 3. Normal insight and awareness. Intact Memory.  MSK: Chronic lymphedema RLE. LLE in Vac-has been removed, he is wearing limb protector    Assessment/Plan: 1. Functional deficits which require 3+ hours per day of interdisciplinary therapy in a comprehensive inpatient rehab setting. Physiatrist is  providing close team supervision and 24 hour management of active medical problems listed below. Physiatrist and rehab team continue to assess barriers to discharge/monitor patient progress toward functional and medical goals  Care Tool:  Bathing    Body parts bathed by patient: Right arm, Left arm, Chest, Abdomen, Front perineal area, Face, Right upper leg, Left upper leg   Body parts bathed by helper: Right lower leg, Left lower leg, Buttocks Body parts n/a: Left lower leg   Bathing assist Assist Level: Minimal Assistance - Patient > 75%     Upper Body Dressing/Undressing Upper body dressing   What is the patient wearing?: Pull over shirt    Upper body assist Assist Level: Set up assist    Lower Body Dressing/Undressing Lower body dressing      What is the patient wearing?: Pants     Lower body assist Assist for lower body dressing: Minimal Assistance - Patient > 75%     Toileting Toileting Toileting Activity did not occur (Clothing management and hygiene only): N/A (no void or bm)  Toileting assist Assist for toileting: Minimal Assistance - Patient > 75%     Transfers Chair/bed transfer  Transfers assist     Chair/bed transfer assist level: Supervision/Verbal cueing     Locomotion  Ambulation   Ambulation assist   Ambulation activity did not occur: Safety/medical concerns          Walk 10 feet activity   Assist  Walk 10 feet activity did not occur: Safety/medical concerns        Walk 50 feet activity   Assist Walk 50 feet with 2 turns activity did not occur: Safety/medical concerns         Walk 150 feet activity   Assist Walk 150 feet activity did not occur: Safety/medical concerns         Walk 10 feet on uneven surface  activity   Assist Walk 10 feet on uneven surfaces activity did not occur: Safety/medical concerns         Wheelchair     Assist Is the patient using a wheelchair?: Yes Type of Wheelchair: Manual     Wheelchair assist level: Supervision/Verbal cueing Max wheelchair distance: 100    Wheelchair 50 feet with 2 turns activity    Assist        Assist Level: Supervision/Verbal cueing   Wheelchair 150 feet activity     Assist      Assist Level: Supervision/Verbal cueing   Blood pressure (!) 146/91, pulse 84, temperature 98.1 F (36.7 C), temperature source Oral, resp. rate 18, height  (1.93 m), weight (!) 153.2 kg, SpO2 98 %.  Medical Problem List and Plan: 1. 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  Antithrombotics: -DVT/anticoagulation:  Pharmaceutical: Lovenox  QD             -antiplatelet therapy: N/A 3. Pain Management: Tylenol  TID, Oxycodone 5-10mg  q4h prn. Tramadol  q6h PRN.              -add robaxin  q6h prn for spasms.  -08/23/22 having phantom pain interfering with sleep, will add gabapentin  TID for now, monitor for effect/side effects.  4/15 reports pain controlled -4/17 increase gabapentin to   TID, Increase robaxin dose to  PRN -4/19 reports pain continues to improve, continue current regimen for now -08/30/22 gabapentin dosing never increased, will inc to  TID now since phantom pain still seems to be an issue  4. Mood/Behavior/Sleep: LCSW to follow for evaluation and support.              -antipsychotic agents: N/A -08/23/22 poor sleep, ordered melatonin  QHS, also has trazodone PRN--improved 08/24/22 -4/20-21/24 still not sleeping well, but doesn't want to adjust meds  5. Neuropsych/cognition: This patient is capable of making decisions on his own behalf. 6. Skin/Wound Care: Monitor wound for healing             --limb protector when up -he's post-op day #7. Spoke with Dr Lajoyce Corners who recommended  changing vac to prevena and continuing for another week.  -consider DC vac 4/21 -08/31/22 Prevena machine deactivated, d/c'd, asked nursing to apply kerlex/abd pad/ace wrap dressing, defer to primary team for further wound care orders. Also asked nursing to get TED hose for RLE  -4/22 wound VAC has been removed  7. Fluids/Electrolytes/Nutrition: Monitor I/O. Continue Vitamin C and Zinc and MVI. Monitor routine Monday labs.              --  add juven BID additionally -08/23/22 BMP with mild hyponatremia 134, otherwise fairly unremarkable, monitor on routine Monday labs -4/15 Na up to 137, improved -4/22 sodium stable 136 8. T2DM: Hgb A1c- 10.3 and poorly controlled. --dietician has added double portion as well as snacks TID to help with hunger/apptite             --monitor BS ac/hs and titrate insulin as indicated             --continue Insulin glargine 14 units QD and SSI  -4/16 fair control, continue current regimen for now  -4/17 monitor for now, conisder increase glargine   -4/22 well-controlled overall, continue current regimen CBG (last 3)  Recent Labs    08/31/22 1604 08/31/22 2038 09/01/22 0559  GLUCAP 158* 160* 136*     9. Dilated CM of unclear etiology: On Lasix 40mg  QD, Entresto 49-51mg  BID (dose increased today>>d/c'd 4/16) and spironolactone 25mg  QD             --will add low salt restrictions. Monitor daily weights --add low dose KCL due to recurrent hypokalemia requiring intermittent supplementation --recheck BMET 04/13 and on 04/15 as on spiro -pt is having nausea and ?diarrhea with coreg increase. Will convert him to metoprolol 25mg  bid to start--->titrate as needed. -08/23/22 K+ 3.7; pt educated on med changes and importance of compliance. No weight done today, ordered daily weights.  -08/24/22 no wt again, asked nursing to please make sure daily wt done; slight increase in RLE swelling noted, but mild, monitor for now -4/16 Will contact cardiology regarding DC of  entresto -4/18 Seen by cardiology appreciate assistance, increase lopressor to 50mg  BID -08/30/22 wt stable, monitor -08/31/22 wt a little up, mild swelling in RLE, asked for TED hose for now  [facility] Weights   08/30/22 0611 08/31/22 0615 09/01/22 0605  Weight: (!) 151 kg (!) 152.5 kg (!) 153.2 kg    10. HTN: Monitor BP TID--now on Lasix, Entresto and Spironolactone.             -metoprolol as above  -08/23/22 BPs elevated but recent change to meds, monitor for now  -4/15 mildly elevated, monitor for now -4/16 Entresto DC, will consider additional medication if BP remains elevated, will discuss with cardiology  -4/17 Cardiology notified, will contract cardiology again for possible different medication, cardiology plans to discuss with   -4/18 increase lopressor dose to 50mg  twice daily -4/19 blood pressures appear to be improving, continue monitor response -08/30/22  BPs still elevated but gradually improving, monitor trend -4/22  increase lopressor to 100mg  BID, heart rate can likely tolerate this     09/01/2022    6:05 AM 09/01/2022    3:49 AM 08/31/2022    9:17 PM  Vitals with BMI  Weight 337 lbs 12 oz    BMI 41.13    Systolic  146 156  Diastolic  91 90  Pulse  84 86     11. Morbid obesity: Continue to educate on diet, exercise and compliance to help promote overall health and mobility.    12. Anemia:   -08/23/22 HGb 9.2, relatively stable; monitor on routine labs  -4/22 Hgb stable at 9.0 13. Constipation: usually goes daily at home  -08/24/22 no BM in 2 days, will start miralax 17g QD for now; monitor  -4/19 last BM today improved  -08/30/22 LBM 2 days ago but doesn't want to change meds; monitor   -4/22 last BM today 14.  Chest pain and nausea, improving  -  Check EKG-NSR, Toponin- negative x3 -Pt reports this is related to his use of entresto, will DC this medication, He says he is feeling better now  -4/19 reports this has resolved since stopping Entresto  LOS: 10 days A  FACE TO FACE EVALUATION WAS PERFORMED  Fanny Dance 09/01/2022, 8:28 AM

## 2022-09-01 NOTE — Progress Notes (Signed)
Physical Therapy Weekly Progress Note  Patient Details  Name: Alan Tapia. MRN: [id number] Date of Birth: 05-11-1976  Beginning of progress report period: August 23, 2022 End of progress report period: September 01, 2022  Today's Date: 09/01/2022 PT Individual Time: 4098-1191 PT Individual Time Calculation (min): 49 min   Patient has met 3 of 3 short term goals.  Alan Tapia is making great progress toward his PT goals. His main barrier to discharge is an inaccessible home environment and large WC will not fit through home doorways, so he will need to become proficient at stand pivot transfers. He is supervision with lateral scoot transfers, and CGA with standing from elevated surface but inconsistent with standing from wheelchair level with RW. Family education will be completed closer to D/C.   Patient continues to demonstrate the following deficits muscle weakness, decreased cardiorespiratoy endurance, and decreased standing balance, decreased postural control, decreased balance strategies, and difficulty maintaining precautions and therefore will continue to benefit from skilled PT intervention to increase functional independence with mobility.  Patient progressing toward long term goals..  Continue plan of care.  PT Short Term Goals Week 1:  PT Short Term Goal 1 (Week 1): Pt will transfer sit to stand w/ mod A. PT Short Term Goal 1 - Progress (Week 1): Met (pt able to perform from elevated mat table but not WC level) PT Short Term Goal 2 (Week 1): Pt will transfer bed<> w/c w/ min A. PT Short Term Goal 2 - Progress (Week 1): Met PT Short Term Goal 3 (Week 1): PT will assess gait. PT Short Term Goal 3 - Progress (Week 1): Met Week 2:  PT Short Term Goal 1 (Week 2): Pt will initiate gait training PT Short Term Goal 2 (Week 2): pt will perform stand pivot transfer with mod A PT Short Term Goal 3 (Week 2): pt will perform sit<>stand from [facility] with mod A  Skilled Therapeutic  Interventions/Progress Updates:      Pt toileting in bathroom with assist from wife upon arrival. Pt agreeable to therapy. Pt denies any pain. Education provided wife only currently cleared to assist pt with toileting at bedside commode.   Pt transported dependent in [facility] room to main gym for energy conservation. Pt performed lateral scoot transfer WC<> mat table with therapist stabilizing WC. Pt performed sit to stand from mat table slightly higher than WC with CGA with therapist stabilizing RW. Pt performed sit to stand from mat table at height of WC with CGA with therapist stabilizing RW. Pt performed stand pivot transfer WC to mat table with RW and CGA to stabilize RW. Pt attempted sit to stand from [facility] but reports need to use bathroom.   Pt transported dependent in [facility] to room for time conservation. Pt performed roll from WC onto bed, then lateral scoot transfer bed to drop arm commode with supervision. Education provided not to do perform rolling technique as it is not safe, pt verbalized understanding. Pt requesting privacy, therapist stepped out while wife assisted pt on drop arm commode. Pt performed lateral scoot transfer BSC to bed, bed<>WC with supervision and therapist stabilizing wheelchair,and BSC.   Pt transported dependent in [facility] to ortho gym. Pt performed lateral scoot transfer WC to mat table with supervision, with therapist stabilizing WC. Pt attempted x3 sit<>stand from WC height with +2 for safety and to ensure L LE NWBing precautions. Pt performed lateral scoot transfer wheelchair to mat table with supervision, pt performed sit<>stand from mat table lower than  WC with +1 mod A x3. Pt ambulated 3 steps with hop to gait with RW and CGA, verbal cuing provided for technique, pt performed stand pivot transfer to [facility] with CGA. Pt seated in WC at end of session with wife in room and all needs within reach.   Therapy Documentation Precautions:  Precautions Precautions: Fall, Other (comment)  (wound vac) Required Braces or Orthoses: Other Brace Other Brace: L limb guard Restrictions Weight Bearing Restrictions: Yes LLE Weight Bearing: Non weight bearing   Therapy/Group: Individual Therapy  [facility] Ambrose Finland, [place], DPT  09/01/2022, 7:47 AM

## 2022-09-01 NOTE — Progress Notes (Signed)
Occupational Therapy Session Note  Patient Details  Name: Alan Tapia. MRN: [id number] Date of Birth: July 03, 1975  Today's Date: 09/01/2022 OT Individual Time: 0920-1000 OT Individual Time Calculation (min): 40 min    Short Term Goals: Week 2:  OT Short Term Goal 1 (Week 2): Pt will complete a stand pivot transfer with min A using LRAD OT Short Term Goal 2 (Week 2): Pt will complete a sit > stand with CGA consistently from the w/c OT Short Term Goal 3 (Week 2): Pt will complete toileting tasks with CGA overall  Skilled Therapeutic Interventions/Progress Updates:    Pt received sitting with no c/o pain, agreeable to OT session. Wound vac removed yesterday. Inspected limb and ace wrap was not done in figure 8 method. Re-wrapped limb and provided education on ace wrap vs shrinker. He propelled the w/c to the therapy gym with slow pace and complaining of soreness in his R shoulder. He attempted to stand from the w/c with x4 attempts with mod A from OT, pt unable. He completed a lateral scoot ot the mat with (S). He transitioned into supine on the mat and completed modified bridges, sidelying LLE hip extension and abduction, 3x10 repetitions. He required cueing for technique and stabilization at the hip to not compensate with trunk rotation. Activity completed to strengthen glutes and posterior chain for carryover to ADL transfers. He returned to sitting EOB and completed 4x sit <> stands from EOM with min A- mat much higher than w/c. He completed a stand pivot to the w/c with min A. He was left sitting up in his room with his wife present, all needs met.    Therapy Documentation Precautions:  Precautions Precautions: Fall, Other (comment) (wound vac) Required Braces or Orthoses: Other Brace Other Brace: L limb guard Restrictions Weight Bearing Restrictions: Yes LLE Weight Bearing: Non weight bearing  Therapy/Group: Individual Therapy  Crissie Reese 09/01/2022, 6:53 AM

## 2022-09-02 DIAGNOSIS — R52 Pain, unspecified: Secondary | ICD-10-CM

## 2022-09-02 LAB — GLUCOSE, CAPILLARY
Glucose-Capillary: 112 mg/dL — ABNORMAL HIGH (ref 70–99)
Glucose-Capillary: 139 mg/dL — ABNORMAL HIGH (ref 70–99)
Glucose-Capillary: 135 mg/dL — ABNORMAL HIGH (ref 70–99)
Glucose-Capillary: 188 mg/dL — ABNORMAL HIGH (ref 70–99)

## 2022-09-02 MED ORDER — GABAPENTIN 300 MG PO CAPS
300.0000 mg | ORAL_CAPSULE | Freq: Three times a day (TID) | ORAL | Status: DC
  Administered 2022-09-02 – 2022-09-04 (×6): 300 mg via ORAL
  Filled 2022-09-02 (×6): qty 1

## 2022-09-02 MED ORDER — SPIRONOLACTONE 25 MG PO TABS
50.0000 mg | ORAL_TABLET | Freq: Every day | ORAL | Status: DC
  Administered 2022-09-03 – 2022-09-04 (×2): 50 mg via ORAL
  Filled 2022-09-02 (×2): qty 2

## 2022-09-02 NOTE — Progress Notes (Signed)
Occupational Therapy Session Note  Patient Details  Name: Alan Tapia. MRN: [id number] Date of Birth: 1975/11/27  Today's Date: 09/02/2022 OT Individual Time: 1000-1045 OT Individual Time Calculation (min): 45 min    Short Term Goals: Week 2:  OT Short Term Goal 1 (Week 2): Pt will complete a stand pivot transfer with min A using LRAD OT Short Term Goal 2 (Week 2): Pt will complete a sit > stand with CGA consistently from the w/c OT Short Term Goal 3 (Week 2): Pt will complete toileting tasks with CGA overall  Skilled Therapeutic Interventions/Progress Updates:    Patient sitting up in w/c, asking to practice shower bench transfer.  Wife present during session.  Patient provided verbal and visual education on safe technique then patient return demonstrated with min assist at walk in shower level.  Patient self propelled and donned/doffed w/c arm and leg rests independently.  Patient block practiced anterior weight shift with first half of power up needing tactile cues weight shift forward due to hesitant.  Patient unable to power up into full standing due to reported pain in knee.  He reports having prior injuries in right knee in highschool playing football.    Provided wife with measurement handout and instructed to measure bathroom doorway width and bathroom dimensions, and also any other pertinent measurements she has questions regarding.    Therapy Documentation Precautions:  Precautions Precautions: Fall, Other (comment) (wound vac) Required Braces or Orthoses: Other Brace Other Brace: L limb guard Restrictions Weight Bearing Restrictions: Yes LLE Weight Bearing: Non weight bearing    Therapy/Group: Individual Therapy  Amie Critchley 09/02/2022, 12:15 PM

## 2022-09-02 NOTE — Progress Notes (Signed)
Physical Therapy Session Note  Patient Details  Name: Alan Tapia. MRN: [id number] Date of Birth: 1975-06-11  Today's Date: 09/02/2022 PT Individual Time: 0800-0912 PT Individual Time Calculation (min): 72 min   Short Term Goals: Week 2:  PT Short Term Goal 1 (Week 2): Pt ambulate 5 feet with LRAD and mod A PT Short Term Goal 2 (Week 2): pt will perform stand pivot transfer with CGA PT Short Term Goal 3 (Week 2): pt will perform sit<>stand from [facility] level  with mod A  Skilled Therapeutic Interventions/Progress Updates:     Pt seated in [facility] upon arrival with wife in room. Pt agreeable to therapy. Pt reports 3/10 pain in L residual limb, provided seated rest breaks as needed for pain.   Attempted sit<>stand from [facility] x4 with therapist providing verbal cues for technique and mod A, pt demos difficulty performing due to change in mechanics of pushing from [facility] versus mat table.   Pt performed lateral scoot transfer WC to mat, and performed sit<>stand x5 from mat table (lower than WC) with mod A. Pt performed 1x10 heel raises, and 1x10 tricep dips, verbal cues and demo for technique. Pt performed stand pivot x 2 with CGA/min A. Pt hopped x4 with CGA, verbal cues for technique, with limited step length and foot clearance.   Pt performed 1x10 seated clamshell, glute sets, L seated LAQ, hip flexion, hip abduction, and 1x10 R heel/toe raises, LAQ, hip flexion, hip abduction/adduction.  Pt seated in WC at end of session with wife in room and needs within reach.     Therapy Documentation Precautions:  Precautions Precautions: Fall, Other (comment) (wound vac) Required Braces or Orthoses: Other Brace Other Brace: L limb guard Restrictions Weight Bearing Restrictions: Yes LLE Weight Bearing: Non weight bearing   Therapy/Group: Individual Therapy  [facility] [place], [place], DPT  09/02/2022, 7:28 AM

## 2022-09-02 NOTE — Progress Notes (Signed)
Occupational Therapy Session Note  Patient Details  Name: Alan Tapia. MRN: [id number] Date of Birth: 10/17/1975  Today's Date: 09/02/2022 OT Individual Time: 1410-1507 OT Individual Time Calculation (min): 57 min    Short Term Goals: Week 2:  OT Short Term Goal 1 (Week 2): Pt will complete a stand pivot transfer with min A using LRAD OT Short Term Goal 2 (Week 2): Pt will complete a sit > stand with CGA consistently from the w/c OT Short Term Goal 3 (Week 2): Pt will complete toileting tasks with CGA overall  Skilled Therapeutic Interventions/Progress Updates:    Pt received sitting in the w/c with no c/o pain, agreeable to OT session. He propelled the w/c to the therapy gym with intermittent rest breaks. Lateral scoot to the mat with (S). He completed sit > stand with CGA from elevated mat. He completed 1x10 mini squats in standing to address dynamic balance and functional activity tolerance. He transitioned into long sitting and his residual limb was inspected, including removal of the shrinker and gauze. The incision was clean and dry. Skin breakdown on his posterior leg, in a skin fold. Alerted RN to this and she came to observe dressing change- applied non adherent and then shrinker. Provided extensive residual limb education while doing this and he completed several desensitization exercises. He then transitioned into prone and completed 3x10 supermans to strengthen posterior chain. He returned to his w/c and to his room. He was left sitting up with all needs met.    Therapy Documentation Precautions:  Precautions Precautions: Fall, Other (comment) (wound vac) Required Braces or Orthoses: Other Brace Other Brace: L limb guard Restrictions Weight Bearing Restrictions: Yes LLE Weight Bearing: Non weight bearing   Therapy/Group: Individual Therapy  Crissie Reese 09/02/2022, 6:23 AM

## 2022-09-02 NOTE — Progress Notes (Signed)
PROGRESS NOTE   Subjective/Complaints:  Reports increase in phantom pain.  He reports some increased swelling in his R foot, more difficult to get shoes on his foot.   ROS: +insomnia-improving/fluctuating, + residual limb pain-improving, +phantom pain-has been worse recently, +constipation-improving. Denies fevers, chills, CP, SOB, HA, N/V/D, new/worsening paresthesias/weakness, or any other complaints at this time.     Objective:   No results found. Recent Labs    09/01/22 0543  WBC 6.5  HGB 9.0*  HCT 30.8*  PLT 355     Recent Labs    09/01/22 0543  NA 136  K 3.9  CL 104  CO2 24  GLUCOSE 153*  BUN 15  CREATININE 1.19  CALCIUM 8.0*      Intake/Output Summary (Last 24 hours) at 09/02/2022 0829 Last data filed at 09/01/2022 2127 Gross per 24 hour  Intake 237 ml  Output 725 ml  Net -488 ml         Physical Exam: Vital Signs Blood pressure (!) 152/86, pulse 73, temperature 97.8 F (36.6 C), resp. rate 16, height  (1.93 m), weight (!) 155.1 kg, SpO2 99 %.  Constitutional: No distress . Vital signs reviewed. Working in gym with therapy HEENT: NCAT, EOMI, oral membranes moist Neck: supple Cardiovascular: RRR without murmur. No JVD    Respiratory/Chest: CTA Bilaterally without wheezes or rales. Normal effort    GI/Abdomen: BS normoactive, NT, ND Ext: no clubbing or cyanosis Psych: pleasant and cooperative, appears to be in good spirits Skin: dry, warm. Chronic changes distal RLE d/t lymphedema, 1+ edema. No sign of breakdown. Left BKA with limb protector Neurologic: Alert and oriented x 3. Normal insight and awareness. Intact Memory.  MSK: Chronic lymphedema RLE. LLE in Vac-has been removed, he is wearing limb protector    Assessment/Plan: 1. Functional deficits which require 3+ hours per day of interdisciplinary therapy in a comprehensive inpatient rehab setting. Physiatrist is providing close  team supervision and 24 hour management of active medical problems listed below. Physiatrist and rehab team continue to assess barriers to discharge/monitor patient progress toward functional and medical goals  Care Tool:  Bathing    Body parts bathed by patient: Right arm, Left arm, Chest, Abdomen, Front perineal area, Face, Right upper leg, Left upper leg   Body parts bathed by helper: Right lower leg, Left lower leg, Buttocks Body parts n/a: Left lower leg   Bathing assist Assist Level: Minimal Assistance - Patient > 75%     Upper Body Dressing/Undressing Upper body dressing   What is the patient wearing?: Pull over shirt    Upper body assist Assist Level: Set up assist    Lower Body Dressing/Undressing Lower body dressing      What is the patient wearing?: Pants     Lower body assist Assist for lower body dressing: Minimal Assistance - Patient > 75%     Toileting Toileting Toileting Activity did not occur (Clothing management and hygiene only): N/A (no void or bm)  Toileting assist Assist for toileting: Minimal Assistance - Patient > 75%     Transfers Chair/bed transfer  Transfers assist     Chair/bed transfer assist level: Supervision/Verbal cueing  Locomotion Ambulation   Ambulation assist   Ambulation activity did not occur: Safety/medical concerns          Walk 10 feet activity   Assist  Walk 10 feet activity did not occur: Safety/medical concerns        Walk 50 feet activity   Assist Walk 50 feet with 2 turns activity did not occur: Safety/medical concerns         Walk 150 feet activity   Assist Walk 150 feet activity did not occur: Safety/medical concerns         Walk 10 feet on uneven surface  activity   Assist Walk 10 feet on uneven surfaces activity did not occur: Safety/medical concerns         Wheelchair     Assist Is the patient using a wheelchair?: Yes Type of Wheelchair: Manual    Wheelchair  assist level: Supervision/Verbal cueing Max wheelchair distance: 100    Wheelchair 50 feet with 2 turns activity    Assist        Assist Level: Supervision/Verbal cueing   Wheelchair 150 feet activity     Assist      Assist Level: Supervision/Verbal cueing   Blood pressure (!) 152/86, pulse 73, temperature 97.8 F (36.6 C), resp. rate 16, height  (1.93 m), weight (!) 155.1 kg, SpO2 99 %.  Medical Problem List and Plan: 1. 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  Antithrombotics: -DVT/anticoagulation:  Pharmaceutical: Lovenox  QD             -antiplatelet therapy: N/A 3. Pain Management: Tylenol  TID, Oxycodone 5-10mg  q4h prn. Tramadol  q6h PRN.              -add robaxin  q6h prn for spasms.  -08/23/22 having phantom pain interfering with sleep, will add gabapentin  TID for now, monitor for effect/side effects.  4/15 reports pain controlled -4/17 increase gabapentin to   TID, Increase robaxin dose to  PRN -4/19 reports pain continues to improve, continue current regimen for now -08/30/22 gabapentin dosing never increased, will inc to  TID now since  -4/23 increase gabapentin dose to  TID  4. Mood/Behavior/Sleep: LCSW to follow for evaluation and support.              -antipsychotic agents: N/A -08/23/22 poor sleep, ordered melatonin  QHS, also has trazodone PRN--improved 08/24/22 -4/20-21/24 still not sleeping well, but doesn't want to adjust meds  5. Neuropsych/cognition: This patient is capable of making decisions on his own behalf. 6. Skin/Wound Care: Monitor wound for healing             --limb protector when up -he's post-op day #7. Spoke with Dr Lajoyce Corners who recommended changing  vac to prevena and continuing for another week.  -consider DC vac 4/21 -08/31/22 Prevena machine deactivated, d/c'd, asked nursing to apply kerlex/abd pad/ace wrap dressing, defer to primary team for further wound care orders. Also asked nursing to get TED hose for RLE  -4/22 wound VAC has been removed  7. Fluids/Electrolytes/Nutrition: Monitor I/O. Continue Vitamin C and Zinc and MVI. Monitor routine Monday labs.              --  add juven BID additionally -08/23/22 BMP with mild hyponatremia 134, otherwise fairly unremarkable, monitor on routine Monday labs -4/15 Na up to 137, improved -4/22 sodium stable 136  8. T2DM: Hgb A1c- 10.3 and poorly controlled. --dietician has added double portion as well as snacks TID to help with hunger/apptite             --monitor BS ac/hs and titrate insulin as indicated             --continue Insulin glargine 14 units QD and SSI  -4/16 fair control, continue current regimen for now  -4/17 monitor for now, conisder increase glargine   -4/23 well controlled CBG (last 3)  Recent Labs    09/01/22 1630 09/01/22 2121 09/02/22 0611  GLUCAP 176* 143* 112*     9. Dilated CM of unclear etiology: On Lasix 40mg  QD, Entresto 49-51mg  BID (dose increased today>>d/c'd 4/16) and spironolactone 25mg  QD             --will add low salt restrictions. Monitor daily weights --add low dose KCL due to recurrent hypokalemia requiring intermittent supplementation --recheck BMET 04/13 and on 04/15 as on spiro -pt is having nausea and ?diarrhea with coreg increase. Will convert him to metoprolol 25mg  bid to start--->titrate as needed. -08/23/22 K+ 3.7; pt educated on med changes and importance of compliance. No weight done today, ordered daily weights.  -08/24/22 no wt again, asked nursing to please make sure daily wt done; slight increase in RLE swelling noted, but mild, monitor for now -4/16 Will contact cardiology regarding DC of entresto -4/18 Seen by cardiology appreciate  assistance, increase lopressor to 50mg  BID -08/30/22 wt stable, monitor -08/31/22 wt a little up, mild swelling in RLE, asked for TED hose for now -4/23 weight a little higher, increase spironolactone to 50mg  daily, recheck BMP thursday  Filed Weights   08/31/22 0615 09/01/22 0605 09/02/22 0610  Weight: (!) 152.5 kg (!) 153.2 kg (!) 155.1 kg    10. HTN: Monitor BP TID--now on Lasix, Entresto and Spironolactone.             -metoprolol as above  -08/23/22 BPs elevated but recent change to meds, monitor for now  -4/15 mildly elevated, monitor for now -4/16 Entresto DC, will consider additional medication if BP remains elevated, will discuss with cardiology  -4/17 Cardiology notified, will contract cardiology again for possible different medication, cardiology plans to discuss with   -4/18 increase lopressor dose to 50mg  twice daily -4/19 blood pressures appear to be improving, continue monitor response -08/30/22  BPs still elevated but gradually improving, monitor trend -4/22  increase lopressor to 100mg  BID, heart rate can likely tolerate this -4/23 improving, increase spironolactone dose     09/02/2022    6:10 AM 09/01/2022    9:32 PM 09/01/2022    8:09 PM  Vitals with BMI  Weight 341 lbs 15 oz    BMI 41.64    Systolic 152 154 161  Diastolic 86 84 105  Pulse 73 85 92     11. Morbid obesity: Continue to educate on diet, exercise and compliance to help promote overall health and mobility.    12. Anemia:   -08/23/22 HGb 9.2, relatively stable; monitor on routine labs  -4/22 Hgb stable at 9.0 13. Constipation: usually goes daily at home  -08/24/22 no BM in 2 days, will start miralax 17g QD for now; monitor  -4/19 last BM today improved  -08/30/22 LBM 2 days ago but doesn't want to change  meds; monitor   -4/23 LBM yesterday 14.  Chest pain and nausea, improving  -Check EKG-NSR, Toponin- negative x3 -Pt reports this is related to his use of entresto, will DC this medication, He says he  is feeling better now  -4/19 reports this has resolved since stopping Entresto  -Denies further chest pain  LOS: 11 days A FACE TO FACE EVALUATION WAS PERFORMED  Fanny Dance 09/02/2022, 8:29 AM

## 2022-09-03 ENCOUNTER — Inpatient Hospital Stay: Payer: Medicaid - Out of State | Admitting: Nurse Practitioner

## 2022-09-03 LAB — GLUCOSE, CAPILLARY
Glucose-Capillary: 166 mg/dL — ABNORMAL HIGH (ref 70–99)
Glucose-Capillary: 169 mg/dL — ABNORMAL HIGH (ref 70–99)
Glucose-Capillary: 112 mg/dL — ABNORMAL HIGH (ref 70–99)
Glucose-Capillary: 155 mg/dL — ABNORMAL HIGH (ref 70–99)

## 2022-09-03 NOTE — Progress Notes (Signed)
Physical Therapy Session Note  Patient Details  Name: Alan Tapia. MRN: [id number] Date of Birth: July 12, 1975  Today's Date: 09/03/2022 PT Individual Time: 4098-1191 PT Individual Time Calculation (min): 74 min   Short Term Goals: Week 2:  PT Short Term Goal 1 (Week 2): Pt ambulate 5 feet with LRAD and mod A PT Short Term Goal 2 (Week 2): pt will perform stand pivot transfer with CGA PT Short Term Goal 3 (Week 2): pt will perform sit<>stand from [facility] level  with mod A  Skilled Therapeutic Interventions/Progress Updates:  Patient greeted sitting upright in wheelchair with spouse present and agreeable to PT treatment session. Patient propelled manual wheelchair to/from his room and ortho gym with distant supv- Patient required increased time to complete secondary to fatigue. Once in the gym, patient performed lateral transfer to/from wheelchair and mat table with supv- Patient was able to properly set-up the wheelchair and manage parts independently. Therapist and patient spent an extensive amount of time discussing CLOF, projected level of function, home environment, challenges at discharge, exercises to perform while in the room, family support, etc. Patient provided with education regarding need to improve posterior chain strength in order to improve his overall independence with functional mobility. Patient performed all the below exercises and was provided with a HEP to perform in his room when outside of therapy. With all exercises, therapist provided education and TC for improved body positioning, decreased compensatory strategies and improve recruitment of appropriate muscle groups. Patient returned to his room and left sitting upright in wheelchair with call bell within reach and all needs met.   Access Code: [email or web address] URL: [email or web address] Date: 09/03/2022 Prepared by: Sherron Ales Madalee Altmann  Exercises - Prone Hip Extension  - 1 x daily - 7 x weekly - 3 sets - 10 reps -  Prone Hip Extension with Bent Knee  - 1 x daily - 7 x weekly - 3 sets - 10 reps - Sidelying Hip Abduction  - 1 x daily - 7 x weekly - 3 sets - 10 reps - Supine Gluteal Sets  - 1 x daily - 7 x weekly - 3 sets - 20 reps - 3 seconds hold - Single Leg Bridge  - 1 x daily - 7 x weekly - 3 sets - 10 reps   Therapy Documentation Precautions:  Precautions Precautions: Fall, Other (comment) (wound vac) Required Braces or Orthoses: Other Brace Other Brace: L limb guard Restrictions Weight Bearing Restrictions: Yes LLE Weight Bearing: Non weight bearing   Therapy/Group: Individual Therapy  Jaunita Mikels 09/03/2022, 3:08 PM

## 2022-09-03 NOTE — Progress Notes (Signed)
PROGRESS NOTE   Subjective/Complaints:  Pt reports pain control better. Swelling better in RLE, too. Phantom pain present LLE. Pleased with progress overall  ROS: Patient denies fever, rash, sore throat, blurred vision, dizziness, nausea, vomiting, diarrhea, cough, shortness of breath or chest pain, joint or back/neck pain, headache, or mood change. .     Objective:   No results found. Recent Labs    09/01/22 0543  WBC 6.5  HGB 9.0*  HCT 30.8*  PLT 355    Recent Labs    09/01/22 0543  NA 136  K 3.9  CL 104  CO2 24  GLUCOSE 153*  BUN 15  CREATININE 1.19  CALCIUM 8.0*     Intake/Output Summary (Last 24 hours) at 09/03/2022 0837 Last data filed at 09/03/2022 0700 Gross per 24 hour  Intake 1043 ml  Output 500 ml  Net 543 ml        Physical Exam: Vital Signs Blood pressure (!) 149/90, pulse 73, temperature 98.1 F (36.7 C), resp. rate 17, height 6\' 4"  (1.93 m), weight (!) 155.5 kg, SpO2 100 %.  Constitutional: No distress . Vital signs reviewed. HEENT: NCAT, EOMI, oral membranes moist Neck: supple Cardiovascular: RRR without murmur. No JVD    Respiratory/Chest: CTA Bilaterally without wheezes or rales. Normal effort    GI/Abdomen: BS +, non-tender, non-distended Ext: no clubbing, cyanosis, or edema Psych: pleasant and cooperative  Skin: dry, warm. Chronic changes distal RLE d/t lymphedema, 1+ edema. No sign of breakdown. 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 Normal insight and awareness. Intact Memory.  MSK: Chronic lymphedema RLE--size has reduced!Marland Kitchen LLE in  limb protector    Assessment/Plan: 1. Functional deficits which require 3+ hours per day of interdisciplinary therapy in a comprehensive inpatient rehab setting. Physiatrist is providing close team supervision and 24 hour management of active medical problems listed below. Physiatrist and rehab team continue to  assess barriers to discharge/monitor patient progress toward functional and medical goals  Care Tool:  Bathing    Body parts bathed by patient: Right arm, Left arm, Chest, Abdomen, Front perineal area, Face, Right upper leg, Left upper leg   Body parts bathed by helper: Right lower leg, Left lower leg, Buttocks Body parts n/a: Left lower leg   Bathing assist Assist Level: Minimal Assistance - Patient > 75%     Upper Body Dressing/Undressing Upper body dressing   What is the patient wearing?: Pull over shirt    Upper body assist Assist Level: Set up assist    Lower Body Dressing/Undressing Lower body dressing      What is the patient wearing?: Pants     Lower body assist Assist for lower body dressing: Minimal Assistance - Patient > 75%     Toileting Toileting Toileting Activity did not occur (Clothing management and hygiene only): N/A (no void or bm)  Toileting assist Assist for toileting: Minimal Assistance - Patient > 75%     Transfers Chair/bed transfer  Transfers assist     Chair/bed transfer assist level: Supervision/Verbal cueing     Locomotion Ambulation   Ambulation assist   Ambulation activity did not occur: Safety/medical concerns  Walk 10 feet activity   Assist  Walk 10 feet activity did not occur: Safety/medical concerns        Walk 50 feet activity   Assist Walk 50 feet with 2 turns activity did not occur: Safety/medical concerns         Walk 150 feet activity   Assist Walk 150 feet activity did not occur: Safety/medical concerns         Walk 10 feet on uneven surface  activity   Assist Walk 10 feet on uneven surfaces activity did not occur: Safety/medical concerns         Wheelchair     Assist Is the patient using a wheelchair?: Yes Type of Wheelchair: Manual    Wheelchair assist level: Supervision/Verbal cueing Max wheelchair distance: 100    Wheelchair 50 feet with 2 turns  activity    Assist        Assist Level: Supervision/Verbal cueing   Wheelchair 150 feet activity     Assist      Assist Level: Supervision/Verbal cueing   Blood pressure (!) 149/90, pulse 73, temperature 98.1 F (36.7 C), resp. rate 17, height  (1.93 m), weight (!) 155.5 kg, SpO2 100 %.  Medical Problem List and Plan: 1. 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  Antithrombotics: -DVT/anticoagulation:  Pharmaceutical: Lovenox  QD             -antiplatelet therapy: N/A 3. Pain Management: Tylenol  TID, Oxycodone 5-10mg  q4h prn. Tramadol  q6h PRN.              -add robaxin  q6h prn for spasms.  -08/23/22 having phantom pain interfering with sleep, will add gabapentin  TID for now, monitor for effect/side effects.  4/15 reports pain controlled -4/17 increase gabapentin to   TID, Increase robaxin dose to  PRN -4/19 reports pain continues to improve, continue current regimen for now -08/30/22 gabapentin dosing never increased, will inc to  TID now since  -4/23 increased gabapentin dose to  TID 4/24 seems to have less phantom pain today--obsv 4. Mood/Behavior/Sleep: LCSW to follow for evaluation and support.              -antipsychotic agents: N/A -08/23/22 poor sleep, ordered melatonin  QHS, also has trazodone PRN--improved 08/24/22 -4/24--sleep is fair to inconsistent  5. Neuropsych/cognition: This patient is capable of making decisions on his own behalf. 6. Skin/Wound Care: Monitor wound for healing             --limb protector when up -he's post-op day #7. Spoke with Dr Lajoyce Corners who recommended changing vac to prevena and continuing for another week.  -consider DC vac 4/21 -08/31/22 Prevena  machine deactivated, d/c'd, asked nursing to apply kerlex/abd pad/ace wrap dressing, defer to primary team for further wound care orders. Also asked nursing to get TED hose for RLE  -4/22 wound VAC has been removed  7. Fluids/Electrolytes/Nutrition: Monitor I/O. Continue Vitamin C and Zinc and MVI. Monitor routine Monday labs.              --add juven BID additionally -08/23/22 BMP with mild hyponatremia 134, otherwise fairly unremarkable, monitor on routine  Monday labs -4/15 Na up to 137, improved -4/22 sodium stable 136  8. T2DM: Hgb A1c- 10.3 and poorly controlled. --dietician has added double portion as well as snacks TID to help with hunger/apptite             --monitor BS ac/hs and titrate insulin as indicated             --continue Insulin glargine 14 units QD and SSI  -4/16 fair control, continue current regimen for now  -4/17 monitor for now, conisder increase glargine   -4/24 fair control, no changes today CBG (last 3)  Recent Labs    09/02/22 1646 09/02/22 2102 09/03/22 0628  GLUCAP 135* 188* 166*    9. Dilated CM of unclear etiology: On Lasix 40mg  QD, Entresto 49-51mg  BID (dose increased today>>d/c'd 4/16) and spironolactone 25mg  QD             --will add low salt restrictions. Monitor daily weights --add low dose KCL due to recurrent hypokalemia requiring intermittent supplementation --recheck BMET 04/13 and on 04/15 as on spiro -pt is having nausea and ?diarrhea with coreg increase. Will convert him to metoprolol 25mg  bid to start--->titrate as needed. -08/23/22 K+ 3.7; pt educated on med changes and importance of compliance. No weight done today, ordered daily weights.  -08/24/22 no wt again, asked nursing to please make sure daily wt done; slight increase in RLE swelling noted, but mild, monitor for now -4/16 Will contact cardiology regarding DC of entresto -4/18 Seen by cardiology appreciate assistance, increase lopressor to 50mg  BID -08/30/22 wt stable,  monitor -08/31/22 wt a little up, mild swelling in RLE, asked for TED hose for now -4/24 weights trending up.   Spironolactone was increased to 50mg  daily effective TODAY, recheck BMP thursday  -interestingly his RLE lymphedema looks better [facility] Weights   09/01/22 0605 09/02/22 0610 09/03/22 0555  Weight: (!) 153.2 kg (!) 155.1 kg (!) 155.5 kg    10. HTN: Monitor BP TID--now on Lasix, Entresto and Spironolactone.             -metoprolol as above  -08/23/22 BPs elevated but recent change to meds, monitor for now  -4/15 mildly elevated, monitor for now -4/16 Entresto DC, will consider additional medication if BP remains elevated, will discuss with cardiology  -4/17 Cardiology notified, will contract cardiology again for possible different medication, cardiology plans to discuss with   -4/18 increase lopressor dose to 50mg  twice daily -4/19 blood pressures appear to be improving, continue monitor response -08/30/22  BPs still elevated but gradually improving, monitor trend -4/22  increase lopressor to 100mg  BID, heart rate can likely tolerate this -4/23-24 improving, increase spironolactone dose     09/03/2022    5:55 AM 09/02/2022    9:05 PM 09/02/2022    1:19 PM  Vitals with BMI  Weight 342 lbs 13 oz    BMI 41.74    Systolic 149 156 782  Diastolic 90 84 76  Pulse 73 86 74     11. Morbid obesity: Continue to educate on diet, exercise and compliance to help promote overall health and mobility.    12. Anemia:   -08/23/22 HGb 9.2, relatively stable; monitor on routine labs  -4/22 Hgb stable at 9.0 13. Constipation: usually goes daily at home  -08/24/22 no BM in 2 days, will start miralax 17g QD for now; monitor  -4/19 last BM today improved  -08/30/22 LBM 2 days ago but doesn't want to change meds; monitor   -  4/23 LBMs 4/22 14.  Chest pain and nausea, improving  -Check EKG-NSR, Toponin- negative x3 -Pt reports this is related to his use of entresto, will DC this medication, He says  he is feeling better now  -4/19 reports this has resolved since stopping Entresto  -Denies further chest pain  LOS: 12 days A FACE TO FACE EVALUATION WAS PERFORMED  Ranelle Oyster 09/03/2022, 8:37 AM

## 2022-09-03 NOTE — Progress Notes (Signed)
Patient ID: Alan Tapia., male   DOB: [birth year], 47 y.o.   MRN: [id number]  Coverting for primary SW, Becky D  Team Conference Report to [employer] discussion was reviewed with the patient and caregiver, including goals, any changes in plan of care and target discharge date.  Patient and caregiver express understanding and are in agreement.  The patient has a target discharge date of 09/10/22.  SW met with patient and spouse, Alan Tapia. Sw provided team conference updates. Spouse expressed that she has been able to obtains section 8 housing and is currently working on the application. Sw informed spouse if there is any medical information required to please reach out to SW. No additional questions or concerns.  Andria Rhein 09/03/2022, 1:44 PM

## 2022-09-03 NOTE — Patient Care Conference (Signed)
Inpatient RehabilitationTeam Conference and Plan of Care Update Date: 09/03/2022   Time: 12:14 PM    Patient Name: Alan Tapia.      Medical Record Number: [id number]  Date of Birth: 1975/08/28 Sex: Male         Room/Bed: 4M08C/4M08C-01 Payor Info: Payor: MEDICAID OUT OF STATE / Plan: MEDICAID OUT OF STATE / Product Type: *No Product type* /    Admit Date/Time:  08/22/2022  3:18 PM  Primary Diagnosis:  Below-knee amputation of left lower extremity  Hospital Problems: Principal Problem:   Below-knee amputation of left lower extremity Active Problems:   Dilated cardiomyopathy   Acute systolic CHF (congestive heart failure)   Primary hypertension   Adjustment disorder   Phantom pain    Expected Discharge Date: Expected Discharge Date: 09/10/22  Team Members Present:       Current Status/Progress Goal Weekly Team Focus  Bowel/Bladder   cont of bowel and bladder   remain cont   assess toileting needs qshift and prn    Swallow/Nutrition/ Hydration               ADL's   set up UB ADLs, min A LB, min A toileting, (S) lateral scoots, min-mod A from elevated surfaces, largely unable from lower surfaces   mod I   ADLs, transfers, endurance, limb loss edu, d/c planning    Mobility   sit<>stand from WC height with +1 mod A, stand pivot transfer CGA/min A, hopping with RW x3 , but demos limited step length, foot clearance-biggest barrier is home accessibility   sup wheelchair level  progress hopping, progress sit<>stand from [facility], initiate car transfer    Communication                Safety/Cognition/ Behavioral Observations               Pain   c/o surgical pain 3/10   cont to control pain with prn and scheduled pain medications   assess q shift and prn    Skin   BKA incision. Stapels to be removed  Irritation to skin behind knee Cont healing process  will assess q shift and prn  Assess need for different shrinker    Discharge Planning:  Dsicharging  home with spouse. Patient [place] Medicaid active 5/1. Anticipating [facility] and DME once established.   Team Discussion:  Patient post left BKA with HTN, HF, obesity and DM.  Chest discomfort and nausea improved off Entresto.  Patient on target to meet rehab goals: yes, currently needs supervision for upper body care and min assist for lower body using lateral leans. Supervision for lateral scoots and CGA- min assist for stand pivots. Able to hop using a RW. Goals for discharge set for supervision wheelchair mobility, and mod I for self care.  *See Care Plan and progress notes for long and short-term goals.   Revisions to Treatment Plan:  Car transfer practice Residual limb shrinker assessment   Teaching Needs: Safety, transfers, toileting, medications, skin care, dietary modifications,  Current Barriers to Discharge: Decreased caregiver support, Home enviroment access/layout, and Weight bearing restrictions  Possible Resolutions to Barriers: Family education Ramp for entry to home with home environmental updates/handrails, etc. Or change of housing SW to work with patient on access to resources/insurance, etc. [place] medicaid activation set for 09/10/22 with access to [facility] and DME once established.    Medical Summary Current Status: working on pain control, phantom limb pain--gabapentin increased. RLE lymphedema with improvement.  balancing volume/weights still. LLE out of vac  Barriers to Discharge: Medical stability   Possible Resolutions to [employer] Focus: daily assessment of pain and pt's labs and data with adjustments to med regimen as appropriate   Continued Need for Acute Rehabilitation Level of Care: The patient requires daily medical management by a physician with specialized training in physical medicine and rehabilitation for the following reasons: Direction of a multidisciplinary physical rehabilitation program to maximize functional independence : Yes Medical  management of patient stability for increased activity during participation in an intensive rehabilitation regime.: Yes Analysis of laboratory values and/or radiology reports with any subsequent need for medication adjustment and/or medical intervention. : Yes   I attest that I was present, lead the team conference, and concur with the assessment and plan of the team.   Chana Bode B 09/03/2022, 2:11 PM

## 2022-09-03 NOTE — Progress Notes (Signed)
Occupational Therapy Session Note  Patient Details  Name: Alan Tapia. MRN: [id number] Date of Birth: [birth year]  Today's Date: 09/03/2022 OT Individual Time: 6213-0865 OT Individual Time Calculation (min): 74 min    Short Term Goals: Week 2:  OT Short Term Goal 1 (Week 2): Pt will complete a stand pivot transfer with min A using LRAD OT Short Term Goal 2 (Week 2): Pt will complete a sit > stand with CGA consistently from the w/c OT Short Term Goal 3 (Week 2): Pt will complete toileting tasks with CGA overall  Skilled Therapeutic Interventions/Progress Updates:    Pt received supine with no c/o pain, agreeable to OT session. Instructed pt in residual limb management, shrinker care, and desensitization. He required cueing/education for desensitization. He propelled the w/c to the therapy gym with several rest breaks and min cueing for technique. Focus of session on progressing hopping in the parallel bars. Carryover to ADL transfers. He completed sit > stand with B hands on parallel bars with CGA, heavy reliance on UE support and large anterior weight shift. He completed 4x 3-4 hops forward with min-mod A. He required seated rest break between each set. He had 2x full LOB backward- w/c in correct position to prevent fall. He then stood and completed 3x10 tricep extensions to increase independence with sit > stand and support on the RW. He complained of pain in his R latissimus dorsi. Recommend use of heat and he agreed. He came out of the parallel bars and completed 2 sit <> stand from the w/c with heavy mod A and +2 present for w/c and RW support- great improvement today!! He completed 3 hops forward with the RW- mod A overall. He returned to the w/c and to his room. He was left sitting up with all needs met.    Therapy Documentation Precautions:  Precautions Precautions: Fall, Other (comment) (wound vac) Required Braces or Orthoses: Other Brace Other Brace: L limb  guard Restrictions Weight Bearing Restrictions: Yes LLE Weight Bearing: Non weight bearing   Therapy/Group: Individual Therapy  Crissie Reese 09/03/2022, 6:38 AM

## 2022-09-03 NOTE — Plan of Care (Signed)
  Problem: RH Ambulation Goal: LTG Patient will ambulate in controlled environment (PT) Description: LTG: Patient will ambulate in a controlled environment, # of feet with assistance (PT). Outcome: Not Applicable Flowsheets (Taken 09/03/2022 1308) LTG: Ambulation distance in controlled environment: Discontinue goal 4/24 as not appropriate with CLOF

## 2022-09-03 NOTE — Progress Notes (Signed)
Physical Therapy Session Note  Patient Details  Name: Alan Tapia. MRN: [id number] Date of Birth: April 30, 1976  Today's Date: 09/03/2022 PT Individual Time: 1100-1157 PT Individual Time Calculation (min): 57 min   Short Term Goals: Week 2:  PT Short Term Goal 1 (Week 2): Pt ambulate 5 feet with LRAD and mod A PT Short Term Goal 2 (Week 2): pt will perform stand pivot transfer with CGA PT Short Term Goal 3 (Week 2): pt will perform sit<>stand from [facility] level  with mod A  Skilled Therapeutic Interventions/Progress Updates:      Pt seated in WC upon arrival. Pt agreeable to therapy. Pt reports 5/10 pain premedicated. Therapist provided seated rest breaks as needed for fatigue.   Pt transported dependent in [facility] to ortho gym for energy conservation. Pt performed stand pivot transfer to car simulator (at height of truck) with +2 A for sit<>stand and +1 CGA for pivot, with verbal cues for technique.   Pt performed sit<>stand x2 with RW and +1 mod A with therapist standing on L side of pt. Pt hopped x2 (1 forward and 1 backward), and attempted further but unable to clear L LE due to fatigue.   Pt discussed home set up, pt reports in current house pt sleeps on couch as his bedroom is on 3rd floor, pt reports at home he performs lateral scoot transfer from couch to dining room table, and from dining room table to [facility] as dining room table more firm. OT informed PT during session that pt wife reports they have been approved for wheelchair accessible housing. Therapist made decision to hold furniture transfer for now as it may be unnecessary with new house set up.   Pt performed active assisted 1x10 hip flexion, LAQ, hip abduction. Pt self propelled WC from apartment gym to room with supervision.   Pt seated in [facility] with brakes locked, needs within reach and wife in room at end of session.    Therapy Documentation Precautions:  Precautions Precautions: Fall, Other (comment) (wound vac) Required  Braces or Orthoses: Other Brace Other Brace: L limb guard Restrictions Weight Bearing Restrictions: Yes LLE Weight Bearing: Non weight bearing    Therapy/Group: Individual Therapy  [facility] Ambrose Finland, [place], DPT  09/03/2022, 7:32 AM

## 2022-09-04 DIAGNOSIS — R7989 Other specified abnormal findings of blood chemistry: Secondary | ICD-10-CM

## 2022-09-04 LAB — GLUCOSE, CAPILLARY
Glucose-Capillary: 137 mg/dL — ABNORMAL HIGH (ref 70–99)
Glucose-Capillary: 162 mg/dL — ABNORMAL HIGH (ref 70–99)
Glucose-Capillary: 116 mg/dL — ABNORMAL HIGH (ref 70–99)
Glucose-Capillary: 144 mg/dL — ABNORMAL HIGH (ref 70–99)

## 2022-09-04 LAB — BASIC METABOLIC PANEL
Anion gap: 9 (ref 5–15)
BUN: 17 mg/dL (ref 6–20)
CO2: 25 mmol/L (ref 22–32)
Calcium: 8.3 mg/dL — ABNORMAL LOW (ref 8.9–10.3)
Chloride: 105 mmol/L (ref 98–111)
Creatinine, Ser: 1.28 mg/dL — ABNORMAL HIGH (ref 0.61–1.24)
GFR, Estimated: >60 mL/min (ref >60)
Glucose, Bld: 133 mg/dL — ABNORMAL HIGH (ref 70–99)
Potassium: 4 mmol/L (ref 3.5–5.1)
Sodium: 139 mmol/L (ref 135–145)

## 2022-09-04 MED ORDER — GABAPENTIN 400 MG PO CAPS
400.0000 mg | ORAL_CAPSULE | Freq: Three times a day (TID) | ORAL | Status: DC
  Administered 2022-09-04 – 2022-09-10 (×19): 400 mg via ORAL
  Filled 2022-09-04 (×19): qty 1

## 2022-09-04 MED ORDER — ENOXAPARIN SODIUM 80 MG/0.8ML IJ SOSY
80.0000 mg | PREFILLED_SYRINGE | INTRAMUSCULAR | Status: DC
  Administered 2022-09-04 – 2022-09-09 (×6): 80 mg via SUBCUTANEOUS
  Filled 2022-09-04 (×7): qty 0.8

## 2022-09-04 MED ORDER — POTASSIUM CHLORIDE CRYS ER 20 MEQ PO TBCR
30.0000 meq | EXTENDED_RELEASE_TABLET | Freq: Once | ORAL | Status: AC
  Administered 2022-09-04: 30 meq via ORAL
  Filled 2022-09-04: qty 1

## 2022-09-04 MED ORDER — SPIRONOLACTONE 25 MG PO TABS: 25.0000 mg | ORAL_TABLET | Freq: Every day | ORAL | Status: DC

## 2022-09-04 MED ORDER — SPIRONOLACTONE 25 MG PO TABS
25.0000 mg | ORAL_TABLET | Freq: Every day | ORAL | Status: DC
  Administered 2022-09-06 – 2022-09-07 (×2): 25 mg via ORAL
  Filled 2022-09-04 (×2): qty 1

## 2022-09-04 NOTE — Progress Notes (Signed)
Physical Therapy Session Note  Patient Details  Name: Alan Tapia. MRN: [id number] Date of Birth: 12/05/1975  Today's Date: 09/04/2022 PT Individual Time: 1132-1201 PT Individual Time Calculation (min): 29 min   Short Term Goals: Week 1:  PT Short Term Goal 1 (Week 1): Pt will transfer sit to stand w/ mod A. PT Short Term Goal 1 - Progress (Week 1): Met (pt able to perform from elevated mat table but not WC level) PT Short Term Goal 2 (Week 1): Pt will transfer bed<> w/c w/ min A. PT Short Term Goal 2 - Progress (Week 1): Met PT Short Term Goal 3 (Week 1): PT will assess gait. PT Short Term Goal 3 - Progress (Week 1): Met Week 2:  PT Short Term Goal 1 (Week 2): Pt ambulate 5 feet with LRAD and mod A PT Short Term Goal 2 (Week 2): pt will perform stand pivot transfer with CGA PT Short Term Goal 3 (Week 2): pt will perform sit<>stand from [facility] level  with mod A  Skilled Therapeutic Interventions/Progress Updates: Patient in [facility] with spouse present on entrance to room. Patient alert and agreeable to PT session.   Patient reported no pain at beginning of session or throughout. Patient reported feeling fatigue from earlier PT sessions.   Wheelchair Mobility:  Pt propelled wheelchair 100'+ from room to ortho gym, and halfway back to room (PTA transported patient 2/2 B UE fatigue) with supervision. Provided VC for doorway navigation to ortho gym.  Therapeutic Exercise: Pt performed the following exercises with therapist providing the described cuing and facilitation for improvement. - 16 min on UBE in order to increase/maintain cardiovascular endurance during inpatient stay, and to increase B UE strength (resistance set to level 6). Patient performed exercise with no break. Patient cued to ensure breathing.  - R LAQ with patient cued to have slight R hip flexion with yellow theraband - 2 x 10.  Patient left in [facility] at end of session with brakes locked and wife present in room.        Therapy Documentation Precautions:  Precautions Precautions: Fall, Other (comment) (wound vac) Required Braces or Orthoses: Other Brace Other Brace: L limb guard Restrictions Weight Bearing Restrictions: Yes LLE Weight Bearing: Non weight bearing   Therapy/Group: Individual Therapy  Dakiyah Heinke PTA 09/04/2022, 12:54 PM

## 2022-09-04 NOTE — Progress Notes (Signed)
Physical Therapy Session Note  Patient Details  Name: Alan Tapia. MRN: [id number] Date of Birth: June 03, 1975  Today's Date: 09/04/2022 PT Individual Time: 1478-2956 PT Individual Time Calculation (min): 72 min   Short Term Goals: Week 2:  PT Short Term Goal 1 (Week 2): Pt ambulate 5 feet with LRAD and mod A PT Short Term Goal 2 (Week 2): pt will perform stand pivot transfer with CGA PT Short Term Goal 3 (Week 2): pt will perform sit<>stand from [facility] level  with mod A  Skilled Therapeutic Interventions/Progress Updates:      Pt seated in [facility] upon arrival with wife in room. Pt agreeable to therapy. Pt reports 3/10 pain. Therapist provided seated rest breaks, repositioning as needed during session and notified nursing at end of session as pt did not want any pain medicine until end of session.   Pt transported dependent in [facility] to gym for energy conservation. Pt attempted sit<>stand from [facility] with +1 A. Pt performed sit <> stand from [facility] x2 with +2 A.   Pt performed lateral scoot transfer WC<>mat table x2. Pt performed lateral trunk lean x4 to pull up pants on mat table.   Pt performed sit to stand from elevated mat table x3 with therapist stabilizing RW. Pt performed heelraises x10 with verbal cuing provided for tricep extension and gastroc activation versus knee flexion to raise heel; verbal cuing for coordination for combining gastroc activation with tricep extension. Pt amb x2 feet with hop to gait with RW and CGA, with decreased R LE foot clearance, however improved compared to yesterday's session. Pt performed stand pivot transfer x 4 with RW and CGA.   Pt performed sit to stand in // bars with +1 min A. Pt performed 1x10 standing hip abduction and standing hip extension. Pt attempted more sit to stand but fatigued. Pt performed 1x10 WC pushups in // bars.   Pt performed lateral scoot transfer with therapist stabilizing WC.   Pt performed therex to strengthen posterior chain, 1x10 supine  glute bridge with therapist assisting L LE, L hip abduction in R sidelying, prone hip extension.   Pt performed lateral scoot transfer with therapist stabilizing WC.   Pt transported dependent in [facility] to room. Pt seated in WC at end of session with wife in room and all needs in reach at end of session, and pt and wife talking to Child psychotherapist.   Therapy Documentation Precautions:  Precautions Precautions: Fall, Other (comment) (wound vac) Required Braces or Orthoses: Other Brace Other Brace: L limb guard Restrictions Weight Bearing Restrictions: Yes LLE Weight Bearing: Non weight bearing  Therapy/Group: Individual Therapy  [facility] Ambrose Finland, [place], DPT  09/04/2022, 7:41 AM

## 2022-09-04 NOTE — Progress Notes (Addendum)
Patient ID: Alan Tapia., male   DOB: March 13, 1976, 47 y.o.   MRN: [id number]  This SW covering for primary SW, Becky Dupree.   SW saw sticky note indicating pt had a hospital f/u appt on 4/24 at 2:30pm with Bertram Denver, NP at [facility].   SW spoke with [facility] to inform on pt not being a no show and currently inpatient rehab with d/c on 5/1. Appt rescheduled and request to discuss financial assistance and orange card.   Post hospital follow up and to establish primary care scheduled for 09/23/2022 at 2:30 pm with Gwinda Passe NP- [facility] [address] [place],  [name]  [address] [phone]  0940-SW made efforts to make contact with pt wife [name] to follow-up about above appt change and f/u about [place] Medicaid ID# but phone number not working "Unable to complete call as dialed."  *SW went by room and pt with pt and pt wife to inform on above about appt and discuss if wife has been able to get [place] Medicaid ID#. Reports primary SW was to get and she sent to admissions coordinator. SW asked pt if she is able to follow-up with case manager to get [facility] Medicaid ID as she reports that it is active 5/1. SW shared concerns with regard to coverage for DME as there will be co-pays and would not want there to be a lapse in coverage. She states she will follow-up, and SW will explore if there other options.   *OT later came by to report pt wife provided [place] Medicaid ID#, but number is for current policy. SW asked to inform her to call DSS to get this ID#. Contact information provided. Reports DME needed- w/c, hospital bed, and bariatric DABSC.   SW ordered DME: 24x18 w/c (due to weight), hospital bed, and bariatric DABSC with [employer] via parachute.   SW called DSS due to confusion over if he has a Medicaid plan. SW left message with call center and waiting on follow-up.  1352-SW received  message from Ms. McClain/DSS reporting pt assigned CM is Ms. Key [phone].  *SW spoke with Ms. Freada Bergeron with regard to if pt has insurance. States she is his primary CM and his case has been open since 05/2022. He DOES NOT have active MCD, it is pending at this time. Reporting numerous requests and calls have been made to pt wife reporting will need a letter of termination letter from [name] MCD before she can continue with this application. States the case is scheduled to be disposed in 8 days. SW shared [facility] acct notes indicated AOR form was faxed to DSS CM-Pamela Louallen([phone]) by Ms. Jarold Motto with [facility], and a message was left. She has no knowledge of any form received and will follow-up with Pam. Also states it would be helpful if a 5028 form could be faxed to her by [facility] Dept here at [facility]. SW informed will explore options. SW emailed Ms. Livingston Diones for insight on pt case.   [employer] reports unable to use out of state MCD.   [employer] local [place] #[id number] is not active at this time. Possibly will be active 09/10/22.   Cecile Sheerer, MSW, LCSWA Office: [phone] Cell: [phone] Fax: [phone]

## 2022-09-04 NOTE — Progress Notes (Signed)
Patient ID: Alan Tapia., male   DOB: 22-Oct-1975, 47 y.o.   MRN: [id number]  Covering for primary SW, Alan Tapia.   SW met with patient to confirmed home address. Sw informed spouse, Alan Tapia. SW informed spouse and pt once address has officially has changed she will need to inform [employer] of change of address.   Spouse has confirmed that she provided the section 8 case worker with the hospital room contact information to follow up on their status.  DME has been ordered through [employer]. SW explained that policy information would need to be confirmed. No additional questions or concerns.
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
## 2022-09-04 NOTE — Progress Notes (Signed)
Occupational Therapy Session Note  Patient Details  Name: Alan Tapia. MRN: [id number] Date of Birth: [birth year]  Today's Date: 09/04/2022 OT Individual Time: 4098-1191 OT Individual Time Calculation (min): 30 min    Short Term Goals: Week 1:  OT Short Term Goal 1 (Week 1): Pt will complete LB dressing with Min A + LRAD. OT Short Term Goal 1 - Progress (Week 1): Met OT Short Term Goal 2 (Week 1): Pt will complete LB bathing with Min A + LRAD. OT Short Term Goal 2 - Progress (Week 1): Met OT Short Term Goal 3 (Week 1): Pt will complete toilet transfer with CGA + LRAD. OT Short Term Goal 3 - Progress (Week 1): Met Week 2:  OT Short Term Goal 1 (Week 2): Pt will complete a stand pivot transfer with min A using LRAD OT Short Term Goal 2 (Week 2): Pt will complete a sit > stand with CGA consistently from the w/c OT Short Term Goal 3 (Week 2): Pt will complete toileting tasks with CGA overall Week 3:     Skilled Therapeutic Interventions/Progress Updates:    1:1 Pt and pt's wife present for therapy. Education and problem solving on d/c plans home. Pt wants to be able to access laundry room. Pt unable to access room via w/c and not safe to hop In there at this point. Pt decided best for wife to assist.   Discussed sleeping options and pt reports his couch is super low and probably will not be able to get up from that surface. Also problem solved place and how to set up for bathing and dressing.w/c will not be able to access power room downstairs. Decided could do from w/c with bedside table or from EOB  Recommend a hospital bed for home to be able to transfer to from w/c and to and from [facility] at a height that is accessible to pt and to help caregiver/ wife to better be able to assist with care of patient for his BADLs.   Measured 22x18 w/c and is 31 inches wide; wife to measure at home to see the width of front door to determine access point.   Also recommend drop arm BSC to be able to  void outside of bathroom due to no access and needing a higher surface to transfer off/ on.  Pt left sitting in w/c with wife and call bell at side.   Therapy Documentation Precautions:  Precautions Precautions: Fall, Other (comment) (wound vac) Required Braces or Orthoses: Other Brace Other Brace: L limb guard Restrictions Weight Bearing Restrictions: Yes LLE Weight Bearing: Non weight bearing  Pain: Pain Assessment Pain Scale: 0-10 Pain Score: 0-No pain   Therapy/Group: Individual Therapy  Roney Mans [facility] 09/04/2022, 1:20 PM

## 2022-09-04 NOTE — Progress Notes (Signed)
PROGRESS NOTE   Subjective/Complaints:  Continues to have phantom pain in LLE. Continues to have RLE swelling- appears unchanged from earlier this week. Making progress with therapy.   ROS: Patient denies fever, rash, sore throat, blurred vision, dizziness, nausea, vomiting, diarrhea, abdominal pain, cough, shortness of breath or chest pain, joint or back/neck pain, headache, or mood change. .     Objective:   No results found. No results for input(s): "WBC", "HGB", "HCT", "PLT" in the last 72 hours.   Recent Labs    09/04/22 0551  NA 139  K 4.0  CL 105  CO2 25  GLUCOSE 133*  BUN 17  CREATININE 1.28*  CALCIUM 8.3*     Intake/Output Summary (Last 24 hours) at 09/04/2022 0829 Last data filed at 09/04/2022 0800 Gross per 24 hour  Intake 360 ml  Output 1300 ml  Net -940 ml         Physical Exam: Vital Signs Blood pressure (!) 151/85, pulse 76, temperature (!) 97.5 F (36.4 C), resp. rate 18, height  (1.93 m), weight (!) 156.5 kg, SpO2 99 %.  Constitutional: No distress . Vital signs reviewed. Working with therapy in gym HEENT: NCAT, EOMI, oral membranes moist Neck: supple Cardiovascular: RRR without murmur. No JVD    Respiratory/Chest: CTA Bilaterally without wheezes or rales. Normal effort    GI/Abdomen: BS +, non-tender, non-distended Ext: no clubbing, cyanosis, or edema Psych: pleasant and cooperative  Skin: dry, warm. Chronic changes distal RLE d/t lymphedema, 1+ edema. No sign of breakdown. 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 Normal insight and awareness. Intact Memory.  MSK: Chronic lymphedema RLE- appears similar to earlier this week, LLE in  limb protector    Assessment/Plan: 1. Functional deficits which require 3+ hours per day of interdisciplinary therapy in a comprehensive inpatient rehab setting. Physiatrist is providing close team supervision and 24  hour management of active medical problems listed below. Physiatrist and rehab team continue to assess barriers to discharge/monitor patient progress toward functional and medical goals  Care Tool:  Bathing    Body parts bathed by patient: Right arm, Left arm, Chest, Abdomen, Front perineal area, Face, Right upper leg, Left upper leg   Body parts bathed by helper: Right lower leg, Left lower leg, Buttocks Body parts n/a: Left lower leg   Bathing assist Assist Level: Minimal Assistance - Patient > 75%     Upper Body Dressing/Undressing Upper body dressing   What is the patient wearing?: Pull over shirt    Upper body assist Assist Level: Set up assist    Lower Body Dressing/Undressing Lower body dressing      What is the patient wearing?: Pants     Lower body assist Assist for lower body dressing: Minimal Assistance - Patient > 75%     Toileting Toileting Toileting Activity did not occur (Clothing management and hygiene only): N/A (no void or bm)  Toileting assist Assist for toileting: Minimal Assistance - Patient > 75%     Transfers Chair/bed transfer  Transfers assist     Chair/bed transfer assist level: Supervision/Verbal cueing     Locomotion Ambulation  Ambulation assist   Ambulation activity did not occur: Safety/medical concerns          Walk 10 feet activity   Assist  Walk 10 feet activity did not occur: Safety/medical concerns        Walk 50 feet activity   Assist Walk 50 feet with 2 turns activity did not occur: Safety/medical concerns         Walk 150 feet activity   Assist Walk 150 feet activity did not occur: Safety/medical concerns         Walk 10 feet on uneven surface  activity   Assist Walk 10 feet on uneven surfaces activity did not occur: Safety/medical concerns         Wheelchair     Assist Is the patient using a wheelchair?: Yes Type of Wheelchair: Manual    Wheelchair assist level:  Supervision/Verbal cueing Max wheelchair distance: 100    Wheelchair 50 feet with 2 turns activity    Assist        Assist Level: Supervision/Verbal cueing   Wheelchair 150 feet activity     Assist      Assist Level: Supervision/Verbal cueing   Blood pressure (!) 151/85, pulse 76, temperature (!) 97.5 F (36.4 C), resp. rate 18, height 6\' 4"  (1.93 m), weight (!) 156.5 kg, SpO2 99 %.  Medical Problem List and Plan: 1. 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  Antithrombotics: -DVT/anticoagulation:  Pharmaceutical: Lovenox 75mg  QD             -antiplatelet therapy: N/A 3. Pain Management: Tylenol 650mg  TID, Oxycodone 5-10mg  q4h prn. Tramadol 50mg  q6h PRN.              -add robaxin 500mg  q6h prn for spasms.  -08/23/22 having phantom pain interfering with sleep, will add gabapentin 100mg  TID for now, monitor for effect/side effects.  4/15 reports pain controlled -4/17 increase gabapentin to  200mg  TID, Increase robaxin dose to 750mg  PRN -4/19 reports pain continues to improve, continue current regimen for now -08/30/22 gabapentin dosing never increased, will inc to 200mg  TID now since  -4/23 increased gabapentin dose to 300mg  TID 4/25 Increase gabapentin to 400mg  TID 4. Mood/Behavior/Sleep: LCSW to follow for evaluation and support.              -antipsychotic agents: N/A -08/23/22 poor sleep, ordered melatonin 5mg  QHS, also has trazodone PRN--improved 08/24/22 -4/24--sleep is fair to inconsistent  5. Neuropsych/cognition: This patient is capable of making decisions on his own behalf. 6. Skin/Wound Care: Monitor wound for healing              --limb protector when up -he's post-op day #7. Spoke with Dr Lajoyce Corners who recommended changing vac to prevena and continuing for another week.  -consider DC vac 4/21 -08/31/22 Prevena machine deactivated, d/c'd, asked nursing to apply kerlex/abd pad/ace wrap dressing, defer to primary team for further wound care orders. Also asked nursing to get TED hose for RLE  -4/22 wound VAC has been removed  7. Fluids/Electrolytes/Nutrition: Monitor I/O. Continue Vitamin C  and Zinc and MVI. Monitor routine Monday labs.              --add juven BID additionally -08/23/22 BMP with mild hyponatremia 134, otherwise fairly unremarkable, monitor on routine Monday labs -4/15 Na up to 137, improved -4/25 Na stable at 139  8. T2DM: Hgb A1c- 10.3 and poorly controlled. --dietician has added double portion as well as snacks TID to help with hunger/apptite             --monitor BS ac/hs and titrate insulin as indicated             --continue Insulin glargine 14 units QD and SSI  -4/16 fair control, continue current regimen for now  -4/17 monitor for now, conisder increase glargine   -4/25 Controlled, continue current CBG (last 3)  Recent Labs    09/03/22 1624 09/03/22 2049 09/04/22 0618  GLUCAP 112* 155* 137*     9. Dilated CM of unclear etiology: On Lasix  QD, Entresto 49-51mg  BID (dose increased today>>d/c'd 4/16) and spironolactone  QD             --will add low salt restrictions. Monitor daily weights --add low dose KCL due to recurrent hypokalemia requiring intermittent supplementation --recheck BMET 04/13 and on 04/15 as on spiro -pt is having nausea and ?diarrhea with coreg increase. Will convert him to metoprolol  bid to start--->titrate as needed. -08/23/22 K+ 3.7; pt educated on med changes and importance of compliance. No weight done today, ordered daily weights.  -08/24/22 no wt again, asked nursing to please make sure daily wt done; slight increase in RLE swelling noted, but mild,  monitor for now -4/16 Will contact cardiology regarding DC of entresto -4/18 Seen by cardiology appreciate assistance, increase lopressor to  BID -08/30/22 wt stable, monitor -08/31/22 wt a little up, mild swelling in RLE, asked for TED hose for now -4/24 weights trending up.   Spironolactone was increased to  daily effective TODAY, recheck BMP thursday  -interestingly his RLE lymphedema looks better -4/25 Will decrease spironolactone back to  daily, hold tomorrow, will ask for wts to be checked standing -4/25 will attempt to Keep potassium >4, give 1x dose today to help keep optimal since will hold spironolactone, advised leg elevation Filed Weights   09/02/22 0610 09/03/22 0555 09/04/22 0536  Weight: (!) 155.1 kg (!) 155.5 kg (!) 156.5 kg    10. HTN: Monitor BP TID--now on Lasix, Entresto and Spironolactone.             -metoprolol as above  -08/23/22 BPs elevated but recent change to meds, monitor for now  -4/15 mildly elevated, monitor for now -4/16 Entresto DC, will consider additional medication if BP remains elevated, will discuss with cardiology  -4/17 Cardiology notified, will contract cardiology again for possible different medication, cardiology plans to discuss with   -4/18 increase lopressor dose to  twice daily -4/19 blood pressures appear to be improving, continue monitor response -08/30/22  BPs still elevated but gradually improving, monitor trend -4/22  increase lopressor to  BID, heart rate can likely tolerate this -4/25 Fair control, continue to monitor trend     09/04/2022    5:36 AM 09/03/2022    7:57 PM 09/03/2022    1:15 PM  Vitals with BMI  Weight 345 lbs 2 oz    BMI 42.02    Systolic 151 166 045  Diastolic 85 95 83  Pulse 76 84 77     11. Morbid obesity: Continue  to educate on diet, exercise and compliance to help promote overall health and mobility.    12. Anemia:   -08/23/22 HGb 9.2, relatively stable; monitor on routine labs  -4/22  Hgb stable at 9.0 13. Constipation: usually goes daily at home  -08/24/22 no BM in 2 days, will start miralax 17g QD for now; monitor  -4/19 last BM today improved  -08/30/22 LBM 2 days ago but doesn't want to change meds; monitor   -4/23 LBMs 4/22 14.  Chest pain and nausea, improving  -Check EKG-NSR, Toponin- negative x3 -Pt reports this is related to his use of entresto, will DC this medication, He says he is feeling better now  -4/19 reports this has resolved since stopping Entresto  -Denies further chest pain 15. Azotemia  -4/25 Decrease spironolactone back down to 25mg , hold dose tomorrow    LOS: 13 days A FACE TO FACE EVALUATION WAS PERFORMED  Fanny Dance 09/04/2022, 8:29 AM

## 2022-09-05 DIAGNOSIS — M7989 Other specified soft tissue disorders: Secondary | ICD-10-CM

## 2022-09-05 DIAGNOSIS — R0989 Other specified symptoms and signs involving the circulatory and respiratory systems: Secondary | ICD-10-CM

## 2022-09-05 LAB — GLUCOSE, CAPILLARY
Glucose-Capillary: 101 mg/dL — ABNORMAL HIGH (ref 70–99)
Glucose-Capillary: 148 mg/dL — ABNORMAL HIGH (ref 70–99)
Glucose-Capillary: 112 mg/dL — ABNORMAL HIGH (ref 70–99)
Glucose-Capillary: 137 mg/dL — ABNORMAL HIGH (ref 70–99)
Glucose-Capillary: 161 mg/dL — ABNORMAL HIGH (ref 70–99)

## 2022-09-05 LAB — BASIC METABOLIC PANEL
Anion gap: 5 (ref 5–15)
BUN: 19 mg/dL (ref 6–20)
CO2: 24 mmol/L (ref 22–32)
Calcium: 8 mg/dL — ABNORMAL LOW (ref 8.9–10.3)
Chloride: 108 mmol/L (ref 98–111)
Creatinine, Ser: 1.32 mg/dL — ABNORMAL HIGH (ref 0.61–1.24)
GFR, Estimated: >60 mL/min (ref >60)
Glucose, Bld: 116 mg/dL — ABNORMAL HIGH (ref 70–99)
Potassium: 4 mmol/L (ref 3.5–5.1)
Sodium: 137 mmol/L (ref 135–145)

## 2022-09-05 MED ORDER — INSULIN GLARGINE-YFGN 100 UNIT/ML ~~LOC~~ SOLN
12.0000 [IU] | Freq: Every day | SUBCUTANEOUS | Status: DC
  Administered 2022-09-06 – 2022-09-10 (×5): 12 [IU] via SUBCUTANEOUS
  Filled 2022-09-05 (×6): qty 0.12

## 2022-09-05 NOTE — Progress Notes (Signed)
Occupational Therapy Session Note  Patient Details  Name: Alan Tapia. MRN: [id number] Date of Birth: 03/21/1976  Today's Date: 09/05/2022 OT Individual Time: 1305-1410 OT Individual Time Calculation (min): 65 min    Short Term Goals: Week 3:  OT Short Term Goal 1 (Week 3): Pt will complete sit > stand from the w/c with min A OT Short Term Goal 2 (Week 3): Pt will complete all toileting tasks with CGA OT Short Term Goal 3 (Week 3): Pt will demonstrate carryover of all residual limb care with (S)  Skilled Therapeutic Interventions/Progress Updates:    Pt received sitting with un-rated pain in his residual limb, agreeable to OT session. Reports he is premedicated and needs only rest breaks. He completed wc propulsion 100 ft to the therapy gym with intermittent rest breaks, self monitored, 2/2 fatigue. He stood from the w/c with x3 attempts with great power up, lacking forward weight translation. Mod A to come to upright. Once in standing he was able to maintain static standing with CGA. He attempted to hop forward several times but was unable. 2 small hops backward with min A and very close w/c follow from +2. Lateral scoot to the mat with (S). He completed 3x10 modified sit ups to strengthen core stability for carryover to ADL transfers. He then completed endurance based UE activity, holding a 4lb ball for an extended period of time to strengthen for carryover to UE support on the RW and improved power up from the mat. Lastly he worked on static standing with UE removal from the RW to challenge balance and endurance in standing. He completed stand > sit with heavy landing. He completed a stand pivot transfer to the w/c with x3 attempts to stand, min A to the w/c with the HDRW. He returned to the w/c and was left sitting up with all needs met. Coordinated fit of shrinker with orthotics rep [name]- will try extra long AKA shrinker for improved fit.    Therapy Documentation Precautions:   Precautions Precautions: Fall, Other (comment) (wound vac) Required Braces or Orthoses: Other Brace Other Brace: L limb guard Restrictions Weight Bearing Restrictions: Yes LLE Weight Bearing: Non weight bearing   Therapy/Group: Individual Therapy  Crissie Reese 09/05/2022, 1:25 PM

## 2022-09-05 NOTE — Progress Notes (Signed)
Patient's Semglee adjusted by MD because of hypoglycemic symptoms this morning.  Patient refusing to take Metoprolol - reported to MD and PA. MD will investigate alternatives per patient request. Will continue to monitor.

## 2022-09-05 NOTE — Progress Notes (Signed)
Patient ID: Alan Tapia., male   DOB: 04-20-1976, 47 y.o.   MRN: [id number]  This SW covering for primary SW, Becky Dupree.   Due to pt being uninsured. DME ordered under charity with [employer].   Cecile Sheerer, MSW, LCSWA Office: [phone] Cell: [phone] Fax: [phone]

## 2022-09-05 NOTE — Progress Notes (Signed)
Patient refused metoprolol due to complaints of swelling of left lower leg. Stump shrinker removed as it was tight on his stump. Foot of the bed elevated for now to help decrease swelling. Will monitor.

## 2022-09-05 NOTE — Progress Notes (Signed)
RN called to see patient in the room. Per patient he feels his blood sugar is low. AOx4, denies any chest pain and shortness of breath. VItals taken, see flowsheet. Blood sugar 101. 2 Orange juice given post breakfast. Rechecked blood sugar it was 148. Patient feels a little better. Report given to day shift rn.

## 2022-09-05 NOTE — Progress Notes (Signed)
PROGRESS NOTE   Subjective/Complaints:  Patient reports his phantom pain is a little bit improved.  He reports continued swelling in his lower extremities.  Patient also reported hypoglycemic symptoms earlier, although blood glucose was 101 at the time.  He has been refusing to take metoprolol because he feels like this causes his legs to swell.   ROS: Patient denies fever, rash, sore throat, blurred vision, dizziness, nausea, vomiting, diarrhea, abdominal pain, cough, shortness of breath or chest pain, joint or back/neck pain, headache, or mood change. .   + Phantom pain, leg swelling   Objective:   No results found. No results for input(s): "WBC", "HGB", "HCT", "PLT" in the last 72 hours.   Recent Labs    09/04/22 0551 09/05/22 0621  NA 139 137  K 4.0 4.0  CL 105 108  CO2 25 24  GLUCOSE 133* 116*  BUN 17 19  CREATININE 1.28* 1.32*  CALCIUM 8.3* 8.0*      Intake/Output Summary (Last 24 hours) at 09/05/2022 1610 Last data filed at 09/04/2022 1817 Gross per 24 hour  Intake 720 ml  Output 800 ml  Net -80 ml         Physical Exam: Vital Signs Blood pressure (!) 161/96, pulse 85, temperature 97.6 F (36.4 C), resp. rate 14, height 6\' 4"  (1.93 m), weight (!) 158.7 kg, SpO2 100 %.  Constitutional: No distress . Vital signs reviewed. Working with therapy in gym HEENT: NCAT, EOMI, oral membranes moist Neck: supple Cardiovascular: RRR without murmur. No JVD    Respiratory/Chest: CTA Bilaterally without wheezes or rales. Normal effort    GI/Abdomen: BS +, non-tender, non-distended Ext: no clubbing, cyanosis, or edema Psych: pleasant and cooperative  Skin: dry, warm. Chronic changes distal RLE d/t lymphedema, 1+ edema. No sign of breakdown. 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 Normal insight and awareness. Intact Memory.  MSK: Chronic lymphedema RLE- appears similar to earlier  this week, LLE in  limb protector    Assessment/Plan: 1. Functional deficits which require 3+ hours per day of interdisciplinary therapy in a comprehensive inpatient rehab setting. Physiatrist is providing close team supervision and 24 hour management of active medical problems listed below. Physiatrist and rehab team continue to assess barriers to discharge/monitor patient progress toward functional and medical goals  Care Tool:  Bathing    Body parts bathed by patient: Right arm, Left arm, Chest, Abdomen, Front perineal area, Face, Right upper leg, Left upper leg   Body parts bathed by helper: Right lower leg, Left lower leg, Buttocks Body parts n/a: Left lower leg   Bathing assist Assist Level: Minimal Assistance - Patient > 75%     Upper Body Dressing/Undressing Upper body dressing   What is the patient wearing?: Pull over shirt    Upper body assist Assist Level: Set up assist    Lower Body Dressing/Undressing Lower body dressing      What is the patient wearing?: Pants     Lower body assist Assist for lower body dressing: Minimal Assistance - Patient > 75%     Toileting Toileting Toileting Activity did not occur (Clothing management and hygiene only):  N/A (no void or bm)  Toileting assist Assist for toileting: Minimal Assistance - Patient > 75%     Transfers Chair/bed transfer  Transfers assist     Chair/bed transfer assist level: Supervision/Verbal cueing     Locomotion Ambulation   Ambulation assist   Ambulation activity did not occur: Safety/medical concerns          Walk 10 feet activity   Assist  Walk 10 feet activity did not occur: Safety/medical concerns        Walk 50 feet activity   Assist Walk 50 feet with 2 turns activity did not occur: Safety/medical concerns         Walk 150 feet activity   Assist Walk 150 feet activity did not occur: Safety/medical concerns         Walk 10 feet on uneven surface   activity   Assist Walk 10 feet on uneven surfaces activity did not occur: Safety/medical concerns         Wheelchair     Assist Is the patient using a wheelchair?: Yes Type of Wheelchair: Manual    Wheelchair assist level: Supervision/Verbal cueing Max wheelchair distance: 100    Wheelchair 50 feet with 2 turns activity    Assist        Assist Level: Supervision/Verbal cueing   Wheelchair 150 feet activity     Assist      Assist Level: Supervision/Verbal cueing   Blood pressure (!) 161/96, pulse 85, temperature 97.6 F (36.4 C), resp. rate 14, height 6\' 4"  (1.93 m), weight (!) 158.7 kg, SpO2 100 %.  Medical Problem List and Plan: 1. 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  Antithrombotics: -DVT/anticoagulation:  Pharmaceutical: Lovenox 75mg  QD             -antiplatelet therapy: N/A 3. Pain Management: Tylenol 650mg  TID, Oxycodone 5-10mg  q4h prn. Tramadol 50mg  q6h PRN.              -add robaxin 500mg  q6h prn for spasms.  -08/23/22 having phantom pain interfering with sleep, will add gabapentin 100mg  TID for now, monitor for effect/side effects.  4/15 reports pain controlled -4/17 increase gabapentin to  200mg  TID, Increase robaxin dose to 750mg  PRN -4/19 reports pain continues to improve, continue current regimen for now -08/30/22 gabapentin dosing never increased, will inc to 200mg  TID now since  -4/23 increased gabapentin dose to 300mg  TID 4/25 Increase gabapentin to 400mg  TID  Improving pain  4. Mood/Behavior/Sleep: LCSW to follow  for evaluation and support.              -antipsychotic agents: N/A -08/23/22 poor sleep, ordered melatonin 5mg  QHS, also has trazodone PRN--improved 08/24/22 -4/24--sleep is fair to inconsistent  5. Neuropsych/cognition: This patient is capable of making decisions on his own behalf. 6. Skin/Wound Care: Monitor wound for healing             --limb protector when up -he's post-op day #7. Spoke with Dr Lajoyce Corners who recommended  changing vac to prevena and continuing for another week.  -consider DC vac 4/21 -08/31/22 Prevena machine deactivated, d/c'd, asked nursing to apply kerlex/abd pad/ace wrap dressing, defer to primary team for further wound care orders. Also asked nursing to get TED hose for RLE  -4/22 wound VAC has been removed  7. Fluids/Electrolytes/Nutrition: Monitor I/O. Continue Vitamin C and Zinc and MVI. Monitor routine Monday labs.              --add juven BID additionally -08/23/22 BMP with mild hyponatremia 134, otherwise fairly unremarkable, monitor on routine Monday labs -4/15 Na up to 137, improved -4/25 Na stable at 139  8. T2DM: Hgb A1c- 10.3 and poorly controlled. --dietician has added double portion as well as snacks TID to help with hunger/apptite             --monitor BS ac/hs and titrate insulin as indicated             --continue Insulin glargine 14 units QD and SSI  -4/16 fair control, continue current regimen for now  -4/17 monitor for now, conisder increase glargine   -4/26 patient reports hypoglycemic symptoms when glucose was 101 this morning, will decrease glargine to 12 units CBG (last 3)  Recent Labs    09/04/22 2044 09/05/22 0628 09/05/22 0730  GLUCAP 144* 101* 148*     9. Dilated CM of unclear etiology: On Lasix 40mg  QD, Entresto 49-51mg  BID (dose increased today>>d/c'd 4/16) and spironolactone 25mg  QD             --will add low salt restrictions. Monitor daily weights --add low dose KCL due to recurrent hypokalemia requiring intermittent  supplementation --recheck BMET 04/13 and on 04/15 as on spiro -pt is having nausea and ?diarrhea with coreg increase. Will convert him to metoprolol 25mg  bid to start--->titrate as needed. -08/23/22 K+ 3.7; pt educated on med changes and importance of compliance. No weight done today, ordered daily weights.  -08/24/22 no wt again, asked nursing to please make sure daily wt done; slight increase in RLE swelling noted, but mild, monitor for now -4/16 Will contact cardiology regarding DC of entresto -4/18 Seen by cardiology appreciate assistance, increase lopressor to 50mg  BID -08/30/22 wt stable, monitor -08/31/22 wt a little up, mild swelling in RLE, asked for TED hose for now -4/24 weights trending up.   Spironolactone was increased to 50mg  daily effective TODAY, recheck BMP thursday  -interestingly his RLE lymphedema looks better -4/25 Will decrease spironolactone back to 25mg  daily, hold tomorrow, will ask for wts to be checked standing if possible -4/25 will attempt to Keep potassium >4, give 1x dose today to help keep optimal since will hold spironolactone, advised leg elevation -4/26 will contact cardiology regarding recommendations, weight does appear increased although patient says they have been weighing him with heavy blankets.  Gabapentin could be possible cause of leg swelling also however will hold off on making changes to this currently.  Filed Weights   09/03/22 0555 09/04/22 0536 09/05/22 0536  Weight: (!) 155.5 kg (!) 156.5 kg (!) 158.7 kg    10. HTN: Monitor BP TID--now on Lasix, Entresto and Spironolactone.             -metoprolol as above  -08/23/22 BPs elevated but recent change to meds, monitor for now  -4/15 mildly elevated, monitor for now -4/16 Entresto DC, will consider additional medication if BP remains elevated, will discuss with cardiology  -4/17 Cardiology notified, will contract cardiology again for possible different medication,  cardiology plans to discuss with    -4/18 increase lopressor dose to 50mg  twice daily -4/19 blood pressures appear to be improving, continue monitor response -08/30/22  BPs still elevated but gradually improving, monitor trend -4/22  increase lopressor to 100mg  BID, heart rate can likely tolerate this -4/26 patient is declining Lopressor, encouraged use of medications, will also contact cardiology regarding possible medication adjustments     09/05/2022    7:12 AM 09/05/2022    5:36 AM 09/04/2022    7:27 PM  Vitals with BMI  Weight  349 lbs 14 oz   BMI  42.61   Systolic 161 157 409  Diastolic 96 90 88  Pulse 85 82 86     11. Morbid obesity: Continue to educate on diet, exercise and compliance to help promote overall health and mobility.    12. Anemia:   -08/23/22 HGb 9.2, relatively stable; monitor on routine labs  -4/22 Hgb stable at 9.0  13. Constipation: usually goes daily at home  -08/24/22 no BM in 2 days, will start miralax 17g QD for now; monitor  -4/19 last BM today improved  -08/30/22 LBM 2 days ago but doesn't want to change meds; monitor   -4/23 LBMs 4/22  14.  Chest pain and nausea, improving  -Check EKG-NSR, Toponin- negative x3 -Pt reports this is related to his use of entresto, will DC this medication, He says he is feeling better now  -4/19 reports this has resolved since stopping Entresto  -Denies further chest pain  15. Azotemia  -4/25 Decrease spironolactone back down to 25mg , hold dose tomorrow   -4/26 Cr still up at 1.32, spironolactone held today, will contact cardiology for recommendations   LOS: 14 days A FACE TO FACE EVALUATION WAS PERFORMED  Fanny Dance 09/05/2022, 8:21 AM

## 2022-09-05 NOTE — Progress Notes (Signed)
Physical Therapy Session Note  Patient Details  Name: Alan Tapia. MRN: [id number] Date of Birth: Oct 04, 1975  Today's Date: 09/05/2022 PT Individual Time: 4098-1191 PT Individual Time Calculation (min): 59 min   Short Term Goals: Week 2:  PT Short Term Goal 1 (Week 2): Pt ambulate 5 feet with LRAD and mod A PT Short Term Goal 2 (Week 2): pt will perform stand pivot transfer with CGA PT Short Term Goal 3 (Week 2): pt will perform sit<>stand from [facility] level  with mod A  Skilled Therapeutic Interventions/Progress Updates: Pt presents asleep sitting in w/c, but arouses quickly and agreeable to therapy.  Pt negotiates w/c into hallway and x 130' w/ supervision.  Pt negotiates w/c to mat side w/ cueing for removal of R leg rest before final approach to mat.  Pt removes leg rests independently.  Pt performs lateral scoot w/c > mat table w/ supervision, but still requires education for forward lean to clear buttocks.  Pt able to scoot 2/2 accommodating surface and silky gown.  Pt performs seated catching/throwing/bouncing of red T-ball including overhead throws first w/ RLE on floor, then on [employer] cushion and elevated mat table blocks of 15-20.  Pt picking up rolled ball and overhead throw 3 x 10.  Pt performed trunk rotation tapping red T-ball to top of cone outside of BOS to B sides, w/ shoulder overhead press between each rotation 3 x 10.  Pt performed sit to stand from elevated mat table (24") w/ min A and SPT w/ RW and min A stepping back to w/c x 5 attempts before needing w/c pulled behind.  Pt performed UBE at Level 6 x 7" forward and 8' backwards w/o trunk support.  Pt returned to room and remained sitting in w/c w/ all needs in reach, spouse present in room.     Therapy Documentation Precautions:  Precautions Precautions: Fall, Other (comment) (wound vac) Required Braces or Orthoses: Other Brace Other Brace: L limb guard Restrictions Weight Bearing Restrictions: Yes LLE Weight Bearing:  Non weight bearing General:   Vital Signs: Therapy Vitals Temp: 97.7 F (36.5 C) Pulse Rate: 90 Resp: 17 BP: (!) 158/85 Patient Position (if appropriate): Sitting Oxygen Therapy SpO2: 99 % O2 Device: Room Air Pain:0/10      Therapy/Group: Individual Therapy  Lucio Yader 09/05/2022, 1:24 PM

## 2022-09-05 NOTE — Progress Notes (Addendum)
Patient ID: Alan Tapia., male   DOB: 1975-05-16, 47 y.o.   MRN: [id number]  Covering for primary Sw, Becky D.   Sw met with spouse to inform spouse that Medicaid is currently not active and potential to be active 5/1. Spouse informed all items have been ordered through [facility].  Spouse has confirmed that currently they do not have a spare tire for their truck that needs repairing outside. Spouse plans to have church members assist with repairs and getting vehicle moved by d/c.  Spouse has been informed by the [employer] that the move to the handicapped accessible apartment has been put on hold. The Housing authority has offered to install a ramp and take measurements of the home for patient and spouse. SW will continue to wait for updates from spouse. No additional questions or concerns.   Sw received all from case worker at [phone]. SW informed plan not active until 09/10/22.

## 2022-09-05 NOTE — Progress Notes (Signed)
Physical Therapy Session Note  Patient Details  Name: Alan Tapia. MRN: [id number] Date of Birth: [birth year]  Today's Date: 09/05/2022 PT Individual Time: 1500-1530 PT Individual Time Calculation (min): 30 min   Short Term Goals: Week 2:  PT Short Term Goal 1 (Week 2): Pt ambulate 5 feet with LRAD and mod A PT Short Term Goal 2 (Week 2): pt will perform stand pivot transfer with CGA PT Short Term Goal 3 (Week 2): pt will perform sit<>stand from [facility] level  with mod A  Skilled Therapeutic Interventions/Progress Updates: Pt presented in w/c with wife present agreeable to therapy. Wife present throughout session. Scott from [employer] present providing new shrinker with pt stating increased pain but did not rate. Pt states fatigued from previous sessions but agreeable to work on w/c mobility in community setting. Pt propelled w/c to elevators without rest breaks and PTA held elevator doors with pt able to enter/exit elevators independently. PTA then transported pt to [facility] entrance and pt propelled throughout [facility] patio with distant supervision. PTA provided into to wife on obtaining leather padded gloves to facilitate pt's propulsion. Pt was able to navigate safely throughout patio going up/down small grades without assist but increased effort noted. Pt did require intermittent rest breaks due to fatigue. Pt was able to propel back through [facility] entrance, through atrium, to elevators and back to room with minimal breaks to build endurance. Pt returned to room at end of session and remained in w/c with call bell within reach and needs met.      Therapy Documentation Precautions:  Precautions Precautions: Fall, Other (comment) (wound vac) Required Braces or Orthoses: Other Brace Other Brace: L limb guard Restrictions Weight Bearing Restrictions: Yes LLE Weight Bearing: Non weight bearing General: PT Amount of Missed Time (min): 15 Minutes PT Missed Treatment Reason: Other (Comment) Vital  Signs: Therapy Vitals Temp: 97.7 F (36.5 C) Pulse Rate: 90 Resp: 17 BP: (!) 158/85 Patient Position (if appropriate): Sitting Oxygen Therapy SpO2: 99 % O2 Device: Room Air   Therapy/Group: Individual Therapy  [name] 09/05/2022, 3:53 PM

## 2022-09-05 NOTE — Progress Notes (Signed)
Occupational Therapy Weekly Progress Note  Patient Details  Name: Alan Tapia. MRN: [id number] Date of Birth: 1975-08-11  Beginning of progress report period: August 29, 2022 End of progress report period: September 05, 2022  Today's Date: 09/05/2022 OT Individual Time: 0815-0900 OT Individual Time Calculation (min): 45 min    Patient has met 1 of 3 short term goals. Chamar has made good progress toward OT goals despite only meeting 1/3 goals. He is currently refusing to complete ADLs with OT and only wanting to work on ADL transfers, standing and hopping progression and core/UE strengthening. Presume ADLs are remaining at min A level from seated level with lateral leans. He has improved in standing from the w/c- requiring mod A consistently but does fatigue quickly.   Patient continues to demonstrate the following deficits: muscle weakness, decreased cardiorespiratoy endurance, and decreased standing balance and decreased balance strategies and therefore will continue to benefit from skilled OT intervention to enhance overall performance with BADL and iADL.  Patient progressing toward long term goals..  Continue plan of care.  OT Short Term Goals Week 2:  OT Short Term Goal 1 (Week 2): Pt will complete a stand pivot transfer with min A using LRAD OT Short Term Goal 1 - Progress (Week 2): Met OT Short Term Goal 2 (Week 2): Pt will complete a sit > stand with CGA consistently from the w/c OT Short Term Goal 2 - Progress (Week 2): Progressing toward goal OT Short Term Goal 3 (Week 2): Pt will complete toileting tasks with CGA overall OT Short Term Goal 3 - Progress (Week 2): Progressing toward goal Week 3:  OT Short Term Goal 1 (Week 3): Pt will complete sit > stand from the w/c with min A OT Short Term Goal 2 (Week 3): Pt will complete all toileting tasks with CGA OT Short Term Goal 3 (Week 3): Pt will demonstrate carryover of all residual limb care with (S)  Skilled Therapeutic  Interventions/Progress Updates:    Pt received sitting in the w/c with pain un-rated but reported as controlled and pt premedicated. He propelled the w/c to the gym with no rest breaks, (S). Lateral scoot to the mat with (S). He transitioned into supine on the mat and completed superset glute bridges with R/L unilateral lat pull downs with resistance band. He completed 3x10. Cueing required for technique with glute bridges specifically and control of trunk. He completed a sit > stand from EOM with CGA with mat elevated. He completed a stand pivot transfer with a sudden LOB, w/c in correct position to catch fall. He transferred back to [facility] and tried again- this time with CGA overall. He returned to his room and was left sitting up with all needs met.   Therapy Documentation Precautions:  Precautions Precautions: Fall, Other (comment) (wound vac) Required Braces or Orthoses: Other Brace Other Brace: L limb guard Restrictions Weight Bearing Restrictions: Yes LLE Weight Bearing: Non weight bearing   Therapy/Group: Individual Therapy  Crissie Reese 09/05/2022, 6:26 AM

## 2022-09-05 NOTE — Progress Notes (Addendum)
Rounding Note    Patient Name: Alan Tapia. Date of Encounter: 09/05/2022  [facility] Cardiologist: Meriam Sprague, MD   Subjective   Cardiology asked to resee to help adjust CHF medications. He has not been able to tolerate multiple medications. He now feels like he is not tolerating Lopressor due to worsening lower extremity edema. Spironolactone has helped some. He denies any chest pain (since Entresto and Coreg have been stopped) or shortness of breath. No orthopnea or PND.  Inpatient Medications    Scheduled Meds:  acetaminophen  650 mg Oral TID   vitamin C  1,000 mg Oral Daily   enoxaparin (LOVENOX) injection  80 mg Subcutaneous Q24H   furosemide  40 mg Oral Daily   gabapentin  400 mg Oral TID   hydrocerin   Topical BID   insulin aspart  0-5 Units Subcutaneous QHS   insulin aspart  0-9 Units Subcutaneous TID WC   [START ON 09/06/2022] insulin glargine-yfgn  12 Units Subcutaneous Daily   melatonin  5 mg Oral QHS   metoprolol tartrate  100 mg Oral BID   multivitamin with minerals  1 tablet Oral Daily   nutrition supplement (JUVEN)  1 packet Oral BID BM   polyethylene glycol  17 g Oral Daily   [START ON 09/06/2022] spironolactone  25 mg Oral Daily   Continuous Infusions:  PRN Meds: acetaminophen, alum & mag hydroxide-simeth, bisacodyl, guaiFENesin-dextromethorphan, hydrocortisone cream, methocarbamol, oxyCODONE, phenol, polyethylene glycol, prochlorperazine **OR** prochlorperazine **OR** prochlorperazine, sodium phosphate, traMADol, traZODone   Vital Signs    Vitals:   09/04/22 1927 09/05/22 0536 09/05/22 0712 09/05/22 1304  BP: (!) 156/88 (!) 157/90 (!) 161/96 (!) 158/85  Pulse: 86 82 85 90  Resp:  15 14 17   Temp: 97.9 F (36.6 C) (!) 97.4 F (36.3 C) 97.6 F (36.4 C) 97.7 F (36.5 C)  TempSrc:      SpO2: 100% 99% 100% 99%  Weight:  (!) 158.7 kg    Height:        Intake/Output Summary (Last 24 hours) at 09/05/2022 1713 Last data filed  at 09/05/2022 1705 Gross per 24 hour  Intake 1840 ml  Output 1700 ml  Net 140 ml      09/05/2022    5:36 AM 09/04/2022    5:36 AM 09/03/2022    5:55 AM  Last 3 Weights  Weight (lbs) 349 lb 14.4 oz 345 lb 1.6 oz 342 lb 12.8 oz  Weight (kg) 158.714 kg 156.536 kg 155.493 kg      Telemetry    No telemetry in CIR.  ECG    No new ECG tracing since 08/26/2022.    Physical Exam   GEN: Morbidly obese African-American male resting comfortably in no acute distress.   Neck: No JVD. Cardiac: RRR. No murmurs, rubs, or gallops.  Respiratory: Clear to auscultation bilaterally. No wheezes, rhonchi, or rales.  GI: Soft, non-distended, and non-tender. MS: 1-2+ pitting edema of bilateral lower extremities. S/p left BKA.  Skin: Warm and dry. Neuro:  No focal deficits. Psych: Normal affect. Responds appropriately.  Labs    High Sensitivity Troponin:   Recent Labs  Lab 08/26/22 0926 08/26/22 1142 08/26/22 1431  TROPONINIHS 4 4 5      Chemistry Recent Labs  Lab 09/01/22 0543 09/04/22 0551 09/05/22 0621  NA 136 139 137  K 3.9 4.0 4.0  CL 104 105 108  CO2 24 25 24   GLUCOSE 153* 133* 116*  BUN 15 17 19  CREATININE 1.19 1.28* 1.32*  CALCIUM 8.0* 8.3* 8.0*  GFRNONAA >60 >60 >60  ANIONGAP 8 9 5     Lipids No results for input(s): "CHOL", "TRIG", "HDL", "LABVLDL", "LDLCALC", "CHOLHDL" in the last 168 hours.  Hematology Recent Labs  Lab 09/01/22 0543  WBC 6.5  RBC 3.88*  HGB 9.0*  HCT 30.8*  MCV 79.4*  MCH 23.2*  MCHC 29.2*  RDW 19.7*  PLT 355   Thyroid No results for input(s): "TSH", "FREET4" in the last 168 hours.  BNPNo results for input(s): "BNP", "PROBNP" in the last 168 hours.  DDimer No results for input(s): "DDIMER" in the last 168 hours.   Radiology    No results found.  Cardiac Studies   Echocardiogram 09/05/2022: Impressions:  1. Left ventricular ejection fraction, by estimation, is 35 to 40%. The  left ventricle has moderately decreased function. The  left ventricle  demonstrates global hypokinesis. The left ventricular internal cavity size  was mildly dilated. Indeterminate  diastolic filling due to E-A fusion.   2. Right ventricular systolic function is normal. The right ventricular  size is normal. Tricuspid regurgitation signal is inadequate for assessing  PA pressure.   3. Left atrial size was mildly dilated.   4. The mitral valve is grossly normal. Mild mitral valve regurgitation.   5. The aortic valve is tricuspid. Aortic valve regurgitation is not  visualized.   6. The inferior vena cava is dilated in size with <50% respiratory  variability, suggesting right atrial pressure of 15 mmHg.    Patient Profile     47 y.o. male with a history of  recently diagnosed HFrEF with EF of 35-40%,  uncontrolled hypertension, uncontrolled type 2 diabetes mellitus, bilateral lower extremity lymphedema, chronic left foot ulcer, chronic anemia, morbid obesity, and medication non-compliance. He was admitted on 08/13/2022 for osteomyelitis and septic arthritis of left foot after presenting with increased swelling and drainage from chronic left foot ulcers. 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 Cardiology was initially consulted for evaluation of new systolic CHF with EF of 35-40% on Echo. He was discharged to CIR on 08/22/2022. He has had difficulty tolerating CHF medications so Cardiology was asked to re-round.  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 Echo showed LVEF of 35-50% with global hypokinesis, normal RV, and mild MR. He was started on GDMT but has had trouble tolerated multiple medications. He felt like Entresto and Coreg caused him chest pain and profuse vomiting. Previously did not tolerate Jardiance in [name]. Now only on Lasix 40mg  daily, Lopressor 100mg  twice daily, and Spironolactone. Dose of Spironolactone has  changed multiple times but scheduled to restart at 25mg  daily tomorrow. He thinks Lopressor may be causing worsening edema. He does have some mild edema on exam but otherwise euvolemic. Lungs clear. Recommend continuing to adjust GDMT but patient is very clear that he does not want to adjust any medications right now. He would prefer to finish rehab and then adjust medications as an outpatient. Therefore, will not make any changes at this time.  Etiology of cardiomyopathy felt to possible be due to uncontrolled hypertension or possible stress induced cardiomyopathy insetting of sepsis. However, he has multiple CV risk factors. Plans is to optimize GDMT and then repeat Echo in 2-3 months. If EF still down at that time, can consider ischemic evaluation.  Hypertension BP mildly elevated in the 140-150s/80s. Continue CHF medications  as above.  Otherwise, per primary team: - Type 2 diabetse mellitus - Osteomyelitis s/p recent left BKA - Chronic anemia - Morbid obesity For questions or updates, please contact [facility] Please consult [email or web address] for contact info under    Signed, Corrin Parker, PA-C  09/05/2022, 5:13 PM     Patient seen and examined, note reviewed with the signed Advanced Practice Provider. I personally reviewed laboratory data, imaging studies and relevant notes. I independently examined the patient and formulated the important aspects of the plan. I have personally discussed the plan with the patient and/or family. Comments or changes to the note/plan are indicated below.  Patient was seen and examined at his bedside. His wife at his bedside.  During our encounter he reports that he is not ready to discuss getting his medication optimized until he completes rehab and follows up our team.  No medication changes will be made at this time.   Thomasene Ripple DO, MS [place] Attending Cardiologist [facility]  [address] [place], [name]  [address] [phone] Website: [email or web address]

## 2022-09-05 NOTE — Progress Notes (Signed)
Orthopedic Tech Progress Note Patient Details:  Alan Tapia 1975/08/17 [id number]  Patient ID: [name], male   DOB: November 23, 1975, 47 y.o.   MRN: [id number] Order placed with [facility]anger for shrinker. Darleen Crocker 09/05/2022, 11:44 AM

## 2022-09-06 LAB — GLUCOSE, CAPILLARY
Glucose-Capillary: 165 mg/dL — ABNORMAL HIGH (ref 70–99)
Glucose-Capillary: 151 mg/dL — ABNORMAL HIGH (ref 70–99)
Glucose-Capillary: 228 mg/dL — ABNORMAL HIGH (ref 70–99)
Glucose-Capillary: 124 mg/dL — ABNORMAL HIGH (ref 70–99)

## 2022-09-06 NOTE — Progress Notes (Signed)
Physical Therapy Session Note  Patient Details  Name: Alan Tapia. MRN: [id number] Date of Birth: 03-16-1976  Today's Date: 09/06/2022 PT Individual Time: 1425-1506 PT Individual Time Calculation (min): 41 min   Short Term Goals: Week 2:  PT Short Term Goal 1 (Week 2): Pt ambulate 5 feet with LRAD and mod A PT Short Term Goal 2 (Week 2): pt will perform stand pivot transfer with CGA PT Short Term Goal 3 (Week 2): pt will perform sit<>stand from [facility] level  with mod A  Skilled Therapeutic Interventions/Progress Updates:    Pt received sitting in w/c with his wife, [name], present and pt eager to participate in therapy session. Pt performed B UE w/c propulsion ~130ft to main therapy gym requiring theraband on wheels and gloves to improve propulsion ability. Upon evaluation of pt's positioning in current 22x18 wheelchair, notice pt's hand lands ~6inches too far anterior to the axle and the ~5inches below the axle placing the pt's entire UE but especially his shoulders in improper alignment during propulsion, placing him at higher risk for repetitive stress injury (RSI) especially given pt's weight. This therapist highly recommends a custom manual wheelchair evaluation to discuss alternate wheelchair options to provide pt with improved alignment, which will also likely result in pt requiring a higher seat-to-floor height improving his ability to safely come to stand from the wheelchair because the current chair sits too low to the floor for pt's tibia length and stature.  Attempted sit>stand w/c>black heavy duty bari-RW x3 from wheelchair; however, discontinued due to safety concerns as pt tends to move R foot forward when he is trying to get momentum to bring himself up causing him to push the back of his R LE into the chair sliding the w/c backwards out from underneath him and not having sufficient anterior trunk weight shift nor strength to power up into standing, causing him to be at risk for  falling.  Pt able to set-up wheelchair for lateral scoot without cuing or assistance. R lateral scoot w/c>EOM with supervision with pt continuing to bias kicking his R foot forward out from underneath his BOS during the scoot as well minimizing the ability he has to push up through it.   Sit>stands 23.5in height mat >black bari-RW with min assist progressed to CGA for steadying safety - pt continues to use momentum to improve his ability to power up and therapist blocking him from sliding R foot forward on ground x4 reps - cuing for slow eccentric control when going to sit to promote strengthening of R LE hip/knee extensors. Pt's L LE limb protector repeatedly falls off during this task despite attempts at readjusting it.   Sit<>supine on mat mod-I with increased time/effort. Supine bridging with L LE placed on theraball and preventing pt from using his UEs to compensate x20 reps with pt able to achieve sufficient hip clearance.  L stand pivot EOM>W/C using black bari-RW with min assist for balance and pt with very limited ability to power up through B UEs in order to clear R foot to hop backwards so therapist had to bring w/c up behind him.  Noticed bari-RW was not level, back legs were lower than front legs, likely making it more challenging for him to push through B UEs in order to hop - adjusted height and recommended pt follow-up with primary PT if this makes it more challenging instead of less.   Transported back to room and pt left seated in w/c with needs in reach and  family present.   Therapy Documentation Precautions:  Precautions Precautions: Fall, Other (comment) (wound vac) Required Braces or Orthoses: Other Brace Other Brace: L limb guard Restrictions Weight Bearing Restrictions: Yes LLE Weight Bearing: Non weight bearing   Pain:  Reports that the pain usually comes after therapy session is over in the form of muscle soreness in R LE quads, hamstrings, and glutes and then in  the form of phantom pain in L LE.    Therapy/Group: Individual Therapy  Ginny Forth , PT, DPT, NCS, CSRS 09/06/2022, 12:58 PM

## 2022-09-06 NOTE — Progress Notes (Signed)
Occupational Therapy Session Note  Patient Details  Name: Alan Tapia. MRN: [id number] Date of Birth: 05/28/1975  Today's Date: 09/06/2022 OT Individual Time: 0917-1000 OT Individual Time Calculation (min): 43 min   Short Term Goals: Week 3:  OT Short Term Goal 1 (Week 3): Pt will complete sit > stand from the w/c with min A OT Short Term Goal 2 (Week 3): Pt will complete all toileting tasks with CGA OT Short Term Goal 3 (Week 3): Pt will demonstrate carryover of all residual limb care with (S)  Skilled Therapeutic Interventions/Progress Updates:     Pt received sitting up in wc with wife present in room. Pt in good spirits and receptive to skilled OT session reporting 0/10 pain. Focus this session dynamic standing balance, sit<>stands, and activity tolerance. Pt propelled wc to therapy gym for endurance and functional mobility training. Pt requiring multiple short rest breaks, however able to complete at overall supervision level. Completed squat pivot transfer to EOM with close supervision with Pt able to position wc, remove R arm rest, remove foot rests, and lock breaks in preparation for transfer. Worked on sit<>stands from mat with Pt requiring multiple attempts to power up to stand. Pt able to position foot and RW for sit<>stand with min cueing for body mechanics. Pt completed 3 sit<>stands from slightly elevated mat with min A. Following 3rd stand, engaged Pt in dynamic standing balance and functional reaching task with Pt instructed to alternate reaching with R/LUE for therapist's hand when presented in high/low right/left visual fields with Pt able to maintain standing balance with CGA with rest break provided following. Engaged Pt in bean bag toss dynamic standing balance activity with overhead and inferior reaching incorporated into task with Pt instructed to alternate reaching with R/UE to retrieve bean bag and then toss at target ~10 feet away. Pt with improved body awareness and  weight shifting during task. Graded up activity to incorporate posterior reaching with single UE support on HDRW. Pt able to maintain balance with CGA with increased time required for posterior reaching to increase Pt safety. Pt then completed stand pivot transfer EOM>WC using HDRW with min A and min VB cues for safety. Pt propelled wc back to room with multiple rest breaks provided. Left resting in wc at end of session with call bell in reach, wife present in room, and all needs met.   Therapy Documentation Precautions:  Precautions Precautions: Fall, Other (comment) (wound vac) Required Braces or Orthoses: Other Brace Other Brace: L limb guard Restrictions Weight Bearing Restrictions: Yes LLE Weight Bearing: Non weight bearing  Therapy/Group: Individual Therapy  Army Fossa 09/06/2022, 7:53 AM

## 2022-09-06 NOTE — Progress Notes (Signed)
PROGRESS NOTE   Subjective/Complaints:  Pt doing alright today, refused to change meds yesterday with cardiology, not ready yet. Slept better last night, woken up at 2am for vitals which is frustrating, but able to go back to sleep for a while. Pain is tolerable, phantom pain is improving. LBM this morning. Urinating fine. Denies any other complaints or concerns.    ROS: + Phantom pain-improving, +leg swelling-fluctuating/improving Denies fevers, chills, CP, SOB, abd pain, N/V/D/C, new/worsening paresthesias/weakness, or any other complaints at this time.      Objective:   No results found. No results for input(s): "WBC", "HGB", "HCT", "PLT" in the last 72 hours.   Recent Labs    09/04/22 0551 09/05/22 0621  NA 139 137  K 4.0 4.0  CL 105 108  CO2 25 24  GLUCOSE 133* 116*  BUN 17 19  CREATININE 1.28* 1.32*  CALCIUM 8.3* 8.0*     Intake/Output Summary (Last 24 hours) at 09/06/2022 1440 Last data filed at 09/05/2022 1857 Gross per 24 hour  Intake 880 ml  Output 600 ml  Net 280 ml        Physical Exam: Vital Signs Blood pressure (!) 153/82, pulse 74, temperature 98 F (36.7 C), temperature source Oral, resp. rate 15, height 6\' 4"  (1.93 m), weight (!) 159.8 kg, SpO2 96 %.  Constitutional: No distress . Vital signs reviewed. Sitting in w/c HEENT: NCAT, EOMI, oral membranes moist Neck: supple Cardiovascular: RRR without murmur. No JVD    Respiratory/Chest: CTA Bilaterally without wheezes or rales. Normal effort    GI/Abdomen: BS +, non-tender, non-distended Ext: no clubbing or cyanosis. +BLE edema (chronic lymphedema in RLE about 1+, 1+ LLE edema at knee/stump) Psych: pleasant and cooperative  Skin: dry, warm. Chronic changes distal RLE d/t lymphedema, 1+ edema. No sign of breakdown. 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 Normal insight and awareness. Intact Memory.  MSK:  Chronic lymphedema RLE- appears similar to last week or maybe a bit better, LLE in  limb protector    Assessment/Plan: 1. Functional deficits which require 3+ hours per day of interdisciplinary therapy in a comprehensive inpatient rehab setting. Physiatrist is providing close team supervision and 24 hour management of active medical problems listed below. Physiatrist and rehab team continue to assess barriers to discharge/monitor patient progress toward functional and medical goals  Care Tool:  Bathing    Body parts bathed by patient: Right arm, Left arm, Chest, Abdomen, Front perineal area, Face, Right upper leg, Left upper leg   Body parts bathed by helper: Right lower leg, Left lower leg, Buttocks Body parts n/a: Left lower leg   Bathing assist Assist Level: Minimal Assistance - Patient > 75%     Upper Body Dressing/Undressing Upper body dressing   What is the patient wearing?: Pull over shirt    Upper body assist Assist Level: Set up assist    Lower Body Dressing/Undressing Lower body dressing      What is the patient wearing?: Pants     Lower body assist Assist for lower body dressing: Minimal Assistance - Patient > 75%     Toileting Toileting Toileting Activity did  not occur Press photographer and hygiene only): N/A (no void or bm)  Toileting assist Assist for toileting: Minimal Assistance - Patient > 75%     Transfers Chair/bed transfer  Transfers assist     Chair/bed transfer assist level: Supervision/Verbal cueing     Locomotion Ambulation   Ambulation assist   Ambulation activity did not occur: Safety/medical concerns          Walk 10 feet activity   Assist  Walk 10 feet activity did not occur: Safety/medical concerns        Walk 50 feet activity   Assist Walk 50 feet with 2 turns activity did not occur: Safety/medical concerns         Walk 150 feet activity   Assist Walk 150 feet activity did not occur: Safety/medical  concerns         Walk 10 feet on uneven surface  activity   Assist Walk 10 feet on uneven surfaces activity did not occur: Safety/medical concerns         Wheelchair     Assist Is the patient using a wheelchair?: Yes Type of Wheelchair: Manual    Wheelchair assist level: Supervision/Verbal cueing Max wheelchair distance: 130    Wheelchair 50 feet with 2 turns activity    Assist        Assist Level: Supervision/Verbal cueing   Wheelchair 150 feet activity     Assist      Assist Level: Supervision/Verbal cueing   Blood pressure (!) 153/82, pulse 74, temperature 98 F (36.7 C), temperature source Oral, resp. rate 15, height 6\' 4"  (1.93 m), weight (!) 159.8 kg, SpO2 96 %.  Medical Problem List and Plan: 1. 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  Antithrombotics: -DVT/anticoagulation:  Pharmaceutical: Lovenox 80mg  QD             -antiplatelet therapy: N/A 3. Pain Management: Tylenol 650mg  TID, Oxycodone 5-10mg  q4h prn. Tramadol 50mg  q6h PRN.              -add robaxin 500mg  q6h prn for spasms.  -08/23/22 having phantom pain interfering with sleep, will add gabapentin 100mg  TID for now, monitor for effect/side effects.  4/15 reports pain controlled -4/17 increase gabapentin to  200mg  TID, Increase robaxin dose to 750mg  PRN -4/19 reports pain continues to improve, continue current regimen for now -08/30/22 gabapentin dosing never increased, will inc to 200mg  TID now since  -4/23 increased gabapentin dose to 300mg  TID -4/25 Increase  gabapentin to 400mg  TID -09/06/22 Improving pain, cont regimen  4. Mood/Behavior/Sleep: LCSW to follow for evaluation and support.              -antipsychotic agents: N/A -08/23/22 poor sleep, ordered melatonin 5mg  QHS, also has trazodone PRN--improved 08/24/22 -4/24--sleep is fair to inconsistent -09/06/22 pt being awoken for VS, changed VS order to QShift to lessen early AM wake ups.   5. Neuropsych/cognition: This patient is capable of making decisions on his own behalf. 6. Skin/Wound Care:  Monitor wound for healing             --limb protector when up -he's post-op day #7. Spoke with Dr Lajoyce Corners who recommended changing vac to prevena and continuing for another week.  -consider DC vac 4/21 -08/31/22 Prevena machine deactivated, d/c'd, asked nursing to apply kerlex/abd pad/ace wrap dressing, defer to primary team for further wound care orders. Also asked nursing to get TED hose for RLE -4/22 wound VAC has been removed  7. Fluids/Electrolytes/Nutrition: Monitor I/O. Continue Vitamin C and Zinc and MVI. Monitor routine Monday labs.              --add juven BID additionally -08/23/22 BMP with mild hyponatremia 134, otherwise fairly unremarkable, monitor on routine Monday labs -4/15 Na up to 137, improved -4/25 Na stable at 139, monitor routine labs  8. T2DM: Hgb A1c- 10.3 and poorly controlled. --dietician has added double portion as well as snacks TID to help with hunger/apptite             --monitor BS ac/hs and titrate insulin as indicated             --continue Insulin glargine 14 units QD and SSI  -4/16 fair control, continue current regimen for now  -4/17 monitor for now, consider increase glargine  -4/26 patient reports hypoglycemic symptoms when glucose was 101 this morning, will decrease glargine to 12 units -09/06/22 CBGs fairly well controlled, monitor for trend with new dosing CBG (last 3)  Recent Labs    09/05/22 2103 09/06/22 0541 09/06/22 1134  GLUCAP 161* 165* 151*    9.  Dilated CM of unclear etiology: On Lasix 40mg  QD, Entresto 49-51mg  BID (dose increased today>>d/c'd 4/16) and spironolactone 25mg  QD             --will add low salt restrictions. Monitor daily weights --add low dose KCL due to recurrent hypokalemia requiring intermittent supplementation --recheck BMET 04/13 and on 04/15 as on spiro -pt is having nausea and ?diarrhea with coreg increase. Will convert him to metoprolol 25mg  bid to start--->titrate as needed. -08/23/22 K+ 3.7; pt educated on med changes and importance of compliance. No weight done today, ordered daily weights.  -08/24/22 no wt again, asked nursing to please make sure daily wt done; slight increase in RLE swelling noted, but mild, monitor for now -4/16 Will contact cardiology regarding DC of entresto -4/18 Seen by cardiology appreciate assistance, increase lopressor to 50mg  BID -08/30/22 wt stable, monitor -08/31/22 wt a little up, mild swelling in RLE, asked for TED hose for now -4/24 weights trending up.   Spironolactone was increased to 50mg  daily effective TODAY, recheck BMP thursday  -interestingly his RLE lymphedema looks better -4/25 Will decrease spironolactone back to 25mg  daily, hold tomorrow, will ask for wts to be checked standing if possible -4/25 will attempt to Keep potassium >4, give 1x dose today to help keep optimal since will hold spironolactone, advised leg elevation -4/26 will contact cardiology regarding recommendations, weight does appear increased although patient says they have been weighing him with heavy blankets.  Gabapentin could be possible cause of leg swelling also however will hold off on making changes to this currently. -09/06/22 pt resistant to med changes for now; cont current regimen, wt still uptrending a bit so monitor closely; swelling fairly stable from prior; monitor  Filed Weights   09/04/22 0536 09/05/22 0536 09/06/22 0544  Weight: (!) 156.5 kg (!) 158.7 kg (!) 159.8 kg    10. HTN: Monitor BP  TID--now on Lasix, Entresto and Spironolactone.             -metoprolol as above  -08/23/22 BPs elevated but recent change to meds, monitor for now  -4/15 mildly elevated, monitor for now -4/16 Entresto DC, will consider additional medication if BP remains elevated, will discuss with cardiology  -4/17 Cardiology notified, will contract cardiology again for possible different medication, cardiology plans to discuss with   -4/18 increase lopressor dose to 50mg  twice daily -4/19 blood pressures appear to be improving, continue monitor response -08/30/22  BPs still elevated but gradually improving, monitor trend -4/22  increase lopressor to 100mg  BID, heart rate can likely tolerate this -4/26 patient is declining Lopressor, encouraged use of medications, will also contact cardiology regarding possible medication adjustments -09/06/22 BPs so variable but hard to manage with pt refusing meds; resistant to changing meds; monitor but suspect it will be difficult to control with pt's noncompliance/resistance Vitals:   09/03/22 0555 09/03/22 1315 09/03/22 1957 09/04/22 0536  BP: (!) 149/90 (!) 155/83 (!) 166/95 (!) 151/85   09/04/22 1308 09/04/22 1927 09/05/22 0536 09/05/22 0712  BP: (!) 147/86 (!) 156/88 (!) 157/90 (!) 161/96   09/05/22 1304 09/05/22 1949 09/06/22 0544 09/06/22 1350  BP: (!) 158/85 (!) 170/88 (!) 155/87 (!) 153/82      11. Morbid obesity: Continue to educate on diet, exercise and compliance to help promote overall health and mobility.    12. Anemia:   -08/23/22 HGb 9.2, relatively stable; monitor on routine labs  -4/22 Hgb stable at 9.0, monitor on routine labs  13. Constipation: usually goes daily at home  -08/24/22 no BM in 2 days, will start miralax 17g QD for now; monitor  -4/19 last BM today improved  -08/30/22 LBM 2 days ago but doesn't want to change meds; monitor   -09/06/22 LBM today, monitor on current regimen  14.  Chest pain and nausea, improving  -Check EKG-NSR,  Toponin- negative x3 -Pt reports this is related to his use of entresto, will DC this medication, He says he is feeling better now  -4/19 reports this has resolved since stopping Entresto  -Denies further chest pain  15. Azotemia -4/25 Decrease spironolactone back down to 25mg , hold dose tomorrow  -4/26 Cr still up at 1.32, spironolactone held today, will contact cardiology for recommendations -09/06/22 per cardiology, would be recommended to optimize GDMT but pt resistant; can f/up outpatient; monitor routine labs while here   LOS: 15 days A FACE TO FACE EVALUATION WAS PERFORMED  [address] Lonzell Dorris 09/06/2022, 2:40 PM

## 2022-09-07 LAB — GLUCOSE, CAPILLARY
Glucose-Capillary: 125 mg/dL — ABNORMAL HIGH (ref 70–99)
Glucose-Capillary: 143 mg/dL — ABNORMAL HIGH (ref 70–99)
Glucose-Capillary: 161 mg/dL — ABNORMAL HIGH (ref 70–99)
Glucose-Capillary: 144 mg/dL — ABNORMAL HIGH (ref 70–99)

## 2022-09-07 MED ORDER — SPIRONOLACTONE 25 MG PO TABS
50.0000 mg | ORAL_TABLET | Freq: Every day | ORAL | Status: DC
  Administered 2022-09-08 – 2022-09-10 (×3): 50 mg via ORAL
  Filled 2022-09-07 (×3): qty 2

## 2022-09-07 NOTE — Progress Notes (Signed)
Progress Note  Patient Name: Alan Tapia. Date of Encounter: 09/07/2022  Primary Cardiologist:   Meriam Sprague, MD   Subjective   No chest pain. No SOB.   Inpatient Medications    Scheduled Meds:  acetaminophen  650 mg Oral TID   vitamin C  1,000 mg Oral Daily   enoxaparin (LOVENOX) injection  80 mg Subcutaneous Q24H   furosemide  40 mg Oral Daily   gabapentin  400 mg Oral TID   hydrocerin   Topical BID   insulin aspart  0-5 Units Subcutaneous QHS   insulin aspart  0-9 Units Subcutaneous TID WC   insulin glargine-yfgn  12 Units Subcutaneous Daily   melatonin  5 mg Oral QHS   metoprolol tartrate  100 mg Oral BID   multivitamin with minerals  1 tablet Oral Daily   nutrition supplement (JUVEN)  1 packet Oral BID BM   polyethylene glycol  17 g Oral Daily   spironolactone  25 mg Oral Daily   Continuous Infusions:  PRN Meds: acetaminophen, alum & mag hydroxide-simeth, bisacodyl, guaiFENesin-dextromethorphan, hydrocortisone cream, methocarbamol, oxyCODONE, phenol, polyethylene glycol, prochlorperazine **OR** prochlorperazine **OR** prochlorperazine, sodium phosphate, traMADol, traZODone   Vital Signs    Vitals:   09/06/22 0544 09/06/22 1350 09/06/22 1948 09/07/22 0509  BP: (!) 155/87 (!) 153/82 138/68 (!) 153/86  Pulse: 79 74 81 68  Resp: 18 15 16 20   Temp: 97.8 F (36.6 C) 98 F (36.7 C) 98.4 F (36.9 C) (!) 97.5 F (36.4 C)  TempSrc: Oral Oral Oral Oral  SpO2: 96% 96% 98% 97%  Weight: (!) 159.8 kg   (!) 159.1 kg  Height:        Intake/Output Summary (Last 24 hours) at 09/07/2022 0919 Last data filed at 09/06/2022 2200 Gross per 24 hour  Intake 713 ml  Output --  Net 713 ml   Filed Weights   09/05/22 0536 09/06/22 0544 09/07/22 0509  Weight: (!) 158.7 kg (!) 159.8 kg (!) 159.1 kg    Telemetry    NA - Personally Reviewed  ECG    NA - Personally Reviewed  Physical Exam   GEN: No acute distress.   Neck: No  JVD Cardiac: RRR, no  murmurs, rubs, or gallops.  Respiratory: Clear  to auscultation bilaterally. GI: Soft, nontender, non-distended  MS: Mild right leg  edema; No deformity. Neuro:  Nonfocal  Psych: Normal affect   Labs    Chemistry Recent Labs  Lab 09/01/22 0543 09/04/22 0551 09/05/22 0621  NA 136 139 137  K 3.9 4.0 4.0  CL 104 105 108  CO2 24 25 24   GLUCOSE 153* 133* 116*  BUN 15 17 19   CREATININE 1.19 1.28* 1.32*  CALCIUM 8.0* 8.3* 8.0*  GFRNONAA >60 >60 >60  ANIONGAP 8 9 5      Hematology Recent Labs  Lab 09/01/22 0543  WBC 6.5  RBC 3.88*  HGB 9.0*  HCT 30.8*  MCV 79.4*  MCH 23.2*  MCHC 29.2*  RDW 19.7*  PLT 355    Cardiac EnzymesNo results for input(s): "TROPONINI" in the last 168 hours. No results for input(s): "TROPIPOC" in the last 168 hours.   BNPNo results for input(s): "BNP", "PROBNP" in the last 168 hours.   DDimer No results for input(s): "DDIMER" in the last 168 hours.   Radiology    No results found.  Cardiac Studies   Echo 08/17/22  1. Left ventricular ejection fraction, by estimation, is 35 to 40%. The  left ventricle has moderately decreased function. The left ventricle  demonstrates global hypokinesis. The left ventricular internal cavity size  was mildly dilated. Indeterminate  diastolic filling due to E-A fusion.   2. Right ventricular systolic function is normal. The right ventricular  size is normal. Tricuspid regurgitation signal is inadequate for assessing  PA pressure.   3. Left atrial size was mildly dilated.   4. The mitral valve is grossly normal. Mild mitral valve regurgitation.   5. The aortic valve is tricuspid. Aortic valve regurgitation is not  visualized.   6. The inferior vena cava is dilated in size with <50% respiratory  variability, suggesting right atrial pressure of 15 mmHg.   Patient Profile     47 y.o. male  with a history of  recently diagnosed HFrEF with EF of 35-40%,  uncontrolled hypertension, uncontrolled type 2  diabetes mellitus, bilateral lower extremity lymphedema, chronic left foot ulcer, chronic anemia, morbid obesity, and medication non-compliance. He was admitted on 08/13/2022 for osteomyelitis and septic arthritis of left foot after presenting with increased swelling and drainage from chronic left foot ulcers. 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 Cardiology was initially consulted for evaluation of new systolic CHF with EF of 35-40% on Echo. He was discharged to CIR on 08/22/2022. He has had difficulty tolerating CHF medications so Cardiology was asked to re-round.   Assessment & Plan    HFrEF:  He has not tolerated Entresto, Coreg or Jardiance.  See below.   HTN:  Possible etiology of the CM.  Plan follow up echo in the months to come and ischemia work up if EF is still reduced.   BP not at target.  I will increase the spironolactone.     For questions or updates, please contact [facility] Please consult [email or web address] for contact info under Cardiology/STEMI.   Signed, Rollene Rotunda, MD  09/07/2022, 9:19 AM

## 2022-09-07 NOTE — Progress Notes (Addendum)
PROGRESS NOTE   Subjective/Complaints:  Pt doing well today! Slept very well, thankfully. Pain is still under control, phantom pain is gradually improving. LBM last night. Urinating fine. Denies any other complaints or concerns. Leg swelling seems to be doing better.    ROS: + Phantom pain-improving, +leg swelling-fluctuating/improving Denies fevers, chills, CP, SOB, abd pain, N/V/D/C, new/worsening paresthesias/weakness, or any other complaints at this time.      Objective:   No results found. No results for input(s): "WBC", "HGB", "HCT", "PLT" in the last 72 hours.   Recent Labs    09/05/22 0621  NA 137  K 4.0  CL 108  CO2 24  GLUCOSE 116*  BUN 19  CREATININE 1.32*  CALCIUM 8.0*     Intake/Output Summary (Last 24 hours) at 09/07/2022 1150 Last data filed at 09/06/2022 2200 Gross per 24 hour  Intake 713 ml  Output --  Net 713 ml        Physical Exam: Vital Signs Blood pressure (!) 153/86, pulse 68, temperature (!) 97.5 F (36.4 C), temperature source Oral, resp. rate 20, height 6\' 4"  (1.93 m), weight (!) 159.1 kg, SpO2 97 %.  Constitutional: No distress . Vital signs reviewed. Sitting at EOB HEENT: NCAT, EOMI, oral membranes moist Neck: supple Cardiovascular: RRR without murmur. No JVD    Respiratory/Chest: CTA Bilaterally without wheezes or rales. Normal effort    GI/Abdomen: BS +, non-tender, non-distended Ext: no clubbing or cyanosis. +BLE edema (chronic lymphedema in RLE about 1+, 1+ LLE edema at knee/stump) Psych: pleasant and cooperative  Skin: dry, warm. Chronic changes distal RLE d/t lymphedema, 1+ edema. No sign of breakdown. 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 Normal insight and awareness. Intact Memory.  MSK: Chronic lymphedema RLE- appears similar to last week or maybe a bit better, LLE in  limb protector    Assessment/Plan: 1. Functional deficits  which require 3+ hours per day of interdisciplinary therapy in a comprehensive inpatient rehab setting. Physiatrist is providing close team supervision and 24 hour management of active medical problems listed below. Physiatrist and rehab team continue to assess barriers to discharge/monitor patient progress toward functional and medical goals  Care Tool:  Bathing    Body parts bathed by patient: Right arm, Left arm, Chest, Abdomen, Front perineal area, Face, Right upper leg, Left upper leg   Body parts bathed by helper: Right lower leg, Left lower leg, Buttocks Body parts n/a: Left lower leg   Bathing assist Assist Level: Minimal Assistance - Patient > 75%     Upper Body Dressing/Undressing Upper body dressing   What is the patient wearing?: Pull over shirt    Upper body assist Assist Level: Set up assist    Lower Body Dressing/Undressing Lower body dressing      What is the patient wearing?: Pants     Lower body assist Assist for lower body dressing: Minimal Assistance - Patient > 75%     Toileting Toileting Toileting Activity did not occur (Clothing management and hygiene only): N/A (no void or bm)  Toileting assist Assist for toileting: Minimal Assistance - Patient > 75%     Transfers  Chair/bed transfer  Transfers assist     Chair/bed transfer assist level: Supervision/Verbal cueing     Locomotion Ambulation   Ambulation assist   Ambulation activity did not occur: Safety/medical concerns          Walk 10 feet activity   Assist  Walk 10 feet activity did not occur: Safety/medical concerns        Walk 50 feet activity   Assist Walk 50 feet with 2 turns activity did not occur: Safety/medical concerns         Walk 150 feet activity   Assist Walk 150 feet activity did not occur: Safety/medical concerns         Walk 10 feet on uneven surface  activity   Assist Walk 10 feet on uneven surfaces activity did not occur: Safety/medical  concerns         Wheelchair     Assist Is the patient using a wheelchair?: Yes Type of Wheelchair: Manual    Wheelchair assist level: Supervision/Verbal cueing Max wheelchair distance: 130    Wheelchair 50 feet with 2 turns activity    Assist        Assist Level: Supervision/Verbal cueing   Wheelchair 150 feet activity     Assist      Assist Level: Supervision/Verbal cueing   Blood pressure (!) 153/86, pulse 68, temperature (!) 97.5 F (36.4 C), temperature source Oral, resp. rate 20, height 6\' 4"  (1.93 m), weight (!) 159.1 kg, SpO2 97 %.  Medical Problem List and Plan: 1. 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  Antithrombotics: -DVT/anticoagulation:  Pharmaceutical: Lovenox 80mg  QD             -antiplatelet therapy: N/A 3. Pain Management: Tylenol 650mg  TID, Oxycodone 5-10mg  q4h prn. Tramadol 50mg  q6h PRN.              -add robaxin 500mg  q6h prn for spasms.  -08/23/22 having phantom pain interfering with sleep, will add gabapentin 100mg  TID for now, monitor for effect/side effects.  4/15 reports pain controlled -4/17 increase gabapentin to  200mg  TID, Increase robaxin dose to 750mg  PRN -4/19 reports pain continues to improve, continue current regimen for now -08/30/22 gabapentin dosing never increased, will inc to 200mg  TID now since  -4/23 increased gabapentin dose to 300mg  TID -4/25 Increase gabapentin to 400mg  TID -4/27-28/24 Improving pain, cont regimen  4. Mood/Behavior/Sleep: LCSW to follow for evaluation and support.               -antipsychotic agents: N/A -08/23/22 poor sleep, ordered melatonin 5mg  QHS, also has trazodone PRN--improved 08/24/22 -4/24--sleep is fair to inconsistent -09/06/22 pt being awoken for VS, changed VS order to QShift to lessen early AM wake ups.  -09/07/22 sleep greatly improved, hopefully continues  5. Neuropsych/cognition: This patient is capable of making decisions on his own behalf. 6. Skin/Wound Care: Monitor wound for healing             --limb protector when up -he's post-op  day #7. Spoke with Dr Lajoyce Corners who recommended changing vac to prevena and continuing for another week.  -consider DC vac 4/21 -08/31/22 Prevena machine deactivated, d/c'd, asked nursing to apply kerlex/abd pad/ace wrap dressing, defer to primary team for further wound care orders. Also asked nursing to get TED hose for RLE -4/22 wound VAC has been removed  7. Fluids/Electrolytes/Nutrition: Monitor I/O. Continue Vitamin C and Zinc and MVI. Monitor routine Monday labs.              --add juven BID additionally -08/23/22 BMP with mild hyponatremia 134, otherwise fairly unremarkable, monitor on routine Monday labs -4/15 Na up to 137, improved -4/25 Na stable at 139, monitor routine labs  8. T2DM: Hgb A1c- 10.3 and poorly controlled. --dietician has added double portion as well as snacks TID to help with hunger/apptite             --monitor BS ac/hs and titrate insulin as indicated             --continue Insulin glargine 14 units QD and SSI  -4/16 fair control, continue current regimen for now  -4/17 monitor for now, consider increase glargine  -4/26 patient reports hypoglycemic symptoms when glucose was 101 this morning, will decrease glargine to 12 units -4/27-28/24 CBGs fairly well controlled, monitor for trend with new dosing CBG (last 3)  Recent Labs    09/06/22 2120 09/07/22 0619 09/07/22 1124  GLUCAP 124* 125* 143*    9. Dilated CM of unclear etiology: On Lasix 40mg  QD, Entresto 49-51mg  BID (dose increased  today>>d/c'd 4/16) and spironolactone 25mg  QD             --will add low salt restrictions. Monitor daily weights --add low dose KCL due to recurrent hypokalemia requiring intermittent supplementation --recheck BMET 04/13 and on 04/15 as on spiro -pt is having nausea and ?diarrhea with coreg increase. Will convert him to metoprolol 25mg  bid to start--->titrate as needed. -08/23/22 K+ 3.7; pt educated on med changes and importance of compliance. No weight done today, ordered daily weights.  -08/24/22 no wt again, asked nursing to please make sure daily wt done; slight increase in RLE swelling noted, but mild, monitor for now -4/16 Will contact cardiology regarding DC of entresto -4/18 Seen by cardiology appreciate assistance, increase lopressor to 50mg  BID -08/31/22 wt a little up, mild swelling in RLE, asked for TED hose for now -4/24 weights trending up.   Spironolactone was increased to 50mg  daily effective TODAY, recheck BMP thursday  -interestingly his RLE lymphedema looks better -4/25 Will decrease spironolactone back to 25mg  daily, hold tomorrow, will ask for wts to be checked standing if possible -4/25 will attempt to Keep potassium >4, give 1x dose today to help keep optimal since will hold spironolactone, advised leg elevation -4/26 will contact cardiology regarding recommendations, weight does appear increased although patient says they have been weighing him with heavy blankets.  Gabapentin could be possible cause of leg swelling also however will hold off on making changes to this currently. -09/06/22 pt resistant to med changes for now; cont current regimen, wt still uptrending a bit so monitor closely; swelling fairly stable from prior; monitor -09/07/22 Cardiology increased Spironolactone to 50mg  QD again, starting tomorrow 09/08/22  Filed Weights   09/05/22 0536 09/06/22 0544 09/07/22 0509  Weight: (!) 158.7 kg (!) 159.8 kg (!) 159.1 kg    10. HTN: Monitor BP TID--now on Lasix,  Entresto and Spironolactone.             -  metoprolol as above  -08/23/22 BPs elevated but recent change to meds, monitor for now  -4/15 mildly elevated, monitor for now -4/16 Entresto DC, will consider additional medication if BP remains elevated, will discuss with cardiology  -4/17 Cardiology notified, will contract cardiology again for possible different medication, cardiology plans to discuss with   -4/18 increase lopressor dose to 50mg  twice daily -4/19 blood pressures appear to be improving, continue monitor response -08/30/22  BPs still elevated but gradually improving, monitor trend -4/22  increase lopressor to 100mg  BID, heart rate can likely tolerate this -4/26 patient is declining Lopressor, encouraged use of medications, will also contact cardiology regarding possible medication adjustments -09/06/22 BPs so variable but hard to manage with pt refusing meds; resistant to changing meds; monitor but suspect it will be difficult to control with pt's noncompliance/resistance -09/07/22 Cardiology increased Spironolactone to 50mg  QD again, starting tomorrow 09/08/22 Vitals:   09/03/22 1957 09/04/22 0536 09/04/22 1308 09/04/22 1927  BP: (!) 166/95 (!) 151/85 (!) 147/86 (!) 156/88   09/05/22 0536 09/05/22 0712 09/05/22 1304 09/05/22 1949  BP: (!) 157/90 (!) 161/96 (!) 158/85 (!) 170/88   09/06/22 0544 09/06/22 1350 09/06/22 1948 09/07/22 0509  BP: (!) 155/87 (!) 153/82 138/68 (!) 153/86      11. Morbid obesity: Continue to educate on diet, exercise and compliance to help promote overall health and mobility.    12. Anemia:   -08/23/22 HGb 9.2, relatively stable; monitor on routine labs  -4/22 Hgb stable at 9.0, monitor on routine labs  13. Constipation: usually goes daily at home  -08/24/22 no BM in 2 days, will start miralax 17g QD for now; monitor  -08/30/22 LBM 2 days ago but doesn't want to change meds; monitor   -09/07/22 LBM last night, monitor on current regimen  14.  Chest pain and  nausea, improving  -Check EKG-NSR, Toponin- negative x3 -Pt reports this is related to his use of entresto, will DC this medication, He says he is feeling better now  -4/19 reports this has resolved since stopping Entresto  -Denies further chest pain  15. Azotemia -4/25 Decrease spironolactone back down to 25mg , hold dose tomorrow  -4/26 Cr still up at 1.32, spironolactone held today, will contact cardiology for recommendations -09/06/22 per cardiology, would be recommended to optimize GDMT but pt resistant; can f/up outpatient; monitor routine labs while here   LOS: 16 days A FACE TO [facility] EVALUATION WAS PERFORMED  [address] 09/07/2022, 11:50 AM

## 2022-09-08 LAB — CBC
HCT: 31.3 % — ABNORMAL LOW (ref 39.0–52.0)
Hemoglobin: 9.4 g/dL — ABNORMAL LOW (ref 13.0–17.0)
MCH: 23.9 pg — ABNORMAL LOW (ref 26.0–34.0)
MCHC: 30 g/dL (ref 30.0–36.0)
MCV: 79.4 fL — ABNORMAL LOW (ref 80.0–100.0)
Platelets: 310 10*3/uL (ref 150–400)
RBC: 3.94 MIL/uL — ABNORMAL LOW (ref 4.22–5.81)
RDW: 20 % — ABNORMAL HIGH (ref 11.5–15.5)
WBC: 6.4 10*3/uL (ref 4.0–10.5)
nRBC: 0 % (ref 0.0–0.2)

## 2022-09-08 LAB — BASIC METABOLIC PANEL
Anion gap: 5 (ref 5–15)
BUN: 19 mg/dL (ref 6–20)
CO2: 24 mmol/L (ref 22–32)
Calcium: 8.1 mg/dL — ABNORMAL LOW (ref 8.9–10.3)
Chloride: 107 mmol/L (ref 98–111)
Creatinine, Ser: 1.29 mg/dL — ABNORMAL HIGH (ref 0.61–1.24)
GFR, Estimated: >60 mL/min (ref >60)
Glucose, Bld: 158 mg/dL — ABNORMAL HIGH (ref 70–99)
Potassium: 4.1 mmol/L (ref 3.5–5.1)
Sodium: 136 mmol/L (ref 135–145)

## 2022-09-08 LAB — GLUCOSE, CAPILLARY
Glucose-Capillary: 163 mg/dL — ABNORMAL HIGH (ref 70–99)
Glucose-Capillary: 125 mg/dL — ABNORMAL HIGH (ref 70–99)
Glucose-Capillary: 234 mg/dL — ABNORMAL HIGH (ref 70–99)
Glucose-Capillary: 188 mg/dL — ABNORMAL HIGH (ref 70–99)

## 2022-09-08 MED ORDER — ZINC OXIDE 20 % EX OINT
TOPICAL_OINTMENT | CUTANEOUS | Status: DC | PRN
  Filled 2022-09-08 (×3): qty 28.35

## 2022-09-08 NOTE — Progress Notes (Signed)
Orthopedic Tech Progress Note Patient Details:  Alan Tapia. 04-Sep-1975 [id number]  Called in order to [facility] for a BKA SHRINKER   Patient ID: [name], male   DOB: [birth year], 47 y.o.   MRN: [id number]  Donald Pore 09/08/2022, 7:02 PM

## 2022-09-08 NOTE — Progress Notes (Signed)
Rounding Note    Patient Name: Alan Tapia. Date of Encounter: 09/08/2022  [facility] Cardiologist: Meriam Sprague, MD   Subjective   No CP, no SOB, in wheelchair. Amputated leg elevated.   Inpatient Medications    Scheduled Meds:  acetaminophen  650 mg Oral TID   vitamin C  1,000 mg Oral Daily   enoxaparin (LOVENOX) injection  80 mg Subcutaneous Q24H   furosemide  40 mg Oral Daily   gabapentin  400 mg Oral TID   hydrocerin   Topical BID   insulin aspart  0-5 Units Subcutaneous QHS   insulin aspart  0-9 Units Subcutaneous TID WC   insulin glargine-yfgn  12 Units Subcutaneous Daily   melatonin  5 mg Oral QHS   metoprolol tartrate  100 mg Oral BID   multivitamin with minerals  1 tablet Oral Daily   nutrition supplement (JUVEN)  1 packet Oral BID BM   polyethylene glycol  17 g Oral Daily   spironolactone  50 mg Oral Daily   Continuous Infusions:  PRN Meds: acetaminophen, alum & mag hydroxide-simeth, bisacodyl, guaiFENesin-dextromethorphan, hydrocortisone cream, methocarbamol, oxyCODONE, phenol, polyethylene glycol, prochlorperazine **OR** prochlorperazine **OR** prochlorperazine, sodium phosphate, traMADol, traZODone   Vital Signs    Vitals:   09/07/22 0509 09/07/22 1353 09/07/22 1920 09/08/22 0519  BP: (!) 153/86 (!) 172/88 (!) 157/85 (!) 159/84  Pulse: 68 83 84 74  Resp: 20 15 18 16   Temp: (!) 97.5 F (36.4 C) 97.8 F (36.6 C) 98.1 F (36.7 C) 97.8 F (36.6 C)  TempSrc: Oral  Oral Oral  SpO2: 97% 97% 100% 98%  Weight: (!) 159.1 kg   (!) 157.2 kg  Height:        Intake/Output Summary (Last 24 hours) at 09/08/2022 0850 Last data filed at 09/07/2022 2300 Gross per 24 hour  Intake 240 ml  Output --  Net 240 ml      09/08/2022    5:19 AM 09/07/2022    5:09 AM 09/06/2022    5:44 AM  Last 3 Weights  Weight (lbs) 346 lb 9 oz 350 lb 12 oz 352 lb 4.7 oz  Weight (kg) 157.2 kg 159.1 kg 159.8 kg      Physical Exam   GEN: No acute  distress.   Neck: No JVD Cardiac: RRR, no murmurs, rubs, or gallops.  Respiratory: Clear to auscultation bilaterally. GI: Soft, nontender, non-distended  MS: No edema; LBKA Neuro:  Nonfocal  Psych: Normal affect   Labs    High Sensitivity Troponin:   Recent Labs  Lab 08/26/22 0926 08/26/22 1142 08/26/22 1431  TROPONINIHS 4 4 5      Chemistry Recent Labs  Lab 09/04/22 0551 09/05/22 0621 09/08/22 0607  NA 139 137 136  K 4.0 4.0 4.1  CL 105 108 107  CO2 25 24 24   GLUCOSE 133* 116* 158*  BUN 17 19 19   CREATININE 1.28* 1.32* 1.29*  CALCIUM 8.3* 8.0* 8.1*  GFRNONAA >60 >60 >60  ANIONGAP 9 5 5     Lipids No results for input(s): "CHOL", "TRIG", "HDL", "LABVLDL", "LDLCALC", "CHOLHDL" in the last 168 hours.  Hematology Recent Labs  Lab 09/08/22 0607  WBC 6.4  RBC 3.94*  HGB 9.4*  HCT 31.3*  MCV 79.4*  MCH 23.9*  MCHC 30.0  RDW 20.0*  PLT 310   Thyroid No results for input(s): "TSH", "FREET4" in the last 168 hours.  BNPNo results for input(s): "BNP", "PROBNP" in the last 168 hours.  DDimer  No results for input(s): "DDIMER" in the last 168 hours.   Radiology    No results found.  Cardiac Studies   Echo 08/17/22   1. Left ventricular ejection fraction, by estimation, is 35 to 40%. The  left ventricle has moderately decreased function. The left ventricle  demonstrates global hypokinesis. The left ventricular internal cavity size  was mildly dilated. Indeterminate  diastolic filling due to E-A fusion.   2. Right ventricular systolic function is normal. The right ventricular  size is normal. Tricuspid regurgitation signal is inadequate for assessing  PA pressure.   3. Left atrial size was mildly dilated.   4. The mitral valve is grossly normal. Mild mitral valve regurgitation.   5. The aortic valve is tricuspid. Aortic valve regurgitation is not  visualized.   6. The inferior vena cava is dilated in size with <50% respiratory  variability, suggesting right  atrial pressure of 15 mmHg.   Patient Profile     47 y.o. male with a history of recently diagnosed HFrEF with EF of 35-40%, uncontrolled hypertension, uncontrolled type 2 diabetes mellitus, bilateral lower extremity lymphedema, chronic left foot ulcer, chronic anemia, morbid obesity, and medication non-compliance. He was admitted on 08/13/2022 for osteomyelitis and septic arthritis of left foot after presenting with increased swelling and drainage from chronic left foot ulcers. 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 Cardiology was initially consulted for evaluation of new systolic CHF with EF of 35-40% o   Assessment & Plan    Acute systolic HF EF 35-40%  -- not tolerated Entresto, Coreg, Lopressor, Jardiance (GI side effect). This leaves [place] with spironolactone.   -- Repeat ECHO in 3 months. If still reduced EF, plan ischemic work up. Likely etiology HTN.  Discussed with him.  HTN  -- spironolactone 50mg  now. K ok. Could consider Amlodipine if not tried previously.   Morbid obesity  -- continue to encourage weight loss.   LBKA  -- osteomyelitis.   Can consider further trial of medications as outpatient.  For questions or updates, please contact [facility] Please consult [email or web address] for contact info under        Signed, Donato Schultz, MD  09/08/2022, 8:50 AM

## 2022-09-08 NOTE — Progress Notes (Signed)
Occupational Therapy Session Note  Patient Details  Name: Alan Tapia. MRN: [id number] Date of Birth: 02/25/1976  Today's Date: 09/08/2022 OT Individual Time: 2130-8657 OT Individual Time Calculation (min): 60 min    Short Term Goals: Week 3:  OT Short Term Goal 1 (Week 3): Pt will complete sit > stand from the w/c with min A OT Short Term Goal 2 (Week 3): Pt will complete all toileting tasks with CGA OT Short Term Goal 3 (Week 3): Pt will demonstrate carryover of all residual limb care with (S)  Skilled Therapeutic Interventions/Progress Updates:    Extensive time spent prior to session problem solving through managing pt's discharge with social work, including equipment needs, insurance coverage limiting possibility of custom chair, and possible extension. Discussed all of these at length with pt. Pt to let therapist know by end of day if he would be open to this or not (no promise of this happening, just discussion starting). He completed w/c propulsion to the therapy gym with improved endurance, no rest breaks, mod I. Squat pivot/lateral scoot combo to the mat with mod I. He completed 4x10 sit > stand from the mat with standing level tricep extension and heel raise for carryover into hopping. Also remained standing for LLE kick backs and lateral lifts to strengthen glutes for upright standing. He completed 3x stand pivot transfers (without returning to sitting) with min A overall. He returned to his room and was left sitting up with all needs met.   6/10 pain in his R knee throughout session- rest breaks required for pain intervention.   Therapy Documentation Precautions:  Precautions Precautions: Fall, Other (comment) (wound vac) Required Braces or Orthoses: Other Brace Other Brace: L limb guard Restrictions Weight Bearing Restrictions: Yes LLE Weight Bearing: Non weight bearing   Therapy/Group: Individual Therapy  Crissie Reese 09/08/2022, 6:46 AM

## 2022-09-08 NOTE — Progress Notes (Signed)
Physical Therapy Session Note  Patient Details  Name: Alan Tapia. MRN: [id number] Date of Birth: Dec 17, 1975  Today's Date: 09/08/2022 PT Individual Time: 0800-0855, [id number] PT Individual Time Calculation (min): 55 min, 75 min  Short Term Goals: Week 2:  PT Short Term Goal 1 (Week 2): Pt ambulate 5 feet with LRAD and mod A PT Short Term Goal 2 (Week 2): pt will perform stand pivot transfer with CGA PT Short Term Goal 3 (Week 2): pt will perform sit<>stand from [facility] level  with mod A  Skilled Therapeutic Interventions/Progress Updates:      Treatment Session 1  Pt seated in WC upon arrival. Pt agreeable to therapy. Pt denies any pain.   Pt performed lateral scoot transfer x4 WC to mat table with emphasis on not kicking out R LE, and utilizing head hip ratio. Pt attempted but demos poor clearance and ultimately resorted to kicking out R LE for momentum.   Pt performed tricep dips 2x10 holding 2-3 seconds at top while seated EOM with step up block positioned on each side of pt.   Pt performed 2x10 glute bridges with L LE elevated on swiss ball, verbal cues provided for emphasis on glute activation for improved clearance.   Pt performed sit<>stand x10 from elevated mat table (progressively lowering), with verbal and tactile cues and demo for forward trunk lean for momentum versus kicking out R LE. Pt able to perform with forward trunk lean for momentum and low mat table with therapist blocking R LE from moving forward and CGA to stabilize walker, pt performed x5 in this manner. As table progressively lowered, pt resorted back to kicking out R LE, as pt trialed with forward trunk lean and therapist blocking R LE with mod A but unable to get adequate clearance.   Pt performed stand pivot transfer mat table<>WC with RW and CGA, verbal cuing provided for technique for hop backwards.   Pt seated in WC at end of session with all needs within reach and wife in room.   Treatment Session 2    Pt seated in WC at start of session with wife in room. Pt agreeable to therapy. Pt denies any pain. Wife present throughout session for family training.   Pt performed stand pivot transfer with RW from [facility] to car simulator (at height of truck), with min A for sit<>stand and CGA for pivot to stabilize RW. Pt initially requiring min A for raising L LE into car, but able to do with supervision with verbal cues for self UE assist. Pt performed 2nd trial of car transfer with wife providing assistance, and therapist providing verbal cues to wife regarding hand placement for safety.   Discussed home renovations, pt wife reports they will be able to do everything from first level but have one step to get inside and are still awaiting to hear back for ramp installation. Discussed option for getting into house without ramp using stand pivot transfer with WC placed on step after pt stands as pt wife and pt reports that is how they did it at baseline.   Pt performed sit<>stand from [facility] with RW with min A. Pt stood with supervision with RW while therapist placed WC onto curb step (to simulate pt's home step). Pt performed stand pivot with CGA with RW. Pt performed stand <>sit onto RW with CGA to stabilize walker. Pt performed sit <>stand from [facility] with RW and CGA to stabilize WC. Pt performed stand pivot transfer with RW and CGA. Pt  wife returned demonstration with therapist providing verbal cues for wife for positioning and sequencing for safety.   Discussed pt could utilize method practiced to get inside of house, or could transfer to another chair of similar height as WC with arm rests, or could transfer to [facility] using same method, to reduce fatigue, and allow pt wife more time to lift WC onto step. Pt veryablized understanding.   Discussed best method for home entry is still ramp for time and energy conservation but chair method is a temporary solution.  Education provided to take rest breaks as needed for fatigue,  eduction provided to wife to encourage rest breaks to improve pt performance. Pt and pt wife verbalized understanding.    Therapist demos how to fold new WC to make it easier to lift.   Pt asking questions about follow up therapy in CIR to receive prosthesis. Education provided on prosthesis will be in outpatient, it will not occur in CIR. Recommended doing paper work for insurance to ensure coverage for prosthesis as pt current insurance will not cover it.   Pt seated in WC at end of session with all needs within reach and wife in room.   Therapy Documentation Precautions:  Precautions Precautions: Fall, Other (comment) (wound vac) Required Braces or Orthoses: Other Brace Other Brace: L limb guard Restrictions Weight Bearing Restrictions: Yes LLE Weight Bearing: Non weight bearing   Therapy/Group: Individual Therapy  [facility] Ambrose Finland, [place], DPT  09/08/2022, 12:16 PM

## 2022-09-08 NOTE — Progress Notes (Signed)
Patient ID: Alan Tapia., male   DOB: [birth year], 47 y.o.   MRN: [id number]  Covering for primary SW, Becky D.   SW met with pt and spouse to discuss DME. Pt and spouse aware that a credit/bank card will need to be on file to receive DME under hardship/charity. [employer] reports the pt expressed that he nor anyone currently has a card to put on file. Sw spoke with spouse to discuss the need for a card. Spouse expressed she did not receive a phone call. Sw reached out to [employer] to have them contact spouse in the room to obtain card information.  Spouse has also confirmed that they will have transportation on the day on discharge. Pt and spouse will have a rental car. No additional questions or concerns.

## 2022-09-08 NOTE — Progress Notes (Signed)
Patient ID: Alan Tapia., male   DOB: 11/03/1975, 47 y.o.   MRN: [id number]  Covering for primary SW, Becky D.  Bari Rw ordered through [employer].

## 2022-09-08 NOTE — Progress Notes (Signed)
PROGRESS NOTE   Subjective/Complaints:  Working in therapy in the gym.  No new complaints this morning.  His wife did report he had some skin irritation around his left knee.  Patient reports this has improved and was where the top of the shrinker was rubbing on his leg.  His wife also reported he will occasionally have shortness of breath at night however patient denies having this symptom and reports no concerns regarding his breathing.  He was seen by cardiology this morning also.  ROS: + Phantom pain-improving, +leg swelling-fluctuating/improving Denies fevers, chills, CP, SOB, abd pain, N/V/D/C, new/worsening paresthesias/weakness, or any other complaints at this time.      Objective:   No results found. Recent Labs    09/08/22 0607  WBC 6.4  HGB 9.4*  HCT 31.3*  PLT 310     Recent Labs    09/08/22 0607  NA 136  K 4.1  CL 107  CO2 24  GLUCOSE 158*  BUN 19  CREATININE 1.29*  CALCIUM 8.1*      Intake/Output Summary (Last 24 hours) at 09/08/2022 1329 Last data filed at 09/07/2022 2300 Gross per 24 hour  Intake 240 ml  Output --  Net 240 ml         Physical Exam: Vital Signs Blood pressure (!) 159/84, pulse 74, temperature 97.8 F (36.6 C), temperature source Oral, resp. rate 16, height 6\' 4"  (1.93 m), weight (!) 157.2 kg, SpO2 98 %.  Constitutional: No distress . Vital signs reviewed.  Working with therapy in the gym HEENT: NCAT, EOMI, oral membranes moist Neck: supple Cardiovascular: RRR without murmur. No JVD    Respiratory/Chest: CTA Bilaterally without wheezes or rales. Normal effort    GI/Abdomen: BS +, non-tender, non-distended Ext: no clubbing or cyanosis. +BLE edema (chronic lymphedema in RLE about 1+, 1+ LLE edema at knee/stump) Psych: pleasant and cooperative  Skin: dry, warm. Chronic changes distal RLE d/t lymphedema, No sign of breakdown. 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 Normal insight and awareness. Intact Memory.  Follows commands, cranial nerves II through XII grossly intact MSK:  LLE in  limb protector, no abnormal tone noted    Assessment/Plan: 1. Functional deficits which require 3+ hours per day of interdisciplinary therapy in a comprehensive inpatient rehab setting. Physiatrist is providing close team supervision and 24 hour management of active medical problems listed below. Physiatrist and rehab team continue to assess barriers to discharge/monitor patient progress toward functional and medical goals  Care Tool:  Bathing    Body parts bathed by patient: Right arm, Left arm, Chest, Abdomen, Front perineal area, Face, Right upper leg, Left upper leg   Body parts bathed by helper: Right lower leg, Left lower leg, Buttocks Body parts n/a: Left lower leg   Bathing assist Assist Level: Minimal Assistance - Patient > 75%     Upper Body Dressing/Undressing Upper body dressing   What is the patient wearing?: Pull over shirt    Upper body assist Assist Level: Set up assist    Lower Body Dressing/Undressing Lower body dressing      What is the patient wearing?: Pants  Lower body assist Assist for lower body dressing: Minimal Assistance - Patient > 75%     Toileting Toileting Toileting Activity did not occur Press photographer and hygiene only): N/A (no void or bm)  Toileting assist Assist for toileting: Minimal Assistance - Patient > 75%     Transfers Chair/bed transfer  Transfers assist     Chair/bed transfer assist level: Supervision/Verbal cueing     Locomotion Ambulation   Ambulation assist   Ambulation activity did not occur: Safety/medical concerns          Walk 10 feet activity   Assist  Walk 10 feet activity did not occur: Safety/medical concerns        Walk 50 feet activity   Assist Walk 50 feet with 2 turns activity did not occur: Safety/medical  concerns         Walk 150 feet activity   Assist Walk 150 feet activity did not occur: Safety/medical concerns         Walk 10 feet on uneven surface  activity   Assist Walk 10 feet on uneven surfaces activity did not occur: Safety/medical concerns         Wheelchair     Assist Is the patient using a wheelchair?: Yes Type of Wheelchair: Manual    Wheelchair assist level: Supervision/Verbal cueing Max wheelchair distance: 130    Wheelchair 50 feet with 2 turns activity    Assist        Assist Level: Supervision/Verbal cueing   Wheelchair 150 feet activity     Assist      Assist Level: Supervision/Verbal cueing   Blood pressure (!) 159/84, pulse 74, temperature 97.8 F (36.6 C), temperature source Oral, resp. rate 16, height 6\' 4"  (1.93 m), weight (!) 157.2 kg, SpO2 98 %.  Medical Problem List and Plan: 1. 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  Antithrombotics: -DVT/anticoagulation:  Pharmaceutical: Lovenox 80mg  QD             -antiplatelet therapy: N/A 3. Pain Management: Tylenol 650mg  TID, Oxycodone 5-10mg  q4h prn. Tramadol 50mg  q6h PRN.              -add robaxin 500mg  q6h prn for spasms.  -08/23/22 having phantom pain interfering with sleep, will add gabapentin 100mg  TID for now, monitor for effect/side effects.  4/15 reports pain controlled -4/17 increase gabapentin to  200mg  TID, Increase robaxin dose to 750mg  PRN -4/19 reports  pain continues to improve, continue current regimen for now -08/30/22 gabapentin dosing never increased, will inc to 200mg  TID now since  -4/23 increased gabapentin dose to 300mg  TID -4/25 Increase gabapentin to 400mg  TID -4/29 he reports pain is controlled today, continue current medications  4. Mood/Behavior/Sleep: LCSW to follow for evaluation and support.              -antipsychotic agents: N/A -08/23/22 poor sleep, ordered melatonin 5mg  QHS, also has  trazodone PRN--improved 08/24/22 -4/24--sleep is fair to inconsistent -09/06/22 pt being awoken for VS, changed VS order to QShift to lessen early AM wake ups.  -09/07/22 sleep greatly improved, hopefully continues  5. Neuropsych/cognition: This patient is capable of making decisions on his own behalf. 6. Skin/Wound Care: Monitor wound for healing             --limb protector when up -he's post-op day #7. Spoke with Dr Lajoyce Corners who recommended changing vac to prevena and continuing for another week.  -consider DC vac 4/21 -08/31/22 Prevena machine deactivated, d/c'd, asked nursing to apply kerlex/abd pad/ace wrap dressing, defer to primary team for further wound care orders. Also asked nursing to get TED hose for RLE -4/22 wound VAC has been removed  7. Fluids/Electrolytes/Nutrition: Monitor I/O. Continue Vitamin C and Zinc and MVI. Monitor routine Monday labs.              --add juven BID additionally -08/23/22 BMP with mild hyponatremia 134, otherwise fairly unremarkable, monitor on routine Monday labs -4/15 Na up to 137, improved -4/25 Na stable at 139, monitor routine labs  8. T2DM: Hgb A1c- 10.3 and poorly controlled. --dietician has added double portion as well as snacks TID to help with hunger/apptite             --monitor BS ac/hs and titrate insulin as indicated             --continue Insulin glargine 14 units QD and SSI  -4/16 fair control, continue current regimen for now  -4/17 monitor for now, consider increase glargine  -4/26  patient reports hypoglycemic symptoms when glucose was 101 this morning, will decrease glargine to 12 units -4/29 controlled, continue current regimen CBG (last 3)  Recent Labs    09/07/22 2052 09/08/22 0602 09/08/22 1136  GLUCAP 144* 163* 125*     9. Dilated CM of unclear etiology: On Lasix 40mg  QD, Entresto 49-51mg  BID (dose increased today>>d/c'd 4/16) and spironolactone 25mg  QD             --will add low salt restrictions. Monitor daily weights --add low dose KCL due to recurrent hypokalemia requiring intermittent supplementation --recheck BMET 04/13 and on 04/15 as on spiro -pt is having nausea and ?diarrhea with coreg increase. Will convert him to metoprolol 25mg  bid to start--->titrate as needed. -08/23/22 K+ 3.7; pt educated on med changes and importance of compliance. No weight done today, ordered daily weights.  -08/24/22 no wt again, asked nursing to please make sure daily wt done; slight increase in RLE swelling noted, but mild, monitor for now -4/16 Will contact cardiology regarding DC of entresto -4/18 Seen by cardiology appreciate assistance, increase lopressor to 50mg  BID -08/31/22 wt a little up, mild swelling in RLE, asked for TED hose for now -4/24 weights trending up.   Spironolactone was increased to 50mg  daily effective TODAY, recheck BMP thursday  -interestingly his RLE lymphedema looks better -4/25 Will decrease spironolactone back to 25mg  daily, hold tomorrow, will ask for wts to be checked standing if possible -4/25 will attempt to Keep potassium >4, give 1x dose today to help keep optimal since will hold spironolactone, advised leg elevation -4/26 will contact cardiology regarding recommendations, weight does appear increased although patient says they have been weighing him with heavy blankets.  Gabapentin could be possible cause of leg swelling also however will hold off on making changes to this currently. -09/06/22 pt resistant to med changes for now; cont current  regimen, wt still uptrending  a bit so monitor closely; swelling fairly stable from prior; monitor -09/07/22 Cardiology increased Spironolactone to 50mg  QD again, starting tomorrow 09/08/22 -4/29, cardiology continuing 50 mg dose of spironolactone  Filed Weights   09/06/22 0544 09/07/22 0509 09/08/22 0519  Weight: (!) 159.8 kg (!) 159.1 kg (!) 157.2 kg    10. HTN: Monitor BP TID--now on Lasix, Entresto and Spironolactone.             -metoprolol as above  -08/23/22 BPs elevated but recent change to meds, monitor for now  -4/15 mildly elevated, monitor for now -4/16 Entresto DC, will consider additional medication if BP remains elevated, will discuss with cardiology  -4/17 Cardiology notified, will contract cardiology again for possible different medication, cardiology plans to discuss with   -4/18 increase lopressor dose to 50mg  twice daily -4/19 blood pressures appear to be improving, continue monitor response -08/30/22  BPs still elevated but gradually improving, monitor trend -4/22  increase lopressor to 100mg  BID, heart rate can likely tolerate this -4/26 patient is declining Lopressor, encouraged use of medications, will also contact cardiology regarding possible medication adjustments -09/06/22 BPs so variable but hard to manage with pt refusing meds; resistant to changing meds; monitor but suspect it will be difficult to control with pt's noncompliance/resistance -09/07/22 Cardiology increased Spironolactone to 50mg  QD again, starting tomorrow 09/08/22 4/29 cardiology considering amlodipine if BP does not remain controlled Vitals:   09/04/22 1927 09/05/22 0536 09/05/22 0712 09/05/22 1304  BP: (!) 156/88 (!) 157/90 (!) 161/96 (!) 158/85   09/05/22 1949 09/06/22 0544 09/06/22 1350 09/06/22 1948  BP: (!) 170/88 (!) 155/87 (!) 153/82 138/68   09/07/22 0509 09/07/22 1353 09/07/22 1920 09/08/22 0519  BP: (!) 153/86 (!) 172/88 (!) 157/85 (!) 159/84      11. Morbid obesity: Continue to  educate on diet, exercise and compliance to help promote overall health and mobility.    12. Anemia:   -08/23/22 HGb 9.2, relatively stable; monitor on routine labs  -4/22 Hgb stable at 9.0, monitor on routine labs  13. Constipation: usually goes daily at home  -08/24/22 no BM in 2 days, will start miralax 17g QD for now; monitor  -08/30/22 LBM 2 days ago but doesn't want to change meds; monitor   -4/29 last BM on 4/27, consider additional medication if no BM today   14.  Chest pain and nausea, improving  -Check EKG-NSR, Toponin- negative x3 -Pt reports this is related to his use of entresto, will DC this medication, He says he is feeling better now  -4/19 reports this has resolved since stopping Entresto  -Denies further chest pain  15. Azotemia -4/25 Decrease spironolactone back down to 25mg , hold dose tomorrow  -4/26 Cr still up at 1.32, spironolactone held today, will contact cardiology for recommendations -09/06/22 per cardiology, would be recommended to optimize GDMT but pt resistant; can f/up outpatient; monitor routine labs while here -4/29 creatinine still little elevated at 1.29, diuretics per cardiology   LOS: 17 days A FACE TO FACE EVALUATION WAS PERFORMED  Fanny Dance 09/08/2022, 1:29 PM

## 2022-09-09 ENCOUNTER — Other Ambulatory Visit (HOSPITAL_COMMUNITY): Payer: Self-pay

## 2022-09-09 DIAGNOSIS — I420 Dilated cardiomyopathy: Secondary | ICD-10-CM

## 2022-09-09 DIAGNOSIS — S88112D Complete traumatic amputation at level between knee and ankle, left lower leg, subsequent encounter: Secondary | ICD-10-CM

## 2022-09-09 DIAGNOSIS — E1165 Type 2 diabetes mellitus with hyperglycemia: Secondary | ICD-10-CM

## 2022-09-09 DIAGNOSIS — K5900 Constipation, unspecified: Secondary | ICD-10-CM

## 2022-09-09 DIAGNOSIS — Z794 Long term (current) use of insulin: Secondary | ICD-10-CM

## 2022-09-09 LAB — GLUCOSE, CAPILLARY
Glucose-Capillary: 153 mg/dL — ABNORMAL HIGH (ref 70–99)
Glucose-Capillary: 137 mg/dL — ABNORMAL HIGH (ref 70–99)
Glucose-Capillary: 152 mg/dL — ABNORMAL HIGH (ref 70–99)
Glucose-Capillary: 208 mg/dL — ABNORMAL HIGH (ref 70–99)

## 2022-09-09 MED ORDER — AMLODIPINE BESYLATE 10 MG PO TABS
10.0000 mg | ORAL_TABLET | Freq: Every day | ORAL | Status: DC
  Administered 2022-09-09: 10 mg via ORAL
  Filled 2022-09-09: qty 1

## 2022-09-09 MED ORDER — INSULIN ASPART 100 UNIT/ML IJ SOLN
0.0000 [IU] | Freq: Three times a day (TID) | INTRAMUSCULAR | Status: DC
  Administered 2022-09-09: 2 [IU] via SUBCUTANEOUS
  Administered 2022-09-09: 3 [IU] via SUBCUTANEOUS
  Administered 2022-09-10 (×2): 2 [IU] via SUBCUTANEOUS

## 2022-09-09 MED ORDER — GABAPENTIN 400 MG PO CAPS
400.0000 mg | ORAL_CAPSULE | Freq: Three times a day (TID) | ORAL | 0 refills | Status: DC
  Filled 2022-09-09: qty 90, 30d supply, fill #0

## 2022-09-09 MED ORDER — INSULIN ASPART 100 UNIT/ML FLEXPEN
0.0000 [IU] | PEN_INJECTOR | Freq: Three times a day (TID) | SUBCUTANEOUS | 11 refills | Status: DC
  Filled 2022-09-09: qty 9, 27d supply, fill #0

## 2022-09-09 MED ORDER — MELATONIN 5 MG PO TABS
5.0000 mg | ORAL_TABLET | Freq: Every day | ORAL | 0 refills | Status: DC
  Filled 2022-09-09: qty 30, 30d supply, fill #0

## 2022-09-09 MED ORDER — ACETAMINOPHEN 325 MG PO TABS
650.0000 mg | ORAL_TABLET | Freq: Three times a day (TID) | ORAL | 0 refills | Status: DC
  Filled 2022-09-09 – 2022-09-26 (×2): qty 120, 20d supply, fill #0

## 2022-09-09 MED ORDER — METHOCARBAMOL 750 MG PO TABS
750.0000 mg | ORAL_TABLET | Freq: Four times a day (QID) | ORAL | 0 refills | Status: DC | PRN
  Filled 2022-09-09: qty 60, 15d supply, fill #0

## 2022-09-09 MED ORDER — SPIRONOLACTONE 50 MG PO TABS
50.0000 mg | ORAL_TABLET | Freq: Every day | ORAL | 0 refills | Status: DC
  Filled 2022-09-09: qty 30, 30d supply, fill #0

## 2022-09-09 MED ORDER — INSULIN PEN NEEDLE 32G X 4 MM MISC
1.0000 | Freq: Every day | 0 refills | Status: DC
  Filled 2022-09-09: qty 100, 30d supply, fill #0

## 2022-09-09 MED ORDER — ZINC OXIDE 20 % EX OINT
TOPICAL_OINTMENT | CUTANEOUS | 0 refills | Status: DC | PRN
  Filled 2022-09-09: qty 56.7, fill #0

## 2022-09-09 MED ORDER — OXYCODONE HCL 5 MG PO TABS
2.5000 mg | ORAL_TABLET | Freq: Every day | ORAL | 0 refills | Status: DC | PRN
  Filled 2022-09-09: qty 7, 7d supply, fill #0

## 2022-09-09 MED ORDER — INSULIN GLARGINE 100 UNIT/ML SOLOSTAR PEN
14.0000 [IU] | PEN_INJECTOR | Freq: Every day | SUBCUTANEOUS | 11 refills | Status: DC
  Filled 2022-09-09: qty 15, 107d supply, fill #0

## 2022-09-09 MED ORDER — SEMGLEE 100 UNIT/ML ~~LOC~~ SOPN
12.0000 [IU] | PEN_INJECTOR | Freq: Every day | SUBCUTANEOUS | 0 refills | Status: DC
  Filled 2022-09-09: qty 3, 25d supply, fill #0

## 2022-09-09 MED ORDER — METOPROLOL TARTRATE 100 MG PO TABS
100.0000 mg | ORAL_TABLET | Freq: Two times a day (BID) | ORAL | 0 refills | Status: DC
  Filled 2022-09-09: qty 60, 30d supply, fill #0

## 2022-09-09 MED ORDER — INSULIN ASPART 100 UNIT/ML FLEXPEN
6.0000 [IU] | PEN_INJECTOR | Freq: Three times a day (TID) | SUBCUTANEOUS | 0 refills | Status: DC
  Filled 2022-09-09: qty 15, fill #0

## 2022-09-09 MED ORDER — INSULIN ASPART 100 UNIT/ML IJ SOLN
0.0000 [IU] | Freq: Three times a day (TID) | INTRAMUSCULAR | 11 refills | Status: DC
  Filled 2022-09-09: qty 10, 22d supply, fill #0

## 2022-09-09 MED ORDER — PEN NEEDLES 32G X 6 MM MISC
1.0000 | Freq: Four times a day (QID) | 0 refills | Status: DC
  Filled 2022-09-09: qty 120, fill #0

## 2022-09-09 MED ORDER — FUROSEMIDE 40 MG PO TABS
40.0000 mg | ORAL_TABLET | Freq: Every day | ORAL | 0 refills | Status: DC
  Filled 2022-09-09: qty 30, 30d supply, fill #0

## 2022-09-09 MED ORDER — INSULIN ASPART 100 UNIT/ML IJ SOLN
0.0000 [IU] | Freq: Every day | INTRAMUSCULAR | Status: DC
  Administered 2022-09-09: 5 [IU] via SUBCUTANEOUS

## 2022-09-09 MED ORDER — ASCORBIC ACID 1000 MG PO TABS
1000.0000 mg | ORAL_TABLET | Freq: Every day | ORAL | 0 refills | Status: DC
  Filled 2022-09-09: qty 30, 30d supply, fill #0

## 2022-09-09 MED ORDER — HYDROCERIN EX CREA
1.0000 | TOPICAL_CREAM | Freq: Two times a day (BID) | CUTANEOUS | 0 refills | Status: DC
  Filled 2022-09-09 – 2022-09-26 (×2): qty 454, 57d supply, fill #0

## 2022-09-09 MED ORDER — POLYETHYLENE GLYCOL 3350 17 GM/SCOOP PO POWD
17.0000 g | Freq: Every day | ORAL | 0 refills | Status: DC
  Filled 2022-09-09: qty 238, 14d supply, fill #0

## 2022-09-09 MED ORDER — TRAZODONE HCL 50 MG PO TABS
25.0000 mg | ORAL_TABLET | Freq: Every evening | ORAL | 0 refills | Status: DC | PRN
  Filled 2022-09-09: qty 15, 15d supply, fill #0

## 2022-09-09 MED ORDER — INSULIN LISPRO 200 UNIT/ML ~~LOC~~ SOPN
6.0000 [IU] | PEN_INJECTOR | Freq: Three times a day (TID) | SUBCUTANEOUS | 0 refills | Status: DC
  Filled 2022-09-09: qty 5, fill #0

## 2022-09-09 NOTE — Progress Notes (Signed)
Occupational Therapy Session Note  Patient Details  Name: Alan Tapia. MRN: [id number] Date of Birth: Apr 23, 1976  Today's Date: 09/09/2022 OT Individual Time: 0848-1000 OT Individual Time Calculation (min): 72 min    Short Term Goals: Week 3:  OT Short Term Goal 1 (Week 3): Pt will complete sit > stand from the w/c with min A OT Short Term Goal 2 (Week 3): Pt will complete all toileting tasks with CGA OT Short Term Goal 3 (Week 3): Pt will demonstrate carryover of all residual limb care with (S)  Skilled Therapeutic Interventions/Progress Updates:    Pt received sitting in the w/c with moderate unrated pain in his residual limb and R knee. Family education session completed with pt and his wife [name]. Verbal education provided re fall risk reduction, energy conservation strategies, home carryover of transfer training, ADLs, and IADLs- including (S) level, toileting hygiene and transfers, and shower transfers. They both declined hands on practice as they have been doing this and practiced with PT yesterday. Coordinated care with PA on type of residual limb wrapping to perform. At this time she recommended ace wrap over shrinker. Performed figure 8 ace wrapping and provided pt with both a practice model and handouts to practice today. He propelled the w/c to the therapy gym with slow pace but mod I. He completed sit > stand with several attempts d/t poor positioning but eventually needing only min A to stand with the HDRW. He completed 2x10 mini squats with min A. He returned to his room and was left sitting up with all needs met.    Therapy Documentation Precautions:  Precautions Precautions: Fall, Other (comment) (wound vac) Required Braces or Orthoses: Other Brace Other Brace: L limb guard Restrictions Weight Bearing Restrictions: Yes LLE Weight Bearing: Non weight bearing  Therapy/Group: Individual Therapy  Crissie Reese 09/09/2022, 6:44 AM

## 2022-09-09 NOTE — Plan of Care (Signed)
  Problem: RH Car Transfers Goal: LTG Patient will perform car transfers with assist (PT) Description: LTG: Patient will perform car transfers with assistance (PT). Outcome: Adequate for Discharge   Problem: RH Ambulation Goal: LTG Patient will ambulate in home environment (PT) Description: LTG: Patient will ambulate in home environment, # of feet with assistance (PT). Outcome: Adequate for Discharge   Problem: RH Car Transfers Goal: LTG Patient will perform car transfers with assist (PT) Description: LTG: Patient will perform car transfers with assistance (PT). Outcome: Adequate for Discharge   Problem: RH Ambulation Goal: LTG Patient will ambulate in home environment (PT) Description: LTG: Patient will ambulate in home environment, # of feet with assistance (PT). Outcome: Adequate for Discharge   Problem: RH Balance Goal: LTG Patient will maintain dynamic sitting balance (PT) Description: LTG:  Patient will maintain dynamic sitting balance with assistance during mobility activities (PT) Outcome: Completed/Met Goal: LTG Patient will maintain dynamic standing balance (PT) Description: LTG:  Patient will maintain dynamic standing balance with assistance during mobility activities (PT) Outcome: Completed/Met   Problem: Sit to Stand Goal: LTG:  Patient will perform sit to stand with assistance level (PT) Description: LTG:  Patient will perform sit to stand with assistance level (PT) Outcome: Completed/Met Note: Supervision from elevated surface (such as elevated bed), min A from WC level    Problem: RH Bed Mobility Goal: LTG Patient will perform bed mobility with assist (PT) Description: LTG: Patient will perform bed mobility with assistance, with/without cues (PT). Outcome: Completed/Met   Problem: RH Bed to Chair Transfers Goal: LTG Patient will perform bed/chair transfers w/assist (PT) Description: LTG: Patient will perform bed to chair transfers with assistance  (PT). Outcome: Completed/Met Note: Lateral scoot transfer and stand pivot    Problem: RH Wheelchair Mobility Goal: LTG Patient will propel w/c in controlled environment (PT) Description: LTG: Patient will propel wheelchair in controlled environment, # of feet with assist (PT) Outcome: Completed/Met Goal: LTG Patient will propel w/c in home environment (PT) Description: LTG: Patient will propel wheelchair in home environment, # of feet with assistance (PT). Outcome: Completed/Met

## 2022-09-09 NOTE — Discharge Summary (Signed)
Physician Discharge Summary  Patient ID: Alan Tapia. MRN: [id number] DOB/AGE: [birth year] 47 y.o.  Admit date: 08/22/2022 Discharge date: 09/10/2022  Discharge Diagnoses:  Principal Problem:   Below-knee amputation of left lower extremity (HCC) Active Problems:   Dilated cardiomyopathy (HCC)   Acute systolic CHF (congestive heart failure) (HCC)   Primary hypertension   Adjustment disorder   Phantom pain   Azotemia   Depressed left ventricular ejection fraction   Type 2 diabetes mellitus with hyperglycemia, with long-term current use of insulin [facility])   Discharged Condition: stable  Significant Diagnostic Studies: N/A   Labs:  Basic Metabolic Panel: Recent Labs  Lab 09/04/22 0551 09/05/22 0621 09/08/22 0607 09/10/22 0621  NA 139 137 136 137  K 4.0 4.0 4.1 4.1  CL 105 108 107 108  CO2 25 24 24 24   GLUCOSE 133* 116* 158* 146*  BUN 17 19 19 20   CREATININE 1.28* 1.32* 1.29* 1.28*  CALCIUM 8.3* 8.0* 8.1* 8.2*    CBC:    Latest Ref Rng & Units 09/08/2022    6:07 AM 09/01/2022    5:43 AM 08/25/2022    6:48 AM  CBC  WBC 4.0 - 10.5 K/uL 6.4  6.5  7.0   Hemoglobin 13.0 - 17.0 g/dL 9.4  9.0  9.2   Hematocrit 39.0 - 52.0 % 31.3  30.8  31.0   Platelets 150 - 400 K/uL 310  355  396      CBG: Recent Labs  Lab 09/09/22 1137 09/09/22 1644 09/09/22 2051 09/10/22 0612 09/10/22 1121  GLUCAP 137* 152* 208* 147* 150*    Brief HPI:   Alan Pollio. is a 47 y.o. male with history of HTN, T2DM, COVID-19, morbid obesity-BMI 42, left foot wound x 2 years with ray amputation last debrided earlier this year with recommendations for IV antibiotics.  He reported PICC and he missed antibiotics x 4 weeks as he had to come to [place] due to family emergency.  He was admitted on 08/13/2022 with increasing drainage and swelling of foot.  MRI foot showed large plantar wound with fluid collection/abscess and osteomyelitis of fifth proximal phalanx and entire fifth MTP septic arthritis  versus osteo fourth MTP as well as question of osteo of cuboid.  He was started on IV antibiotics for acute on chronic osteomyelitis and underwent left BKA on 04/05 by Dr. Lajoyce Corners.  2D echo done due to chronic BLE lymphedema as well as reports of shortness of breath and intermittent chest pain.  This revealed EF of 35 to 40% with global hypokinesis and mild dilatation of LV/LA.  He was started on GDMT due to suspicion of CHF and recommended not adding SGLT2i until DM better controlled.  Coreg was discontinued due to side effects and he was transition to metoprolol prior to discharge.  RDN heart failure pharmacy has been following for education as well as input.  Meal coverage was added for better blood sugar control.  PT/OT was working with patient to continue to be limited by weakness and limb loss.  CIR was recommended due to functional decline.   Hospital Course: Alan Obara. was admitted to rehab 08/22/2022 for inpatient therapies to consist of PT and OT at least three hours five days a week. Past admission physiatrist, therapy team and rehab RN have worked together to provide customized collaborative inpatient rehab. His blood pressures were monitored on TID basis and continue to fluctuate. He has also has issues with intolerance of multiple cardiac drugs.  Cardiology  was consulted for input on medication management and discontinued Entresto.  He did have upward trend in weight with acute renal failure.  Cardiology was reconsulted and recommended increasing spironolactone as well as metoprolol for better BP control.  Heart rate has been stable but blood pressure continues to range from 140-150 range and further titration to be done on follow-up with cardiology.  Follow-up CBC shows acute blood loss anemia and stable without signs of leukocytosis.  Check of electrolytes shows serum creatinine to be stable at around 1.3.  Abnormal LFTs are resolving.  Diabetes has been monitored with ac/hs CBG checks and  SSI was use prn for tighter BS control. Double portions and snacks added to help with hunger/provide adequate intake. Blood sugars continue to be variable as patient reports that he feels hypoglycemic if BS are below 140.  He was advised to use sliding scale insulin, keep a record of blood sugars and is to follow-up with PCP for further adjustment in insulin.  Pain has been well-controlled with rare use of oxycodone.  Insomnia has been managed with use of melatonin as well as trazodone as needed.  His left BKA site is healing well without any signs or symptoms of infection.  Prevena wound VAC was kept in place an additional week per Dr. Audrie Lia input.   He does continue to have moderate amount of edema left thigh as well as amputation site.  He has been educated on Ace wrapping for edema control as shrinker tends to slide down and causes tourniquet effect and popliteal fossa.  He has also been advised on elevating residual limb at all times as well as maintaining a low-salt diet.  Patient has made steady gains during his stay and is currently modified independent to supervision at wheelchair level.  He will continue to receive follow-up home health PT, OT and RN by [facility]  home health after discharge.   Rehab course: During patient's stay in rehab weekly team conferences were held to monitor patient's progress, set goals and discuss barriers to discharge. At admission, patient required mod assist with mobility and set up to mod assist with ADL tasks.  He  has had improvement in activity tolerance, balance, postural control as well as ability to compensate for deficits.  He is able to complete ADL tasks with supervision.  He is able to perform lateal scoot transfers at modified independent level.  He requires min assist for sit to stand transfers. His wife has been spending the nights and been present during most of his hospitalization. Patient and wife have been educated regarding all aspects of mobility and care.     Disposition: Home  Diet: Heart Healthy/Carb Modified.   Special Instructions: Keep BKA elevated at all times for edema control. Use compression wrap or shrinker.  Monitor blood sugars 3-4 times a day before meals and follow up with PCP for further adjustment in insulin.  3.  Appointment with Dr. Shari Prows May 15th at 10 am.  4.  Appointment with Bertram Denver, NP- May 14th at 2:30 to set up for primary care.  5, recommend repeat c-Met in 1 to 2 weeks to monitor renal status as well as LFTs.  Discharge Instructions     Ambulatory referral to Physical Medicine Rehab   Complete by: As directed       Allergies as of 09/09/2022       Reactions   Fish Allergy Anaphylaxis, Hives, Other (See Comments)   Benadryl [diphenhydramine] Swelling   Pt reports cannot  take pill form of benadryl   Coreg [carvedilol] Nausea Only   Vancomycin    Redman's-type rash on 08/13/22        Medication List     STOP taking these medications    bisacodyl 5 MG EC tablet Commonly known as: DULCOLAX   carvedilol 12.5 MG tablet Commonly known as: COREG   guaiFENesin-dextromethorphan 100-10 MG/5ML syrup Commonly known as: ROBITUSSIN DM   HumaLOG KwikPen 100 UNIT/ML KwikPen Generic drug: insulin lispro   ibuprofen 200 MG tablet Commonly known as: ADVIL   insulin aspart 100 UNIT/ML injection Commonly known as: novoLOG   insulin glargine-yfgn 100 UNIT/ML injection Commonly known as: SEMGLEE   polyethylene glycol 17 g packet Commonly known as: MIRALAX / GLYCOLAX Replaced by: polyethylene glycol powder 17 GM/SCOOP powder   sacubitril-valsartan 49-51 MG Commonly known as: ENTRESTO       TAKE these medications    acetaminophen 325 MG tablet Commonly known as: TYLENOL Take 2 tablets (650 mg total) by mouth 3 (three) times daily. What changed:  medication strength how much to take   ascorbic acid 1000 MG tablet Commonly known as: VITAMIN C Take 1 tablet (1,000 mg total) by mouth  daily.   furosemide 40 MG tablet Commonly known as: LASIX Take 1 tablet (40 mg total) by mouth daily.   gabapentin 400 MG capsule Commonly known as: NEURONTIN Take 1 capsule (400 mg total) by mouth 3 (three) times daily.   hydrocerin Crea Apply 1 Application topically 2 (two) times daily.   Insulin Pen Needle 32G X 4 MM Misc Use needles with insulin pen daily at 6 (six) AM.   melatonin 5 MG Tabs Take 1 tablet (5 mg total) by mouth at bedtime.   methocarbamol 750 MG tablet Commonly known as: ROBAXIN Take 1 tablet (750 mg total) by mouth every 6 (six) hours as needed for muscle spasms.   metoprolol tartrate 100 MG tablet Commonly known as: LOPRESSOR Take 1 tablet (100 mg total) by mouth 2 (two) times daily.   multivitamin with minerals Tabs tablet Take 1 tablet by mouth daily.   oxyCODONE 5 MG immediate release tablet--Rx# 7 pills Commonly known as: Oxy IR/ROXICODONE Take 0.5-1 tablets (2.5-5 mg total) by mouth daily as needed for severe pain. What changed:  how much to take when to take this reasons to take this   polyethylene glycol powder 17 GM/SCOOP powder Commonly known as: GLYCOLAX/MIRALAX Take 1 capful (17 g) by mouth daily. Replaces: polyethylene glycol 17 g packet   Semglee (yfgn) 100 UNIT/ML Solostar Pen Generic drug: insulin glargine Inject 12 Units into the skin daily.   spironolactone 50 MG tablet Commonly known as: ALDACTONE Take 1 tablet (50 mg total) by mouth daily. What changed:  medication strength how much to take   traZODone 50 MG tablet Commonly known as: DESYREL Take 0.5-1 tablets (25-50 mg total) by mouth at bedtime as needed for sleep.   zinc oxide 20 % ointment Apply topically as needed for irritation.        Follow-up Information     Claiborne Rigg, NP Follow up on 09/03/2022.   Specialty: Nurse Practitioner Why: Appointment @ 2:30 pm be there at 2:15 pm Contact information: [address] [place] [address] [name]  [address] [phone]         Meriam Sprague, MD Follow up on 09/24/2022.   Specialties: Cardiology, Radiology Why: at 10am for your cardiology follow up appointment Contact information: [address]  [name] [address] [phone]         Nadara Mustard, MD Follow up.   Specialty: Orthopedic Surgery Why: Call in 1-2 days for post hospital follow up Contact information: [address] [name] [address] [phone]         Fanny Dance, MD Follow up.   Specialty: Physical Medicine and Rehabilitation Why: office will call you with follow up appointment Contact information: [address] [name] [address] [phone]                 Signed: Jacquelynn Cree 09/10/2022, 10:54 PM

## 2022-09-09 NOTE — Progress Notes (Addendum)
PROGRESS NOTE   Subjective/Complaints:  Sitting in WC this am.  Shrinker removed yesterday, was too tight on proximal portion.  Reports this area was uncomfortable when shrinker was on in place.  Reports pain is well controlled. Denies SOB.   ROS: + Phantom pain-improving, +leg swelling-fluctuating Denies fevers, chills, CP, SOB, abd pain, N/V/D/C, new/worsening paresthesias/weakness, or any other complaints at this time.      Objective:   No results found. Recent Labs    09/08/22 0607  WBC 6.4  HGB 9.4*  HCT 31.3*  PLT 310      Recent Labs    09/08/22 0607  NA 136  K 4.1  CL 107  CO2 24  GLUCOSE 158*  BUN 19  CREATININE 1.29*  CALCIUM 8.1*      Intake/Output Summary (Last 24 hours) at 09/09/2022 0825 Last data filed at 09/09/2022 0737 Gross per 24 hour  Intake 1070 ml  Output --  Net 1070 ml         Physical Exam: Vital Signs Blood pressure (!) 170/92, pulse 79, temperature 97.6 F (36.4 C), temperature source Oral, resp. rate 16, height 6\' 4"  (1.93 m), weight (!) 157.6 kg, SpO2 99 %.  Constitutional: No distress . Vital signs reviewed.  Working with therapy in the gym HEENT: NCAT, EOMI, oral membranes moist Neck: supple Cardiovascular: RRR without murmur. No JVD    Respiratory/Chest: CTA Bilaterally without wheezes or rales. Normal effort    GI/Abdomen: BS +, non-tender, non-distended Ext: no clubbing or cyanosis. +BLE edema (chronic lymphedema in RLE about 1+, 1+ LLE edema at knee/stump) Psych: pleasant and cooperative  Skin: dry, warm. Chronic changes distal RLE d/t lymphedema, Some small scabs inferior to incision, no signs of infection. No signs of breakdown noted on popliteal area- zinc oxide in place.  Knee covered with ACE bandage. Neurologic: Alert and oriented x 3. Normal insight and awareness. Intact Memory.  Follows commands, cranial nerves II through XII grossly intact No focal  motor deficits noted MSK:  no abnormal tone noted       Assessment/Plan: 1. Functional deficits which require 3+ hours per day of interdisciplinary therapy in a comprehensive inpatient rehab setting. Physiatrist is providing close team supervision and 24 hour management of active medical problems listed below. Physiatrist and rehab team continue to assess barriers to discharge/monitor patient progress toward functional and medical goals  Care Tool:  Bathing    Body parts bathed by patient: Right arm, Left arm, Chest, Abdomen, Front perineal area, Face, Right upper leg, Left upper leg   Body parts bathed by helper: Right lower leg, Left lower leg, Buttocks Body parts n/a: Left lower leg   Bathing assist Assist Level: Minimal Assistance - Patient > 75%     Upper Body Dressing/Undressing Upper body dressing   What is the patient wearing?: Pull over shirt    Upper body assist Assist Level: Set up assist    Lower Body Dressing/Undressing Lower body dressing      What is the patient wearing?: Pants     Lower body assist Assist for lower body dressing: Minimal Assistance - Patient > 75%     Financial trader  Activity did not occur (Clothing management and hygiene only): N/A (no void or bm)  Toileting assist Assist for toileting: Minimal Assistance - Patient > 75%     Transfers Chair/bed transfer  Transfers assist     Chair/bed transfer assist level: Supervision/Verbal cueing     Locomotion Ambulation   Ambulation assist   Ambulation activity did not occur: Safety/medical concerns          Walk 10 feet activity   Assist  Walk 10 feet activity did not occur: Safety/medical concerns        Walk 50 feet activity   Assist Walk 50 feet with 2 turns activity did not occur: Safety/medical concerns         Walk 150 feet activity   Assist Walk 150 feet activity did not occur: Safety/medical concerns         Walk 10 feet on  uneven surface  activity   Assist Walk 10 feet on uneven surfaces activity did not occur: Safety/medical concerns         Wheelchair     Assist Is the patient using a wheelchair?: Yes Type of Wheelchair: Manual    Wheelchair assist level: Supervision/Verbal cueing Max wheelchair distance: 130    Wheelchair 50 feet with 2 turns activity    Assist        Assist Level: Supervision/Verbal cueing   Wheelchair 150 feet activity     Assist      Assist Level: Supervision/Verbal cueing   Blood pressure (!) 170/92, pulse 79, temperature 97.6 F (36.4 C), temperature source Oral, resp. rate 16, height 6\' 4"  (1.93 m), weight (!) 157.6 kg, SpO2 99 %.  Medical Problem List and Plan: 1. 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             -patient may shower             -ELOS/Goals: 7-10 days, supervision to min assist, +/- at w/c level  -Continue CIR  -Expected discharge 09/10/22  -Per social work possibly limited outpatient therapy sessions  -Shrinker ordered-will order zinc oxide to the area around the top of the shrinker, avoid surgical incision  -Will try AK size shrinker with belt loop, current shrinker appeared to tight around knee especially with swelling due to LE edema, therapy to try this in the afternoon   Due to current limitations in function related to LAKA, cardiomyopathy, obesity pt would benefit from hospital bed at home   2.  Antithrombotics: -DVT/anticoagulation:  Pharmaceutical: Lovenox 80mg  QD             -antiplatelet therapy: N/A 3. Pain Management: Tylenol 650mg  TID, Oxycodone 5-10mg  q4h prn. Tramadol 50mg  q6h PRN.              -add robaxin 500mg  q6h prn for spasms.  -08/23/22 having phantom pain interfering with sleep, will add gabapentin 100mg  TID for now, monitor for effect/side effects.  4/15 reports pain controlled -4/17 increase gabapentin to  200mg  TID, Increase robaxin dose to  750mg  PRN -4/19 reports pain continues to improve, continue current regimen for now -08/30/22 gabapentin dosing never increased, will inc to 200mg  TID now since  -4/23 increased gabapentin dose to 300mg  TID -4/25 Increase gabapentin to 400mg  TID -4/29 he reports pain is controlled today, continue current medications  4. Mood/Behavior/Sleep: LCSW to follow for evaluation and support.              -antipsychotic agents: N/A -  08/23/22 poor sleep, ordered melatonin 5mg  QHS, also has trazodone PRN--improved 08/24/22 -4/24--sleep is fair to inconsistent -09/06/22 pt being awoken for VS, changed VS order to QShift to lessen early AM wake ups.  -09/07/22 sleep greatly improved, hopefully continues  5. Neuropsych/cognition: This patient is capable of making decisions on his own behalf. 6. Skin/Wound Care: Monitor wound for healing             --limb protector when up -he's post-op day #7. Spoke with Dr Lajoyce Corners who recommended changing vac to prevena and continuing for another week.  -consider DC vac 4/21 -08/31/22 Prevena machine deactivated, d/c'd, asked nursing to apply kerlex/abd pad/ace wrap dressing, defer to primary team for further wound care orders. Also asked nursing to get TED hose for RLE -4/22 wound VAC has been removed  7. Fluids/Electrolytes/Nutrition: Monitor I/O. Continue Vitamin C and Zinc and MVI. Monitor routine Monday labs.              --add juven BID additionally -08/23/22 BMP with mild hyponatremia 134, otherwise fairly unremarkable, monitor on routine Monday labs -4/15 Na up to 137, improved -4/25 Na stable at 139, monitor routine labs  8. T2DM: Hgb A1c- 10.3 and poorly controlled. --dietician has added double portion as well as snacks TID to help with hunger/apptite             --monitor BS ac/hs and titrate insulin as indicated             --continue Insulin glargine 14 units QD and SSI  -4/16 fair control, continue current regimen for now  -4/17 monitor for now, consider  increase glargine  -4/26 patient reports hypoglycemic symptoms when glucose was 101 this morning, will decrease glargine to 12 units -4/30 fair control, had elevated CBG last night,  will monitor and continue current regimen to avoid symptoms of hypoglycemia, may need gradual decrease to tolerate ideal blood glucose levels CBG (last 3)  Recent Labs    09/08/22 1639 09/08/22 2108 09/09/22 0607  GLUCAP 234* 188* 153*     9. Dilated CM of unclear etiology: On Lasix 40mg  QD, Entresto 49-51mg  BID (dose increased today>>d/c'd 4/16) and spironolactone 25mg  QD             --will add low salt restrictions. Monitor daily weights --add low dose KCL due to recurrent hypokalemia requiring intermittent supplementation --recheck BMET 04/13 and on 04/15 as on spiro -pt is having nausea and ?diarrhea with coreg increase. Will convert him to metoprolol 25mg  bid to start--->titrate as needed. -08/23/22 K+ 3.7; pt educated on med changes and importance of compliance. No weight done today, ordered daily weights.  -08/24/22 no wt again, asked nursing to please make sure daily wt done; slight increase in RLE swelling noted, but mild, monitor for now -4/16 Will contact cardiology regarding DC of entresto -4/18 Seen by cardiology appreciate assistance, increase lopressor to 50mg  BID -08/31/22 wt a little up, mild swelling in RLE, asked for TED hose for now -4/24 weights trending up.   Spironolactone was increased to 50mg  daily effective TODAY, recheck BMP thursday  -interestingly his RLE lymphedema looks better -4/25 Will decrease spironolactone back to 25mg  daily, hold tomorrow, will ask for wts to be checked standing if possible -4/25 will attempt to Keep potassium >4, give 1x dose today to help keep optimal since will hold spironolactone, advised leg elevation -4/26 will contact cardiology regarding recommendations, weight does appear increased although patient says they have been weighing him with heavy blankets.  Gabapentin could be possible cause of leg swelling also however will hold off on making changes to this currently. -09/06/22 pt resistant to med changes for now; cont current regimen, wt still uptrending a bit so monitor closely; swelling fairly stable from prior; monitor -09/07/22 Cardiology increased Spironolactone to 50mg  QD again, starting tomorrow 09/08/22 -4/29, cardiology continuing 50 mg dose of spironolactone -4/30 will need repeat echo 3 months per cardiology   Filed Weights   09/07/22 0509 09/08/22 0519 09/09/22 0608  Weight: (!) 159.1 kg (!) 157.2 kg (!) 157.6 kg    10. HTN: Monitor BP TID--now on Lasix, Entresto and Spironolactone.             -metoprolol as above  -08/23/22 BPs elevated but recent change to meds, monitor for now  -4/15 mildly elevated, monitor for now -4/16 Entresto DC, will consider additional medication if BP remains elevated, will discuss with cardiology  -4/17 Cardiology notified, will contract cardiology again for possible different medication, cardiology plans to discuss with   -4/18 increase lopressor dose to 50mg  twice daily -4/19 blood pressures appear to be improving, continue monitor response -08/30/22  BPs still elevated but gradually improving, monitor trend -4/22  increase lopressor to 100mg  BID, heart rate can likely tolerate this -4/26 patient is declining Lopressor, encouraged use of medications, will also contact cardiology regarding possible medication adjustments -09/06/22 BPs so variable but hard to manage with pt refusing meds; resistant to changing meds; monitor but suspect it will be difficult to control with pt's noncompliance/resistance -09/07/22 Cardiology increased Spironolactone to 50mg  QD again, starting tomorrow 09/08/22 4/30 cardiology recommended trying amlodipine, note indicated pt said he didn't tolerate it in past  Vitals:   09/05/22 0712 09/05/22 1304 09/05/22 1949 09/06/22 0544  BP: (!) 161/96 (!) 158/85 (!) 170/88 (!) 155/87    09/06/22 1350 09/06/22 1948 09/07/22 0509 09/07/22 1353  BP: (!) 153/82 138/68 (!) 153/86 (!) 172/88   09/07/22 1920 09/08/22 0519 09/08/22 1421 09/09/22 0608  BP: (!) 157/85 (!) 159/84 (!) 159/87 (!) 170/92      11. Morbid obesity: Continue to educate on diet, exercise and compliance to help promote overall health and mobility.    12. Anemia:   -08/23/22 HGb 9.2, relatively stable; monitor on routine labs  -4/22 Hgb stable at 9.0, monitor on routine labs  13. Constipation: usually goes daily at home  -08/24/22 no BM in 2 days, will start miralax 17g QD for now; monitor  -08/30/22 LBM 2 days ago but doesn't want to change meds; monitor   -4/30 LBM yesterday   14.  Chest pain and nausea, improving  -Check EKG-NSR, Toponin- negative x3 -Pt reports this is related to his use of entresto, will DC this medication, He says he is feeling better now  -4/19 reports this has resolved since stopping Entresto  -Denies further chest pain  15. Azotemia -4/25 Decrease spironolactone back down to 25mg , hold dose tomorrow  -4/26 Cr still up at 1.32, spironolactone held today, will contact cardiology for recommendations -09/06/22 per cardiology, would be recommended to optimize GDMT but pt resistant; can f/up outpatient; monitor routine labs while here -4/29 creatinine still little elevated at 1.29, diuretics per cardiology   LOS: 18 days A FACE TO FACE EVALUATION WAS PERFORMED  Fanny Dance 09/09/2022, 8:25 AM

## 2022-09-09 NOTE — Progress Notes (Signed)
Patient ID: Alan Tapia., male   DOB: January 09, 1976, 47 y.o.   MRN: [id number]  [facility] referral sent to [facility] for [facility].

## 2022-09-09 NOTE — Progress Notes (Signed)
Patient ID: Alan Tapia., male   DOB: 07/16/1975, 47 y.o.   MRN: [id number]  Patient MATCH entered  ID: [id number] Effective: 4/30 Term: 5/7

## 2022-09-09 NOTE — Progress Notes (Addendum)
Physical Therapy Discharge Summary  Patient Details  Name: Alan Tapia. MRN: [id number] Date of Birth: July 13, 1975  Date of Discharge from PT service:September 09, 2022  Today's Date: 09/09/2022 PT Individual Time: 1030-1130, 1400-1455 PT Individual Time Calculation (min): 60 min, 55 min   Patient has met 7 of 9 long term goals due to improved activity tolerance, improved balance, increased strength, increased range of motion, decreased pain, ability to compensate for deficits, improved awareness, and improved coordination.  Patient to discharge at a wheelchair level Min Assist for sit<>stand.   Patient's care partner is independent to provide the necessary physical assistance at discharge.  Reasons goals not met: car transfer goal not met as pt requires min A to stand from [facility] level to get into high truck, however adequate for discharge as pt wife demos proficiency in assisting pt to perform stand pivot transfer WC <> truck with min A. Ambulation transfer not met, as pt limited by fatigue/pain/weakness, and demos poor R LE clearance with hopping technique, however adequate for discharge as pt house is newly renovated to be wheelchair accessible on ground floor per pt and his wife.   Recommendation:  Patient will benefit from ongoing skilled PT services in outpatient setting to continue to advance safe functional mobility, address ongoing impairments in pain, endurance, gait training, strength, balance, and minimize fall risk.  Equipment: WC, hospital bed, RW   Reasons for discharge: treatment goals met and discharge from hospital  Patient/family agrees with progress made and goals achieved: Yes  PT Discharge Precautions/Restrictions Precautions Precautions: Fall;Other (comment) Precaution Comments: L BKA Required Braces or Orthoses: Other Brace Other Brace: L limb guard Restrictions Weight Bearing Restrictions: Yes LLE Weight Bearing: Non weight bearing Pain Interference Pain  Interference Pain Effect on Sleep: 4. Almost constantly Pain Interference with Therapy Activities: 1. Rarely or not at all Pain Interference with Day-to-Day Activities: 1. Rarely or not at all Vision/Perception  Vision - History Ability to See in Adequate Light: 0 Adequate Perception Perception: Within Functional Limits Praxis Praxis: Intact  Cognition Overall Cognitive Status: Within Functional Limits for tasks assessed Arousal/Alertness: Awake/alert Orientation Level: Oriented X4 Memory: Appears intact Awareness: Appears intact Problem Solving: Appears intact Safety/Judgment: Appears intact Sensation Sensation Light Touch: Appears Intact Hot/Cold: Not tested Proprioception: Appears Intact Stereognosis: Not tested Additional Comments: Pt reports he isn't having as much phantom pain as pt is habitually doing densisitazition techniques in evening, pt reports in toes only now when it does occur. Pt able to sense light touch in all dermatomal patterns on R, and L but not as strongly on distal residual limb and posterior residual limb. Coordination Gross Motor Movements are Fluid and Coordinated: No Fine Motor Movements are Fluid and Coordinated: Yes Coordination and Movement Description: Deficits secondary to L BKA and bariatric status. Motor  Motor Motor: Within Functional Limits Motor - Skilled Clinical Observations: L BKA  Mobility Bed Mobility Bed Mobility: Rolling Right;Sit to Supine;Supine to Sit Rolling Right: Independent Supine to Sit: Independent Sit to Supine: Independent Transfers Transfers: Lateral/Scoot Transfers;Sit to Stand;Stand to Sit Sit to Stand: Supervision/Verbal cueing (from elevated surface such as elevated bed or truck, min A for WC level) Stand to Sit: Supervision/Verbal cueing Lateral/Scoot Transfers: Independent with assistive device Transfer (Assistive device): Rolling walker Locomotion  Gait Ambulation: No Stairs / Additional  Locomotion Stairs: No Corporate treasurer: Yes Wheelchair Assistance: Independent with assistive device Wheelchair Propulsion: Both upper extremities;Right lower extremity Wheelchair Parts Management: Independent Distance: 150+  Trunk/Postural Assessment  Cervical  Assessment Cervical Assessment: Within Functional Limits Thoracic Assessment Thoracic Assessment: Within Functional Limits Lumbar Assessment Lumbar Assessment: Within Functional Limits Postural Control Postural Control: Within Functional Limits  Balance Balance Balance Assessed: Yes Static Sitting Balance Static Sitting - Balance Support: Feet supported Static Sitting - Level of Assistance: 7: Independent Dynamic Sitting Balance Dynamic Sitting - Balance Support: Feet supported Dynamic Sitting - Level of Assistance: 7: Independent Static Standing Balance Static Standing - Balance Support: Bilateral upper extremity supported;During functional activity Static Standing - Level of Assistance: 5: Stand by assistance (supervision) Dynamic Standing Balance Dynamic Standing - Balance Support: During functional activity;Bilateral upper extremity supported Dynamic Standing - Level of Assistance: 5: Stand by assistance (superivsion-stand pivot) Extremity Assessment  RUE Assessment RUE Assessment: Within Functional Limits LUE Assessment LUE Assessment: Within Functional Limits RLE Assessment RLE Assessment: Exceptions to [facility] General Strength Comments: grossly 4+/5 with exception of hip flexion 3/5 LLE Assessment General Strength Comments: hip and knee grossly 4+/5   Today's Interventions  Treatment Session 1    Pt seated in [facility] upon arrival with wife in room. Pt agreeable to therapy. Pt denies any pain.  Therapist reviewed discharge components. Pt performed lateral scoot transfer from old WC to new WC with supervision. Therapist confirmed that cushion would be arriving today. Therapist ensured  adequate fit of WC/leg rests. Pt ensured adequate fit for new RW. Pt reviewed HEP below. Pt performed seated exercises, education provided on how to progress exercises with TB to increase difficulty. Therapist demonstrated supine exercises and standing exercises, with emphasis on how to perform to reduce compensation and target muscles. Pt verbalized understanding. Education provided to perform bed exercises in morning, standing exercises in afternoon, and seated exercises in evening. Education provided to perform standing exercises in front of bed while holding onto RW. Education provided to perform sit<>stand from elevated bed with emphasis on keeping R LE under patient versus kicking it out for momentum, and using forward trunk lean to assist with momentum. Pt verbalized understanding.     Access Code: [email or web address] URL: [email or web address] Date: 09/09/2022 Prepared by: Ambrose Finland  Exercises - Supine Bridge  - 1 x daily - 7 x weekly - 3 sets - 10 reps - Clamshell  - 1 x daily - 7 x weekly - 3 sets - 10 reps - Supine Hip Abduction  - 1 x daily - 7 x weekly - 3 sets - 10 reps - Supine Active Straight Leg Raise  - 1 x daily - 7 x weekly - 3 sets - 10 reps - Sidelying Hip Abduction  - 1 x daily - 7 x weekly - 3 sets - 10 reps - Seated Long Arc Quad  - 1 x daily - 7 x weekly - 3 sets - 10 reps - Supine Heel Slide  - 1 x daily - 7 x weekly - 3 sets - 10 reps - Standing Hip Abduction with Counter Support  - 1 x daily - 7 x weekly - 3 sets - 10 reps - Standing March with Counter Support  - 1 x daily - 7 x weekly - 3 sets - 10 reps - Heel Raises with Counter Support  - 1 x daily - 7 x weekly - 3 sets - 10 reps - Seated March  - 1 x daily - 7 x weekly - 3 sets - 10 reps - Prone Hip Extension  - 1 x daily - 7 x weekly - 3 sets - 10 reps - Sit to Stand  - 1  x daily - 7 x weekly - 3 sets - 10 reps -Wheelchair pushups   HEP handout provided, pt denies any questions/concerns with HEP and  going home.   Treatment Session 2   Pt seated in [facility] with wife in room. Pt agreeable to therapy. Pt denies any pain. Pt WC cushion has been delivered and pt reports he and wife did lateral scoot transfer to place in [facility]. Therapist verified proper fit.  Pt self propelled WC to ortho gym with mod I.   Pt asking if he can perform sit<>stand at kitchen sink with WC behind pt as depicted in picture with HEP. Therapist and pt performed at sink in ortho gym with mod A, however not safe to perform at home. Education provided to perform sit<>stand from elevated at home with technique previously discussed to strengthen targeted muscles and to lower bed as it becomes easy, pt verbalized understanding and agrees. Education provided to perform mini stands (without full knee extension) at kitchen sink with R UE on arm rest and L UE on sink, as it forces pt to utilize triceps, quads and glutes, and minimizes compensation; but not safe to perform full standing with full knee extension. Pt agrees and verbalizes understanding.   Wife informed therapist there has been a temporary ramp installed at their home. Therapist provided education for ambulating up ramp backwards, and down ramp forwards. Pt ascended ramp backwards with B UE and CGA, and descended with B UE and supervision. Pt trialed ascending forwards but required mod A. Pt then ascended backwards, and descended forwards with B UE and R LE with mod I.   New shrinker has not arrived prior to end of session. Therapist reached out to [employer] from [employer] and confirmed he would deliver it today and teach pt how to donn/doff. Therapist informed pt of conversation with Scoot.   Pt seated in WC at end of session with all needs within reach and wife in room.     [facility], [place], DPT  09/09/2022, 4:28 PM

## 2022-09-09 NOTE — Progress Notes (Signed)
Occupational Therapy Discharge Summary  Patient Details  Name: Orlandus Borowski. MRN: [id number] Date of Birth: [birth year]  Date of Discharge from OT service:September 09, 2022   Patient has met 5 of 6 long term goals due to improved activity tolerance, improved balance, postural control, ability to compensate for deficits, and improved coordination.  Patient to discharge at overall Supervision level.  Patient's care partner is independent to provide the necessary physical assistance at discharge. Family education has been completed with pt and his wife [name]. Despite some ongoing concerns with home accessibility, pt and his wife feel comfortable with proceeding with discharge and are confident they can safely complete transfers at home.    Reasons goals not met: Patient requires min A- CGA for standing d/t need for RW stabilization. His wife is able to provide this support.   Recommendation:  Patient will benefit from ongoing skilled OT services in home health setting to continue to advance functional skills in the area of BADL and iADL.  Equipment: Bariatric drop arm BSC, RW, w/c   Reasons for discharge: treatment goals met and discharge from hospital  Patient/family agrees with progress made and goals achieved: Yes  OT Discharge Precautions/Restrictions  Precautions Precautions: Fall;Other (comment) Precaution Comments: L BKA Other Brace: L limb guard Restrictions Weight Bearing Restrictions: Yes LLE Weight Bearing: Non weight bearing  Vital Signs Therapy Vitals Temp: 97.9 F (36.6 C) Pulse Rate: 81 Resp: 18 BP: (!) 153/86 Patient Position (if appropriate): Sitting Oxygen Therapy SpO2: 98 % O2 Device: Room Air Pain Pain Assessment Pain Scale: 0-10 Pain Score: 2  Faces Pain Scale: No hurt Pain Type: Acute pain ADL ADL Eating: Independent Where Assessed-Eating: Edge of bed Grooming: Independent Where Assessed-Grooming: Sitting at sink Upper Body Bathing:  Modified independent Where Assessed-Upper Body Bathing: Sitting at sink, Wheelchair Lower Body Bathing: Modified independent Where Assessed-Lower Body Bathing: Wheelchair, Edge of bed Upper Body Dressing: Modified independent (Device) Where Assessed-Upper Body Dressing: Wheelchair Lower Body Dressing: Modified independent Where Assessed-Lower Body Dressing: Edge of bed, Bed level Toileting: Modified independent Where Assessed-Toileting: Bedside Commode Toilet Transfer: Modified independent Toilet Transfer Method: Ambulance person: Extra wide drop arm bedside commode Film/video editor: Unable to assess Vision Baseline Vision/History: 1 Wears glasses Patient Visual Report: No change from baseline Vision Assessment?: No apparent visual deficits Perception  Perception: Within Functional Limits Praxis Praxis: Intact Cognition Cognition Overall Cognitive Status: Within Functional Limits for tasks assessed Arousal/Alertness: Awake/alert Memory: Appears intact Awareness: Appears intact Problem Solving: Appears intact Safety/Judgment: Appears intact Brief Interview for Mental Status (BIMS) Repetition of Three Words (First Attempt): 3 Temporal Orientation: Year: Correct Temporal Orientation: Month: Accurate within 5 days Temporal Orientation: Day: Correct Recall: "Sock": No, could not recall Recall: "Blue": Yes, no cue required Recall: "Bed": Yes, no cue required BIMS Summary Score: 13 Sensation Sensation Light Touch: Appears Intact Proprioception: Appears Intact Additional Comments: Pt reports he isn't having as much phantom pain as pt is habitually doing densisitazition techniques in evening, pt reports in toes only now when it does occur. Pt able to sense light touch in all dermatomal patterns on R, and L but not as strongly on distal residual limb and posterior residual limb. Coordination Gross Motor Movements are Fluid and Coordinated: No Fine Motor  Movements are Fluid and Coordinated: Yes Coordination and Movement Description: Deficits secondary to L BKA and bariatric status. Motor  Motor Motor: Within Functional Limits Mobility  Bed Mobility Bed Mobility: Rolling Right;Sit to Supine;Supine to Sit Rolling Right:  Independent Supine to Sit: Independent Sit to Supine: Independent Transfers Sit to Stand: Minimal Assistance - Patient > 75% Stand to Sit: Minimal Assistance - Patient > 75%  Trunk/Postural Assessment  Cervical Assessment Cervical Assessment: Within Functional Limits Thoracic Assessment Thoracic Assessment: Within Functional Limits Lumbar Assessment Lumbar Assessment: Within Functional Limits Postural Control Postural Control: Within Functional Limits  Balance Balance Balance Assessed: Yes Static Sitting Balance Static Sitting - Balance Support: Feet supported Static Sitting - Level of Assistance: 7: Independent Dynamic Sitting Balance Dynamic Sitting - Balance Support: Feet supported Dynamic Sitting - Level of Assistance: 7: Independent Static Standing Balance Static Standing - Balance Support: Bilateral upper extremity supported;During functional activity Static Standing - Level of Assistance: 5: Stand by assistance Dynamic Standing Balance Dynamic Standing - Balance Support: During functional activity;Bilateral upper extremity supported Dynamic Standing - Level of Assistance: 4: Min assist Extremity/Trunk Assessment RUE Assessment RUE Assessment: Within Functional Limits LUE Assessment LUE Assessment: Within Functional Limits   Crissie Reese 09/09/2022, 2:43 PM

## 2022-09-09 NOTE — Progress Notes (Signed)
Inpatient Rehabilitation Discharge Medication Review by a Pharmacist  A complete drug regimen review was completed for this patient to identify any potential clinically significant medication issues.  High Risk Drug Classes Is patient taking? Indication by Medication  Antipsychotic No   Anticoagulant No   Antibiotic No   Opioid Yes Oxycodone - prn pain  Antiplatelet No   Hypoglycemics/insulin Yes Insulin - DM  Vasoactive Medication Yes Furosemide, spironolactone-fluid Metoprolol - HTN  Chemotherapy No   Other Yes Gabapentin - pain Methocarbamol - prn spasms Trazodone - prn sleep     Type of Medication Issue Identified Description of Issue Recommendation(s)  Drug Interaction(s) (clinically significant)     Duplicate Therapy     Allergy     No Medication Administration End Date     Incorrect Dose     Additional Drug Therapy Needed     Significant med changes from prior encounter (inform family/care partners about these prior to discharge).    Other       Clinically significant medication issues were identified that warrant physician communication and completion of prescribed/recommended actions by midnight of the next day:  No  Name of provider notified for urgent issues identified:   Provider Method of Notification:   Pharmacist comments: None  Time spent performing this drug regimen review (minutes):  20 minutes  Thank you Okey Regal, PharmD

## 2022-09-09 NOTE — Progress Notes (Signed)
Inpatient Rehabilitation Care Coordinator Discharge Note   Patient Details  Name: Alan Tapia. MRN: [id number] Date of Birth: February 09, 1976   Discharge location: Home with spouse  Length of Stay: 19 Days  Discharge activity level: Cga/Min A  Home/community participation: Spouse  Patient response [id number] Literacy - How often do you need to have someone help you when you read instructions, pamphlets, or other written material from your doctor or pharmacy?: Often  Patient response [id number] Isolation - How often do you feel lonely or isolated from those around you?: Never  Services provided included: RD, MD, PT, OT, SLP, RN, CM, TR, Pharmacy, Neuropsych, SW  Financial Services:  Field seismologist Utilized: Other (Comment) (uninsured.)    Choices offered to/list presented to: patient and spouse  Follow-up services arranged:  DME      DME : HB, WC, RW and BSC    Patient response to transportation need: Is the patient able to respond to transportation needs?: Yes In the past 12 months, has lack of transportation kept you from medical appointments or from getting medications?: No In the past 12 months, has lack of transportation kept you from meetings, work, or from getting things needed for daily living?: No   Patient/Family verbalized understanding of follow-up arrangements:  Yes  Individual responsible for coordination of the follow-up plan: Spouse  Confirmed correct DME delivered: Andria Rhein 09/09/2022    Comments (or additional information):  Summary of Stay    Date/Time Discharge Planning CSW  09/09/22 1502 Discharging home with spouse tomorrow. Patient does not have active medicaid, Pt uninsured . CJB  09/02/22 1204 Dsicharging home with spouse. Patient [place] active 5/1. Anticipating [facility] and DME once established. CJB  08/27/22 0838 Home with wife who has been staying here to assist and provide emotional support. Many  issues-diabetes, insurance, accessibility to home. Working on all of these. Letter given for making home accessible and for wife being out of work. On list for neuro-psych to see may also benefit peer support RGD       Andria Rhein

## 2022-09-09 NOTE — Progress Notes (Signed)
Rounding Note    Patient Name: Alan Tapia. Date of Encounter: 09/09/2022  [facility] Cardiologist: Meriam Sprague, MD   Subjective   No CP, no SOB, in wheelchair. Amputated leg elevated. No SOB.  He is hesitant to try amlodipine again.  Inpatient Medications    Scheduled Meds:  acetaminophen  650 mg Oral TID   vitamin C  1,000 mg Oral Daily   enoxaparin (LOVENOX) injection  80 mg Subcutaneous Q24H   furosemide  40 mg Oral Daily   gabapentin  400 mg Oral TID   hydrocerin   Topical BID   insulin aspart  0-5 Units Subcutaneous QHS   insulin aspart  0-9 Units Subcutaneous TID WC   insulin glargine-yfgn  12 Units Subcutaneous Daily   melatonin  5 mg Oral QHS   metoprolol tartrate  100 mg Oral BID   multivitamin with minerals  1 tablet Oral Daily   nutrition supplement (JUVEN)  1 packet Oral BID BM   polyethylene glycol  17 g Oral Daily   spironolactone  50 mg Oral Daily   Continuous Infusions:  PRN Meds: acetaminophen, alum & mag hydroxide-simeth, bisacodyl, guaiFENesin-dextromethorphan, hydrocortisone cream, methocarbamol, oxyCODONE, phenol, polyethylene glycol, prochlorperazine **OR** prochlorperazine **OR** prochlorperazine, sodium phosphate, traMADol, traZODone, zinc oxide   Vital Signs    Vitals:   09/07/22 1920 09/08/22 0519 09/08/22 1421 09/09/22 0608  BP: (!) 157/85 (!) 159/84 (!) 159/87 (!) 170/92  Pulse: 84 74 86 79  Resp: 18 16 17 16   Temp: 98.1 F (36.7 C) 97.8 F (36.6 C) 98.3 F (36.8 C) 97.6 F (36.4 C)  TempSrc: Oral Oral  Oral  SpO2: 100% 98% 98% 99%  Weight:  (!) 157.2 kg  (!) 157.6 kg  Height:        Intake/Output Summary (Last 24 hours) at 09/09/2022 0910 Last data filed at 09/09/2022 0737 Gross per 24 hour  Intake 1070 ml  Output --  Net 1070 ml      09/09/2022    6:08 AM 09/08/2022    5:19 AM 09/07/2022    5:09 AM  Last 3 Weights  Weight (lbs) 347 lb 7.1 oz 346 lb 9 oz 350 lb 12 oz  Weight (kg) 157.6 kg 157.2  kg 159.1 kg      Physical Exam   GEN: No acute distress.   Neck: No JVD Cardiac: RRR, no murmurs, rubs, or gallops.  Respiratory: Clear to auscultation bilaterally. GI: Soft, nontender, non-distended  MS: No edema; LBKA Neuro:  Nonfocal  Psych: Normal affect, pleasant  Labs    High Sensitivity Troponin:   Recent Labs  Lab 08/26/22 0926 08/26/22 1142 08/26/22 1431  TROPONINIHS 4 4 5      Chemistry Recent Labs  Lab 09/04/22 0551 09/05/22 0621 09/08/22 0607  NA 139 137 136  K 4.0 4.0 4.1  CL 105 108 107  CO2 25 24 24   GLUCOSE 133* 116* 158*  BUN 17 19 19   CREATININE 1.28* 1.32* 1.29*  CALCIUM 8.3* 8.0* 8.1*  GFRNONAA >60 >60 >60  ANIONGAP 9 5 5     Lipids No results for input(s): "CHOL", "TRIG", "HDL", "LABVLDL", "LDLCALC", "CHOLHDL" in the last 168 hours.  Hematology Recent Labs  Lab 09/08/22 0607  WBC 6.4  RBC 3.94*  HGB 9.4*  HCT 31.3*  MCV 79.4*  MCH 23.9*  MCHC 30.0  RDW 20.0*  PLT 310   Thyroid No results for input(s): "TSH", "FREET4" in the last 168 hours.  BNPNo results for  input(s): "BNP", "PROBNP" in the last 168 hours.  DDimer No results for input(s): "DDIMER" in the last 168 hours.   Radiology    No results found.  Cardiac Studies   Echo 08/17/22   1. Left ventricular ejection fraction, by estimation, is 35 to 40%. The  left ventricle has moderately decreased function. The left ventricle  demonstrates global hypokinesis. The left ventricular internal cavity size  was mildly dilated. Indeterminate  diastolic filling due to E-A fusion.   2. Right ventricular systolic function is normal. The right ventricular  size is normal. Tricuspid regurgitation signal is inadequate for assessing  PA pressure.   3. Left atrial size was mildly dilated.   4. The mitral valve is grossly normal. Mild mitral valve regurgitation.   5. The aortic valve is tricuspid. Aortic valve regurgitation is not  visualized.   6. The inferior vena cava is dilated in  size with <50% respiratory  variability, suggesting right atrial pressure of 15 mmHg.   Patient Profile     47 y.o. male with a history of recently diagnosed HFrEF with EF of 35-40%, uncontrolled hypertension, uncontrolled type 2 diabetes mellitus, bilateral lower extremity lymphedema, chronic left foot ulcer, chronic anemia, morbid obesity, and medication non-compliance. He was admitted on 08/13/2022 for osteomyelitis and septic arthritis of left foot after presenting with increased swelling and drainage from chronic left foot ulcers. 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 Cardiology was initially consulted for evaluation of new systolic CHF with EF of 35-40%.  Assessment & Plan    Acute systolic HF EF 35-40%  -- not tolerated Entresto, Coreg, Lopressor, Jardiance (GI side effect). This leaves [place] with spironolactone.   -- Repeat ECHO in 3 months. If still reduced EF, plan ischemic work up. Likely etiology HTN.  Discussed with him.  HTN  -- spironolactone 50mg  now. K ok.  I desired to start amlodipine 10 mg earlier this morning however he is hesitant to try this medication.  He states that he may have had this on the floor previously and he did not react well to it. -Ultimately, I think further medication titration can be done as outpatient with his primary care physician/cardiologist.  This may take several weeks of trial and error given his intolerances.  Morbid obesity  -- continue to encourage weight loss.   LBKA  -- osteomyelitis.   Further trial of medications as outpatient.  We will go ahead and sign off.  He has follow-up appointment.  For questions or updates, please contact [facility] Please consult [email or web address] for contact info under        Signed, Donato Schultz, MD  09/09/2022, 9:10 AM

## 2022-09-10 ENCOUNTER — Other Ambulatory Visit (HOSPITAL_COMMUNITY): Payer: Self-pay

## 2022-09-10 ENCOUNTER — Ambulatory Visit: Payer: Medicaid - Out of State | Admitting: Cardiology

## 2022-09-10 DIAGNOSIS — E11621 Type 2 diabetes mellitus with foot ulcer: Secondary | ICD-10-CM

## 2022-09-10 DIAGNOSIS — L97509 Non-pressure chronic ulcer of other part of unspecified foot with unspecified severity: Secondary | ICD-10-CM

## 2022-09-10 LAB — GLUCOSE, CAPILLARY
Glucose-Capillary: 147 mg/dL — ABNORMAL HIGH (ref 70–99)
Glucose-Capillary: 150 mg/dL — ABNORMAL HIGH (ref 70–99)

## 2022-09-10 LAB — BASIC METABOLIC PANEL
Anion gap: 5 (ref 5–15)
BUN: 20 mg/dL (ref 6–20)
CO2: 24 mmol/L (ref 22–32)
Calcium: 8.2 mg/dL — ABNORMAL LOW (ref 8.9–10.3)
Chloride: 108 mmol/L (ref 98–111)
Creatinine, Ser: 1.28 mg/dL — ABNORMAL HIGH (ref 0.61–1.24)
GFR, Estimated: >60 mL/min (ref >60)
Glucose, Bld: 146 mg/dL — ABNORMAL HIGH (ref 70–99)
Potassium: 4.1 mmol/L (ref 3.5–5.1)
Sodium: 137 mmol/L (ref 135–145)

## 2022-09-10 NOTE — Progress Notes (Signed)
PROGRESS NOTE   Subjective/Complaints:  Sitting in WC. Looking forward to DC home today. Waiting on new shrinker but has been educated on Ace wrap. Denies pain.   ROS: + Phantom pain and residual limb pain-improved, +leg swelling-fluctuating Denies fevers, chills, CP, SOB, abd pain, N/V/D/C, new/worsening paresthesias/weakness, or any other complaints at this time.      Objective:   No results found. Recent Labs    09/08/22 0607  WBC 6.4  HGB 9.4*  HCT 31.3*  PLT 310      Recent Labs    09/08/22 0607 09/10/22 0621  NA 136 137  K 4.1 4.1  CL 107 108  CO2 24 24  GLUCOSE 158* 146*  BUN 19 20  CREATININE 1.29* 1.28*  CALCIUM 8.1* 8.2*      Intake/Output Summary (Last 24 hours) at 09/10/2022 1017 Last data filed at 09/10/2022 0951 Gross per 24 hour  Intake 952 ml  Output 2400 ml  Net -1448 ml         Physical Exam: Vital Signs Blood pressure (!) 145/83, pulse 73, temperature 98.5 F (36.9 C), temperature source Oral, resp. rate 16, height 6\' 4"  (1.93 m), weight (!) 156.7 kg, SpO2 99 %.  Constitutional: No distress . Vital signs reviewed.  Working with therapy in the gym HEENT: NCAT, EOMI, oral membranes moist Neck: supple Cardiovascular: RRR without murmur. No JVD    Respiratory/Chest: CTA Bilaterally without wheezes or rales. Normal effort    GI/Abdomen: BS +, non-tender, non-distended Ext: no clubbing or cyanosis. +BLE edema Psych: pleasant and cooperative  Skin: dry, warm. Chronic changes distal RLE d/t lymphedema, Please see imagines. Surgical incision no signs of infection noted.  Neurologic: Alert and oriented x 3. Normal insight and awareness. Intact Memory.  Follows commands, cranial nerves II through XII grossly intact No focal motor deficits noted MSK:  no abnormal tone noted, no joint redness or swelling          Assessment/Plan: 1. Functional deficits which require 3+ hours per  day of interdisciplinary therapy in a comprehensive inpatient rehab setting. Physiatrist is providing close team supervision and 24 hour management of active medical problems listed below. Physiatrist and rehab team continue to assess barriers to discharge/monitor patient progress toward functional and medical goals  Care Tool:  Bathing    Body parts bathed by patient: Right arm, Left arm, Chest, Abdomen, Front perineal area, Face, Right upper leg, Left upper leg   Body parts bathed by helper: Right lower leg, Left lower leg, Buttocks Body parts n/a: Left lower leg   Bathing assist Assist Level: Minimal Assistance - Patient > 75%     Upper Body Dressing/Undressing Upper body dressing   What is the patient wearing?: Pull over shirt    Upper body assist Assist Level: Set up assist    Lower Body Dressing/Undressing Lower body dressing      What is the patient wearing?: Pants     Lower body assist Assist for lower body dressing: Minimal Assistance - Patient > 75%     Toileting Toileting Toileting Activity did not occur (Clothing management and hygiene only): N/A (no void or bm)  Toileting assist Assist  for toileting: Minimal Assistance - Patient > 75%     Transfers Chair/bed transfer  Transfers assist     Chair/bed transfer assist level: Supervision/Verbal cueing     Locomotion Ambulation   Ambulation assist   Ambulation activity did not occur: Safety/medical concerns          Walk 10 feet activity   Assist  Walk 10 feet activity did not occur: Safety/medical concerns        Walk 50 feet activity   Assist Walk 50 feet with 2 turns activity did not occur: Safety/medical concerns         Walk 150 feet activity   Assist Walk 150 feet activity did not occur: Safety/medical concerns         Walk 10 feet on uneven surface  activity   Assist Walk 10 feet on uneven surfaces activity did not occur: Safety/medical concerns          Wheelchair     Assist Is the patient using a wheelchair?: Yes Type of Wheelchair: Manual    Wheelchair assist level: Supervision/Verbal cueing Max wheelchair distance: 130    Wheelchair 50 feet with 2 turns activity    Assist        Assist Level: Supervision/Verbal cueing   Wheelchair 150 feet activity     Assist      Assist Level: Supervision/Verbal cueing   Blood pressure (!) 145/83, pulse 73, temperature 98.5 F (36.9 C), temperature source Oral, resp. rate 16, height 6\' 4"  (1.93 m), weight (!) 156.7 kg, SpO2 99 %.  Medical Problem List and Plan: 1. 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             -patient may shower             -ELOS/Goals: 7-10 days, supervision to min assist, +/- at w/c level  -Continue CIR  -Expected discharge 09/10/22  -Per social work possibly limited outpatient therapy sessions  -Shrinker ordered-will order zinc oxide to the area around the top of the shrinker, avoid surgical incision  -Will try AK size shrinker with belt loop, current shrinker appeared to tight around knee especially with swelling due to LE edema, therapy to try this in the afternoon  -DC home today, he is to get new shrinker    Due to current limitations in function related to LAKA, cardiomyopathy, obesity pt would benefit from hospital bed at home   2.  Antithrombotics: -DVT/anticoagulation:  Pharmaceutical: Lovenox 80mg  QD             -antiplatelet therapy: N/A 3. Pain Management: Tylenol 650mg  TID, Oxycodone 5-10mg  q4h prn. Tramadol 50mg  q6h PRN.              -add robaxin 500mg  q6h prn for spasms.  -08/23/22 having phantom pain interfering with sleep, will add gabapentin 100mg  TID for now, monitor for effect/side effects.  4/15 reports pain controlled -4/17 increase gabapentin to  200mg  TID, Increase robaxin dose to 750mg  PRN -4/19 reports pain continues to improve, continue current regimen for  now -08/30/22 gabapentin dosing never increased, will inc to 200mg  TID now since  -4/23 increased gabapentin dose to 300mg  TID -4/25 Increase gabapentin to 400mg  TID -5/1 denies pain , continue current  4. Mood/Behavior/Sleep: LCSW to follow for evaluation and support.              -antipsychotic agents: N/A -08/23/22 poor sleep, ordered melatonin 5mg  QHS, also has trazodone PRN--improved  08/24/22 -4/24--sleep is fair to inconsistent -09/06/22 pt being awoken for VS, changed VS order to QShift to lessen early AM wake ups.  -09/07/22 sleep greatly improved, hopefully continues  5. Neuropsych/cognition: This patient is capable of making decisions on his own behalf. 6. Skin/Wound Care: Monitor wound for healing             --limb protector when up -he's post-op day #7. Spoke with Dr Lajoyce Corners who recommended changing vac to prevena and continuing for another week.  -consider DC vac 4/21 -08/31/22 Prevena machine deactivated, d/c'd, asked nursing to apply kerlex/abd pad/ace wrap dressing, defer to primary team for further wound care orders. Also asked nursing to get TED hose for RLE -4/22 wound VAC has been removed  7. Fluids/Electrolytes/Nutrition: Monitor I/O. Continue Vitamin C and Zinc and MVI. Monitor routine Monday labs.              --add juven BID additionally -08/23/22 BMP with mild hyponatremia 134, otherwise fairly unremarkable, monitor on routine Monday labs -4/15 Na up to 137, improved -4/25 Na stable at 139, monitor routine labs  8. T2DM: Hgb A1c- 10.3 and poorly controlled. --dietician has added double portion as well as snacks TID to help with hunger/apptite             --monitor BS ac/hs and titrate insulin as indicated             --continue Insulin glargine 14 units QD and SSI  -4/16 fair control, continue current regimen for now  -4/17 monitor for now, consider increase glargine  -4/26 patient reports hypoglycemic symptoms when glucose was 101 this morning, will decrease glargine  to 12 units -5/1 fair control, discuss improtance of CBG control, f/u with PCP for further monitoring  CBG (last 3)  Recent Labs    09/09/22 1644 09/09/22 2051 09/10/22 0612  GLUCAP 152* 208* 147*     9. Dilated CM of unclear etiology: On Lasix 40mg  QD, Entresto 49-51mg  BID (dose increased today>>d/c'd 4/16) and spironolactone 25mg  QD             --will add low salt restrictions. Monitor daily weights --add low dose KCL due to recurrent hypokalemia requiring intermittent supplementation --recheck BMET 04/13 and on 04/15 as on spiro -pt is having nausea and ?diarrhea with coreg increase. Will convert him to metoprolol 25mg  bid to start--->titrate as needed. -08/23/22 K+ 3.7; pt educated on med changes and importance of compliance. No weight done today, ordered daily weights.  -08/24/22 no wt again, asked nursing to please make sure daily wt done; slight increase in RLE swelling noted, but mild, monitor for now -4/16 Will contact cardiology regarding DC of entresto -4/18 Seen by cardiology appreciate assistance, increase lopressor to 50mg  BID -08/31/22 wt a little up, mild swelling in RLE, asked for TED hose for now -4/24 weights trending up.   Spironolactone was increased to 50mg  daily effective TODAY, recheck BMP thursday  -interestingly his RLE lymphedema looks better -4/25 Will decrease spironolactone back to 25mg  daily, hold tomorrow, will ask for wts to be checked standing if possible -4/25 will attempt to Keep potassium >4, give 1x dose today to help keep optimal since will hold spironolactone, advised leg elevation -4/26 will contact cardiology regarding recommendations, weight does appear increased although patient says they have been weighing him with heavy blankets.  Gabapentin could be possible cause of leg swelling also however will hold off on making changes to this currently. -09/06/22 pt resistant to med changes for  now; cont current regimen, wt still uptrending a bit so monitor  closely; swelling fairly stable from prior; monitor -09/07/22 Cardiology increased Spironolactone to 50mg  QD again, starting tomorrow 09/08/22 -4/29, cardiology continuing 50 mg dose of spironolactone -4/30 will need repeat echo 3 months per cardiology  -5/1 no medication changes per cardiology today, wt down a little   Filed Weights   09/08/22 0519 09/09/22 0608 09/10/22 0500  Weight: (!) 157.2 kg (!) 157.6 kg (!) 156.7 kg    10. HTN: Monitor BP TID--now on Lasix, Entresto and Spironolactone.             -metoprolol as above  -08/23/22 BPs elevated but recent change to meds, monitor for now  -4/15 mildly elevated, monitor for now -4/16 Entresto DC, will consider additional medication if BP remains elevated, will discuss with cardiology  -4/17 Cardiology notified, will contract cardiology again for possible different medication, cardiology plans to discuss with   -4/18 increase lopressor dose to 50mg  twice daily -4/19 blood pressures appear to be improving, continue monitor response -08/30/22  BPs still elevated but gradually improving, monitor trend -4/22  increase lopressor to 100mg  BID, heart rate can likely tolerate this -4/26 patient is declining Lopressor, encouraged use of medications, will also contact cardiology regarding possible medication adjustments -09/06/22 BPs so variable but hard to manage with pt refusing meds; resistant to changing meds; monitor but suspect it will be difficult to control with pt's noncompliance/resistance -09/07/22 Cardiology increased Spironolactone to 50mg  QD again, starting tomorrow 09/08/22 4/30 cardiology recommended trying amlodipine, note indicated pt said he didn't tolerate it in past  -BP fair today, pt to consider additional meds with cardiology and PCP outpatient Vitals:   09/06/22 0544 09/06/22 1350 09/06/22 1948 09/07/22 0509  BP: (!) 155/87 (!) 153/82 138/68 (!) 153/86   09/07/22 1353 09/07/22 1920 09/08/22 0519 09/08/22 1421  BP: (!) 172/88  (!) 157/85 (!) 159/84 (!) 159/87   09/09/22 0608 09/09/22 1359 09/09/22 1927 09/10/22 0436  BP: (!) 170/92 (!) 153/86 (!) 146/69 (!) 145/83      11. Morbid obesity: Continue to educate on diet, exercise and compliance to help promote overall health and mobility.    12. Anemia:   -08/23/22 HGb 9.2, relatively stable; monitor on routine labs  -4/22 Hgb stable at 9.0, monitor on routine labs  13. Constipation: usually goes daily at home  -08/24/22 no BM in 2 days, will start miralax 17g QD for now; monitor  -08/30/22 LBM 2 days ago but doesn't want to change meds; monitor   -4/30 LBM yesterday   14.  Chest pain and nausea, improving  -Check EKG-NSR, Toponin- negative x3 -Pt reports this is related to his use of entresto, will DC this medication, He says he is feeling better now  -4/19 reports this has resolved since stopping Entresto  -Denies further chest pain  15. Azotemia -4/25 Decrease spironolactone back down to 25mg , hold dose tomorrow  -4/26 Cr still up at 1.32, spironolactone held today, will contact cardiology for recommendations -09/06/22 per cardiology, would be recommended to optimize GDMT but pt resistant; can f/up outpatient; monitor routine labs while here -4/29 creatinine still little elevated at 1.29, diuretics per cardiology -Recommend he recheck with PCP/Cardiology as outpatient   LOS: 19 days A FACE TO FACE EVALUATION WAS PERFORMED  Fanny Dance 09/10/2022, 10:17 AM

## 2022-09-10 NOTE — Progress Notes (Signed)
Patient ID: Alan Tapia., male   DOB: 03-15-1976, 47 y.o.   MRN: [id number]  Covering for primary sw, Becky.  Patient approved for charity [facility] with [facility].

## 2022-09-10 NOTE — Progress Notes (Signed)
   No new recs. Has follow up.   Donato Schultz, MD

## 2022-09-11 ENCOUNTER — Ambulatory Visit: Payer: Medicaid - Out of State | Admitting: Nurse Practitioner

## 2022-09-11 ENCOUNTER — Telehealth: Payer: Self-pay | Admitting: *Deleted

## 2022-09-11 DIAGNOSIS — K5900 Constipation, unspecified: Secondary | ICD-10-CM

## 2022-09-11 NOTE — Telephone Encounter (Signed)
Transition Care Management Unsuccessful Follow-up Telephone Call  Date of discharge and from where:  09/10/22  Attempts:  1st Attempt  Reason for unsuccessful TCM follow-up call:  Unable to reach patient  All contact #'s in chart are not good numbers. Contacted patient contact father and this also is not correct.

## 2022-09-14 NOTE — Progress Notes (Deleted)
Cardiology Office Note:   Date:  09/14/2022  ID:  Alan Tapia., DOB [birth year], MRN [id number]  History of Present Illness:   Alan Tapia. is a 47 y.o. male with history of HTN, uncontrolled DMII, recently diagnosed systolic HF, and chronic left foot diabetic ulcer c/b osteomyelitis s/p BKA who presents to clinic for hospital follow-up.  Patient admitted 08/2022 for diabetic ulcer/osteomyelitis ultimately requiring left BKA. TTE that admission with LVEF 35-40% with global hypokinesis, normal RV, mild MR. Cardiology was consulted on that admission but he was unable to tolerate entresto, coreg, metop or jardiance and ultimately he was placed on spiro monotherapy.   Today, ***  Past Medical History:  Diagnosis Date   Hypertension    Uncontrolled type 2 diabetes mellitus with hyperglycemia, with long-term current use of insulin (HCC) 06/06/2022     ROS: ***  Studies Reviewed:    EKG:  ***  Cardiac Studies & Procedures       ECHOCARDIOGRAM  ECHOCARDIOGRAM COMPLETE 08/17/2022  Narrative ECHOCARDIOGRAM REPORT    Patient Name:   Alan Tapia. Date of Exam: 08/17/2022 Medical Rec #:  [id number]          Height:       76.0 in Accession #:    [id number]         Weight:       374.0 lb Date of Birth:  1975/12/16          BSA:          2.890 m Patient Age:    47 years           BP:           169/93 mmHg Patient Gender: M                  HR:           86 bpm. Exam Location:  Inpatient  Procedure: 2D Echo, Color Doppler and Cardiac Doppler  Indications:    R94.31 Abnormal EKG  History:        Patient has no prior history of Echocardiogram examinations. Risk Factors:Hypertension and Diabetes.  Sonographer:    Irving Burton Senior RDCS Referring Phys: Lorin Glass   Sonographer Comments: Suboptimal apical window due to patient body habitus. IMPRESSIONS   1. Left ventricular ejection fraction, by estimation, is 35 to 40%. The left ventricle has moderately decreased function.  The left ventricle demonstrates global hypokinesis. The left ventricular internal cavity size was mildly dilated. Indeterminate diastolic filling due to E-A fusion. 2. Right ventricular systolic function is normal. The right ventricular size is normal. Tricuspid regurgitation signal is inadequate for assessing PA pressure. 3. Left atrial size was mildly dilated. 4. The mitral valve is grossly normal. Mild mitral valve regurgitation. 5. The aortic valve is tricuspid. Aortic valve regurgitation is not visualized. 6. The inferior vena cava is dilated in size with <50% respiratory variability, suggesting right atrial pressure of 15 mmHg.  FINDINGS Left Ventricle: Left ventricular ejection fraction, by estimation, is 35 to 40%. The left ventricle has moderately decreased function. The left ventricle demonstrates global hypokinesis. The left ventricular internal cavity size was mildly dilated. There is no left ventricular hypertrophy. Indeterminate diastolic filling due to E-A fusion. Indeterminate filling pressures.  Right Ventricle: The right ventricular size is normal. No increase in right ventricular wall thickness. Right ventricular systolic function is normal. Tricuspid regurgitation signal is inadequate for assessing PA pressure.  Left Atrium: Left atrial size was mildly dilated.  Right Atrium: Right atrial size was normal in size.  Pericardium: There is no evidence of pericardial effusion.  Mitral Valve: The mitral valve is grossly normal. Mild mitral valve regurgitation.  Tricuspid Valve: The tricuspid valve is grossly normal. Tricuspid valve regurgitation is trivial.  Aortic Valve: The aortic valve is tricuspid. Aortic valve regurgitation is not visualized.  Pulmonic Valve: The pulmonic valve was grossly normal. Pulmonic valve regurgitation is not visualized.  Aorta: The aortic root and ascending aorta are structurally normal, with no evidence of dilitation.  Venous: The inferior  vena cava is dilated in size with less than 50% respiratory variability, suggesting right atrial pressure of 15 mmHg.  IAS/Shunts: No atrial level shunt detected by color flow Doppler.   LEFT VENTRICLE PLAX 2D LVIDd:         5.80 cm      Diastology LVIDs:         4.70 cm      LV e' medial:    6.53 cm/s LV PW:         1.00 cm      LV E/e' medial:  12.7 LV IVS:        0.90 cm      LV e' lateral:   6.42 cm/s LVOT diam:     2.30 cm      LV E/e' lateral: 13.0 LV SV:         78 LV SV Index:   27 LVOT Area:     4.15 cm  LV Volumes (MOD) LV vol d, MOD A2C: 169.0 ml LV vol d, MOD A4C: 182.0 ml LV vol s, MOD A2C: 107.0 ml LV vol s, MOD A4C: 114.0 ml LV SV MOD A2C:     62.0 ml LV SV MOD A4C:     182.0 ml LV SV MOD BP:      62.7 ml  RIGHT VENTRICLE RV S prime:     15.00 cm/s TAPSE (M-mode): 2.4 cm  LEFT ATRIUM           Index        RIGHT ATRIUM           Index LA diam:      4.20 cm 1.45 cm/m   RA Area:     12.40 cm LA Vol (A2C): 69.9 ml 24.18 ml/m  RA Volume:   29.70 ml  10.28 ml/m LA Vol (A4C): 83.6 ml 28.94 ml/m AORTIC VALVE LVOT Vmax:   95.10 cm/s LVOT Vmean:  67.000 cm/s LVOT VTI:    0.188 m  AORTA Ao Root diam: 3.00 cm Ao Asc diam:  2.90 cm  MITRAL VALVE MV Area (PHT): 3.34 cm    SHUNTS MV Decel Time: 227 msec    Systemic VTI:  0.19 m MV E velocity: 83.20 cm/s  Systemic Diam: 2.30 cm  Rachelle Hora Croitoru MD Electronically signed by Thurmon Fair MD Signature Date/Time: 08/17/2022/11:18:29 AM    Final              Risk Assessment/Calculations:   {Does this patient have ATRIAL FIBRILLATION?:[phone]} No BP recorded.  {Refresh Note OR Click here to enter BP  :1}***        Physical Exam:   VS:  There were no vitals taken for this visit.   Wt Readings from Last 3 Encounters:  09/10/22 (!) 345 lb 7.4 oz (156.7 kg)  08/15/22 (!) 374 lb (169.6 kg)  06/06/22 (!) 375 lb (170.1 kg)     GEN: Well nourished, well  developed in no acute distress NECK: No JVD; No  carotid bruits CARDIAC: ***RRR, no murmurs, rubs, gallops RESPIRATORY:  Clear to auscultation without rales, wheezing or rhonchi  ABDOMEN: Soft, non-tender, non-distended EXTREMITIES:  No edema; No deformity   ASSESSMENT AND PLAN:   #Chronic Systolic HF: -Patient with newly diagnosed systolic HF with LVEF 35-40% -Unknown etiology but thought to be possibly related to HTN; has several risk factors for CAD -Will check coronary CTA -Has been intolerant to entresto, coreg, metoprolol and jardiance -Has been on spiro monotherapy  #HTN: -Medication titration limited due to HTN -Continue spiro 50mg  daily  #Poorly Controlled DMII: #LBKA: -Management per primary  #Morbid Obesity: -Continue weight loss efforts    {Are you ordering a CV Procedure (e.g. stress test, cath, DCCV, TEE, etc)?   Press F2        :[id number]}   Signed, Meriam Sprague, MD

## 2022-09-15 ENCOUNTER — Encounter (HOSPITAL_COMMUNITY): Payer: Medicaid - Out of State

## 2022-09-22 ENCOUNTER — Encounter: Payer: Medicaid Other | Attending: Physical Medicine & Rehabilitation | Admitting: Physical Medicine & Rehabilitation

## 2022-09-23 ENCOUNTER — Inpatient Hospital Stay (INDEPENDENT_AMBULATORY_CARE_PROVIDER_SITE_OTHER): Payer: Medicaid - Out of State | Admitting: Primary Care

## 2022-09-24 ENCOUNTER — Encounter: Payer: Medicaid - Out of State | Admitting: Family

## 2022-09-24 ENCOUNTER — Ambulatory Visit: Payer: Medicaid - Out of State | Attending: Cardiology | Admitting: Cardiology

## 2022-09-24 ENCOUNTER — Telehealth: Payer: Self-pay | Admitting: Orthopedic Surgery

## 2022-09-24 NOTE — Telephone Encounter (Signed)
Patient missed his first post op appt with Erin today. Can you please call him to reschedule him with either Erin or Dr. Lajoyce Corners? I can open up anytime or he can see Erin Friday morning.

## 2022-09-24 NOTE — Telephone Encounter (Signed)
Noted, thank you for trying. 

## 2022-09-25 ENCOUNTER — Encounter: Payer: Self-pay | Admitting: Cardiology

## 2022-09-26 ENCOUNTER — Other Ambulatory Visit: Payer: Self-pay

## 2022-09-27 ENCOUNTER — Telehealth: Payer: Self-pay

## 2022-09-27 NOTE — Telephone Encounter (Signed)
Message to this RN CM from secretary on 5N indicated that Alan Tapia, who was discharged from their floor, his DME was broken . They already called [employer] and [employer] stated they will need a new order. Called numbers listed, would not go through. Emailed information to stated Email [email or web address] , that he will have to go through primary care to get the DME reordered.

## 2022-09-29 ENCOUNTER — Other Ambulatory Visit: Payer: Self-pay | Admitting: Physical Medicine and Rehabilitation

## 2022-09-29 ENCOUNTER — Other Ambulatory Visit (HOSPITAL_COMMUNITY): Payer: Self-pay

## 2022-09-29 MED ORDER — METHOCARBAMOL 750 MG PO TABS: 750.0000 mg | ORAL_TABLET | Freq: Four times a day (QID) | ORAL | 0 refills | Status: DC | PRN

## 2022-09-29 MED ORDER — TRAZODONE HCL 50 MG PO TABS: 25.0000 mg | ORAL_TABLET | Freq: Every evening | ORAL | 0 refills | Status: DC | PRN

## 2022-09-30 ENCOUNTER — Encounter: Payer: Medicaid - Out of State | Admitting: Family

## 2022-09-30 ENCOUNTER — Other Ambulatory Visit (HOSPITAL_COMMUNITY): Payer: Self-pay

## 2022-09-30 MED ORDER — FUROSEMIDE 40 MG PO TABS
40.0000 mg | ORAL_TABLET | Freq: Every day | ORAL | 0 refills | Status: DC
  Filled 2022-09-30: qty 30, 30d supply, fill #0

## 2022-10-03 ENCOUNTER — Other Ambulatory Visit (HOSPITAL_COMMUNITY): Payer: Self-pay

## 2022-10-03 ENCOUNTER — Encounter: Payer: Medicaid Other | Admitting: Physical Medicine & Rehabilitation

## 2022-10-08 ENCOUNTER — Encounter: Payer: Medicaid - Out of State | Admitting: Family

## 2022-10-09 ENCOUNTER — Telehealth: Payer: Self-pay | Admitting: Orthopedic Surgery

## 2022-10-09 NOTE — Telephone Encounter (Signed)
[name] [name] is requesting a referral for a wound skilled nurse to come out just to keep an eye out on how he is healing. Pt phone number is [phone]. Please call about this matter

## 2022-10-09 NOTE — Telephone Encounter (Signed)
I tried to call and sw pt and vm has not been set up yet. The pt is s/p a left BKA on 08/15/2022. He d/c the hospital on 09/10/2022. He has NS 3 office visit including one for this week. He is sch for a visit on 10/14/2022. We have not seen the pt in the office. Will eval at that time to see if the pt needs to have wound care. I will hold the message and try to reach out again to discuss with pt.

## 2022-10-13 ENCOUNTER — Encounter: Payer: Self-pay | Admitting: Cardiology

## 2022-10-13 NOTE — Telephone Encounter (Signed)
Unable to reach pt will hold this message pending appt tomorrow with Erin at 10:15.

## 2022-10-14 ENCOUNTER — Encounter: Payer: Self-pay | Admitting: Cardiology

## 2022-10-14 ENCOUNTER — Ambulatory Visit (INDEPENDENT_AMBULATORY_CARE_PROVIDER_SITE_OTHER): Payer: Self-pay | Admitting: Orthopedic Surgery

## 2022-10-14 DIAGNOSIS — Z89512 Acquired absence of left leg below knee: Secondary | ICD-10-CM

## 2022-10-14 NOTE — Telephone Encounter (Signed)
Pt was a NS for his appt again today. This makes 4 NS appts since hospital d/c. Will call pt and resch.

## 2022-10-14 NOTE — Telephone Encounter (Signed)
Pt did come in this afternoon. S/p BKA in May fall x 1 about 2 weeks ago per pt report. In office for first port op eval this afternoon.

## 2022-10-17 ENCOUNTER — Telehealth: Payer: Self-pay | Admitting: Family

## 2022-10-17 NOTE — Telephone Encounter (Signed)
[facility] denied referral due to insurance and also short staffed. I am sending referral to [facility].

## 2022-10-17 NOTE — Telephone Encounter (Signed)
Patient states he was supposed to have a referral for PT please advise

## 2022-10-17 NOTE — Telephone Encounter (Signed)
Called pt back left VM stating that I have faxed over referral to [facility], just waiting for them to determine staffing and if they accept insurance. Left message also that [facility] denied PT referral as well.

## 2022-10-20 ENCOUNTER — Encounter: Payer: Self-pay | Admitting: Cardiovascular Disease

## 2022-10-20 NOTE — Progress Notes (Signed)
No show  This encounter was created in error - please disregard.

## 2022-10-21 ENCOUNTER — Ambulatory Visit: Payer: BLUE CROSS/BLUE SHIELD | Attending: Cardiovascular Disease | Admitting: Cardiovascular Disease

## 2022-10-21 ENCOUNTER — Encounter: Payer: Self-pay | Admitting: Cardiovascular Disease

## 2022-10-26 ENCOUNTER — Encounter: Payer: Self-pay | Admitting: Orthopedic Surgery

## 2022-10-26 NOTE — Progress Notes (Signed)
Office Visit Note   Patient: Alan Tapia.           Date of Birth: June 02, 1975           MRN: [id number] Visit Date: 10/14/2022              Requested by: No referring provider defined for this encounter. PCP: Patient, No Pcp Per  Chief Complaint  Patient presents with   Left Leg - Routine Post Op    08/15/22 left BKA      HPI: Patient is a 47 year old gentleman who is 4 weeks status post left below-knee amputation.  Patient has had a fall getting into the car and was wearing his limb protector.  Assessment & Plan: Visit Diagnoses:  1. Left below-knee amputee [facility])     Plan: Staples are harvested patient will need aggressive compression.  Follow-Up Instructions: Return in about 4 weeks (around 11/11/2022).   Ortho Exam  Patient is alert, oriented, no adenopathy, well-dressed, normal affect, normal respiratory effort. Examination patient has venous and lymphatic swelling.  The incision is well-healed staples are harvested.  Patient is a new left transtibial  amputee.  Patient's current comorbidities are not expected to impact the ability to function with the prescribed prosthesis. Patient verbally communicates a strong desire to use a prosthesis. Patient currently requires mobility aids to ambulate without a prosthesis.  Expects not to use mobility aids with a new prosthesis.  Patient is a K2 level ambulator that will use a prosthesis to walk around their home and the community over low level environmental barriers.      Imaging: No results found.   Labs: Lab Results  Component Value Date   HGBA1C 10.3 (H) 08/13/2022   HGBA1C 12.3 (H) 06/07/2022   ESRSEDRATE 107 (H) 08/13/2022   ESRSEDRATE 119 (H) 06/07/2022   CRP 10.5 (H) 06/07/2022   REPTSTATUS 08/18/2022 FINAL 08/13/2022   CULT  08/13/2022    NO GROWTH 5 DAYS Performed at [facility], [address], [place], [name] [address]      Lab Results  Component Value Date   ALBUMIN <1.5 (L)  08/23/2022   ALBUMIN <1.5 (L) 06/07/2022   ALBUMIN <1.5 (L) 06/06/2022   PREALBUMIN 8 (L) 08/13/2022    Lab Results  Component Value Date   MG 1.7 08/19/2022   No results found for: "VD25OH"  Lab Results  Component Value Date   PREALBUMIN 8 (L) 08/13/2022      Latest Ref Rng & Units 09/08/2022    6:07 AM 09/01/2022    5:43 AM 08/25/2022    6:48 AM  CBC EXTENDED  WBC 4.0 - 10.5 K/uL 6.4  6.5  7.0   RBC 4.22 - 5.81 MIL/uL 3.94  3.88  3.99   Hemoglobin 13.0 - 17.0 g/dL 9.4  9.0  9.2   HCT 19.1 - 52.0 % 31.3  30.8  31.0   Platelets 150 - 400 K/uL 310  355  396      There is no height or weight on file to calculate BMI.  Orders:  No orders of the defined types were placed in this encounter.  No orders of the defined types were placed in this encounter.    Procedures: No procedures performed  Clinical Data: No additional findings.  ROS:  All other systems negative, except as noted in the HPI. Review of Systems  Objective: Vital Signs: There were no vitals taken for this visit.  Specialty Comments:  No specialty  comments available.  PMFS History: Patient Active Problem List   Diagnosis Date Noted   Constipation 09/11/2022   Type 2 diabetes mellitus with hyperglycemia, with long-term current use of insulin (HCC) 09/09/2022   Depressed left ventricular ejection fraction 09/05/2022   Azotemia 09/04/2022   Phantom pain 09/02/2022   Adjustment disorder 08/29/2022   Acute systolic CHF (congestive heart failure) (HCC) 08/27/2022   Primary hypertension 08/27/2022   Dilated cardiomyopathy (HCC) 08/26/2022   Below-knee amputation of left lower extremity (HCC) 08/22/2022   Lymphedema 08/15/2022   Severe protein-calorie malnutrition (HCC) 08/15/2022   Sepsis (HCC) 08/15/2022   Subacute osteomyelitis, left ankle and foot (HCC) 08/13/2022   Hypokalemia 08/13/2022   Diabetic foot infection (HCC) 06/06/2022   Uncontrolled type 2 diabetes mellitus with hyperglycemia,  with long-term current use of insulin (HCC) 06/06/2022   Hypertensive urgency 06/06/2022   Past Medical History:  Diagnosis Date   Hypertension    Uncontrolled type 2 diabetes mellitus with hyperglycemia, with long-term current use of insulin (HCC) 06/06/2022    Family History  Problem Relation Age of Onset   Arthritis Mother    Allergies Father    Hypertension Father    Diabetes Father     Past Surgical History:  Procedure Laterality Date   AMPUTATION Left 08/15/2022   Procedure: LEFT BELOW KNEE AMPUTATION;  Surgeon: Nadara Mustard, MD;  Location: [facility] OR;  Service: Orthopedics;  Laterality: Left;   Social History   Occupational History    Comment: Just moved to [place] from [name]  Tobacco Use   Smoking status: Never   Smokeless tobacco: Never  Vaping Use   Vaping Use: Never used  Substance and Sexual Activity   Alcohol use: No   Drug use: No   Sexual activity: Not on file

## 2022-10-27 ENCOUNTER — Ambulatory Visit: Payer: Medicaid Other | Admitting: Family Medicine

## 2022-10-29 ENCOUNTER — Telehealth (INDEPENDENT_AMBULATORY_CARE_PROVIDER_SITE_OTHER): Payer: Self-pay | Admitting: Primary Care

## 2022-10-29 NOTE — Telephone Encounter (Signed)
Left VM with pt.

## 2022-10-30 ENCOUNTER — Inpatient Hospital Stay (INDEPENDENT_AMBULATORY_CARE_PROVIDER_SITE_OTHER): Payer: BLUE CROSS/BLUE SHIELD | Admitting: Primary Care

## 2022-11-06 ENCOUNTER — Inpatient Hospital Stay: Payer: Medicaid - Out of State | Admitting: Critical Care Medicine

## 2022-11-11 ENCOUNTER — Encounter: Payer: Medicaid Other | Admitting: Family

## 2022-11-14 ENCOUNTER — Encounter
Payer: BLUE CROSS/BLUE SHIELD | Attending: Physical Medicine & Rehabilitation | Admitting: Physical Medicine & Rehabilitation

## 2022-12-14 ENCOUNTER — Emergency Department (HOSPITAL_COMMUNITY)
Admission: EM | Admit: 2022-12-14 | Discharge: 2022-12-14 | Discharge status: Against Medical Advice | Payer: Medicaid Other | Attending: Emergency Medicine | Admitting: Emergency Medicine

## 2022-12-14 ENCOUNTER — Other Ambulatory Visit: Payer: Self-pay

## 2022-12-14 ENCOUNTER — Encounter (HOSPITAL_COMMUNITY): Payer: Self-pay | Admitting: Emergency Medicine

## 2022-12-14 ENCOUNTER — Emergency Department (HOSPITAL_COMMUNITY): Payer: Medicaid Other

## 2022-12-14 DIAGNOSIS — R079 Chest pain, unspecified: Secondary | ICD-10-CM | POA: Diagnosis present

## 2022-12-14 DIAGNOSIS — I5023 Acute on chronic systolic (congestive) heart failure: Secondary | ICD-10-CM | POA: Insufficient documentation

## 2022-12-14 DIAGNOSIS — R1011 Right upper quadrant pain: Secondary | ICD-10-CM | POA: Diagnosis not present

## 2022-12-14 DIAGNOSIS — Z794 Long term (current) use of insulin: Secondary | ICD-10-CM | POA: Insufficient documentation

## 2022-12-14 DIAGNOSIS — Z79899 Other long term (current) drug therapy: Secondary | ICD-10-CM | POA: Insufficient documentation

## 2022-12-14 DIAGNOSIS — R1013 Epigastric pain: Secondary | ICD-10-CM | POA: Diagnosis not present

## 2022-12-14 DIAGNOSIS — R1012 Left upper quadrant pain: Secondary | ICD-10-CM | POA: Insufficient documentation

## 2022-12-14 DIAGNOSIS — Z5329 Procedure and treatment not carried out because of patient's decision for other reasons: Secondary | ICD-10-CM | POA: Insufficient documentation

## 2022-12-14 DIAGNOSIS — R1010 Upper abdominal pain, unspecified: Secondary | ICD-10-CM

## 2022-12-14 DIAGNOSIS — E11621 Type 2 diabetes mellitus with foot ulcer: Secondary | ICD-10-CM | POA: Diagnosis not present

## 2022-12-14 DIAGNOSIS — I110 Hypertensive heart disease with heart failure: Secondary | ICD-10-CM | POA: Insufficient documentation

## 2022-12-14 DIAGNOSIS — L97511 Non-pressure chronic ulcer of other part of right foot limited to breakdown of skin: Secondary | ICD-10-CM | POA: Diagnosis not present

## 2022-12-14 DIAGNOSIS — Z89512 Acquired absence of left leg below knee: Secondary | ICD-10-CM | POA: Diagnosis not present

## 2022-12-14 DIAGNOSIS — L089 Local infection of the skin and subcutaneous tissue, unspecified: Secondary | ICD-10-CM

## 2022-12-14 LAB — CBC WITH DIFFERENTIAL/PLATELET
Abs Immature Granulocytes: 0.07 10*3/uL (ref 0.00–0.07)
Basophils Absolute: 0.1 10*3/uL (ref 0.0–0.1)
Basophils Relative: 1 %
Eosinophils Absolute: 0.2 10*3/uL (ref 0.0–0.5)
Eosinophils Relative: 2 %
HCT: 39.3 % (ref 39.0–52.0)
Hemoglobin: 11.6 g/dL — ABNORMAL LOW (ref 13.0–17.0)
Immature Granulocytes: 1 %
Lymphocytes Relative: 13 %
Lymphs Abs: 1.7 10*3/uL (ref 0.7–4.0)
MCH: 23.2 pg — ABNORMAL LOW (ref 26.0–34.0)
MCHC: 29.5 g/dL — ABNORMAL LOW (ref 30.0–36.0)
MCV: 78.6 fL — ABNORMAL LOW (ref 80.0–100.0)
Monocytes Absolute: 0.7 10*3/uL (ref 0.1–1.0)
Monocytes Relative: 6 %
Neutro Abs: 10 10*3/uL — ABNORMAL HIGH (ref 1.7–7.7)
Neutrophils Relative %: 77 %
Platelets: 378 10*3/uL (ref 150–400)
RBC: 5 MIL/uL (ref 4.22–5.81)
RDW: 14.6 % (ref 11.5–15.5)
WBC: 12.8 10*3/uL — ABNORMAL HIGH (ref 4.0–10.5)
nRBC: 0 % (ref 0.0–0.2)

## 2022-12-14 LAB — I-STAT CHEM 8, ED
BUN: 15 mg/dL (ref 6–20)
Calcium, Ion: 1.07 mmol/L — ABNORMAL LOW (ref 1.15–1.40)
Chloride: 106 mmol/L (ref 98–111)
Creatinine, Ser: 1.6 mg/dL — ABNORMAL HIGH (ref 0.61–1.24)
Glucose, Bld: 270 mg/dL — ABNORMAL HIGH (ref 70–99)
HCT: 36 % — ABNORMAL LOW (ref 39.0–52.0)
Hemoglobin: 12.2 g/dL — ABNORMAL LOW (ref 13.0–17.0)
Potassium: 4.3 mmol/L (ref 3.5–5.1)
Sodium: 136 mmol/L (ref 135–145)
TCO2: 23 mmol/L (ref 22–32)

## 2022-12-14 LAB — CBG MONITORING, ED: Glucose-Capillary: 287 mg/dL — ABNORMAL HIGH (ref 70–99)

## 2022-12-14 LAB — COMPREHENSIVE METABOLIC PANEL
ALT: 10 U/L (ref 0–44)
AST: 13 U/L — ABNORMAL LOW (ref 15–41)
Albumin: 1.6 g/dL — ABNORMAL LOW (ref 3.5–5.0)
Alkaline Phosphatase: 124 U/L (ref 38–126)
Anion gap: 7 (ref 5–15)
BUN: 14 mg/dL (ref 6–20)
CO2: 21 mmol/L — ABNORMAL LOW (ref 22–32)
Calcium: 7.9 mg/dL — ABNORMAL LOW (ref 8.9–10.3)
Chloride: 106 mmol/L (ref 98–111)
Creatinine, Ser: 1.56 mg/dL — ABNORMAL HIGH (ref 0.61–1.24)
GFR, Estimated: 55 mL/min — ABNORMAL LOW (ref >60)
Glucose, Bld: 259 mg/dL — ABNORMAL HIGH (ref 70–99)
Potassium: 4.2 mmol/L (ref 3.5–5.1)
Sodium: 134 mmol/L — ABNORMAL LOW (ref 135–145)
Total Bilirubin: 0.4 mg/dL (ref 0.3–1.2)
Total Protein: 5.5 g/dL — ABNORMAL LOW (ref 6.5–8.1)

## 2022-12-14 LAB — TROPONIN I (HIGH SENSITIVITY): Troponin I (High Sensitivity): 11 ng/L (ref <18)

## 2022-12-14 LAB — PROTIME-INR
INR: 1.1 (ref 0.8–1.2)
Prothrombin Time: 14.4 seconds (ref 11.4–15.2)

## 2022-12-14 LAB — I-STAT CG4 LACTIC ACID, ED: Lactic Acid, Venous: 1.2 mmol/L (ref 0.5–1.9)

## 2022-12-14 LAB — BRAIN NATRIURETIC PEPTIDE: B Natriuretic Peptide: 197.3 pg/mL — ABNORMAL HIGH (ref 0.0–100.0)

## 2022-12-14 LAB — APTT: aPTT: 33 seconds (ref 24–36)

## 2022-12-14 LAB — MAGNESIUM: Magnesium: 1.9 mg/dL (ref 1.7–2.4)

## 2022-12-14 MED ORDER — LACTATED RINGERS IV BOLUS: 1000.0000 mL | Freq: Once | INTRAVENOUS | Status: DC

## 2022-12-14 MED ORDER — LACTATED RINGERS IV BOLUS: 500.0000 mL | Freq: Once | INTRAVENOUS | Status: DC

## 2022-12-14 MED ORDER — FUROSEMIDE 10 MG/ML IJ SOLN: 40.0000 mg | Freq: Once | INTRAMUSCULAR | Status: DC

## 2022-12-14 NOTE — ED Triage Notes (Signed)
Patient was BIB [facility] from home. EMS reported early this morning patient was having shortness of breath and upper abdominal pain. Patient was on new Last treatment last time he was in the hospital to take more fluid in the lungs. Patient have 10/10 upper abdominal pain toward the center of chest with palpation/. He was given Albuterol 5 mg. Patient also vomited with EMS. Patient wears CPAP at night and uses oxygen at home 6 L at in the morning and at night. 12 EKG by EMS showed prolong QT and 1 degree heart block.. Patient is also been treated by the wound care center. Vital signs: 116/78, HR 74, RR 14, 92% 8 liters.

## 2022-12-14 NOTE — ED Notes (Signed)
Patient left AMA and possible with the IV. RN did try to call the patient and wife and left a message on the wife's phone, but no one call back.

## 2022-12-14 NOTE — Progress Notes (Signed)
   12/14/22 1216  Spiritual Encounters  Type of Visit Initial  Care provided to: Pt and family  Referral source Nurse (RN/NT/LPN)  Reason for visit  (Quality of Care at [facility])  OnCall Visit Yes  Intervention Outcomes  Outcomes Awareness of support   The pt asked the nurse to page the Chaplain's Office, but the nurse did not know why the pt requested a chaplain. Chaplain Alan arrived to provide [facility], the pt expressed his experience of low quality care with the medical staff. Inhospitality from his assigned nurse, inadequate clinical practices with damaged equipment attached to the bedside, and complications with recent blood draw. The pt experienced pain and bleeding at the blood drawn site. The pt requested to be transferred to [facility], or discharged from [facility] today. Chaplain Alan reached out to the nurse secretaries on staff in the ED to page the pt's doctor for request to transfer, and to initiate a phone call to the clinical Child psychotherapist. The clinical social worker did not pick up.   Alan Tapia, [place] Intern BAT [phone]

## 2022-12-14 NOTE — ED Provider Notes (Signed)
EMERGENCY DEPARTMENT AT [facility] Provider Note   CSN: [id number] Arrival date & time: 12/14/22  0820     History  Chief Complaint  Patient presents with   Shortness of Breath   Abdominal Pain    Alan Tapia. is a 47 y.o. male with past medical history significant for uncontrolled type 2 diabetes, hypertension, CHF, left BKA presents to the ED by EMS from home with chest pain, shortness of breath, generalized weakness, and upper abdominal pain.  Patient also reports that he feels "out of it" and "can't concentrate".  He feels that his symptoms are related to taking Lasix and other heart medications around 12AM.  He states that he has been taking these medications for "a little while".  Patient's wife at bedside states he had sudden onset of symptoms about 6AM.  She is also concerned about wound on his foot, but he is being seen by wound care.  She reports she did not have gauze at home, but was able to dress it with a sanitary napkin and ACE wrap.  Patient has associated nausea with his abdominal pain, but no vomiting.  Denies fever, chills, dizziness, light-headedness, diarrhea, syncope.         Home Medications Prior to Admission medications   Medication Sig Start Date End Date Taking? Authorizing Provider  acetaminophen (TYLENOL) 325 MG tablet Take 2 tablets (650 mg total) by mouth 3 (three) times daily. 09/09/22   Love, Evlyn Kanner, PA-C  ascorbic acid (VITAMIN C) 1000 MG tablet Take 1 tablet (1,000 mg total) by mouth daily. 09/09/22   Love, Evlyn Kanner, PA-C  furosemide (LASIX) 40 MG tablet Take 1 tablet (40 mg total) by mouth daily. 09/30/22   Fanny Dance, MD  gabapentin (NEURONTIN) 400 MG capsule Take 1 capsule (400 mg total) by mouth 3 (three) times daily. 09/09/22   Love, Evlyn Kanner, PA-C  hydrocerin (EUCERIN) CREA Apply 1 Application topically 2 (two) times daily. 09/09/22   Love, Evlyn Kanner, PA-C  insulin aspart (NOVOLOG) 100 UNIT/ML FlexPen Inject 0-11  Units into the skin 3 (three) times daily with meals. Use per sliding scale insulin 09/09/22   Love, Evlyn Kanner, PA-C  insulin glargine (SEMGLEE, YFGN,) 100 UNIT/ML Solostar Pen Inject 12 Units into the skin daily. 09/09/22   Love, Evlyn Kanner, PA-C  Insulin Pen Needle 32G X 4 MM MISC Use needles with insulin pen daily at 6 (six) AM. 09/09/22   Love, Evlyn Kanner, PA-C  melatonin 5 MG TABS Take 1 tablet (5 mg total) by mouth at bedtime. 09/09/22   Love, Evlyn Kanner, PA-C  methocarbamol (ROBAXIN) 750 MG tablet Take 1 tablet (750 mg total) by mouth every 6 (six) hours as needed for muscle spasms. 09/29/22   Fanny Dance, MD  metoprolol tartrate (LOPRESSOR) 100 MG tablet Take 1 tablet (100 mg total) by mouth 2 (two) times daily. 09/09/22   Love, Evlyn Kanner, PA-C  Multiple Vitamin (MULTIVITAMIN WITH MINERALS) TABS tablet Take 1 tablet by mouth daily. 08/23/22   Lorin Glass, MD  oxyCODONE (OXY IR/ROXICODONE) 5 MG immediate release tablet Take 0.5-1 tablets (2.5-5 mg total) by mouth daily as needed for severe pain. 09/09/22   Love, Evlyn Kanner, PA-C  polyethylene glycol powder (GLYCOLAX/MIRALAX) 17 GM/SCOOP powder Take 1 capful (17 g) by mouth daily. 09/09/22   Love, Evlyn Kanner, PA-C  spironolactone (ALDACTONE) 50 MG tablet Take 1 tablet (50 mg total) by mouth daily. 09/09/22   Jacquelynn Cree, PA-C  traZODone (  DESYREL) 50 MG tablet Take 0.5-1 tablets (25-50 mg total) by mouth at bedtime as needed for sleep. 09/29/22   Fanny Dance, MD  zinc oxide 20 % ointment Apply topically as needed for irritation. 09/09/22   Love, Evlyn Kanner, PA-C      Allergies    Fish allergy, Benadryl [diphenhydramine], Coreg [carvedilol], and Vancomycin    Review of Systems   Review of Systems  Constitutional:  Negative for chills and fever.  Respiratory:  Positive for shortness of breath.   Cardiovascular:  Positive for chest pain.  Gastrointestinal:  Positive for abdominal pain and nausea. Negative for diarrhea and vomiting.  Neurological:   Positive for weakness (generalized). Negative for dizziness, syncope and light-headedness.    Physical Exam Updated Vital Signs BP 102/72 (BP Location: Left Arm)   Pulse 74   Temp 97.9 F (36.6 C) (Oral)   Resp 20   Ht 6\' 4"  (1.93 m)   Wt (!) 144.2 kg   SpO2 100%   BMI 38.71 kg/m  Physical Exam Vitals and nursing note reviewed.  Constitutional:      General: He is not in acute distress.    Appearance: He is ill-appearing. He is not toxic-appearing or diaphoretic.     Comments: Patient appears uncomfortable  HENT:     Mouth/Throat:     Mouth: Mucous membranes are moist.     Pharynx: Oropharynx is clear.  Eyes:     Extraocular Movements: Extraocular movements intact.     Pupils: Pupils are equal, round, and reactive to light.  Cardiovascular:     Rate and Rhythm: Normal rate and regular rhythm.     Pulses: Normal pulses.     Heart sounds: Normal heart sounds.  Pulmonary:     Effort: Pulmonary effort is normal. No tachypnea or respiratory distress.     Breath sounds: Normal air entry. Examination of the left-middle field reveals rales. Examination of the left-lower field reveals decreased breath sounds and rales. Decreased breath sounds and rales present.  Abdominal:     General: Abdomen is flat. Bowel sounds are normal. There is no distension.     Palpations: Abdomen is soft.     Tenderness: There is abdominal tenderness in the right upper quadrant, epigastric area and left upper quadrant. There is no guarding or rebound.  Musculoskeletal:     Right lower leg: 2+ Edema present.     Left Lower Extremity: Left leg is amputated below knee.  Feet:     Right foot:     Skin integrity: Ulcer, skin breakdown and dry skin present.     Comments: Large wound with eschar to the plantar surface over the heel on the right foot.  Skin is peeling and cracking.  Wound is malodorous.  Skin:    General: Skin is warm and dry.     Capillary Refill: Capillary refill takes less than 2 seconds.   Neurological:     Mental Status: He is alert. Mental status is at baseline.  Psychiatric:        Mood and Affect: Mood normal.        Behavior: Behavior normal.        ED Results / Procedures / Treatments   Labs (all labs ordered are listed, but only abnormal results are displayed) Labs Reviewed  COMPREHENSIVE METABOLIC PANEL - Abnormal; Notable for the following components:      Result Value   Sodium 134 (*)    CO2 21 (*)    Glucose,  Bld 259 (*)    Creatinine, Ser 1.56 (*)    Calcium 7.9 (*)    Total Protein 5.5 (*)    Albumin 1.6 (*)    AST 13 (*)    GFR, Estimated 55 (*)    All other components within normal limits  CBC WITH DIFFERENTIAL/PLATELET - Abnormal; Notable for the following components:   WBC 12.8 (*)    Hemoglobin 11.6 (*)    MCV 78.6 (*)    MCH 23.2 (*)    MCHC 29.5 (*)    Neutro Abs 10.0 (*)    All other components within normal limits  BRAIN NATRIURETIC PEPTIDE - Abnormal; Notable for the following components:   B Natriuretic Peptide 197.3 (*)    All other components within normal limits  I-STAT CHEM 8, ED - Abnormal; Notable for the following components:   Creatinine, Ser 1.60 (*)    Glucose, Bld 270 (*)    Calcium, Ion 1.07 (*)    Hemoglobin 12.2 (*)    HCT 36.0 (*)    All other components within normal limits  CBG MONITORING, ED - Abnormal; Notable for the following components:   Glucose-Capillary 287 (*)    All other components within normal limits  CULTURE, BLOOD (ROUTINE X 2)  CULTURE, BLOOD (ROUTINE X 2)  PROTIME-INR  APTT  MAGNESIUM  I-STAT CG4 LACTIC ACID, ED  TROPONIN I (HIGH SENSITIVITY)    EKG EKG Interpretation Date/Time:  Sunday December 14 2022 08:25:17 EDT Ventricular Rate:  74 PR Interval:  179 QRS Duration:  101 QT Interval:  439 QTC Calculation: 488 R Axis:   16  Text Interpretation: Sinus rhythm RSR' in V1 or V2, right VCD or RVH Abnormal T, consider ischemia, lateral leads Non-specific biphasic t-wave changes in  leads II, V2-V6 compared to prior EKG Confirmed by Elayne Snare ([id number]) on 12/14/2022 8:42:35 AM  Radiology No results found.  Procedures Procedures    Medications Ordered in ED Medications - No data to display   ED Course/ Medical Decision Making/ A&P                                 Medical Decision Making Amount and/or Complexity of Data Reviewed Labs: ordered. Radiology: ordered.  Risk Prescription drug management.   This patient presents to the ED with chief complaint(s) of chest pain, shortness of breath, abdominal pain with pertinent past medical history of diabetes, CKD, CHF, cardiomyopathy, hypertension.  The complaint involves an extensive differential diagnosis and also carries with it a high risk of complications and morbidity.    The differential diagnosis includes ACS, PE, sepsis, pancreatitis, bowel obstruction, electrolyte disturbance, metabolic derangement, metabolic encephalopathy, hypoglycemia, hyperglycemia, DKA    The initial plan is to obtain labs, ACS workup, infection workup  Additional history obtained: Additional history obtained from spouse who is at bedside; EMS state that patient vomited during transport.  He was given albuterol 5 mg.  Patient wears CPAP at night and uses oxygen at home, 6 L in the AM and at night.  EKG by EMS showed prolonged QT and 1st degree heart block.  He was 92% on 8L with EMS. Records reviewed  - patient is currently being followed by several specialists through [facility].  BMP from 11/20/22 demonstrates mild hypocalcemia, no other electrolyte disturbance, normal Cr and GFR.  Initial Assessment:   Exam significant for ill-appearing patient who is not in acute respiratory distress.  Patient appears uncomfortable and is swaying back and forth in the bed.  Lungs with diminished breath sounds in the bases, L > R.  There are also rales.  Abdomen is soft with tenderness across upper quadrants.  Bowel sounds normal.  Patient is  moving extremities appropriately.  He is alert and oriented.  He is on oxygen via nasal cannula.  Skin is warm and dry.  Heart rate is normal in the 70s with regular rhythm.  Sinus on the monitor.  Large foot ulcer appreciated on the right with eschar and skin peeling.  Wound is malodorous.  No active drainage.   Independent ECG/labs interpretation:  The following labs were independently interpreted:  CBC with leukocytosis and anemia.  Metabolic panel with hyperglycemia.  Cr is elevated above patient's baseline.  Corrected calcium within normal range.  Initial troponin 11.    Independent visualization and interpretation of imaging: I independently visualized the following imaging with scope of interpretation limited to determining acute life threatening conditions related to emergency care: chest x-ray, which revealed moderate left pleural effusion, bilateral mid and lower lung opacities which may reflect pulmonary edema and/or multifocal pneumonia.   Treatment and Reassessment: Ordered lasix for pulmonary edema.  Patient refusing Lasix and states that medication "caused his symptoms" and he "already took some at midnight".  Will order CT of chest/abd/pelvis to further assess patient's chest and abdominal pain.  Discussed with patient benefit of imaging to determine cause of his symptoms.    Patient currently refusing all therapies and imaging stating that he has appointments set up with [facility] this week to workup his ongoing conditions.  Discussed with patient imaging and labs would be beneficial to determine potential cause for his acute condition.  Patient states he believes it is all related to his medications and he is feeling better.  Patient inquiring about transfer to [facility].  Advised patient we would be unable to do that without a complete workup.  After discussion with his wife, patient has decided to leave AGAINST MEDICAL ADVICE. Discussed with patient that it is our  recommendation he stay and complete his workup, however, he states he does not wish to stay.   Disposition:   Discussed with patient about benefits of completing his workup such as finding an emergent condition that may require hospitalization or intervention.  Discussed risks of leaving before workup is complete including worsening of his condition which may cause further complications or even death.  Patient verbalizes his understanding of the benefits and the risks.  Patient determined to have decision making capacity.  Patient has decided to leave AGAINST MEDICAL ADVICE and signed appropriate form.  Advised patient should he change his mind, he may return to the ED at any point.           Final Clinical Impression(s) / ED Diagnoses Final diagnoses:  Acute on chronic systolic congestive heart failure (HCC)  Pain of upper abdomen  Diabetic foot infection [facility])    Rx / DC Orders ED Discharge Orders     None         Lenard Simmer, PA-C 12/17/22 0556    Rexford Maus, DO 12/25/22 [phone]

## 2022-12-25 ENCOUNTER — Encounter (HOSPITAL_COMMUNITY): Payer: Self-pay | Admitting: Orthopedic Surgery

## 2023-04-06 ENCOUNTER — Emergency Department (HOSPITAL_COMMUNITY): Payer: Medicaid Other

## 2023-04-06 ENCOUNTER — Inpatient Hospital Stay (HOSPITAL_COMMUNITY)
Admission: EM | Admit: 2023-04-06 | Discharge: 2023-06-29 | DRG: 853 | Disposition: A | Discharge status: Skilled Nursing Facility | Payer: Medicaid Other | Attending: Internal Medicine | Admitting: Internal Medicine

## 2023-04-06 DIAGNOSIS — Z91199 Patient's noncompliance with other medical treatment and regimen due to unspecified reason: Secondary | ICD-10-CM

## 2023-04-06 DIAGNOSIS — F913 Oppositional defiant disorder: Secondary | ICD-10-CM | POA: Diagnosis not present

## 2023-04-06 DIAGNOSIS — E11628 Type 2 diabetes mellitus with other skin complications: Secondary | ICD-10-CM | POA: Diagnosis not present

## 2023-04-06 DIAGNOSIS — J9811 Atelectasis: Secondary | ICD-10-CM | POA: Diagnosis present

## 2023-04-06 DIAGNOSIS — Z6838 Body mass index (BMI) 38.0-38.9, adult: Secondary | ICD-10-CM

## 2023-04-06 DIAGNOSIS — L89892 Pressure ulcer of other site, stage 2: Secondary | ICD-10-CM | POA: Diagnosis present

## 2023-04-06 DIAGNOSIS — A419 Sepsis, unspecified organism: Secondary | ICD-10-CM | POA: Diagnosis not present

## 2023-04-06 DIAGNOSIS — Z89511 Acquired absence of right leg below knee: Secondary | ICD-10-CM

## 2023-04-06 DIAGNOSIS — Z79899 Other long term (current) drug therapy: Secondary | ICD-10-CM

## 2023-04-06 DIAGNOSIS — F4320 Adjustment disorder, unspecified: Secondary | ICD-10-CM | POA: Diagnosis present

## 2023-04-06 DIAGNOSIS — I10 Essential (primary) hypertension: Secondary | ICD-10-CM | POA: Diagnosis present

## 2023-04-06 DIAGNOSIS — E8770 Fluid overload, unspecified: Secondary | ICD-10-CM | POA: Diagnosis not present

## 2023-04-06 DIAGNOSIS — D649 Anemia, unspecified: Secondary | ICD-10-CM

## 2023-04-06 DIAGNOSIS — T447X6A Underdosing of beta-adrenoreceptor antagonists, initial encounter: Secondary | ICD-10-CM | POA: Diagnosis not present

## 2023-04-06 DIAGNOSIS — R197 Diarrhea, unspecified: Secondary | ICD-10-CM

## 2023-04-06 DIAGNOSIS — I130 Hypertensive heart and chronic kidney disease with heart failure and stage 1 through stage 4 chronic kidney disease, or unspecified chronic kidney disease: Secondary | ICD-10-CM | POA: Diagnosis present

## 2023-04-06 DIAGNOSIS — J90 Pleural effusion, not elsewhere classified: Secondary | ICD-10-CM | POA: Diagnosis present

## 2023-04-06 DIAGNOSIS — L02611 Cutaneous abscess of right foot: Secondary | ICD-10-CM

## 2023-04-06 DIAGNOSIS — D62 Acute posthemorrhagic anemia: Secondary | ICD-10-CM | POA: Diagnosis not present

## 2023-04-06 DIAGNOSIS — Z91128 Patient's intentional underdosing of medication regimen for other reason: Secondary | ICD-10-CM

## 2023-04-06 DIAGNOSIS — M86271 Subacute osteomyelitis, right ankle and foot: Secondary | ICD-10-CM

## 2023-04-06 DIAGNOSIS — D638 Anemia in other chronic diseases classified elsewhere: Secondary | ICD-10-CM | POA: Diagnosis present

## 2023-04-06 DIAGNOSIS — Z751 Person awaiting admission to adequate facility elsewhere: Secondary | ICD-10-CM

## 2023-04-06 DIAGNOSIS — Z87892 Personal history of anaphylaxis: Secondary | ICD-10-CM

## 2023-04-06 DIAGNOSIS — E1122 Type 2 diabetes mellitus with diabetic chronic kidney disease: Secondary | ICD-10-CM | POA: Diagnosis present

## 2023-04-06 DIAGNOSIS — K5900 Constipation, unspecified: Secondary | ICD-10-CM | POA: Diagnosis not present

## 2023-04-06 DIAGNOSIS — K649 Unspecified hemorrhoids: Secondary | ICD-10-CM | POA: Diagnosis not present

## 2023-04-06 DIAGNOSIS — U071 COVID-19: Secondary | ICD-10-CM

## 2023-04-06 DIAGNOSIS — I959 Hypotension, unspecified: Secondary | ICD-10-CM | POA: Diagnosis present

## 2023-04-06 DIAGNOSIS — Y92230 Patient room in hospital as the place of occurrence of the external cause: Secondary | ICD-10-CM | POA: Diagnosis not present

## 2023-04-06 DIAGNOSIS — E1169 Type 2 diabetes mellitus with other specified complication: Secondary | ICD-10-CM | POA: Diagnosis present

## 2023-04-06 DIAGNOSIS — E1142 Type 2 diabetes mellitus with diabetic polyneuropathy: Secondary | ICD-10-CM | POA: Diagnosis present

## 2023-04-06 DIAGNOSIS — E872 Acidosis, unspecified: Secondary | ICD-10-CM

## 2023-04-06 DIAGNOSIS — I5040 Unspecified combined systolic (congestive) and diastolic (congestive) heart failure: Secondary | ICD-10-CM

## 2023-04-06 DIAGNOSIS — M62838 Other muscle spasm: Secondary | ICD-10-CM | POA: Diagnosis not present

## 2023-04-06 DIAGNOSIS — E875 Hyperkalemia: Secondary | ICD-10-CM | POA: Diagnosis present

## 2023-04-06 DIAGNOSIS — E11621 Type 2 diabetes mellitus with foot ulcer: Secondary | ICD-10-CM | POA: Diagnosis present

## 2023-04-06 DIAGNOSIS — Z89512 Acquired absence of left leg below knee: Secondary | ICD-10-CM

## 2023-04-06 DIAGNOSIS — J189 Pneumonia, unspecified organism: Secondary | ICD-10-CM

## 2023-04-06 DIAGNOSIS — K3184 Gastroparesis: Secondary | ICD-10-CM | POA: Diagnosis present

## 2023-04-06 DIAGNOSIS — D509 Iron deficiency anemia, unspecified: Secondary | ICD-10-CM | POA: Diagnosis present

## 2023-04-06 DIAGNOSIS — I739 Peripheral vascular disease, unspecified: Secondary | ICD-10-CM

## 2023-04-06 DIAGNOSIS — G8929 Other chronic pain: Secondary | ICD-10-CM

## 2023-04-06 DIAGNOSIS — K2970 Gastritis, unspecified, without bleeding: Secondary | ICD-10-CM | POA: Diagnosis not present

## 2023-04-06 DIAGNOSIS — E66812 Obesity, class 2: Secondary | ICD-10-CM | POA: Diagnosis present

## 2023-04-06 DIAGNOSIS — G547 Phantom limb syndrome without pain: Secondary | ICD-10-CM | POA: Diagnosis present

## 2023-04-06 DIAGNOSIS — Z881 Allergy status to other antibiotic agents status: Secondary | ICD-10-CM

## 2023-04-06 DIAGNOSIS — E785 Hyperlipidemia, unspecified: Secondary | ICD-10-CM | POA: Diagnosis present

## 2023-04-06 DIAGNOSIS — Z91148 Patient's other noncompliance with medication regimen for other reason: Secondary | ICD-10-CM

## 2023-04-06 DIAGNOSIS — R0789 Other chest pain: Secondary | ICD-10-CM | POA: Diagnosis not present

## 2023-04-06 DIAGNOSIS — E66813 Obesity, class 3: Secondary | ICD-10-CM | POA: Insufficient documentation

## 2023-04-06 DIAGNOSIS — Z8249 Family history of ischemic heart disease and other diseases of the circulatory system: Secondary | ICD-10-CM

## 2023-04-06 DIAGNOSIS — K7460 Unspecified cirrhosis of liver: Secondary | ICD-10-CM | POA: Diagnosis not present

## 2023-04-06 DIAGNOSIS — W06XXXA Fall from bed, initial encounter: Secondary | ICD-10-CM | POA: Diagnosis not present

## 2023-04-06 DIAGNOSIS — E8809 Other disorders of plasma-protein metabolism, not elsewhere classified: Secondary | ICD-10-CM | POA: Diagnosis present

## 2023-04-06 DIAGNOSIS — E739 Lactose intolerance, unspecified: Secondary | ICD-10-CM | POA: Diagnosis present

## 2023-04-06 DIAGNOSIS — I5023 Acute on chronic systolic (congestive) heart failure: Secondary | ICD-10-CM | POA: Diagnosis not present

## 2023-04-06 DIAGNOSIS — L8990 Pressure ulcer of unspecified site, unspecified stage: Secondary | ICD-10-CM

## 2023-04-06 DIAGNOSIS — R6520 Severe sepsis without septic shock: Secondary | ICD-10-CM | POA: Diagnosis present

## 2023-04-06 DIAGNOSIS — E1152 Type 2 diabetes mellitus with diabetic peripheral angiopathy with gangrene: Secondary | ICD-10-CM | POA: Diagnosis present

## 2023-04-06 DIAGNOSIS — Z833 Family history of diabetes mellitus: Secondary | ICD-10-CM

## 2023-04-06 DIAGNOSIS — Z888 Allergy status to other drugs, medicaments and biological substances status: Secondary | ICD-10-CM

## 2023-04-06 DIAGNOSIS — E1165 Type 2 diabetes mellitus with hyperglycemia: Secondary | ICD-10-CM | POA: Diagnosis present

## 2023-04-06 DIAGNOSIS — L089 Local infection of the skin and subcutaneous tissue, unspecified: Principal | ICD-10-CM

## 2023-04-06 DIAGNOSIS — I420 Dilated cardiomyopathy: Secondary | ICD-10-CM | POA: Diagnosis present

## 2023-04-06 DIAGNOSIS — E1143 Type 2 diabetes mellitus with diabetic autonomic (poly)neuropathy: Secondary | ICD-10-CM | POA: Diagnosis present

## 2023-04-06 DIAGNOSIS — L309 Dermatitis, unspecified: Secondary | ICD-10-CM | POA: Diagnosis not present

## 2023-04-06 DIAGNOSIS — Z8261 Family history of arthritis: Secondary | ICD-10-CM

## 2023-04-06 DIAGNOSIS — N182 Chronic kidney disease, stage 2 (mild): Secondary | ICD-10-CM

## 2023-04-06 DIAGNOSIS — L97519 Non-pressure chronic ulcer of other part of right foot with unspecified severity: Secondary | ICD-10-CM | POA: Diagnosis present

## 2023-04-06 DIAGNOSIS — N179 Acute kidney failure, unspecified: Secondary | ICD-10-CM | POA: Diagnosis present

## 2023-04-06 DIAGNOSIS — E440 Moderate protein-calorie malnutrition: Secondary | ICD-10-CM

## 2023-04-06 DIAGNOSIS — Z91013 Allergy to seafood: Secondary | ICD-10-CM

## 2023-04-06 DIAGNOSIS — Z794 Long term (current) use of insulin: Secondary | ICD-10-CM

## 2023-04-06 DIAGNOSIS — E119 Type 2 diabetes mellitus without complications: Secondary | ICD-10-CM

## 2023-04-06 DIAGNOSIS — M86171 Other acute osteomyelitis, right ankle and foot: Secondary | ICD-10-CM

## 2023-04-06 LAB — COMPREHENSIVE METABOLIC PANEL
ALT: 13 U/L (ref 0–44)
AST: 17 U/L (ref 15–41)
Albumin: <1.5 g/dL — ABNORMAL LOW (ref 3.5–5.0)
Alkaline Phosphatase: 299 U/L — ABNORMAL HIGH (ref 38–126)
Anion gap: 6 (ref 5–15)
BUN: 13 mg/dL (ref 6–20)
CO2: 21 mmol/L — ABNORMAL LOW (ref 22–32)
Calcium: 6.7 mg/dL — ABNORMAL LOW (ref 8.9–10.3)
Chloride: 106 mmol/L (ref 98–111)
Creatinine, Ser: 1.4 mg/dL — ABNORMAL HIGH (ref 0.61–1.24)
GFR, Estimated: >60 mL/min (ref >60)
Glucose, Bld: 113 mg/dL — ABNORMAL HIGH (ref 70–99)
Potassium: 3.5 mmol/L (ref 3.5–5.1)
Sodium: 133 mmol/L — ABNORMAL LOW (ref 135–145)
Total Bilirubin: 0.5 mg/dL (ref <1.2)
Total Protein: 6.4 g/dL — ABNORMAL LOW (ref 6.5–8.1)

## 2023-04-06 LAB — CBC WITH DIFFERENTIAL/PLATELET
Abs Immature Granulocytes: 0.22 10*3/uL — ABNORMAL HIGH (ref 0.00–0.07)
Basophils Absolute: 0 10*3/uL (ref 0.0–0.1)
Basophils Relative: 0 %
Eosinophils Absolute: 0 10*3/uL (ref 0.0–0.5)
Eosinophils Relative: 0 %
HCT: 25.4 % — ABNORMAL LOW (ref 39.0–52.0)
Hemoglobin: 7.5 g/dL — ABNORMAL LOW (ref 13.0–17.0)
Immature Granulocytes: 1 %
Lymphocytes Relative: 7 %
Lymphs Abs: 1.5 10*3/uL (ref 0.7–4.0)
MCH: 21.9 pg — ABNORMAL LOW (ref 26.0–34.0)
MCHC: 29.5 g/dL — ABNORMAL LOW (ref 30.0–36.0)
MCV: 74.3 fL — ABNORMAL LOW (ref 80.0–100.0)
Monocytes Absolute: 1.8 10*3/uL — ABNORMAL HIGH (ref 0.1–1.0)
Monocytes Relative: 8 %
Neutro Abs: 19.8 10*3/uL — ABNORMAL HIGH (ref 1.7–7.7)
Neutrophils Relative %: 84 %
Platelets: 516 10*3/uL — ABNORMAL HIGH (ref 150–400)
RBC: 3.42 MIL/uL — ABNORMAL LOW (ref 4.22–5.81)
RDW: 17.5 % — ABNORMAL HIGH (ref 11.5–15.5)
WBC: 23.3 10*3/uL — ABNORMAL HIGH (ref 4.0–10.5)
nRBC: 0 % (ref 0.0–0.2)

## 2023-04-06 LAB — PROTIME-INR
INR: 1.8 — ABNORMAL HIGH (ref 0.8–1.2)
Prothrombin Time: 21.1 s — ABNORMAL HIGH (ref 11.4–15.2)

## 2023-04-06 LAB — I-STAT CG4 LACTIC ACID, ED: Lactic Acid, Venous: 2.8 mmol/L (ref 0.5–1.9)

## 2023-04-06 LAB — POC OCCULT BLOOD, ED: Fecal Occult Bld: NEGATIVE

## 2023-04-06 MED ORDER — ONDANSETRON HCL 4 MG/2ML IJ SOLN
4.0000 mg | Freq: Once | INTRAMUSCULAR | Status: AC
  Administered 2023-04-06: 4 mg via INTRAVENOUS
  Filled 2023-04-06: qty 2

## 2023-04-06 MED ORDER — PIPERACILLIN-TAZOBACTAM 3.375 G IVPB 30 MIN
3.3750 g | Freq: Once | INTRAVENOUS | Status: DC
  Filled 2023-04-06: qty 50

## 2023-04-06 MED ORDER — METRONIDAZOLE 500 MG/100ML IV SOLN
500.0000 mg | Freq: Once | INTRAVENOUS | Status: AC
  Administered 2023-04-06: 500 mg via INTRAVENOUS
  Filled 2023-04-06: qty 100

## 2023-04-06 MED ORDER — SODIUM CHLORIDE 0.9 % IV SOLN
2.0000 g | Freq: Three times a day (TID) | INTRAVENOUS | Status: DC
  Administered 2023-04-07: 2 g via INTRAVENOUS
  Filled 2023-04-06: qty 12.5

## 2023-04-06 MED ORDER — VANCOMYCIN HCL 2000 MG/400ML IV SOLN
2000.0000 mg | Freq: Once | INTRAVENOUS | Status: DC
  Filled 2023-04-06: qty 400

## 2023-04-06 MED ORDER — MORPHINE SULFATE (PF) 4 MG/ML IV SOLN
4.0000 mg | Freq: Once | INTRAVENOUS | Status: AC
  Administered 2023-04-06: 4 mg via INTRAVENOUS
  Filled 2023-04-06: qty 1

## 2023-04-06 NOTE — ED Notes (Signed)
Significantly larger right arm that patient reported being new. Patient arm appears to be swollen. Provider aware.

## 2023-04-06 NOTE — ED Notes (Signed)
Patient stated that he is allergic to Vancomycin and cannot have it. Patient stated "it give me hives, SOB and discoloration my skin"

## 2023-04-06 NOTE — ED Provider Notes (Signed)
[place] EMERGENCY DEPARTMENT AT [facility] Provider Note   CSN: [id number] Arrival date & time: 04/06/23  2153     History {Add pertinent medical, surgical, social history, OB history to HPI:1} No chief complaint on file.  HPI Alan Tapia. is a 47 y.o. male with history of type 2 diabetes, CHF, BKA of left lower extremity and ongoing diabetic foot infection presenting for nausea vomiting diarrhea and right foot wound.  States the right foot wound has been there for several months.  He attributes it to taking hydrochlorothiazide.  Denies any recent trauma.  States he is being followed by 1 pill.  States he has been on Keflex for several months as well for treatment.  Also states he has been applying honey intermittently to the foot.  Nausea vomiting diarrhea started 2 weeks ago.  States his abdomen hurts all over.  Endorses fever at home as well.  States he has been taking Tylenol and ibuprofen.  HPI     Home Medications Prior to Admission medications   Medication Sig Start Date End Date Taking? Authorizing Provider  acetaminophen (TYLENOL) 325 MG tablet Take 2 tablets (650 mg total) by mouth 3 (three) times daily. 09/09/22   Love, Evlyn Kanner, PA-C  ascorbic acid (VITAMIN C) 1000 MG tablet Take 1 tablet (1,000 mg total) by mouth daily. 09/09/22   Love, Evlyn Kanner, PA-C  furosemide (LASIX) 40 MG tablet Take 1 tablet (40 mg total) by mouth daily. 09/30/22   Fanny Dance, MD  gabapentin (NEURONTIN) 400 MG capsule Take 1 capsule (400 mg total) by mouth 3 (three) times daily. 09/09/22   Love, Evlyn Kanner, PA-C  hydrocerin (EUCERIN) CREA Apply 1 Application topically 2 (two) times daily. 09/09/22   Love, Evlyn Kanner, PA-C  insulin aspart (NOVOLOG) 100 UNIT/ML FlexPen Inject 0-11 Units into the skin 3 (three) times daily with meals. Use per sliding scale insulin 09/09/22   Love, Evlyn Kanner, PA-C  insulin glargine (SEMGLEE, YFGN,) 100 UNIT/ML Solostar Pen Inject 12 Units into the skin  daily. 09/09/22   Love, Evlyn Kanner, PA-C  Insulin Pen Needle 32G X 4 MM MISC Use needles with insulin pen daily at 6 (six) AM. 09/09/22   Love, Evlyn Kanner, PA-C  melatonin 5 MG TABS Take 1 tablet (5 mg total) by mouth at bedtime. 09/09/22   Love, Evlyn Kanner, PA-C  methocarbamol (ROBAXIN) 750 MG tablet Take 1 tablet (750 mg total) by mouth every 6 (six) hours as needed for muscle spasms. 09/29/22   Fanny Dance, MD  metoprolol tartrate (LOPRESSOR) 100 MG tablet Take 1 tablet (100 mg total) by mouth 2 (two) times daily. 09/09/22   Love, Evlyn Kanner, PA-C  Multiple Vitamin (MULTIVITAMIN WITH MINERALS) TABS tablet Take 1 tablet by mouth daily. 08/23/22   Lorin Glass, MD  oxyCODONE (OXY IR/ROXICODONE) 5 MG immediate release tablet Take 0.5-1 tablets (2.5-5 mg total) by mouth daily as needed for severe pain. 09/09/22   Love, Evlyn Kanner, PA-C  polyethylene glycol powder (GLYCOLAX/MIRALAX) 17 GM/SCOOP powder Take 1 capful (17 g) by mouth daily. 09/09/22   Love, Evlyn Kanner, PA-C  spironolactone (ALDACTONE) 50 MG tablet Take 1 tablet (50 mg total) by mouth daily. 09/09/22   Love, Evlyn Kanner, PA-C  traZODone (DESYREL) 50 MG tablet Take 0.5-1 tablets (25-50 mg total) by mouth at bedtime as needed for sleep. 09/29/22   Fanny Dance, MD  zinc oxide 20 % ointment Apply topically as needed for irritation. 09/09/22   Delle Reining  S, PA-C      Allergies    Fish allergy, Benadryl [diphenhydramine], Coreg [carvedilol], and Vancomycin    Review of Systems   See HPI  Physical Exam Updated Vital Signs BP (!) 165/79 (BP Location: Left Arm)   Pulse (!) 117   Temp 99.7 F (37.6 C) (Rectal)   Resp 18   Ht 6\' 4"  (1.93 m)   Wt (!) 145.2 kg   SpO2 96%   BMI 38.95 kg/m  Physical Exam Vitals and nursing note reviewed.  HENT:     Head: Normocephalic and atraumatic.     Mouth/Throat:     Mouth: Mucous membranes are moist.  Eyes:     General:        Right eye: No discharge.        Left eye: No discharge.      Conjunctiva/sclera: Conjunctivae normal.  Cardiovascular:     Rate and Rhythm: Normal rate and regular rhythm.     Pulses: Normal pulses.     Heart sounds: Normal heart sounds.  Pulmonary:     Effort: Pulmonary effort is normal.     Breath sounds: Normal breath sounds.  Abdominal:     General: Abdomen is flat.     Palpations: Abdomen is soft.  Musculoskeletal:     Left Lower Extremity: Left leg is amputated below knee.  Feet:     Right foot:     Skin integrity: Skin breakdown and warmth present.  Skin:    General: Skin is warm and dry.  Neurological:     General: No focal deficit present.  Psychiatric:        Mood and Affect: Mood normal.          ED Results / Procedures / Treatments   Labs (all labs ordered are listed, but only abnormal results are displayed) Labs Reviewed  CULTURE, BLOOD (ROUTINE X 2)  CULTURE, BLOOD (ROUTINE X 2)  COMPREHENSIVE METABOLIC PANEL  CBC WITH DIFFERENTIAL/PLATELET  PROTIME-INR  URINALYSIS, W/ REFLEX TO CULTURE (INFECTION SUSPECTED)  I-STAT CG4 LACTIC ACID, ED    EKG None  Radiology No results found.  Procedures Procedures  {Document cardiac monitor, telemetry assessment procedure when appropriate:1}  Medications Ordered in ED Medications - No data to display  ED Course/ Medical Decision Making/ A&P   {   Click here for ABCD2, HEART and other calculatorsREFRESH Note before signing :1}                              Medical Decision Making Amount and/or Complexity of Data Reviewed Labs: ordered. Radiology: ordered.   ***  {Document critical care time when appropriate:1} {Document review of labs and clinical decision tools ie heart score, Chads2Vasc2 etc:1}  {Document your independent review of radiology images, and any outside records:1} {Document your discussion with family members, caretakers, and with consultants:1} {Document social determinants of health affecting pt's care:1} {Document your decision making why  or why not admission, treatments were needed:1} Final Clinical Impression(s) / ED Diagnoses Final diagnoses:  None    Rx / DC Orders ED Discharge Orders     None

## 2023-04-06 NOTE — ED Triage Notes (Signed)
Patient BIB EMS for N/V/D uncontrollable for several days. Patient running fever for several days. Has wound left foot, large amount of foul smelling drainage to dressing. Has not been seen at wound center yet. Possible sepsis.

## 2023-04-06 NOTE — ED Provider Notes (Signed)
Patient presented to the ED for evaluation of fever malodorous wound in his foot.  Patient has a significant diabetic foot infection.  Patient had been evaluated by physicians associated with [facility].  Patient was admitted to the hospital back in August. Physical Exam  BP (!) 165/79 (BP Location: Left Arm)   Pulse (!) 117   Temp 99.7 F (37.6 C) (Rectal)   Resp 18   Ht 1.93 m (6\' 4" )   Wt (!) 145.2 kg   SpO2 96%   BMI 38.95 kg/m   Physical Exam Vitals and nursing note reviewed.  Constitutional:      Appearance: He is well-developed. He is ill-appearing.  HENT:     Head: Normocephalic and atraumatic.     Right Ear: External ear normal.     Left Ear: External ear normal.  Eyes:     General: No scleral icterus.       Right eye: No discharge.        Left eye: No discharge.     Conjunctiva/sclera: Conjunctivae normal.  Neck:     Trachea: No tracheal deviation.  Cardiovascular:     Rate and Rhythm: Normal rate.  Pulmonary:     Effort: Pulmonary effort is normal. No respiratory distress.     Breath sounds: No stridor.  Abdominal:     General: There is no distension.  Musculoskeletal:        General: Tenderness present. No swelling or deformity.     Cervical back: Neck supple.     Comments: Malodorous wounds noted on the patient's right foot, there is a large necrotic left heel wound with a significant portion of his heel avulsed from the underlying tissue, purulent drainage noted, wounds also noted on the proximal aspect of the foot  Skin:    General: Skin is warm and dry.     Findings: No rash.     Comments: Pulses not palpable  Neurological:     Mental Status: He is alert. Mental status is at baseline.     Cranial Nerves: No dysarthria or facial asymmetry.     Motor: No seizure activity.     Procedures  .Critical Care  Performed by: Linwood Dibbles, MD Authorized by: Linwood Dibbles, MD   Critical care provider statement:    Critical care time (minutes):   30   Critical care was time spent personally by me on the following activities:  Development of treatment plan with patient or surrogate, discussions with consultants, evaluation of patient's response to treatment, examination of patient, ordering and review of laboratory studies, ordering and review of radiographic studies, ordering and performing treatments and interventions, pulse oximetry, re-evaluation of patient's condition and review of old charts   ED Course / MDM   Clinical Course as of 04/07/23 0026  Tue Apr 07, 2023  0025 Case discussed with Dr Janalyn Shy [JK]    Clinical Course User Index [JK] Linwood Dibbles, MD   Medical Decision Making Amount and/or Complexity of Data Reviewed Labs: ordered. Radiology: ordered.  Risk Prescription drug management.   Patient has a severe foot wound.  Suspect large amount of gangrenous tissue in his foot.  Suspect osteomyelitis.  Concerns for evolving sepsis.  Will start broad-spectrum antibiotics.  Patient will require admission to the hospital.  Likely will require surgical debridement intervention.       Linwood Dibbles, MD 04/06/23 Janetta Hora    Linwood Dibbles, MD 04/07/23 [phone]

## 2023-04-06 NOTE — ED Notes (Signed)
Rectal exam completed by Provider Lynelle Doctor with nurse and tech at bedside with wife present in room.

## 2023-04-06 NOTE — ED Notes (Addendum)
Lactic 2.82 per mini lab . Provider notified.

## 2023-04-07 ENCOUNTER — Other Ambulatory Visit: Payer: Self-pay

## 2023-04-07 ENCOUNTER — Encounter (HOSPITAL_COMMUNITY): Payer: Self-pay | Admitting: Internal Medicine

## 2023-04-07 DIAGNOSIS — I420 Dilated cardiomyopathy: Secondary | ICD-10-CM | POA: Diagnosis present

## 2023-04-07 DIAGNOSIS — E66812 Obesity, class 2: Secondary | ICD-10-CM | POA: Diagnosis not present

## 2023-04-07 DIAGNOSIS — Z89512 Acquired absence of left leg below knee: Secondary | ICD-10-CM

## 2023-04-07 DIAGNOSIS — J189 Pneumonia, unspecified organism: Secondary | ICD-10-CM

## 2023-04-07 DIAGNOSIS — E11628 Type 2 diabetes mellitus with other skin complications: Secondary | ICD-10-CM | POA: Diagnosis present

## 2023-04-07 DIAGNOSIS — L89892 Pressure ulcer of other site, stage 2: Secondary | ICD-10-CM | POA: Diagnosis present

## 2023-04-07 DIAGNOSIS — E1165 Type 2 diabetes mellitus with hyperglycemia: Secondary | ICD-10-CM | POA: Diagnosis present

## 2023-04-07 DIAGNOSIS — E440 Moderate protein-calorie malnutrition: Secondary | ICD-10-CM | POA: Diagnosis present

## 2023-04-07 DIAGNOSIS — I5023 Acute on chronic systolic (congestive) heart failure: Secondary | ICD-10-CM | POA: Diagnosis not present

## 2023-04-07 DIAGNOSIS — D649 Anemia, unspecified: Secondary | ICD-10-CM | POA: Diagnosis not present

## 2023-04-07 DIAGNOSIS — I5040 Unspecified combined systolic (congestive) and diastolic (congestive) heart failure: Secondary | ICD-10-CM

## 2023-04-07 DIAGNOSIS — D62 Acute posthemorrhagic anemia: Secondary | ICD-10-CM | POA: Diagnosis not present

## 2023-04-07 DIAGNOSIS — R6520 Severe sepsis without septic shock: Secondary | ICD-10-CM

## 2023-04-07 DIAGNOSIS — E1152 Type 2 diabetes mellitus with diabetic peripheral angiopathy with gangrene: Secondary | ICD-10-CM | POA: Diagnosis present

## 2023-04-07 DIAGNOSIS — N179 Acute kidney failure, unspecified: Secondary | ICD-10-CM

## 2023-04-07 DIAGNOSIS — M86171 Other acute osteomyelitis, right ankle and foot: Secondary | ICD-10-CM

## 2023-04-07 DIAGNOSIS — E119 Type 2 diabetes mellitus without complications: Secondary | ICD-10-CM | POA: Diagnosis not present

## 2023-04-07 DIAGNOSIS — L089 Local infection of the skin and subcutaneous tissue, unspecified: Secondary | ICD-10-CM | POA: Diagnosis not present

## 2023-04-07 DIAGNOSIS — L02611 Cutaneous abscess of right foot: Secondary | ICD-10-CM | POA: Diagnosis present

## 2023-04-07 DIAGNOSIS — E1142 Type 2 diabetes mellitus with diabetic polyneuropathy: Secondary | ICD-10-CM | POA: Diagnosis present

## 2023-04-07 DIAGNOSIS — A419 Sepsis, unspecified organism: Secondary | ICD-10-CM

## 2023-04-07 DIAGNOSIS — N182 Chronic kidney disease, stage 2 (mild): Secondary | ICD-10-CM

## 2023-04-07 DIAGNOSIS — E872 Acidosis, unspecified: Secondary | ICD-10-CM | POA: Diagnosis present

## 2023-04-07 DIAGNOSIS — M86271 Subacute osteomyelitis, right ankle and foot: Secondary | ICD-10-CM | POA: Diagnosis present

## 2023-04-07 DIAGNOSIS — I739 Peripheral vascular disease, unspecified: Secondary | ICD-10-CM

## 2023-04-07 DIAGNOSIS — E1122 Type 2 diabetes mellitus with diabetic chronic kidney disease: Secondary | ICD-10-CM | POA: Diagnosis present

## 2023-04-07 DIAGNOSIS — J9811 Atelectasis: Secondary | ICD-10-CM | POA: Diagnosis present

## 2023-04-07 DIAGNOSIS — I10 Essential (primary) hypertension: Secondary | ICD-10-CM

## 2023-04-07 DIAGNOSIS — J90 Pleural effusion, not elsewhere classified: Secondary | ICD-10-CM | POA: Diagnosis present

## 2023-04-07 DIAGNOSIS — Z794 Long term (current) use of insulin: Secondary | ICD-10-CM

## 2023-04-07 DIAGNOSIS — Z89511 Acquired absence of right leg below knee: Secondary | ICD-10-CM | POA: Diagnosis not present

## 2023-04-07 DIAGNOSIS — W06XXXA Fall from bed, initial encounter: Secondary | ICD-10-CM | POA: Diagnosis not present

## 2023-04-07 DIAGNOSIS — Y92230 Patient room in hospital as the place of occurrence of the external cause: Secondary | ICD-10-CM | POA: Diagnosis not present

## 2023-04-07 DIAGNOSIS — U071 COVID-19: Secondary | ICD-10-CM | POA: Diagnosis not present

## 2023-04-07 DIAGNOSIS — D509 Iron deficiency anemia, unspecified: Secondary | ICD-10-CM | POA: Diagnosis present

## 2023-04-07 DIAGNOSIS — I130 Hypertensive heart and chronic kidney disease with heart failure and stage 1 through stage 4 chronic kidney disease, or unspecified chronic kidney disease: Secondary | ICD-10-CM | POA: Diagnosis present

## 2023-04-07 DIAGNOSIS — R197 Diarrhea, unspecified: Secondary | ICD-10-CM

## 2023-04-07 DIAGNOSIS — I5042 Chronic combined systolic (congestive) and diastolic (congestive) heart failure: Secondary | ICD-10-CM

## 2023-04-07 DIAGNOSIS — G894 Chronic pain syndrome: Secondary | ICD-10-CM | POA: Diagnosis not present

## 2023-04-07 LAB — CORTISOL-AM, BLOOD: Cortisol - AM: 19.9 ug/dL (ref 6.7–22.6)

## 2023-04-07 LAB — URINALYSIS, W/ REFLEX TO CULTURE (INFECTION SUSPECTED)
Bacteria, UA: NONE SEEN
Bilirubin Urine: NEGATIVE
Glucose, UA: 150 mg/dL — AB
Hgb urine dipstick: NEGATIVE
Ketones, ur: NEGATIVE mg/dL
Leukocytes,Ua: NEGATIVE
Nitrite: NEGATIVE
Protein, ur: >=300 mg/dL — AB
Specific Gravity, Urine: 1.024 (ref 1.005–1.030)
pH: 5 (ref 5.0–8.0)

## 2023-04-07 LAB — HEMOGLOBIN AND HEMATOCRIT, BLOOD
HCT: 20.5 % — ABNORMAL LOW (ref 39.0–52.0)
HCT: 25.1 % — ABNORMAL LOW (ref 39.0–52.0)
HCT: 27.7 % — ABNORMAL LOW (ref 39.0–52.0)
Hemoglobin: 5.9 g/dL — CL (ref 13.0–17.0)
Hemoglobin: 7.7 g/dL — ABNORMAL LOW (ref 13.0–17.0)
Hemoglobin: 8.5 g/dL — ABNORMAL LOW (ref 13.0–17.0)

## 2023-04-07 LAB — COMPREHENSIVE METABOLIC PANEL
ALT: 10 U/L (ref 0–44)
AST: 12 U/L — ABNORMAL LOW (ref 15–41)
Albumin: <1.5 g/dL — ABNORMAL LOW (ref 3.5–5.0)
Alkaline Phosphatase: 261 U/L — ABNORMAL HIGH (ref 38–126)
Anion gap: 7 (ref 5–15)
BUN: 13 mg/dL (ref 6–20)
CO2: 21 mmol/L — ABNORMAL LOW (ref 22–32)
Calcium: 6.9 mg/dL — ABNORMAL LOW (ref 8.9–10.3)
Chloride: 107 mmol/L (ref 98–111)
Creatinine, Ser: 1.38 mg/dL — ABNORMAL HIGH (ref 0.61–1.24)
GFR, Estimated: >60 mL/min (ref >60)
Glucose, Bld: 106 mg/dL — ABNORMAL HIGH (ref 70–99)
Potassium: 3.5 mmol/L (ref 3.5–5.1)
Sodium: 135 mmol/L (ref 135–145)
Total Bilirubin: 0.7 mg/dL (ref <1.2)
Total Protein: 5.9 g/dL — ABNORMAL LOW (ref 6.5–8.1)

## 2023-04-07 LAB — PROCALCITONIN: Procalcitonin: >150 ng/mL

## 2023-04-07 LAB — CBC
HCT: 22.5 % — ABNORMAL LOW (ref 39.0–52.0)
Hemoglobin: 6.3 g/dL — CL (ref 13.0–17.0)
MCH: 21.4 pg — ABNORMAL LOW (ref 26.0–34.0)
MCHC: 28 g/dL — ABNORMAL LOW (ref 30.0–36.0)
MCV: 76.5 fL — ABNORMAL LOW (ref 80.0–100.0)
Platelets: 461 10*3/uL — ABNORMAL HIGH (ref 150–400)
RBC: 2.94 MIL/uL — ABNORMAL LOW (ref 4.22–5.81)
RDW: 17.5 % — ABNORMAL HIGH (ref 11.5–15.5)
WBC: 18.2 10*3/uL — ABNORMAL HIGH (ref 4.0–10.5)
nRBC: 0.1 % (ref 0.0–0.2)

## 2023-04-07 LAB — PREALBUMIN: Prealbumin: <5 mg/dL — ABNORMAL LOW (ref 18–38)

## 2023-04-07 LAB — PREPARE RBC (CROSSMATCH)

## 2023-04-07 LAB — GLUCOSE, CAPILLARY
Glucose-Capillary: 100 mg/dL — ABNORMAL HIGH (ref 70–99)
Glucose-Capillary: 111 mg/dL — ABNORMAL HIGH (ref 70–99)
Glucose-Capillary: 122 mg/dL — ABNORMAL HIGH (ref 70–99)
Glucose-Capillary: 132 mg/dL — ABNORMAL HIGH (ref 70–99)
Glucose-Capillary: 92 mg/dL (ref 70–99)

## 2023-04-07 LAB — LIPID PANEL
Cholesterol: 83 mg/dL (ref 0–200)
HDL: 11 mg/dL — ABNORMAL LOW (ref >40)
LDL Cholesterol: 47 mg/dL (ref 0–99)
Total CHOL/HDL Ratio: 7.5 {ratio}
Triglycerides: 123 mg/dL (ref <150)
VLDL: 25 mg/dL (ref 0–40)

## 2023-04-07 LAB — C-REACTIVE PROTEIN: CRP: 18.2 mg/dL — ABNORMAL HIGH (ref <1.0)

## 2023-04-07 LAB — TYPE AND SCREEN
ABO/RH(D): O POS
Antibody Screen: NEGATIVE

## 2023-04-07 LAB — ABO/RH: ABO/RH(D): O POS

## 2023-04-07 LAB — LACTIC ACID, PLASMA
Lactic Acid, Venous: 1.4 mmol/L (ref 0.5–1.9)
Lactic Acid, Venous: 1 mmol/L (ref 0.5–1.9)

## 2023-04-07 LAB — HEMOGLOBIN A1C
Hgb A1c MFr Bld: 6.8 % — ABNORMAL HIGH (ref 4.8–5.6)
Mean Plasma Glucose: 148.46 mg/dL

## 2023-04-07 LAB — SEDIMENTATION RATE: Sed Rate: 100 mm/h — ABNORMAL HIGH (ref 0–16)

## 2023-04-07 LAB — PROTIME-INR
INR: 1.8 — ABNORMAL HIGH (ref 0.8–1.2)
Prothrombin Time: 21.3 s — ABNORMAL HIGH (ref 11.4–15.2)

## 2023-04-07 LAB — GAMMA GT: GGT: 154 U/L — ABNORMAL HIGH (ref 7–50)

## 2023-04-07 LAB — STREP PNEUMONIAE URINARY ANTIGEN: Strep Pneumo Urinary Antigen: NEGATIVE

## 2023-04-07 MED ORDER — SODIUM CHLORIDE 0.9% FLUSH: 3.0000 mL | INTRAVENOUS | Status: DC | PRN

## 2023-04-07 MED ORDER — SODIUM CHLORIDE 0.9 % IV SOLN: 250.0000 mL | INTRAVENOUS | Status: DC | PRN

## 2023-04-07 MED ORDER — SODIUM CHLORIDE 0.9% IV SOLUTION: Freq: Once | INTRAVENOUS | Status: AC

## 2023-04-07 MED ORDER — SODIUM CHLORIDE 0.9% IV SOLUTION: Freq: Once | INTRAVENOUS | Status: DC

## 2023-04-07 MED ORDER — ENSURE ENLIVE PO LIQD
237.0000 mL | Freq: Two times a day (BID) | ORAL | Status: DC
  Administered 2023-04-09 – 2023-06-10 (×88): 237 mL via ORAL

## 2023-04-07 MED ORDER — SODIUM CHLORIDE 0.9% FLUSH
3.0000 mL | Freq: Two times a day (BID) | INTRAVENOUS | Status: DC
  Administered 2023-04-07: 3 mL via INTRAVENOUS

## 2023-04-07 MED ORDER — ORAL CARE MOUTH RINSE: 15.0000 mL | OROMUCOSAL | Status: DC | PRN

## 2023-04-07 MED ORDER — SODIUM CHLORIDE 0.9% FLUSH: 3.0000 mL | Freq: Two times a day (BID) | INTRAVENOUS | Status: DC

## 2023-04-07 MED ORDER — CHLORHEXIDINE GLUCONATE 4 % EX SOLN
60.0000 mL | Freq: Once | CUTANEOUS | Status: AC
  Administered 2023-04-08: 4 via TOPICAL

## 2023-04-07 MED ORDER — DOXYCYCLINE HYCLATE 100 MG IV SOLR
100.0000 mg | Freq: Two times a day (BID) | INTRAVENOUS | Status: DC
  Administered 2023-04-07 – 2023-04-09 (×4): 100 mg via INTRAVENOUS
  Filled 2023-04-07 (×7): qty 100

## 2023-04-07 MED ORDER — SODIUM CHLORIDE 0.9% FLUSH
3.0000 mL | Freq: Two times a day (BID) | INTRAVENOUS | Status: DC
  Administered 2023-04-07 – 2023-05-09 (×48): 3 mL via INTRAVENOUS

## 2023-04-07 MED ORDER — LACTATED RINGERS IV SOLN: 150.0000 mL/h | INTRAVENOUS | Status: DC

## 2023-04-07 MED ORDER — SODIUM CHLORIDE 0.9% IV SOLUTION
Freq: Once | INTRAVENOUS | Status: DC
Start: 2023-04-07 — End: 2023-04-12

## 2023-04-07 MED ORDER — ACETAMINOPHEN 650 MG RE SUPP: 650.0000 mg | Freq: Four times a day (QID) | RECTAL | Status: DC | PRN

## 2023-04-07 MED ORDER — DIPHENHYDRAMINE HCL 25 MG PO CAPS
25.0000 mg | ORAL_CAPSULE | Freq: Once | ORAL | Status: AC
  Administered 2023-04-07: 25 mg via ORAL
  Filled 2023-04-07: qty 1

## 2023-04-07 MED ORDER — FUROSEMIDE 10 MG/ML IJ SOLN
20.0000 mg | Freq: Once | INTRAMUSCULAR | Status: AC
  Administered 2023-04-07: 20 mg via INTRAVENOUS
  Filled 2023-04-07: qty 2

## 2023-04-07 MED ORDER — LACTATED RINGERS IV BOLUS (SEPSIS): 1000.0000 mL | Freq: Once | INTRAVENOUS | Status: DC

## 2023-04-07 MED ORDER — ALBUMIN HUMAN 25 % IV SOLN
50.0000 g | Freq: Once | INTRAVENOUS | Status: AC
  Administered 2023-04-07: 50 g via INTRAVENOUS
  Filled 2023-04-07: qty 200

## 2023-04-07 MED ORDER — ONDANSETRON HCL 4 MG PO TABS
4.0000 mg | ORAL_TABLET | Freq: Four times a day (QID) | ORAL | Status: DC | PRN
  Administered 2023-04-19 – 2023-06-24 (×6): 4 mg via ORAL
  Filled 2023-04-07 (×7): qty 1

## 2023-04-07 MED ORDER — MORPHINE SULFATE (PF) 2 MG/ML IV SOLN
1.0000 mg | INTRAVENOUS | Status: DC | PRN
  Administered 2023-04-08: 1 mg via INTRAVENOUS
  Filled 2023-04-07: qty 1

## 2023-04-07 MED ORDER — ENOXAPARIN SODIUM 80 MG/0.8ML IJ SOSY
70.0000 mg | PREFILLED_SYRINGE | INTRAMUSCULAR | Status: DC
  Administered 2023-04-07: 70 mg via SUBCUTANEOUS

## 2023-04-07 MED ORDER — LACTATED RINGERS IV SOLN: INTRAVENOUS | Status: AC

## 2023-04-07 MED ORDER — SODIUM CHLORIDE 0.9 % IV SOLN
2000.0000 mg | INTRAVENOUS | Status: DC
  Filled 2023-04-07: qty 20

## 2023-04-07 MED ORDER — DIPHENHYDRAMINE HCL 50 MG/ML IJ SOLN
12.5000 mg | Freq: Once | INTRAMUSCULAR | Status: AC
  Administered 2023-04-07: 12.5 mg via INTRAVENOUS
  Filled 2023-04-07: qty 1

## 2023-04-07 MED ORDER — ONDANSETRON HCL 4 MG/2ML IJ SOLN
4.0000 mg | Freq: Four times a day (QID) | INTRAMUSCULAR | Status: DC | PRN
  Administered 2023-04-08 – 2023-05-04 (×8): 4 mg via INTRAVENOUS
  Filled 2023-04-07 (×11): qty 2

## 2023-04-07 MED ORDER — ACETAMINOPHEN 325 MG PO TABS
650.0000 mg | ORAL_TABLET | Freq: Once | ORAL | Status: AC
  Administered 2023-04-07: 650 mg via ORAL
  Filled 2023-04-07: qty 2

## 2023-04-07 MED ORDER — LACTATED RINGERS IV BOLUS (SEPSIS)
1000.0000 mL | Freq: Once | INTRAVENOUS | Status: AC
  Administered 2023-04-07: 1000 mL via INTRAVENOUS

## 2023-04-07 MED ORDER — BISACODYL 5 MG PO TBEC: 5.0000 mg | DELAYED_RELEASE_TABLET | Freq: Every day | ORAL | Status: DC | PRN

## 2023-04-07 MED ORDER — PIPERACILLIN-TAZOBACTAM 3.375 G IVPB
3.3750 g | Freq: Three times a day (TID) | INTRAVENOUS | Status: AC
  Administered 2023-04-07 – 2023-04-09 (×8): 3.375 g via INTRAVENOUS
  Filled 2023-04-07 (×8): qty 50

## 2023-04-07 MED ORDER — LACTATED RINGERS IV BOLUS (SEPSIS): 800.0000 mL | Freq: Once | INTRAVENOUS | Status: DC

## 2023-04-07 MED ORDER — ACETAMINOPHEN 325 MG PO TABS: 650.0000 mg | ORAL_TABLET | Freq: Four times a day (QID) | ORAL | Status: DC | PRN

## 2023-04-07 NOTE — ED Notes (Signed)
ED TO INPATIENT HANDOFF REPORT  Name/Age/Gender Alan Tapia. 47 y.o. male  Code Status    Code Status Orders  (From admission, onward)           Start     Ordered   04/07/23 0104  Full code  Continuous       Question:  By:  Answer:  Consent: discussion documented in EHR   04/07/23 0106           Code Status History     Date Active Date Inactive Code Status Order ID Comments User Context   04/07/2023 0056 04/07/2023 0106 Full Code [id number]  Tereasa Coop, MD ED   08/22/2022 1534 09/10/2022 2056 Full Code [id number]  Jacquelynn Cree, PA-C Inpatient   08/13/2022 1846 08/22/2022 1520 Full Code [id number]  Joseph Art, DO Inpatient   06/06/2022 2237 06/07/2022 1808 Full Code [id number]  Briscoe Deutscher, MD ED       Home/SNF/Other Home  Chief Complaint Sepsis [facility]) [A41.9]  Level of Care/Admitting Diagnosis ED Disposition     ED Disposition  Admit   Condition  --   Comment  Hospital Area: [facility] [[id number]]  Level of Care: Progressive [102]  Admit to Progressive based on following criteria: Other see comments  Comments: Sepsis  May admit patient to [facility] or [facility] if equivalent level of care is available:: No  Covid Evaluation: Asymptomatic - no recent exposure (last 10 days) testing not required  Diagnosis: Sepsis [facility]) [[id number]]  Admitting Physician: Tereasa Coop [[id number]]  Attending Physician: Tereasa Coop [[id number]]  Certification:: I certify this patient will need inpatient services for at least 2 midnights  Expected Medical Readiness: 04/11/2023          Medical History Past Medical History:  Diagnosis Date   Hypertension    Uncontrolled type 2 diabetes mellitus with hyperglycemia, with long-term current use of insulin (HCC) 06/06/2022    Allergies Allergies  Allergen Reactions   Fish Allergy Anaphylaxis, Hives and Other (See Comments)   Benadryl [Diphenhydramine] Swelling    Pt reports cannot take  pill form of benadryl   Coreg [Carvedilol] Nausea Only   Vancomycin     Redman's-type rash on 08/13/22    IV Location/Drains/Wounds Patient Lines/Drains/Airways Status     Active Line/Drains/Airways     Name Placement date Placement time Site Days   Peripheral IV 04/06/23 18 G Anterior;Distal;Left;Upper Arm 04/06/23  2208  Arm  1   Peripheral IV 04/06/23 20 G 1" Right Forearm 04/06/23  2210  Forearm  1            Labs/Imaging Results for orders placed or performed during the hospital encounter of 04/06/23 (from the past 48 hour(s))  Comprehensive metabolic panel     Status: Abnormal   Collection Time: 04/06/23 10:20 PM  Result Value Ref Range   Sodium 133 (L) 135 - 145 mmol/L   Potassium 3.5 3.5 - 5.1 mmol/L   Chloride 106 98 - 111 mmol/L   CO2 21 (L) 22 - 32 mmol/L   Glucose, Bld 113 (H) 70 - 99 mg/dL    Comment: Glucose reference range applies only to samples taken after fasting for at least 8 hours.   BUN 13 6 - 20 mg/dL   Creatinine, Ser 6.44 (H) 0.61 - 1.24 mg/dL   Calcium 6.7 (L) 8.9 - 10.3 mg/dL   Total Protein 6.4 (L) 6.5 - 8.1 g/dL   Albumin <0.3 (L) 3.5 - 5.0  g/dL   AST 17 15 - 41 U/L   ALT 13 0 - 44 U/L   Alkaline Phosphatase 299 (H) 38 - 126 U/L   Total Bilirubin 0.5 <1.2 mg/dL   GFR, Estimated >16 >10 mL/min    Comment: (NOTE) Calculated using the CKD-EPI Creatinine Equation (2021)    Anion gap 6 5 - 15    Comment: Performed at [facility], [address], [place], [name] [address]  CBC with Differential     Status: Abnormal   Collection Time: 04/06/23 10:20 PM  Result Value Ref Range   WBC 23.3 (H) 4.0 - 10.5 K/uL   RBC 3.42 (L) 4.22 - 5.81 MIL/uL   Hemoglobin 7.5 (L) 13.0 - 17.0 g/dL    Comment: Reticulocyte Hemoglobin testing may be clinically indicated, consider ordering this additional test [id number]    HCT 25.4 (L) 39.0 - 52.0 %   MCV 74.3 (L) 80.0 - 100.0 fL   MCH 21.9 (L) 26.0 - 34.0 pg   MCHC 29.5 (L) 30.0 - 36.0  g/dL   RDW 47.8 (H) 29.5 - 62.1 %   Platelets 516 (H) 150 - 400 K/uL   nRBC 0.0 0.0 - 0.2 %   Neutrophils Relative % 84 %   Neutro Abs 19.8 (H) 1.7 - 7.7 K/uL   Lymphocytes Relative 7 %   Lymphs Abs 1.5 0.7 - 4.0 K/uL   Monocytes Relative 8 %   Monocytes Absolute 1.8 (H) 0.1 - 1.0 K/uL   Eosinophils Relative 0 %   Eosinophils Absolute 0.0 0.0 - 0.5 K/uL   Basophils Relative 0 %   Basophils Absolute 0.0 0.0 - 0.1 K/uL   Immature Granulocytes 1 %   Abs Immature Granulocytes 0.22 (H) 0.00 - 0.07 K/uL    Comment: Performed at [facility], [address], [place], [name] [address]  Protime-INR     Status: Abnormal   Collection Time: 04/06/23 10:20 PM  Result Value Ref Range   Prothrombin Time 21.1 (H) 11.4 - 15.2 seconds   INR 1.8 (H) 0.8 - 1.2    Comment: (NOTE) INR goal varies based on device and disease states. Performed at [facility], [address], [place], [name] [address]   I-Stat Lactic Acid, ED     Status: Abnormal   Collection Time: 04/06/23 10:42 PM  Result Value Ref Range   Lactic Acid, Venous 2.8 (HH) 0.5 - 1.9 mmol/L   Comment NOTIFIED PHYSICIAN   POC occult blood, ED     Status: None   Collection Time: 04/06/23 10:56 PM  Result Value Ref Range   Fecal Occult Bld NEGATIVE NEGATIVE   DG Chest Port 1 View  Result Date: 04/06/2023 CLINICAL DATA:  Sepsis EXAM: PORTABLE CHEST 1 VIEW COMPARISON:  Chest x-ray 12/14/2022 FINDINGS: There is a small left pleural effusion. There are minimal patchy opacities in the left lung base. The right lung is clear. The cardiomediastinal silhouette is within normal limits. There is no pneumothorax or acute fracture. IMPRESSION: 1. Small left pleural effusion. 2. Minimal patchy opacities in the left lung base may represent atelectasis or infection. Electronically Signed   By: Darliss Cheney M.D.   On: 04/06/2023 23:38   DG Foot Complete Right  Result Date: 04/06/2023 CLINICAL DATA:  Foot  infection. EXAM: RIGHT FOOT COMPLETE - 3+ VIEW COMPARISON:  None Available. FINDINGS: There is soft tissue swelling and air in the foot surrounding the metatarsophalangeal joints. There are foreign body needle  fragments adjacent to 1 another measuring 5 mm each in the plantar soft tissues of the base of the 2/3 toes. There is no definite underlying cortical erosion in this region. There is soft tissue air, swelling and ulceration overlying the posterior and plantar aspect of the calcaneus with significant underlying osseous erosion. There is no acute fracture or dislocation IMPRESSION: 1. Soft tissue swelling and air in the foot surrounding the metatarsophalangeal joints compatible with infection. 2. Foreign body needle fragments in the plantar soft tissues of the base of the 2/3 toes. 3. Soft tissue air, swelling and ulceration overlying the posterior and plantar aspect of the calcaneus with significant underlying osseous erosion compatible with osteomyelitis. Electronically Signed   By: Darliss Cheney M.D.   On: 04/06/2023 23:33    Pending Labs Unresulted Labs (From admission, onward)     Start     Ordered   04/07/23 0800  Hemoglobin and hematocrit, blood  3 times daily,   R (with TIMED occurrences)      04/07/23 0106   04/07/23 0500  Cortisol-am, blood  Tomorrow morning,   R        04/07/23 0055   04/07/23 0500  Procalcitonin  Tomorrow morning,   R       References:    Procalcitonin Lower Respiratory Tract Infection AND Sepsis Procalcitonin Algorithm   04/07/23 0055   04/07/23 0500  Comprehensive metabolic panel  Daily,   R      04/07/23 0106   04/07/23 0500  CBC  Daily,   R      04/07/23 0106   04/07/23 0300  Lactic acid, plasma  (Lactic Acid)  STAT Now then every 3 hours,   R (with STAT occurrences)      04/07/23 0106   04/07/23 0107  Type and screen [facility]  (Blood Administration Adult)  Once,   R       Comments: [facility]    04/07/23 0106    04/07/23 0107  Prepare RBC (crossmatch)  (Blood Administration Adult)  Once,   R       Question Answer Comment  # of Units 3 units   Transfusion Indications Other   Comments Low hemoglobin 7.4.   Number of Units to Keep Ahead NO units ahead   If emergent release call blood bank Not emergent release      04/07/23 0106   04/07/23 0107  Protime-INR  Add-on,   AD        04/07/23 0106   04/07/23 0106  Occult blood card to lab, stool  Once,   R        04/07/23 0106   04/07/23 0104  Culture, blood (routine x 2) Call MD if unable to obtain prior to antibiotics being given  (COPD / Pneumonia / Cellulitis / Lower Extremity Wound)  BLOOD CULTURE X 2,   R (with TIMED occurrences)     Comments: If blood cultures drawn in Emergency Department - Do not draw and cancel order    04/07/23 0106   04/07/23 0104  Expectorated Sputum Assessment w Gram Stain, Rflx to Resp Cult  (COPD / Pneumonia / Cellulitis / Lower Extremity Wound)  Once,   R        04/07/23 0106   04/07/23 0104  Strep pneumoniae urinary antigen  (COPD / Pneumonia / Cellulitis / Lower Extremity Wound)  Once,   R        04/07/23 0106   04/07/23  0104  Legionella Pneumophila Serogp 1 Ur Ag  (COPD / Pneumonia / Cellulitis / Lower Extremity Wound)  Once,   R        04/07/23 0106   04/07/23 0104  Respiratory (~20 pathogens) panel by PCR  (Respiratory panel by PCR (~20 pathogens, ~24 hr TAT)  w precautions)  Once,   R        04/07/23 0106   04/07/23 0104  Gastrointestinal Panel by PCR , Stool  (Gastrointestinal Panel by PCR, Stool                                                                                                                                                     **Does Not include CLOSTRIDIUM DIFFICILE testing. **If CDIFF testing is needed, place order from the "C Difficile Testing" order set.**)  Once,   R        04/07/23 0106   04/07/23 0104  C Difficile Quick Screen w PCR reflex  (C Difficile quick screen w PCR reflex panel )  Once,  for 24 hours,   TIMED       References:    CDiff Information Tool   04/07/23 0106   04/06/23 2208  Culture, blood (Routine x 2)  BLOOD CULTURE X 2,   R (with STAT occurrences)      04/06/23 2208   04/06/23 2208  Urinalysis, w/ Reflex to Culture (Infection Suspected) -Urine, Clean Catch  Once,   URGENT       Question:  Specimen Source  Answer:  Urine, Clean Catch   04/06/23 2208            Vitals/Pain Today's Vitals   04/06/23 2204 04/06/23 2209 04/07/23 0030  BP:  (!) 165/79 113/68  Pulse:  (!) 117   Resp:  18   Temp: 99.7 F (37.6 C)    TempSrc: Rectal    SpO2:  96%   Weight: (!) 145.2 kg    Height: 6\' 4"  (1.93 m)      Isolation Precautions Enteric precautions (UV disinfection)  Medications Medications  enoxaparin (LOVENOX) injection 70 mg (has no administration in time range)  sodium chloride flush (NS) 0.9 % injection 3 mL (has no administration in time range)  lactated ringers bolus 1,000 mL (has no administration in time range)  doxycycline (VIBRAMYCIN) 100 mg in dextrose 5 % 250 mL IVPB (has no administration in time range)  lactated ringers infusion (has no administration in time range)  albumin human 25 % solution 50 g (has no administration in time range)  sodium chloride flush (NS) 0.9 % injection 3 mL (has no administration in time range)  sodium chloride flush (NS) 0.9 % injection 3 mL (has no administration in time range)  sodium chloride flush (NS) 0.9 % injection 3 mL (has no administration in time range)  0.9 %  sodium chloride infusion (has no administration in time range)  acetaminophen (TYLENOL) tablet 650 mg (has no administration in time range)    Or  acetaminophen (TYLENOL) suppository 650 mg (has no administration in time range)  bisacodyl (DULCOLAX) EC tablet 5 mg (has no administration in time range)  ondansetron (ZOFRAN) tablet 4 mg (has no administration in time range)    Or  ondansetron (ZOFRAN) injection 4 mg (has no administration in  time range)  0.9 %  sodium chloride infusion (Manually program via Guardrails IV Fluids) (has no administration in time range)  piperacillin-tazobactam (ZOSYN) IVPB 3.375 g (has no administration in time range)  diphenhydrAMINE (BENADRYL) injection 12.5 mg (has no administration in time range)  metroNIDAZOLE (FLAGYL) IVPB 500 mg (0 mg Intravenous Stopped 04/07/23 0032)  ondansetron (ZOFRAN) injection 4 mg (4 mg Intravenous Given 04/06/23 2340)  morphine (PF) 4 MG/ML injection 4 mg (4 mg Intravenous Given 04/06/23 2340)    Mobility non-ambulatory

## 2023-04-07 NOTE — H&P (Addendum)
History and Physical    Alan Tapia. ZOX:[id number] DOB: 08/25/1975 DOA: 04/06/2023  PCP: Patient, No Pcp Per   Patient coming from: Home   Chief Complaint: Nausea, vomiting and fever. ED TRIAGE note:  Patient BIB EMS for N/V/D uncontrollable for several days. Patient running fever for several days. Has wound left foot, large amount of foul smelling drainage to dressing. Has not been seen at wound center yet. Possible sepsis.             HPI:  Alan Tapia. is a 47 y.o. 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  Patient reported he has significant diabetic foot infection.  During my evaluation at the bedside patient reported that he has been admitted in the [facility] in August 2024 when he had left-sided foot infection and since he has been discharged from the hospital unfortunately he lost follow-up with primary care doctor as well as lost follow-up with wound care clinic as well.  He has not has got any refill of any blood pressure regimen and insulin for last 4 months.  Patient reporting that he has ongoing nausea, vomiting and diarrhea for last 1 month.  Also patient noticed right-sided heel wound with malodorous discharge for last 2 weeks.  Patient denies any foot pain.  Denies any fever and chills at home.  Denies any abdominal pain. Patient denies chest pain, orthopnea, PND, dyspnea, headache, change of appetite retroportal and constipation.   ED Course:  At presentation to ED patient is hemodynamically stable except tachycardic 117. Lactic acid 2.8. Blood cultures in  process. Elevated pro time and INR. CBC showing leukocytosis 23, hemoglobin dropped to up to 7.5 over the course of 3 months, low MCV 74 and platelet count 516. CMP showing low sodium 133, low bicarb 21, creatinine 1.4 which is around baseline, elevated alkaline phosphatase 299, low albumin 1.5 and GFR above 60. Elevated lactic acid 2.8. Chest x-ray showing left-sided pleural effusion, patchy opacity of the left lung base represent atelectasis infection.  X-ray of the left foot: IMPRESSION: 1. Soft tissue swelling and air in the foot surrounding the metatarsophalangeal joints compatible with infection. 2. Foreign body needle fragments in the plantar soft tissues of the base of the 2/3 toes. 3. Soft tissue air, swelling and ulceration overlying the posterior and plantar aspect of the calcaneus with significant underlying osseous erosion compatible with osteomyelitis.  In the ED patient has been started treating with IV cefepime,, IV metronidazole, morphine and Zofran.  Hospitalist has been contacted for further evaluation management of right-sided foot osteomyelitis and diabetic foot infection.  Significant labs in the ED: Lab Orders         Culture, blood (Routine x 2)         Culture, blood (routine x 2) Call MD if unable to obtain prior to antibiotics being given         Expectorated Sputum Assessment w Gram Stain, Rflx to Resp Cult         Respiratory (~20 pathogens) panel by PCR         Gastrointestinal Panel by PCR , Stool         C Difficile Quick Screen w PCR reflex  Comprehensive metabolic panel         CBC with Differential         Protime-INR         Urinalysis, w/ Reflex to Culture (Infection Suspected) -Urine, Clean Catch         Cortisol-am, blood         Procalcitonin         Strep pneumoniae urinary antigen         Legionella Pneumophila Serogp 1 Ur Ag         Lactic acid, plasma         Hemoglobin and hematocrit, blood         Occult blood card to lab,  stool         Comprehensive metabolic panel         CBC         Protime-INR         Sedimentation rate         C-reactive protein         Lipid panel         I-Stat Lactic Acid, ED         POC occult blood, ED       Review of Systems:  Review of Systems  Constitutional:  Positive for malaise/fatigue. Negative for chills, fever and weight loss.  Eyes:  Negative for blurred vision.  Respiratory:  Negative for cough, sputum production and shortness of breath.   Cardiovascular:  Negative for chest pain, palpitations, orthopnea and leg swelling.  Gastrointestinal:  Positive for diarrhea, nausea and vomiting. Negative for abdominal pain and heartburn.  Genitourinary:  Negative for dysuria, flank pain, frequency, hematuria and urgency.  Musculoskeletal:  Negative for back pain, falls, joint pain, myalgias and neck pain.  Neurological:  Negative for dizziness and headaches.  Psychiatric/Behavioral:  The patient is not nervous/anxious.     Past Medical History:  Diagnosis Date   Hypertension    Uncontrolled type 2 diabetes mellitus with hyperglycemia, with long-term current use of insulin (HCC) 06/06/2022    Past Surgical History:  Procedure Laterality Date   AMPUTATION Left 08/15/2022   Procedure: LEFT BELOW KNEE AMPUTATION;  Surgeon: Nadara Mustard, MD;  Location: [facility] OR;  Service: Orthopedics;  Laterality: Left;     reports that he has never smoked. He has never used smokeless tobacco. He reports that he does not drink alcohol and does not use drugs.  Allergies  Allergen Reactions   Fish Allergy Anaphylaxis, Hives and Other (See Comments)   Benadryl [Diphenhydramine] Swelling    Pt reports cannot take pill form of benadryl   Cefepime Other (See Comments)    Redness of the neck and cheek.  Concern for rash   Coreg [Carvedilol] Nausea Only   Vancomycin     Redman's-type rash on 08/13/22    Family History  Problem Relation Age of Onset   Arthritis Mother    Allergies Father     Hypertension Father    Diabetes Father     Prior to Admission medications   Medication Sig Start Date End Date Taking? Authorizing Provider  acetaminophen (TYLENOL) 325 MG tablet Take 2 tablets (650 mg total) by mouth 3 (three) times daily. 09/09/22   Love, Evlyn Kanner, PA-C  ascorbic acid (VITAMIN C) 1000 MG tablet Take 1 tablet (1,000 mg total) by mouth daily. 09/09/22   Love, Evlyn Kanner, PA-C  furosemide (LASIX) 40 MG tablet Take 1 tablet (40 mg total) by mouth  daily. 09/30/22   Fanny Dance, MD  gabapentin (NEURONTIN) 400 MG capsule Take 1 capsule (400 mg total) by mouth 3 (three) times daily. 09/09/22   Love, Evlyn Kanner, PA-C  hydrocerin (EUCERIN) CREA Apply 1 Application topically 2 (two) times daily. 09/09/22   Love, Evlyn Kanner, PA-C  insulin aspart (NOVOLOG) 100 UNIT/ML FlexPen Inject 0-11 Units into the skin 3 (three) times daily with meals. Use per sliding scale insulin 09/09/22   Love, Evlyn Kanner, PA-C  insulin glargine (SEMGLEE, YFGN,) 100 UNIT/ML Solostar Pen Inject 12 Units into the skin daily. 09/09/22   Love, Evlyn Kanner, PA-C  Insulin Pen Needle 32G X 4 MM MISC Use needles with insulin pen daily at 6 (six) AM. 09/09/22   Love, Evlyn Kanner, PA-C  melatonin 5 MG TABS Take 1 tablet (5 mg total) by mouth at bedtime. 09/09/22   Love, Evlyn Kanner, PA-C  methocarbamol (ROBAXIN) 750 MG tablet Take 1 tablet (750 mg total) by mouth every 6 (six) hours as needed for muscle spasms. 09/29/22   Fanny Dance, MD  metoprolol tartrate (LOPRESSOR) 100 MG tablet Take 1 tablet (100 mg total) by mouth 2 (two) times daily. 09/09/22   Love, Evlyn Kanner, PA-C  Multiple Vitamin (MULTIVITAMIN WITH MINERALS) TABS tablet Take 1 tablet by mouth daily. 08/23/22   Lorin Glass, MD  oxyCODONE (OXY IR/ROXICODONE) 5 MG immediate release tablet Take 0.5-1 tablets (2.5-5 mg total) by mouth daily as needed for severe pain. 09/09/22   Love, Evlyn Kanner, PA-C  polyethylene glycol powder (GLYCOLAX/MIRALAX) 17 GM/SCOOP powder Take 1 capful (17  g) by mouth daily. 09/09/22   Love, Evlyn Kanner, PA-C  spironolactone (ALDACTONE) 50 MG tablet Take 1 tablet (50 mg total) by mouth daily. 09/09/22   Love, Evlyn Kanner, PA-C  traZODone (DESYREL) 50 MG tablet Take 0.5-1 tablets (25-50 mg total) by mouth at bedtime as needed for sleep. 09/29/22   Fanny Dance, MD  zinc oxide 20 % ointment Apply topically as needed for irritation. 09/09/22   Jacquelynn Cree, PA-C     Physical Exam: Vitals:   04/07/23 0130 04/07/23 0159 04/07/23 0238 04/07/23 0249  BP:   (!) 155/77   Pulse: (!) 103  (!) 102   Resp: (!) 31  18   Temp:  98.5 F (36.9 C) 98.8 F (37.1 C)   TempSrc:  Oral Oral   SpO2: 95%  93%   Weight:      Height:    6\' 4"  (1.93 m)    Physical Exam Constitutional:      General: He is not in acute distress.    Appearance: He is obese. He is ill-appearing.  HENT:     Mouth/Throat:     Mouth: Mucous membranes are dry.  Eyes:     Conjunctiva/sclera: Conjunctivae normal.  Cardiovascular:     Rate and Rhythm: Tachycardia present.     Pulses: Normal pulses.     Heart sounds: Normal heart sounds.  Pulmonary:     Effort: Pulmonary effort is normal.     Breath sounds: Normal breath sounds.  Abdominal:     General: Bowel sounds are normal. There is no distension.     Palpations: Abdomen is soft.     Tenderness: There is no abdominal tenderness. There is no guarding.  Musculoskeletal:        General: No swelling.     Cervical back: Neck supple.     Right lower leg: No edema.     Comments: Right-sided foot  heel ulcer with black necrotic tissue.  Unstageable right heel ulcer with associated wound.  Skin:    General: Skin is dry.     Capillary Refill: Capillary refill takes less than 2 seconds.  Neurological:     Mental Status: He is oriented to person, place, and time.  Psychiatric:        Mood and Affect: Mood normal.      Labs on Admission: I have personally reviewed following labs and imaging studies  CBC: Recent Labs  Lab  04/06/23 2220  WBC 23.3*  NEUTROABS 19.8*  HGB 7.5*  HCT 25.4*  MCV 74.3*  PLT 516*   Basic Metabolic Panel: Recent Labs  Lab 04/06/23 2220  NA 133*  K 3.5  CL 106  CO2 21*  GLUCOSE 113*  BUN 13  CREATININE 1.40*  CALCIUM 6.7*   GFR: Estimated Creatinine Clearance: 101.7 mL/min (A) (by C-G formula based on SCr of 1.4 mg/dL (H)). Liver Function Tests: Recent Labs  Lab 04/06/23 2220  AST 17  ALT 13  ALKPHOS 299*  BILITOT 0.5  PROT 6.4*  ALBUMIN <1.5*   No results for input(s): "LIPASE", "AMYLASE" in the last 168 hours. No results for input(s): "AMMONIA" in the last 168 hours. Coagulation Profile: Recent Labs  Lab 04/06/23 2220  INR 1.8*   Cardiac Enzymes: No results for input(s): "CKTOTAL", "CKMB", "CKMBINDEX", "TROPONINI", "TROPONINIHS" in the last 168 hours. BNP (last 3 results) Recent Labs    12/14/22 0839  BNP 197.3*   HbA1C: No results for input(s): "HGBA1C" in the last 72 hours. CBG: No results for input(s): "GLUCAP" in the last 168 hours. Lipid Profile: No results for input(s): "CHOL", "HDL", "LDLCALC", "TRIG", "CHOLHDL", "LDLDIRECT" in the last 72 hours. Thyroid Function Tests: No results for input(s): "TSH", "T4TOTAL", "FREET4", "T3FREE", "THYROIDAB" in the last 72 hours. Anemia Panel: No results for input(s): "VITAMINB12", "FOLATE", "FERRITIN", "TIBC", "IRON", "RETICCTPCT" in the last 72 hours. Urine analysis: No results found for: "COLORURINE", "APPEARANCEUR", "LABSPEC", "PHURINE", "GLUCOSEU", "HGBUR", "BILIRUBINUR", "KETONESUR", "PROTEINUR", "UROBILINOGEN", "NITRITE", "LEUKOCYTESUR"  Radiological Exams on Admission: I have personally reviewed images DG Chest Port 1 View  Result Date: 04/06/2023 CLINICAL DATA:  Sepsis EXAM: PORTABLE CHEST 1 VIEW COMPARISON:  Chest x-ray 12/14/2022 FINDINGS: There is a small left pleural effusion. There are minimal patchy opacities in the left lung base. The right lung is clear. The cardiomediastinal  silhouette is within normal limits. There is no pneumothorax or acute fracture. IMPRESSION: 1. Small left pleural effusion. 2. Minimal patchy opacities in the left lung base may represent atelectasis or infection. Electronically Signed   By: Darliss Cheney M.D.   On: 04/06/2023 23:38   DG Foot Complete Right  Result Date: 04/06/2023 CLINICAL DATA:  Foot infection. EXAM: RIGHT FOOT COMPLETE - 3+ VIEW COMPARISON:  None Available. FINDINGS: There is soft tissue swelling and air in the foot surrounding the metatarsophalangeal joints. There are foreign body needle fragments adjacent to 1 another measuring 5 mm each in the plantar soft tissues of the base of the 2/3 toes. There is no definite underlying cortical erosion in this region. There is soft tissue air, swelling and ulceration overlying the posterior and plantar aspect of the calcaneus with significant underlying osseous erosion. There is no acute fracture or dislocation IMPRESSION: 1. Soft tissue swelling and air in the foot surrounding the metatarsophalangeal joints compatible with infection. 2. Foreign body needle fragments in the plantar soft tissues of the base of the 2/3 toes. 3. Soft tissue air, swelling  and ulceration overlying the posterior and plantar aspect of the calcaneus with significant underlying osseous erosion compatible with osteomyelitis. Electronically Signed   By: Darliss Cheney M.D.   On: 04/06/2023 23:33    EKG: Pending EKG.  Assessment/Plan: Principal Problem:   Sepsis (HCC) Active Problems:   Acute osteomyelitis of right foot (HCC)   Acute on chronic anemia   Metabolic acidosis   CAP (community acquired pneumonia)   Diarrhea   Essential hypertension   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)   Insulin dependent type 2 diabetes mellitus (HCC)   CKD (chronic kidney disease), stage II   Hx of left BKA (HCC)   PVD (peripheral vascular disease) (HCC)    Assessment and Plan: Sepsis  secondary to right-sided foot osteomyelitis Chronic right-sided foot diabetic wound > Patient presenting with right-sided foot chronic wound with malodorous discharge.  He has history of chronic right-sided foot wound/ulcer and lost follow-up with wound clinic and primary care doctor for last 4 months. -Currently not taking insulin regimen no other blood pressure medications at home. - Additionally the patient is tachycardic, tachypneic 31, hypotensive 113/64 and O2 sat 96% room air. - CBC showing leukocytosis 23.3.  Patient is afebrile.  Elevated lactic acid 2.8. - X-ray right foot showed oft tissue swelling and air in the foot surrounding the metatarsophalangeal joints compatible with infection. Foreign body needle fragments in the plantar soft tissues of the base of the 2/3 toes. . Soft tissue air, swelling and ulceration overlying the posterior and plantar aspect of the calcaneus with significant underlying osseous erosion compatible with osteomyelitis. -With the concern for right foot osteomyelitis patient has been started treating with IV cefepime and metronidazole while in the ED. 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 -In the setting of tachycardia, tachypnea, leukocytosis, elevated lactic acid and source of infection patient meets sepsis criteria. -Obtaining MRI of the right foot and right sided heel. -With the concern for osteomyelitis of the right foot plan to continue IV Zosyn with pharmacy consult and doxycycline 125 mg twice daily.  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 -Pending blood culture results. -Has history of CHF EF 35 to 40%.  Patient given 1 L of LR bolus and continue LR 100 cc/h.  Will continue to trend lactic acid level.  Once lactic acid will be improved will need to discontinue the IV fluid resuscitation. - Consulted [facility]-orthopedic surgeon Dr. Shon Baton, discussed case with him and will see patient in the morning or daytime. -Keeping patient  NPO.  Continue maintenance fluid. -Continue monitor fever curve and WBC count.  Pending lactic acid level. - Consulted inpatient wound care for right-sided diabetic wound evaluation.   Community-acquired pneumonia Right pleural effusion -Patient has leukocytosis, tachycardia, tachypnea and chest x-ray showed small left pleural effusion.  Minimal patchy opacities in the left lung base presents atelectasis and infection. - Checking sputum culture, blood culture, respiratory panel, urine Legionella and urine strep antigen. - Left-sided parapneumonic pleural effusion. - Currently on Zosyn for the management of right-sided foot osteomyelitis.  Continue IV doxycycline twice daily.  Acute on chronic anemia -Hemoglobin 7.4.  Baseline hemoglobin 12.3 months ago. - Checking FOBT. - Type and screen and checking hemoglobin 3 times daily.  Hemoglobin less than 7 need to transfuse.  Preparing 3 units of blood if patient needed  Nausea, vomiting and diarrhea -Patient reported ongoing nausea, vomiting and diarrhea for last 1 month.  Concern for gastroparesis in the setting  of DM type II.  Reported currently taking Keflex for management of right-sided foot infection?  Unable to find any record on the chart. - Given patient is immunocompromised checking GI and C. difficile panel. -Continue Zofran as needed  CKD stage II - Creatinine 1.4.  Baseline creatinine around 1.9-1.6.  GFR above 60.  No evidence of acute kidney injury at this time.  Continue to monitor renal function, urine output and avoid nephrotoxic agent.  Non-anion gap metabolic acidosis - Low bicarb 21.  Normal anion gap. - Continue to monitor.  Elevated alkaline phosphatase - Normal AST, ALT and bilirubin level.  Elevated ALP 299.  It is seems like that elevated alkaline phosphatase most likely in the setting of bone erosion from osteomyelitis rather than coming from hepatic source.  Patient does not have any abdominal pain. - Checking GGT  level.  History of DM type II - Per chart review A1c 10.3 in April 2024.  Patient not taking insulin since August 2024. -Checking A1c level.  Currently patient is n.p.o.   Combined systolic and diastolic heart failure reduced EF 35 to 40% - Patient reported not taking any medication since August 2024. -Per chart review previous medications include Lasix 40 mg daily, metoprolol 100 mg twice daily, spironolactone 50 mg daily. In the setting of sepsis and avoiding blood pressure regimen.  Will resume as appropriate.  Peripheral neuropathy -Not currently taking gabapentin.  History of left BKA -History of peripheral vascular disease and left BKA.   History of peripheral vascular disease -Currently not taking any medication. - Checking lipid panel.  DVT prophylaxis:  SCDs Code Status:  Full Code Diet: Currently n.p.o. Family Communication:   Family was present at bedside, at the time of interview. Opportunity was given to ask question and all questions were answered satisfactorily.  Disposition Plan: Pending clinical improvement and orthopedic evaluation. Consults: Orthopedics, wound care, PT and OT. Admission status:   Inpatient, progressive unit  Severity of Illness: The appropriate patient status for this patient is INPATIENT. Inpatient status is judged to be reasonable and necessary in order to provide the required intensity of service to ensure the patient's safety. The patient's presenting symptoms, physical exam findings, and initial radiographic and laboratory data in the context of their chronic comorbidities is felt to place them at high risk for further clinical deterioration. Furthermore, it is not anticipated that the patient will be medically stable for discharge from the hospital within 2 midnights of admission.   * I certify that at the point of admission it is my clinical judgment that the patient will require inpatient hospital care spanning beyond 2 midnights from the  point of admission due to high intensity of service, high risk for further deterioration and high frequency of surveillance required.Marland Kitchen    Tereasa Coop, MD [facility]  How to contact the [facility] Attending or Consulting provider 7A - 7P or covering provider during after hours 7P -7A, for this patient.  Check the care team in [facility] and look for a) attending/consulting [facility] provider listed and b) the [facility] team listed Log into [email or web address] and use [place]'s universal password to access. If you do not have the password, please contact the hospital operator. Locate the [facility] provider you are looking for under [facility] and page to a number that you can be directly reached. If you still have difficulty reaching the provider, please page the [facility] (Director on Call) for the Hospitalists listed on amion for assistance.  04/07/2023, 4:39 AM

## 2023-04-07 NOTE — Progress Notes (Addendum)
  Acute on chronic anemia secondary to bleeding from right lower foot chronic wound - Patient's hemoglobin dropped significantly 7.5-5.9 earlier in the morning. Throughout the day patient finished 2 unit of blood transfusion and 30 unit of blood currently running. -Repeat hemoglobin showing improvement 5.9 to 7.7.  Need to recheck the hemoglobin after the third unit of blood transfusion.  If it is low below 8 need to transfuse the fourth unit of blood. -No anticoagulation until hemoglobin stabilized and bleeding stopped.   Discontinued Lovenox. - Goal to keep hemoglobin at least above 8 as patient will going to have major orthopedic procedure tomorrow.  Tereasa Coop, MD [facility] 04/07/2023, 7:11 PM

## 2023-04-07 NOTE — Consult Note (Signed)
WOC consulted; significant necrotic right foot; will need surgical intervention, WOC will not consult for that reason. Orthopedic consultation pending, most likely will need amputation. History of left AKA in April of this year.    Re consult if needed, will not follow at this time. Thanks  Oval Moralez [employer], RN,CWOCN, CNS, CWON-AP [phone])

## 2023-04-07 NOTE — TOC Initial Note (Signed)
Transition of Care (TOC) - Initial/Assessment Note    Patient Details  Name: Alan Tapia. MRN: [id number] Date of Birth: March 24, 1976  Transition of Care [facility]) CM/SW Contact:    Howell Rucks, RN Phone Number: 04/07/2023, 2:36 PM  Clinical Narrative:  Met with pt spouse([name]) on unit to introduce role of TOC/NCM and review for dc planning. [name], reports pt receives care an [employer], has a pharmacy in place, reports patient was to receive home caregiver services but has not been arranged at this tme. Home DME: hospital bed, manual wheelchair, BSC ( reports is too small for pt and has been trying to obtain a larger one through pt's insurance with no success). [name] reports housing authority provided a home for them but it is not w/c accessible, inquiring if NCM would be able to assist. [name] confirmed that she has contact information for pt's Medicaid case worker, [facility] case worker and [employer] Adult Caseworker . [name] will need to follow up with Housing Authority for w/c accessible housing and Case worker (s) to request larger BSC. PT eval, await recommendation. TOC will continue to follow.   Expected Discharge Plan: Skilled Nursing Facility Barriers to Discharge: Continued Medical Work up   Patient Goals and CMS Choice Patient states their goals for this hospitalization and ongoing recovery are:: to be determined          Expected Discharge Plan and Services                                              Prior Living Arrangements/Services   Lives with:: Spouse Patient language and need for interpreter reviewed:: Yes Do you feel safe going back to the place where you live?: Yes      Need for Family Participation in Patient Care: Yes (Comment) Care giver support system in place?: Yes (comment) Current home services: DME (manual wheelchair, hospital bed, BSC) Criminal Activity/Legal Involvement Pertinent to Current  Situation/Hospitalization: No - Comment as needed  Activities of Daily Living   ADL Screening (condition at time of admission) Independently performs ADLs?: No Does the patient have a NEW difficulty with bathing/dressing/toileting/self-feeding that is expected to last >3 days?: Yes (Initiates electronic notice to provider for possible OT consult) Does the patient have a NEW difficulty with getting in/out of bed, walking, or climbing stairs that is expected to last >3 days?: Yes (Initiates electronic notice to provider for possible PT consult) Does the patient have a NEW difficulty with communication that is expected to last >3 days?: No Is the patient deaf or have difficulty hearing?: No Does the patient have difficulty seeing, even when wearing glasses/contacts?: No Does the patient have difficulty concentrating, remembering, or making decisions?: No  Permission Sought/Granted                  Emotional Assessment Appearance:: Appears stated age       Alcohol / Substance Use: Not Applicable Psych Involvement: No (comment)  Admission diagnosis:  Diabetic foot infection (HCC) [Z36.644, L08.9] Sepsis (HCC) [A41.9] Patient Active Problem List   Diagnosis Date Noted   Acute osteomyelitis of right foot (HCC) 04/07/2023   Acute on chronic anemia 04/07/2023   Metabolic acidosis 04/07/2023   CAP (community acquired pneumonia) 04/07/2023   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) 04/07/2023   Insulin dependent type  2 diabetes mellitus (HCC) 04/07/2023   CKD (chronic kidney disease), stage II 04/07/2023   Diarrhea 04/07/2023   Hx of left BKA (HCC) 04/07/2023   PVD (peripheral vascular disease) (HCC) 04/07/2023   Constipation 09/11/2022   Type 2 diabetes mellitus with hyperglycemia, with long-term current use of insulin (HCC) 09/09/2022   Depressed left ventricular ejection fraction 09/05/2022   Azotemia 09/04/2022   Phantom pain 09/02/2022    Adjustment disorder 08/29/2022   Acute systolic CHF (congestive heart failure) (HCC) 08/27/2022   Essential hypertension 08/27/2022   Dilated cardiomyopathy (HCC) 08/26/2022   Below-knee amputation of left lower extremity (HCC) 08/22/2022   Lymphedema 08/15/2022   Severe protein-calorie malnutrition (HCC) 08/15/2022   Sepsis (HCC) 08/15/2022   Subacute osteomyelitis, left ankle and foot (HCC) 08/13/2022   Hypokalemia 08/13/2022   Diabetic foot infection (HCC) 06/06/2022   Uncontrolled type 2 diabetes mellitus with hyperglycemia, with long-term current use of insulin (HCC) 06/06/2022   Hypertensive urgency 06/06/2022   PCP:  Patient, No Pcp Per Pharmacy:   [facility] [phone] - [address] [place], [place] - [address] [place] [address] AT [facility] [address] [facility] [address] [name] [address] Phone: [phone] Fax: [phone]  [id number] - [place], [place] - [address] AT [facility] [address] [name] [address] Phone: [phone] Fax: [phone]  [id number] - [address],  - [address] [name] [address] Phone: [phone] Fax: [phone]  [facility] [address] [name] [address] Phone: [phone] Fax: [phone]     Social Determinants of Health (SDOH) Social History: SDOH Screenings   Food Insecurity: No Food Insecurity (04/07/2023)  Housing: Medium Risk (04/07/2023)  Transportation Needs: No Transportation Needs (04/07/2023)  Recent Concern: Transportation Needs - Unmet Transportation Needs (02/17/2023)   Received from [facility]  Utilities: At Risk (04/07/2023)  Alcohol Screen: Low Risk  (08/20/2022)  Financial Resource Strain: Patient Unable To Answer (08/20/2022)  Tobacco Use: Low Risk  (04/07/2023)   SDOH Interventions:     Readmission Risk Interventions     04/07/2023    2:34 PM  Readmission Risk Prevention Plan  Transportation Screening Complete  PCP or Specialist Appt within 3-5 Days Complete  HRI or Home Care Consult Complete  Social Work Consult for Recovery Care Planning/Counseling Complete  Palliative Care Screening Not Applicable  Medication Review Oceanographer) Complete

## 2023-04-07 NOTE — ED Notes (Addendum)
patient cefepime is completed but bilateral rosy cheeks and right ear redness noted at this time. Provider Janalyn Shy, Eulah Pont notified. No new orders at this time.

## 2023-04-07 NOTE — Plan of Care (Signed)
  Problem: Respiratory: Goal: Ability to maintain adequate ventilation will improve Outcome: Progressing   Problem: Education: Goal: Knowledge of General Education information will improve Description: Including pain rating scale, medication(s)/side effects and non-pharmacologic comfort measures Outcome: Progressing   Problem: Safety: Goal: Ability to remain free from injury will improve Outcome: Progressing

## 2023-04-07 NOTE — Progress Notes (Signed)
PT Cancellation Note  Patient Details Name: Alan Tapia. MRN: [id number] DOB: 11/26/1975   Cancelled Treatment:    Reason Eval/Treat Not Completed: Medical issues which prohibited therapy, low HGB/ to receive  blood products, orthopedic consult pending for right foot wound.  Blanchard Kelch PT Acute Rehabilitation Services Office [phone] Weekend [phone]    Rada Hay 04/07/2023, 8:20 AM

## 2023-04-07 NOTE — Progress Notes (Addendum)
47 year old black male home dwelling Morbid obesity BMI 38 Uncontrolled DM TY 2 HTN Chronic left foot ulcer previously followed in [name] and last debrided 06/10/2022-treated previously with vancomycin via PICC line-hospitalized 4/3 through 08/22/2022-underwent L BKA---hospitalization notable new onset HF R EF 35% placed on GDMT-sent to inpatient rehab  Subsequent admission [facility] dyspnea 2/2 acute systolic heart failure-hyperkalemia treated with Lokelma-Aldactone and losartan DC but it appears might have been resumed  8/6 developed right-sided lower extremity wound malodorous purulent --hospitalized again-podiatry saw the patient--ABIs were normalized and because of needle fragment distal forefoot MRI was canceled Some reports of noncompliance for several months on medications  Patient reports to me being told that he was being managed by telehealth and they intermittently started and stopped several medications including diuretics and glycemic control  11/26 worsening right-sided foot wound with ulcer septic with tachycardia hypotension WBC 23 lactic acid 2.8 x-ray suggestive swelling air foreign body needle fragments yet again  S Tells me foot has looked this bad since the past 3 weeks (Apparently since 02/17/23 )has been on Keflex with no resolution he has also been putting calcium alginate on this area Was taking some meds periodically but had multiple telehealth visits where he was instructed to stop certain meds such as his losartan and Aldactone secondary to risk of hyperkalemia-he was started on nifedipine and Coreg as well  O/e BP (!) 155/77 (BP Location: Right Arm)   Pulse (!) 102   Temp 98.8 F (37.1 C) (Oral)   Resp 18   Ht 6\' 4"  (1.93 m)   Wt (!) 145.2 kg   SpO2 93%   BMI 38.95 kg/m  Awake coherent looks to be in some painful distress Chest is clear S1-S2 no murmur Abdomen is soft  His foot looks like this this morning and is actively bleeding   MRI canceled patient  started on Zosyn and doxycycline His foot is actively bleeding at the bedside when I saw it We are transfusing 3 units of PRBC I have spoken with Dr. Lajoyce Corners of orthopedics as well as Dr. Shon Baton and patient will need semiemergent BKA once they can stabilize his blood counts His family and him understand the high risk of sepsis with the same and he will be sent to progressive unit at [facility]  I am not really convinced he has pneumonia nor a diarrheal illness I suspect this is all in keeping with sepsis from his leg and I will discontinue stool studies etc. we can probably discontinue doxycycline tomorrow He does need IV fluid 100 cc/H I would not give any more albumin  We will give him a diabetic diet until tonight and then keep him n.p.o. while waiting for surgery  I have alerted Dr. Pola Corn  who is aware of the patient transferring over   No charge  Alan Koch, MD [facility] 10:57 AM

## 2023-04-07 NOTE — Sepsis Progress Note (Signed)
Following for sepsis monitoring ?

## 2023-04-07 NOTE — Progress Notes (Signed)
Pharmacy Antibiotic Note  Alan Tapia. is a 47 y.o. male admitted on 04/06/2023 with evaluation of fever malodorous wound in his foot. Patient has a significant diabetic foot infection. Marland Kitchen  Pharmacy has been consulted for zosyn dosing.  Plan: Zosyn 3.375g IV q8h (4 hour infusion). Follow renal function and clinical course  Height: 6\' 4"  (193 cm) Weight: (!) 145.2 kg (320 lb) IBW/kg (Calculated) : 86.8  Temp (24hrs), Avg:99.7 F (37.6 C), Min:99.7 F (37.6 C), Max:99.7 F (37.6 C)  Recent Labs  Lab 04/06/23 2220 04/06/23 2242  WBC 23.3*  --   CREATININE 1.40*  --   LATICACIDVEN  --  2.8*    Estimated Creatinine Clearance: 101.7 mL/min (A) (by C-G formula based on SCr of 1.4 mg/dL (H)).    Allergies  Allergen Reactions   Fish Allergy Anaphylaxis, Hives and Other (See Comments)   Benadryl [Diphenhydramine] Swelling    Pt reports cannot take pill form of benadryl   Coreg [Carvedilol] Nausea Only   Vancomycin     Redman's-type rash on 08/13/22     Thank you for allowing pharmacy to be a part of this patient's care.  Arley Phenix RPh 04/07/2023, 1:18 AM

## 2023-04-07 NOTE — H&P (Signed)
History:  Alan Tapia. is a 47 y.o. male with history of type 2 diabetes, CHF, BKA of left lower extremity and ongoing diabetic foot infection presenting for nausea vomiting diarrhea and right foot wound.  States the right foot wound has been there for several months.  He attributes it to taking hydrochlorothiazide.  Denies any recent trauma.  States he is being followed by 1 pill.  States he has been on Keflex for several months as well for treatment.  Also states he has been applying honey intermittently to the foot.  Nausea vomiting diarrhea started 2 weeks ago.  States his abdomen hurts all over.  Endorses fever at home as well.  States he has been taking Tylenol and ibuprofen.   Past Medical History:  Diagnosis Date   Hypertension    Uncontrolled type 2 diabetes mellitus with hyperglycemia, with long-term current use of insulin (HCC) 06/06/2022    Allergies  Allergen Reactions   Fish Allergy Anaphylaxis, Hives and Other (See Comments)   Benadryl [Diphenhydramine] Swelling    Pt reports cannot take pill form of benadryl   Cefepime Other (See Comments)    Redness of the neck and cheek.  Concern for rash   Coreg [Carvedilol] Nausea Only   Vancomycin     Redman's-type rash on 08/13/22    No current facility-administered medications on file prior to encounter.   Current Outpatient Medications on File Prior to Encounter  Medication Sig Dispense Refill   acetaminophen (TYLENOL) 325 MG tablet Take 2 tablets (650 mg total) by mouth 3 (three) times daily. 120 tablet 0   ascorbic acid (VITAMIN C) 1000 MG tablet Take 1 tablet (1,000 mg total) by mouth daily. (Patient not taking: Reported on 04/07/2023) 30 tablet 0   furosemide (LASIX) 40 MG tablet Take 1 tablet (40 mg total) by mouth daily. (Patient not taking: Reported on 04/07/2023) 30 tablet 0   gabapentin (NEURONTIN) 400 MG capsule Take 1 capsule (400 mg total) by mouth 3 (three) times daily. (Patient not taking: Reported on  04/07/2023) 90 capsule 0   hydrocerin (EUCERIN) CREA Apply 1 Application topically 2 (two) times daily. (Patient not taking: Reported on 04/07/2023) 454 g 0   insulin aspart (NOVOLOG) 100 UNIT/ML FlexPen Inject 0-11 Units into the skin 3 (three) times daily with meals. Use per sliding scale insulin (Patient not taking: Reported on 04/07/2023) 15 mL 11   insulin glargine (SEMGLEE, YFGN,) 100 UNIT/ML Solostar Pen Inject 12 Units into the skin daily. (Patient not taking: Reported on 04/07/2023) 15 mL 0   Insulin Pen Needle 32G X 4 MM MISC Use needles with insulin pen daily at 6 (six) AM. 100 each 0   melatonin 5 MG TABS Take 1 tablet (5 mg total) by mouth at bedtime. (Patient not taking: Reported on 04/07/2023) 30 tablet 0   methocarbamol (ROBAXIN) 750 MG tablet Take 1 tablet (750 mg total) by mouth every 6 (six) hours as needed for muscle spasms. (Patient not taking: Reported on 04/07/2023) 60 tablet 0   metoprolol tartrate (LOPRESSOR) 100 MG tablet Take 1 tablet (100 mg total) by mouth 2 (two) times daily. (Patient not taking: Reported on 04/07/2023) 60 tablet 0   Multiple Vitamin (MULTIVITAMIN WITH MINERALS) TABS tablet Take 1 tablet by mouth daily. (Patient not taking: Reported on 04/07/2023)     oxyCODONE (OXY IR/ROXICODONE) 5 MG immediate release tablet Take 0.5-1 tablets (2.5-5 mg total) by mouth daily as needed for severe pain. (Patient not taking:  Reported on 04/07/2023) 7 tablet 0   polyethylene glycol powder (GLYCOLAX/MIRALAX) 17 GM/SCOOP powder Take 1 capful (17 g) by mouth daily. (Patient not taking: Reported on 04/07/2023) 238 g 0   spironolactone (ALDACTONE) 50 MG tablet Take 1 tablet (50 mg total) by mouth daily. (Patient not taking: Reported on 04/07/2023) 30 tablet 0   traZODone (DESYREL) 50 MG tablet Take 0.5-1 tablets (25-50 mg total) by mouth at bedtime as needed for sleep. (Patient not taking: Reported on 04/07/2023) 15 tablet 0   zinc oxide 20 % ointment Apply topically as needed for  irritation. (Patient not taking: Reported on 04/07/2023) 56.7 g 0    Physical Exam: Vitals:   04/07/23 0159 04/07/23 0238  BP:  (!) 155/77  Pulse:  (!) 102  Resp:  18  Temp: 98.5 F (36.9 C) 98.8 F (37.1 C)  SpO2:  93%   Body mass index is 38.95 kg/m. He is alert and oriented x 3. No shortness of breath or chest pain. Abdomen soft and nontender.  No rebound tenderness.  He denies incontinence of bowel and bladder. Patient has a purulent draining right foot.  There is a chronic necrotizing ulcerations over the heel and forefoot.  Unable to palpate pulses.  Sensation is absent.  Patient has some cellulitic changes in the right calf. Patient has a left BKA amputation.  Image: DG Chest Port 1 View  Result Date: 04/06/2023 CLINICAL DATA:  Sepsis EXAM: PORTABLE CHEST 1 VIEW COMPARISON:  Chest x-ray 12/14/2022 FINDINGS: There is a small left pleural effusion. There are minimal patchy opacities in the left lung base. The right lung is clear. The cardiomediastinal silhouette is within normal limits. There is no pneumothorax or acute fracture. IMPRESSION: 1. Small left pleural effusion. 2. Minimal patchy opacities in the left lung base may represent atelectasis or infection. Electronically Signed   By: Darliss Cheney M.D.   On: 04/06/2023 23:38   DG Foot Complete Right  Result Date: 04/06/2023 CLINICAL DATA:  Foot infection. EXAM: RIGHT FOOT COMPLETE - 3+ VIEW COMPARISON:  None Available. FINDINGS: There is soft tissue swelling and air in the foot surrounding the metatarsophalangeal joints. There are foreign body needle fragments adjacent to 1 another measuring 5 mm each in the plantar soft tissues of the base of the 2/3 toes. There is no definite underlying cortical erosion in this region. There is soft tissue air, swelling and ulceration overlying the posterior and plantar aspect of the calcaneus with significant underlying osseous erosion. There is no acute fracture or dislocation IMPRESSION:  1. Soft tissue swelling and air in the foot surrounding the metatarsophalangeal joints compatible with infection. 2. Foreign body needle fragments in the plantar soft tissues of the base of the 2/3 toes. 3. Soft tissue air, swelling and ulceration overlying the posterior and plantar aspect of the calcaneus with significant underlying osseous erosion compatible with osteomyelitis. Electronically Signed   By: Darliss Cheney M.D.   On: 04/06/2023 23:33    A/P: Desean is a pleasant 47 year old morbidly obese gentleman with a chronic nonhealing right foot ulceration.  Patient has had a prior left-sided necrotizing nonhealing ulcer that resulted in a BKA amputation.  He now has similar issues on the right side.  On exam inspection the patient has necrotic tissue, foul-smelling odor, and purulent drainage.  This affects the entire foot and heel region.  Recommendations: 1.  Recommend wound care consult for dressing care. 2.  I will consult Dr. Lajoyce Corners who performed his left BKA amputation to  evaluate the patient for a possible right BKA amputation. 3.  Continue IV antibiotic therapy.  If needed ID is reasonable.  If there is any questions or concerns please not hesitate contact me.

## 2023-04-07 NOTE — Progress Notes (Signed)
OT Cancellation Note  Patient Details Name: Alan Tapia. MRN: [id number] DOB: [birth year]   Cancelled Treatment:    Reason Eval/Treat Not Completed: Medical issues which prohibited therapy. Pt is with low hemoglobin and to receive blood transfusion. Pt also pending ortho consult for RLE wound.    Reuben Likes, OTR/L 04/07/2023, 8:57 AM

## 2023-04-08 ENCOUNTER — Inpatient Hospital Stay (HOSPITAL_COMMUNITY): Payer: Medicaid Other | Admitting: Anesthesiology

## 2023-04-08 ENCOUNTER — Other Ambulatory Visit: Payer: Self-pay

## 2023-04-08 ENCOUNTER — Encounter (HOSPITAL_COMMUNITY): Payer: Self-pay | Admitting: Internal Medicine

## 2023-04-08 ENCOUNTER — Encounter (HOSPITAL_COMMUNITY)
Admission: EM | Disposition: A | Discharge status: Skilled Nursing Facility | Payer: Self-pay | Source: Home / Self Care | Attending: Family Medicine

## 2023-04-08 DIAGNOSIS — M86271 Subacute osteomyelitis, right ankle and foot: Secondary | ICD-10-CM

## 2023-04-08 DIAGNOSIS — Z794 Long term (current) use of insulin: Secondary | ICD-10-CM | POA: Diagnosis not present

## 2023-04-08 DIAGNOSIS — L02611 Cutaneous abscess of right foot: Secondary | ICD-10-CM | POA: Diagnosis not present

## 2023-04-08 DIAGNOSIS — E1152 Type 2 diabetes mellitus with diabetic peripheral angiopathy with gangrene: Secondary | ICD-10-CM | POA: Diagnosis not present

## 2023-04-08 DIAGNOSIS — M86171 Other acute osteomyelitis, right ankle and foot: Secondary | ICD-10-CM

## 2023-04-08 DIAGNOSIS — Z89511 Acquired absence of right leg below knee: Secondary | ICD-10-CM

## 2023-04-08 HISTORY — PX: AMPUTATION: SHX166

## 2023-04-08 LAB — RESPIRATORY PANEL BY PCR

## 2023-04-08 LAB — GLUCOSE, CAPILLARY
Glucose-Capillary: 120 mg/dL — ABNORMAL HIGH (ref 70–99)
Glucose-Capillary: 84 mg/dL (ref 70–99)
Glucose-Capillary: 90 mg/dL (ref 70–99)
Glucose-Capillary: 71 mg/dL (ref 70–99)
Glucose-Capillary: 70 mg/dL (ref 70–99)
Glucose-Capillary: 75 mg/dL (ref 70–99)
Glucose-Capillary: 73 mg/dL (ref 70–99)

## 2023-04-08 LAB — SURGICAL PCR SCREEN
MRSA, PCR: NEGATIVE
Staphylococcus aureus: NEGATIVE

## 2023-04-08 LAB — COMPREHENSIVE METABOLIC PANEL
ALT: 12 U/L (ref 0–44)
AST: 9 U/L — ABNORMAL LOW (ref 15–41)
Albumin: <1.5 g/dL — ABNORMAL LOW (ref 3.5–5.0)
Alkaline Phosphatase: 215 U/L — ABNORMAL HIGH (ref 38–126)
Anion gap: 8 (ref 5–15)
BUN: 12 mg/dL (ref 6–20)
CO2: 21 mmol/L — ABNORMAL LOW (ref 22–32)
Calcium: 7.2 mg/dL — ABNORMAL LOW (ref 8.9–10.3)
Chloride: 107 mmol/L (ref 98–111)
Creatinine, Ser: 1.52 mg/dL — ABNORMAL HIGH (ref 0.61–1.24)
GFR, Estimated: 57 mL/min — ABNORMAL LOW (ref >60)
Glucose, Bld: 103 mg/dL — ABNORMAL HIGH (ref 70–99)
Potassium: 3.6 mmol/L (ref 3.5–5.1)
Sodium: 136 mmol/L (ref 135–145)
Total Bilirubin: 0.8 mg/dL (ref <1.2)
Total Protein: 5.8 g/dL — ABNORMAL LOW (ref 6.5–8.1)

## 2023-04-08 LAB — CBC
HCT: 28 % — ABNORMAL LOW (ref 39.0–52.0)
Hemoglobin: 8.8 g/dL — ABNORMAL LOW (ref 13.0–17.0)
MCH: 24 pg — ABNORMAL LOW (ref 26.0–34.0)
MCHC: 31.4 g/dL (ref 30.0–36.0)
MCV: 76.3 fL — ABNORMAL LOW (ref 80.0–100.0)
Platelets: 413 10*3/uL — ABNORMAL HIGH (ref 150–400)
RBC: 3.67 MIL/uL — ABNORMAL LOW (ref 4.22–5.81)
RDW: 17.8 % — ABNORMAL HIGH (ref 11.5–15.5)
WBC: 13.9 10*3/uL — ABNORMAL HIGH (ref 4.0–10.5)
nRBC: 0 % (ref 0.0–0.2)

## 2023-04-08 LAB — PROTIME-INR
INR: 1.6 — ABNORMAL HIGH (ref 0.8–1.2)
Prothrombin Time: 19.5 s — ABNORMAL HIGH (ref 11.4–15.2)

## 2023-04-08 SURGERY — AMPUTATION BELOW KNEE
Anesthesia: General | Site: Knee | Laterality: Right

## 2023-04-08 MED ORDER — LIDOCAINE 2% (20 MG/ML) 5 ML SYRINGE
INTRAMUSCULAR | Status: AC
  Filled 2023-04-08: qty 5

## 2023-04-08 MED ORDER — ROPIVACAINE HCL 5 MG/ML IJ SOLN
INTRAMUSCULAR | Status: DC | PRN
  Administered 2023-04-08 (×8): 5 mL via PERINEURAL

## 2023-04-08 MED ORDER — ACETAMINOPHEN 325 MG PO TABS
325.0000 mg | ORAL_TABLET | Freq: Four times a day (QID) | ORAL | Status: DC | PRN
  Administered 2023-04-09 – 2023-04-12 (×5): 650 mg via ORAL
  Filled 2023-04-08 (×6): qty 2

## 2023-04-08 MED ORDER — VITAMIN C 500 MG PO TABS
1000.0000 mg | ORAL_TABLET | Freq: Every day | ORAL | Status: DC
  Administered 2023-04-08 – 2023-04-20 (×5): 1000 mg via ORAL
  Filled 2023-04-08 (×15): qty 2

## 2023-04-08 MED ORDER — FENTANYL CITRATE (PF) 100 MCG/2ML IJ SOLN: 100.0000 ug | Freq: Once | INTRAMUSCULAR | Status: AC

## 2023-04-08 MED ORDER — GUAIFENESIN-DM 100-10 MG/5ML PO SYRP
15.0000 mL | ORAL_SOLUTION | ORAL | Status: DC | PRN
  Administered 2023-04-27 – 2023-05-12 (×8): 15 mL via ORAL
  Filled 2023-04-08 (×8): qty 15

## 2023-04-08 MED ORDER — INSULIN ASPART 100 UNIT/ML IJ SOLN: 0.0000 [IU] | INTRAMUSCULAR | Status: DC | PRN

## 2023-04-08 MED ORDER — 0.9 % SODIUM CHLORIDE (POUR BTL) OPTIME
TOPICAL | Status: DC | PRN
  Administered 2023-04-08: 1000 mL

## 2023-04-08 MED ORDER — MIDAZOLAM HCL 2 MG/2ML IJ SOLN: 2.0000 mg | Freq: Once | INTRAMUSCULAR | Status: AC

## 2023-04-08 MED ORDER — MAGNESIUM SULFATE 2 GM/50ML IV SOLN: 2.0000 g | Freq: Every day | INTRAVENOUS | Status: DC | PRN

## 2023-04-08 MED ORDER — LABETALOL HCL 5 MG/ML IV SOLN
10.0000 mg | INTRAVENOUS | Status: DC | PRN
  Administered 2023-04-16: 10 mg via INTRAVENOUS
  Filled 2023-04-08: qty 4

## 2023-04-08 MED ORDER — HYDROMORPHONE HCL 1 MG/ML IJ SOLN
INTRAMUSCULAR | Status: AC
  Filled 2023-04-08: qty 1

## 2023-04-08 MED ORDER — TRANEXAMIC ACID-NACL 1000-0.7 MG/100ML-% IV SOLN
INTRAVENOUS | Status: AC
  Filled 2023-04-08: qty 100

## 2023-04-08 MED ORDER — ORAL CARE MOUTH RINSE: 15.0000 mL | Freq: Once | OROMUCOSAL | Status: AC

## 2023-04-08 MED ORDER — LEVOFLOXACIN IN D5W 500 MG/100ML IV SOLN: 500.0000 mg | INTRAVENOUS | Status: DC

## 2023-04-08 MED ORDER — FENTANYL CITRATE (PF) 250 MCG/5ML IJ SOLN
INTRAMUSCULAR | Status: DC | PRN
  Administered 2023-04-08 (×2): 25 ug via INTRAVENOUS
  Administered 2023-04-08: 50 ug via INTRAVENOUS

## 2023-04-08 MED ORDER — MEPERIDINE HCL 25 MG/ML IJ SOLN: 6.2500 mg | INTRAMUSCULAR | Status: DC | PRN

## 2023-04-08 MED ORDER — PROPOFOL 10 MG/ML IV BOLUS
INTRAVENOUS | Status: DC | PRN
  Administered 2023-04-08: 200 mg via INTRAVENOUS

## 2023-04-08 MED ORDER — CHLORHEXIDINE GLUCONATE 0.12 % MT SOLN: 15.0000 mL | Freq: Once | OROMUCOSAL | Status: AC

## 2023-04-08 MED ORDER — DEXAMETHASONE SODIUM PHOSPHATE 10 MG/ML IJ SOLN
INTRAMUSCULAR | Status: AC
  Filled 2023-04-08: qty 1

## 2023-04-08 MED ORDER — OXYCODONE HCL 5 MG PO TABS
5.0000 mg | ORAL_TABLET | ORAL | Status: DC | PRN
  Administered 2023-04-09 – 2023-04-12 (×3): 10 mg via ORAL
  Administered 2023-04-12 – 2023-04-16 (×6): 5 mg via ORAL
  Administered 2023-04-18 – 2023-04-19 (×2): 10 mg via ORAL
  Administered 2023-04-20: 5 mg via ORAL
  Administered 2023-04-20 (×2): 10 mg via ORAL
  Administered 2023-04-21: 5 mg via ORAL
  Administered 2023-04-25 – 2023-05-14 (×7): 10 mg via ORAL
  Administered 2023-05-23: 5 mg via ORAL
  Administered 2023-05-26 – 2023-05-28 (×3): 10 mg via ORAL
  Administered 2023-05-28: 5 mg via ORAL
  Administered 2023-06-10 – 2023-06-13 (×2): 10 mg via ORAL
  Filled 2023-04-08: qty 1
  Filled 2023-04-08: qty 2
  Filled 2023-04-08: qty 1
  Filled 2023-04-08: qty 2
  Filled 2023-04-08: qty 1
  Filled 2023-04-08 (×3): qty 2
  Filled 2023-04-08: qty 1
  Filled 2023-04-08: qty 2
  Filled 2023-04-08: qty 1
  Filled 2023-04-08 (×12): qty 2
  Filled 2023-04-08 (×2): qty 1
  Filled 2023-04-08 (×3): qty 2
  Filled 2023-04-08: qty 1
  Filled 2023-04-08 (×4): qty 2
  Filled 2023-04-08: qty 1
  Filled 2023-04-08: qty 2
  Filled 2023-04-08: qty 1
  Filled 2023-04-08 (×2): qty 2
  Filled 2023-04-08: qty 1
  Filled 2023-04-08 (×2): qty 2
  Filled 2023-04-08: qty 1

## 2023-04-08 MED ORDER — HYDROMORPHONE HCL 1 MG/ML IJ SOLN
0.2500 mg | INTRAMUSCULAR | Status: DC | PRN
  Administered 2023-04-08 (×4): 0.5 mg via INTRAVENOUS

## 2023-04-08 MED ORDER — TRANEXAMIC ACID-NACL 1000-0.7 MG/100ML-% IV SOLN
1000.0000 mg | INTRAVENOUS | Status: AC
  Administered 2023-04-08: 1000 mg via INTRAVENOUS

## 2023-04-08 MED ORDER — HYDRALAZINE HCL 20 MG/ML IJ SOLN: 5.0000 mg | INTRAMUSCULAR | Status: DC | PRN

## 2023-04-08 MED ORDER — PHENOL 1.4 % MT LIQD: 1.0000 | OROMUCOSAL | Status: DC | PRN

## 2023-04-08 MED ORDER — LIDOCAINE 2% (20 MG/ML) 5 ML SYRINGE
INTRAMUSCULAR | Status: DC | PRN
  Administered 2023-04-08: 40 mg via INTRAVENOUS

## 2023-04-08 MED ORDER — POTASSIUM CHLORIDE CRYS ER 20 MEQ PO TBCR: 20.0000 meq | EXTENDED_RELEASE_TABLET | Freq: Every day | ORAL | Status: DC | PRN

## 2023-04-08 MED ORDER — VANCOMYCIN HCL 1500 MG/300ML IV SOLN
1500.0000 mg | INTRAVENOUS | Status: AC
  Administered 2023-04-08: 1500 mg via INTRAVENOUS

## 2023-04-08 MED ORDER — VANCOMYCIN HCL 1500 MG/300ML IV SOLN
INTRAVENOUS | Status: AC
  Filled 2023-04-08: qty 300

## 2023-04-08 MED ORDER — JUVEN PO PACK
1.0000 | PACK | Freq: Two times a day (BID) | ORAL | Status: DC
  Administered 2023-04-09 – 2023-04-11 (×3): 1 via ORAL
  Filled 2023-04-08 (×6): qty 1

## 2023-04-08 MED ORDER — SODIUM CHLORIDE 0.9 % IV SOLN: INTRAVENOUS | Status: AC

## 2023-04-08 MED ORDER — SODIUM CHLORIDE 0.9 % IV SOLN
INTRAVENOUS | Status: DC
Start: 2023-04-08 — End: 2023-04-12

## 2023-04-08 MED ORDER — PROPOFOL 10 MG/ML IV BOLUS
INTRAVENOUS | Status: AC
  Filled 2023-04-08: qty 20

## 2023-04-08 MED ORDER — HYDROMORPHONE HCL 1 MG/ML IJ SOLN
0.5000 mg | INTRAMUSCULAR | Status: DC | PRN
  Administered 2023-04-08 – 2023-04-09 (×4): 0.5 mg via INTRAVENOUS
  Filled 2023-04-08: qty 0.5
  Filled 2023-04-08: qty 1
  Filled 2023-04-08 (×2): qty 0.5

## 2023-04-08 MED ORDER — FENTANYL CITRATE (PF) 100 MCG/2ML IJ SOLN
INTRAMUSCULAR | Status: AC
  Administered 2023-04-08: 100 ug via INTRAVENOUS
  Filled 2023-04-08: qty 2

## 2023-04-08 MED ORDER — ONDANSETRON HCL 4 MG/2ML IJ SOLN
INTRAMUSCULAR | Status: DC | PRN
  Administered 2023-04-08: 4 mg via INTRAVENOUS

## 2023-04-08 MED ORDER — ONDANSETRON HCL 4 MG/2ML IJ SOLN
INTRAMUSCULAR | Status: AC
  Filled 2023-04-08: qty 2

## 2023-04-08 MED ORDER — FENTANYL CITRATE (PF) 250 MCG/5ML IJ SOLN
INTRAMUSCULAR | Status: AC
  Filled 2023-04-08: qty 5

## 2023-04-08 MED ORDER — METOPROLOL TARTRATE 5 MG/5ML IV SOLN: 2.0000 mg | INTRAVENOUS | Status: DC | PRN

## 2023-04-08 MED ORDER — VASHE WOUND IRRIGATION OPTIME
TOPICAL | Status: DC | PRN
  Administered 2023-04-08: 34 [oz_av]

## 2023-04-08 MED ORDER — ZINC SULFATE 220 (50 ZN) MG PO CAPS
220.0000 mg | ORAL_CAPSULE | Freq: Every day | ORAL | Status: AC
  Administered 2023-04-08 – 2023-04-21 (×11): 220 mg via ORAL
  Filled 2023-04-08 (×12): qty 1

## 2023-04-08 MED ORDER — CHLORHEXIDINE GLUCONATE 0.12 % MT SOLN
OROMUCOSAL | Status: AC
  Administered 2023-04-08: 15 mL via OROMUCOSAL
  Filled 2023-04-08: qty 15

## 2023-04-08 MED ORDER — ACETAMINOPHEN 10 MG/ML IV SOLN
1000.0000 mg | Freq: Once | INTRAVENOUS | Status: DC | PRN
  Administered 2023-04-08: 1000 mg via INTRAVENOUS

## 2023-04-08 MED ORDER — ACETAMINOPHEN 10 MG/ML IV SOLN
INTRAVENOUS | Status: AC
  Filled 2023-04-08: qty 100

## 2023-04-08 MED ORDER — MIDAZOLAM HCL 2 MG/2ML IJ SOLN
INTRAMUSCULAR | Status: AC
  Administered 2023-04-08: 2 mg via INTRAVENOUS
  Filled 2023-04-08: qty 2

## 2023-04-08 MED ORDER — OXYCODONE HCL 5 MG PO TABS
10.0000 mg | ORAL_TABLET | ORAL | Status: DC | PRN
Start: 2023-04-08 — End: 2023-06-25
  Administered 2023-04-09 (×2): 15 mg via ORAL
  Administered 2023-04-10 (×3): 10 mg via ORAL
  Administered 2023-04-11 – 2023-04-12 (×4): 15 mg via ORAL
  Administered 2023-04-17 (×2): 10 mg via ORAL
  Administered 2023-04-18: 15 mg via ORAL
  Administered 2023-04-19 – 2023-04-21 (×3): 10 mg via ORAL
  Administered 2023-04-22 – 2023-05-02 (×30): 15 mg via ORAL
  Administered 2023-05-03: 10 mg via ORAL
  Administered 2023-05-03 – 2023-05-14 (×40): 15 mg via ORAL
  Administered 2023-05-15 (×3): 10 mg via ORAL
  Administered 2023-05-16: 15 mg via ORAL
  Administered 2023-05-16: 10 mg via ORAL
  Administered 2023-05-16 – 2023-05-20 (×19): 15 mg via ORAL
  Administered 2023-05-20: 10 mg via ORAL
  Administered 2023-05-21 – 2023-05-27 (×24): 15 mg via ORAL
  Administered 2023-05-28 (×3): 10 mg via ORAL
  Administered 2023-05-29 – 2023-06-13 (×69): 15 mg via ORAL
  Administered 2023-06-13: 10 mg via ORAL
  Administered 2023-06-14 – 2023-06-15 (×3): 15 mg via ORAL
  Administered 2023-06-15 (×2): 10 mg via ORAL
  Administered 2023-06-16 – 2023-06-18 (×6): 15 mg via ORAL
  Administered 2023-06-18: 10 mg via ORAL
  Administered 2023-06-19 – 2023-06-24 (×18): 15 mg via ORAL
  Filled 2023-04-08 (×2): qty 3
  Filled 2023-04-08: qty 2
  Filled 2023-04-08 (×5): qty 3
  Filled 2023-04-08: qty 2
  Filled 2023-04-08 (×29): qty 3
  Filled 2023-04-08: qty 2
  Filled 2023-04-08 (×15): qty 3
  Filled 2023-04-08: qty 2
  Filled 2023-04-08 (×13): qty 3
  Filled 2023-04-08 (×2): qty 2
  Filled 2023-04-08 (×22): qty 3
  Filled 2023-04-08: qty 2
  Filled 2023-04-08 (×19): qty 3
  Filled 2023-04-08: qty 2
  Filled 2023-04-08 (×54): qty 3
  Filled 2023-04-08: qty 2
  Filled 2023-04-08 (×21): qty 3
  Filled 2023-04-08: qty 2
  Filled 2023-04-08 (×15): qty 3
  Filled 2023-04-08: qty 2
  Filled 2023-04-08 (×28): qty 3

## 2023-04-08 MED ORDER — POVIDONE-IODINE 10 % EX SWAB
2.0000 | Freq: Once | CUTANEOUS | Status: AC
  Administered 2023-04-08: 2 via TOPICAL

## 2023-04-08 MED ORDER — SODIUM CHLORIDE 0.9 % IV SOLN: 12.5000 mg | INTRAVENOUS | Status: DC | PRN

## 2023-04-08 MED ORDER — LACTATED RINGERS IV SOLN: INTRAVENOUS | Status: DC

## 2023-04-08 SURGICAL SUPPLY — 33 items
BAG COUNTER SPONGE SURGICOUNT (BAG) IMPLANT
BLADE SAW RECIP 87.9 MT (BLADE) ×1 IMPLANT
BLADE SURG 21 STRL SS (BLADE) ×1 IMPLANT
BNDG COHESIVE 6X5 TAN ST LF (GAUZE/BANDAGES/DRESSINGS) IMPLANT
CANISTER WOUND CARE 500ML ATS (WOUND CARE) ×1 IMPLANT
COVER SURGICAL LIGHT HANDLE (MISCELLANEOUS) ×1 IMPLANT
CUFF TRNQT CYL 34X4.125X (TOURNIQUET CUFF) ×1 IMPLANT
DRAPE INCISE IOBAN 66X45 STRL (DRAPES) ×1 IMPLANT
DRAPE U-SHAPE 47X51 STRL (DRAPES) ×1 IMPLANT
DRESSING PREVENA PLUS CUSTOM (GAUZE/BANDAGES/DRESSINGS) ×1 IMPLANT
DRSG PREVENA PLUS CUSTOM (GAUZE/BANDAGES/DRESSINGS) ×1 IMPLANT
DURAPREP 26ML APPLICATOR (WOUND CARE) ×1 IMPLANT
ELECT REM PT RETURN 9FT ADLT (ELECTROSURGICAL) ×1 IMPLANT
ELECTRODE REM PT RTRN 9FT ADLT (ELECTROSURGICAL) ×1 IMPLANT
GLOVE BIOGEL PI IND STRL 9 (GLOVE) ×1 IMPLANT
GLOVE SURG ORTHO 9.0 STRL STRW (GLOVE) ×1 IMPLANT
GOWN STRL REUS W/ TWL XL LVL3 (GOWN DISPOSABLE) ×2 IMPLANT
KIT BASIN OR (CUSTOM PROCEDURE TRAY) ×1 IMPLANT
KIT TURNOVER KIT B (KITS) ×1 IMPLANT
MANIFOLD NEPTUNE II (INSTRUMENTS) ×1 IMPLANT
NS IRRIG 1000ML POUR BTL (IV SOLUTION) ×1 IMPLANT
PACK ORTHO EXTREMITY (CUSTOM PROCEDURE TRAY) ×1 IMPLANT
PAD ARMBOARD 7.5X6 YLW CONV (MISCELLANEOUS) ×1 IMPLANT
PREVENA RESTOR ARTHOFORM 46X30 (CANNISTER) ×1 IMPLANT
SPONGE T-LAP 18X18 ~~LOC~~+RFID (SPONGE) IMPLANT
STAPLER VISISTAT 35W (STAPLE) IMPLANT
STOCKINETTE IMPERVIOUS LG (DRAPES) ×1 IMPLANT
SUT ETHILON 2 0 PSLX (SUTURE) IMPLANT
SUT SILK 2-0 18XBRD TIE 12 (SUTURE) ×1 IMPLANT
SUT VIC AB 1 CTX 27 (SUTURE) ×2 IMPLANT
TOWEL GREEN STERILE (TOWEL DISPOSABLE) ×1 IMPLANT
TUBE CONNECTING 12X1/4 (SUCTIONS) ×1 IMPLANT
YANKAUER SUCT BULB TIP NO VENT (SUCTIONS) ×1 IMPLANT

## 2023-04-08 NOTE — Progress Notes (Signed)
Patient received from PACU at 1625, pt is alert and oriented *4, V/S monitored, denied surgical pain, CCMD notified, Call bell in reach  04/08/23 1639  Vitals  Temp 98.2 F (36.8 C)  Temp Source Oral  BP (!) 140/71  MAP (mmHg) 91  BP Location Right Arm  BP Method Automatic  Patient Position (if appropriate) Lying  Pulse Rate 96  Pulse Rate Source Monitor  ECG Heart Rate 96  Resp 18  Level of Consciousness  Level of Consciousness Alert  MEWS COLOR  MEWS Score Color Green  Oxygen Therapy  SpO2 100 %  O2 Device Nasal Cannula  O2 Flow Rate (L/min) 1 L/min  Pain Assessment  Pain Scale 0-10  Pain Score 0  MEWS Score  MEWS Temp 0  MEWS Systolic 0  MEWS Pulse 0  MEWS RR 0  MEWS LOC 0  MEWS Score 0

## 2023-04-08 NOTE — Anesthesia Procedure Notes (Signed)
Anesthesia Regional Block: Popliteal block   Pre-Anesthetic Checklist: , timeout performed,  Correct Patient, Correct Site, Correct Laterality,  Correct Procedure, Correct Position, site marked,  Risks and benefits discussed,  Surgical consent,  Pre-op evaluation,  At surgeon's request and post-op pain management  Laterality: Lower and Right  Prep: chloraprep       Needles:  Injection technique: Single-shot  Needle Type: Echogenic Stimulator Needle     Needle Length: 9cm  Needle Gauge: 20   Needle insertion depth: 3 cm   Additional Needles:   Procedures:,,,, ultrasound used (permanent image in chart),,    Narrative:  Start time: 04/08/2023 2:18 PM End time: 04/08/2023 2:25 PM Injection made incrementally with aspirations every 5 mL.  Performed by: Personally  Anesthesiologist: Leilani Able, MD

## 2023-04-08 NOTE — Consult Note (Signed)
ORTHOPAEDIC CONSULTATION  REQUESTING PHYSICIAN: Susa Griffins, MD  Chief Complaint: Necrotic abscess right foot.  HPI: Alan Tapia. is a 47 y.o. 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  Past Medical History:  Diagnosis Date   Hypertension    Uncontrolled type 2 diabetes mellitus with hyperglycemia, with long-term current use of insulin (HCC) 06/06/2022   Past Surgical History:  Procedure Laterality Date   AMPUTATION Left 08/15/2022   Procedure: LEFT BELOW KNEE AMPUTATION;  Surgeon: Nadara Mustard, MD;  Location: [facility] OR;  Service: Orthopedics;  Laterality: Left;   Social History   Socioeconomic History   Marital status: Married    Spouse name: [name]   Number of children: 0   Years of education: Not on file   Highest education level: Bachelor's degree (e.g., BA, AB, BS)  Occupational History    Comment: Just moved to [place] from [name]  Tobacco Use   Smoking status: Never   Smokeless tobacco: Never  Vaping Use   Vaping status: Never Used  Substance and Sexual Activity   Alcohol use: No   Drug use: No   Sexual activity: Not on file  Other Topics Concern   Not on file  Social History Narrative   Not on file   Social Determinants of Health   Financial Resource Strain: Patient Unable To Answer (08/20/2022)   Overall Financial Resource Strain (CARDIA)    Difficulty of Paying Living Expenses: Patient unable to answer  Food Insecurity: No Food Insecurity (04/07/2023)   Hunger Vital Sign    Worried About Running Out of Food in the Last Year: Never true    Ran Out of Food in the Last Year: Never true  Transportation Needs: No Transportation Needs (04/07/2023)   PRAPARE - Administrator, Civil Service (Medical): No    Lack of Transportation (Non-Medical): No  Recent Concern: Transportation Needs - Unmet Transportation Needs (02/17/2023)    Received from [employer]    In the past 12 months, has lack of reliable transportation kept you from medical appointments, meetings, work or from getting things needed for daily living? : Yes  Physical Activity: Not on file  Stress: Not on file  Social Connections: Not on file   Family History  Problem Relation Age of Onset   Arthritis Mother    Allergies Father    Hypertension Father    Diabetes Father    - negative except otherwise stated in the family history section Allergies  Allergen Reactions   Fish Allergy Anaphylaxis, Hives and Other (See Comments)   Benadryl [Diphenhydramine] Swelling    Pt reports cannot take pill form of benadryl   Cefepime Other (See Comments)    Redness of the neck and cheek.  Concern for rash   Coreg [Carvedilol] Nausea Only   Vancomycin Hives    Redman's-type rash on 08/13/22   Prior to Admission medications   Medication Sig Start Date End Date Taking? Authorizing Provider  acetaminophen (TYLENOL) 325 MG tablet Take 2 tablets (650 mg total) by mouth 3 (three) times daily. 09/09/22  Yes Love, Evlyn Kanner, PA-C  ascorbic acid (VITAMIN C) 1000 MG tablet Take 1 tablet (1,000 mg total) by mouth daily. Patient not taking: Reported on 04/07/2023 09/09/22   Love, Evlyn Kanner, PA-C  furosemide (LASIX) 40 MG tablet Take 1 tablet (40 mg total) by mouth daily. Patient not taking: Reported  on 04/07/2023 09/30/22   Fanny Dance, MD  gabapentin (NEURONTIN) 400 MG capsule Take 1 capsule (400 mg total) by mouth 3 (three) times daily. Patient not taking: Reported on 04/07/2023 09/09/22   Love, Evlyn Kanner, PA-C  hydrocerin (EUCERIN) CREA Apply 1 Application topically 2 (two) times daily. Patient not taking: Reported on 04/07/2023 09/09/22   Love, Evlyn Kanner, PA-C  insulin aspart (NOVOLOG) 100 UNIT/ML FlexPen Inject 0-11 Units into the skin 3 (three) times daily with meals. Use per sliding scale insulin Patient not taking: Reported on 04/07/2023 09/09/22    Love, Evlyn Kanner, PA-C  insulin glargine (SEMGLEE, YFGN,) 100 UNIT/ML Solostar Pen Inject 12 Units into the skin daily. Patient not taking: Reported on 04/07/2023 09/09/22   Love, Evlyn Kanner, PA-C  Insulin Pen Needle 32G X 4 MM MISC Use needles with insulin pen daily at 6 (six) AM. 09/09/22   Love, Evlyn Kanner, PA-C  melatonin 5 MG TABS Take 1 tablet (5 mg total) by mouth at bedtime. Patient not taking: Reported on 04/07/2023 09/09/22   Love, Evlyn Kanner, PA-C  methocarbamol (ROBAXIN) 750 MG tablet Take 1 tablet (750 mg total) by mouth every 6 (six) hours as needed for muscle spasms. Patient not taking: Reported on 04/07/2023 09/29/22   Fanny Dance, MD  metoprolol tartrate (LOPRESSOR) 100 MG tablet Take 1 tablet (100 mg total) by mouth 2 (two) times daily. Patient not taking: Reported on 04/07/2023 09/09/22   Love, Evlyn Kanner, PA-C  Multiple Vitamin (MULTIVITAMIN WITH MINERALS) TABS tablet Take 1 tablet by mouth daily. Patient not taking: Reported on 04/07/2023 08/23/22   Lorin Glass, MD  oxyCODONE (OXY IR/ROXICODONE) 5 MG immediate release tablet Take 0.5-1 tablets (2.5-5 mg total) by mouth daily as needed for severe pain. Patient not taking: Reported on 04/07/2023 09/09/22   Love, Evlyn Kanner, PA-C  polyethylene glycol powder (GLYCOLAX/MIRALAX) 17 GM/SCOOP powder Take 1 capful (17 g) by mouth daily. Patient not taking: Reported on 04/07/2023 09/09/22   Love, Evlyn Kanner, PA-C  spironolactone (ALDACTONE) 50 MG tablet Take 1 tablet (50 mg total) by mouth daily. Patient not taking: Reported on 04/07/2023 09/09/22   Love, Evlyn Kanner, PA-C  traZODone (DESYREL) 50 MG tablet Take 0.5-1 tablets (25-50 mg total) by mouth at bedtime as needed for sleep. Patient not taking: Reported on 04/07/2023 09/29/22   Fanny Dance, MD  zinc oxide 20 % ointment Apply topically as needed for irritation. Patient not taking: Reported on 04/07/2023 09/09/22   Jerene Pitch   DG Chest Port 1 View  Result Date:  04/06/2023 CLINICAL DATA:  Sepsis EXAM: PORTABLE CHEST 1 VIEW COMPARISON:  Chest x-ray 12/14/2022 FINDINGS: There is a small left pleural effusion. There are minimal patchy opacities in the left lung base. The right lung is clear. The cardiomediastinal silhouette is within normal limits. There is no pneumothorax or acute fracture. IMPRESSION: 1. Small left pleural effusion. 2. Minimal patchy opacities in the left lung base may represent atelectasis or infection. Electronically Signed   By: Darliss Cheney M.D.   On: 04/06/2023 23:38   DG Foot Complete Right  Result Date: 04/06/2023 CLINICAL DATA:  Foot infection. EXAM: RIGHT FOOT COMPLETE - 3+ VIEW COMPARISON:  None Available. FINDINGS: There is soft tissue swelling and air in the foot surrounding the metatarsophalangeal joints. There are foreign body needle fragments adjacent to 1 another measuring 5 mm each in the plantar soft tissues of the base of the 2/3 toes. There is no definite underlying cortical erosion in  this region. There is soft tissue air, swelling and ulceration overlying the posterior and plantar aspect of the calcaneus with significant underlying osseous erosion. There is no acute fracture or dislocation IMPRESSION: 1. Soft tissue swelling and air in the foot surrounding the metatarsophalangeal joints compatible with infection. 2. Foreign body needle fragments in the plantar soft tissues of the base of the 2/3 toes. 3. Soft tissue air, swelling and ulceration overlying the posterior and plantar aspect of the calcaneus with significant underlying osseous erosion compatible with osteomyelitis. Electronically Signed   By: Darliss Cheney M.D.   On: 04/06/2023 23:33   - pertinent xrays, CT, MRI studies were reviewed and independently interpreted  Positive ROS: All other systems have been reviewed and were otherwise negative with the exception of those mentioned in the HPI and as above.  Physical Exam: General: Alert, no acute  distress Psychiatric: Patient is competent for consent with normal mood and affect Lymphatic: No axillary or cervical lymphadenopathy Cardiovascular: No pedal edema Respiratory: No cyanosis, no use of accessory musculature GI: No organomegaly, abdomen is soft and non-tender    Images:  @ENCIMAGES @  Labs:  Lab Results  Component Value Date   HGBA1C 6.8 (H) 04/07/2023   HGBA1C 10.3 (H) 08/13/2022   HGBA1C 12.3 (H) 06/07/2022   ESRSEDRATE 100 (H) 04/07/2023   ESRSEDRATE 107 (H) 08/13/2022   ESRSEDRATE 119 (H) 06/07/2022   CRP 18.2 (H) 04/07/2023   CRP 10.5 (H) 06/07/2022   REPTSTATUS PENDING 04/07/2023   REPTSTATUS PENDING 04/07/2023   CULT PENDING 04/07/2023   CULT PENDING 04/07/2023    Lab Results  Component Value Date   ALBUMIN <1.5 (L) 04/07/2023   ALBUMIN <1.5 (L) 04/06/2023   ALBUMIN 1.6 (L) 12/14/2022   PREALBUMIN <5 (L) 04/07/2023   PREALBUMIN 8 (L) 08/13/2022        Latest Ref Rng & Units 04/07/2023   10:35 PM 04/07/2023    7:30 PM 04/07/2023    8:42 AM  CBC EXTENDED  Hemoglobin 13.0 - 17.0 g/dL 8.5  7.7  5.9   HCT 14.7 - 52.0 % 27.7  25.1  20.5     Neurologic: Patient does not have protective sensation bilateral lower extremities.   MUSCULOSKELETAL:   Skin: Examination there is a purulent draining abscess involving the midfoot and forefoot on the right.  No ascending cellulitis into the calf.  Review of the radiographs shows air in the soft tissue involving the forefoot midfoot and hindfoot. Patient has a large soft tissue defect around the calcaneus with exposed bone.  Patient is status post previous foot salvage intervention with partial calcaneal excision.  Hemoglobin 8.5 yesterday with hemoglobin pending this morning.  Albumin less than 1.5 with a hemoglobin A1c 6.8 previous A1c's studies this year 12.3 and 10.3.  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  Plan: Patient does not have foot salvage intervention options.  Plan for right transtibial amputation today.  Anticipate patient will need discharge to skilled nursing.  Thank you for the consult and the opportunity to see Mr. Frann Rider, MD [facility] [phone] 7:13 AM

## 2023-04-08 NOTE — Progress Notes (Signed)
[facility] PROGRESS NOTE  Alan Tapia. BJY:[id number] DOB: 1975/07/04 DOA: 04/06/2023 PCP: Patient, No Pcp Per  Status is: Inpatient Remains inpatient appropriate because: Necrotic.,  Surgery  Barriers to discharge: Social: Bilateral lung congestion  Clinical:  Level of care: Progressive   Code Status: Full code Family Communication: Wife at bedside DVT prophylaxis:  COVID vaccination status:  Foley catheter: POA: Date inserted:   Subjective: Complaining of pain in the foot  Objective: Vitals:   04/08/23 0749 04/08/23 1148  BP: (!) 153/77 (!) 150/77  Pulse: 91   Resp: 20   Temp: 97.8 F (36.6 C) 98.3 F (36.8 C)  SpO2: 99%     Intake/Output Summary (Last 24 hours) at 04/08/2023 1250 Last data filed at 04/08/2023 0500 Gross per 24 hour  Intake 4015.58 ml  Output 900 ml  Net 3115.58 ml   Filed Weights   04/07/23 2119 04/08/23 0500 04/08/23 1244  Weight: (!) 144.8 kg (!) 142.9 kg (!) 142.9 kg    Exam:  Constitutional: NAD, calm, comfortable Eyes: PERRL, lids and conjunctivae normal ENMT: Mucous membranes are moist. Posterior pharynx clear of any exudate or lesions.Normal dentition.  Neck: normal, supple, no masses, no thyromegaly Respiratory: clear to auscultation bilaterally, no wheezing, no crackles. Normal respiratory effort. No accessory muscle use.  Cardiovascular: Regular rate and rhythm, no murmurs / rubs / gallops. No extremity edema. 2+ pedal pulses. No carotid bruits.  Abdomen: no tenderness, no masses palpated. No hepatosplenomegaly. Bowel sounds positive.  Musculoskeletal: no clubbing / cyanosis. No joint deformity upper and lower extremities. Good ROM, no contractures. Normal muscle tone.  Skin: no rashes, lesions, ulcers. No induration Neurologic: CN 2-12 grossly intact. Sensation intact, DTR normal. Strength 5/5 x all 4 extremities.  Psychiatric: Normal judgment and insight. Alert and oriented x 3. Normal mood.     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  Patient was admitted to [facility] in August 2020 for when had left-sided foot infection since then lost follow-up with the primary care physician, last follow-up with wound care clinic.  Also has not been receiving his blood pressure medications or insulin for the last 4 months.  Patient comes to the emergency department with complaints of nausea, vomiting, diarrhea for the last 1 month.  Orthopedic surgery is consulted.  Going for amputation of the right leg BKA.  Patient is started on broad-spectrum antibiotics with Zosyn, vancomycin.  Patient was found to have low hemoglobin of 5.9, transfused 4 units of packed RBC with improvement of the hemoglobin to 8.8.  Nonhealing right foot ulcer with necrotic -Going for right BKA  Sepsis secondary to necrotic right foot ulcers, present on admission -Continue rectal vancomycin, cefepime -Admitted with a WBC count of 23,000, tachycardia, elevated lactic acid of 2.8  Anemia multifactorial -No anemia workup on discharge -Will get the anemia workup in 1 week as patient received 4 units of PRBC -Get stool occult -Closely follow-up with CBC.  Diabetes mellitus poorly controlled with hyperglycemia -Get hemoglobin A1c -Continue with Lantus 10 units daily -Follow-up on sliding scale insulin.  Severe protein calorie malnutrition -Patient has albumin of less than 1.5 -Prealbumin level.  Acute on chronic kidney failure stage IIIa -Secondary to poorly  controlled diabetes mellitus, noncompliance with medications for  hypertension. -Get urinalysis to rule out any infection -Check CPK level  Peripheral vascular disease -S/p left BKA, going for right BKA  Hypertension accelerated -Keep her on Coreg 25 mg twice daily Data Reviewed: Basic Metabolic Panel: Recent Labs  Lab 04/06/23 2220 04/07/23 0540  NA 133* 135  K 3.5 3.5  CL 106 107  CO2 21* 21*  GLUCOSE 113* 106*  BUN 13 13  CREATININE 1.40* 1.38*  CALCIUM 6.7* 6.9*   Liver Function Tests: Recent Labs  Lab 04/06/23 2220 04/07/23 0540  AST 17 12*  ALT 13 10  ALKPHOS 299* 261*  BILITOT 0.5 0.7  PROT 6.4* 5.9*  ALBUMIN <1.5* <1.5*   No results for input(s): "LIPASE", "AMYLASE" in the last 168 hours. No results for input(s): "AMMONIA" in the last 168 hours. CBC: Recent Labs  Lab 04/06/23 2220 04/07/23 0540 04/07/23 0842 04/07/23 1930 04/07/23 2235 04/08/23 0707  WBC 23.3* 18.2*  --   --   --  13.9*  NEUTROABS 19.8*  --   --   --   --   --   HGB 7.5* 6.3* 5.9* 7.7* 8.5* 8.8*  HCT 25.4* 22.5* 20.5* 25.1* 27.7* 28.0*  MCV 74.3* 76.5*  --   --   --  76.3*  PLT 516* 461*  --   --   --  413*   Cardiac Enzymes: No results for input(s): "CKTOTAL", "CKMB", "CKMBINDEX", "TROPONINI" in the last 168 hours. BNP (last 3 results) Recent Labs    12/14/22 0839  BNP 197.3*    ProBNP (last 3 results) No results for input(s): "PROBNP" in the last 8760 hours.  CBG: Recent Labs  Lab 04/07/23 2038 04/07/23 2332 04/08/23 0548 04/08/23 0830 04/08/23 1152  GLUCAP 132* 122* 120* 84 90    Recent Results (from the past 240 hour(s))  Culture, blood (Routine x 2)     Status: None (Preliminary result)   Collection Time: 04/06/23 10:20 PM   Specimen: BLOOD  Result Value Ref Range Status   Specimen Description   Final    BLOOD LEFT ANTECUBITAL Performed at [facility], [address], [place], [name] [address]    Special Requests   Final    BOTTLES DRAWN AEROBIC AND ANAEROBIC Blood Culture adequate  volume Performed at [facility], [address], [place], [name] [address]    Culture   Final    NO GROWTH 2 DAYS Performed at [facility], [address], [place], [name] [address]    Report Status PENDING  Incomplete  Culture, blood (Routine x 2)     Status: None (Preliminary result)   Collection Time: 04/06/23 10:32 PM   Specimen: BLOOD  Result Value Ref Range Status   Specimen Description   Final    BLOOD BLOOD LEFT FOREARM Performed at [facility], [address] [place], [name] [address]    Special Requests   Final    BOTTLES DRAWN AEROBIC AND ANAEROBIC Blood Culture results may not be optimal due to an inadequate volume of blood received in culture bottles Performed at [facility], [address] [place], [name] [address]    Culture   Final    NO GROWTH 2 DAYS Performed at [facility], [address] [place], [name] [address]    Report Status PENDING  Incomplete  Culture, blood (routine x 2) Call MD if unable to obtain prior to  antibiotics being given     Status: None (Preliminary result)   Collection Time: 04/07/23  5:40 AM   Specimen: BLOOD RIGHT ARM  Result Value Ref Range Status   Specimen Description   Final    BLOOD RIGHT ARM Performed at [facility], [address] [place], [name] [address]    Special Requests   Final    BOTTLES DRAWN AEROBIC ONLY Blood Culture adequate volume Performed at [facility], [address], [place], [name] [address]    Culture   Final    NO GROWTH < 24 HOURS Performed at [facility], [address], [place], [name] [address]    Report Status PENDING  Incomplete  Culture, blood (routine x 2) Call MD if unable to obtain prior to antibiotics being given     Status: None (Preliminary result)   Collection Time: 04/07/23  5:40 AM   Specimen: BLOOD RIGHT HAND  Result Value Ref Range Status   Specimen Description    Final    BLOOD RIGHT HAND Performed at [facility], [address], [place], [name] [address]    Special Requests   Final    BOTTLES DRAWN AEROBIC ONLY Blood Culture adequate volume Performed at [facility], [address] [place], [name] [address]    Culture   Final    NO GROWTH < 24 HOURS Performed at [facility], [address], [place], [name] [address]    Report Status PENDING  Incomplete  Surgical pcr screen     Status: None   Collection Time: 04/08/23  2:10 AM   Specimen: Nasal Mucosa; Nasal Swab  Result Value Ref Range Status   MRSA, PCR NEGATIVE NEGATIVE Final   Staphylococcus aureus NEGATIVE NEGATIVE Final    Comment: (NOTE) The Xpert SA Assay (FDA approved for NASAL specimens in patients 58 years of age and older), is one component of a comprehensive surveillance program. It is not intended to diagnose infection nor to guide or monitor treatment. Performed at [facility], [address], [place], [name] [address]      Studies: DG Chest Port 1 View  Result Date: 04/06/2023 CLINICAL DATA:  Sepsis EXAM: PORTABLE CHEST 1 VIEW COMPARISON:  Chest x-ray 12/14/2022 FINDINGS: There is a small left pleural effusion. There are minimal patchy opacities in the left lung base. The right lung is clear. The cardiomediastinal silhouette is within normal limits. There is no pneumothorax or acute fracture. IMPRESSION: 1. Small left pleural effusion. 2. Minimal patchy opacities in the left lung base may represent atelectasis or infection. Electronically Signed   By: Darliss Cheney M.D.   On: 04/06/2023 23:38   DG Foot Complete Right  Result Date: 04/06/2023 CLINICAL DATA:  Foot infection. EXAM: RIGHT FOOT COMPLETE - 3+ VIEW COMPARISON:  None Available. FINDINGS: There is soft tissue swelling and air in the foot surrounding the metatarsophalangeal joints. There are foreign body needle fragments adjacent to 1 another measuring 5 mm each in the  plantar soft tissues of the base of the 2/3 toes. There is no definite underlying cortical erosion in this region. There is soft tissue air, swelling and ulceration overlying the posterior and plantar aspect of the calcaneus with significant underlying osseous erosion. There is no acute fracture or dislocation IMPRESSION: 1. Soft tissue swelling and air in the foot surrounding the metatarsophalangeal joints compatible with infection. 2. Foreign body needle fragments in the plantar soft tissues  of the base of the 2/3 toes. 3. Soft tissue air, swelling and ulceration overlying the posterior and plantar aspect of the calcaneus with significant underlying osseous erosion compatible with osteomyelitis. Electronically Signed   By: Darliss Cheney M.D.   On: 04/06/2023 23:33    Scheduled Meds:  [ZOX Hold] sodium chloride   Intravenous Once   [MAR Hold] sodium chloride   Intravenous Once   [MAR Hold] sodium chloride   Intravenous Once   [MAR Hold] sodium chloride   Intravenous Once   [MAR Hold] feeding supplement  237 mL Oral BID BM   [MAR Hold] sodium chloride flush  3 mL Intravenous Q12H   Continuous Infusions:  vancomycin HCl     sodium chloride 75 mL/hr at 04/08/23 1210   [MAR Hold] doxycycline (VIBRAMYCIN) IV 100 mg (04/08/23 0204)   lactated ringers     levofloxacin (LEVAQUIN) IV     [MAR Hold] piperacillin-tazobactam (ZOSYN)  IV 3.375 g (04/08/23 0834)   tranexamic acid     vancomycin      Principal Problem:   Sepsis (HCC) Active Problems:   Acute osteomyelitis of right foot (HCC)   Acute on chronic anemia   Metabolic acidosis   CAP (community acquired pneumonia)   Diarrhea   Essential hypertension   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)   Insulin dependent type 2 diabetes mellitus (HCC)   CKD (chronic kidney disease), stage II   Hx of left BKA (HCC)   PVD (peripheral vascular disease) (HCC)   Subacute osteomyelitis of right foot (HCC)    Cutaneous abscess of right foot  Consultants: Orthopedic surgery  Procedures: Right BKA  Antibiotics: Vancomycin, levofloxacin 11/25  Time spent: 40 minutes    Rustyn Conery MD  [facility] 7 am - 330 pm/M-F for direct patient care and secure chat Please refer to Amion for contact info 1  days

## 2023-04-08 NOTE — Plan of Care (Signed)
  Problem: Clinical Measurements: Goal: Diagnostic test results will improve Outcome: Progressing   Problem: Clinical Measurements: Goal: Signs and symptoms of infection will decrease Outcome: Progressing   Problem: Respiratory: Goal: Ability to maintain adequate ventilation will improve Outcome: Progressing   Problem: Education: Goal: Knowledge of General Education information will improve Description: Including pain rating scale, medication(s)/side effects and non-pharmacologic comfort measures Outcome: Progressing   Problem: Clinical Measurements: Goal: Ability to maintain clinical measurements within normal limits will improve Outcome: Progressing

## 2023-04-08 NOTE — Progress Notes (Signed)
PT Cancellation Note  Patient Details Name: Alan Tapia. MRN: [id number] DOB: 03-Jan-1976   Cancelled Treatment:    Reason Eval/Treat Not Completed: Medical issues which prohibited therapy (Pt planed for BKA today. Will hold until able and appropriate.)  Harrel Carina, DPT, CLT  Acute Rehabilitation Services Office: [phone] (Secure chat preferred)  Claudia Desanctis 04/08/2023, 9:06 AM

## 2023-04-08 NOTE — Transfer of Care (Signed)
Immediate Anesthesia Transfer of Care Note  Patient: Alan Tapia.  Procedure(s) Performed: RIGHT BELOW KNEE AMPUTATION (Right: Knee)  Patient Location: PACU  Anesthesia Type:General and Regional  Level of Consciousness: awake, drowsy, patient cooperative, and responds to stimulation  Airway & Oxygen Therapy: Patient Spontanous Breathing and Patient connected to nasal cannula oxygen  Post-op Assessment: Report given to RN and Post -op Vital signs reviewed and stable  Post vital signs: Reviewed and stable  Last Vitals:  Vitals Value Taken Time  BP    Temp    Pulse    Resp    SpO2      Last Pain:  Vitals:   04/08/23 1420  TempSrc:   PainSc: 5       Patients Stated Pain Goal: 3 (04/08/23 1420)  Complications: No notable events documented.

## 2023-04-08 NOTE — Progress Notes (Signed)
OT Cancellation Note  Patient Details Name: Alan Tapia. MRN: [id number] DOB: [birth year]   Cancelled Treatment:    Reason Eval/Treat Not Completed: Medical issues which prohibited therapy (Pt with scheduled BKA today. OT to hold eval until able and appropriate.)  Tamira Ryland "Orson Eva., OTR/L, MA Acute Rehab [phone]   Lendon Colonel 04/08/2023, 11:29 AM

## 2023-04-08 NOTE — Progress Notes (Signed)
Patient's hemoglobin 8.5. Fourth unit of blood held per Dr. Loney Loh.

## 2023-04-08 NOTE — TOC Progression Note (Signed)
Transition of Care (TOC) - Progression Note    Patient Details  Name: Alan Tapia. MRN: [id number] Date of Birth: Sep 14, 1975  Transition of Care [facility]) CM/SW Contact  Nicanor Bake Phone Number: [phone] 04/08/2023, 1:37 PM  Clinical Narrative:   HF CSW and another CSW was in the social work office and we received a knock at the door from [employer] and pts wife. Chaplain stated that the pts wife needed social work assistance. Pts wife stated that they are homeless and have no where to go after dc. HF CSW asked the wife if they have a caseworker o assist their family. Pts wife stated yes, but caseworker encouraged her to get assistance from hospital social workers. HF CSW provided pts wife with a housing resource list. CSW encouraged pts wife to speak to the unit social worker as well, but explained from the hospital there are limited housing resources in the community and it can be a lengthy process.   TOC will continue following.     Expected Discharge Plan: Skilled Nursing Facility Barriers to Discharge: Continued Medical Work up  Expected Discharge Plan and Services                                               Social Determinants of Health (SDOH) Interventions SDOH Screenings   Food Insecurity: No Food Insecurity (04/07/2023)  Housing: Medium Risk (04/07/2023)  Transportation Needs: No Transportation Needs (04/07/2023)  Recent Concern: Transportation Needs - Unmet Transportation Needs (02/17/2023)   Received from [facility]  Utilities: At Risk (04/07/2023)  Alcohol Screen: Low Risk  (08/20/2022)  Financial Resource Strain: Patient Unable To Answer (08/20/2022)  Tobacco Use: Low Risk  (04/08/2023)    Readmission Risk Interventions    04/07/2023    2:34 PM  Readmission Risk Prevention Plan  Transportation Screening Complete  PCP or Specialist Appt within 3-5 Days Complete  HRI or Home Care Consult Complete  Social Work Consult for  Recovery Care Planning/Counseling Complete  Palliative Care Screening Not Applicable  Medication Review Oceanographer) Complete

## 2023-04-08 NOTE — Progress Notes (Signed)
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

## 2023-04-08 NOTE — TOC Progression Note (Signed)
Transition of Care (TOC) - Progression Note    Patient Details  Name: Alan Tapia. MRN: [id number] Date of Birth: [birth year]  Transition of Care [facility]) CM/SW Contact  Eduard Roux, [name] Phone Number: 04/08/2023, 2:09 PM  Clinical Narrative:     CSW received message from HF CSW- informed patient's spouse states no place to go after d/c form hospital. Housing resources has been provided by HF CSW. Unfortunately, TOC can only provided resources, Called patient's wife to follow up- no answer.   Antony Blackbird, MSW, LCSW Clinical Social Worker    Expected Discharge Plan: Skilled Nursing Facility Barriers to Discharge: Continued Medical Work up  Expected Discharge Plan and Services                                               Social Determinants of Health (SDOH) Interventions SDOH Screenings   Food Insecurity: No Food Insecurity (04/07/2023)  Housing: Medium Risk (04/07/2023)  Transportation Needs: No Transportation Needs (04/07/2023)  Recent Concern: Transportation Needs - Unmet Transportation Needs (02/17/2023)   Received from [facility]  Utilities: At Risk (04/07/2023)  Alcohol Screen: Low Risk  (08/20/2022)  Financial Resource Strain: Patient Unable To Answer (08/20/2022)  Tobacco Use: Low Risk  (04/08/2023)    Readmission Risk Interventions    04/07/2023    2:34 PM  Readmission Risk Prevention Plan  Transportation Screening Complete  PCP or Specialist Appt within 3-5 Days Complete  HRI or Home Care Consult Complete  Social Work Consult for Recovery Care Planning/Counseling Complete  Palliative Care Screening Not Applicable  Medication Review Oceanographer) Complete

## 2023-04-08 NOTE — Anesthesia Postprocedure Evaluation (Signed)
Anesthesia Post Note  Patient: Alan Tapia.  Procedure(s) Performed: RIGHT BELOW KNEE AMPUTATION (Right: Knee)     Patient location during evaluation: PACU Anesthesia Type: General and Regional Level of consciousness: awake and alert Pain management: pain level controlled Vital Signs Assessment: post-procedure vital signs reviewed and stable Respiratory status: spontaneous breathing, nonlabored ventilation, respiratory function stable and patient connected to nasal cannula oxygen Cardiovascular status: blood pressure returned to baseline and stable Postop Assessment: no apparent nausea or vomiting Anesthetic complications: no   No notable events documented.  Last Vitals:  Vitals:   04/08/23 1600 04/08/23 1615  BP: 116/88 (!) 143/68  Pulse: 99 96  Resp: 16 15  Temp:  37.1 C  SpO2: 90% 97%    Last Pain:  Vitals:   04/08/23 1615  TempSrc:   PainSc: Asleep                 [place]

## 2023-04-08 NOTE — Progress Notes (Signed)
Patient's pain was 8/10 on his right leg, after right BKA, so, while giving him diludid 1mg , the second syringe containing 0.5mg  leaked through his left wrist IV so, wasted with the witness and another 0.5mg  given through other I/V site.

## 2023-04-08 NOTE — Anesthesia Procedure Notes (Signed)
Anesthesia Regional Block: Adductor canal block   Pre-Anesthetic Checklist: , timeout performed,  Correct Patient, Correct Site, Correct Laterality,  Correct Procedure, Correct Position, site marked,  Risks and benefits discussed,  Surgical consent,  Pre-op evaluation,  At surgeon's request and post-op pain management  Laterality: Lower and Right  Prep: chloraprep       Needles:  Injection technique: Single-shot  Needle Type: Echogenic Stimulator Needle     Needle Length: 9cm  Needle Gauge: 20   Needle insertion depth: 4 cm   Additional Needles:   Procedures:,,,, ultrasound used (permanent image in chart),,    Narrative:  Start time: 04/08/2023 2:05 PM End time: 04/08/2023 2:15 PM Injection made incrementally with aspirations every 5 mL.  Performed by: Personally  Anesthesiologist: Leilani Able, MD

## 2023-04-08 NOTE — Anesthesia Procedure Notes (Signed)
Procedure Name: LMA Insertion Date/Time: 04/08/2023 2:40 PM  Performed by: Allyn Kenner, CRNAPre-anesthesia Checklist: Patient identified, Emergency Drugs available, Suction available and Patient being monitored Patient Re-evaluated:Patient Re-evaluated prior to induction Oxygen Delivery Method: Circle System Utilized Preoxygenation: Pre-oxygenation with 100% oxygen Induction Type: IV induction Ventilation: Mask ventilation without difficulty LMA: LMA inserted LMA Size: 5.0 Number of attempts: 1 Airway Equipment and Method: Bite block Placement Confirmation: positive ETCO2 Tube secured with: Tape Dental Injury: Teeth and Oropharynx as per pre-operative assessment

## 2023-04-08 NOTE — Anesthesia Preprocedure Evaluation (Addendum)
Anesthesia Evaluation  Patient identified by MRN, date of birth, ID band Patient awake    Reviewed: Allergy & Precautions, H&P , NPO status , Patient's Chart, lab work & pertinent test results  Airway Mallampati: II  TM Distance: >3 FB Neck ROM: Full    Dental no notable dental hx.    Pulmonary neg pulmonary ROS   Pulmonary exam normal breath sounds clear to auscultation       Cardiovascular hypertension, Pt. on home beta blockers and Pt. on medications Normal cardiovascular exam Rhythm:Regular Rate:Normal     Neuro/Psych negative neurological ROS  negative psych ROS   GI/Hepatic negative GI ROS, Neg liver ROS,,,  Endo/Other  diabetes, Poorly Controlled, Type 2, Insulin Dependent  Class 3 obesity  Renal/GU negative Renal ROS  negative genitourinary   Musculoskeletal negative musculoskeletal ROS (+)    Abdominal  (+) + obese  Peds negative pediatric ROS (+)  Hematology negative hematology ROS (+)   Anesthesia Other Findings   Reproductive/Obstetrics negative OB ROS                             Anesthesia Physical Anesthesia Plan  ASA: 3  Anesthesia Plan: General   Post-op Pain Management: Dilaudid IV   Induction: Intravenous  PONV Risk Score and Plan: 2 and Ondansetron and Treatment may vary due to age or medical condition  Airway Management Planned: LMA and Oral ETT  Additional Equipment: None  Intra-op Plan:   Post-operative Plan: Extubation in OR  Informed Consent: I have reviewed the patients History and Physical, chart, labs and discussed the procedure including the risks, benefits and alternatives for the proposed anesthesia with the patient or authorized representative who has indicated his/her understanding and acceptance.     Dental advisory given  Plan Discussed with: CRNA  Anesthesia Plan Comments: (Has had #5 LMA's previously)       Anesthesia Quick  Evaluation

## 2023-04-08 NOTE — Op Note (Signed)
04/08/2023  3:30 PM  PATIENT:  Alan Tapia.    PRE-OPERATIVE DIAGNOSIS:  GANGRENE RIGHT FOOT  POST-OPERATIVE DIAGNOSIS:  Same  PROCEDURE:  RIGHT BELOW KNEE AMPUTATION  Application of Prevena customizable and Prevena arthroform wound VAC dressings Application of Vive Wear stump shrinker and the [employer] limb protector  SURGEON:  Nadara Mustard, MD  ANESTHESIA:   General  PREOPERATIVE INDICATIONS:  Alan Kazmierski. is a  47 y.o. male with a diagnosis of GANGRENE RIGHT FOOT who failed conservative measures and elected for surgical management.    The risks benefits and alternatives were discussed with the patient preoperatively including but not limited to the risks of infection, bleeding, nerve injury, cardiopulmonary complications, the need for revision surgery, among others, and the patient was willing to proceed.  OPERATIVE IMPLANTS:   * No implants in log *   OPERATIVE FINDINGS: Tissue margins were clear.  OPERATIVE PROCEDURE: Patient was brought to the operating room after undergoing a regional anesthetic.  After adequate levels anesthesia were obtained a thigh tourniquet was placed and the lower extremity was prepped using DuraPrep draped into a sterile field. The foot was draped out of the sterile field with impervious stockinette.  A timeout was called and the tourniquet inflated.  A transverse skin incision was made 12 cm distal to the tibial tubercle, the incision curved proximally, and a large posterior flap was created.  The tibia was transected just proximal to the skin incision and beveled anteriorly.  The fibula was transected just proximal to the tibial incision.  The sciatic nerve was pulled cut and allowed to retract.  The vascular bundles were suture ligated with 2-0 silk.  The tourniquet was deflated and hemostasis obtained.    The deep and superficial fascial layers were closed using #1 Vicryl.  The skin was closed using staples.    The Prevena customizable  dressing was applied this was overwrapped with the arthroform sponge.  Alan Tapia was used to secure the sponges and the circumferential compression was secured to the skin with Dermatac.  This was connected to the wound VAC pump and had a good suction fit this was covered with a stump shrinker and a limb protector.  Patient was taken to the PACU in stable condition.   DISCHARGE PLANNING:  Antibiotic duration: 24-hour antibiotics  Weightbearing: Nonweightbearing on the operative extremity  Pain medication: Opioid pathway  Dressing care/ Wound VAC: Continue wound VAC with the Prevena plus pump at discharge for 1 week  Ambulatory devices: Walker or kneeling scooter  Discharge to: Discharge planning based on recommendations per physical therapy  Follow-up: In the office 1 week after discharge.

## 2023-04-08 NOTE — Plan of Care (Signed)
Problem: Fluid Volume: Goal: Hemodynamic stability will improve Outcome: Progressing   Problem: Clinical Measurements: Goal: Diagnostic test results will improve Outcome: Progressing Goal: Signs and symptoms of infection will decrease Outcome: Progressing   Problem: Respiratory: Goal: Ability to maintain adequate ventilation will improve Outcome: Progressing   Problem: Education: Goal: Knowledge of General Education information will improve Description: Including pain rating scale, medication(s)/side effects and non-pharmacologic comfort measures Outcome: Progressing   Problem: Health Behavior/Discharge Planning: Goal: Ability to manage health-related needs will improve Outcome: Progressing   Problem: Clinical Measurements: Goal: Ability to maintain clinical measurements within normal limits will improve Outcome: Progressing Goal: Will remain free from infection Outcome: Progressing Goal: Diagnostic test results will improve Outcome: Progressing Goal: Respiratory complications will improve Outcome: Progressing Goal: Cardiovascular complication will be avoided Outcome: Progressing   Problem: Activity: Goal: Risk for activity intolerance will decrease Outcome: Progressing   Problem: Nutrition: Goal: Adequate nutrition will be maintained Outcome: Progressing   Problem: Coping: Goal: Level of anxiety will decrease Outcome: Progressing   Problem: Elimination: Goal: Will not experience complications related to bowel motility Outcome: Progressing Goal: Will not experience complications related to urinary retention Outcome: Progressing   Problem: Pain Management: Goal: General experience of comfort will improve Outcome: Progressing   Problem: Safety: Goal: Ability to remain free from injury will improve Outcome: Progressing   Problem: Skin Integrity: Goal: Risk for impaired skin integrity will decrease Outcome: Progressing   Problem: Activity: Goal: Ability  to tolerate increased activity will improve Outcome: Progressing   Problem: Clinical Measurements: Goal: Ability to maintain a body temperature in the normal range will improve Outcome: Progressing   Problem: Respiratory: Goal: Ability to maintain adequate ventilation will improve Outcome: Progressing Goal: Ability to maintain a clear airway will improve Outcome: Progressing

## 2023-04-09 ENCOUNTER — Encounter (HOSPITAL_COMMUNITY): Payer: Self-pay | Admitting: Orthopedic Surgery

## 2023-04-09 DIAGNOSIS — M86171 Other acute osteomyelitis, right ankle and foot: Secondary | ICD-10-CM | POA: Diagnosis not present

## 2023-04-09 LAB — HEMOGLOBIN AND HEMATOCRIT, BLOOD
HCT: 27.6 % — ABNORMAL LOW (ref 39.0–52.0)
HCT: 26 % — ABNORMAL LOW (ref 39.0–52.0)
HCT: 25.5 % — ABNORMAL LOW (ref 39.0–52.0)
Hemoglobin: 8.3 g/dL — ABNORMAL LOW (ref 13.0–17.0)
Hemoglobin: 7.8 g/dL — ABNORMAL LOW (ref 13.0–17.0)
Hemoglobin: 7.9 g/dL — ABNORMAL LOW (ref 13.0–17.0)

## 2023-04-09 LAB — COMPREHENSIVE METABOLIC PANEL
ALT: 12 U/L (ref 0–44)
AST: 15 U/L (ref 15–41)
Albumin: <1.5 g/dL — ABNORMAL LOW (ref 3.5–5.0)
Alkaline Phosphatase: 203 U/L — ABNORMAL HIGH (ref 38–126)
Anion gap: 8 (ref 5–15)
BUN: 11 mg/dL (ref 6–20)
CO2: 20 mmol/L — ABNORMAL LOW (ref 22–32)
Calcium: 6.9 mg/dL — ABNORMAL LOW (ref 8.9–10.3)
Chloride: 105 mmol/L (ref 98–111)
Creatinine, Ser: 1.51 mg/dL — ABNORMAL HIGH (ref 0.61–1.24)
GFR, Estimated: 57 mL/min — ABNORMAL LOW (ref >60)
Glucose, Bld: 84 mg/dL (ref 70–99)
Potassium: 4 mmol/L (ref 3.5–5.1)
Sodium: 133 mmol/L — ABNORMAL LOW (ref 135–145)
Total Bilirubin: 0.8 mg/dL (ref <1.2)
Total Protein: 5.7 g/dL — ABNORMAL LOW (ref 6.5–8.1)

## 2023-04-09 LAB — GLUCOSE, CAPILLARY
Glucose-Capillary: 108 mg/dL — ABNORMAL HIGH (ref 70–99)
Glucose-Capillary: 112 mg/dL — ABNORMAL HIGH (ref 70–99)
Glucose-Capillary: 71 mg/dL (ref 70–99)
Glucose-Capillary: 76 mg/dL (ref 70–99)
Glucose-Capillary: 82 mg/dL (ref 70–99)
Glucose-Capillary: 83 mg/dL (ref 70–99)

## 2023-04-09 LAB — CBC
HCT: 25.2 % — ABNORMAL LOW (ref 39.0–52.0)
Hemoglobin: 8 g/dL — ABNORMAL LOW (ref 13.0–17.0)
MCH: 24.1 pg — ABNORMAL LOW (ref 26.0–34.0)
MCHC: 31.7 g/dL (ref 30.0–36.0)
MCV: 75.9 fL — ABNORMAL LOW (ref 80.0–100.0)
Platelets: 416 10*3/uL — ABNORMAL HIGH (ref 150–400)
RBC: 3.32 MIL/uL — ABNORMAL LOW (ref 4.22–5.81)
RDW: 18.5 % — ABNORMAL HIGH (ref 11.5–15.5)
WBC: 13.1 10*3/uL — ABNORMAL HIGH (ref 4.0–10.5)
nRBC: 0 % (ref 0.0–0.2)

## 2023-04-09 LAB — LEGIONELLA PNEUMOPHILA SEROGP 1 UR AG: L. pneumophila Serogp 1 Ur Ag: NEGATIVE

## 2023-04-09 MED ORDER — DOXYCYCLINE HYCLATE 100 MG IV SOLR
100.0000 mg | Freq: Two times a day (BID) | INTRAVENOUS | Status: AC
  Administered 2023-04-09: 100 mg via INTRAVENOUS
  Filled 2023-04-09: qty 100

## 2023-04-09 NOTE — Plan of Care (Signed)
  Problem: Fluid Volume: Goal: Hemodynamic stability will improve Outcome: Progressing   Problem: Clinical Measurements: Goal: Diagnostic test results will improve Outcome: Progressing Goal: Signs and symptoms of infection will decrease Outcome: Progressing   Problem: Respiratory: Goal: Ability to maintain adequate ventilation will improve Outcome: Progressing   Problem: Education: Goal: Knowledge of General Education information will improve Description: Including pain rating scale, medication(s)/side effects and non-pharmacologic comfort measures Outcome: Progressing   Problem: Health Behavior/Discharge Planning: Goal: Ability to manage health-related needs will improve Outcome: Progressing   Problem: Clinical Measurements: Goal: Ability to maintain clinical measurements within normal limits will improve Outcome: Progressing Goal: Will remain free from infection Outcome: Progressing Goal: Diagnostic test results will improve Outcome: Progressing Goal: Respiratory complications will improve Outcome: Progressing Goal: Cardiovascular complication will be avoided Outcome: Progressing   Problem: Activity: Goal: Risk for activity intolerance will decrease Outcome: Progressing   Problem: Nutrition: Goal: Adequate nutrition will be maintained Outcome: Progressing   Problem: Coping: Goal: Level of anxiety will decrease Outcome: Progressing   Problem: Elimination: Goal: Will not experience complications related to bowel motility Outcome: Progressing Goal: Will not experience complications related to urinary retention Outcome: Progressing   Problem: Pain Management: Goal: General experience of comfort will improve Outcome: Progressing   Problem: Safety: Goal: Ability to remain free from injury will improve Outcome: Progressing   Problem: Skin Integrity: Goal: Risk for impaired skin integrity will decrease Outcome: Progressing   Problem: Clinical  Measurements: Goal: Ability to maintain clinical measurements within normal limits will improve Outcome: Progressing Goal: Will remain free from infection Outcome: Progressing Goal: Diagnostic test results will improve Outcome: Progressing Goal: Respiratory complications will improve Outcome: Progressing Goal: Cardiovascular complication will be avoided Outcome: Progressing

## 2023-04-09 NOTE — TOC Progression Note (Addendum)
Transition of Care (TOC) - Progression Note    Patient Details  Name: Alan Tapia. MRN: [id number] Date of Birth: [birth year]  Transition of Care [facility]) CM/SW Contact  Eduard Roux, [name] Phone Number: 04/09/2023, 1:43 PM  Clinical Narrative:     CSW met with patient at bedside and spouse. CSW discussed with patient potential short term rehab at [facility]. Patient states only he and his spouse in the home. If recommended, he is agreeable to rehab at [facility]. CSW explained the SNF process and potential barriers. Patient and spouse states understanding. Patient states after rehab the plan is to return home. Patient states has hospital bed from [facility] but the bed is too broken and is too small, including the bedside commode. CSW explained will follow up on DME at another time. No other concerns at this time.   CSW encouraged spouse to follow up on resources in the community for assistance with transportation,etc.   TOC will provide bed offers once available TOC will continue to follow and assist with discharge planning.  Antony Blackbird, MSW, LCSW Clinical Social Worker     Expected Discharge Plan: Skilled Nursing Facility Barriers to Discharge: Insurance Authorization, Continued Medical Work up, SNF Pending bed offer  Expected Discharge Plan and Services In-house Referral: Clinical Social Work     Living arrangements for the past 2 months: Single Family Home                                       Social Determinants of Health (SDOH) Interventions SDOH Screenings   Food Insecurity: No Food Insecurity (04/07/2023)  Housing: Medium Risk (04/07/2023)  Transportation Needs: No Transportation Needs (04/07/2023)  Recent Concern: Transportation Needs - Unmet Transportation Needs (02/17/2023)   Received from [facility]  Utilities: At Risk (04/07/2023)  Alcohol Screen: Low Risk  (08/20/2022)  Financial Resource Strain: Patient Unable To Answer (08/20/2022)  Tobacco Use: Low  Risk  (04/08/2023)    Readmission Risk Interventions    04/07/2023    2:34 PM  Readmission Risk Prevention Plan  Transportation Screening Complete  PCP or Specialist Appt within 3-5 Days Complete  HRI or Home Care Consult Complete  Social Work Consult for Recovery Care Planning/Counseling Complete  Palliative Care Screening Not Applicable  Medication Review Oceanographer) Complete

## 2023-04-09 NOTE — Progress Notes (Signed)
Patient ID: Alan Tapia., male   DOB: March 20, 1976, 47 y.o.   MRN: [id number] Patient is a 47 year old gentleman who is postoperative day 1 right below-knee amputation.  Patient has no drainage in the wound VAC canister there is a good suction fit.  Anticipate patient will need discharge to skilled nursing.

## 2023-04-09 NOTE — Progress Notes (Signed)
[facility] PROGRESS NOTE  Alan Tapia. ZHY:[id number] DOB: November 26, 1975 DOA: 04/06/2023 PCP: Patient, No Pcp Per  Status is: Inpatient Remains inpatient appropriate because: Necrotic.,  Surgery  Barriers to discharge: Social: Bilateral amputation Clinical:  Level of care: Progressive   Code Status: Full code Family Communication: Wife at bedside DVT prophylaxis:  COVID vaccination status:  Foley catheter: POA: Date inserted:   Subjective: Complaining of pain in the foot  Objective: Vitals:   04/09/23 0828 04/09/23 1153  BP: (!) 151/85 139/72  Pulse: 92 94  Resp: 16 18  Temp: 98.1 F (36.7 C) 98.3 F (36.8 C)  SpO2: 100% 100%    Intake/Output Summary (Last 24 hours) at 04/09/2023 1307 Last data filed at 04/09/2023 0800 Gross per 24 hour  Intake 1407.13 ml  Output 1335 ml  Net 72.13 ml   Filed Weights   04/08/23 0500 04/08/23 1244 04/09/23 0403  Weight: (!) 142.9 kg (!) 142.9 kg (!) 142.8 kg    Exam:  Constitutional: NAD, calm, comfortable Eyes: PERRL, lids and conjunctivae normal ENMT: Mucous membranes are moist. Posterior pharynx clear of any exudate or lesions.Normal dentition.  Neck: normal, supple, no masses, no thyromegaly Respiratory: clear to auscultation bilaterally, no wheezing, no crackles. Normal respiratory effort. No accessory muscle use.  Cardiovascular: Regular rate and rhythm, no murmurs / rubs / gallops. No extremity edema. 2+ pedal pulses. No carotid bruits.  Abdomen: no tenderness, no masses palpated. No hepatosplenomegaly. Bowel sounds positive.  Musculoskeletal: no clubbing / cyanosis. No joint deformity upper and lower extremities. Good ROM, no contractures. Normal muscle tone.  Skin: no rashes, lesions, ulcers. No induration Neurologic: CN 2-12 grossly intact. Sensation intact, DTR normal. Strength 5/5 x all 4 extremities.  Psychiatric: Normal judgment and insight. Alert and oriented x 3. Normal mood.     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  Patient was admitted to [facility] in August 2020 for when had left-sided foot infection since then lost follow-up with the primary care physician, last follow-up with wound care clinic.  Also has not been receiving his blood pressure medications or insulin for the last 4 months.  Patient comes to the emergency department with complaints of nausea, vomiting, diarrhea for the last 1 month.  Orthopedic surgery is consulted.  Going for amputation of the right leg BKA.  Patient is started on broad-spectrum antibiotics with Zosyn, vancomycin.  Patient was found to have low hemoglobin of 5.9, transfused 4 units of packed RBC with improvement of the hemoglobin to 8.8.  Nonhealing right foot ulcer with necrotic -S/p right BKA 11/27 -Continue with wound VAC  Sepsis secondary to necrotic right foot ulcers, present on admission -Continue rectal vancomycin, cefepime -Admitted with a WBC count of 23,000, tachycardia, elevated lactic acid of 2.8 -WBC count trending down to 13.1  Anemia multifactorial -No anemia workup on discharge -Will get the anemia workup in 1 week as patient received 4 units of PRBC -Get stool occult -Closely follow-up with CBC. -Hemoglobin stable  Diabetes mellitus poorly controlled with hyperglycemia -Get hemoglobin A1c-6.8 -Continue with Lantus 10 units daily -Follow-up on sliding scale insulin.  Severe protein calorie malnutrition -Patient has albumin of less than 1.5 -Prealbumin level.  Acute  on chronic kidney failure stage  IIIa -Secondary to poorly controlled diabetes mellitus, noncompliance with medications for hypertension. -Get urinalysis to rule out any infection -Check CPK level  Peripheral vascular disease -S/p left BKA, s/p BKA  Hypertension accelerated -Keep her on Coreg 25 mg twice daily Data Reviewed: Basic Metabolic Panel: Recent Labs  Lab 04/06/23 2220 04/07/23 0540 04/08/23 0707 04/09/23 0619  NA 133* 135 136 133*  K 3.5 3.5 3.6 4.0  CL 106 107 107 105  CO2 21* 21* 21* 20*  GLUCOSE 113* 106* 103* 84  BUN 13 13 12 11   CREATININE 1.40* 1.38* 1.52* 1.51*  CALCIUM 6.7* 6.9* 7.2* 6.9*   Liver Function Tests: Recent Labs  Lab 04/06/23 2220 04/07/23 0540 04/08/23 0707 04/09/23 0619  AST 17 12* 9* 15  ALT 13 10 12 12   ALKPHOS 299* 261* 215* 203*  BILITOT 0.5 0.7 0.8 0.8  PROT 6.4* 5.9* 5.8* 5.7*  ALBUMIN <1.5* <1.5* <1.5* <1.5*   No results for input(s): "LIPASE", "AMYLASE" in the last 168 hours. No results for input(s): "AMMONIA" in the last 168 hours. CBC: Recent Labs  Lab 04/06/23 2220 04/07/23 0540 04/07/23 0842 04/07/23 1930 04/07/23 2235 04/08/23 0707 04/09/23 0619 04/09/23 1139  WBC 23.3* 18.2*  --   --   --  13.9* 13.1*  --   NEUTROABS 19.8*  --   --   --   --   --   --   --   HGB 7.5* 6.3*   < > 7.7* 8.5* 8.8* 8.0* 8.3*  HCT 25.4* 22.5*   < > 25.1* 27.7* 28.0* 25.2* 27.6*  MCV 74.3* 76.5*  --   --   --  76.3* 75.9*  --   PLT 516* 461*  --   --   --  413* 416*  --    < > = values in this interval not displayed.   Cardiac Enzymes: No results for input(s): "CKTOTAL", "CKMB", "CKMBINDEX", "TROPONINI" in the last 168 hours. BNP (last 3 results) Recent Labs    12/14/22 0839  BNP 197.3*    ProBNP (last 3 results) No results for input(s): "PROBNP" in the last 8760 hours.  CBG: Recent Labs  Lab 04/08/23 2014 04/08/23 2358 04/09/23 0407 04/09/23 0834 04/09/23 1151  GLUCAP 73 76 83 71 82    Recent Results (from the past 240 hour(s))  Culture,  blood (Routine x 2)     Status: None (Preliminary result)   Collection Time: 04/06/23 10:20 PM   Specimen: BLOOD  Result Value Ref Range Status   Specimen Description   Final    BLOOD LEFT ANTECUBITAL Performed at [facility], [address] [place], [name] [address]    Special Requests   Final    BOTTLES DRAWN AEROBIC AND ANAEROBIC Blood Culture adequate volume Performed at [facility], [address], [place], [name] [address]    Culture   Final    NO GROWTH 3 DAYS Performed at [facility], [address] [place], [name] [address]    Report Status PENDING  Incomplete  Culture, blood (Routine x 2)     Status: None (Preliminary result)   Collection Time: 04/06/23 10:32 PM   Specimen: BLOOD  Result Value Ref Range Status   Specimen Description   Final    BLOOD BLOOD LEFT FOREARM Performed at [facility], [address] [place], [name] [address]    Special Requests   Final  BOTTLES DRAWN AEROBIC AND ANAEROBIC Blood Culture results may not be optimal due to an inadequate volume of blood received in culture bottles Performed at [facility], [address], [place], [name] [address]    Culture   Final    NO GROWTH 3 DAYS Performed at [facility], [address] [place], [name] [address]    Report Status PENDING  Incomplete  Respiratory (~20 pathogens) panel by PCR     Status: None   Collection Time: 04/07/23  1:04 AM   Specimen: Nasopharyngeal Swab; Respiratory  Result Value Ref Range Status   Adenovirus NOT DETECTED NOT DETECTED Final   Coronavirus 229E NOT DETECTED NOT DETECTED Final    Comment: (NOTE) The Coronavirus on the Respiratory Panel, DOES NOT test for the novel  Coronavirus (2019 nCoV)    Coronavirus HKU1 NOT DETECTED NOT DETECTED Final   Coronavirus NL63 NOT DETECTED NOT DETECTED Final   Coronavirus OC43 NOT DETECTED NOT DETECTED Final   Metapneumovirus NOT  DETECTED NOT DETECTED Final   Rhinovirus / Enterovirus NOT DETECTED NOT DETECTED Final   Influenza A NOT DETECTED NOT DETECTED Final   Influenza B NOT DETECTED NOT DETECTED Final   Parainfluenza Virus 1 NOT DETECTED NOT DETECTED Final   Parainfluenza Virus 2 NOT DETECTED NOT DETECTED Final   Parainfluenza Virus 3 NOT DETECTED NOT DETECTED Final   Parainfluenza Virus 4 NOT DETECTED NOT DETECTED Final   Respiratory Syncytial Virus NOT DETECTED NOT DETECTED Final   Bordetella pertussis NOT DETECTED NOT DETECTED Final   Bordetella Parapertussis NOT DETECTED NOT DETECTED Final   Chlamydophila pneumoniae NOT DETECTED NOT DETECTED Final   Mycoplasma pneumoniae NOT DETECTED NOT DETECTED Final    Comment: Performed at [facility], [address] [place], [name] [address]  Culture, blood (routine x 2) Call MD if unable to obtain prior to antibiotics being given     Status: None (Preliminary result)   Collection Time: 04/07/23  5:40 AM   Specimen: BLOOD RIGHT ARM  Result Value Ref Range Status   Specimen Description   Final    BLOOD RIGHT ARM Performed at [facility], [address], [place], [name] [address]    Special Requests   Final    BOTTLES DRAWN AEROBIC ONLY Blood Culture adequate volume Performed at [facility], [address] [place], [name] [address]    Culture   Final    NO GROWTH 2 DAYS Performed at [facility], [address], [place], [name] [address]    Report Status PENDING  Incomplete  Culture, blood (routine x 2) Call MD if unable to obtain prior to antibiotics being given     Status: None (Preliminary result)   Collection Time: 04/07/23  5:40 AM   Specimen: BLOOD RIGHT HAND  Result Value Ref Range Status   Specimen Description   Final    BLOOD RIGHT HAND Performed at [facility], [address], [place], [name] [address]    Special Requests   Final    BOTTLES DRAWN AEROBIC ONLY Blood Culture adequate  volume Performed at [facility], [address] [place], [name] [address]    Culture   Final    NO GROWTH 2 DAYS Performed at [facility], [address] [place], [name] [address]    Report Status PENDING  Incomplete  Surgical pcr screen     Status: None   Collection  Time: 04/08/23  2:10 AM   Specimen: Nasal Mucosa; Nasal Swab  Result Value Ref Range Status   MRSA, PCR NEGATIVE NEGATIVE Final   Staphylococcus aureus NEGATIVE NEGATIVE Final    Comment: (NOTE) The Xpert SA Assay (FDA approved for NASAL specimens in patients 81 years of age and older), is one component of a comprehensive surveillance program. It is not intended to diagnose infection nor to guide or monitor treatment. Performed at [facility], [address], [place], [name] [address]      Studies: No results found.  Scheduled Meds:  sodium chloride   Intravenous Once   sodium chloride   Intravenous Once   sodium chloride   Intravenous Once   sodium chloride   Intravenous Once   vitamin C  1,000 mg Oral Daily   feeding supplement  237 mL Oral BID BM   nutrition supplement (JUVEN)  1 packet Oral BID BM   sodium chloride flush  3 mL Intravenous Q12H   zinc sulfate (50mg  elemental zinc)  220 mg Oral Daily   Continuous Infusions:  sodium chloride 10 mL/hr at 04/09/23 0800   doxycycline (VIBRAMYCIN) IV     magnesium sulfate bolus IVPB     piperacillin-tazobactam (ZOSYN)  IV 3.375 g (04/09/23 0823)    Principal Problem:   Sepsis (HCC) Active Problems:   Acute osteomyelitis of right foot (HCC)   Acute on chronic anemia   Metabolic acidosis   CAP (community acquired pneumonia)   Diarrhea   Essential hypertension   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)   Insulin dependent type 2 diabetes mellitus (HCC)   CKD (chronic kidney disease), stage II   Hx of left BKA (HCC)   PVD (peripheral vascular disease) (HCC)   Subacute  osteomyelitis of right foot (HCC)   Cutaneous abscess of right foot   Subacute osteomyelitis, right ankle and foot (HCC)  Consultants: Orthopedic surgery  Procedures: Right BKA  Antibiotics: Vancomycin, levofloxacin 11/25  Time spent: 40 minutes    Camilla Skeen MD  [facility] 7 am - 330 pm/M-F for direct patient care and secure chat Please refer to Amion for contact info 2  days

## 2023-04-09 NOTE — Plan of Care (Signed)
  Problem: Fluid Volume: Goal: Hemodynamic stability will improve Outcome: Progressing   Problem: Clinical Measurements: Goal: Diagnostic test results will improve Outcome: Progressing Goal: Signs and symptoms of infection will decrease Outcome: Progressing   Problem: Respiratory: Goal: Ability to maintain adequate ventilation will improve Outcome: Progressing   Problem: Education: Goal: Knowledge of General Education information will improve Description: Including pain rating scale, medication(s)/side effects and non-pharmacologic comfort measures Outcome: Progressing   Problem: Health Behavior/Discharge Planning: Goal: Ability to manage health-related needs will improve Outcome: Progressing   Problem: Clinical Measurements: Goal: Ability to maintain clinical measurements within normal limits will improve Outcome: Progressing Goal: Will remain free from infection Outcome: Progressing Goal: Diagnostic test results will improve Outcome: Progressing Goal: Respiratory complications will improve Outcome: Progressing Goal: Cardiovascular complication will be avoided Outcome: Progressing   Problem: Activity: Goal: Risk for activity intolerance will decrease Outcome: Progressing   Problem: Nutrition: Goal: Adequate nutrition will be maintained Outcome: Progressing   Problem: Coping: Goal: Level of anxiety will decrease Outcome: Progressing   Problem: Elimination: Goal: Will not experience complications related to bowel motility Outcome: Progressing Goal: Will not experience complications related to urinary retention Outcome: Progressing   Problem: Pain Management: Goal: General experience of comfort will improve Outcome: Progressing   Problem: Safety: Goal: Ability to remain free from injury will improve Outcome: Progressing   Problem: Skin Integrity: Goal: Risk for impaired skin integrity will decrease Outcome: Progressing   Problem: Activity: Goal: Ability  to tolerate increased activity will improve Outcome: Progressing   Problem: Clinical Measurements: Goal: Ability to maintain a body temperature in the normal range will improve Outcome: Progressing   Problem: Respiratory: Goal: Ability to maintain adequate ventilation will improve Outcome: Progressing Goal: Ability to maintain a clear airway will improve Outcome: Progressing   Problem: Education: Goal: Knowledge of the prescribed therapeutic regimen will improve Outcome: Progressing Goal: Ability to verbalize activity precautions or restrictions will improve Outcome: Progressing Goal: Understanding of discharge needs will improve Outcome: Progressing   Problem: Activity: Goal: Ability to perform//tolerate increased activity and mobilize with assistive devices will improve Outcome: Progressing   Problem: Clinical Measurements: Goal: Postoperative complications will be avoided or minimized Outcome: Progressing   Problem: Self-Care: Goal: Ability to meet self-care needs will improve Outcome: Progressing   Problem: Self-Concept: Goal: Ability to maintain and perform role responsibilities to the fullest extent possible will improve Outcome: Progressing   Problem: Pain Management: Goal: Pain level will decrease with appropriate interventions Outcome: Progressing   Problem: Skin Integrity: Goal: Demonstration of wound healing without infection will improve Outcome: Progressing

## 2023-04-09 NOTE — [place] FL2 (Signed)
[place] MEDICAID FL2 LEVEL OF CARE FORM     IDENTIFICATION  Patient Name: Alan Tapia. Birthdate: 09/19/1975 Sex: male Admission Date (Current Location): 04/06/2023  [facility] and [place] Number:  Producer, television/film/video and Address:  [facility], [address], [place], [name] [address]      Provider Number: [id number]  Attending Physician Name and Address:  Susa Griffins, MD  Relative Name and Phone Number:       Current Level of Care: Hospital Recommended Level of Care: Skilled Nursing Facility Prior Approval Number:    Date Approved/Denied:   PASRR Number: [id number]  Discharge Plan: SNF    Current Diagnoses: Patient Active Problem List   Diagnosis Date Noted   Subacute osteomyelitis of right foot (HCC) 04/08/2023   Cutaneous abscess of right foot 04/08/2023   Subacute osteomyelitis, right ankle and foot (HCC) 04/08/2023   Acute osteomyelitis of right foot (HCC) 04/07/2023   Acute on chronic anemia 04/07/2023   Metabolic acidosis 04/07/2023   CAP (community acquired pneumonia) 04/07/2023   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) 04/07/2023   Insulin dependent type 2 diabetes mellitus (HCC) 04/07/2023   CKD (chronic kidney disease), stage II 04/07/2023   Diarrhea 04/07/2023   Hx of left BKA (HCC) 04/07/2023   PVD (peripheral vascular disease) (HCC) 04/07/2023   Constipation 09/11/2022   Type 2 diabetes mellitus with hyperglycemia, with long-term current use of insulin (HCC) 09/09/2022   Depressed left ventricular ejection fraction 09/05/2022   Azotemia 09/04/2022   Phantom pain 09/02/2022   Adjustment disorder 08/29/2022   Acute systolic CHF (congestive heart failure) (HCC) 08/27/2022   Essential hypertension 08/27/2022   Dilated cardiomyopathy (HCC) 08/26/2022   Below-knee amputation of left lower extremity (HCC) 08/22/2022   Lymphedema 08/15/2022   Severe protein-calorie malnutrition  (HCC) 08/15/2022   Sepsis (HCC) 08/15/2022   Subacute osteomyelitis, left ankle and foot (HCC) 08/13/2022   Hypokalemia 08/13/2022   Diabetic foot infection (HCC) 06/06/2022   Uncontrolled type 2 diabetes mellitus with hyperglycemia, with long-term current use of insulin (HCC) 06/06/2022   Hypertensive urgency 06/06/2022    Orientation RESPIRATION BLADDER Height & Weight     Self, Situation, Place, Time  Normal External catheter, Incontinent Weight: (!) 314 lb 13.1 oz (142.8 kg) Height:  6\' 4"  (193 cm)  BEHAVIORAL SYMPTOMS/MOOD NEUROLOGICAL BOWEL NUTRITION STATUS      Continent Diet (please see discharge summary)  AMBULATORY STATUS COMMUNICATION OF NEEDS Skin     Verbally Surgical wounds (negative wound therapy Rt Distal , wound incision Rt Heel, Closed incision RT Knee)                       Personal Care Assistance Level of Assistance  Bathing, Feeding, Dressing Bathing Assistance: Maximum assistance Feeding assistance: Independent Dressing Assistance: Limited assistance     Functional Limitations Info  Sight, Hearing, Speech Sight Info: Adequate (glasses) Hearing Info: Adequate Speech Info: Adequate    SPECIAL CARE FACTORS FREQUENCY  PT (By licensed PT), OT (By licensed OT)     PT Frequency: 5x per week OT Frequency: 5x per week            Contractures Contractures Info: Not present    Additional Factors Info  Code Status, Allergies Code Status Info: FULL Allergies Info: Fish Allergy, Benadryl,cefepime,Coreg,Vancomycin           Current Medications (04/09/2023):  This is the current hospital active  medication list Current Facility-Administered Medications  Medication Dose Route Frequency Provider Last Rate Last Admin   0.9 %  sodium chloride infusion (Manually program via Guardrails IV Fluids)   Intravenous Once Nadara Mustard, MD       0.9 %  sodium chloride infusion (Manually program via Guardrails IV Fluids)   Intravenous Once Nadara Mustard, MD        0.9 %  sodium chloride infusion (Manually program via Guardrails IV Fluids)   Intravenous Once Nadara Mustard, MD       0.9 %  sodium chloride infusion (Manually program via Guardrails IV Fluids)   Intravenous Once Nadara Mustard, MD       0.9 %  sodium chloride infusion   Intravenous Continuous Nadara Mustard, MD 10 mL/hr at 04/09/23 0800 Infusion Verify at 04/09/23 0800   acetaminophen (TYLENOL) tablet 325-650 mg  325-650 mg Oral Q6H PRN Nadara Mustard, MD   650 mg at 04/09/23 1012   ascorbic acid (VITAMIN C) tablet 1,000 mg  1,000 mg Oral Daily Nadara Mustard, MD   1,000 mg at 04/09/23 1012   doxycycline (VIBRAMYCIN) 100 mg in dextrose 5 % 250 mL IVPB  100 mg Intravenous Q12H Nadara Mustard, MD       feeding supplement (ENSURE ENLIVE / ENSURE PLUS) liquid 237 mL  237 mL Oral BID BM Nadara Mustard, MD   237 mL at 04/09/23 1021   guaiFENesin-dextromethorphan (ROBITUSSIN DM) 100-10 MG/5ML syrup 15 mL  15 mL Oral Q4H PRN Nadara Mustard, MD       hydrALAZINE (APRESOLINE) injection 5 mg  5 mg Intravenous Q20 Min PRN Nadara Mustard, MD       HYDROmorphone (DILAUDID) injection 0.5-1 mg  0.5-1 mg Intravenous Q4H PRN Nadara Mustard, MD   0.5 mg at 04/09/23 0831   labetalol (NORMODYNE) injection 10 mg  10 mg Intravenous Q10 min PRN Nadara Mustard, MD       magnesium sulfate IVPB 2 g 50 mL  2 g Intravenous Daily PRN Nadara Mustard, MD       metoprolol tartrate (LOPRESSOR) injection 2-5 mg  2-5 mg Intravenous Q2H PRN Nadara Mustard, MD       morphine (PF) 2 MG/ML injection 1 mg  1 mg Intravenous Q2H PRN Nadara Mustard, MD   1 mg at 04/08/23 5784   nutrition supplement (JUVEN) (JUVEN) powder packet 1 packet  1 packet Oral BID BM Nadara Mustard, MD   1 packet at 04/09/23 1012   ondansetron (ZOFRAN) tablet 4 mg  4 mg Oral Q6H PRN Nadara Mustard, MD       Or   ondansetron [facility]) injection 4 mg  4 mg Intravenous Q6H PRN Nadara Mustard, MD   4 mg at 04/09/23 1310   Oral care mouth rinse  15 mL Mouth Rinse  PRN Nadara Mustard, MD       oxyCODONE (Oxy IR/ROXICODONE) immediate release tablet 10-15 mg  10-15 mg Oral Q4H PRN Nadara Mustard, MD   15 mg at 04/09/23 1214   oxyCODONE (Oxy IR/ROXICODONE) immediate release tablet 5-10 mg  5-10 mg Oral Q4H PRN Nadara Mustard, MD   10 mg at 04/09/23 0731   phenol (CHLORASEPTIC) mouth spray 1 spray  1 spray Mouth/Throat PRN Nadara Mustard, MD       piperacillin-tazobactam (ZOSYN) IVPB 3.375 g  3.375 g Intravenous Q8H Susa Griffins, MD 12.5  mL/hr at 04/09/23 0823 3.375 g at 04/09/23 1610   potassium chloride SA (KLOR-CON M) CR tablet 20-40 mEq  20-40 mEq Oral Daily PRN Nadara Mustard, MD       sodium chloride flush (NS) 0.9 % injection 3 mL  3 mL Intravenous Q12H Nadara Mustard, MD   3 mL at 04/09/23 1026   zinc sulfate (50mg  elemental zinc) capsule 220 mg  220 mg Oral Daily Nadara Mustard, MD   220 mg at 04/09/23 1012     Discharge Medications: Please see discharge summary for a list of discharge medications.  Relevant Imaging Results:  Relevant Lab Results:   Additional Information SSN [id number]  Eduard Roux, LCSW

## 2023-04-10 ENCOUNTER — Inpatient Hospital Stay (HOSPITAL_COMMUNITY): Payer: Medicaid Other

## 2023-04-10 DIAGNOSIS — M86171 Other acute osteomyelitis, right ankle and foot: Secondary | ICD-10-CM | POA: Diagnosis not present

## 2023-04-10 LAB — COMPREHENSIVE METABOLIC PANEL
ALT: 12 U/L (ref 0–44)
AST: 12 U/L — ABNORMAL LOW (ref 15–41)
Albumin: <1.5 g/dL — ABNORMAL LOW (ref 3.5–5.0)
Alkaline Phosphatase: 244 U/L — ABNORMAL HIGH (ref 38–126)
Anion gap: 5 (ref 5–15)
BUN: 12 mg/dL (ref 6–20)
CO2: 24 mmol/L (ref 22–32)
Calcium: 6.7 mg/dL — ABNORMAL LOW (ref 8.9–10.3)
Chloride: 106 mmol/L (ref 98–111)
Creatinine, Ser: 1.38 mg/dL — ABNORMAL HIGH (ref 0.61–1.24)
GFR, Estimated: >60 mL/min (ref >60)
Glucose, Bld: 108 mg/dL — ABNORMAL HIGH (ref 70–99)
Potassium: 3.5 mmol/L (ref 3.5–5.1)
Sodium: 135 mmol/L (ref 135–145)
Total Bilirubin: 0.6 mg/dL (ref <1.2)
Total Protein: 5.4 g/dL — ABNORMAL LOW (ref 6.5–8.1)

## 2023-04-10 LAB — CBC
HCT: 24.5 % — ABNORMAL LOW (ref 39.0–52.0)
Hemoglobin: 7.5 g/dL — ABNORMAL LOW (ref 13.0–17.0)
MCH: 23.7 pg — ABNORMAL LOW (ref 26.0–34.0)
MCHC: 30.6 g/dL (ref 30.0–36.0)
MCV: 77.5 fL — ABNORMAL LOW (ref 80.0–100.0)
Platelets: 417 10*3/uL — ABNORMAL HIGH (ref 150–400)
RBC: 3.16 MIL/uL — ABNORMAL LOW (ref 4.22–5.81)
RDW: 19.1 % — ABNORMAL HIGH (ref 11.5–15.5)
WBC: 9.9 10*3/uL (ref 4.0–10.5)
nRBC: 0.2 % (ref 0.0–0.2)

## 2023-04-10 LAB — IRON AND TIBC: Iron: 34 ug/dL — ABNORMAL LOW (ref 45–182)

## 2023-04-10 LAB — GLUCOSE, CAPILLARY
Glucose-Capillary: 100 mg/dL — ABNORMAL HIGH (ref 70–99)
Glucose-Capillary: 101 mg/dL — ABNORMAL HIGH (ref 70–99)
Glucose-Capillary: 101 mg/dL — ABNORMAL HIGH (ref 70–99)
Glucose-Capillary: 103 mg/dL — ABNORMAL HIGH (ref 70–99)
Glucose-Capillary: 114 mg/dL — ABNORMAL HIGH (ref 70–99)
Glucose-Capillary: 128 mg/dL — ABNORMAL HIGH (ref 70–99)
Glucose-Capillary: 95 mg/dL (ref 70–99)

## 2023-04-10 LAB — PREALBUMIN: Prealbumin: 8 mg/dL — ABNORMAL LOW (ref 18–38)

## 2023-04-10 LAB — RETICULOCYTES
Immature Retic Fract: 32.9 % — ABNORMAL HIGH (ref 2.3–15.9)
RBC.: 3.49 MIL/uL — ABNORMAL LOW (ref 4.22–5.81)
Retic Count, Absolute: 78.9 10*3/uL (ref 19.0–186.0)
Retic Ct Pct: 2.3 % (ref 0.4–3.1)

## 2023-04-10 LAB — HEMOGLOBIN AND HEMATOCRIT, BLOOD
HCT: 24.1 % — ABNORMAL LOW (ref 39.0–52.0)
HCT: 24.8 % — ABNORMAL LOW (ref 39.0–52.0)
Hemoglobin: 7.5 g/dL — ABNORMAL LOW (ref 13.0–17.0)
Hemoglobin: 7.6 g/dL — ABNORMAL LOW (ref 13.0–17.0)

## 2023-04-10 LAB — FOLATE: Folate: 5.8 ng/mL — ABNORMAL LOW (ref >5.9)

## 2023-04-10 LAB — VITAMIN B12: Vitamin B-12: 3052 pg/mL — ABNORMAL HIGH (ref 180–914)

## 2023-04-10 NOTE — TOC Progression Note (Addendum)
Transition of Care (TOC) - Progression Note    Patient Details  Name: Alan Tapia. MRN: [id number] Date of Birth: 03-27-1976  Transition of Care [facility]) CM/SW Contact  Carley Hammed, LCSW Phone Number: 04/10/2023, 11:55 AM  Clinical Narrative:     CSW reviewed chart and noted pt currently does not have PT recs, will review when available. Pt currently has Medicaid [employer]. Only considering bed offer is [facility]. [facility] noting they will need to determine if they can accept pt, will send to business office. Pt will need to be considered disabled, as well as other criteria they will confirm. They will follow back up with decision. TOC will continue to follow.    12:20. [facility] reviewed pt and noted they cannot accept due to the payor source. Pt would need PT at facility and it does not cover. Per facility, pt also unlikely to get [facility] services with this coverage after SNF stay. CSW to update team to current barriers.  Expected Discharge Plan: Skilled Nursing Facility Barriers to Discharge: English as a second language teacher, Continued Medical Work up, SNF Pending bed offer  Expected Discharge Plan and Services In-house Referral: Clinical Social Work     Living arrangements for the past 2 months: Single Family Home                                       Social Determinants of Health (SDOH) Interventions SDOH Screenings   Food Insecurity: No Food Insecurity (04/07/2023)  Housing: Medium Risk (04/07/2023)  Transportation Needs: No Transportation Needs (04/07/2023)  Recent Concern: Transportation Needs - Unmet Transportation Needs (02/17/2023)   Received from [facility]  Utilities: At Risk (04/07/2023)  Alcohol Screen: Low Risk  (08/20/2022)  Financial Resource Strain: Patient Unable To Answer (08/20/2022)  Tobacco Use: Low Risk  (04/08/2023)    Readmission Risk Interventions    04/07/2023    2:34 PM  Readmission Risk Prevention Plan  Transportation Screening  Complete  PCP or Specialist Appt within 3-5 Days Complete  HRI or Home Care Consult Complete  Social Work Consult for Recovery Care Planning/Counseling Complete  Palliative Care Screening Not Applicable  Medication Review Oceanographer) Complete

## 2023-04-10 NOTE — Plan of Care (Signed)
  Problem: Fluid Volume: Goal: Hemodynamic stability will improve Outcome: Progressing   Problem: Clinical Measurements: Goal: Diagnostic test results will improve Outcome: Progressing Goal: Signs and symptoms of infection will decrease Outcome: Progressing   Problem: Respiratory: Goal: Ability to maintain adequate ventilation will improve Outcome: Progressing   Problem: Education: Goal: Knowledge of General Education information will improve Description: Including pain rating scale, medication(s)/side effects and non-pharmacologic comfort measures Outcome: Progressing   Problem: Health Behavior/Discharge Planning: Goal: Ability to manage health-related needs will improve Outcome: Progressing   Problem: Clinical Measurements: Goal: Ability to maintain clinical measurements within normal limits will improve Outcome: Progressing Goal: Will remain free from infection Outcome: Progressing

## 2023-04-10 NOTE — Progress Notes (Signed)
[facility] PROGRESS NOTE  Alan Tapia. OAC:[id number] DOB: 1975-12-26 DOA: 04/06/2023 PCP: Patient, No Pcp Per  Status is: Inpatient Remains inpatient appropriate because: Necrotic.,  Surgery  Barriers to discharge: Social: Bilateral amputation Clinical:  Level of care: Progressive   Code Status: Full code Family Communication: Wife at bedside DVT prophylaxis:  COVID vaccination status:   Subjective: Per nursing staff patient is eating poorly.  No further episodes of diarrhea.  Taken off of enteric precautions.  Complaining of nausea, had an episode of vomiting yesterday.  Per nursing staff no drainage from the wound  Objective: Vitals:   04/10/23 0503 04/10/23 0842  BP: 139/76 (!) 148/82  Pulse: 88 90  Resp: 18 17  Temp: 98 F (36.7 C) 98.2 F (36.8 C)  SpO2: 98% 100%    Intake/Output Summary (Last 24 hours) at 04/10/2023 0944 Last data filed at 04/10/2023 0500 Gross per 24 hour  Intake 686.38 ml  Output 700 ml  Net -13.62 ml   Filed Weights   04/08/23 1244 04/09/23 0403 04/10/23 0503  Weight: (!) 142.9 kg (!) 142.8 kg (!) 140.1 kg    Exam:  Constitutional: NAD, calm, comfortable Eyes: PERRL, lids and conjunctivae normal ENMT: Mucous membranes are moist. Posterior pharynx clear of any exudate or lesions.Normal dentition.  Neck: normal, supple, no masses, no thyromegaly Respiratory: clear to auscultation bilaterally, no wheezing, no crackles. Normal respiratory effort. No accessory muscle use.  Cardiovascular: Regular rate and rhythm, no murmurs / rubs / gallops. No extremity edema. 2+ pedal pulses. No carotid bruits.  Abdomen: no tenderness, no masses palpated. No hepatosplenomegaly. Bowel sounds positive.  Musculoskeletal: no clubbing / cyanosis. No joint deformity upper and lower extremities. Good ROM, no contractures. Normal muscle tone.  Bilateral BKA.  Right BKA and the dressing Skin: no rashes, lesions, ulcers. No induration Neurologic:  CN 2-12 grossly intact. Sensation intact, DTR normal. Strength 5/5 x all 4 extremities.  Psychiatric: Normal judgment and insight. Alert and oriented x 3. Normal mood.    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  Patient was [facility] to [facility] in August 2020 for when had left-sided foot infection since then lost follow-up with the primary care physician, last follow-up with wound care clinic.  Also has not been receiving his blood pressure medications or insulin for the last 4 months.  Patient comes to the emergency department with complaints of nausea, vomiting, diarrhea for the last 1 month.  Orthopedic surgery is consulted.  Going for amputation of the right leg BKA.  Patient is started on broad-spectrum antibiotics with Zosyn, vancomycin.  Patient was found to have low hemoglobin of 5.9, transfused 4 units of packed RBC with improvement of the hemoglobin to 8.8.  Nonhealing right foot ulcer with necrotic -S/p right BKA 11/27 -Continue with wound VAC  Sepsis secondary to necrotic right foot ulcers, present on admission -Continue rectal vancomycin, cefepime -Admitted with a WBC count of 23,000, tachycardia, elevated lactic acid of 2.8 -WBC count trending down to 13.1-9.9  Nausea, vomiting -Discontinue IV narcotics -Continue with antinausea medications -Get KUB.  Anemia multifactorial -No anemia workup on admission -Will get the anemia workup in 1 week as patient received 4 units of PRBC -  Get stool occult -Closely follow-up with CBC. -Hemoglobin trended down to  7.5-6.3-5.9-8.5-7.5  Diabetes mellitus poorly controlled with hyperglycemia -Get hemoglobin A1c-6.8 -Continue with Lantus 10 units daily -Follow-up on sliding scale insulin.  Severe protein calorie malnutrition -Patient has albumin of less than 1.5 -Prealbumin level.  Acute on chronic kidney failure stage IIIa -Secondary to poorly controlled diabetes mellitus, noncompliance with medications for hypertension. -Get urinalysis to rule out any infection -Check CPK level  Peripheral vascular disease -S/p left BKA, s/p BKA  Hypertension accelerated -Keep her on Coreg 25 mg twice daily Data Reviewed: Basic Metabolic Panel: Recent Labs  Lab 04/06/23 2220 04/07/23 0540 04/08/23 0707 04/09/23 0619 04/10/23 0337  NA 133* 135 136 133* 135  K 3.5 3.5 3.6 4.0 3.5  CL 106 107 107 105 106  CO2 21* 21* 21* 20* 24  GLUCOSE 113* 106* 103* 84 108*  BUN 13 13 12 11 12   CREATININE 1.40* 1.38* 1.52* 1.51* 1.38*  CALCIUM 6.7* 6.9* 7.2* 6.9* 6.7*   Liver Function Tests: Recent Labs  Lab 04/06/23 2220 04/07/23 0540 04/08/23 0707 04/09/23 0619 04/10/23 0337  AST 17 12* 9* 15 12*  ALT 13 10 12 12 12   ALKPHOS 299* 261* 215* 203* 244*  BILITOT 0.5 0.7 0.8 0.8 0.6  PROT 6.4* 5.9* 5.8* 5.7* 5.4*  ALBUMIN <1.5* <1.5* <1.5* <1.5* <1.5*   No results for input(s): "LIPASE", "AMYLASE" in the last 168 hours. No results for input(s): "AMMONIA" in the last 168 hours. CBC: Recent Labs  Lab 04/06/23 2220 04/07/23 0540 04/07/23 0842 04/08/23 0707 04/09/23 0619 04/09/23 1139 04/09/23 1628 04/09/23 2108 04/10/23 0337 04/10/23 0338  WBC 23.3* 18.2*  --  13.9* 13.1*  --   --   --  9.9  --   NEUTROABS 19.8*  --   --   --   --   --   --   --   --   --   HGB 7.5* 6.3*   < > 8.8* 8.0* 8.3* 7.8* 7.9* 7.5* 7.5*  HCT 25.4* 22.5*   < > 28.0* 25.2* 27.6* 26.0* 25.5* 24.5* 24.1*  MCV 74.3* 76.5*  --  76.3* 75.9*  --   --   --  77.5*  --   PLT 516* 461*  --  413* 416*  --   --   --  417*  --    < >  = values in this interval not displayed.   Cardiac Enzymes: No results for input(s): "CKTOTAL", "CKMB", "CKMBINDEX", "TROPONINI" in the last 168 hours. BNP (last 3 results) Recent Labs    12/14/22 0839  BNP 197.3*    ProBNP (last 3 results) No results for input(s): "PROBNP" in the last 8760 hours.  CBG: Recent Labs  Lab 04/09/23 1542 04/09/23 2008 04/10/23 0055 04/10/23 0506 04/10/23 0840  GLUCAP 108* 112* 114* 95 100*    Recent Results (from the past 240 hour(s))  Culture, blood (Routine x 2)     Status: None (Preliminary result)   Collection Time: 04/06/23 10:20 PM   Specimen: BLOOD  Result Value Ref Range Status   Specimen Description   Final    BLOOD LEFT ANTECUBITAL Performed at [facility], [address] [place], [name] [address]    Special Requests   Final    BOTTLES DRAWN AEROBIC AND ANAEROBIC Blood Culture adequate volume Performed at [facility], [address], [place], [name] [address]    Culture   Final  NO GROWTH 3 DAYS Performed at [facility], [address], [place], [name] [address]    Report Status PENDING  Incomplete  Culture, blood (Routine x 2)     Status: None (Preliminary result)   Collection Time: 04/06/23 10:32 PM   Specimen: BLOOD  Result Value Ref Range Status   Specimen Description   Final    BLOOD BLOOD LEFT FOREARM Performed at [facility], [address] [place], [name] [address]    Special Requests   Final    BOTTLES DRAWN AEROBIC AND ANAEROBIC Blood Culture results may not be optimal due to an inadequate volume of blood received in culture bottles Performed at [facility], [address] [place], [name] [address]    Culture   Final    NO GROWTH 3 DAYS Performed at [facility], [address] [place], [name] [address]    Report Status PENDING  Incomplete  Respiratory (~20 pathogens) panel by PCR     Status: None    Collection Time: 04/07/23  1:04 AM   Specimen: Nasopharyngeal Swab; Respiratory  Result Value Ref Range Status   Adenovirus NOT DETECTED NOT DETECTED Final   Coronavirus 229E NOT DETECTED NOT DETECTED Final    Comment: (NOTE) The Coronavirus on the Respiratory Panel, DOES NOT test for the novel  Coronavirus (2019 nCoV)    Coronavirus HKU1 NOT DETECTED NOT DETECTED Final   Coronavirus NL63 NOT DETECTED NOT DETECTED Final   Coronavirus OC43 NOT DETECTED NOT DETECTED Final   Metapneumovirus NOT DETECTED NOT DETECTED Final   Rhinovirus / Enterovirus NOT DETECTED NOT DETECTED Final   Influenza A NOT DETECTED NOT DETECTED Final   Influenza B NOT DETECTED NOT DETECTED Final   Parainfluenza Virus 1 NOT DETECTED NOT DETECTED Final   Parainfluenza Virus 2 NOT DETECTED NOT DETECTED Final   Parainfluenza Virus 3 NOT DETECTED NOT DETECTED Final   Parainfluenza Virus 4 NOT DETECTED NOT DETECTED Final   Respiratory Syncytial Virus NOT DETECTED NOT DETECTED Final   Bordetella pertussis NOT DETECTED NOT DETECTED Final   Bordetella Parapertussis NOT DETECTED NOT DETECTED Final   Chlamydophila pneumoniae NOT DETECTED NOT DETECTED Final   Mycoplasma pneumoniae NOT DETECTED NOT DETECTED Final    Comment: Performed at [facility], [address] [place], [name] [address]  Culture, blood (routine x 2) Call MD if unable to obtain prior to antibiotics being given     Status: None (Preliminary result)   Collection Time: 04/07/23  5:40 AM   Specimen: BLOOD RIGHT ARM  Result Value Ref Range Status   Specimen Description   Final    BLOOD RIGHT ARM Performed at [facility], [address], [place], [name] [address]    Special Requests   Final    BOTTLES DRAWN AEROBIC ONLY Blood Culture adequate volume Performed at [facility], [address] [place], [name] [address]    Culture   Final    NO GROWTH 2 DAYS Performed at [facility], [address],  [place], [name] [address]    Report Status PENDING  Incomplete  Culture, blood (routine x 2) Call MD if unable to obtain prior to antibiotics being given     Status: None (Preliminary result)   Collection Time: 04/07/23  5:40 AM   Specimen: BLOOD RIGHT HAND  Result Value Ref Range Status   Specimen Description   Final    BLOOD RIGHT  HAND Performed at [facility], [address] [place], [name] [address]    Special Requests   Final    BOTTLES DRAWN AEROBIC ONLY Blood Culture adequate volume Performed at [facility], [address], [place], [name] [address]    Culture   Final    NO GROWTH 2 DAYS Performed at [facility], [address], [place], [name] [address]    Report Status PENDING  Incomplete  Surgical pcr screen     Status: None   Collection Time: 04/08/23  2:10 AM   Specimen: Nasal Mucosa; Nasal Swab  Result Value Ref Range Status   MRSA, PCR NEGATIVE NEGATIVE Final   Staphylococcus aureus NEGATIVE NEGATIVE Final    Comment: (NOTE) The Xpert SA Assay (FDA approved for NASAL specimens in patients 25 years of age and older), is one component of a comprehensive surveillance program. It is not intended to diagnose infection nor to guide or monitor treatment. Performed at [facility], [address], [place], [name] [address]      Studies: No results found.  Scheduled Meds:  sodium chloride   Intravenous Once   sodium chloride   Intravenous Once   sodium chloride   Intravenous Once   sodium chloride   Intravenous Once   vitamin C  1,000 mg Oral Daily   feeding supplement  237 mL Oral BID BM   nutrition supplement (JUVEN)  1 packet Oral BID BM   sodium chloride flush  3 mL Intravenous Q12H   zinc sulfate (50mg  elemental zinc)  220 mg Oral Daily   Continuous Infusions:  sodium chloride 10 mL/hr at 04/10/23 0500   magnesium sulfate bolus IVPB      Principal Problem:   Sepsis (HCC) Active Problems:   Acute osteomyelitis  of right foot (HCC)   Acute on chronic anemia   Metabolic acidosis   CAP (community acquired pneumonia)   Diarrhea   Essential hypertension   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)   Insulin dependent type 2 diabetes mellitus (HCC)   CKD (chronic kidney disease), stage II   Hx of left BKA (HCC)   PVD (peripheral vascular disease) (HCC)   Subacute osteomyelitis of right foot (HCC)   Cutaneous abscess of right foot   Subacute osteomyelitis, right ankle and foot (HCC)  Consultants: Orthopedic surgery  Procedures: Right BKA  Antibiotics: Vancomycin, levofloxacin 11/25  Time spent: 40 minutes    Elayne Gruver MD  [facility] 7 am - 330 pm/M-F for direct patient care and secure chat Please refer to Amion for contact info 3  days

## 2023-04-10 NOTE — Evaluation (Signed)
Occupational Therapy Evaluation Patient Details Name: Alan Tapia. MRN: [id number] DOB: April 21, 1976 Today's Date: 04/10/2023   History of Present Illness The pt is a 47 yo male presenting 11/25 with nausea, vomiting, and fever as well as R foot wound. 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   Clinical Impression   At baseline, pt completes ADLs Independent to Max assist, functional transfers with Mod I, and functional mobility from wheelchair level with Mod I. Pt now presents with decreased activity tolerance, generalized B UE weakness, decreased balance during functional tasks, pain affecting functional level, and decreased safety and independence with functional tasks. Pt currently demonstrates ability to complete UB ADLs with Set up to Mod assist, LB ADLs with Total assist +2 from bed level, and bed mobility with Max assist of +1 to +2. Pt unable to progress to functional transfers this session. Pt will benefit from acute skilled OT services to address deficits outlined below, decrease caregiver burden, and increase safety and independence with functional tasks. Post acute discharge, pt will benefit from intensive inpatient skilled rehab services < 3 hours per day to maximize rehab potential.       If plan is discharge home, recommend the following: Two people to help with walking and/or transfers;Two people to help with bathing/dressing/bathroom;Assistance with cooking/housework;Assist for transportation;Help with stairs or ramp for entrance (Set up for self feeding)    Functional Status Assessment  Patient has had a recent decline in their functional status and demonstrates the ability to make significant improvements in function in a reasonable and predictable amount of time.  Equipment Recommendations  Other (comment)  (defer to next level of care)    Recommendations for Other Services       Precautions / Restrictions Precautions Precautions: Fall Restrictions Weight Bearing Restrictions: No Other Position/Activity Restrictions: bil BKA      Mobility Bed Mobility Overal bed mobility: Needs Assistance Bed Mobility: Rolling, Supine to Sit, Sit to Supine Rolling: Max assist   Supine to sit: Max assist, +2 for physical assistance Sit to supine: Max assist, +2 for physical assistance   General bed mobility comments: Pt was able to provide less than 25% assist with verbal cues for sequencing requiring assistance to get LE to EOB and to get trunk to mid line. Pt requires assist with LE to get to supine and with trunk to prevent falling posteriorly    Transfers                   General transfer comment: Unable to progress today. Pt felt faint sitting EOB. Unable to get sitting BP 148/85 in supine after sitting.      Balance Overall balance assessment: Needs assistance Sitting-balance support: Bilateral upper extremity supported Sitting balance-Leahy Scale: Poor Sitting balance - Comments: CGA intermittent SBA                                   ADL either performed or assessed with clinical judgement   ADL Overall ADL's : Needs assistance/impaired Eating/Feeding: Set up;Bed level   Grooming: Set up;Bed level   Upper Body Bathing: Moderate assistance;Bed level   Lower Body Bathing: Total assistance;+2 for physical assistance;Bed level   Upper Body Dressing : Minimal assistance;Bed level   Lower  Body Dressing: Total assistance;+2 for physical assistance;Bed level     Toilet Transfer Details (indicate cue type and reason): unable to progress to transfer this session; OT to address during future session Toileting- Clothing Manipulation and Hygiene: Total assistance;+2 for physical assistance;Bed level         General ADL Comments: Pt with decreased activity with  pt fatiguing quickly during session.     Vision Baseline Vision/History: 1 Wears glasses (progressives) Ability to See in Adequate Light: 0 Adequate (with glasses) Patient Visual Report: No change from baseline       Perception         Praxis         Pertinent Vitals/Pain Pain Assessment Pain Assessment: 0-10 Pain Score: 9  Pain Location: Surgical site Pain Descriptors / Indicators: Aching, Grimacing, Guarding Pain Intervention(s): Limited activity within patient's tolerance, Monitored during session, Repositioned     Extremity/Trunk Assessment Upper Extremity Assessment Upper Extremity Assessment: Right hand dominant;Generalized weakness   Lower Extremity Assessment Lower Extremity Assessment: Defer to PT evaluation RLE Deficits / Details: less than 3/5 strength recent BKA LLE Deficits / Details: 3/5 strength unable to take resistance limited mobility in hip abd/add.   Cervical / Trunk Assessment Cervical / Trunk Assessment: Normal   Communication Communication Communication: No apparent difficulties   Cognition Arousal: Alert Behavior During Therapy: WFL for tasks assessed/performed Overall Cognitive Status: Within Functional Limits for tasks assessed                                 General Comments: AAOx4 and able to follow multi-step commands consistently. Pt requires max encouragment for participation.     General Comments  Pt reporting dizziness sitting EOB and unable to tolerate sitting EOB long enough to obtain sitting BP. BP noted to be slightly elevated from bed level with RN aware. All other VSS on RA troughout session. Pt's girlfriend present at end of session.    Exercises     Shoulder Instructions      Home Living Family/patient expects to be discharged to:: Private residence Living Arrangements: Spouse/significant other Available Help at Discharge: Family;Available 24 hours/day Type of Home: Other(Comment) Home Access: Stairs to  enter Entrance Stairs-Number of Steps: 2 Entrance Stairs-Rails: Right;Left Home Layout: Two level;1/2 bath on main level Alternate Level Stairs-Number of Steps: 13   Bathroom Shower/Tub: Other (comment);Tub/shower unit (Pt reports he bathes from bed level due to being unable to access tub/shower upstairs.)   Bathroom Toilet: Standard     Home Equipment: Cane - Programmer, applications (2 wheels);BSC/3in1;Hospital bed;Wheelchair - manual (drop-arm BSC)          Prior Functioning/Environment Prior Level of Function : Needs assist             Mobility Comments: largely Mod I from wheelchair level; reports he transfers from bed to w/c and to [facility] with Mod I ADLs Comments: Independent to Set up for UB ADLs, Mod to Max assist for LB bathing and dressing; able to toilet with Mod I to Min assist with drop-arm BSC        OT Problem List: Decreased strength;Decreased activity tolerance;Impaired balance (sitting and/or standing);Decreased knowledge of use of DME or AE;Pain      OT Treatment/Interventions: Self-care/ADL training;Therapeutic exercise;Energy conservation;DME and/or AE instruction;Therapeutic activities;Patient/family education;Balance training    OT Goals(Current goals can be found in the care plan section) Acute Rehab OT Goals Patient Stated Goal: to get  stronger and return home OT Goal Formulation: With patient Time For Goal Achievement: 04/24/23 Potential to Achieve Goals: Good ADL Goals Pt Will Perform Grooming: with supervision;sitting (sitting EOB for 3 or more minutes with Good balance) Pt Will Perform Upper Body Bathing: with contact guard assist;sitting (with adaptive equipment as needed) Pt Will Perform Upper Body Dressing: with modified independence;sitting Pt Will Transfer to Toilet: with mod assist;with transfer board (with drop-arm bedside commode) Pt Will Perform Toileting - Clothing Manipulation and hygiene: with min assist;sitting/lateral leans Pt/caregiver  will Perform Home Exercise Program: Increased strength;Both right and left upper extremity;With theraband;With theraputty;Independently;With written HEP provided (increased activity tolerance)  OT Frequency: Min 1X/week    Co-evaluation PT/OT/SLP Co-Evaluation/Treatment: Yes Reason for Co-Treatment: Complexity of the patient's impairments (multi-system involvement);Necessary to address cognition/behavior during functional activity;For patient/therapist safety PT goals addressed during session: Mobility/safety with mobility;Balance OT goals addressed during session: ADL's and self-care;Strengthening/ROM      AM-PAC OT "6 Clicks" Daily Activity     Outcome Measure Help from another person eating meals?: A Little Help from another person taking care of personal grooming?: A Little Help from another person toileting, which includes using toliet, bedpan, or urinal?: Total Help from another person bathing (including washing, rinsing, drying)?: A Lot Help from another person to put on and taking off regular upper body clothing?: A Little Help from another person to put on and taking off regular lower body clothing?: Total 6 Click Score: 13   End of Session Nurse Communication: Mobility status;Other (comment) (Pt reporting dizziness sitting EOB. Pt with mildly elevated BP from bed level. Pt required encouragment for participation.)  Activity Tolerance: Patient tolerated treatment well;Patient limited by pain;Patient limited by fatigue Patient left: in bed;with call bell/phone within reach;with bed alarm set;with family/visitor present  OT Visit Diagnosis: Other abnormalities of gait and mobility (R26.89);Muscle weakness (generalized) (M62.81);Other (comment) (decreased activity tolerance)                Time: 6045-4098 OT Time Calculation (min): 26 min Charges:  OT General Charges $OT Visit: 1 Visit OT Evaluation $OT Eval Low Complexity: 1 Low  [address]" M., OTR/L, MA Acute  Rehab [phone]   Lendon Colonel 04/10/2023, 1:46 PM

## 2023-04-10 NOTE — Evaluation (Signed)
Physical Therapy Evaluation Patient Details Name: Alan Tapia. MRN: [id number] DOB: Apr 01, 1976 Today's Date: 04/10/2023  History of Present Illness  The pt is a 47 yo male presenting 11/25 with nausea, vomiting, and fever as well as R foot wound. 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  Clinical Impression  Pt is presenting below baseline level of functioning.   Currently pt is Max A for rolling in the bed and total A for supine<>sitting requiring Max A and 2 people. Due pt current functional status, home set up and available assistance at home recommending skilled physical therapy services < 3 hours/day on discharge from acute care hospital setting in order to address strength, balance and functional mobility to decrease risk for immobility, falls, decrease caregiver burden and decrease risk for re-hospitalization.       If plan is discharge home, recommend the following: Two people to help with walking and/or transfers;Assist for transportation;Assistance with cooking/housework;Help with stairs or ramp for entrance     Equipment Recommendations Hoyer lift;Wheelchair cushion (measurements PT);Wheelchair (measurements PT)     Functional Status Assessment Patient has had a recent decline in their functional status and demonstrates the ability to make significant improvements in function in a reasonable and predictable amount of time.     Precautions / Restrictions Precautions Precautions: Fall Restrictions Weight Bearing Restrictions: No Other Position/Activity Restrictions: bil BKA      Mobility  Bed Mobility Overal bed mobility: Needs Assistance Bed Mobility: Rolling, Supine to Sit, Sit to Supine Rolling: Max assist   Supine to sit: Max assist, +2 for physical assistance Sit to supine: Max assist, +2 for  physical assistance   General bed mobility comments: Pt was able to provide less than 25% assist with verbal cues for sequencing requiring assistance to get LE to EOB and to get trunk to mid line. Pt requires assist with LE to get to supine and with trunk to prevent falling posteriorly    Transfers     General transfer comment: Unable to progress today. Pt felt faint sitting EOB. Unable to get sitting BP 148/85 in supine after sitting.         Balance Overall balance assessment: Needs assistance Sitting-balance support: Bilateral upper extremity supported Sitting balance-Leahy Scale: Poor Sitting balance - Comments: CGA intermittent SBA         Pertinent Vitals/Pain Pain Assessment Pain Assessment: 0-10 Pain Score: 9  Pain Location: Surgical site Pain Descriptors / Indicators: Aching, Grimacing, Guarding Pain Intervention(s): Monitored during session, Limited activity within patient's tolerance, Patient requesting pain meds-RN notified    Home Living Family/patient expects to be discharged to:: Private residence Living Arrangements: Spouse/significant other Available Help at Discharge: Family;Available 24 hours/day Type of Home: Other(Comment) Home Access: Stairs to enter Entrance Stairs-Rails: Right;Left Entrance Stairs-Number of Steps: 2 Alternate Level Stairs-Number of Steps: 13 Home Layout: Two level;1/2 bath on main level Home Equipment: Cane - Programmer, applications (2 wheels);BSC/3in1;Hospital bed;Wheelchair - manual (drop-arm BSC)      Prior Function Prior Level of Function : Needs assist             Mobility Comments: largely Mod I from wheelchair level; reports he transfers from bed to w/c and to [facility] with Mod I ADLs Comments: Independent to Set up for UB ADLs, Mod to Max assist for LB bathing dressing;  able to toilet with Mod I to Min assist with drop-arm [facility]     Extremity/Trunk Assessment   Upper Extremity Assessment Upper Extremity Assessment: Defer  to OT evaluation    Lower Extremity Assessment Lower Extremity Assessment: RLE deficits/detail;LLE deficits/detail RLE Deficits / Details: less than 3/5 strength recent BKA LLE Deficits / Details: 3/5 strength unable to take resistance limited mobility in hip abd/add.    Cervical / Trunk Assessment Cervical / Trunk Assessment: Normal  Communication   Communication Communication: No apparent difficulties  Cognition Arousal: Alert Behavior During Therapy: WFL for tasks assessed/performed Overall Cognitive Status: Within Functional Limits for tasks assessed        General Comments General comments (skin integrity, edema, etc.): Pt girlfriend present at end of session.        Assessment/Plan    PT Assessment Patient needs continued PT services  PT Problem List Decreased mobility;Decreased activity tolerance;Decreased balance;Pain;Decreased strength       PT Treatment Interventions DME instruction;Therapeutic exercise;Wheelchair mobility training;Balance training;Functional mobility training;Therapeutic activities;Patient/family education    PT Goals (Current goals can be found in the Care Plan section)  Acute Rehab PT Goals Patient Stated Goal: get bil prosthetics PT Goal Formulation: With patient Time For Goal Achievement: 04/24/23 Potential to Achieve Goals: Good Additional Goals Additional Goal #1: Pt will propel W/C for 50 ft at Min A with Total A for w/c set up    Frequency Min 1X/week     Co-evaluation PT/OT/SLP Co-Evaluation/Treatment: Yes Reason for Co-Treatment: Complexity of the patient's impairments (multi-system involvement);Necessary to address cognition/behavior during functional activity;For patient/therapist safety PT goals addressed during session: Mobility/safety with mobility;Balance         AM-PAC PT "6 Clicks" Mobility  Outcome Measure Help needed turning from your back to your side while in a flat bed without using bedrails?: A Lot Help  needed moving from lying on your back to sitting on the side of a flat bed without using bedrails?: Total Help needed moving to and from a bed to a chair (including a wheelchair)?: Total Help needed standing up from a chair using your arms (e.g., wheelchair or bedside chair)?: Total Help needed to walk in hospital room?: Total Help needed climbing 3-5 steps with a railing? : Total 6 Click Score: 7    End of Session Equipment Utilized During Treatment: Other (comment) (R limb protector) Activity Tolerance: Patient limited by fatigue;Patient tolerated treatment well Patient left: in bed;with call bell/phone within reach;with bed alarm set;with family/visitor present Nurse Communication: Mobility status PT Visit Diagnosis: Other abnormalities of gait and mobility (R26.89)    Time: 1610-9604 PT Time Calculation (min) (ACUTE ONLY): 26 min   Charges:   PT Evaluation $PT Eval Low Complexity: 1 Low   PT General Charges $$ ACUTE PT VISIT: 1 Visit         Harrel Carina, DPT, CLT  Acute Rehabilitation Services Office: [phone] (Secure chat preferred)   Claudia Desanctis 04/10/2023, 1:20 PM

## 2023-04-11 DIAGNOSIS — M86171 Other acute osteomyelitis, right ankle and foot: Secondary | ICD-10-CM | POA: Diagnosis not present

## 2023-04-11 LAB — BPAM RBC
Blood Product Expiration Date: 202412232359
Blood Product Expiration Date: 202412272359
Blood Product Expiration Date: 202412272359
Blood Product Expiration Date: 202412272359
Blood Product Expiration Date: 202412272359
ISSUE DATE / TIME: 202411261129
ISSUE DATE / TIME: 202411261450
ISSUE DATE / TIME: 202411261841
Unit Type and Rh: 5100
Unit Type and Rh: 5100
Unit Type and Rh: 5100
Unit Type and Rh: 5100
Unit Type and Rh: 5100

## 2023-04-11 LAB — TYPE AND SCREEN
ABO/RH(D): O POS
Antibody Screen: NEGATIVE
Unit division: 0
Unit division: 0
Unit division: 0
Unit division: 0
Unit division: 0

## 2023-04-11 LAB — CBC
HCT: 24 % — ABNORMAL LOW (ref 39.0–52.0)
Hemoglobin: 7.2 g/dL — ABNORMAL LOW (ref 13.0–17.0)
MCH: 23.5 pg — ABNORMAL LOW (ref 26.0–34.0)
MCHC: 30 g/dL (ref 30.0–36.0)
MCV: 78.2 fL — ABNORMAL LOW (ref 80.0–100.0)
Platelets: 431 10*3/uL — ABNORMAL HIGH (ref 150–400)
RBC: 3.07 MIL/uL — ABNORMAL LOW (ref 4.22–5.81)
RDW: 19.5 % — ABNORMAL HIGH (ref 11.5–15.5)
WBC: 10.5 10*3/uL (ref 4.0–10.5)
nRBC: 0 % (ref 0.0–0.2)

## 2023-04-11 LAB — COMPREHENSIVE METABOLIC PANEL
ALT: 12 U/L (ref 0–44)
AST: 13 U/L — ABNORMAL LOW (ref 15–41)
Albumin: <1.5 g/dL — ABNORMAL LOW (ref 3.5–5.0)
Alkaline Phosphatase: 255 U/L — ABNORMAL HIGH (ref 38–126)
Anion gap: 6 (ref 5–15)
BUN: 11 mg/dL (ref 6–20)
CO2: 21 mmol/L — ABNORMAL LOW (ref 22–32)
Calcium: 6.5 mg/dL — ABNORMAL LOW (ref 8.9–10.3)
Chloride: 107 mmol/L (ref 98–111)
Creatinine, Ser: 1.12 mg/dL (ref 0.61–1.24)
GFR, Estimated: >60 mL/min (ref >60)
Glucose, Bld: 100 mg/dL — ABNORMAL HIGH (ref 70–99)
Potassium: 3.6 mmol/L (ref 3.5–5.1)
Sodium: 134 mmol/L — ABNORMAL LOW (ref 135–145)
Total Bilirubin: 0.4 mg/dL (ref <1.2)
Total Protein: 5.4 g/dL — ABNORMAL LOW (ref 6.5–8.1)

## 2023-04-11 LAB — GLUCOSE, CAPILLARY
Glucose-Capillary: 73 mg/dL (ref 70–99)
Glucose-Capillary: 71 mg/dL (ref 70–99)
Glucose-Capillary: 107 mg/dL — ABNORMAL HIGH (ref 70–99)
Glucose-Capillary: 87 mg/dL (ref 70–99)
Glucose-Capillary: 97 mg/dL (ref 70–99)
Glucose-Capillary: 107 mg/dL — ABNORMAL HIGH (ref 70–99)

## 2023-04-11 LAB — CULTURE, BLOOD (ROUTINE X 2)
Culture: NO GROWTH
Culture: NO GROWTH
Special Requests: ADEQUATE

## 2023-04-11 LAB — PREPARE RBC (CROSSMATCH)

## 2023-04-11 MED ORDER — PANTOPRAZOLE SODIUM 40 MG PO TBEC
40.0000 mg | DELAYED_RELEASE_TABLET | Freq: Two times a day (BID) | ORAL | Status: DC
  Administered 2023-04-11 – 2023-06-29 (×153): 40 mg via ORAL
  Filled 2023-04-11 (×158): qty 1

## 2023-04-11 MED ORDER — SODIUM CHLORIDE 0.9% IV SOLUTION: Freq: Once | INTRAVENOUS | Status: AC

## 2023-04-11 MED ORDER — OXYCODONE HCL 10 MG PO TABS: 5.0000 mg | ORAL_TABLET | Freq: Four times a day (QID) | ORAL | 0 refills | Status: DC | PRN

## 2023-04-11 MED ORDER — FOLIC ACID 1 MG PO TABS
1.0000 mg | ORAL_TABLET | Freq: Every day | ORAL | Status: DC
  Administered 2023-04-11 – 2023-05-14 (×22): 1 mg via ORAL
  Filled 2023-04-11 (×30): qty 1

## 2023-04-11 MED ORDER — ONDANSETRON HCL 4 MG PO TABS: 4.0000 mg | ORAL_TABLET | Freq: Three times a day (TID) | ORAL | 1 refills | Status: DC | PRN

## 2023-04-11 MED ORDER — DOCUSATE SODIUM 100 MG PO CAPS
100.0000 mg | ORAL_CAPSULE | Freq: Two times a day (BID) | ORAL | Status: DC
  Administered 2023-04-11 – 2023-06-11 (×79): 100 mg via ORAL
  Filled 2023-04-11 (×107): qty 1

## 2023-04-11 MED ORDER — LACTULOSE 10 GM/15ML PO SOLN
20.0000 g | Freq: Every day | ORAL | Status: DC
  Administered 2023-04-11 – 2023-04-21 (×6): 20 g via ORAL
  Filled 2023-04-11 (×9): qty 30

## 2023-04-11 NOTE — Progress Notes (Signed)
[facility] PROGRESS NOTE  [name] NUU:[id number] DOB: 1975-06-04 DOA: 04/06/2023 PCP: Patient, No Pcp Per  Status is: Inpatient Remains inpatient appropriate because: Necrotic.,  Surgery  Barriers to discharge: Social: Bilateral amputation Clinical:  Level of care: Progressive   Code Status: Full code Family Communication: Wife at bedside DVT prophylaxis:  COVID vaccination status:   Subjective: Complaining of nausea.  Mild abdominal pain in the epigastric area.  He did not have any bowel movements for the last 3 days.  Had a small bowel movement 2 days back. Objective: Vitals:   04/11/23 0403 04/11/23 0741  BP: 125/66 (!) 148/82  Pulse: 82 89  Resp: 14 19  Temp: 98.4 F (36.9 C) (!) 97.5 F (36.4 C)  SpO2: 99% 99%    Intake/Output Summary (Last 24 hours) at 04/11/2023 0952 Last data filed at 04/10/2023 2351 Gross per 24 hour  Intake --  Output 600 ml  Net -600 ml   Filed Weights   04/08/23 1244 04/09/23 0403 04/10/23 0503  Weight: (!) 142.9 kg (!) 142.8 kg (!) 140.1 kg    Exam:  Constitutional: NAD, calm, comfortable Eyes: PERRL, lids and conjunctivae normal ENMT: Mucous membranes are moist. Posterior pharynx clear of any exudate or lesions.Normal dentition.  Neck: normal, supple, no masses, no thyromegaly Respiratory: clear to auscultation bilaterally, no wheezing, no crackles. Normal respiratory effort. No accessory muscle use.  Cardiovascular: Regular rate and rhythm, no murmurs / rubs / gallops. No extremity edema. 2+ pedal pulses. No carotid bruits.  Abdomen: no tenderness, no masses palpated. No hepatosplenomegaly. Bowel sounds positive.  Musculoskeletal: no clubbing / cyanosis. No joint deformity upper and lower extremities. Good ROM, no contractures. Normal muscle tone.  Bilateral BKA.  Right BKA and the dressing Skin: no rashes, lesions, ulcers. No induration Neurologic: CN 2-12 grossly intact. Sensation intact, DTR normal.  Strength 5/5 x all 4 extremities.  Psychiatric: Normal judgment and insight. Alert and oriented x 3. Normal mood.    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  Patient was admitted to [facility] in August 2020 for when had left-sided foot infection since then lost follow-up with the primary care physician, last follow-up with wound care clinic.  Also has not been receiving his blood pressure medications or insulin for the last 4 months.  Patient comes to the emergency department with complaints of nausea, vomiting, diarrhea for the last 1 month.  Orthopedic surgery is consulted.  Going for amputation of the right leg BKA.  Patient is started on broad-spectrum antibiotics with Zosyn, vancomycin.  Patient was found to have low hemoglobin of 5.9, transfused 4 units of packed RBC with improvement of the hemoglobin to 8.8.  Nonhealing right foot ulcer with necrotic -S/p right BKA 11/27 -Continue with wound VAC -Patient will need to follow-up as an outpatient  Sepsis secondary to necrotic right foot ulcers, present on admission -Continue rectal vancomycin, cefepime -Admitted with a WBC count of 23,000, tachycardia, elevated lactic acid of 2.8 -WBC count trending down to 13.1-9.9  Nausea, vomiting -Discontinue IV narcotics -Continue with antinausea medications -Get KUB-normal bowel gas pattern -Most likely from constipation, mild gastritis.  Anemia multifactorial -No anemia workup on admission -Will get the anemia  workup in 1 week as patient received 4 units of PRBC -Get stool occult -Closely  follow-up with CBC. -Hemoglobin trended down to 7.5-6.3-5.9-8.5-7.5-7.2 -Give 1 unit of packed RBC 11/30 -Keep the patient on IV iron  Diabetes mellitus poorly controlled with hyperglycemia -Get hemoglobin A1c-6.8 -Continue with Lantus 10 units daily -Follow-up on sliding scale insulin.  Severe protein calorie malnutrition -Patient has albumin of less than 1.5 -Prealbumin level less than 5 at admission-> 8 -Getting nutritionist consult.  Acute on chronic kidney failure stage IIIa -Secondary to poorly controlled diabetes mellitus, noncompliance with medications for hypertension. -Normalized.  Hypocalcemia -Correcting to the hypoalbuminemia, normal calcium level  Peripheral vascular disease -S/p left BKA, s/p BKA  Hypertension accelerated -Keep her on Coreg 25 mg twice daily Data Reviewed: Basic Metabolic Panel: Recent Labs  Lab 04/07/23 0540 04/08/23 0707 04/09/23 0619 04/10/23 0337 04/11/23 0250  NA 135 136 133* 135 134*  K 3.5 3.6 4.0 3.5 3.6  CL 107 107 105 106 107  CO2 21* 21* 20* 24 21*  GLUCOSE 106* 103* 84 108* 100*  BUN 13 12 11 12 11   CREATININE 1.38* 1.52* 1.51* 1.38* 1.12  CALCIUM 6.9* 7.2* 6.9* 6.7* 6.5*   Liver Function Tests: Recent Labs  Lab 04/07/23 0540 04/08/23 0707 04/09/23 0619 04/10/23 0337 04/11/23 0250  AST 12* 9* 15 12* 13*  ALT 10 12 12 12 12   ALKPHOS 261* 215* 203* 244* 255*  BILITOT 0.7 0.8 0.8 0.6 0.4  PROT 5.9* 5.8* 5.7* 5.4* 5.4*  ALBUMIN <1.5* <1.5* <1.5* <1.5* <1.5*   No results for input(s): "LIPASE", "AMYLASE" in the last 168 hours. No results for input(s): "AMMONIA" in the last 168 hours. CBC: Recent Labs  Lab 04/06/23 2220 04/07/23 0540 04/07/23 0842 04/08/23 0707 04/09/23 0619 04/09/23 1139 04/09/23 2108 04/10/23 0337 04/10/23 0338 04/10/23 1338 04/11/23 0250  WBC 23.3* 18.2*  --  13.9* 13.1*  --   --  9.9  --   --  10.5  NEUTROABS 19.8*  --   --   --   --   --   --   --   --   --   --   HGB 7.5* 6.3*    < > 8.8* 8.0*   < > 7.9* 7.5* 7.5* 7.6* 7.2*  HCT 25.4* 22.5*   < > 28.0* 25.2*   < > 25.5* 24.5* 24.1* 24.8* 24.0*  MCV 74.3* 76.5*  --  76.3* 75.9*  --   --  77.5*  --   --  78.2*  PLT 516* 461*  --  413* 416*  --   --  417*  --   --  431*   < > = values in this interval not displayed.   Cardiac Enzymes: No results for input(s): "CKTOTAL", "CKMB", "CKMBINDEX", "TROPONINI" in the last 168 hours. BNP (last 3 results) Recent Labs    12/14/22 0839  BNP 197.3*    ProBNP (last 3 results) No results for input(s): "PROBNP" in the last 8760 hours.  CBG: Recent Labs  Lab 04/10/23 1618 04/10/23 2016 04/10/23 2353 04/11/23 0400 04/11/23 0744  GLUCAP 101* 128* 101* 73 71    Recent Results (from the past 240 hour(s))  Culture, blood (Routine x 2)     Status: None   Collection Time: 04/06/23 10:20 PM   Specimen: BLOOD  Result Value Ref Range Status   Specimen Description   Final    BLOOD LEFT ANTECUBITAL Performed at [facility], [address] [place], [name] [address]    Special Requests   Final    BOTTLES DRAWN AEROBIC AND ANAEROBIC Blood Culture adequate volume Performed at [facility], [address] [place], [name] [address]    Culture   Final    NO GROWTH 5 DAYS Performed at [facility], [address] [place], [name] [address]    Report Status 04/11/2023 FINAL  Final  Culture, blood (Routine x 2)     Status: None   Collection Time: 04/06/23 10:32 PM   Specimen: BLOOD  Result Value Ref Range Status   Specimen Description   Final    BLOOD BLOOD LEFT FOREARM Performed at [facility], [address] [place], [name] [address]    Special Requests   Final    BOTTLES DRAWN AEROBIC AND ANAEROBIC Blood Culture results may not be optimal due to an inadequate volume of blood received in culture bottles Performed at [facility], [address] [place], [name] [address]    Culture    Final    NO GROWTH 5 DAYS Performed at [facility], [address], [place], [name] [address]    Report Status 04/11/2023 FINAL  Final  Respiratory (~20 pathogens) panel by PCR     Status: None   Collection Time: 04/07/23  1:04 AM   Specimen: Nasopharyngeal Swab; Respiratory  Result Value Ref Range Status   Adenovirus NOT DETECTED NOT DETECTED Final   Coronavirus 229E NOT DETECTED NOT DETECTED Final    Comment: (NOTE) The Coronavirus on the Respiratory Panel, DOES NOT test for the novel  Coronavirus (2019 nCoV)    Coronavirus HKU1 NOT DETECTED NOT DETECTED Final   Coronavirus NL63 NOT DETECTED NOT DETECTED Final   Coronavirus OC43 NOT DETECTED NOT DETECTED Final   Metapneumovirus NOT DETECTED NOT DETECTED Final   Rhinovirus / Enterovirus NOT DETECTED NOT DETECTED Final   Influenza A NOT DETECTED NOT DETECTED Final   Influenza B NOT DETECTED NOT DETECTED Final   Parainfluenza Virus 1 NOT DETECTED NOT DETECTED Final   Parainfluenza Virus 2 NOT DETECTED NOT DETECTED Final   Parainfluenza Virus 3 NOT DETECTED NOT DETECTED Final   Parainfluenza Virus 4 NOT DETECTED NOT DETECTED Final   Respiratory Syncytial Virus NOT DETECTED NOT DETECTED Final   Bordetella pertussis NOT DETECTED NOT DETECTED Final   Bordetella Parapertussis NOT DETECTED NOT DETECTED Final   Chlamydophila pneumoniae NOT DETECTED NOT DETECTED Final   Mycoplasma pneumoniae NOT DETECTED NOT DETECTED Final    Comment: Performed at [facility], [address], [place], [name] [address]  Culture, blood (routine x 2) Call MD if unable to obtain prior to antibiotics being given     Status: None (Preliminary result)   Collection Time: 04/07/23  5:40 AM   Specimen: BLOOD RIGHT ARM  Result Value Ref Range Status   Specimen Description   Final    BLOOD RIGHT ARM Performed at [facility], [address] [place], [name] [address]    Special Requests   Final    BOTTLES DRAWN AEROBIC ONLY Blood Culture  adequate volume Performed at [facility], [address], [place], [name] [address]    Culture   Final    NO GROWTH 4 DAYS Performed at [facility], [address], [place], [name] [address]    Report Status PENDING  Incomplete  Culture, blood (routine x 2) Call MD if unable  to obtain prior to antibiotics being given     Status: None (Preliminary result)   Collection Time: 04/07/23  5:40 AM   Specimen: BLOOD RIGHT HAND  Result Value Ref Range Status   Specimen Description   Final    BLOOD RIGHT HAND Performed at [facility], [address], [place], [name] [address]    Special Requests   Final    BOTTLES DRAWN AEROBIC ONLY Blood Culture adequate volume Performed at [facility], [address], [place], [name] [address]    Culture   Final    NO GROWTH 4 DAYS Performed at [facility], [address], [place], [name] [address]    Report Status PENDING  Incomplete  Surgical pcr screen     Status: None   Collection Time: 04/08/23  2:10 AM   Specimen: Nasal Mucosa; Nasal Swab  Result Value Ref Range Status   MRSA, PCR NEGATIVE NEGATIVE Final   Staphylococcus aureus NEGATIVE NEGATIVE Final    Comment: (NOTE) The Xpert SA Assay (FDA approved for NASAL specimens in patients 76 years of age and older), is one component of a comprehensive surveillance program. It is not intended to diagnose infection nor to guide or monitor treatment. Performed at [facility], [address], [place], [name] [address]      Studies: DG Abd 1 View  Result Date: 04/10/2023 CLINICAL DATA:  Nausea vomiting. EXAM: ABDOMEN - 1 VIEW COMPARISON:  None Available. FINDINGS: Normal bowel gas pattern. Soft tissues are unremarkable. No acute skeletal abnormality. IMPRESSION: Negative. Electronically Signed   By: Amie [place] M.D.   On: 04/10/2023 14:31    Scheduled Meds:  sodium chloride   Intravenous Once   sodium chloride    Intravenous Once   sodium chloride   Intravenous Once   sodium chloride   Intravenous Once   vitamin C  1,000 mg Oral Daily   feeding supplement  237 mL Oral BID BM   nutrition supplement (JUVEN)  1 packet Oral BID BM   sodium chloride flush  3 mL Intravenous Q12H   zinc sulfate (50mg  elemental zinc)  220 mg Oral Daily   Continuous Infusions:  sodium chloride 10 mL/hr at 04/10/23 0500   magnesium sulfate bolus IVPB      Principal Problem:   Sepsis (HCC) Active Problems:   Acute osteomyelitis of right foot (HCC)   Acute on chronic anemia   Metabolic acidosis   CAP (community acquired pneumonia)   Diarrhea   Essential hypertension   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)   Insulin dependent type 2 diabetes mellitus (HCC)   CKD (chronic kidney disease), stage II   Hx of left BKA (HCC)   PVD (peripheral vascular disease) (HCC)   Subacute osteomyelitis of right foot (HCC)   Cutaneous abscess of right foot   Subacute osteomyelitis, right ankle and foot (HCC)  Consultants: Orthopedic surgery  Procedures: Right BKA  Antibiotics: Vancomycin, levofloxacin 11/25  Time spent: 40 minutes    Juri Dinning MD  [facility] 7 am - 330 pm/M-F for direct patient care and secure chat Please refer to Amion for contact info 4  days

## 2023-04-12 DIAGNOSIS — R6520 Severe sepsis without septic shock: Secondary | ICD-10-CM | POA: Diagnosis not present

## 2023-04-12 DIAGNOSIS — A419 Sepsis, unspecified organism: Secondary | ICD-10-CM | POA: Diagnosis not present

## 2023-04-12 DIAGNOSIS — N179 Acute kidney failure, unspecified: Secondary | ICD-10-CM | POA: Diagnosis not present

## 2023-04-12 LAB — CULTURE, BLOOD (ROUTINE X 2)
Culture: NO GROWTH
Culture: NO GROWTH
Special Requests: ADEQUATE
Special Requests: ADEQUATE

## 2023-04-12 LAB — TYPE AND SCREEN
ABO/RH(D): O POS
Antibody Screen: NEGATIVE
Unit division: 0

## 2023-04-12 LAB — CBC
HCT: 27.6 % — ABNORMAL LOW (ref 39.0–52.0)
Hemoglobin: 8.6 g/dL — ABNORMAL LOW (ref 13.0–17.0)
MCH: 24.8 pg — ABNORMAL LOW (ref 26.0–34.0)
MCHC: 31.2 g/dL (ref 30.0–36.0)
MCV: 79.5 fL — ABNORMAL LOW (ref 80.0–100.0)
Platelets: 466 10*3/uL — ABNORMAL HIGH (ref 150–400)
RBC: 3.47 MIL/uL — ABNORMAL LOW (ref 4.22–5.81)
RDW: 20 % — ABNORMAL HIGH (ref 11.5–15.5)
WBC: 11.1 10*3/uL — ABNORMAL HIGH (ref 4.0–10.5)
nRBC: 0 % (ref 0.0–0.2)

## 2023-04-12 LAB — BPAM RBC
Blood Product Expiration Date: 202412252359
ISSUE DATE / TIME: 202411301209
Unit Type and Rh: 5100

## 2023-04-12 LAB — GLUCOSE, CAPILLARY
Glucose-Capillary: 84 mg/dL (ref 70–99)
Glucose-Capillary: 79 mg/dL (ref 70–99)

## 2023-04-12 MED ORDER — ACETAMINOPHEN 500 MG PO TABS
1000.0000 mg | ORAL_TABLET | Freq: Three times a day (TID) | ORAL | Status: DC | PRN
  Administered 2023-04-12 – 2023-06-28 (×43): 1000 mg via ORAL
  Filled 2023-04-12 (×47): qty 2

## 2023-04-12 MED ORDER — ADULT MULTIVITAMIN W/MINERALS CH
1.0000 | ORAL_TABLET | Freq: Every day | ORAL | Status: DC
  Administered 2023-04-15 – 2023-05-12 (×20): 1 via ORAL
  Filled 2023-04-12 (×29): qty 1

## 2023-04-12 MED ORDER — SODIUM CHLORIDE 0.9% FLUSH
10.0000 mL | Freq: Two times a day (BID) | INTRAVENOUS | Status: DC
  Administered 2023-04-12 – 2023-05-09 (×40): 10 mL via INTRAVENOUS

## 2023-04-12 NOTE — Progress Notes (Signed)
Initial Nutrition Assessment  DOCUMENTATION CODES:   Obesity unspecified  INTERVENTION:  Liberalize diet Continue Ensure Plus High Protein po BID, each supplement provides 350 kcal and 20 grams of protein.  Continue -1 packet Juven BID, each packet provides 95 calories, 2.5 grams of protein (collagen), and 9.8 grams of carbohydrate (3 grams sugar); also contains 7 grams of L-arginine and L-glutamine, 300 mg vitamin C, 15 mg vitamin E, 1.2 mcg vitamin B-12, 9.5 mg zinc, 200 mg calcium, and 1.5 g  Calcium Beta-hydroxy-Beta-methylbutyrate to support wound healing Start calorie count Multivitamin with minerals.  NUTRITION DIAGNOSIS:   Increased nutrient needs related to chronic illness as evidenced by estimated needs.    GOAL:   Patient will meet greater than or equal to 90% of their needs    MONITOR:   PO intake, Supplement acceptance  REASON FOR ASSESSMENT:   Consult Assessment of nutrition requirement/status, Calorie Count  ASSESSMENT:   47 y.o. M presented  from home with N/V/D x 2 weeks and right foot wound, admitted with sepsis. PMH; type 2 diabetes, CHF, BKA of left lower extremity and ongoing diabetic foot infection. Reached out via phone with no answer. All information obtained through EMR and team.  Noted to have 19% weight decline over the last year. Edema noted in the BLE. 5% weight change since admitting. This could possible be related to fluid shifts. RN reports fair appetite.  Also stated that she thinks his wife is bringing in food that he will partially eat. He is independent feeding ability. No chewing or swallowing concerns noted. Appears to have had appetite decline. Weight loss unable to verify if this was desired or unintentional. RN today reported pt not drinking ensure or juven. Flow chart shows that being consumed. Will follow up with .   Will liberalize diet duet to suspected meal intake decline and foods coming in from out of facility.  08/15/22  LBKA 04/08/23 RBKA  Admit weight: 145.2 kg Current weight: 137.8 kg  Weight history:  04/12/23 (!) 137.8 kg  12/14/22 (!) 144.2 kg  09/10/22 (!) 156.7 kg  08/15/22 (!) 169.6 kg  06/06/22 (!) 170.1 kg   Last Weight  Most recent update: 04/12/2023  3:29 AM    Weight  137.8 kg (303 lb 12.7 oz)               Average Meal Intake: No current documentation.  Nutritionally Relevant Medications: Scheduled Meds:  vitamin C  1,000 mg Oral Daily   docusate sodium  100 mg Oral BID   feeding supplement  237 mL Oral BID BM   folic acid  1 mg Oral Daily   lactulose  20 g Oral Daily   nutrition supplement (JUVEN)  1 packet Oral BID BM   zinc sulfate (50mg  elemental zinc)  220 mg Oral Daily      Labs Reviewed    NUTRITION - FOCUSED PHYSICAL EXAM:  Deferred   Diet Order:   Diet Order             Diet - low sodium heart healthy           Diet Carb Modified Fluid consistency: Thin; Room service appropriate? Yes with Assist  Diet effective now                   EDUCATION NEEDS:   Not appropriate for education at this time  Skin:  Skin Assessment: Skin Integrity Issues: Skin Integrity Issues:: Diabetic Ulcer Diabetic Ulcer: R heel  Last BM:  12/1  Height:   Ht Readings from Last 1 Encounters:  04/08/23 6\' 4"  (1.93 m)    Weight:   Wt Readings from Last 1 Encounters:  04/12/23 (!) 137.8 kg    Ideal Body Weight:     BMI:  Body mass index is 36.98 kg/m.  Estimated Nutritional Needs:   Kcal:  2450- 2800 kcal/d  Protein:  105-120 g/d  Fluid:  27ml/kcal    Jamelle Haring RDN, LDN Clinical Dietitian  RDN pager # available on Amion

## 2023-04-12 NOTE — TOC Progression Note (Signed)
Transition of Care (TOC) - Progression Note    Patient Details  Name: Alan Tapia. MRN: [id number] Date of Birth: [birth year]  Transition of Care [facility]) CM/SW Contact  Patrice Paradise, LCSW Phone Number: 04/12/2023, 8:21 AM  Clinical Narrative:    CSW was asked about pt's bed offers by MD and pt does not have any bed offers.  TOC team will continue to assist with discharge planning needs.    Expected Discharge Plan: Skilled Nursing Facility Barriers to Discharge: Insurance Authorization, Continued Medical Work up, SNF Pending bed offer  Expected Discharge Plan and Services In-house Referral: Clinical Social Work     Living arrangements for the past 2 months: Single Family Home Expected Discharge Date: 04/11/23                                     Social Determinants of Health (SDOH) Interventions SDOH Screenings   Food Insecurity: No Food Insecurity (04/07/2023)  Housing: Medium Risk (04/07/2023)  Transportation Needs: No Transportation Needs (04/07/2023)  Recent Concern: Transportation Needs - Unmet Transportation Needs (02/17/2023)   Received from [facility]  Utilities: At Risk (04/07/2023)  Alcohol Screen: Low Risk  (08/20/2022)  Financial Resource Strain: Patient Unable To Answer (08/20/2022)  Tobacco Use: Low Risk  (04/08/2023)    Readmission Risk Interventions    04/07/2023    2:34 PM  Readmission Risk Prevention Plan  Transportation Screening Complete  PCP or Specialist Appt within 3-5 Days Complete  HRI or Home Care Consult Complete  Social Work Consult for Recovery Care Planning/Counseling Complete  Palliative Care Screening Not Applicable  Medication Review Oceanographer) Complete

## 2023-04-12 NOTE — Progress Notes (Signed)
PROGRESS NOTE    Alan Tapia.  ZOX:[id number] DOB: Apr 15, 1976 DOA: 04/06/2023 PCP: Patient, No Pcp Per     Brief Narrative:  Alan Tapia. 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  He was also having nausea, vomiting and diarrhea.  Orthopedic surgery was consulted and patient underwent right BKA 11/27 by Dr. Lajoyce Corners.  New events last 24 hours / Subjective: Patient without any new complaints.  Spouse is at bedside.  Discussed with them that he has no bed offers at skilled nursing facility.  Spouse will continue to work with PT on how to get patient home.  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 Lajoyce Corners outpatient  Microcytic anemia -Combination of iron deficiency anemia and anemia of chronic disease -FOBT negative -Received total 4 unit packed red blood cells transfusions this hospitalization -Hemoglobin stable  Diabetes  mellitus -A1c 6.8 -Blood sugar well-controlled  AKI  -Resolved, baseline creatinine 1.1.  No CKD    DVT prophylaxis:  SCD's Start: 04/08/23 1658 SCDs Start: 04/07/23 0104  Code Status: Full code Family Communication: At bedside Disposition Plan: Difficult to place Status is: Inpatient Remains inpatient appropriate because: SNF placement recommended but no bed offers    Antimicrobials:  Anti-infectives (From admission, onward)    Start     Dose/Rate Route Frequency Ordered Stop   04/09/23 1345  doxycycline (VIBRAMYCIN) 100 mg in dextrose 5 % 250 mL IVPB        100 mg 125 mL/hr over 120 Minutes Intravenous Every 12 hours 04/09/23 1252 04/09/23 1746   04/08/23 1300  levofloxacin (LEVAQUIN) IVPB 500 mg  Status:  Discontinued        500 mg 100 mL/hr over 60 Minutes Intravenous On call to O.R. 04/08/23 1229 04/08/23 1648   04/08/23 1300  vancomycin (VANCOREADY) IVPB 1500 mg/300 mL        1,500 mg 100 mL/hr over 180 Minutes Intravenous On call to O.R. 04/08/23 1229 04/08/23 1433   04/08/23 1237  vancomycin HCl (VANCOREADY) 1500 MG/300ML IVPB       Note to Pharmacy: [name] D: cabinet override      04/08/23 1237 04/08/23 1439   04/07/23 0800  piperacillin-tazobactam (ZOSYN) IVPB 3.375 g        3.375 g 12.5 mL/hr over 240 Minutes Intravenous Every 8 hours 04/07/23 0108 04/09/23 2108   04/07/23 0100  doxycycline (VIBRAMYCIN) 100 mg in dextrose 5 % 250 mL IVPB  Status:  Discontinued  100 mg 125 mL/hr over 120 Minutes Intravenous Every 12 hours 04/07/23 0057 04/09/23 1252   04/07/23 0000  ceFEPIme (MAXIPIME) 2 g in sodium chloride 0.9 % 100 mL IVPB  Status:  Discontinued        2 g 200 mL/hr over 30 Minutes Intravenous Every 8 hours 04/06/23 2349 04/07/23 0108   04/06/23 2330  metroNIDAZOLE (FLAGYL) IVPB 500 mg        500 mg 100 mL/hr over 60 Minutes Intravenous  Once 04/06/23 2321 04/07/23 0032   04/06/23 2300  piperacillin-tazobactam (ZOSYN) IVPB 3.375 g  Status:   Discontinued        3.375 g 100 mL/hr over 30 Minutes Intravenous  Once 04/06/23 2257 04/06/23 2320   04/06/23 2300  vancomycin (VANCOREADY) IVPB 2000 mg/400 mL  Status:  Discontinued        2,000 mg 200 mL/hr over 120 Minutes Intravenous  Once 04/06/23 2257 04/06/23 2312        Objective: Vitals:   04/11/23 2321 04/12/23 0321 04/12/23 0810 04/12/23 1158  BP: (!) 144/79 137/76 (!) 142/74 138/68  Pulse: 94 92 78 87  Resp: 18 (!) 24 10 15   Temp: 98.1 F (36.7 C) 97.9 F (36.6 C) 97.8 F (36.6 C) 98.1 F (36.7 C)  TempSrc: Oral Oral Oral Oral  SpO2: 100% 100% 99% 99%  Weight:  (!) 137.8 kg    Height:        Intake/Output Summary (Last 24 hours) at 04/12/2023 1320 Last data filed at 04/12/2023 0500 Gross per 24 hour  Intake 1436.67 ml  Output 900 ml  Net 536.67 ml   Filed Weights   04/09/23 0403 04/10/23 0503 04/12/23 0321  Weight: (!) 142.8 kg (!) 140.1 kg (!) 137.8 kg    Examination:  General exam: Appears calm and comfortable  Respiratory system: Clear to auscultation. Respiratory effort normal. No respiratory distress. No conversational dyspnea.  Cardiovascular system: S1 & S2 heard, RRR. No murmurs. No pedal edema. Gastrointestinal system: Abdomen is nondistended, soft and nontender. Normal bowel sounds heard. Central nervous system: Alert and oriented. No focal neurological deficits. Speech clear.  Extremities: Left BKA, right BKA Psychiatry: Judgement and insight appear normal. Mood & affect appropriate.   Data Reviewed: I have personally reviewed following labs and imaging studies  CBC: Recent Labs  Lab 04/06/23 2220 04/07/23 0540 04/08/23 0707 04/09/23 0619 04/09/23 1139 04/10/23 0337 04/10/23 0338 04/10/23 1338 04/11/23 0250 04/12/23 0903  WBC 23.3*   < > 13.9* 13.1*  --  9.9  --   --  10.5 11.1*  NEUTROABS 19.8*  --   --   --   --   --   --   --   --   --   HGB 7.5*   < > 8.8* 8.0*   < > 7.5* 7.5* 7.6* 7.2* 8.6*  HCT 25.4*   < > 28.0* 25.2*   <  > 24.5* 24.1* 24.8* 24.0* 27.6*  MCV 74.3*   < > 76.3* 75.9*  --  77.5*  --   --  78.2* 79.5*  PLT 516*   < > 413* 416*  --  417*  --   --  431* 466*   < > = values in this interval not displayed.   Basic Metabolic Panel: Recent Labs  Lab 04/07/23 0540 04/08/23 0707 04/09/23 0619 04/10/23 0337 04/11/23 0250  NA 135 136 133* 135 134*  K 3.5 3.6 4.0 3.5 3.6  CL 107 107 105 106 107  CO2 21* 21* 20* 24 21*  GLUCOSE 106* 103* 84 108* 100*  BUN 13 12 11 12 11   CREATININE 1.38* 1.52* 1.51* 1.38* 1.12  CALCIUM 6.9* 7.2* 6.9* 6.7* 6.5*   GFR: Estimated Creatinine Clearance: 123.6 mL/min (by C-G formula based on SCr of 1.12 mg/dL). Liver Function Tests: Recent Labs  Lab 04/07/23 0540 04/08/23 0707 04/09/23 0619 04/10/23 0337 04/11/23 0250  AST 12* 9* 15 12* 13*  ALT 10 12 12 12 12   ALKPHOS 261* 215* 203* 244* 255*  BILITOT 0.7 0.8 0.8 0.6 0.4  PROT 5.9* 5.8* 5.7* 5.4* 5.4*  ALBUMIN <1.5* <1.5* <1.5* <1.5* <1.5*   No results for input(s): "LIPASE", "AMYLASE" in the last 168 hours. No results for input(s): "AMMONIA" in the last 168 hours. Coagulation Profile: Recent Labs  Lab 04/06/23 2220 04/07/23 0540 04/08/23 0707  INR 1.8* 1.8* 1.6*   Cardiac Enzymes: No results for input(s): "CKTOTAL", "CKMB", "CKMBINDEX", "TROPONINI" in the last 168 hours. BNP (last 3 results) No results for input(s): "PROBNP" in the last 8760 hours. HbA1C: No results for input(s): "HGBA1C" in the last 72 hours. CBG: Recent Labs  Lab 04/11/23 1601 04/11/23 2024 04/11/23 2319 04/12/23 0320 04/12/23 0808  GLUCAP 87 97 107* 84 79   Lipid Profile: No results for input(s): "CHOL", "HDL", "LDLCALC", "TRIG", "CHOLHDL", "LDLDIRECT" in the last 72 hours. Thyroid Function Tests: No results for input(s): "TSH", "T4TOTAL", "FREET4", "T3FREE", "THYROIDAB" in the last 72 hours. Anemia Panel: Recent Labs    04/10/23 1114 04/10/23 1338  VITAMINB12 3,052*  --   FOLATE 5.8*  --   TIBC  --  NOT  CALCULATED  IRON  --  34*  RETICCTPCT 2.3  --    Sepsis Labs: Recent Labs  Lab 04/06/23 2242 04/07/23 0540 04/07/23 0842  PROCALCITON  --  >150.00  --   LATICACIDVEN 2.8* 1.4 1.0    Recent Results (from the past 240 hour(s))  Culture, blood (Routine x 2)     Status: None   Collection Time: 04/06/23 10:20 PM   Specimen: BLOOD  Result Value Ref Range Status   Specimen Description   Final    BLOOD LEFT ANTECUBITAL Performed at [facility], [address] [place], [name] [address]    Special Requests   Final    BOTTLES DRAWN AEROBIC AND ANAEROBIC Blood Culture adequate volume Performed at [facility], [address] [place], [name] [address]    Culture   Final    NO GROWTH 5 DAYS Performed at [facility], [address] [place], [name] [address]    Report Status 04/11/2023 FINAL  Final  Culture, blood (Routine x 2)     Status: None   Collection Time: 04/06/23 10:32 PM   Specimen: BLOOD  Result Value Ref Range Status   Specimen Description   Final    BLOOD BLOOD LEFT FOREARM Performed at [facility], [address] [place], [name] [address]    Special Requests   Final    BOTTLES DRAWN AEROBIC AND ANAEROBIC Blood Culture results may not be optimal due to an inadequate volume of blood received in culture bottles Performed at [facility], [address] [place], [name] [address]    Culture   Final    NO GROWTH 5 DAYS Performed at [facility], [address] [place], [name] [address]    Report Status 04/11/2023 FINAL  Final  Respiratory (~20 pathogens) panel by  PCR     Status: None   Collection Time: 04/07/23  1:04 AM   Specimen: Nasopharyngeal Swab; Respiratory  Result Value Ref Range Status   Adenovirus NOT DETECTED NOT DETECTED Final   Coronavirus 229E NOT DETECTED NOT DETECTED Final    Comment: (NOTE) The Coronavirus on the Respiratory Panel, DOES NOT test for  the novel  Coronavirus (2019 nCoV)    Coronavirus HKU1 NOT DETECTED NOT DETECTED Final   Coronavirus NL63 NOT DETECTED NOT DETECTED Final   Coronavirus OC43 NOT DETECTED NOT DETECTED Final   Metapneumovirus NOT DETECTED NOT DETECTED Final   Rhinovirus / Enterovirus NOT DETECTED NOT DETECTED Final   Influenza A NOT DETECTED NOT DETECTED Final   Influenza B NOT DETECTED NOT DETECTED Final   Parainfluenza Virus 1 NOT DETECTED NOT DETECTED Final   Parainfluenza Virus 2 NOT DETECTED NOT DETECTED Final   Parainfluenza Virus 3 NOT DETECTED NOT DETECTED Final   Parainfluenza Virus 4 NOT DETECTED NOT DETECTED Final   Respiratory Syncytial Virus NOT DETECTED NOT DETECTED Final   Bordetella pertussis NOT DETECTED NOT DETECTED Final   Bordetella Parapertussis NOT DETECTED NOT DETECTED Final   Chlamydophila pneumoniae NOT DETECTED NOT DETECTED Final   Mycoplasma pneumoniae NOT DETECTED NOT DETECTED Final    Comment: Performed at [facility], [address] [place], [name] [address]  Culture, blood (routine x 2) Call MD if unable to obtain prior to antibiotics being given     Status: None   Collection Time: 04/07/23  5:40 AM   Specimen: BLOOD RIGHT ARM  Result Value Ref Range Status   Specimen Description   Final    BLOOD RIGHT ARM Performed at [facility], [address] [place], [name] [address]    Special Requests   Final    BOTTLES DRAWN AEROBIC ONLY Blood Culture adequate volume Performed at [facility], [address], [place], [name] [address]    Culture   Final    NO GROWTH 5 DAYS Performed at [facility], [address], [place], [name] [address]    Report Status 04/12/2023 FINAL  Final  Culture, blood (routine x 2) Call MD if unable to obtain prior to antibiotics being given     Status: None   Collection Time: 04/07/23  5:40 AM   Specimen: BLOOD RIGHT HAND  Result Value Ref Range Status   Specimen Description   Final    BLOOD RIGHT  HAND Performed at [facility], [address] [place], [name] [address]    Special Requests   Final    BOTTLES DRAWN AEROBIC ONLY Blood Culture adequate volume Performed at [place], [address] [place], [name] [address]    Culture   Final    NO GROWTH 5 DAYS Performed at [facility], [address], [place], [name] [address]    Report Status 04/12/2023 FINAL  Final  Surgical pcr screen     Status: None   Collection Time: 04/08/23  2:10 AM   Specimen: Nasal Mucosa; Nasal Swab  Result Value Ref Range Status   MRSA, PCR NEGATIVE NEGATIVE Final   Staphylococcus aureus NEGATIVE NEGATIVE Final    Comment: (NOTE) The Xpert SA Assay (FDA approved for NASAL specimens in patients 46 years of age and older), is one component of a comprehensive surveillance program. It is not intended to diagnose infection nor to guide or monitor treatment. Performed at [facility], [address], [place], [name] [address]       Radiology Studies: No results found.    Scheduled Meds:  vitamin C  1,000 mg Oral Daily   docusate sodium  100 mg Oral BID   feeding supplement  237 mL Oral BID BM   folic acid  1 mg Oral Daily   lactulose  20 g Oral Daily   nutrition supplement (JUVEN)  1 packet Oral BID BM   pantoprazole  40 mg Oral BID   sodium chloride flush  10 mL Intravenous Q12H   sodium chloride flush  3 mL Intravenous Q12H   zinc sulfate (50mg  elemental zinc)  220 mg Oral Daily   Continuous Infusions:   LOS: 5 days   Time spent: 35 minutes   Noralee Stain, DO [facility] 04/12/2023, 1:20 PM   Available via Epic secure chat 7am-7pm After these hours, please refer to coverage provider listed on [email or web address]

## 2023-04-12 NOTE — Plan of Care (Signed)
  Problem: Pain Management: Goal: General experience of comfort will improve Outcome: Not Progressing   Problem: Skin Integrity: Goal: Risk for impaired skin integrity will decrease Outcome: Progressing   Problem: Clinical Measurements: Goal: Cardiovascular complication will be avoided Outcome: Progressing   Problem: Nutrition: Goal: Adequate nutrition will be maintained Outcome: Progressing

## 2023-04-13 DIAGNOSIS — N179 Acute kidney failure, unspecified: Secondary | ICD-10-CM | POA: Diagnosis not present

## 2023-04-13 DIAGNOSIS — R6520 Severe sepsis without septic shock: Secondary | ICD-10-CM | POA: Diagnosis not present

## 2023-04-13 DIAGNOSIS — A419 Sepsis, unspecified organism: Secondary | ICD-10-CM | POA: Diagnosis not present

## 2023-04-13 LAB — SURGICAL PATHOLOGY

## 2023-04-13 MED ORDER — PROSOURCE PLUS PO LIQD
30.0000 mL | Freq: Two times a day (BID) | ORAL | Status: DC
  Administered 2023-04-15 – 2023-05-11 (×26): 30 mL via ORAL
  Filled 2023-04-13 (×31): qty 30

## 2023-04-13 MED ORDER — GERHARDT'S BUTT CREAM
TOPICAL_CREAM | Freq: Two times a day (BID) | CUTANEOUS | Status: DC
  Administered 2023-04-27 – 2023-06-26 (×10): 1 via TOPICAL
  Filled 2023-04-13 (×13): qty 1

## 2023-04-13 NOTE — Progress Notes (Addendum)
Occupational Therapy Treatment Patient Details Name: Alan Tapia. MRN: [id number] DOB: [birth year] Today's Date: 04/13/2023   History of present illness The pt is a 47 yo male presenting 11/25 with nausea, vomiting, and fever as well as R foot wound. 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   OT comments  Pt with gradual progress towards goals. Focused session on bed mobility techniques to simulate home environment, sitting balance EOB and introduction to transfer options given B BKA. Pt able to manage most bed mobility with Min A (+2 for safety) though unable to progress simulated scooting along bedside due to nausea/dizziness (BP WFL) and reports of significant discomfort from skin breakdown around perineum. Pt's significant other present and engaged; both hopeful for postacute rehab though noted insurance barriers.      If plan is discharge home, recommend the following:  Two people to help with walking and/or transfers;Two people to help with bathing/dressing/bathroom;Assistance with cooking/housework;Assist for transportation;Help with stairs or ramp for entrance   Equipment Recommendations  Hoyer lift    Recommendations for Other Services      Precautions / Restrictions Precautions Precautions: Fall;Other (comment) Precaution Comments: RLE limb protector Restrictions Weight Bearing Restrictions: Yes Other Position/Activity Restrictions: bil BKA       Mobility Bed Mobility Overal bed mobility: Needs Assistance Bed Mobility: Supine to Sit, Rolling, Sit to Supine Rolling: Max assist   Supine to sit: Min assist, +2 for safety/equipment, +2 for physical assistance, HOB elevated, Used rails Sit to supine: Min assist, +2 for physical assistance, +2 for safety/equipment   General bed mobility comments: simulated  home environment w/ pt gradually bringing LE to EOB (assist for RLE fully EOB) and light assist to lift trunk with pt using bedrails. Pt able to lay trunk back down onto bed w/ some assist for LE mgmt supine. Max A x 2 to scoot up in bed. max A to roll for changing bed pad and assessing skin    Transfers                   General transfer comment: unable to attempt scooting along bedside due to pt's reported peri pain     Balance Overall balance assessment: Needs assistance Sitting-balance support: Bilateral upper extremity supported, Single extremity supported Sitting balance-Leahy Scale: Fair Sitting balance - Comments: fairly reliant on BUE support EOB but no external assist needed                                   ADL either performed or assessed with clinical judgement   ADL Overall ADL's : Needs assistance/impaired                     Lower Body Dressing: Total assistance;+2 for physical assistance;Bed level Lower Body Dressing Details (indicate cue type and reason): limb protector fixation x 2; does not appear to fit limb optimally               General ADL Comments: Focus on EOB sitting balance and initiation of scooting for transfers though limited by pt's reported raw perineal site    Extremity/Trunk Assessment Upper Extremity Assessment Upper Extremity Assessment: Overall WFL for tasks assessed;Right hand dominant   Lower Extremity Assessment Lower Extremity  Assessment: Defer to PT evaluation        Vision   Vision Assessment?: No apparent visual deficits   Perception     Praxis      Cognition Arousal: Alert Behavior During Therapy: WFL for tasks assessed/performed Overall Cognitive Status: Within Functional Limits for tasks assessed                                          Exercises      Shoulder Instructions       General Comments Significnat other at bedside and assisting. Pt reported dizziness  and nausea EOB but BP WFL and pt wished to proceed with session. Noted redness and skin breakdown around bottom and scrotum- RN notified    Pertinent Vitals/ Pain       Pain Assessment Pain Assessment: Faces Faces Pain Scale: Hurts little more Pain Location: perineal area Pain Descriptors / Indicators: Burning, Sharp, Sore Pain Intervention(s): Monitored during session, Limited activity within patient's tolerance, Other (comment) (notified RN)  Home Living                                          Prior Functioning/Environment              Frequency  Min 1X/week        Progress Toward Goals  OT Goals(current goals can now be found in the care plan section)  Progress towards OT goals: OT to reassess next treatment  Acute Rehab OT Goals Patient Stated Goal: peri region pain improvements, be able to transfer OT Goal Formulation: With patient Time For Goal Achievement: 04/24/23 Potential to Achieve Goals: Good ADL Goals Pt Will Perform Grooming: with supervision;sitting Pt Will Perform Upper Body Bathing: with contact guard assist;sitting Pt Will Perform Upper Body Dressing: with modified independence;sitting Pt Will Transfer to Toilet: with mod assist;with transfer board Pt Will Perform Toileting - Clothing Manipulation and hygiene: with min assist;sitting/lateral leans Pt/caregiver will Perform Home Exercise Program: Increased strength;Both right and left upper extremity;With theraband;With theraputty;Independently;With written HEP provided  Plan      Co-evaluation                 AM-PAC OT "6 Clicks" Daily Activity     Outcome Measure   Help from another person eating meals?: A Little Help from another person taking care of personal grooming?: A Little Help from another person toileting, which includes using toliet, bedpan, or urinal?: Total Help from another person bathing (including washing, rinsing, drying)?: A Lot Help from another  person to put on and taking off regular upper body clothing?: A Little Help from another person to put on and taking off regular lower body clothing?: Total 6 Click Score: 13    End of Session    OT Visit Diagnosis: Other abnormalities of gait and mobility (R26.89);Muscle weakness (generalized) (M62.81);Other (comment)   Activity Tolerance Patient limited by pain   Patient Left in bed;with call bell/phone within reach;with bed alarm set;with family/visitor present   Nurse Communication Mobility status;Other (comment) (skin integrity concerns)        Time: 1101-1135 OT Time Calculation (min): 34 min  Charges: OT General Charges $OT Visit: 1 Visit OT Treatments $Self Care/Home Management : 8-22 mins $Therapeutic Activity: 8-22 mins  Raynelle Fanning B, OTR/L  Acute Rehab Services Office: [phone]   Lorre Munroe 04/13/2023, 1:15 PM

## 2023-04-13 NOTE — Progress Notes (Signed)
Patient is refusing to get on the air mattress. Patient understands that his wounds can get worse. Informed patient that he needs to rotate himself in the current bed. Patient voiced understanding  Will continue to monitor

## 2023-04-13 NOTE — Progress Notes (Signed)
Air mattress ordered. Patient is refusing to be transferred over to the air bed. Talked with wife the importance of moving him over to help with the skin breakdown. Wife voiced understanding and will attempt to convince patient to be transferred over.  Will continue to monitor

## 2023-04-13 NOTE — Plan of Care (Signed)
  Problem: Health Behavior/Discharge Planning: Goal: Ability to manage health-related needs will improve Outcome: Not Progressing   Problem: Activity: Goal: Ability to perform//tolerate increased activity and mobilize with assistive devices will improve Outcome: Not Progressing   Problem: Self-Care: Goal: Ability to meet self-care needs will improve Outcome: Not Progressing   Problem: Health Behavior/Discharge Planning: Goal: Ability to manage health-related needs will improve Outcome: Not Progressing

## 2023-04-13 NOTE — Progress Notes (Addendum)
Nutrition Follow-up  DOCUMENTATION CODES:   Severe malnutrition in context of chronic illness  INTERVENTION:   -D/C calorie count  -D/C Juven and Ensure Enlive - Add Magic cup TID with meals, each supplement provides 290 kcal and 9 grams of protein - Add Prosource Plus BID - Continue MVI with minerals, folic acid, and Zinc daily  NUTRITION DIAGNOSIS:   Severe Malnutrition related to chronic illness as evidenced by energy intake < or equal to 75% for > or equal to 1 month, percent weight loss (>10% weight loss in 6 months).   GOAL:   Patient will meet greater than or equal to 90% of their needs  -progressing   MONITOR:   PO intake, Supplement acceptance, Weight trends  REASON FOR ASSESSMENT:   Consult Assessment of nutrition requirement/status, Calorie Count  ASSESSMENT:   47 y.o. M presented  from home with N/V/D x 2 weeks and right foot wound, admitted with sepsis. PMH; type 2 diabetes, CHF, BKA of left lower extremity and ongoing diabetic foot infection.  08/15/22 LBKA 04/08/23 RBKA  Pt reports not eating well since his first amputation in April. He states he has only been eating maybe 1 meal/day and eating chicken broth and mostly popsicles leading up to current admission. Pt was having nausea, vomiting, and diarrhea on admission   Currently pt states he has been doing much better and has been slowly eating more than he was at home. He reports his appetite has been slowly increasing. He does state that it takes him a long time to eat and he has pain swallowing some foods, having to drink fluids to swallow properly. Messaged the MD about speech getting involved.   Pt does not like the Ensures or Juven. Will switch to Prosource Plus d/t being less volume, more easily tolerated and also Magic cup TID. Will discontinue calorie count due to poor PO intake from 12/1.  Pt has lost 83 lbs, 22% weight loss in 6 months, pt states this was not intentional. Based on exam, pt has  lost mostly muscle. Pt qualifies for malnutrition based on current wt loss, moderate fat and muscle wasting, and Poor PO intake for greater than 1 month.   Admit weight: 145.2 kg  Current weight: 132.1 kg    04/13/23 132.1 kg  12/14/22 (!) 144.2 kg  09/10/22 (!) 156.7 kg  08/15/22 (!) 169.6 kg  06/06/22 (!) 170.1 kg   Calorie count: 12/30:   48 hour calorie count ordered.  Diet: Low sodium heart healthy regular diet, thin liquids  Supplements: Juven, Ensure Enlive   Breakfast: 100 kcal, 2 gm protein  Lunch: 155 kcal, 4 gm protein  Dinner: 404 kcal, 22 gm protein  Supplements: N/A  Total intake: 660 kcal  kcal (27% of minimum estimated needs)  28 protein (22% of minimum estimated needs)   Nutritionally Relevant Medications: Scheduled Meds:  vitamin C  1,000 mg Oral Daily   docusate sodium  100 mg Oral BID   feeding supplement  237 mL Oral BID BM   folic acid  1 mg Oral Daily   lactulose  20 g Oral Daily   multivitamin with minerals  1 tablet Oral Daily   nutrition supplement (JUVEN)  1 packet Oral BID BM   zinc sulfate (50mg  elemental zinc)  220 mg Oral Daily    Labs Reviewed: Sodium 134, Calcium 6.5,  CBG ranges from 79-84 mg/dL over the last 24 hours HgbA1c 6.8  NUTRITION - FOCUSED PHYSICAL EXAM:  Flowsheet Row  Most Recent Value  Orbital Region Moderate depletion  Upper Arm Region No depletion  Thoracic and Lumbar Region No depletion  Buccal Region Moderate depletion  Temple Region Moderate depletion  Clavicle Bone Region Moderate depletion  Clavicle and Acromion Bone Region Moderate depletion  Scapular Bone Region Moderate depletion  Dorsal Hand Moderate depletion  Patellar Region Unable to assess  Anterior Thigh Region Unable to assess  Posterior Calf Region Unable to assess  Edema (RD Assessment) None  Hair Reviewed  Eyes Reviewed  Mouth Reviewed  Skin Reviewed  Nails Reviewed       Diet Order:   Diet Order             Diet regular Room  service appropriate? Yes; Fluid consistency: Thin  Diet effective now           Diet - low sodium heart healthy                   EDUCATION NEEDS:   Education needs have been addressed  Skin:  Skin Assessment: Reviewed RN Assessment  Last BM:  04/12/2023  Height:   Ht Readings from Last 1 Encounters:  04/08/23 6\' 4"  (1.93 m)    Weight:   Wt Readings from Last 1 Encounters:  04/13/23 132.1 kg    Ideal Body Weight:  79.16 kg Adjusted for LBKA + RBKA  BMI:  Body mass index is 35.45 kg/m.  Estimated Nutritional Needs:   Kcal:  2450- 2800 kcal/d  Protein:  130-150 g/d  Fluid:  >2.2L   Elliot Dally, RD Registered Dietitian  See Amion for more information

## 2023-04-13 NOTE — Plan of Care (Signed)
  Problem: Fluid Volume: Goal: Hemodynamic stability will improve 04/13/2023 0517 by Jacelyn Grip, RN Outcome: Progressing 04/13/2023 0515 by Jacelyn Grip, RN Outcome: Progressing   Problem: Clinical Measurements: Goal: Diagnostic test results will improve 04/13/2023 0517 by Jacelyn Grip, RN Outcome: Progressing 04/13/2023 0515 by Jacelyn Grip, RN Outcome: Progressing Goal: Signs and symptoms of infection will decrease 04/13/2023 0517 by Jacelyn Grip, RN Outcome: Progressing 04/13/2023 0515 by Jacelyn Grip, RN Outcome: Progressing   Problem: Respiratory: Goal: Ability to maintain adequate ventilation will improve 04/13/2023 0517 by Jacelyn Grip, RN Outcome: Progressing 04/13/2023 0515 by Jacelyn Grip, RN Outcome: Progressing   Problem: Education: Goal: Knowledge of General Education information will improve Description: Including pain rating scale, medication(s)/side effects and non-pharmacologic comfort measures 04/13/2023 0517 by Jacelyn Grip, RN Outcome: Progressing 04/13/2023 0515 by Jacelyn Grip, RN Outcome: Progressing

## 2023-04-13 NOTE — Plan of Care (Signed)
  Problem: Fluid Volume: Goal: Hemodynamic stability will improve Outcome: Progressing   Problem: Clinical Measurements: Goal: Diagnostic test results will improve Outcome: Progressing   Problem: Clinical Measurements: Goal: Signs and symptoms of infection will decrease Outcome: Progressing   Problem: Respiratory: Goal: Ability to maintain adequate ventilation will improve Outcome: Progressing   Problem: Education: Goal: Knowledge of General Education information will improve Description: Including pain rating scale, medication(s)/side effects and non-pharmacologic comfort measures Outcome: Progressing   Problem: Nutrition: Goal: Adequate nutrition will be maintained Outcome: Progressing   Problem: Coping: Goal: Level of anxiety will decrease Outcome: Progressing   Problem: Elimination: Goal: Will not experience complications related to bowel motility Outcome: Progressing

## 2023-04-13 NOTE — Progress Notes (Signed)
PROGRESS NOTE    Alan Tapia.  UEA:[id number] DOB: 1976-04-29 DOA: 04/06/2023 PCP: Patient, No Pcp Per     Brief Narrative:  Alan Tapia. 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  He was also having nausea, vomiting and diarrhea.  Orthopedic surgery was consulted and patient underwent right BKA 11/27 by Dr. Lajoyce Corners.  New events last 24 hours / Subjective: No new issues.  Spouse at bedside.  Wants to go home.  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 Lajoyce Corners outpatient  Microcytic anemia -Combination of iron deficiency anemia and anemia of chronic disease -FOBT negative -Received total 4 unit packed red blood cells transfusions this hospitalization -Hemoglobin stable  Diabetes mellitus -A1c 6.8 -Blood sugar well-controlled  AKI  -Resolved, baseline creatinine 1.1.  No CKD    DVT  prophylaxis:  SCD's Start: 04/08/23 1658 SCDs Start: 04/07/23 0104  Code Status: Full code Family Communication: At bedside Disposition Plan: Difficult to place Status is: Inpatient Remains inpatient appropriate because: SNF placement recommended but no bed offers, does not qualify for home health due to insurance.  Continue PT while inpatient    Antimicrobials:  Anti-infectives (From admission, onward)    Start     Dose/Rate Route Frequency Ordered Stop   04/09/23 1345  doxycycline (VIBRAMYCIN) 100 mg in dextrose 5 % 250 mL IVPB        100 mg 125 mL/hr over 120 Minutes Intravenous Every 12 hours 04/09/23 1252 04/09/23 1746   04/08/23 1300  levofloxacin (LEVAQUIN) IVPB 500 mg  Status:  Discontinued        500 mg 100 mL/hr over 60 Minutes Intravenous On call to O.R. 04/08/23 1229 04/08/23 1648   04/08/23 1300  vancomycin (VANCOREADY) IVPB 1500 mg/300 mL        1,500 mg 100 mL/hr over 180 Minutes Intravenous On call to O.R. 04/08/23 1229 04/08/23 1433   04/08/23 1237  vancomycin HCl (VANCOREADY) 1500 MG/300ML IVPB       Note to Pharmacy: [name] D: cabinet override      04/08/23 1237 04/08/23 1439   04/07/23 0800  piperacillin-tazobactam (ZOSYN) IVPB 3.375 g        3.375 g 12.5 mL/hr over 240 Minutes Intravenous Every 8 hours 04/07/23 0108 04/09/23 2108   04/07/23 0100  doxycycline (VIBRAMYCIN) 100 mg in dextrose 5 % 250 mL IVPB  Status:  Discontinued        100 mg  125 mL/hr over 120 Minutes Intravenous Every 12 hours 04/07/23 0057 04/09/23 1252   04/07/23 0000  ceFEPIme (MAXIPIME) 2 g in sodium chloride 0.9 % 100 mL IVPB  Status:  Discontinued        2 g 200 mL/hr over 30 Minutes Intravenous Every 8 hours 04/06/23 2349 04/07/23 0108   04/06/23 2330  metroNIDAZOLE (FLAGYL) IVPB 500 mg        500 mg 100 mL/hr over 60 Minutes Intravenous  Once 04/06/23 2321 04/07/23 0032   04/06/23 2300  piperacillin-tazobactam (ZOSYN) IVPB 3.375 g  Status:  Discontinued        3.375  g 100 mL/hr over 30 Minutes Intravenous  Once 04/06/23 2257 04/06/23 2320   04/06/23 2300  vancomycin (VANCOREADY) IVPB 2000 mg/400 mL  Status:  Discontinued        2,000 mg 200 mL/hr over 120 Minutes Intravenous  Once 04/06/23 2257 04/06/23 2312        Objective: Vitals:   04/12/23 2120 04/13/23 0344 04/13/23 0500 04/13/23 0843  BP: (!) 140/76 134/69  139/74  Pulse: 84 90  90  Resp: 12 13  20   Temp: 98 F (36.7 C) 98 F (36.7 C)  97.9 F (36.6 C)  TempSrc: Oral Oral  Oral  SpO2: 99% 100%  99%  Weight:   132.1 kg   Height:       No intake or output data in the 24 hours ending 04/13/23 1229  Filed Weights   04/10/23 0503 04/12/23 0321 04/13/23 0500  Weight: (!) 140.1 kg (!) 137.8 kg 132.1 kg    Examination:  General exam: Appears calm and comfortable  Respiratory system: Clear to auscultation. Respiratory effort normal. No respiratory distress. No conversational dyspnea.  Cardiovascular system: S1 & S2 heard, RRR. No murmurs. No pedal edema. Gastrointestinal system: Abdomen is nondistended, soft and nontender. Normal bowel sounds heard. Central nervous system: Alert and oriented. No focal neurological deficits. Speech clear.  Extremities: Left BKA, right BKA Psychiatry: Judgement and insight appear normal. Mood & affect appropriate.   Data Reviewed: I have personally reviewed following labs and imaging studies  CBC: Recent Labs  Lab 04/06/23 2220 04/07/23 0540 04/08/23 0707 04/09/23 0619 04/09/23 1139 04/10/23 0337 04/10/23 0338 04/10/23 1338 04/11/23 0250 04/12/23 0903  WBC 23.3*   < > 13.9* 13.1*  --  9.9  --   --  10.5 11.1*  NEUTROABS 19.8*  --   --   --   --   --   --   --   --   --   HGB 7.5*   < > 8.8* 8.0*   < > 7.5* 7.5* 7.6* 7.2* 8.6*  HCT 25.4*   < > 28.0* 25.2*   < > 24.5* 24.1* 24.8* 24.0* 27.6*  MCV 74.3*   < > 76.3* 75.9*  --  77.5*  --   --  78.2* 79.5*  PLT 516*   < > 413* 416*  --  417*  --   --  431* 466*   < > = values in this interval  not displayed.   Basic Metabolic Panel: Recent Labs  Lab 04/07/23 0540 04/08/23 0707 04/09/23 0619 04/10/23 0337 04/11/23 0250  NA 135 136 133* 135 134*  K 3.5 3.6 4.0 3.5 3.6  CL 107 107 105 106 107  CO2 21* 21* 20* 24 21*  GLUCOSE 106* 103* 84 108* 100*  BUN 13 12 11 12 11   CREATININE 1.38* 1.52* 1.51* 1.38* 1.12  CALCIUM 6.9* 7.2* 6.9* 6.7* 6.5*   GFR: Estimated Creatinine Clearance: 121 mL/min (by C-G formula based on SCr of 1.12 mg/dL). Liver Function Tests: Recent Labs  Lab 04/07/23 0540 04/08/23 0707 04/09/23 0619 04/10/23 0337 04/11/23 0250  AST 12* 9* 15 12* 13*  ALT 10 12 12 12 12   ALKPHOS 261* 215* 203* 244* 255*  BILITOT 0.7 0.8 0.8 0.6 0.4  PROT 5.9* 5.8* 5.7* 5.4* 5.4*  ALBUMIN <1.5* <1.5* <1.5* <1.5* <1.5*   No results for input(s): "LIPASE", "AMYLASE" in the last 168 hours. No results for input(s): "AMMONIA" in the last 168 hours. Coagulation Profile: Recent Labs  Lab 04/06/23 2220 04/07/23 0540 04/08/23 0707  INR 1.8* 1.8* 1.6*   Cardiac Enzymes: No results for input(s): "CKTOTAL", "CKMB", "CKMBINDEX", "TROPONINI" in the last 168 hours. BNP (last 3 results) No results for input(s): "PROBNP" in the last 8760 hours. HbA1C: No results for input(s): "HGBA1C" in the last 72 hours. CBG: Recent Labs  Lab 04/11/23 1601 04/11/23 2024 04/11/23 2319 04/12/23 0320 04/12/23 0808  GLUCAP 87 97 107* 84 79   Lipid Profile: No results for input(s): "CHOL", "HDL", "LDLCALC", "TRIG", "CHOLHDL", "LDLDIRECT" in the last 72 hours. Thyroid Function Tests: No results for input(s): "TSH", "T4TOTAL", "FREET4", "T3FREE", "THYROIDAB" in the last 72 hours. Anemia Panel: Recent Labs    04/10/23 1338  TIBC NOT CALCULATED  IRON 34*   Sepsis Labs: Recent Labs  Lab 04/06/23 2242 04/07/23 0540 04/07/23 0842  PROCALCITON  --  >150.00  --   LATICACIDVEN 2.8* 1.4 1.0    Recent Results (from the past 240 hour(s))  Culture, blood (Routine x 2)     Status:  None   Collection Time: 04/06/23 10:20 PM   Specimen: BLOOD  Result Value Ref Range Status   Specimen Description   Final    BLOOD LEFT ANTECUBITAL Performed at [facility] [address] [place], [name] [address]    Special Requests   Final    BOTTLES DRAWN AEROBIC AND ANAEROBIC Blood Culture adequate volume Performed at [facility], [address] [place], [name] [address]    Culture   Final    NO GROWTH 5 DAYS Performed at [facility], [address], [place], [name] [address]    Report Status 04/11/2023 FINAL  Final  Culture, blood (Routine x 2)     Status: None   Collection Time: 04/06/23 10:32 PM   Specimen: BLOOD  Result Value Ref Range Status   Specimen Description   Final    BLOOD BLOOD LEFT FOREARM Performed at [facility], [address], [place], [name] [address]    Special Requests   Final    BOTTLES DRAWN AEROBIC AND ANAEROBIC Blood Culture results may not be optimal due to an inadequate volume of blood received in culture bottles Performed at [facility], [address] [place], [name] [address]    Culture   Final    NO GROWTH 5 DAYS Performed at [facility], [address], [place], [name] [address]    Report Status 04/11/2023 FINAL  Final  Respiratory (~20 pathogens) panel by PCR     Status: None   Collection Time: 04/07/23  1:04 AM   Specimen: Nasopharyngeal Swab; Respiratory  Result Value Ref Range Status   Adenovirus NOT DETECTED NOT DETECTED Final   Coronavirus 229E NOT DETECTED NOT DETECTED Final    Comment: (NOTE) The Coronavirus on the Respiratory  Panel, DOES NOT test for the novel  Coronavirus (2019 nCoV)    Coronavirus HKU1 NOT DETECTED NOT DETECTED Final   Coronavirus NL63 NOT DETECTED NOT DETECTED Final   Coronavirus OC43 NOT DETECTED NOT DETECTED Final   Metapneumovirus NOT DETECTED NOT DETECTED Final   Rhinovirus / Enterovirus NOT  DETECTED NOT DETECTED Final   Influenza A NOT DETECTED NOT DETECTED Final   Influenza B NOT DETECTED NOT DETECTED Final   Parainfluenza Virus 1 NOT DETECTED NOT DETECTED Final   Parainfluenza Virus 2 NOT DETECTED NOT DETECTED Final   Parainfluenza Virus 3 NOT DETECTED NOT DETECTED Final   Parainfluenza Virus 4 NOT DETECTED NOT DETECTED Final   Respiratory Syncytial Virus NOT DETECTED NOT DETECTED Final   Bordetella pertussis NOT DETECTED NOT DETECTED Final   Bordetella Parapertussis NOT DETECTED NOT DETECTED Final   Chlamydophila pneumoniae NOT DETECTED NOT DETECTED Final   Mycoplasma pneumoniae NOT DETECTED NOT DETECTED Final    Comment: Performed at [facility], [address], [place], [name] [address]  Culture, blood (routine x 2) Call MD if unable to obtain prior to antibiotics being given     Status: None   Collection Time: 04/07/23  5:40 AM   Specimen: BLOOD RIGHT ARM  Result Value Ref Range Status   Specimen Description   Final    BLOOD RIGHT ARM Performed at [facility], [address] [place], [name] [address]    Special Requests   Final    BOTTLES DRAWN AEROBIC ONLY Blood Culture adequate volume Performed at [facility], [address], [place], [name] [address]    Culture   Final    NO GROWTH 5 DAYS Performed at [facility], [address], [place], [name] [address]    Report Status 04/12/2023 FINAL  Final  Culture, blood (routine x 2) Call MD if unable to obtain prior to antibiotics being given     Status: None   Collection Time: 04/07/23  5:40 AM   Specimen: BLOOD RIGHT HAND  Result Value Ref Range Status   Specimen Description   Final    BLOOD RIGHT HAND Performed at [facility], [address], [place], [name] [address]    Special Requests   Final    BOTTLES DRAWN AEROBIC ONLY Blood Culture adequate volume Performed at [facility], [address], [place], [name] [address]    Culture    Final    NO GROWTH 5 DAYS Performed at [facility], [address], [place], [name] [address]    Report Status 04/12/2023 FINAL  Final  Surgical pcr screen     Status: None   Collection Time: 04/08/23  2:10 AM   Specimen: Nasal Mucosa; Nasal Swab  Result Value Ref Range Status   MRSA, PCR NEGATIVE NEGATIVE Final   Staphylococcus aureus NEGATIVE NEGATIVE Final    Comment: (NOTE) The Xpert SA Assay (FDA approved for NASAL specimens in patients 25 years of age and older), is one component of a comprehensive surveillance program. It is not intended to diagnose infection nor to guide or monitor treatment. Performed at [facility], [address], [place], [name] [address]       Radiology Studies: No results found.    Scheduled Meds:  vitamin C  1,000 mg Oral Daily   docusate sodium  100 mg Oral BID   feeding supplement  237 mL Oral BID BM  folic acid  1 mg Oral Daily   lactulose  20 g Oral Daily   multivitamin with minerals  1 tablet Oral Daily   nutrition supplement (JUVEN)  1 packet Oral BID BM   pantoprazole  40 mg Oral BID   sodium chloride flush  10 mL Intravenous Q12H   sodium chloride flush  3 mL Intravenous Q12H   zinc sulfate (50mg  elemental zinc)  220 mg Oral Daily   Continuous Infusions:   LOS: 6 days   Time spent: 25 minutes   Noralee Stain, DO [facility] 04/13/2023, 12:29 PM   Available via Epic secure chat 7am-7pm After these hours, please refer to coverage provider listed on [email or web address]

## 2023-04-13 NOTE — TOC Progression Note (Signed)
Transition of Care (TOC) - Progression Note    Patient Details  Name: Alan Tapia. MRN: [id number] Date of Birth: 02-13-1976  Transition of Care [facility]) CM/SW Contact  Ariyanna Oien A [place], [place] Phone Number: 04/13/2023, 3:30 PM  Clinical Narrative:     CSW contacted pt's wife, as rehab team informed CSW that they were requesting conversation. Pt answered phone, stated that wife was hoping for possible placement because pt states they are facing a possible eviction. CSW explained that unfortunately his insurance does not have benefits for short term rehab or home health. CSW to speak with pt and wife about disposition.    TOC will continue to follow.   Expected Discharge Plan: Skilled Nursing Facility Barriers to Discharge: Insurance Authorization, Continued Medical Work up, SNF Pending bed offer  Expected Discharge Plan and Services In-house Referral: Clinical Social Work     Living arrangements for the past 2 months: Single Family Home Expected Discharge Date: 04/11/23                                     Social Determinants of Health (SDOH) Interventions SDOH Screenings   Food Insecurity: No Food Insecurity (04/07/2023)  Housing: Medium Risk (04/07/2023)  Transportation Needs: No Transportation Needs (04/07/2023)  Recent Concern: Transportation Needs - Unmet Transportation Needs (02/17/2023)   Received from [facility]  Utilities: At Risk (04/07/2023)  Alcohol Screen: Low Risk  (08/20/2022)  Financial Resource Strain: Patient Unable To Answer (08/20/2022)  Tobacco Use: Low Risk  (04/08/2023)    Readmission Risk Interventions    04/07/2023    2:34 PM  Readmission Risk Prevention Plan  Transportation Screening Complete  PCP or Specialist Appt within 3-5 Days Complete  HRI or Home Care Consult Complete  Social Work Consult for Recovery Care Planning/Counseling Complete  Palliative Care Screening Not Applicable  Medication Review Oceanographer) Complete

## 2023-04-14 DIAGNOSIS — A419 Sepsis, unspecified organism: Secondary | ICD-10-CM | POA: Diagnosis not present

## 2023-04-14 DIAGNOSIS — E440 Moderate protein-calorie malnutrition: Secondary | ICD-10-CM

## 2023-04-14 DIAGNOSIS — N179 Acute kidney failure, unspecified: Secondary | ICD-10-CM | POA: Diagnosis not present

## 2023-04-14 DIAGNOSIS — R6520 Severe sepsis without septic shock: Secondary | ICD-10-CM | POA: Diagnosis not present

## 2023-04-14 NOTE — Progress Notes (Signed)
Pt c/o injury/pain of scrotum.  Exam performed x 2 RN & NT.  MASD noted.  Pt and wife educated

## 2023-04-14 NOTE — Progress Notes (Addendum)
Physical Therapy Treatment Patient Details Name: Alan Tapia. MRN: [id number] DOB: Oct 17, 1975 Today's Date: 04/14/2023   History of Present Illness The pt is a 47 yo male presenting 11/25 with nausea, vomiting, and fever as well as R foot wound. 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    PT Comments  Pt agreeable to mobility. Pt reports using a wheel chair and slide board to transfer at baseline. Pt required max cueing and significantly increased time for set up and completion of all mobility. Pt maxA rolling to L side modA rolling R. modA elevating trunk from elevated HOB, pt unable to fully lift trunk on own with HOB at 30 degrees and bilateral use of bed rails. Pt was unable to fully transfer to chair, pt attempted to use slide board to scoot to recliner 3x, pt unreceptive to therapist cueing and instruction for completion of task, insisting he knows how to do and just needs to take his time, pts form is very unorthodox, constant cueing for hand placement and scooting, pt unable to scoot functionally enough to make board transfer viable at this time. When setting up board transfer and various bed mobility pt would rapidly fling self back to supine/sidelying position from seated. Pt refused use of maximove after set up, lift pad was attached but not taut and no elevation was achieved, due to it "ripping straight meat" and "tearing" his groin. Pt returned to bed nursing informed of attempts and current groin pain.      If plan is discharge home, recommend the following: Two people to help with walking and/or transfers;Assist for transportation;Assistance with cooking/housework;Help with stairs or ramp for entrance   Can travel by private vehicle     No  Equipment Recommendations  Hoyer lift;Wheelchair cushion (measurements  PT);Wheelchair (measurements PT)    Recommendations for Other Services       Precautions / Restrictions Precautions Precautions: Fall;Other (comment) Precaution Comments: RLE limb protector Restrictions Weight Bearing Restrictions: No Other Position/Activity Restrictions: bil BKA     Mobility  Bed Mobility Overal bed mobility: Needs Assistance Bed Mobility: Supine to Sit, Rolling, Sit to Supine Rolling: Max assist   Supine to sit: Mod assist, +2 for physical assistance, +2 for safety/equipment, HOB elevated, Used rails Sit to supine: Min assist, +2 for physical assistance, +2 for safety/equipment   General bed mobility comments: pt rolls R very well, requires maxA to roll L, modA to elevate trunk from [facility] at 20 degrees (pt reports having hospital bed at home) modA for LE assist throughout, pt LLE would often be off the EOB and would need help beinging back into bed, maxAx2 to slide towards HOB, all bed mobility trialed multiple times with varying level of assist as pt would stop and think about how to do taksk then need to restart.    Transfers Overall transfer level: Needs assistance Equipment used: Sliding board Transfers: Bed to chair/wheelchair/BSC            Lateral/Scoot Transfers: Total assist, From elevated surface, With slide board, +2 physical assistance, +2 safety/equipment General transfer comment: unable to fully transfer to chair, pt attempted to use slide board to scoot to recliner 3x, required majorly increased time for set up and facilitation of task, constant cueing for hand placement and scooting, pt unable to scoot functionally enough to  make board transfer viable at this time. Pt refused use fo lift after set up due to it "ripping straight meat" and "tearing" his groin Transfer via Lift Equipment: Maximove  Ambulation/Gait                   Stairs             Wheelchair Mobility     Tilt Bed    Modified Rankin (Stroke Patients  Only)       Balance Overall balance assessment: Needs assistance Sitting-balance support: Bilateral upper extremity supported, Single extremity supported, Feet unsupported Sitting balance-Leahy Scale: Poor Sitting balance - Comments: constantly reliant on BUE support EOB with min-mod external assist needed during transfers                                    Cognition Arousal: Alert Behavior During Therapy: WFL for tasks assessed/performed Overall Cognitive Status: Within Functional Limits for tasks assessed                                          Exercises      General Comments        Pertinent Vitals/Pain Pain Assessment Pain Score: 10-Worst pain ever Pain Location: groin with movement Pain Descriptors / Indicators: Sharp Pain Intervention(s): Monitored during session, Limited activity within patient's tolerance    Home Living                          Prior Function            PT Goals (current goals can now be found in the care plan section) Acute Rehab PT Goals Patient Stated Goal: get bil prosthetics    Frequency    Min 1X/week      PT Plan      Co-evaluation              AM-PAC PT "6 Clicks" Mobility   Outcome Measure  Help needed turning from your back to your side while in a flat bed without using bedrails?: A Lot Help needed moving from lying on your back to sitting on the side of a flat bed without using bedrails?: Total Help needed moving to and from a bed to a chair (including a wheelchair)?: Total Help needed standing up from a chair using your arms (e.g., wheelchair or bedside chair)?: Total Help needed to walk in hospital room?: Total Help needed climbing 3-5 steps with a railing? : Total 6 Click Score: 7    End of Session   Activity Tolerance: Patient limited by fatigue Patient left: in bed;with call bell/phone within reach;with family/visitor present Nurse Communication: Mobility  status;Need for lift equipment PT Visit Diagnosis: Other abnormalities of gait and mobility (R26.89)     Time: 3474-2595 PT Time Calculation (min) (ACUTE ONLY): 65 min  Charges:    $Therapeutic Activity: 53-67 mins PT General Charges $$ ACUTE PT VISIT: 1 Visit                     Andrey Farmer. SPT Secure chat preferred    Darlin Drop 04/14/2023, 2:12 PM

## 2023-04-14 NOTE — Evaluation (Signed)
SLP Cancellation Note  Patient Details Name: Alan Tapia. MRN: [id number] DOB: 1975-10-24   Cancelled treatment:       Reason Eval/Treat Not Completed: SLP screened, no needs identified, will sign off. Spoke with patient who stated difficulty/pain with swallowing has resolved at this time. No further complaints or concerns. Please reconsult ST as needed. Thank you.  Marline Backbone, Senaida Lange., Speech Therapy Student   04/14/2023, 3:54 PM

## 2023-04-14 NOTE — TOC Progression Note (Signed)
Transition of Care (TOC) - Progression Note    Patient Details  Name: Alan Tapia. MRN: [id number] Date of Birth: 1976-05-11  Transition of Care [facility]) CM/SW Contact  Eduard Roux, [name] Phone Number: 04/14/2023, 4:24 PM  Clinical Narrative:     CSW received call from Andrena Mews w/ [facility], (ART) # [phone]. ART is involved because they attempting to work with [employer] to get handicapped accessible apartment for patient. Hale Bogus reports the patient has disability pending, she have Chiropractor to see eviction could be delayed, including contacting colleagues to inquire about regular Medicaid. She states she will call CSW once she received contact back from [employer]. CSW informed of placement barrier: insurance does NOT cover SNF or HH.   TOC sent email to [facility] to assist with Medicaid and or Disability.   TOC will continue to follow and assist with discharge planning.  Antony Blackbird, MSW, LCSW Clinical Social Worker     Expected Discharge Plan: Skilled Nursing Facility Barriers to Discharge: Insurance Authorization, Continued Medical Work up, SNF Pending bed offer  Expected Discharge Plan and Services In-house Referral: Clinical Social Work     Living arrangements for the past 2 months: Single Family Home Expected Discharge Date: 04/11/23                                     Social Determinants of Health (SDOH) Interventions SDOH Screenings   Food Insecurity: No Food Insecurity (04/07/2023)  Housing: Medium Risk (04/07/2023)  Transportation Needs: No Transportation Needs (04/07/2023)  Recent Concern: Transportation Needs - Unmet Transportation Needs (02/17/2023)   Received from [facility]  Utilities: At Risk (04/07/2023)  Alcohol Screen: Low Risk  (08/20/2022)  Financial Resource Strain: Patient Unable To Answer (08/20/2022)  Tobacco Use: Low Risk  (04/08/2023)     Readmission Risk Interventions    04/07/2023    2:34 PM  Readmission Risk Prevention Plan  Transportation Screening Complete  PCP or Specialist Appt within 3-5 Days Complete  HRI or Home Care Consult Complete  Social Work Consult for Recovery Care Planning/Counseling Complete  Palliative Care Screening Not Applicable  Medication Review Oceanographer) Complete

## 2023-04-14 NOTE — Progress Notes (Addendum)
Nutrition Brief Note  Last nutrition follow up was 04/13/2023. At this visit, discussed with pt the importance of protein intake for wound healing and different protein options. [name] stopped by the office. She said pt did not want to eat his Lunch because there were no proteins on his plate. Pt is room service appropriate and is able to order his own meals. Will move pt to room service with assist and add extra proteins to his meals.   NUTRITION DIAGNOSIS:    Moderate Malnutrition related to chronic illness as evidenced by energy intake < or equal to 75% for > or equal to 1 month, moderate fat depletion, moderate muscle depletion.    GOAL:    Patient will meet greater than or equal to 90% of their needs  INTERVENTION:   - Continue Magic cup TID with meals, each supplement provides 290 kcal and 9 grams of protein - Continue Prosource Plus BID - Continue MVI with minerals, folic acid, and Zinc daily - Add double protein to meals - Change to room service with assist  If nutrition issues arise, please consult RD.   Elliot Dally, RD Registered Dietitian  See Amion for more information

## 2023-04-14 NOTE — Progress Notes (Signed)
PROGRESS NOTE    Alan Tapia.  RUE:[id number] DOB: [birth year] DOA: 04/06/2023 PCP: Patient, No Pcp Per     Brief Narrative:  Alan Tapia. 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  He was also having nausea, vomiting and diarrhea.  Orthopedic surgery was consulted and patient underwent right BKA 11/27 by Dr. Lajoyce Corners.  New events last 24 hours / Subjective: No new issues.  Spouse at bedside.  Working with PT and other insurance barriers   Assessment & Plan:   Principal Problem:   Sepsis (HCC) Active Problems:   Acute osteomyelitis of right foot (HCC)   Acute on chronic anemia   Metabolic acidosis   CAP (community acquired pneumonia)   Diarrhea   Essential hypertension   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)   Insulin dependent type 2 diabetes mellitus (HCC)   CKD (chronic kidney disease), stage II   Hx of left BKA (HCC)   PVD (peripheral vascular disease) (HCC)   Subacute osteomyelitis of right foot (HCC)   Cutaneous abscess of right foot   Subacute osteomyelitis, right ankle and foot (HCC)   Malnutrition of moderate degree   Sepsis secondary to nonhealing right foot ulcer, diabetic foot -Previous history of left BKA -Status post right BKA 11/24 -Blood cultures negative -Follow-up with Dr. Lajoyce Corners outpatient  Microcytic anemia -Combination of iron deficiency anemia and anemia of chronic disease -FOBT negative -Received total 4 unit packed red blood cells transfusions this hospitalization -Hemoglobin stable  Diabetes mellitus -A1c 6.8 -Blood sugar well-controlled  AKI  -Resolved, baseline creatinine  1.1.  No CKD    DVT prophylaxis:  SCD's Start: 04/08/23 1658 SCDs Start: 04/07/23 0104  Code Status: Full code Family Communication: At bedside Disposition Plan: Difficult to place Status is: Inpatient Remains inpatient appropriate because: SNF placement recommended but no bed offers, does not qualify for home health due to insurance.  Continue PT while inpatient    Antimicrobials:  Anti-infectives (From admission, onward)    Start     Dose/Rate Route Frequency Ordered Stop   04/09/23 1345  doxycycline (VIBRAMYCIN) 100 mg in dextrose 5 % 250 mL IVPB        100 mg 125 mL/hr over 120 Minutes Intravenous Every 12 hours 04/09/23 1252 04/09/23 1746   04/08/23 1300  levofloxacin (LEVAQUIN) IVPB 500 mg  Status:  Discontinued        500 mg 100 mL/hr over 60 Minutes Intravenous On call to O.R. 04/08/23 1229 04/08/23 1648   04/08/23 1300  vancomycin (VANCOREADY) IVPB 1500 mg/300 mL        1,500 mg 100 mL/hr over 180 Minutes Intravenous On call to O.R. 04/08/23 1229 04/08/23 1433   04/08/23 1237  vancomycin HCl (VANCOREADY) 1500 MG/300ML IVPB       Note to Pharmacy: [name] D: cabinet override      04/08/23 1237 04/08/23 1439   04/07/23 0800  piperacillin-tazobactam (ZOSYN) IVPB 3.375 g        3.375 g 12.5 mL/hr over 240 Minutes Intravenous Every 8 hours 04/07/23 0108 04/09/23 2108   04/07/23 0100  doxycycline (VIBRAMYCIN) 100 mg in dextrose 5 % 250 mL IVPB  Status:  Discontinued  100 mg 125 mL/hr over 120 Minutes Intravenous Every 12 hours 04/07/23 0057 04/09/23 1252   04/07/23 0000  ceFEPIme (MAXIPIME) 2 g in sodium chloride 0.9 % 100 mL IVPB  Status:  Discontinued        2 g 200 mL/hr over 30 Minutes Intravenous Every 8 hours 04/06/23 2349 04/07/23 0108   04/06/23 2330  metroNIDAZOLE (FLAGYL) IVPB 500 mg        500 mg 100 mL/hr over 60 Minutes Intravenous  Once 04/06/23 2321 04/07/23 0032   04/06/23 2300  piperacillin-tazobactam (ZOSYN) IVPB 3.375 g  Status:   Discontinued        3.375 g 100 mL/hr over 30 Minutes Intravenous  Once 04/06/23 2257 04/06/23 2320   04/06/23 2300  vancomycin (VANCOREADY) IVPB 2000 mg/400 mL  Status:  Discontinued        2,000 mg 200 mL/hr over 120 Minutes Intravenous  Once 04/06/23 2257 04/06/23 2312        Objective: Vitals:   04/13/23 2313 04/14/23 0305 04/14/23 0748 04/14/23 1223  BP: (!) 149/82 (!) 148/77 (!) 149/88 (!) 141/75  Pulse: 89 87 88 95  Resp: 18 18 19 20   Temp: 98 F (36.7 C) (!) 97.5 F (36.4 C) 97.9 F (36.6 C) 98.2 F (36.8 C)  TempSrc: Oral Oral Oral Oral  SpO2: 99% 100% 97% 97%  Weight:  (!) 136.8 kg    Height:        Intake/Output Summary (Last 24 hours) at 04/14/2023 1425 Last data filed at 04/14/2023 0809 Gross per 24 hour  Intake 120 ml  Output 1525 ml  Net -1405 ml    Filed Weights   04/12/23 0321 04/13/23 0500 04/14/23 0305  Weight: (!) 137.8 kg 132.1 kg (!) 136.8 kg    Examination:  General exam: Appears calm and comfortable  Respiratory system: Clear to auscultation. Respiratory effort normal. No respiratory distress. No conversational dyspnea.  Cardiovascular system: S1 & S2 heard, RRR. No murmurs. No pedal edema. Gastrointestinal system: Abdomen is nondistended, soft and nontender. Normal bowel sounds heard. Central nervous system: Alert and oriented. No focal neurological deficits. Speech clear.  Extremities: Left BKA, right BKA Psychiatry: Judgement and insight appear normal. Mood & affect appropriate.   Data Reviewed: I have personally reviewed following labs and imaging studies  CBC: Recent Labs  Lab 04/08/23 0707 04/09/23 0619 04/09/23 1139 04/10/23 0337 04/10/23 0338 04/10/23 1338 04/11/23 0250 04/12/23 0903  WBC 13.9* 13.1*  --  9.9  --   --  10.5 11.1*  HGB 8.8* 8.0*   < > 7.5* 7.5* 7.6* 7.2* 8.6*  HCT 28.0* 25.2*   < > 24.5* 24.1* 24.8* 24.0* 27.6*  MCV 76.3* 75.9*  --  77.5*  --   --  78.2* 79.5*  PLT 413* 416*  --  417*  --   --  431* 466*    < > = values in this interval not displayed.   Basic Metabolic Panel: Recent Labs  Lab 04/08/23 0707 04/09/23 0619 04/10/23 0337 04/11/23 0250  NA 136 133* 135 134*  K 3.6 4.0 3.5 3.6  CL 107 105 106 107  CO2 21* 20* 24 21*  GLUCOSE 103* 84 108* 100*  BUN 12 11 12 11   CREATININE 1.52* 1.51* 1.38* 1.12  CALCIUM 7.2* 6.9* 6.7* 6.5*   GFR: Estimated Creatinine Clearance: 123.2 mL/min (by C-G formula based on SCr of 1.12 mg/dL). Liver Function Tests: Recent Labs  Lab 04/08/23 [phone] 04/09/23 [phone] 04/10/23 0337 04/11/23 0250  AST 9* 15 12* 13*  ALT 12 12 12 12   ALKPHOS 215* 203* 244* 255*  BILITOT 0.8 0.8 0.6 0.4  PROT 5.8* 5.7* 5.4* 5.4*  ALBUMIN <1.5* <1.5* <1.5* <1.5*   No results for input(s): "LIPASE", "AMYLASE" in the last 168 hours. No results for input(s): "AMMONIA" in the last 168 hours. Coagulation Profile: Recent Labs  Lab 04/08/23 0707  INR 1.6*   Cardiac Enzymes: No results for input(s): "CKTOTAL", "CKMB", "CKMBINDEX", "TROPONINI" in the last 168 hours. BNP (last 3 results) No results for input(s): "PROBNP" in the last 8760 hours. HbA1C: No results for input(s): "HGBA1C" in the last 72 hours. CBG: Recent Labs  Lab 04/11/23 1601 04/11/23 2024 04/11/23 2319 04/12/23 0320 04/12/23 0808  GLUCAP 87 97 107* 84 79   Lipid Profile: No results for input(s): "CHOL", "HDL", "LDLCALC", "TRIG", "CHOLHDL", "LDLDIRECT" in the last 72 hours. Thyroid Function Tests: No results for input(s): "TSH", "T4TOTAL", "FREET4", "T3FREE", "THYROIDAB" in the last 72 hours. Anemia Panel: No results for input(s): "VITAMINB12", "FOLATE", "FERRITIN", "TIBC", "IRON", "RETICCTPCT" in the last 72 hours.  Sepsis Labs: No results for input(s): "PROCALCITON", "LATICACIDVEN" in the last 168 hours.   Recent Results (from the past 240 hour(s))  Culture, blood (Routine x 2)     Status: None   Collection Time: 04/06/23 10:20 PM   Specimen: BLOOD  Result Value Ref Range Status    Specimen Description   Final    BLOOD LEFT ANTECUBITAL Performed at [facility], [address] [place], [name] [address]    Special Requests   Final    BOTTLES DRAWN AEROBIC AND ANAEROBIC Blood Culture adequate volume Performed at [facility], [address], [place], [name] [address]    Culture   Final    NO GROWTH 5 DAYS Performed at [facility], [address], [place], [name] [address]    Report Status 04/11/2023 FINAL  Final  Culture, blood (Routine x 2)     Status: None   Collection Time: 04/06/23 10:32 PM   Specimen: BLOOD  Result Value Ref Range Status   Specimen Description   Final    BLOOD BLOOD LEFT FOREARM Performed at [facility], [address] [place], [name] [address]    Special Requests   Final    BOTTLES DRAWN AEROBIC AND ANAEROBIC Blood Culture results may not be optimal due to an inadequate volume of blood received in culture bottles Performed at [facility], [address], [place], [name] [address]    Culture   Final    NO GROWTH 5 DAYS Performed at [facility], [address] [place], [name] [address]    Report Status 04/11/2023 FINAL  Final  Respiratory (~20 pathogens) panel by PCR     Status: None   Collection Time: 04/07/23  1:04 AM   Specimen: Nasopharyngeal Swab; Respiratory  Result Value Ref Range Status   Adenovirus NOT DETECTED NOT DETECTED Final   Coronavirus 229E NOT DETECTED NOT DETECTED Final    Comment: (NOTE) The Coronavirus on the Respiratory Panel, DOES NOT test for the novel  Coronavirus (2019 nCoV)    Coronavirus HKU1 NOT DETECTED NOT DETECTED Final   Coronavirus NL63 NOT DETECTED NOT DETECTED Final   Coronavirus OC43 NOT DETECTED NOT DETECTED Final   Metapneumovirus NOT DETECTED NOT DETECTED Final   Rhinovirus / Enterovirus NOT DETECTED NOT DETECTED Final   Influenza A NOT DETECTED NOT DETECTED Final  Influenza B NOT DETECTED  NOT DETECTED Final   Parainfluenza Virus 1 NOT DETECTED NOT DETECTED Final   Parainfluenza Virus 2 NOT DETECTED NOT DETECTED Final   Parainfluenza Virus 3 NOT DETECTED NOT DETECTED Final   Parainfluenza Virus 4 NOT DETECTED NOT DETECTED Final   Respiratory Syncytial Virus NOT DETECTED NOT DETECTED Final   Bordetella pertussis NOT DETECTED NOT DETECTED Final   Bordetella Parapertussis NOT DETECTED NOT DETECTED Final   Chlamydophila pneumoniae NOT DETECTED NOT DETECTED Final   Mycoplasma pneumoniae NOT DETECTED NOT DETECTED Final    Comment: Performed at [facility], [address] [place], [name] [address]  Culture, blood (routine x 2) Call MD if unable to obtain prior to antibiotics being given     Status: None   Collection Time: 04/07/23  5:40 AM   Specimen: BLOOD RIGHT ARM  Result Value Ref Range Status   Specimen Description   Final    BLOOD RIGHT ARM Performed at [facility], [address], [place], [name] [address]    Special Requests   Final    BOTTLES DRAWN AEROBIC ONLY Blood Culture adequate volume Performed at [facility], [address], [place], [name] [address]    Culture   Final    NO GROWTH 5 DAYS Performed at [facility], [address] [place], [name] [address]    Report Status 04/12/2023 FINAL  Final  Culture, blood (routine x 2) Call MD if unable to obtain prior to antibiotics being given     Status: None   Collection Time: 04/07/23  5:40 AM   Specimen: BLOOD RIGHT HAND  Result Value Ref Range Status   Specimen Description   Final    BLOOD RIGHT HAND Performed at [facility], [address], [place], [name] [address]    Special Requests   Final    BOTTLES DRAWN AEROBIC ONLY Blood Culture adequate volume Performed at [facility], [address] [place], [name] [address]    Culture   Final    NO GROWTH 5 DAYS Performed at [facility], [address], [place], [name]  [address]    Report Status 04/12/2023 FINAL  Final  Surgical pcr screen     Status: None   Collection Time: 04/08/23  2:10 AM   Specimen: Nasal Mucosa; Nasal Swab  Result Value Ref Range Status   MRSA, PCR NEGATIVE NEGATIVE Final   Staphylococcus aureus NEGATIVE NEGATIVE Final    Comment: (NOTE) The Xpert SA Assay (FDA approved for NASAL specimens in patients 56 years of age and older), is one component of a comprehensive surveillance program. It is not intended to diagnose infection nor to guide or monitor treatment. Performed at [facility], [address] [place], [name] [address]       Radiology Studies: No results found.    Scheduled Meds:  (feeding supplement) PROSource Plus  30 mL Oral BID BM   vitamin C  1,000 mg Oral Daily   docusate sodium  100 mg Oral BID   feeding supplement  237 mL Oral BID BM   folic acid  1 mg Oral Daily   Gerhardt's butt cream   Topical BID   lactulose  20 g Oral Daily   multivitamin with minerals  1 tablet Oral Daily   pantoprazole  40 mg Oral BID   sodium chloride flush  10 mL Intravenous Q12H   sodium chloride  flush  3 mL Intravenous Q12H   zinc sulfate (50mg  elemental zinc)  220 mg Oral Daily   Continuous Infusions:   LOS: 7 days   Time spent: 20 minutes   Noralee Stain, DO [facility] 04/14/2023, 2:25 PM   Available via Epic secure chat 7am-7pm After these hours, please refer to coverage provider listed on [email or web address]

## 2023-04-15 DIAGNOSIS — L089 Local infection of the skin and subcutaneous tissue, unspecified: Secondary | ICD-10-CM

## 2023-04-15 DIAGNOSIS — D649 Anemia, unspecified: Secondary | ICD-10-CM | POA: Diagnosis not present

## 2023-04-15 DIAGNOSIS — N182 Chronic kidney disease, stage 2 (mild): Secondary | ICD-10-CM

## 2023-04-15 DIAGNOSIS — E872 Acidosis, unspecified: Secondary | ICD-10-CM

## 2023-04-15 DIAGNOSIS — E11628 Type 2 diabetes mellitus with other skin complications: Secondary | ICD-10-CM | POA: Diagnosis not present

## 2023-04-15 DIAGNOSIS — A419 Sepsis, unspecified organism: Secondary | ICD-10-CM | POA: Diagnosis not present

## 2023-04-15 DIAGNOSIS — M86171 Other acute osteomyelitis, right ankle and foot: Secondary | ICD-10-CM | POA: Diagnosis not present

## 2023-04-15 MED ORDER — SIMETHICONE 80 MG PO CHEW
160.0000 mg | CHEWABLE_TABLET | Freq: Three times a day (TID) | ORAL | Status: AC
  Administered 2023-04-15 (×4): 160 mg via ORAL
  Filled 2023-04-15 (×4): qty 2

## 2023-04-15 MED ORDER — ENOXAPARIN SODIUM 40 MG/0.4ML IJ SOSY
40.0000 mg | PREFILLED_SYRINGE | INTRAMUSCULAR | Status: DC
  Administered 2023-04-15 – 2023-04-30 (×16): 40 mg via SUBCUTANEOUS
  Filled 2023-04-15 (×16): qty 0.4

## 2023-04-15 NOTE — TOC Progression Note (Addendum)
Transition of Care (TOC) - Progression Note    Patient Details  Name: Alan Tapia. MRN: [id number] Date of Birth: 1975-07-13  Transition of Care [facility]) CM/SW Contact  Carley Hammed, LCSW Phone Number: 04/15/2023, 12:22 PM  Clinical Narrative:    CSW reviewed pt on admission to the unit and noted ongoing placement issues. CSW followed up with Andrena Mews at [facility] ([facility]) and advised that pt had been moved and CSW was now best contact. Hale Bogus noted [facility] is working to change pt's Medicaid to assist with placement. CSW advised that pt will also need to have his disability approved in order to qualify, it is currently pending.  Hale Bogus noted she has been working with the couple and states they are hot and cold. Hale Bogus states pt will say he is going home with wife and then also state he is leaving her. [facility] wants to assist with services, but is unsure of what pt wants to do. Hale Bogus asked if CSW would meet with pt without wife to discuss, spouse has been at bedside all day.  Hale Bogus has been working to assist spouse with eviction and get an accessible apartment. CSW will meet with pt when able to solidify disposition. At this time there is no DC plan in place. TOC will continue to follow.   2:00 CSW met with pt at bedside, wife not present. Per pt he states he is under the impression that he will be discharging home with wife as he has no other choice. CSW asked if he has concerns for his safety and he states it is just a strained relationship. Per pt, his mother is coming in and will be providing additional support. CSW asked for mother's number, pt did not have his phone, CSW # provided to pt and requested mother, Annice Pih, reach out.   Pt's spouse stopped by the office and requested documentation to provide to apartment manager to get pt moved to an accessible apartment.Per spouse, apartment manager has approved this and just needs a letter signifying the medical need.  CSW to provide letter.   CSW followed up with Hale Bogus who noted she has been unable to get in contact with anyone at housing authority. She also advises that pt's spouse is not always a reliable source of information, so additional fact checking may need to take place. CSW will follow up with spouse on her apartment manager contact and her housing authority contact to verify plan. TOC will continue to follow.   Expected Discharge Plan: Skilled Nursing Facility Barriers to Discharge: Insurance Authorization, Continued Medical Work up, SNF Pending bed offer  Expected Discharge Plan and Services In-house Referral: Clinical Social Work     Living arrangements for the past 2 months: Single Family Home Expected Discharge Date: 04/11/23                                     Social Determinants of Health (SDOH) Interventions SDOH Screenings   Food Insecurity: No Food Insecurity (04/07/2023)  Housing: Medium Risk (04/07/2023)  Transportation Needs: No Transportation Needs (04/07/2023)  Recent Concern: Transportation Needs - Unmet Transportation Needs (02/17/2023)   Received from [facility]  Utilities: At Risk (04/07/2023)  Alcohol Screen: Low Risk  (08/20/2022)  Financial Resource Strain: Patient Unable To Answer (08/20/2022)  Tobacco Use: Low Risk  (04/08/2023)    Readmission Risk Interventions    04/07/2023  2:34 PM  Readmission Risk Prevention Plan  Transportation Screening Complete  PCP or Specialist Appt within 3-5 Days Complete  HRI or Home Care Consult Complete  Social Work Consult for Recovery Care Planning/Counseling Complete  Palliative Care Screening Not Applicable  Medication Review Oceanographer) Complete

## 2023-04-15 NOTE — Progress Notes (Signed)
Patient ID: Alan Tapia., male   DOB: 11/30/1975, 47 y.o.   MRN: [id number] Patient is 1 week status post transtibial amputation.  Discussed with the patient and his wife that they will need appropriate housing at time of discharge.  Patient and family states that social worker is helping them with this.  Plan to discontinue the wound VAC at time of discharge.

## 2023-04-15 NOTE — Progress Notes (Signed)
[facility]  PROGRESS NOTE  Alan Tapia. 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 He was also having nausea, vomiting and diarrhea. Orthopedic surgery was consulted and patient underwent right BKA 11/27 by Dr. Lajoyce Corners.     Assessment/Plan:   Sepsis secondary to nonhealing right foot ulcer, diabetic foot -Previous history of left BKA -Status post right BKA 11/24 -Blood cultures negative -Follow-up with Dr. Lajoyce Corners outpatient -Wound VAC will be removed at the time of discharge as per Dr. Lajoyce Corners   Microcytic anemia -Combination of iron deficiency anemia and anemia of chronic disease -FOBT negative -Received total 4 unit packed red blood cells transfusions this hospitalization -Hemoglobin stable at 8.6   Diabetes mellitus -A1c 6.8 -Blood sugar well-controlled   AKI  -Resolved, baseline creatinine 1.1.  No CKD    Medications     (feeding supplement) PROSource Plus  30 mL Oral BID BM   vitamin C  1,000 mg Oral Daily   docusate sodium  100 mg Oral BID   feeding supplement  237 mL Oral BID BM   folic acid  1 mg Oral Daily   Gerhardt's butt cream   Topical BID   lactulose  20 g Oral Daily   multivitamin with minerals  1 tablet Oral Daily   pantoprazole  40 mg Oral BID   simethicone  160 mg Oral TID AC & HS   sodium chloride flush  10 mL Intravenous Q12H   sodium chloride flush  3 mL Intravenous Q12H   zinc sulfate (50mg  elemental zinc)  220 mg Oral Daily     Data Reviewed:   CBG:  Recent Labs  Lab 04/11/23 1601 04/11/23 2024 04/11/23 2319 04/12/23 0320  04/12/23 0808  GLUCAP 87 97 107* 84 79    SpO2: 97 % O2 Flow Rate (L/min): 2 L/min    Vitals:   04/14/23 2031 04/14/23 2357 04/15/23 0408 04/15/23 0815  BP: (!) 140/81 (!) 142/76 (!) 154/89 (!) 160/96  Pulse: 94 85 92 97  Resp: 17 19 18 16   Temp: 98.6 F (37 C) 98.6 F (37 C) 98.5 F (36.9 C) 98 F (36.7 C)  TempSrc: Oral Oral Oral Oral  SpO2: 100% 100% 100% 97%  Weight:   (!) 141.7 kg   Height:          Data Reviewed:  Basic Metabolic Panel: Recent Labs  Lab 04/09/23 0619 04/10/23 0337 04/11/23 0250  NA 133* 135 134*  K 4.0 3.5 3.6  CL 105 106 107  CO2 20* 24 21*  GLUCOSE 84 108* 100*  BUN 11 12 11   CREATININE 1.51* 1.38* 1.12  CALCIUM 6.9* 6.7* 6.5*    CBC: Recent Labs  Lab 04/09/23 0619 04/09/23 1139 04/10/23 0337 04/10/23 0338 04/10/23 1338 04/11/23 0250 04/12/23 0903  WBC 13.1*  --  9.9  --   --  10.5 11.1*  HGB 8.0*   < > 7.5* 7.5* 7.6* 7.2* 8.6*  HCT 25.2*   < > 24.5* 24.1* 24.8* 24.0* 27.6*  MCV 75.9*  --  77.5*  --   --  78.2* 79.5*  PLT 416*  --  417*  --   --  431* 466*   < > = values in this interval not displayed.    LFT Recent Labs  Lab 04/09/23 0619 04/10/23 0337 04/11/23 0250  AST 15 12* 13*  ALT 12 12 12   ALKPHOS 203* 244* 255*  BILITOT 0.8 0.6 0.4  PROT 5.7* 5.4* 5.4*  ALBUMIN <1.5* <1.5* <1.5*     Antibiotics: Anti-infectives (From admission, onward)    Start     Dose/Rate Route Frequency Ordered Stop   04/09/23 1345  doxycycline (VIBRAMYCIN) 100 mg in dextrose 5 % 250 mL IVPB        100 mg 125 mL/hr over 120 Minutes Intravenous Every 12 hours 04/09/23 1252 04/09/23 1746   04/08/23 1300  levofloxacin (LEVAQUIN) IVPB 500 mg  Status:  Discontinued        500 mg 100 mL/hr over 60 Minutes Intravenous On call to O.R. 04/08/23 1229 04/08/23 1648   04/08/23 1300  vancomycin (VANCOREADY) IVPB 1500 mg/300 mL        1,500 mg 100 mL/hr over 180 Minutes Intravenous On call to O.R. 04/08/23 1229 04/08/23 1433   04/08/23  1237  vancomycin HCl (VANCOREADY) 1500 MG/300ML IVPB       Note to Pharmacy: [name] D: cabinet override      04/08/23 1237 04/08/23 1439   04/07/23 0800  piperacillin-tazobactam (ZOSYN) IVPB 3.375 g        3.375 g 12.5 mL/hr over 240 Minutes Intravenous Every 8 hours 04/07/23 0108 04/09/23 2108   04/07/23 0100  doxycycline (VIBRAMYCIN) 100 mg in dextrose 5 % 250 mL IVPB  Status:  Discontinued        100 mg 125 mL/hr over 120 Minutes Intravenous Every 12 hours 04/07/23 0057 04/09/23 1252   04/07/23 0000  ceFEPIme (MAXIPIME) 2 g in sodium chloride 0.9 % 100 mL IVPB  Status:  Discontinued        2 g 200 mL/hr over 30 Minutes Intravenous Every 8 hours 04/06/23 2349 04/07/23 0108   04/06/23 2330  metroNIDAZOLE (FLAGYL) IVPB 500 mg        500 mg 100 mL/hr over 60 Minutes Intravenous  Once 04/06/23 2321 04/07/23 0032   04/06/23 2300  piperacillin-tazobactam (ZOSYN) IVPB 3.375 g  Status:  Discontinued        3.375 g 100 mL/hr over 30 Minutes Intravenous  Once 04/06/23 2257 04/06/23 2320   04/06/23 2300  vancomycin (VANCOREADY) IVPB 2000 mg/400 mL  Status:  Discontinued        2,000 mg 200 mL/hr over 120 Minutes Intravenous  Once 04/06/23 2257 04/06/23 2312        DVT prophylaxis: Lovenox, start 04/15/23  Code Status: Full code  Family Communication: Discussed with patient's wife at bedside   CONSULTS orthopedics   Subjective   Denies pain   Objective    Physical Examination:   General-appears in no acute distress Heart-S1-S2, regular, no murmur auscultated Lungs-clear to auscultation bilaterally, no wheezing or crackles auscultated Abdomen-soft, nontender, no organomegaly Extremities-bilateral BKA Neuro-alert, oriented x3, no focal deficit noted  Status is: Inpatient:      Pressure Injury 04/13/23 Perineum Bilateral Stage 2 -  Partial thickness loss of dermis presenting as a shallow open injury with a red, pink wound bed without slough. perineum/scrotum masd  on admission (Active)  04/13/23 1100  Location: Perineum  Location Orientation: Bilateral  Staging: Stage 2 -  Partial thickness loss of dermis presenting as a shallow open injury with a red, pink wound bed without slough.  Wound Description (Comments): perineum/scrotum masd on admission  Present on Admission:         Alan Tapia S Gwenda Heiner   [facility] If 7PM-7AM, please contact night-coverage at [email or web address], Office  [phone]   04/15/2023, 12:31 PM  LOS: 8 days

## 2023-04-16 DIAGNOSIS — A419 Sepsis, unspecified organism: Secondary | ICD-10-CM | POA: Diagnosis not present

## 2023-04-16 DIAGNOSIS — E11628 Type 2 diabetes mellitus with other skin complications: Secondary | ICD-10-CM | POA: Diagnosis not present

## 2023-04-16 DIAGNOSIS — D649 Anemia, unspecified: Secondary | ICD-10-CM | POA: Diagnosis not present

## 2023-04-16 DIAGNOSIS — M86171 Other acute osteomyelitis, right ankle and foot: Secondary | ICD-10-CM | POA: Diagnosis not present

## 2023-04-16 MED ORDER — SIMETHICONE 80 MG PO CHEW
80.0000 mg | CHEWABLE_TABLET | Freq: Four times a day (QID) | ORAL | Status: DC | PRN
  Administered 2023-04-16 (×2): 80 mg via ORAL
  Filled 2023-04-16 (×2): qty 1

## 2023-04-16 MED ORDER — METOPROLOL TARTRATE 50 MG PO TABS
50.0000 mg | ORAL_TABLET | Freq: Two times a day (BID) | ORAL | Status: DC
  Administered 2023-04-16 – 2023-05-11 (×42): 50 mg via ORAL
  Filled 2023-04-16 (×48): qty 1

## 2023-04-16 NOTE — Progress Notes (Signed)
[facility]  PROGRESS NOTE  Alan Tapia. 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 He was also having nausea, vomiting and diarrhea. Orthopedic surgery was consulted and patient underwent right BKA 11/27 by Dr. Lajoyce Corners.     Assessment/Plan:   Sepsis secondary to nonhealing right foot ulcer, diabetic foot -Previous history of left BKA -Status post right BKA 11/24 -Blood cultures negative -Follow-up with Dr. Lajoyce Corners outpatient -Wound VAC will be removed at the time of discharge as per Dr. Lajoyce Corners   Microcytic anemia -Combination of iron deficiency anemia and anemia of chronic disease -FOBT negative -Received total 4 unit packed red blood cells transfusions this hospitalization -Hemoglobin stable at 8.6  Hypertension -Blood pressure has been elevated -Start metoprolol 50 mg p.o. twice daily, patient was taking metoprolol 100 mg p.o. twice daily at home   Diabetes mellitus -A1c 6.8 -Blood sugar well-controlled   AKI  -Resolved, baseline creatinine 1.1.  No CKD    Medications     (feeding supplement) PROSource Plus  30 mL Oral BID BM   vitamin C  1,000 mg Oral Daily   docusate sodium  100 mg Oral BID   enoxaparin (LOVENOX) injection  40 mg Subcutaneous Q24H   feeding supplement  237 mL Oral BID BM   folic acid  1 mg Oral Daily   Gerhardt'Alan butt cream   Topical BID   lactulose  20 g Oral Daily   multivitamin with minerals  1 tablet Oral Daily   pantoprazole  40 mg Oral BID   sodium chloride flush  10 mL Intravenous Q12H   sodium chloride flush  3 mL Intravenous Q12H    zinc sulfate (50mg  elemental zinc)  220 mg Oral Daily     Data Reviewed:   CBG:  Recent Labs  Lab 04/11/23 1601 04/11/23 2024 04/11/23 2319 04/12/23 0320 04/12/23 0808  GLUCAP 87 97 107* 84 79    SpO2: 97 % O2 Flow Rate (L/min): 2 L/min    Vitals:   04/16/23 0321 04/16/23 0456 04/16/23 0806 04/16/23 0832  BP: (!) 148/83  (!) 170/92 (!) 173/90  Pulse: (!) 102  (!) 106 (!) 104  Resp: 18  17   Temp: 98.1 F (36.7 C)  98.4 F (36.9 C)   TempSrc: Oral  Oral   SpO2: 99%  97%   Weight:  (!) 142.2 kg    Height:          Data Reviewed:  Basic Metabolic Panel: Recent Labs  Lab 04/10/23 0337 04/11/23 0250  NA 135 134*  K 3.5 3.6  CL 106 107  CO2 24 21*  GLUCOSE 108* 100*  BUN 12 11  CREATININE 1.38* 1.12  CALCIUM 6.7* 6.5*    CBC: Recent Labs  Lab 04/10/23 0337 04/10/23 0338 04/10/23 1338 04/11/23 0250 04/12/23 0903  WBC 9.9  --   --  10.5 11.1*  HGB 7.5* 7.5* 7.6* 7.2* 8.6*  HCT 24.5* 24.1* 24.8* 24.0* 27.6*  MCV 77.5*  --   --  78.2* 79.5*  PLT 417*  --   --  431* 466*    LFT Recent Labs  Lab 04/10/23 0337 04/11/23 0250  AST 12* 13*  ALT 12 12  ALKPHOS 244* 255*  BILITOT 0.6 0.4  PROT 5.4* 5.4*  ALBUMIN <1.5* <1.5*     Antibiotics: Anti-infectives (From admission, onward)    Start     Dose/Rate Route Frequency Ordered Stop   04/09/23 1345  doxycycline (VIBRAMYCIN) 100 mg in dextrose 5 % 250 mL IVPB        100 mg 125 mL/hr over 120 Minutes Intravenous Every 12 hours 04/09/23 1252 04/09/23 1746   04/08/23 1300  levofloxacin (LEVAQUIN) IVPB 500 mg  Status:  Discontinued        500 mg 100 mL/hr over 60 Minutes Intravenous On call to O.R. 04/08/23 1229 04/08/23 1648   04/08/23 1300  vancomycin (VANCOREADY) IVPB 1500 mg/300 mL        1,500 mg 100 mL/hr over 180 Minutes Intravenous On call to O.R. 04/08/23 1229 04/08/23 1433   04/08/23 1237  vancomycin HCl (VANCOREADY) 1500 MG/300ML IVPB       Note to Pharmacy: Alan Tapia D:  cabinet override      04/08/23 1237 04/08/23 1439   04/07/23 0800  piperacillin-tazobactam (ZOSYN) IVPB 3.375 g        3.375 g 12.5 mL/hr over 240 Minutes Intravenous Every 8 hours 04/07/23 0108 04/09/23 2108   04/07/23 0100  doxycycline (VIBRAMYCIN) 100 mg in dextrose 5 % 250 mL IVPB  Status:  Discontinued        100 mg 125 mL/hr over 120 Minutes Intravenous Every 12 hours 04/07/23 0057 04/09/23 1252   04/07/23 0000  ceFEPIme (MAXIPIME) 2 g in sodium chloride 0.9 % 100 mL IVPB  Status:  Discontinued        2 g 200 mL/hr over 30 Minutes Intravenous Every 8 hours 04/06/23 2349 04/07/23 0108   04/06/23 2330  metroNIDAZOLE (FLAGYL) IVPB 500 mg        500 mg 100 mL/hr over 60 Minutes Intravenous  Once 04/06/23 2321 04/07/23 0032   04/06/23 2300  piperacillin-tazobactam (ZOSYN) IVPB 3.375 g  Status:  Discontinued        3.375 g 100 mL/hr over 30 Minutes Intravenous  Once 04/06/23 2257 04/06/23 2320   04/06/23 2300  vancomycin (VANCOREADY) IVPB 2000 mg/400 mL  Status:  Discontinued        2,000 mg 200 mL/hr over 120 Minutes Intravenous  Once 04/06/23 2257 04/06/23 2312        DVT prophylaxis: Lovenox, start 04/15/23  Code Status: Full code  Family Communication: Discussed with patient'Alan wife at bedside   CONSULTS orthopedics   Subjective   Denies pain   Objective    Physical Examination:  General-appears in no acute distress Heart-S1-S2, regular, no murmur auscultated Lungs-clear to auscultation bilaterally, no wheezing or crackles auscultated Abdomen-soft, nontender, no organomegaly Extremities-bilateral BKA Neuro-alert, oriented x3, no focal deficit noted   Status is: Inpatient:      Pressure Injury 04/13/23 Perineum Bilateral Stage 2 -  Partial thickness loss of dermis presenting as a shallow open injury with a red, pink wound bed without slough. perineum/scrotum masd on admission (Active)  04/13/23 1100  Location: Perineum  Location Orientation: Bilateral   Staging: Stage 2 -  Partial thickness loss of dermis presenting as a shallow open injury with a red, pink wound bed without slough.  Wound Description (Comments): perineum/scrotum masd on admission  Present on Admission:         Alan Tapia Alan Tapia   [facility] If 7PM-7AM, please contact night-coverage at [email or web address], Office  [phone]   04/16/2023, 9:42 AM  LOS: 9 days

## 2023-04-16 NOTE — TOC Progression Note (Signed)
Transition of Care (TOC) - Progression Note    Patient Details  Name: Hannon Cera. MRN: [id number] Date of Birth: 11-Mar-1976  Transition of Care [facility]) CM/SW Contact  Carley Hammed, LCSW Phone Number: 04/16/2023, 1:36 PM  Clinical Narrative:     CSW provided letter at bedside for apartment manager requesting a handicap accessible apartment. CSW asked pt if he had contact information for the apartment manager so CSW could assist with the process, he will ask his wife. Hale Bogus with [facility] requested an FL2 for SNF level to provide to DSS, CSW provided this. TOC will continue to follow.   Expected Discharge Plan: Skilled Nursing Facility Barriers to Discharge: Insurance Authorization, Continued Medical Work up, SNF Pending bed offer  Expected Discharge Plan and Services In-house Referral: Clinical Social Work     Living arrangements for the past 2 months: Single Family Home Expected Discharge Date: 04/11/23                                     Social Determinants of Health (SDOH) Interventions SDOH Screenings   Food Insecurity: No Food Insecurity (04/07/2023)  Housing: Medium Risk (04/07/2023)  Transportation Needs: No Transportation Needs (04/07/2023)  Recent Concern: Transportation Needs - Unmet Transportation Needs (02/17/2023)   Received from [facility]  Utilities: At Risk (04/07/2023)  Alcohol Screen: Low Risk  (08/20/2022)  Financial Resource Strain: Patient Unable To Answer (08/20/2022)  Tobacco Use: Low Risk  (04/08/2023)    Readmission Risk Interventions    04/07/2023    2:34 PM  Readmission Risk Prevention Plan  Transportation Screening Complete  PCP or Specialist Appt within 3-5 Days Complete  HRI or Home Care Consult Complete  Social Work Consult for Recovery Care Planning/Counseling Complete  Palliative Care Screening Not Applicable  Medication Review Oceanographer) Complete

## 2023-04-16 NOTE — Plan of Care (Signed)
  Problem: Education: Goal: Knowledge of General Education information will improve Description: Including pain rating scale, medication(s)/side effects and non-pharmacologic comfort measures 04/16/2023 0709 by Domingo Dimes, RN Outcome: Progressing 04/16/2023 0709 by Domingo Dimes, RN Outcome: Progressing   Problem: Clinical Measurements: Goal: Ability to maintain clinical measurements within normal limits will improve 04/16/2023 0709 by Domingo Dimes, RN Outcome: Progressing 04/16/2023 0709 by Domingo Dimes, RN Outcome: Progressing

## 2023-04-16 NOTE — Plan of Care (Signed)
  Problem: Pain Management: Goal: General experience of comfort will improve Outcome: Progressing   Problem: Safety: Goal: Ability to remain free from injury will improve Outcome: Progressing   Problem: Skin Integrity: Goal: Risk for impaired skin integrity will decrease Outcome: Progressing

## 2023-04-16 NOTE — Progress Notes (Signed)
Physical Therapy Treatment Patient Details Name: Alan Tapia. MRN: [id number] DOB: 08-24-1975 Today's Date: 04/16/2023   History of Present Illness The pt is a 47 yo male presenting 11/25 with nausea, vomiting, and fever as well as R foot wound. 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    PT Comments  Received pt semi-reclined in bed c/o abdomen pain - RN alerted. Pt performed bed mobility with min A using bed features with increased time/effort due to pain and weakness. Pt able to maintain static sitting balance with BUE support and supervision but politely refused transferring OOB due to nausea, pain, and fatigue. Pt reports at baseline he and his wife were doing slideboard transfers to/from [facility] and bedside commode. Discussed practicing this technique now that pt is bilateral BKA and/or using AP/PA transfers (demonstrated technique to pt and pt receptive to education). Also demonstrated exercises to promote LE strength and ROM for pt to work on. Pt appreciative of all education but ultimately unwilling to get OOB. Current discharge plan remains appropriate. Acute PT to cont to follow.    If plan is discharge home, recommend the following: Two people to help with walking and/or transfers;Assist for transportation;Assistance with cooking/housework;Help with stairs or ramp for entrance   Can travel by private vehicle     No  Equipment Recommendations  Hoyer lift;Wheelchair cushion (measurements PT);Wheelchair (measurements PT)    Recommendations for Other Services       Precautions / Restrictions Precautions Precautions: Fall;Other (comment) Precaution Comments: RLE limb protector Restrictions Weight Bearing Restrictions: Yes RLE Weight Bearing: Non weight bearing Other Position/Activity Restrictions: bilateral BKA      Mobility  Bed Mobility Overal bed mobility: Needs Assistance Bed Mobility: Rolling, Supine to Sit, Sit to Supine Rolling: Min assist   Supine to sit: Min assist, HOB elevated, Used rails Sit to supine: Min assist, Used rails   General bed mobility comments: pt has his own technique for bed mobility but was able to perform with minimal external assist. Pt limited by nausea (almost laying back down). Once sitting EOB, pt politely refused transferring OOB but was receptive to verbally talking through different transfer techniques. Patient Response: Cooperative  Transfers                   General transfer comment: pt politely refused    Ambulation/Gait               General Gait Details: unable   Stairs             Wheelchair Mobility     Tilt Bed Tilt Bed Patient Response: Cooperative  Modified Rankin (Stroke Patients Only)       Balance Overall balance assessment: Needs assistance Sitting-balance support: Bilateral upper extremity supported, Feet unsupported Sitting balance-Leahy Scale: Fair Sitting balance - Comments: able to maintain static sitting balance with supervision with reliance on BUE support for balance                                    Cognition Arousal: Alert Behavior During Therapy: [facility] for tasks assessed/performed Overall Cognitive Status: Within Functional Limits for tasks assessed  General Comments: pt hesitant/slow to move. limited by mild nausea during session.        Exercises      General Comments        Pertinent Vitals/Pain Pain Assessment Pain Score: 9  Pain Location: abdomen Pain Descriptors / Indicators: Discomfort, Grimacing, Moaning Pain Intervention(s): Limited activity within patient's tolerance, Monitored during session, Repositioned, RN gave pain meds during session    Home Living                          Prior Function             PT Goals (current goals can now be found in the care plan section) Acute Rehab PT Goals Patient Stated Goal: get bil prosthetics PT Goal Formulation: With patient Time For Goal Achievement: 04/24/23 Potential to Achieve Goals: Fair    Frequency    Min 1X/week      PT Plan      Co-evaluation              AM-PAC PT "6 Clicks" Mobility   Outcome Measure  Help needed turning from your back to your side while in a flat bed without using bedrails?: A Lot Help needed moving from lying on your back to sitting on the side of a flat bed without using bedrails?: A Lot Help needed moving to and from a bed to a chair (including a wheelchair)?: Total Help needed standing up from a chair using your arms (e.g., wheelchair or bedside chair)?: Total Help needed to walk in hospital room?: Total Help needed climbing 3-5 steps with a railing? : Total 6 Click Score: 8    End of Session   Activity Tolerance: Patient limited by fatigue;No increased pain Patient left: in bed;with call bell/phone within reach;with nursing/sitter in room;with bed alarm set Nurse Communication: Mobility status;Need for lift equipment PT Visit Diagnosis: Other abnormalities of gait and mobility (R26.89);Muscle weakness (generalized) (M62.81);Pain Pain - Right/Left:  (abdomen) Pain - part of body:  (abdomen)     Time: 1610-9604 PT Time Calculation (min) (ACUTE ONLY): 25 min  Charges:    $Therapeutic Activity: 23-37 mins PT General Charges $$ ACUTE PT VISIT: 1 Visit                     Blima Rich PT, DPT Marlana Salvage Zaunegger 04/16/2023, 1:50 PM

## 2023-04-17 ENCOUNTER — Ambulatory Visit (HOSPITAL_BASED_OUTPATIENT_CLINIC_OR_DEPARTMENT_OTHER): Payer: Medicaid Other | Admitting: Family Medicine

## 2023-04-17 DIAGNOSIS — E11628 Type 2 diabetes mellitus with other skin complications: Secondary | ICD-10-CM | POA: Diagnosis not present

## 2023-04-17 DIAGNOSIS — D649 Anemia, unspecified: Secondary | ICD-10-CM | POA: Diagnosis not present

## 2023-04-17 DIAGNOSIS — M86171 Other acute osteomyelitis, right ankle and foot: Secondary | ICD-10-CM | POA: Diagnosis not present

## 2023-04-17 DIAGNOSIS — A419 Sepsis, unspecified organism: Secondary | ICD-10-CM | POA: Diagnosis not present

## 2023-04-17 MED ORDER — SIMETHICONE 80 MG PO CHEW
80.0000 mg | CHEWABLE_TABLET | Freq: Four times a day (QID) | ORAL | Status: AC
  Administered 2023-04-17 – 2023-04-19 (×10): 80 mg via ORAL
  Filled 2023-04-17 (×11): qty 1

## 2023-04-17 MED ORDER — SACCHAROMYCES BOULARDII 250 MG PO CAPS
250.0000 mg | ORAL_CAPSULE | Freq: Two times a day (BID) | ORAL | Status: DC
  Administered 2023-04-17 – 2023-06-10 (×97): 250 mg via ORAL
  Filled 2023-04-17 (×106): qty 1

## 2023-04-17 MED ORDER — PROPOFOL 1000 MG/100ML IV EMUL
INTRAVENOUS | Status: AC
  Filled 2023-04-17: qty 100

## 2023-04-17 MED ORDER — PHENYLEPHRINE HCL-NACL 20-0.9 MG/250ML-% IV SOLN
INTRAVENOUS | Status: AC
  Filled 2023-04-17: qty 500

## 2023-04-17 NOTE — Plan of Care (Signed)
Problem: Fluid Volume: Goal: Hemodynamic stability will improve Outcome: Progressing   Problem: Clinical Measurements: Goal: Diagnostic test results will improve Outcome: Progressing Goal: Signs and symptoms of infection will decrease Outcome: Progressing   Problem: Respiratory: Goal: Ability to maintain adequate ventilation will improve Outcome: Progressing   Problem: Education: Goal: Knowledge of General Education information will improve Description: Including pain rating scale, medication(s)/side effects and non-pharmacologic comfort measures Outcome: Progressing   Problem: Health Behavior/Discharge Planning: Goal: Ability to manage health-related needs will improve Outcome: Progressing   Problem: Clinical Measurements: Goal: Ability to maintain clinical measurements within normal limits will improve Outcome: Progressing Goal: Will remain free from infection Outcome: Progressing Goal: Diagnostic test results will improve Outcome: Progressing Goal: Respiratory complications will improve Outcome: Progressing Goal: Cardiovascular complication will be avoided Outcome: Progressing   Problem: Activity: Goal: Risk for activity intolerance will decrease Outcome: Progressing   Problem: Nutrition: Goal: Adequate nutrition will be maintained Outcome: Progressing   Problem: Coping: Goal: Level of anxiety will decrease Outcome: Progressing   Problem: Elimination: Goal: Will not experience complications related to bowel motility Outcome: Progressing Goal: Will not experience complications related to urinary retention Outcome: Progressing   Problem: Pain Management: Goal: General experience of comfort will improve Outcome: Progressing   Problem: Safety: Goal: Ability to remain free from injury will improve Outcome: Progressing   Problem: Skin Integrity: Goal: Risk for impaired skin integrity will decrease Outcome: Progressing   Problem: Activity: Goal: Ability  to tolerate increased activity will improve Outcome: Progressing   Problem: Clinical Measurements: Goal: Ability to maintain a body temperature in the normal range will improve Outcome: Progressing   Problem: Respiratory: Goal: Ability to maintain adequate ventilation will improve Outcome: Progressing Goal: Ability to maintain a clear airway will improve Outcome: Progressing   Problem: Education: Goal: Knowledge of the prescribed therapeutic regimen will improve Outcome: Progressing Goal: Ability to verbalize activity precautions or restrictions will improve Outcome: Progressing Goal: Understanding of discharge needs will improve Outcome: Progressing   Problem: Activity: Goal: Ability to perform//tolerate increased activity and mobilize with assistive devices will improve Outcome: Progressing   Problem: Clinical Measurements: Goal: Postoperative complications will be avoided or minimized Outcome: Progressing   Problem: Self-Care: Goal: Ability to meet self-care needs will improve Outcome: Progressing   Problem: Self-Concept: Goal: Ability to maintain and perform role responsibilities to the fullest extent possible will improve Outcome: Progressing   Problem: Education: Goal: Knowledge of General Education information will improve Description: Including pain rating scale, medication(s)/side effects and non-pharmacologic comfort measures Outcome: Progressing   Problem: Health Behavior/Discharge Planning: Goal: Ability to manage health-related needs will improve Outcome: Progressing   Problem: Clinical Measurements: Goal: Ability to maintain clinical measurements within normal limits will improve Outcome: Progressing Goal: Will remain free from infection Outcome: Progressing Goal: Diagnostic test results will improve Outcome: Progressing Goal: Respiratory complications will improve Outcome: Progressing Goal: Cardiovascular complication will be avoided Outcome:  Progressing   Problem: Activity: Goal: Risk for activity intolerance will decrease Outcome: Progressing   Problem: Nutrition: Goal: Adequate nutrition will be maintained Outcome: Progressing   Problem: Coping: Goal: Level of anxiety will decrease Outcome: Progressing   Problem: Elimination: Goal: Will not experience complications related to bowel motility Outcome: Progressing Goal: Will not experience complications related to urinary retention Outcome: Progressing   Problem: Pain Management: Goal: General experience of comfort will improve Outcome: Progressing   Problem: Safety: Goal: Ability to remain free from injury will improve Outcome: Progressing   Problem: Skin Integrity: Goal: Risk for  impaired skin integrity will decrease Outcome: Progressing

## 2023-04-17 NOTE — TOC Progression Note (Addendum)
Transition of Care (TOC) - Progression Note    Patient Details  Name: Alan Tapia. MRN: [id number] Date of Birth: 1975-10-20  Transition of Care [facility]) CM/SW Contact  Carley Hammed, LCSW Phone Number: 04/17/2023, 10:51 AM  Clinical Narrative:    CSW was provided contact information by spouse for Mickel Crow Homeless prevention coordinator [phone], c [phone]), Edrick Oh with DSS that assists with paying for housing [phone]) and David's supervisor [place] Abbott with [employer] housing authority [phone]). CSW left VM for each in effort to discuss disposition and assist as needed. TOC will continue to follow to assist with DC needs at this time.  12/6 Hale Bogus from [facility] notified CSW that they will be updating pt's Medicaid in hopes of getting pt some rehab. CSW advised that pt will likely need his disability approved before a facility will take him for rehab, as that has been the criteria for other pt's. Hale Bogus noted understanding. TOC will continue to follow for DC needs.  Expected Discharge Plan: Skilled Nursing Facility Barriers to Discharge: Insurance Authorization, Continued Medical Work up, SNF Pending bed offer  Expected Discharge Plan and Services In-house Referral: Clinical Social Work     Living arrangements for the past 2 months: Single Family Home Expected Discharge Date: 04/11/23                                     Social Determinants of Health (SDOH) Interventions SDOH Screenings   Food Insecurity: No Food Insecurity (04/07/2023)  Housing: Medium Risk (04/07/2023)  Transportation Needs: No Transportation Needs (04/07/2023)  Recent Concern: Transportation Needs - Unmet Transportation Needs (02/17/2023)   Received from [facility]  Utilities: At Risk (04/07/2023)  Alcohol Screen: Low Risk  (08/20/2022)  Financial Resource Strain: Patient Unable To Answer (08/20/2022)  Tobacco Use: Low Risk  (04/08/2023)    Readmission  Risk Interventions    04/07/2023    2:34 PM  Readmission Risk Prevention Plan  Transportation Screening Complete  PCP or Specialist Appt within 3-5 Days Complete  HRI or Home Care Consult Complete  Social Work Consult for Recovery Care Planning/Counseling Complete  Palliative Care Screening Not Applicable  Medication Review Oceanographer) Complete

## 2023-04-17 NOTE — Progress Notes (Signed)
Heart Failure Navigator Progress Note  Assessed for Heart & Vascular TOC clinic readiness.  Patient was admitted for wound of his right post BKA, Patient with history of left BKA, and is wheel chair bound. EF 35-40%, per Notes patient follows with [facility] Dr. Milinda Antis. .   Navigator will sign off at this time.    Rhae Hammock, BSN, Scientist, clinical (histocompatibility and immunogenetics) Only

## 2023-04-17 NOTE — Progress Notes (Signed)
[facility]  PROGRESS NOTE  [name] ZOX:[id number] DOB: [birth year] DOA: 04/06/2023 PCP: Patient, No Pcp Per   Brief HPI:   47 year old male with past medical history significant for left lower extremity BKA, dilated cardiomyopathy, history of combined systolic and diastolic heart failure with EF 35 to 40%, hypertension, insulin-dependent diabetes, peripheral neuropathy, hyperlipidemia who presented to the emergency department on 04/06/2023 with complaints of odor, wound of his foot. He was also having nausea, vomiting and diarrhea. Orthopedic surgery was consulted and patient underwent right BKA 11/27 by Dr. Lajoyce Corners.     Assessment/Plan:   Sepsis secondary to nonhealing right foot ulcer, diabetic foot -Previous history of left BKA -Status post right BKA 11/24 -Blood cultures negative -Follow-up with Dr. Lajoyce Corners outpatient -Wound VAC will be removed at the time of discharge as per Dr. Lajoyce Corners   Microcytic anemia -Combination of iron deficiency anemia and anemia of chronic disease -FOBT negative -Received total 4 unit packed red blood cells transfusions this hospitalization -Hemoglobin stable at 8.6  Hypertension -Blood pressure has been elevated, blood pressure is better after starting metoprolol. -Started metoprolol 50 mg p.o. twice daily, patient was taking metoprolol 100 mg p.o. twice daily at home   Diabetes mellitus -A1c 6.8 -Blood sugar well-controlled   AKI  -Resolved, baseline creatinine 1.1.   -No CKD    Medications     (feeding supplement) PROSource Plus  30 mL Oral BID BM   vitamin C  1,000 mg Oral Daily   docusate sodium  100 mg Oral BID   enoxaparin (LOVENOX) injection  40 mg Subcutaneous Q24H   feeding supplement  237 mL Oral BID BM   folic acid  1 mg Oral Daily   Gerhardt's butt cream   Topical BID   lactulose  20 g Oral Daily   metoprolol tartrate  50 mg Oral BID   multivitamin with minerals  1 tablet Oral Daily   pantoprazole  40 mg Oral BID    sodium chloride flush  10 mL Intravenous Q12H   sodium chloride flush  3 mL Intravenous Q12H   zinc sulfate (50mg  elemental zinc)  220 mg Oral Daily     Data Reviewed:   CBG:  Recent Labs  Lab 04/11/23 1601 04/11/23 2024 04/11/23 2319 04/12/23 0320 04/12/23 0808  GLUCAP 87 97 107* 84 79    SpO2: 100 % O2 Flow Rate (L/min): 2 L/min    Vitals:   04/16/23 2142 04/17/23 0342 04/17/23 0500 04/17/23 0719  BP: (!) 145/82 (!) 143/85  (!) 149/86  Pulse: 98 84  92  Resp:  20  15  Temp:  97.9 F (36.6 C)  (!) 97.4 F (36.3 C)  TempSrc:  Oral  Oral  SpO2:  100%  100%  Weight:   (!) 142.7 kg   Height:          Data Reviewed:  Basic Metabolic Panel: Recent Labs  Lab 04/11/23 0250  NA 134*  K 3.6  CL 107  CO2 21*  GLUCOSE 100*  BUN 11  CREATININE 1.12  CALCIUM 6.5*    CBC: Recent Labs  Lab 04/10/23 1338 04/11/23 0250 04/12/23 0903  WBC  --  10.5 11.1*  HGB 7.6* 7.2* 8.6*  HCT 24.8* 24.0* 27.6*  MCV  --  78.2* 79.5*  PLT  --  431* 466*    LFT Recent Labs  Lab 04/11/23 0250  AST 13*  ALT 12  ALKPHOS 255*  BILITOT 0.4  PROT 5.4*  ALBUMIN <1.5*  Antibiotics: Anti-infectives (From admission, onward)    Start     Dose/Rate Route Frequency Ordered Stop   04/09/23 1345  doxycycline (VIBRAMYCIN) 100 mg in dextrose 5 % 250 mL IVPB        100 mg 125 mL/hr over 120 Minutes Intravenous Every 12 hours 04/09/23 1252 04/09/23 1746   04/08/23 1300  levofloxacin (LEVAQUIN) IVPB 500 mg  Status:  Discontinued        500 mg 100 mL/hr over 60 Minutes Intravenous On call to O.R. 04/08/23 1229 04/08/23 1648   04/08/23 1300  vancomycin (VANCOREADY) IVPB 1500 mg/300 mL        1,500 mg 100 mL/hr over 180 Minutes Intravenous On call to O.R. 04/08/23 1229 04/08/23 1433   04/08/23 1237  vancomycin HCl (VANCOREADY) 1500 MG/300ML IVPB       Note to Pharmacy: [name] D: cabinet override      04/08/23 1237 04/08/23 1439   04/07/23 0800   piperacillin-tazobactam (ZOSYN) IVPB 3.375 g        3.375 g 12.5 mL/hr over 240 Minutes Intravenous Every 8 hours 04/07/23 0108 04/09/23 2108   04/07/23 0100  doxycycline (VIBRAMYCIN) 100 mg in dextrose 5 % 250 mL IVPB  Status:  Discontinued        100 mg 125 mL/hr over 120 Minutes Intravenous Every 12 hours 04/07/23 0057 04/09/23 1252   04/07/23 0000  ceFEPIme (MAXIPIME) 2 g in sodium chloride 0.9 % 100 mL IVPB  Status:  Discontinued        2 g 200 mL/hr over 30 Minutes Intravenous Every 8 hours 04/06/23 2349 04/07/23 0108   04/06/23 2330  metroNIDAZOLE (FLAGYL) IVPB 500 mg        500 mg 100 mL/hr over 60 Minutes Intravenous  Once 04/06/23 2321 04/07/23 0032   04/06/23 2300  piperacillin-tazobactam (ZOSYN) IVPB 3.375 g  Status:  Discontinued        3.375 g 100 mL/hr over 30 Minutes Intravenous  Once 04/06/23 2257 04/06/23 2320   04/06/23 2300  vancomycin (VANCOREADY) IVPB 2000 mg/400 mL  Status:  Discontinued        2,000 mg 200 mL/hr over 120 Minutes Intravenous  Once 04/06/23 2257 04/06/23 2312        DVT prophylaxis: Lovenox, start 04/15/23  Code Status: Full code  Family Communication: Discussed with patient's wife at bedside   CONSULTS orthopedics   Subjective   Complains of bloating   Objective    Physical Examination:   General-appears in no acute distress Heart-S1-S2, regular, no murmur auscultated Lungs-clear to auscultation bilaterally, no wheezing or crackles auscultated Abdomen-soft, nontender, no organomegaly Extremities-bilateral BKA Neuro-alert, oriented x3, no focal deficit noted  Status is: Inpatient:      Pressure Injury 04/13/23 Perineum Bilateral Stage 2 -  Partial thickness loss of dermis presenting as a shallow open injury with a red, pink wound bed without slough. perineum/scrotum masd on admission (Active)  04/13/23 1100  Location: Perineum  Location Orientation: Bilateral  Staging: Stage 2 -  Partial thickness loss of dermis  presenting as a shallow open injury with a red, pink wound bed without slough.  Wound Description (Comments): perineum/scrotum masd on admission  Present on Admission:         Alfonso Carden S Jamine Wingate   [facility] If 7PM-7AM, please contact night-coverage at [email or web address], Office  [phone]   04/17/2023, 10:04 AM  LOS: 10 days

## 2023-04-18 ENCOUNTER — Other Ambulatory Visit: Payer: Medicaid Other | Admitting: Radiology

## 2023-04-18 ENCOUNTER — Inpatient Hospital Stay (HOSPITAL_COMMUNITY): Payer: Medicaid Other

## 2023-04-18 DIAGNOSIS — A419 Sepsis, unspecified organism: Secondary | ICD-10-CM | POA: Diagnosis not present

## 2023-04-18 DIAGNOSIS — M86171 Other acute osteomyelitis, right ankle and foot: Secondary | ICD-10-CM | POA: Diagnosis not present

## 2023-04-18 DIAGNOSIS — D649 Anemia, unspecified: Secondary | ICD-10-CM | POA: Diagnosis not present

## 2023-04-18 DIAGNOSIS — E11628 Type 2 diabetes mellitus with other skin complications: Secondary | ICD-10-CM | POA: Diagnosis not present

## 2023-04-18 LAB — COMPREHENSIVE METABOLIC PANEL
ALT: 12 U/L (ref 0–44)
AST: 15 U/L (ref 15–41)
Albumin: <1.5 g/dL — ABNORMAL LOW (ref 3.5–5.0)
Alkaline Phosphatase: 202 U/L — ABNORMAL HIGH (ref 38–126)
Anion gap: 4 — ABNORMAL LOW (ref 5–15)
BUN: 13 mg/dL (ref 6–20)
CO2: 26 mmol/L (ref 22–32)
Calcium: 7.6 mg/dL — ABNORMAL LOW (ref 8.9–10.3)
Chloride: 107 mmol/L (ref 98–111)
Creatinine, Ser: 1.03 mg/dL (ref 0.61–1.24)
GFR, Estimated: >60 mL/min (ref >60)
Glucose, Bld: 93 mg/dL (ref 70–99)
Potassium: 4 mmol/L (ref 3.5–5.1)
Sodium: 137 mmol/L (ref 135–145)
Total Bilirubin: 0.5 mg/dL (ref <1.2)
Total Protein: 6.3 g/dL — ABNORMAL LOW (ref 6.5–8.1)

## 2023-04-18 LAB — CBC
HCT: 27.7 % — ABNORMAL LOW (ref 39.0–52.0)
Hemoglobin: 8.4 g/dL — ABNORMAL LOW (ref 13.0–17.0)
MCH: 24.9 pg — ABNORMAL LOW (ref 26.0–34.0)
MCHC: 30.3 g/dL (ref 30.0–36.0)
MCV: 82.2 fL (ref 80.0–100.0)
Platelets: 504 10*3/uL — ABNORMAL HIGH (ref 150–400)
RBC: 3.37 MIL/uL — ABNORMAL LOW (ref 4.22–5.81)
RDW: 22.8 % — ABNORMAL HIGH (ref 11.5–15.5)
WBC: 11.2 10*3/uL — ABNORMAL HIGH (ref 4.0–10.5)
nRBC: 0 % (ref 0.0–0.2)

## 2023-04-18 LAB — HEPATITIS PANEL, ACUTE
HCV Ab: NONREACTIVE
Hep A IgM: NONREACTIVE
Hep B C IgM: NONREACTIVE
Hepatitis B Surface Ag: NONREACTIVE

## 2023-04-18 LAB — GLUCOSE, CAPILLARY: Glucose-Capillary: 90 mg/dL (ref 70–99)

## 2023-04-18 MED ORDER — METOCLOPRAMIDE HCL 5 MG/ML IJ SOLN
5.0000 mg | Freq: Four times a day (QID) | INTRAMUSCULAR | Status: DC
  Administered 2023-04-18 – 2023-04-28 (×37): 5 mg via INTRAVENOUS
  Filled 2023-04-18 (×39): qty 2

## 2023-04-18 NOTE — Plan of Care (Signed)
Problem: Fluid Volume: Goal: Hemodynamic stability will improve Outcome: Progressing   Problem: Clinical Measurements: Goal: Diagnostic test results will improve Outcome: Progressing Goal: Signs and symptoms of infection will decrease Outcome: Progressing   Problem: Respiratory: Goal: Ability to maintain adequate ventilation will improve Outcome: Progressing   Problem: Education: Goal: Knowledge of General Education information will improve Description: Including pain rating scale, medication(s)/side effects and non-pharmacologic comfort measures Outcome: Progressing   Problem: Health Behavior/Discharge Planning: Goal: Ability to manage health-related needs will improve Outcome: Progressing   Problem: Clinical Measurements: Goal: Ability to maintain clinical measurements within normal limits will improve Outcome: Progressing Goal: Will remain free from infection Outcome: Progressing Goal: Diagnostic test results will improve Outcome: Progressing Goal: Respiratory complications will improve Outcome: Progressing Goal: Cardiovascular complication will be avoided Outcome: Progressing   Problem: Activity: Goal: Risk for activity intolerance will decrease Outcome: Progressing   Problem: Nutrition: Goal: Adequate nutrition will be maintained Outcome: Progressing   Problem: Coping: Goal: Level of anxiety will decrease Outcome: Progressing   Problem: Elimination: Goal: Will not experience complications related to bowel motility Outcome: Progressing Goal: Will not experience complications related to urinary retention Outcome: Progressing   Problem: Pain Management: Goal: General experience of comfort will improve Outcome: Progressing   Problem: Safety: Goal: Ability to remain free from injury will improve Outcome: Progressing   Problem: Skin Integrity: Goal: Risk for impaired skin integrity will decrease Outcome: Progressing   Problem: Activity: Goal: Ability  to tolerate increased activity will improve Outcome: Progressing   Problem: Clinical Measurements: Goal: Ability to maintain a body temperature in the normal range will improve Outcome: Progressing   Problem: Respiratory: Goal: Ability to maintain adequate ventilation will improve Outcome: Progressing Goal: Ability to maintain a clear airway will improve Outcome: Progressing   Problem: Education: Goal: Knowledge of the prescribed therapeutic regimen will improve Outcome: Progressing Goal: Ability to verbalize activity precautions or restrictions will improve Outcome: Progressing Goal: Understanding of discharge needs will improve Outcome: Progressing   Problem: Activity: Goal: Ability to perform//tolerate increased activity and mobilize with assistive devices will improve Outcome: Progressing   Problem: Clinical Measurements: Goal: Postoperative complications will be avoided or minimized Outcome: Progressing   Problem: Self-Care: Goal: Ability to meet self-care needs will improve Outcome: Progressing   Problem: Self-Concept: Goal: Ability to maintain and perform role responsibilities to the fullest extent possible will improve Outcome: Progressing   Problem: Education: Goal: Knowledge of General Education information will improve Description: Including pain rating scale, medication(s)/side effects and non-pharmacologic comfort measures Outcome: Progressing   Problem: Health Behavior/Discharge Planning: Goal: Ability to manage health-related needs will improve Outcome: Progressing   Problem: Clinical Measurements: Goal: Ability to maintain clinical measurements within normal limits will improve Outcome: Progressing Goal: Will remain free from infection Outcome: Progressing Goal: Diagnostic test results will improve Outcome: Progressing Goal: Respiratory complications will improve Outcome: Progressing Goal: Cardiovascular complication will be avoided Outcome:  Progressing   Problem: Activity: Goal: Risk for activity intolerance will decrease Outcome: Progressing   Problem: Nutrition: Goal: Adequate nutrition will be maintained Outcome: Progressing   Problem: Coping: Goal: Level of anxiety will decrease Outcome: Progressing   Problem: Elimination: Goal: Will not experience complications related to bowel motility Outcome: Progressing Goal: Will not experience complications related to urinary retention Outcome: Progressing   Problem: Pain Management: Goal: General experience of comfort will improve Outcome: Progressing   Problem: Safety: Goal: Ability to remain free from injury will improve Outcome: Progressing   Problem: Skin Integrity: Goal: Risk for  impaired skin integrity will decrease Outcome: Progressing

## 2023-04-18 NOTE — Plan of Care (Signed)
  Problem: Fluid Volume: Goal: Hemodynamic stability will improve Outcome: Progressing   Problem: Clinical Measurements: Goal: Diagnostic test results will improve Outcome: Progressing Goal: Signs and symptoms of infection will decrease Outcome: Progressing   Problem: Respiratory: Goal: Ability to maintain adequate ventilation will improve Outcome: Progressing   Problem: Education: Goal: Knowledge of General Education information will improve Description: Including pain rating scale, medication(s)/side effects and non-pharmacologic comfort measures Outcome: Progressing   Problem: Clinical Measurements: Goal: Ability to maintain clinical measurements within normal limits will improve Outcome: Progressing Goal: Will remain free from infection Outcome: Progressing Goal: Diagnostic test results will improve Outcome: Progressing Goal: Respiratory complications will improve Outcome: Progressing Goal: Cardiovascular complication will be avoided Outcome: Progressing   Problem: Health Behavior/Discharge Planning: Goal: Ability to manage health-related needs will improve Outcome: Progressing   Problem: Activity: Goal: Risk for activity intolerance will decrease Outcome: Progressing   Problem: Nutrition: Goal: Adequate nutrition will be maintained Outcome: Progressing   Problem: Coping: Goal: Level of anxiety will decrease Outcome: Progressing   Problem: Elimination: Goal: Will not experience complications related to bowel motility Outcome: Progressing Goal: Will not experience complications related to urinary retention Outcome: Progressing   Problem: Pain Management: Goal: General experience of comfort will improve Outcome: Progressing   Problem: Safety: Goal: Ability to remain free from injury will improve Outcome: Progressing   Problem: Education: Goal: Knowledge of General Education information will improve Description: Including pain rating scale,  medication(s)/side effects and non-pharmacologic comfort measures Outcome: Progressing   Problem: Health Behavior/Discharge Planning: Goal: Ability to manage health-related needs will improve Outcome: Progressing   Problem: Clinical Measurements: Goal: Ability to maintain clinical measurements within normal limits will improve Outcome: Progressing Goal: Will remain free from infection Outcome: Progressing Goal: Diagnostic test results will improve Outcome: Progressing Goal: Respiratory complications will improve Outcome: Progressing Goal: Cardiovascular complication will be avoided Outcome: Progressing

## 2023-04-18 NOTE — Progress Notes (Signed)
[facility]  PROGRESS NOTE  Alan Tapia. 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 He was also having nausea, vomiting and diarrhea. Orthopedic surgery was consulted and Alan Tapia underwent right BKA 11/27 by Dr. Lajoyce Corners.     Assessment/Plan:   Sepsis secondary to nonhealing right foot ulcer, diabetic foot -Previous history of left BKA -Status post right BKA 11/24 -Blood cultures negative -Follow-up with Dr. Lajoyce Corners outpatient -Wound VAC will be removed at the time of discharge as per Dr. Lajoyce Corners   Microcytic anemia -Combination of iron deficiency anemia and anemia of chronic disease -FOBT negative -Received total 4 unit packed red blood cells transfusions this hospitalization -Hemoglobin stable at 8.6  Hypertension -Blood pressure has been elevated, blood pressure is better after starting metoprolol. -Started metoprolol 50 mg p.o. twice daily, Alan Tapia was taking metoprolol 100 mg p.o. twice daily at home   Diabetes mellitus -A1c 6.8 -Blood sugar well-controlled   AKI  -Resolved, baseline creatinine 1.1.   -No CKD  Vomiting -He has elevated alk phos -Abdominal ultrasound obtained today shows liver cirrhosis with sludge in the gallbladder -Will obtain hepatitis panel -Vomiting likely due to gastroparesis -Start Reglan 5 mg IV every 6 hours scheduled -Started back on regular diet   Medications     (feeding supplement) PROSource Plus  30 mL Oral BID BM   vitamin C  1,000 mg Oral Daily   docusate sodium  100 mg Oral BID   enoxaparin (LOVENOX) injection  40 mg  Subcutaneous Q24H   feeding supplement  237 mL Oral BID BM   folic acid  1 mg Oral Daily   Gerhardt's butt cream   Topical BID   lactulose  20 g Oral Daily   metoprolol tartrate  50 mg Oral BID   multivitamin with minerals  1 tablet Oral Daily   pantoprazole  40 mg Oral BID   saccharomyces boulardii  250 mg Oral BID   simethicone  80 mg Oral QID   sodium chloride flush  10 mL Intravenous Q12H   sodium chloride flush  3 mL Intravenous Q12H   zinc sulfate (50mg  elemental zinc)  220 mg Oral Daily     Data Reviewed:   CBG:  Recent Labs  Lab 04/11/23 1601 04/11/23 2024 04/11/23 2319 04/12/23 0320 04/12/23 0808  GLUCAP 87 97 107* 84 79    SpO2: 95 % O2 Flow Rate (L/min): 2 L/min    Vitals:   04/17/23 2008 04/18/23 0500 04/18/23 0550 04/18/23 0802  BP: 137/80  (!) 146/85 (!) 140/82  Pulse: 91  90 91  Resp: 18  18 20   Temp: 97.8 F (36.6 C)  98 F (36.7 C) 97.8 F (36.6 C)  TempSrc: Oral  Oral Oral  SpO2: 97%  97% 95%  Weight:  (!) 143.5 kg    Height:          Data Reviewed:  Basic Metabolic Panel: No results for input(s): "NA", "K", "CL", "CO2", "GLUCOSE", "BUN", "CREATININE", "CALCIUM", "MG", "PHOS" in the last 168 hours.   CBC: Recent Labs  Lab 04/12/23 0903  WBC 11.1*  HGB 8.6*  HCT 27.6*  MCV 79.5*  PLT 466*  LFT No results for input(s): "AST", "ALT", "ALKPHOS", "BILITOT", "PROT", "ALBUMIN" in the last 168 hours.    Antibiotics: Anti-infectives (From admission, onward)    Start     Dose/Rate Route Frequency Ordered Stop   04/09/23 1345  doxycycline (VIBRAMYCIN) 100 mg in dextrose 5 % 250 mL IVPB        100 mg 125 mL/hr over 120 Minutes Intravenous Every 12 hours 04/09/23 1252 04/09/23 1746   04/08/23 1300  levofloxacin (LEVAQUIN) IVPB 500 mg  Status:  Discontinued        500 mg 100 mL/hr over 60 Minutes Intravenous On call to O.R. 04/08/23 1229 04/08/23 1648   04/08/23 1300  vancomycin (VANCOREADY) IVPB 1500 mg/300 mL        1,500  mg 100 mL/hr over 180 Minutes Intravenous On call to O.R. 04/08/23 1229 04/08/23 1433   04/08/23 1237  vancomycin HCl (VANCOREADY) 1500 MG/300ML IVPB       Note to Pharmacy: [name] D: cabinet override      04/08/23 1237 04/08/23 1439   04/07/23 0800  piperacillin-tazobactam (ZOSYN) IVPB 3.375 g        3.375 g 12.5 mL/hr over 240 Minutes Intravenous Every 8 hours 04/07/23 0108 04/09/23 2108   04/07/23 0100  doxycycline (VIBRAMYCIN) 100 mg in dextrose 5 % 250 mL IVPB  Status:  Discontinued        100 mg 125 mL/hr over 120 Minutes Intravenous Every 12 hours 04/07/23 0057 04/09/23 1252   04/07/23 0000  ceFEPIme (MAXIPIME) 2 g in sodium chloride 0.9 % 100 mL IVPB  Status:  Discontinued        2 g 200 mL/hr over 30 Minutes Intravenous Every 8 hours 04/06/23 2349 04/07/23 0108   04/06/23 2330  metroNIDAZOLE (FLAGYL) IVPB 500 mg        500 mg 100 mL/hr over 60 Minutes Intravenous  Once 04/06/23 2321 04/07/23 0032   04/06/23 2300  piperacillin-tazobactam (ZOSYN) IVPB 3.375 g  Status:  Discontinued        3.375 g 100 mL/hr over 30 Minutes Intravenous  Once 04/06/23 2257 04/06/23 2320   04/06/23 2300  vancomycin (VANCOREADY) IVPB 2000 mg/400 mL  Status:  Discontinued        2,000 mg 200 mL/hr over 120 Minutes Intravenous  Once 04/06/23 2257 04/06/23 2312        DVT prophylaxis: Lovenox, start 04/15/23  Code Status: Full code  Family Communication: Discussed with Alan Tapia's wife at bedside   CONSULTS orthopedics   Subjective   Had 1 episode of vomiting this morning.  Lab work showed normal liver function.   Objective    Physical Examination:   General-appears in no acute distress Heart-S1-S2, regular, no murmur auscultated Lungs-clear to auscultation bilaterally, no wheezing or crackles auscultated Abdomen-soft, nontender, no organomegaly Extremities-bilateral BKA's Neuro-alert, oriented x3, no focal deficit noted  Status is: Inpatient:      Pressure Injury  04/13/23 Perineum Bilateral Stage 2 -  Partial thickness loss of dermis presenting as a shallow open injury with a red, pink wound bed without slough. perineum/scrotum masd on admission (Active)  04/13/23 1100  Location: Perineum  Location Orientation: Bilateral  Staging: Stage 2 -  Partial thickness loss of dermis presenting as a shallow open injury with a red, pink wound bed without slough.  Wound Description (Comments): perineum/scrotum masd on admission  Present on Admission:         Khambrel Amsden S Nechemia Chiappetta   [facility] If 7PM-7AM, please contact  night-coverage at [email or web address], Office  [phone]   04/18/2023, 10:07 AM  LOS: 11 days

## 2023-04-19 DIAGNOSIS — A419 Sepsis, unspecified organism: Secondary | ICD-10-CM | POA: Diagnosis not present

## 2023-04-19 DIAGNOSIS — M86171 Other acute osteomyelitis, right ankle and foot: Secondary | ICD-10-CM | POA: Diagnosis not present

## 2023-04-19 DIAGNOSIS — D649 Anemia, unspecified: Secondary | ICD-10-CM | POA: Diagnosis not present

## 2023-04-19 DIAGNOSIS — E11628 Type 2 diabetes mellitus with other skin complications: Secondary | ICD-10-CM | POA: Diagnosis not present

## 2023-04-19 NOTE — Progress Notes (Signed)
[facility]  PROGRESS NOTE  Alan Tapia. BJY:[id number] DOB: Jan 28, 1976 DOA: 04/06/2023 PCP: Patient, No Pcp Per   Brief HPI:   47 year old male with past medical history significant for left lower extremity BKA, dilated cardiomyopathy, history of combined systolic and diastolic heart failure with EF 35 to 40%, hypertension, insulin-dependent diabetes, peripheral neuropathy, hyperlipidemia who presented to the emergency department on 04/06/2023 with complaints of odor, wound of his foot. He was also having nausea, vomiting and diarrhea. Orthopedic surgery was consulted and patient underwent right BKA 11/27 by Dr. Lajoyce Corners.     Assessment/Plan:   Sepsis secondary to nonhealing right foot ulcer, diabetic foot -Previous history of left BKA -Status post right BKA 11/24 -Blood cultures negative -Follow-up with Dr. Lajoyce Corners outpatient -Wound VAC will be removed at the time of discharge as per Dr. Lajoyce Corners   Microcytic anemia -Combination of iron deficiency anemia and anemia of chronic disease -FOBT negative -Received total 4 unit packed red blood cells transfusions this hospitalization -Hemoglobin stable at 8.6  Hypertension -Blood pressure has been elevated, blood pressure is better after starting metoprolol. -Started metoprolol 50 mg p.o. twice daily, patient was taking metoprolol 100 mg p.o. twice daily at home   Diabetes mellitus -A1c 6.8 -Blood sugar well-controlled   AKI  -Resolved, baseline creatinine 1.1.   -No CKD  Vomiting -He has elevated alk phos -Abdominal ultrasound obtained today shows liver cirrhosis with sludge in the gallbladder -Acute hepatitis panel negative -Vomiting likely due to gastroparesis; improved after starting Reglan 5 mg IV every 6 hours -Started back on regular diet   Medications     (feeding supplement) PROSource Plus  30 mL Oral BID BM   vitamin C  1,000 mg Oral Daily   docusate sodium  100 mg Oral BID   enoxaparin (LOVENOX) injection  40  mg Subcutaneous Q24H   feeding supplement  237 mL Oral BID BM   folic acid  1 mg Oral Daily   Gerhardt's butt cream   Topical BID   lactulose  20 g Oral Daily   metoCLOPramide (REGLAN) injection  5 mg Intravenous Q6H   metoprolol tartrate  50 mg Oral BID   multivitamin with minerals  1 tablet Oral Daily   pantoprazole  40 mg Oral BID   saccharomyces boulardii  250 mg Oral BID   simethicone  80 mg Oral QID   sodium chloride flush  10 mL Intravenous Q12H   sodium chloride flush  3 mL Intravenous Q12H   zinc sulfate (50mg  elemental zinc)  220 mg Oral Daily     Data Reviewed:   CBG:  Recent Labs  Lab 04/18/23 1122  GLUCAP 90    SpO2: 96 % O2 Flow Rate (L/min): 2 L/min    Vitals:   04/18/23 2234 04/19/23 0603 04/19/23 0817 04/19/23 0935  BP: 137/75 (!) 141/80 (!) 146/89 (!) 146/89  Pulse: 98 81 87 87  Resp: 17  18   Temp: 98.7 F (37.1 C) 98.1 F (36.7 C) (!) 97.3 F (36.3 C)   TempSrc: Oral Oral Oral   SpO2: 99% 99% 96%   Weight:      Height:          Data Reviewed:  Basic Metabolic Panel: Recent Labs  Lab 04/18/23 1158  NA 137  K 4.0  CL 107  CO2 26  GLUCOSE 93  BUN 13  CREATININE 1.03  CALCIUM 7.6*     CBC: Recent Labs  Lab 04/18/23 1158  WBC 11.2*  HGB 8.4*  HCT 27.7*  MCV 82.2  PLT 504*    LFT Recent Labs  Lab 04/18/23 1158  AST 15  ALT 12  ALKPHOS 202*  BILITOT 0.5  PROT 6.3*  ALBUMIN <1.5*      Antibiotics: Anti-infectives (From admission, onward)    Start     Dose/Rate Route Frequency Ordered Stop   04/09/23 1345  doxycycline (VIBRAMYCIN) 100 mg in dextrose 5 % 250 mL IVPB        100 mg 125 mL/hr over 120 Minutes Intravenous Every 12 hours 04/09/23 1252 04/09/23 1746   04/08/23 1300  levofloxacin (LEVAQUIN) IVPB 500 mg  Status:  Discontinued        500 mg 100 mL/hr over 60 Minutes Intravenous On call to O.R. 04/08/23 1229 04/08/23 1648   04/08/23 1300  vancomycin (VANCOREADY) IVPB 1500 mg/300 mL        1,500 mg 100  mL/hr over 180 Minutes Intravenous On call to O.R. 04/08/23 1229 04/08/23 1433   04/08/23 1237  vancomycin HCl (VANCOREADY) 1500 MG/300ML IVPB       Note to Pharmacy: [name] D: cabinet override      04/08/23 1237 04/08/23 1439   04/07/23 0800  piperacillin-tazobactam (ZOSYN) IVPB 3.375 g        3.375 g 12.5 mL/hr over 240 Minutes Intravenous Every 8 hours 04/07/23 0108 04/09/23 2108   04/07/23 0100  doxycycline (VIBRAMYCIN) 100 mg in dextrose 5 % 250 mL IVPB  Status:  Discontinued        100 mg 125 mL/hr over 120 Minutes Intravenous Every 12 hours 04/07/23 0057 04/09/23 1252   04/07/23 0000  ceFEPIme (MAXIPIME) 2 g in sodium chloride 0.9 % 100 mL IVPB  Status:  Discontinued        2 g 200 mL/hr over 30 Minutes Intravenous Every 8 hours 04/06/23 2349 04/07/23 0108   04/06/23 2330  metroNIDAZOLE (FLAGYL) IVPB 500 mg        500 mg 100 mL/hr over 60 Minutes Intravenous  Once 04/06/23 2321 04/07/23 0032   04/06/23 2300  piperacillin-tazobactam (ZOSYN) IVPB 3.375 g  Status:  Discontinued        3.375 g 100 mL/hr over 30 Minutes Intravenous  Once 04/06/23 2257 04/06/23 2320   04/06/23 2300  vancomycin (VANCOREADY) IVPB 2000 mg/400 mL  Status:  Discontinued        2,000 mg 200 mL/hr over 120 Minutes Intravenous  Once 04/06/23 2257 04/06/23 2312        DVT prophylaxis: Lovenox, start 04/15/23  Code Status: Full code  Family Communication: Discussed with patient's wife at bedside   CONSULTS orthopedics   Subjective   Denies nausea and vomiting.  Improved after starting Reglan.   Objective    Physical Examination:  General-appears in no acute distress Heart-S1-S2, regular, no murmur auscultated Lungs-clear to auscultation bilaterally, no wheezing or crackles auscultated Abdomen-soft, nontender, no organomegaly Extremities-bilateral BKA Neuro-alert, oriented x3, no focal deficit noted   Status is: Inpatient:      Pressure Injury 04/13/23 Perineum Bilateral Stage  2 -  Partial thickness loss of dermis presenting as a shallow open injury with a red, pink wound bed without slough. perineum/scrotum masd on admission (Active)  04/13/23 1100  Location: Perineum  Location Orientation: Bilateral  Staging: Stage 2 -  Partial thickness loss of dermis presenting as a shallow open injury with a red, pink wound bed without slough.  Wound Description (Comments): perineum/scrotum masd on admission  Present on  Admission:         Meredeth Ide   [facility] If 7PM-7AM, please contact night-coverage at [email or web address], Office  [phone]   04/19/2023, 11:34 AM  LOS: 12 days

## 2023-04-19 NOTE — Plan of Care (Signed)
Problem: Fluid Volume: Goal: Hemodynamic stability will improve Outcome: Progressing   Problem: Clinical Measurements: Goal: Diagnostic test results will improve Outcome: Progressing Goal: Signs and symptoms of infection will decrease Outcome: Progressing   Problem: Respiratory: Goal: Ability to maintain adequate ventilation will improve Outcome: Progressing   Problem: Education: Goal: Knowledge of General Education information will improve Description: Including pain rating scale, medication(s)/side effects and non-pharmacologic comfort measures Outcome: Progressing   Problem: Health Behavior/Discharge Planning: Goal: Ability to manage health-related needs will improve Outcome: Progressing   Problem: Clinical Measurements: Goal: Ability to maintain clinical measurements within normal limits will improve Outcome: Progressing Goal: Will remain free from infection Outcome: Progressing Goal: Diagnostic test results will improve Outcome: Progressing Goal: Respiratory complications will improve Outcome: Progressing Goal: Cardiovascular complication will be avoided Outcome: Progressing   Problem: Activity: Goal: Risk for activity intolerance will decrease Outcome: Progressing   Problem: Nutrition: Goal: Adequate nutrition will be maintained Outcome: Progressing   Problem: Coping: Goal: Level of anxiety will decrease Outcome: Progressing   Problem: Elimination: Goal: Will not experience complications related to bowel motility Outcome: Progressing Goal: Will not experience complications related to urinary retention Outcome: Progressing   Problem: Pain Management: Goal: General experience of comfort will improve Outcome: Progressing   Problem: Safety: Goal: Ability to remain free from injury will improve Outcome: Progressing   Problem: Skin Integrity: Goal: Risk for impaired skin integrity will decrease Outcome: Progressing   Problem: Activity: Goal: Ability  to tolerate increased activity will improve Outcome: Progressing   Problem: Clinical Measurements: Goal: Ability to maintain a body temperature in the normal range will improve Outcome: Progressing   Problem: Respiratory: Goal: Ability to maintain adequate ventilation will improve Outcome: Progressing Goal: Ability to maintain a clear airway will improve Outcome: Progressing   Problem: Education: Goal: Knowledge of the prescribed therapeutic regimen will improve Outcome: Progressing Goal: Ability to verbalize activity precautions or restrictions will improve Outcome: Progressing Goal: Understanding of discharge needs will improve Outcome: Progressing   Problem: Activity: Goal: Ability to perform//tolerate increased activity and mobilize with assistive devices will improve Outcome: Progressing   Problem: Clinical Measurements: Goal: Postoperative complications will be avoided or minimized Outcome: Progressing   Problem: Self-Care: Goal: Ability to meet self-care needs will improve Outcome: Progressing   Problem: Self-Concept: Goal: Ability to maintain and perform role responsibilities to the fullest extent possible will improve Outcome: Progressing   Problem: Education: Goal: Knowledge of General Education information will improve Description: Including pain rating scale, medication(s)/side effects and non-pharmacologic comfort measures Outcome: Progressing   Problem: Health Behavior/Discharge Planning: Goal: Ability to manage health-related needs will improve Outcome: Progressing   Problem: Clinical Measurements: Goal: Ability to maintain clinical measurements within normal limits will improve Outcome: Progressing Goal: Will remain free from infection Outcome: Progressing Goal: Diagnostic test results will improve Outcome: Progressing Goal: Respiratory complications will improve Outcome: Progressing Goal: Cardiovascular complication will be avoided Outcome:  Progressing   Problem: Activity: Goal: Risk for activity intolerance will decrease Outcome: Progressing   Problem: Nutrition: Goal: Adequate nutrition will be maintained Outcome: Progressing   Problem: Coping: Goal: Level of anxiety will decrease Outcome: Progressing   Problem: Elimination: Goal: Will not experience complications related to bowel motility Outcome: Progressing Goal: Will not experience complications related to urinary retention Outcome: Progressing   Problem: Pain Management: Goal: General experience of comfort will improve Outcome: Progressing   Problem: Safety: Goal: Ability to remain free from injury will improve Outcome: Progressing   Problem: Skin Integrity: Goal: Risk for  impaired skin integrity will decrease Outcome: Progressing

## 2023-04-19 NOTE — Plan of Care (Signed)
  Problem: Fluid Volume: Goal: Hemodynamic stability will improve Outcome: Progressing   Problem: Clinical Measurements: Goal: Signs and symptoms of infection will decrease Outcome: Progressing   Problem: Education: Goal: Knowledge of General Education information will improve Description: Including pain rating scale, medication(s)/side effects and non-pharmacologic comfort measures Outcome: Progressing   Problem: Health Behavior/Discharge Planning: Goal: Ability to manage health-related needs will improve Outcome: Progressing   Problem: Nutrition: Goal: Adequate nutrition will be maintained Outcome: Progressing

## 2023-04-19 NOTE — Progress Notes (Signed)
Chaplain paged with request for a Carolyne Fiscal, which chaplain provides. Pt is following along with online lesson and denies other needs.

## 2023-04-20 DIAGNOSIS — M86171 Other acute osteomyelitis, right ankle and foot: Secondary | ICD-10-CM | POA: Diagnosis not present

## 2023-04-20 DIAGNOSIS — A419 Sepsis, unspecified organism: Secondary | ICD-10-CM | POA: Diagnosis not present

## 2023-04-20 DIAGNOSIS — E11628 Type 2 diabetes mellitus with other skin complications: Secondary | ICD-10-CM | POA: Diagnosis not present

## 2023-04-20 DIAGNOSIS — D649 Anemia, unspecified: Secondary | ICD-10-CM | POA: Diagnosis not present

## 2023-04-20 NOTE — Progress Notes (Signed)
[facility]  PROGRESS NOTE  Alan Tapia. OAC:[id number] DOB: [birth year] DOA: 04/06/2023 PCP: Patient, No Pcp Per   Brief HPI:   47 year old male with past medical history significant for left lower extremity BKA, dilated cardiomyopathy, history of combined systolic and diastolic heart failure with EF 35 to 40%, hypertension, insulin-dependent diabetes, peripheral neuropathy, hyperlipidemia who presented to the emergency department on 04/06/2023 with complaints of odor, wound of his foot. He was also having nausea, vomiting and diarrhea. Orthopedic surgery was consulted and patient underwent right BKA 11/27 by Dr. Lajoyce Corners.     Assessment/Plan:   Sepsis secondary to nonhealing right foot ulcer, diabetic foot -Previous history of left BKA -Status post right BKA 11/24 -Blood cultures negative -Follow-up with Dr. Lajoyce Corners outpatient -Wound VAC will be removed at the time of discharge as per Dr. Lajoyce Corners   Microcytic anemia -Combination of iron deficiency anemia and anemia of chronic disease -FOBT negative -Received total 4 unit packed red blood cells transfusions this hospitalization -Hemoglobin stable at 8.6  Hypertension -Blood pressure has been elevated, blood pressure is better after starting metoprolol. -Started metoprolol 50 mg p.o. twice daily, patient was taking metoprolol 100 mg p.o. twice daily at home   Diabetes mellitus -A1c 6.8 -Blood sugar well-controlled   AKI  -Resolved, baseline creatinine 1.1.   -No CKD  Vomiting -He has elevated alk phos -Abdominal ultrasound obtained today shows liver cirrhosis with sludge in the gallbladder -Acute hepatitis panel negative -Vomiting likely due to gastroparesis; improved after starting Reglan 5 mg IV every 6 hours -Started back on regular diet   Medications     (feeding supplement) PROSource Plus  30 mL Oral BID BM   vitamin C  1,000 mg Oral Daily   docusate sodium  100 mg Oral BID   enoxaparin (LOVENOX) injection  40  mg Subcutaneous Q24H   feeding supplement  237 mL Oral BID BM   folic acid  1 mg Oral Daily   Gerhardt'Alan butt cream   Topical BID   lactulose  20 g Oral Daily   metoCLOPramide (REGLAN) injection  5 mg Intravenous Q6H   metoprolol tartrate  50 mg Oral BID   multivitamin with minerals  1 tablet Oral Daily   pantoprazole  40 mg Oral BID   saccharomyces boulardii  250 mg Oral BID   sodium chloride flush  10 mL Intravenous Q12H   sodium chloride flush  3 mL Intravenous Q12H   zinc sulfate (50mg  elemental zinc)  220 mg Oral Daily     Data Reviewed:   CBG:  Recent Labs  Lab 04/18/23 1122  GLUCAP 90    SpO2: 97 % O2 Flow Rate (L/min): 2 L/min    Vitals:   04/19/23 2034 04/20/23 0435 04/20/23 0606 04/20/23 0909  BP: (!) 147/80  129/63 (!) 149/80  Pulse: 89  90 95  Resp: 18  17 18   Temp: 98.2 F (36.8 C)  97.6 F (36.4 C) 98 F (36.7 C)  TempSrc: Oral  Oral Oral  SpO2: 99%  98% 97%  Weight:  (!) 144 kg    Height:          Data Reviewed:  Basic Metabolic Panel: Recent Labs  Lab 04/18/23 1158  NA 137  K 4.0  CL 107  CO2 26  GLUCOSE 93  BUN 13  CREATININE 1.03  CALCIUM 7.6*     CBC: Recent Labs  Lab 04/18/23 1158  WBC 11.2*  HGB 8.4*  HCT 27.7*  MCV 82.2  PLT 504*    LFT Recent Labs  Lab 04/18/23 1158  AST 15  ALT 12  ALKPHOS 202*  BILITOT 0.5  PROT 6.3*  ALBUMIN <1.5*      Antibiotics: Anti-infectives (From admission, onward)    Start     Dose/Rate Route Frequency Ordered Stop   04/09/23 1345  doxycycline (VIBRAMYCIN) 100 mg in dextrose 5 % 250 mL IVPB        100 mg 125 mL/hr over 120 Minutes Intravenous Every 12 hours 04/09/23 1252 04/09/23 1746   04/08/23 1300  levofloxacin (LEVAQUIN) IVPB 500 mg  Status:  Discontinued        500 mg 100 mL/hr over 60 Minutes Intravenous On call to O.R. 04/08/23 1229 04/08/23 1648   04/08/23 1300  vancomycin (VANCOREADY) IVPB 1500 mg/300 mL        1,500 mg 100 mL/hr over 180 Minutes Intravenous  On call to O.R. 04/08/23 1229 04/08/23 1433   04/08/23 1237  vancomycin HCl (VANCOREADY) 1500 MG/300ML IVPB       Note to Pharmacy: [name] D: cabinet override      04/08/23 1237 04/08/23 1439   04/07/23 0800  piperacillin-tazobactam (ZOSYN) IVPB 3.375 g        3.375 g 12.5 mL/hr over 240 Minutes Intravenous Every 8 hours 04/07/23 0108 04/09/23 2108   04/07/23 0100  doxycycline (VIBRAMYCIN) 100 mg in dextrose 5 % 250 mL IVPB  Status:  Discontinued        100 mg 125 mL/hr over 120 Minutes Intravenous Every 12 hours 04/07/23 0057 04/09/23 1252   04/07/23 0000  ceFEPIme (MAXIPIME) 2 g in sodium chloride 0.9 % 100 mL IVPB  Status:  Discontinued        2 g 200 mL/hr over 30 Minutes Intravenous Every 8 hours 04/06/23 2349 04/07/23 0108   04/06/23 2330  metroNIDAZOLE (FLAGYL) IVPB 500 mg        500 mg 100 mL/hr over 60 Minutes Intravenous  Once 04/06/23 2321 04/07/23 0032   04/06/23 2300  piperacillin-tazobactam (ZOSYN) IVPB 3.375 g  Status:  Discontinued        3.375 g 100 mL/hr over 30 Minutes Intravenous  Once 04/06/23 2257 04/06/23 2320   04/06/23 2300  vancomycin (VANCOREADY) IVPB 2000 mg/400 mL  Status:  Discontinued        2,000 mg 200 mL/hr over 120 Minutes Intravenous  Once 04/06/23 2257 04/06/23 2312        DVT prophylaxis: Lovenox, start 04/15/23  Code Status: Full code  Family Communication: Discussed with patient'Alan wife at bedside   CONSULTS orthopedics   Subjective   Denies any complaints.  No nausea or vomiting.   Objective    Physical Examination:  General-appears in no acute distress Heart-S1-S2, regular, no murmur auscultated Lungs-clear to auscultation bilaterally, no wheezing or crackles auscultated Abdomen-soft, nontender, no organomegaly Extremities-bilateral BKA Neuro-alert, oriented x3, no focal deficit noted   Status is: Inpatient:      Pressure Injury 04/13/23 Perineum Bilateral Stage 2 -  Partial thickness loss of dermis presenting  as a shallow open injury with a red, pink wound bed without slough. perineum/scrotum masd on admission (Active)  04/13/23 1100  Location: Perineum  Location Orientation: Bilateral  Staging: Stage 2 -  Partial thickness loss of dermis presenting as a shallow open injury with a red, pink wound bed without slough.  Wound Description (Comments): perineum/scrotum masd on admission  Present on Admission:         Alan Tapia  Alan Tapia   [facility] If 7PM-7AM, please contact night-coverage at [email or web address], Office  [phone]   04/20/2023, 11:11 AM  LOS: 13 days

## 2023-04-20 NOTE — Plan of Care (Signed)
Problem: Fluid Volume: Goal: Hemodynamic stability will improve Outcome: Progressing   Problem: Clinical Measurements: Goal: Diagnostic test results will improve Outcome: Progressing Goal: Signs and symptoms of infection will decrease Outcome: Progressing   Problem: Respiratory: Goal: Ability to maintain adequate ventilation will improve Outcome: Progressing   Problem: Education: Goal: Knowledge of General Education information will improve Description: Including pain rating scale, medication(s)/side effects and non-pharmacologic comfort measures Outcome: Progressing   Problem: Health Behavior/Discharge Planning: Goal: Ability to manage health-related needs will improve Outcome: Progressing   Problem: Clinical Measurements: Goal: Ability to maintain clinical measurements within normal limits will improve Outcome: Progressing Goal: Will remain free from infection Outcome: Progressing Goal: Diagnostic test results will improve Outcome: Progressing Goal: Respiratory complications will improve Outcome: Progressing Goal: Cardiovascular complication will be avoided Outcome: Progressing   Problem: Activity: Goal: Risk for activity intolerance will decrease Outcome: Progressing   Problem: Nutrition: Goal: Adequate nutrition will be maintained Outcome: Progressing   Problem: Coping: Goal: Level of anxiety will decrease Outcome: Progressing   Problem: Elimination: Goal: Will not experience complications related to bowel motility Outcome: Progressing Goal: Will not experience complications related to urinary retention Outcome: Progressing   Problem: Pain Management: Goal: General experience of comfort will improve Outcome: Progressing   Problem: Safety: Goal: Ability to remain free from injury will improve Outcome: Progressing   Problem: Skin Integrity: Goal: Risk for impaired skin integrity will decrease Outcome: Progressing   Problem: Activity: Goal: Ability  to tolerate increased activity will improve Outcome: Progressing   Problem: Clinical Measurements: Goal: Ability to maintain a body temperature in the normal range will improve Outcome: Progressing   Problem: Respiratory: Goal: Ability to maintain adequate ventilation will improve Outcome: Progressing Goal: Ability to maintain a clear airway will improve Outcome: Progressing   Problem: Education: Goal: Knowledge of the prescribed therapeutic regimen will improve Outcome: Progressing Goal: Ability to verbalize activity precautions or restrictions will improve Outcome: Progressing Goal: Understanding of discharge needs will improve Outcome: Progressing   Problem: Activity: Goal: Ability to perform//tolerate increased activity and mobilize with assistive devices will improve Outcome: Progressing   Problem: Clinical Measurements: Goal: Postoperative complications will be avoided or minimized Outcome: Progressing   Problem: Self-Care: Goal: Ability to meet self-care needs will improve Outcome: Progressing   Problem: Self-Concept: Goal: Ability to maintain and perform role responsibilities to the fullest extent possible will improve Outcome: Progressing   Problem: Education: Goal: Knowledge of General Education information will improve Description: Including pain rating scale, medication(s)/side effects and non-pharmacologic comfort measures Outcome: Progressing   Problem: Health Behavior/Discharge Planning: Goal: Ability to manage health-related needs will improve Outcome: Progressing   Problem: Clinical Measurements: Goal: Ability to maintain clinical measurements within normal limits will improve Outcome: Progressing Goal: Will remain free from infection Outcome: Progressing Goal: Diagnostic test results will improve Outcome: Progressing Goal: Respiratory complications will improve Outcome: Progressing Goal: Cardiovascular complication will be avoided Outcome:  Progressing   Problem: Activity: Goal: Risk for activity intolerance will decrease Outcome: Progressing   Problem: Nutrition: Goal: Adequate nutrition will be maintained Outcome: Progressing   Problem: Coping: Goal: Level of anxiety will decrease Outcome: Progressing   Problem: Elimination: Goal: Will not experience complications related to bowel motility Outcome: Progressing Goal: Will not experience complications related to urinary retention Outcome: Progressing   Problem: Pain Management: Goal: General experience of comfort will improve Outcome: Progressing   Problem: Safety: Goal: Ability to remain free from injury will improve Outcome: Progressing   Problem: Skin Integrity: Goal: Risk for  impaired skin integrity will decrease Outcome: Progressing

## 2023-04-20 NOTE — Progress Notes (Signed)
Occupational Therapy Treatment Patient Details Name: Alan Tapia. MRN: [id number] DOB: 11-09-1975 Today's Date: 04/20/2023   History of present illness The pt is a 47 yo male presenting 11/25 with nausea, vomiting, and fever as well as R foot wound. 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   OT comments  Patient in supine upon entry and patient's wife stating patient was hyperventilating. Nursing was retrieved and patient stated he was feeling better, just anxious/nervous about returning home. Patient able to get to EOB with cues for rail use and assistance with BLEs. Patient performed grooming tasks and UE HEP seated on EOB with supervision and verbal cues. Patient declined lateral scooting due to pain. Patient assisted back to supine and with positioning in bed. Patient will benefit from continued inpatient follow up therapy, <3 hours/day to continue to address self care, bed mobility, and functional transfers.       If plan is discharge home, recommend the following:  Two people to help with walking and/or transfers;Two people to help with bathing/dressing/bathroom;Assistance with cooking/housework;Assist for transportation;Help with stairs or ramp for entrance   Equipment Recommendations  Hoyer lift    Recommendations for Other Services      Precautions / Restrictions Precautions Precautions: Fall;Other (comment) Precaution Comments: RLE limb protector Restrictions Weight Bearing Restrictions: Yes RLE Weight Bearing: Non weight bearing Other Position/Activity Restrictions: bilateral BKA       Mobility Bed Mobility Overal bed mobility: Needs Assistance Bed Mobility: Rolling, Supine to Sit, Sit to Supine Rolling: Min assist   Supine to sit: Min assist, HOB elevated, Used rails Sit to supine: Min assist, Used  rails   General bed mobility comments: assistance with BLEs to get to EOB and to return to supine, Patient performed rolling for positioning in bed    Transfers Overall transfer level: Needs assistance                 General transfer comment: patient declined OOB and scooting on EOB due to patient stated he was sore from bedsores     Balance Overall balance assessment: Needs assistance Sitting-balance support: Bilateral upper extremity supported, Feet unsupported Sitting balance-Leahy Scale: Fair Sitting balance - Comments: able to perform grooming and BUE HEP seated on EOB                                   ADL either performed or assessed with clinical judgement   ADL Overall ADL's : Needs assistance/impaired     Grooming: Wash/dry hands;Wash/dry face;Oral care;Set up;Supervision/safety;Sitting Grooming Details (indicate cue type and reason): on EOB                               General ADL Comments: focused on EOB sitting balance    Extremity/Trunk Assessment              Vision       Perception     Praxis      Cognition Arousal: Alert Behavior During Therapy: WFL for tasks assessed/performed Overall Cognitive Status: Within Functional Limits for tasks assessed  General Comments: patient's wife stated patient was hyperventilating upon entry and nurse was retrieved. Patient stated he had anxiety about returning home        Exercises Exercises: General Upper Extremity General Exercises - Upper Extremity Shoulder Flexion: Strengthening, Both, 10 reps, Seated, Theraband Theraband Level (Shoulder Flexion): Level 2 (Red) Shoulder Horizontal ABduction: Strengthening, Both, 10 reps, Seated, Theraband Theraband Level (Shoulder Horizontal Abduction): Level 2 (Red) Elbow Flexion: Strengthening, Both, 10 reps, Seated, Theraband Theraband Level (Elbow Flexion): Level 2 (Red) Elbow  Extension: Strengthening, Both, 10 reps, Seated, Theraband Theraband Level (Elbow Extension): Level 2 (Red)    Shoulder Instructions       General Comments      Pertinent Vitals/ Pain       Pain Assessment Pain Assessment: Faces Faces Pain Scale: Hurts little more Pain Location: bottom with scooting on EOB Pain Descriptors / Indicators: Grimacing, Discomfort Pain Intervention(s): Limited activity within patient's tolerance, Monitored during session, Repositioned  Home Living                                          Prior Functioning/Environment              Frequency  Min 1X/week        Progress Toward Goals  OT Goals(current goals can now be found in the care plan section)  Progress towards OT goals: Progressing toward goals  Acute Rehab OT Goals Patient Stated Goal: get better OT Goal Formulation: With patient Time For Goal Achievement: 04/24/23 Potential to Achieve Goals: Good ADL Goals Pt Will Perform Grooming: with supervision;sitting Pt Will Perform Upper Body Bathing: with contact guard assist;sitting Pt Will Perform Upper Body Dressing: with modified independence;sitting Pt Will Transfer to Toilet: with mod assist;with transfer board Pt Will Perform Toileting - Clothing Manipulation and hygiene: with min assist;sitting/lateral leans Pt/caregiver will Perform Home Exercise Program: Increased strength;Both right and left upper extremity;With theraband;With theraputty;Independently;With written HEP provided  Plan      Co-evaluation                 AM-PAC OT "6 Clicks" Daily Activity     Outcome Measure   Help from another person eating meals?: A Little Help from another person taking care of personal grooming?: A Little Help from another person toileting, which includes using toliet, bedpan, or urinal?: Total Help from another person bathing (including washing, rinsing, drying)?: A Lot Help from another person to put on and  taking off regular upper body clothing?: A Little Help from another person to put on and taking off regular lower body clothing?: Total 6 Click Score: 13    End of Session    OT Visit Diagnosis: Other abnormalities of gait and mobility (R26.89);Muscle weakness (generalized) (M62.81);Other (comment)   Activity Tolerance Patient limited by pain   Patient Left in bed;with call bell/phone within reach;with bed alarm set;with family/visitor present   Nurse Communication Mobility status        Time: 6962-9528 OT Time Calculation (min): 34 min  Charges: OT General Charges $OT Visit: 1 Visit OT Treatments $Self Care/Home Management : 8-22 mins $Therapeutic Activity: 8-22 mins  Alfonse Flavors, OTA Acute Rehabilitation Services  Office [phone]   Dewain Penning 04/20/2023, 1:58 PM

## 2023-04-20 NOTE — TOC Progression Note (Signed)
Transition of Care (TOC) - Progression Note    Patient Details  Name: Alan Tapia. MRN: [id number] Date of Birth: 16-Jul-1975  Transition of Care [facility]) CM/SW Contact  Carley Hammed, LCSW Phone Number: 04/20/2023, 2:50 PM  Clinical Narrative:     CSW attempted to follow up again with contacts provided by spouse. CSW also left VM for the supervisors. CSW sent emails to the provided addresses. CSW attempted to speak with wife, VM left. CSW confirmed that pt will need disability approved prior to being eligible for SNF. Pt requested x2 updates... at this time there aren't any. CSW never received a phone call from pt's mother, waiting on updates from spouse and ART. TOC continues to follow.   Expected Discharge Plan: Skilled Nursing Facility Barriers to Discharge: Insurance Authorization, Continued Medical Work up, SNF Pending bed offer  Expected Discharge Plan and Services In-house Referral: Clinical Social Work     Living arrangements for the past 2 months: Single Family Home Expected Discharge Date: 04/11/23                                     Social Determinants of Health (SDOH) Interventions SDOH Screenings   Food Insecurity: No Food Insecurity (04/07/2023)  Housing: Medium Risk (04/07/2023)  Transportation Needs: No Transportation Needs (04/07/2023)  Recent Concern: Transportation Needs - Unmet Transportation Needs (02/17/2023)   Received from [facility]  Utilities: At Risk (04/07/2023)  Alcohol Screen: Low Risk  (08/20/2022)  Financial Resource Strain: Patient Unable To Answer (08/20/2022)  Tobacco Use: Low Risk  (04/08/2023)    Readmission Risk Interventions    04/07/2023    2:34 PM  Readmission Risk Prevention Plan  Transportation Screening Complete  PCP or Specialist Appt within 3-5 Days Complete  HRI or Home Care Consult Complete  Social Work Consult for Recovery Care Planning/Counseling Complete  Palliative Care Screening Not Applicable   Medication Review Oceanographer) Complete

## 2023-04-21 DIAGNOSIS — A419 Sepsis, unspecified organism: Secondary | ICD-10-CM | POA: Diagnosis not present

## 2023-04-21 DIAGNOSIS — M86171 Other acute osteomyelitis, right ankle and foot: Secondary | ICD-10-CM | POA: Diagnosis not present

## 2023-04-21 DIAGNOSIS — E11628 Type 2 diabetes mellitus with other skin complications: Secondary | ICD-10-CM | POA: Diagnosis not present

## 2023-04-21 DIAGNOSIS — D649 Anemia, unspecified: Secondary | ICD-10-CM | POA: Diagnosis not present

## 2023-04-21 NOTE — Progress Notes (Signed)
Physical Therapy Treatment Patient Details Name: Alan Tapia. MRN: [id number] DOB: [birth year] Today's Date: 04/21/2023   History of Present Illness The pt is a 47 yo male presenting 11/25 with nausea, vomiting, and fever as well as R foot wound. 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    PT Comments  Pt greeted resting in bed and agreeable to session with steady progress towards acute goals. Pt able to come to sitting EOB with increased time and min A to elevate trunk. Pt demonstrating some hesitancy for transfers however agreeable to initiate practice for A/P transfers and able to scoot posterior from EOB to ~middle of bed with mod A before max fatigue. Pt voicing concern for not being able to return to bed post transfer, educated pt on option for lift transfers back to bed with pt verbalizing understanding however declining attempts this session. Pt with c/o sacral pain during session, educated pt on pressure relief techniques and schedule when in bed with pt verbalizing understanding. Pt continues to benefit from skilled PT services to progress toward functional mobility goals.     If plan is discharge home, recommend the following: Two people to help with walking and/or transfers;Assist for transportation;Assistance with cooking/housework;Help with stairs or ramp for entrance   Can travel by private vehicle     No  Equipment Recommendations  Hoyer lift;Wheelchair cushion (measurements PT);Wheelchair (measurements PT)    Recommendations for Other Services       Precautions / Restrictions Precautions Precautions: Fall;Other (comment) Precaution Comments: RLE limb protector Restrictions Weight Bearing Restrictions: Yes RLE Weight Bearing: Non weight bearing Other Position/Activity Restrictions: bilateral BKA      Mobility  Bed Mobility Overal bed mobility: Needs Assistance Bed Mobility: Rolling, Supine to Sit, Sit to Supine Rolling: Min assist   Supine to sit: Min assist, HOB elevated, Used rails Sit to supine: Min assist, Used rails   General bed mobility comments: assistance with BLEs to get to EOB and to return to supine, Patient performed rolling for positioning in bed    Transfers Overall transfer level: Needs assistance Equipment used: None Transfers: Bed to chair/wheelchair/BSC            Lateral/Scoot Transfers: Mod assist General transfer comment: laterally scooting along EOB and posterior while seated up EOB to prepare for A/P trasnfer, pt declining actual trasnfer due to pain with scooting    Ambulation/Gait               General Gait Details: unable   Stairs             Wheelchair Mobility     Tilt Bed    Modified Rankin (Stroke Patients Only)       Balance Overall balance assessment: Needs assistance Sitting-balance support: Bilateral upper extremity supported, Feet unsupported Sitting balance-Leahy Scale: Fair Sitting balance - Comments: able to perform grooming and BUE HEP seated on EOB                                    Cognition Arousal: Alert Behavior During Therapy: WFL for tasks assessed/performed Overall Cognitive Status: Within Functional Limits for tasks assessed  Exercises      General Comments General comments (skin integrity, edema, etc.): Significnat other at bedside and assisting. encouraged pt to be up EOB for meals and educated pt on sidelying R/L thoughout day for pressure relief d/t pain/wound on bottom      Pertinent Vitals/Pain Pain Assessment Pain Assessment: Faces Faces Pain Scale: Hurts little more Pain Location: bottom with scooting on EOB Pain Descriptors / Indicators: Grimacing, Discomfort Pain Intervention(s): Monitored during  session, Limited activity within patient's tolerance, Repositioned, Other (comment) (educated pt on sidelying and importance of pressure relief)    Home Living                          Prior Function            PT Goals (current goals can now be found in the care plan section) Acute Rehab PT Goals Patient Stated Goal: get bil prosthetics PT Goal Formulation: With patient Time For Goal Achievement: 04/24/23 Progress towards PT goals: Progressing toward goals    Frequency    Min 1X/week      PT Plan      Co-evaluation              AM-PAC PT "6 Clicks" Mobility   Outcome Measure  Help needed turning from your back to your side while in a flat bed without using bedrails?: A Lot Help needed moving from lying on your back to sitting on the side of a flat bed without using bedrails?: A Lot Help needed moving to and from a bed to a chair (including a wheelchair)?: Total Help needed standing up from a chair using your arms (e.g., wheelchair or bedside chair)?: Total Help needed to walk in hospital room?: Total Help needed climbing 3-5 steps with a railing? : Total 6 Click Score: 8    End of Session Equipment Utilized During Treatment: Other (comment) (R limb protector) Activity Tolerance: Patient limited by fatigue;No increased pain Patient left: in bed;with call bell/phone within reach;with family/visitor present Nurse Communication: Mobility status;Need for lift equipment PT Visit Diagnosis: Other abnormalities of gait and mobility (R26.89);Muscle weakness (generalized) (M62.81);Pain Pain - Right/Left:  (abdomen) Pain - part of body:  (abdomen)     Time: 8657-8469 PT Time Calculation (min) (ACUTE ONLY): 28 min  Charges:    $Therapeutic Activity: 23-37 mins PT General Charges $$ ACUTE PT VISIT: 1 Visit                     Shauntea Lok R. PTA Acute Rehabilitation Services Office: [phone]   Catalina Antigua 04/21/2023, 3:56 PM

## 2023-04-21 NOTE — TOC Progression Note (Signed)
Transition of Care (TOC) - Progression Note    Patient Details  Name: Alan Tapia. MRN: [id number] Date of Birth: Dec 19, 1975  Transition [facility]) CM/SW Contact  Carley Hammed, LCSW Phone Number: 04/21/2023, 11:51 AM  Clinical Narrative:    CSW received a response back from the Wing, [facility]. "Thank you for reaching out. I'm copying Ms. Waldorf's reviewer, Edrick Oh, to this email as well. My understanding of the situation, is that with Ms. Magda being in arrearages with [facility], they are unable to accommodate the request to move to a new, handicap accessible apartment. Onalee Hua is processing her application for rental assistance to help with the backed rent. We are waiting for the landlord to provide an updated ledger, lease and W9, so we can process the case. Based on my conversation with [name] last week, Laurena Bering is working with her to resolve the issue, and referred her to [place] for help. Onalee Hua should be able to keep you in the loop as we move forward. Hopefully, we can get this resolved quickly."   CSW spoke with [place] with [facility] who stated that as is, pt cannot return to the apartment they are in. There are steps up to the apartment and if he could get inside, there is no room for equipment and he would not be able to use any of the bathrooms/ kitchen/ etc. Hale Bogus is working on getting updates on the disability, which is the major barrier to SNF placement. Hale Bogus has been working with [employer] at [employer], [employer] sent requesting updates.   CSW met with pt and spouse at bedside to discuss current barriers. Spouse provided phone # to disability case manager, unsure of name, [phone]. Pt and spouse advised of continued barriers to DC. Spouse continues to work with different agencies to determine what assistance is available. TOC will continue to follow.    Expected Discharge Plan: Skilled Nursing Facility Barriers to Discharge: Insurance  Authorization, Continued Medical Work up, SNF Pending bed offer  Expected Discharge Plan and Services In-house Referral: Clinical Social Work     Living arrangements for the past 2 months: Single Family Home Expected Discharge Date: 04/11/23                                     Social Determinants of Health (SDOH) Interventions SDOH Screenings   Food Insecurity: No Food Insecurity (04/07/2023)  Housing: Medium Risk (04/07/2023)  Transportation Needs: No Transportation Needs (04/07/2023)  Recent Concern: Transportation Needs - Unmet Transportation Needs (02/17/2023)   Received from [facility]  Utilities: At Risk (04/07/2023)  Alcohol Screen: Low Risk  (08/20/2022)  Financial Resource Strain: Patient Unable To Answer (08/20/2022)  Tobacco Use: Low Risk  (04/08/2023)    Readmission Risk Interventions    04/07/2023    2:34 PM  Readmission Risk Prevention Plan  Transportation Screening Complete  PCP or Specialist Appt within 3-5 Days Complete  HRI or Home Care Consult Complete  Social Work Consult for Recovery Care Planning/Counseling Complete  Palliative Care Screening Not Applicable  Medication Review Oceanographer) Complete

## 2023-04-21 NOTE — Progress Notes (Signed)
[facility]  PROGRESS NOTE  Alan Tapia. UJW:[id number] DOB: 23-Mar-1976 DOA: 04/06/2023 PCP: Patient, No Pcp Per   Brief HPI:   47 year old male with past medical history significant for left lower extremity BKA, dilated cardiomyopathy, history of combined systolic and diastolic heart failure with EF 35 to 40%, hypertension, insulin-dependent diabetes, peripheral neuropathy, hyperlipidemia who presented to the emergency department on 04/06/2023 with complaints of odor, wound of his foot. He was also having nausea, vomiting and diarrhea. Orthopedic surgery was consulted and patient underwent right BKA 11/27 by Dr. Lajoyce Corners.     Assessment/Plan:   Sepsis secondary to nonhealing right foot ulcer, diabetic foot -Previous history of left BKA -Status post right BKA 11/24 -Blood cultures negative -Follow-up with Dr. Lajoyce Corners outpatient -Wound VAC will be removed at the time of discharge as per Dr. Lajoyce Corners   Microcytic anemia -Combination of iron deficiency anemia and anemia of chronic disease -FOBT negative -Received total 4 unit packed red blood cells transfusions this hospitalization -Hemoglobin stable at 8.6  Hypertension -Blood pressure has been elevated, blood pressure is better after starting metoprolol. -Started metoprolol 50 mg p.o. twice daily, patient was taking metoprolol 100 mg p.o. twice daily at home   Diabetes mellitus -A1c 6.8 -Blood sugar well-controlled   AKI  -Resolved, baseline creatinine 1.1.   -No CKD  Vomiting -He has elevated alk phos -Abdominal ultrasound obtained today shows liver cirrhosis with sludge in the gallbladder -Acute hepatitis panel negative -Vomiting likely due to gastroparesis; improved after starting Reglan 5 mg IV every 6 hours -Started back on regular diet -Continue Protonix 40 mg p.o. twice daily   Medications     (feeding supplement) PROSource Plus  30 mL Oral BID BM   vitamin C  1,000 mg Oral Daily   docusate sodium  100 mg  Oral BID   enoxaparin (LOVENOX) injection  40 mg Subcutaneous Q24H   feeding supplement  237 mL Oral BID BM   folic acid  1 mg Oral Daily   Gerhardt's butt cream   Topical BID   lactulose  20 g Oral Daily   metoCLOPramide (REGLAN) injection  5 mg Intravenous Q6H   metoprolol tartrate  50 mg Oral BID   multivitamin with minerals  1 tablet Oral Daily   pantoprazole  40 mg Oral BID   saccharomyces boulardii  250 mg Oral BID   sodium chloride flush  10 mL Intravenous Q12H   sodium chloride flush  3 mL Intravenous Q12H   zinc sulfate (50mg  elemental zinc)  220 mg Oral Daily     Data Reviewed:   CBG:  Recent Labs  Lab 04/18/23 1122  GLUCAP 90    SpO2: 96 % O2 Flow Rate (L/min): 2 L/min    Vitals:   04/20/23 1619 04/20/23 2047 04/21/23 0435 04/21/23 0823  BP: (!) 146/80 (!) 142/78 (!) 146/81 (!) 149/82  Pulse: 88 91 90 98  Resp: 18 16 16 18   Temp:  98.4 F (36.9 C) 98.1 F (36.7 C) 98.2 F (36.8 C)  TempSrc:  Oral Oral Oral  SpO2: 98% 99% 95% 96%  Weight:      Height:          Data Reviewed:  Basic Metabolic Panel: Recent Labs  Lab 04/18/23 1158  NA 137  K 4.0  CL 107  CO2 26  GLUCOSE 93  BUN 13  CREATININE 1.03  CALCIUM 7.6*     CBC: Recent Labs  Lab 04/18/23 1158  WBC 11.2*  HGB  8.4*  HCT 27.7*  MCV 82.2  PLT 504*    LFT Recent Labs  Lab 04/18/23 1158  AST 15  ALT 12  ALKPHOS 202*  BILITOT 0.5  PROT 6.3*  ALBUMIN <1.5*      Antibiotics: Anti-infectives (From admission, onward)    Start     Dose/Rate Route Frequency Ordered Stop   04/09/23 1345  doxycycline (VIBRAMYCIN) 100 mg in dextrose 5 % 250 mL IVPB        100 mg 125 mL/hr over 120 Minutes Intravenous Every 12 hours 04/09/23 1252 04/09/23 1746   04/08/23 1300  levofloxacin (LEVAQUIN) IVPB 500 mg  Status:  Discontinued        500 mg 100 mL/hr over 60 Minutes Intravenous On call to O.R. 04/08/23 1229 04/08/23 1648   04/08/23 1300  vancomycin (VANCOREADY) IVPB 1500 mg/300  mL        1,500 mg 100 mL/hr over 180 Minutes Intravenous On call to O.R. 04/08/23 1229 04/08/23 1433   04/08/23 1237  vancomycin HCl (VANCOREADY) 1500 MG/300ML IVPB       Note to Pharmacy: [name] D: cabinet override      04/08/23 1237 04/08/23 1439   04/07/23 0800  piperacillin-tazobactam (ZOSYN) IVPB 3.375 g        3.375 g 12.5 mL/hr over 240 Minutes Intravenous Every 8 hours 04/07/23 0108 04/09/23 2108   04/07/23 0100  doxycycline (VIBRAMYCIN) 100 mg in dextrose 5 % 250 mL IVPB  Status:  Discontinued        100 mg 125 mL/hr over 120 Minutes Intravenous Every 12 hours 04/07/23 0057 04/09/23 1252   04/07/23 0000  ceFEPIme (MAXIPIME) 2 g in sodium chloride 0.9 % 100 mL IVPB  Status:  Discontinued        2 g 200 mL/hr over 30 Minutes Intravenous Every 8 hours 04/06/23 2349 04/07/23 0108   04/06/23 2330  metroNIDAZOLE (FLAGYL) IVPB 500 mg        500 mg 100 mL/hr over 60 Minutes Intravenous  Once 04/06/23 2321 04/07/23 0032   04/06/23 2300  piperacillin-tazobactam (ZOSYN) IVPB 3.375 g  Status:  Discontinued        3.375 g 100 mL/hr over 30 Minutes Intravenous  Once 04/06/23 2257 04/06/23 2320   04/06/23 2300  vancomycin (VANCOREADY) IVPB 2000 mg/400 mL  Status:  Discontinued        2,000 mg 200 mL/hr over 120 Minutes Intravenous  Once 04/06/23 2257 04/06/23 2312        DVT prophylaxis: Lovenox, start 04/15/23  Code Status: Full code  Family Communication: Discussed with patient's wife at bedside   CONSULTS orthopedics   Subjective   Had 1 episode of vomiting this morning.  Denies abdominal pain   Objective    Physical Examination:  General-appears in no acute distress Heart-S1-S2, regular, no murmur auscultated Lungs-clear to auscultation bilaterally, no wheezing or crackles auscultated Abdomen-soft, nontender, no organomegaly Extremities-bilateral BKA Neuro-alert, oriented x3, no focal deficit noted Status is: Inpatient:      Pressure Injury 04/13/23  Perineum Bilateral Stage 2 -  Partial thickness loss of dermis presenting as a shallow open injury with a red, pink wound bed without slough. perineum/scrotum masd on admission (Active)  04/13/23 1100  Location: Perineum  Location Orientation: Bilateral  Staging: Stage 2 -  Partial thickness loss of dermis presenting as a shallow open injury with a red, pink wound bed without slough.  Wound Description (Comments): perineum/scrotum masd on admission  Present on Admission:  Meredeth Ide   [facility] If 7PM-7AM, please contact night-coverage at [email or web address], Office  [phone]   04/21/2023, 10:59 AM  LOS: 14 days

## 2023-04-21 NOTE — Plan of Care (Signed)
Problem: Fluid Volume: Goal: Hemodynamic stability will improve Outcome: Progressing   Problem: Clinical Measurements: Goal: Diagnostic test results will improve Outcome: Progressing Goal: Signs and symptoms of infection will decrease Outcome: Progressing   Problem: Respiratory: Goal: Ability to maintain adequate ventilation will improve Outcome: Progressing   Problem: Education: Goal: Knowledge of General Education information will improve Description: Including pain rating scale, medication(s)/side effects and non-pharmacologic comfort measures Outcome: Progressing   Problem: Health Behavior/Discharge Planning: Goal: Ability to manage health-related needs will improve Outcome: Progressing   Problem: Clinical Measurements: Goal: Ability to maintain clinical measurements within normal limits will improve Outcome: Progressing Goal: Will remain free from infection Outcome: Progressing Goal: Diagnostic test results will improve Outcome: Progressing Goal: Respiratory complications will improve Outcome: Progressing Goal: Cardiovascular complication will be avoided Outcome: Progressing   Problem: Activity: Goal: Risk for activity intolerance will decrease Outcome: Progressing   Problem: Nutrition: Goal: Adequate nutrition will be maintained Outcome: Progressing   Problem: Coping: Goal: Level of anxiety will decrease Outcome: Progressing   Problem: Elimination: Goal: Will not experience complications related to bowel motility Outcome: Progressing Goal: Will not experience complications related to urinary retention Outcome: Progressing   Problem: Pain Management: Goal: General experience of comfort will improve Outcome: Progressing   Problem: Safety: Goal: Ability to remain free from injury will improve Outcome: Progressing   Problem: Skin Integrity: Goal: Risk for impaired skin integrity will decrease Outcome: Progressing   Problem: Activity: Goal: Ability  to tolerate increased activity will improve Outcome: Progressing   Problem: Clinical Measurements: Goal: Ability to maintain a body temperature in the normal range will improve Outcome: Progressing   Problem: Respiratory: Goal: Ability to maintain adequate ventilation will improve Outcome: Progressing Goal: Ability to maintain a clear airway will improve Outcome: Progressing   Problem: Education: Goal: Knowledge of the prescribed therapeutic regimen will improve Outcome: Progressing Goal: Ability to verbalize activity precautions or restrictions will improve Outcome: Progressing Goal: Understanding of discharge needs will improve Outcome: Progressing   Problem: Activity: Goal: Ability to perform//tolerate increased activity and mobilize with assistive devices will improve Outcome: Progressing   Problem: Clinical Measurements: Goal: Postoperative complications will be avoided or minimized Outcome: Progressing   Problem: Self-Care: Goal: Ability to meet self-care needs will improve Outcome: Progressing   Problem: Self-Concept: Goal: Ability to maintain and perform role responsibilities to the fullest extent possible will improve Outcome: Progressing   Problem: Education: Goal: Knowledge of General Education information will improve Description: Including pain rating scale, medication(s)/side effects and non-pharmacologic comfort measures Outcome: Progressing   Problem: Health Behavior/Discharge Planning: Goal: Ability to manage health-related needs will improve Outcome: Progressing   Problem: Clinical Measurements: Goal: Ability to maintain clinical measurements within normal limits will improve Outcome: Progressing Goal: Will remain free from infection Outcome: Progressing Goal: Diagnostic test results will improve Outcome: Progressing Goal: Respiratory complications will improve Outcome: Progressing Goal: Cardiovascular complication will be avoided Outcome:  Progressing   Problem: Activity: Goal: Risk for activity intolerance will decrease Outcome: Progressing   Problem: Nutrition: Goal: Adequate nutrition will be maintained Outcome: Progressing   Problem: Coping: Goal: Level of anxiety will decrease Outcome: Progressing   Problem: Elimination: Goal: Will not experience complications related to bowel motility Outcome: Progressing Goal: Will not experience complications related to urinary retention Outcome: Progressing   Problem: Pain Management: Goal: General experience of comfort will improve Outcome: Progressing   Problem: Safety: Goal: Ability to remain free from injury will improve Outcome: Progressing   Problem: Skin Integrity: Goal: Risk for  impaired skin integrity will decrease Outcome: Progressing

## 2023-04-21 NOTE — Progress Notes (Addendum)
Nutrition Follow-up  DOCUMENTATION CODES:   Non-severe (moderate) malnutrition in context of chronic illness  INTERVENTION:   - Continue Regular diet and encourage protein/PO intake  - Provide  Mighty Shake TID with meals, each supplement provides 330 kcals and 9 grams of protein - Ensure Enlive po BID, each supplement provides 350 kcal and 20 grams of protein. - Continue Prosource Plus BID - Continue MVI with minerals, folic acid, Vitamin C, and Zinc daily - Continue double protein to meals  NUTRITION DIAGNOSIS:   Moderate Malnutrition related to chronic illness as evidenced by energy intake < or equal to 75% for > or equal to 1 month, moderate fat depletion, moderate muscle depletion.  - Ongoing, but being addressed by double proteins with meals, and oral nutritional supplements   GOAL:   Patient will meet greater than or equal to 90% of their needs  - progressing   MONITOR:   PO intake, Supplement acceptance, Weight trends  REASON FOR ASSESSMENT:   Consult Assessment of nutrition requirement/status, Calorie Count  ASSESSMENT:   47 y.o. M presented  from home with N/V/D x 2 weeks and right foot wound, admitted with sepsis. PMH; type 2 diabetes, CHF, BKA of left lower extremity and ongoing diabetic foot infection, s/p RBKA.  4/5 - LBKA 11/27- RBKA 12/7 - Reglan started   Today on visit pt was not feeling well and reports vomiting 15 times this morning. Wife was present on visit. Pt states he has been doing better with his protein and PO intake and his appetite was improving until yesterday when he started to feel nauseas. Pt reports having abdominal pain and loose stools today.   MD ordered Relgan to help with vomiting, suspect pt has gastroparesis due to diabetes.   Family reports pt is slowly doing better with his PO intake but it still takes him a while to eat. Per documentation his meal intake varies from 25-100% before his recent N/V. Pt has been refusing some of  his Ensures and mostly only drinks 1 per/day.  Pt says he also drinks 1 Fairlife Corepower Protein shake per/day. He has also been refusing some of his ProSource Plus. Pt is willing to try Mighty shakes instead of Magic cups, prefers vanilla.   Discussed with pt the importance of protein with wound healing and overall health. Encouraged pt to drink his supplements and order a protein with every meal. Pt wanted to have snacks of hard boiled eggs, raisin bran, bagel, chocolate pudding,  and cream cheese.   Pt has been having loose stools, messaged MD to deescalate bowel regimen. If pt continues to have loose stools recommend adding Banatrol.   - Pt does not like Magic cups  Admit weight: 144.8  kg  Current weight: 144 kg    Average Meal Intake: 12/3-12/10: 57% intake x 7 recorded meals  Nutritionally Relevant Medications: Scheduled Meds:  (feeding supplement) PROSource Plus  30 mL Oral BID BM   vitamin C  1,000 mg Oral Daily   docusate sodium  100 mg Oral BID   folic acid  1 mg Oral Daily   lactulose  20 g Oral Daily   metoCLOPramide (REGLAN) injection  5 mg Intravenous Q6H   multivitamin with minerals  1 tablet Oral Daily   zinc sulfate (50mg  elemental zinc)  220 mg Oral Daily   Labs Reviewed: Calcium 7.6  HgbA1c 6.8  Diet Order:   Diet Order             Diet  regular Room service appropriate? Yes; Fluid consistency: Thin  Diet effective now           Diet - low sodium heart healthy                   EDUCATION NEEDS:   Education needs have been addressed  Skin:  Skin Assessment: Reviewed RN Assessment Skin Integrity Issues:: Stage II, Incisions, Wound VAC Stage II: [place] Wound Vac: R knee Incisions: R knee  Last BM:  04/20/2023, type 6  Height:   Ht Readings from Last 1 Encounters:  04/08/23 6\' 4"  (1.93 m)    Weight:   Wt Readings from Last 1 Encounters:  04/20/23 (!) 144 kg    Ideal Body Weight:  79.16 kg  BMI:  Body mass index is 38.64  kg/m.  Estimated Nutritional Needs:   Kcal:  2450- 2800 kcal/d  Protein:  140-160 gm  Fluid:  >2.2L   Elliot Dally, RD Registered Dietitian  See Amion for more information

## 2023-04-21 NOTE — Discharge Instructions (Signed)

## 2023-04-22 DIAGNOSIS — D649 Anemia, unspecified: Secondary | ICD-10-CM | POA: Diagnosis not present

## 2023-04-22 DIAGNOSIS — N182 Chronic kidney disease, stage 2 (mild): Secondary | ICD-10-CM | POA: Diagnosis not present

## 2023-04-22 DIAGNOSIS — A419 Sepsis, unspecified organism: Secondary | ICD-10-CM | POA: Diagnosis not present

## 2023-04-22 DIAGNOSIS — E119 Type 2 diabetes mellitus without complications: Secondary | ICD-10-CM | POA: Diagnosis not present

## 2023-04-22 LAB — GLUCOSE, CAPILLARY
Glucose-Capillary: 103 mg/dL — ABNORMAL HIGH (ref 70–99)
Glucose-Capillary: 97 mg/dL (ref 70–99)
Glucose-Capillary: 85 mg/dL (ref 70–99)

## 2023-04-22 MED ORDER — INSULIN ASPART 100 UNIT/ML IJ SOLN
0.0000 [IU] | Freq: Three times a day (TID) | INTRAMUSCULAR | Status: DC
  Administered 2023-05-02 – 2023-05-03 (×2): 1 [IU] via SUBCUTANEOUS
  Administered 2023-05-17 – 2023-05-20 (×2): 2 [IU] via SUBCUTANEOUS

## 2023-04-22 MED ORDER — SIMETHICONE 80 MG PO CHEW
80.0000 mg | CHEWABLE_TABLET | Freq: Four times a day (QID) | ORAL | Status: AC | PRN
  Administered 2023-04-22 – 2023-04-24 (×2): 80 mg via ORAL
  Filled 2023-04-22 (×2): qty 1

## 2023-04-22 MED ORDER — SIMETHICONE 80 MG PO CHEW
80.0000 mg | CHEWABLE_TABLET | ORAL | Status: AC | PRN
  Administered 2023-04-22 (×2): 80 mg via ORAL
  Filled 2023-04-22 (×2): qty 1

## 2023-04-22 NOTE — Progress Notes (Signed)
[facility]                                                                               Alan Tapia, is a 47 y.o. 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 He was also having nausea, vomiting and diarrhea. Orthopedic surgery was consulted and patient underwent right BKA 11/27 by Dr. Lajoyce Corners.     Assessment & Plan    Assessment and Plan:  Sepsis sec to Non healing right foot ulcer Previous history of left BKA -Status post right BKA 11/24 -Blood cultures negative. - recommend outpatient follow up with Dr Lajoyce Corners.   Microcytic anemia FOBT negative. S/p PRBC transfusions this hospitalization.  Combination of iron def anemia and anemia of chronic disease.     Hypertension Well controlled.  Resume home meds.    Type 2 DM with hyperglycemia CBG (last 3)  Recent Labs    04/22/23 1122 04/22/23 1617  GLUCAP 103* 97   Resume SSI.  A1c IS 6.8%   AKI resolved.   Vomiting suspect from gastroparesis.  Currently on reglan , simethicone added.  Continue with PPI.  [place] abd showed liver cirrhosis with sludge in GB.  PT denies any abd pain.  Having bowel movements.    RN Pressure Injury Documentation: Pressure Injury 04/13/23 Perineum Bilateral Stage 2 -  Partial thickness loss of dermis presenting as a shallow open injury with a red, pink wound bed without slough. perineum/scrotum masd on admission (Active)  04/13/23 1100  Location: Perineum  Location Orientation: Bilateral   Staging: Stage 2 -  Partial thickness loss of dermis presenting as a shallow open injury with a red, pink wound bed without slough.  Wound Description (Comments): perineum/scrotum masd on admission  Present on Admission:   Dressing Type Moisture barrier 04/18/23 2345    Malnutrition Type:  Nutrition Problem: Moderate Malnutrition Etiology: chronic illness   Malnutrition Characteristics:  Signs/Symptoms: energy intake < or equal to 75% for > or equal to 1 month, moderate fat depletion, moderate muscle depletion   Nutrition Interventions:  Interventions: MVI, Magic cup  Estimated body mass index is 37.33 kg/m as calculated from the following:   Height as of this encounter: 6\' 4"  (1.93 m).   Weight as of this encounter: 139.1 kg.  Code Status: FULL CODE DVT Prophylaxis:  enoxaparin (LOVENOX) injection 40 mg Start: 04/15/23 2000 SCD's Start: 04/08/23 1658 SCDs Start: 04/07/23 0104   Level of Care: Level of care: Med-Surg Family Communication: none at bedside.  Disposition Plan:     Remains inpatient appropriate:   Procedures:  Status post right BKA 11/24   Consultants:   Orthopedics.   Antimicrobials:   Anti-infectives (From admission, onward)    Start  Dose/Rate Route Frequency Ordered Stop   04/09/23 1345  doxycycline (VIBRAMYCIN) 100 mg in dextrose 5 % 250 mL IVPB        100 mg 125 mL/hr over 120 Minutes Intravenous Every 12 hours 04/09/23 1252 04/09/23 1746   04/08/23 1300  levofloxacin (LEVAQUIN) IVPB 500 mg  Status:  Discontinued        500 mg 100 mL/hr over 60 Minutes Intravenous On call to O.R. 04/08/23 1229 04/08/23 1648   04/08/23 1300  vancomycin (VANCOREADY) IVPB 1500 mg/300 mL        1,500 mg 100 mL/hr over 180 Minutes Intravenous On call to O.R. 04/08/23 1229 04/08/23 1433   04/08/23 1237  vancomycin HCl (VANCOREADY) 1500 MG/300ML IVPB       Note to Pharmacy: [name] D: cabinet override      04/08/23 1237 04/08/23 1439   04/07/23 0800   piperacillin-tazobactam (ZOSYN) IVPB 3.375 g        3.375 g 12.5 mL/hr over 240 Minutes Intravenous Every 8 hours 04/07/23 0108 04/09/23 2108   04/07/23 0100  doxycycline (VIBRAMYCIN) 100 mg in dextrose 5 % 250 mL IVPB  Status:  Discontinued        100 mg 125 mL/hr over 120 Minutes Intravenous Every 12 hours 04/07/23 0057 04/09/23 1252   04/07/23 0000  ceFEPIme (MAXIPIME) 2 g in sodium chloride 0.9 % 100 mL IVPB  Status:  Discontinued        2 g 200 mL/hr over 30 Minutes Intravenous Every 8 hours 04/06/23 2349 04/07/23 0108   04/06/23 2330  metroNIDAZOLE (FLAGYL) IVPB 500 mg        500 mg 100 mL/hr over 60 Minutes Intravenous  Once 04/06/23 2321 04/07/23 0032   04/06/23 2300  piperacillin-tazobactam (ZOSYN) IVPB 3.375 g  Status:  Discontinued        3.375 g 100 mL/hr over 30 Minutes Intravenous  Once 04/06/23 2257 04/06/23 2320   04/06/23 2300  vancomycin (VANCOREADY) IVPB 2000 mg/400 mL  Status:  Discontinued        2,000 mg 200 mL/hr over 120 Minutes Intravenous  Once 04/06/23 2257 04/06/23 2312        Medications  Scheduled Meds:  (feeding supplement) PROSource Plus  30 mL Oral BID BM   vitamin C  1,000 mg Oral Daily   docusate sodium  100 mg Oral BID   enoxaparin (LOVENOX) injection  40 mg Subcutaneous Q24H   feeding supplement  237 mL Oral BID BM   folic acid  1 mg Oral Daily   Gerhardt's butt cream   Topical BID   insulin aspart  0-9 Units Subcutaneous TID WC   metoCLOPramide (REGLAN) injection  5 mg Intravenous Q6H   metoprolol tartrate  50 mg Oral BID   multivitamin with minerals  1 tablet Oral Daily   pantoprazole  40 mg Oral BID   saccharomyces boulardii  250 mg Oral BID   sodium chloride flush  10 mL Intravenous Q12H   sodium chloride flush  3 mL Intravenous Q12H   Continuous Infusions: PRN Meds:.acetaminophen, guaiFENesin-dextromethorphan, hydrALAZINE, labetalol, metoprolol tartrate, ondansetron **OR** ondansetron (ZOFRAN) IV, mouth rinse, oxyCODONE, oxyCODONE,  phenol, simethicone    Subjective:   Alan Tapia was seen and examined today.      Objective:   Vitals:   04/21/23 2136 04/22/23 0513 04/22/23 0857 04/22/23 1507  BP: (!) 148/79 (!) 143/84 (!) 150/91 (!) 141/79  Pulse: 99 98 (!) 107 85  Resp:  20 18 16  Temp:  98.5 F (36.9 C) 98.1 F (36.7 C) 98.7 F (37.1 C)  TempSrc:  Oral Oral Oral  SpO2:  97% 91% 96%  Weight:  (!) 139.1 kg    Height:        Intake/Output Summary (Last 24 hours) at 04/22/2023 1656 Last data filed at 04/22/2023 1125 Gross per 24 hour  Intake 480 ml  Output 1150 ml  Net -670 ml   Filed Weights   04/18/23 0500 04/20/23 0435 04/22/23 0513  Weight: (!) 143.5 kg (!) 144 kg (!) 139.1 kg     Exam General exam: Appears calm and comfortable  Respiratory system: Clear to auscultation. Respiratory effort normal. Cardiovascular system: S1 & S2 heard, RRR.  Gastrointestinal system: Abdomen is nondistended, soft and nontender.  Central nervous system: Alert and oriented. No focal neurological deficits. Extremities: bilateral BKA. Skin: No rashes, lesions or ulcers Psychiatry: Mood is appropriate.    Data Reviewed:  I have personally reviewed following labs and imaging studies   CBC Lab Results  Component Value Date   WBC 11.2 (H) 04/18/2023   RBC 3.37 (L) 04/18/2023   HGB 8.4 (L) 04/18/2023   HCT 27.7 (L) 04/18/2023   MCV 82.2 04/18/2023   MCH 24.9 (L) 04/18/2023   PLT 504 (H) 04/18/2023   MCHC 30.3 04/18/2023   RDW 22.8 (H) 04/18/2023   LYMPHSABS 1.5 04/06/2023   MONOABS 1.8 (H) 04/06/2023   EOSABS 0.0 04/06/2023   BASOSABS 0.0 04/06/2023     Last metabolic panel Lab Results  Component Value Date   NA 137 04/18/2023   K 4.0 04/18/2023   CL 107 04/18/2023   CO2 26 04/18/2023   BUN 13 04/18/2023   CREATININE 1.03 04/18/2023   GLUCOSE 93 04/18/2023   GFRNONAA >60 04/18/2023   GFRAA >60 09/20/2016   CALCIUM 7.6 (L) 04/18/2023   PROT 6.3 (L) 04/18/2023   ALBUMIN <1.5 (L)  04/18/2023   BILITOT 0.5 04/18/2023   ALKPHOS 202 (H) 04/18/2023   AST 15 04/18/2023   ALT 12 04/18/2023   ANIONGAP 4 (L) 04/18/2023    CBG (last 3)  Recent Labs    04/22/23 1122 04/22/23 1617  GLUCAP 103* 97      Coagulation Profile: No results for input(s): "INR", "PROTIME" in the last 168 hours.   Radiology Studies: No results found.     Kathlen Mody M.D. [facility] 04/22/2023, 4:56 PM  Available via Epic secure chat 7am-7pm After 7 pm, please refer to night coverage provider listed on amion.

## 2023-04-22 NOTE — TOC Progression Note (Signed)
Transition of Care (TOC) - Progression Note    Patient Details  Name: Alan Tapia. MRN: [id number] Date of Birth: Feb 23, 1976  Transition [facility]) CM/SW Contact  Carley Hammed, LCSW Phone Number: 04/22/2023, 11:39 AM  Clinical Narrative:    CSW spoke with Edrick Oh, the case manager handling the rental back pay for this pt and spouse. Per Onalee Hua, he has all of his paperwork he needs but notes the process can take a while. Onalee Hua and CSW discussed situation and Onalee Hua requested hospital documentation in order to expedite process, this was provided. Onalee Hua noted it was his understanding that the pt's landlord would be willing to reassess moving pt into an appropriate apartment once back pay had been received.  CSW spoke with pt's spouse who noted they already have a ramp to the house, but that would not be sufficient to make it accessible. Spouse notes she has a Section 8 voucher, and asks if it would make more sense to use this at another housing unit. CSW noted this would be a conversation to have with her housing case manager, but CSW would follow up as well to determine options.  Hale Bogus with DSS continues to follow for Disability to be approved. CSW noted to family that CSW had spoken to several facilities about taking pt when Disability is approved and the only facility agreeable to review at that time is [facility] due to pt's high cost of care. Family reported to nurse they may not be agreeable due to the distance to the facility.  At this time, the team continues to wait for back rent to be paid to reassess the housing situation or for disability to be approved for SNF, whichever becomes available. TOC will continue to follow.    Expected Discharge Plan: Skilled Nursing Facility Barriers to Discharge: Insurance Authorization, Continued Medical Work up, SNF Pending bed offer  Expected Discharge Plan and Services In-house Referral: Clinical Social Work     Living arrangements for  the past 2 months: Single Family Home Expected Discharge Date: 04/11/23                                     Social Determinants of Health (SDOH) Interventions SDOH Screenings   Food Insecurity: No Food Insecurity (04/07/2023)  Housing: Medium Risk (04/07/2023)  Transportation Needs: No Transportation Needs (04/07/2023)  Recent Concern: Transportation Needs - Unmet Transportation Needs (02/17/2023)   Received from [facility]  Utilities: At Risk (04/07/2023)  Alcohol Screen: Low Risk  (08/20/2022)  Financial Resource Strain: Patient Unable To Answer (08/20/2022)  Tobacco Use: Low Risk  (04/08/2023)    Readmission Risk Interventions    04/07/2023    2:34 PM  Readmission Risk Prevention Plan  Transportation Screening Complete  PCP or Specialist Appt within 3-5 Days Complete  HRI or Home Care Consult Complete  Social Work Consult for Recovery Care Planning/Counseling Complete  Palliative Care Screening Not Applicable  Medication Review Oceanographer) Complete

## 2023-04-22 NOTE — Plan of Care (Signed)
Pt taking Mylicon for gas but states is passing some gas.

## 2023-04-22 NOTE — Progress Notes (Signed)
Attempted to visualize buttocks at handoff with Cianna but pt did not want this at this time.

## 2023-04-23 DIAGNOSIS — D649 Anemia, unspecified: Secondary | ICD-10-CM | POA: Diagnosis not present

## 2023-04-23 DIAGNOSIS — A419 Sepsis, unspecified organism: Secondary | ICD-10-CM | POA: Diagnosis not present

## 2023-04-23 DIAGNOSIS — N182 Chronic kidney disease, stage 2 (mild): Secondary | ICD-10-CM | POA: Diagnosis not present

## 2023-04-23 DIAGNOSIS — E119 Type 2 diabetes mellitus without complications: Secondary | ICD-10-CM | POA: Diagnosis not present

## 2023-04-23 LAB — GLUCOSE, CAPILLARY
Glucose-Capillary: 84 mg/dL (ref 70–99)
Glucose-Capillary: 104 mg/dL — ABNORMAL HIGH (ref 70–99)
Glucose-Capillary: 111 mg/dL — ABNORMAL HIGH (ref 70–99)
Glucose-Capillary: 98 mg/dL (ref 70–99)

## 2023-04-23 NOTE — TOC Progression Note (Signed)
Transition of Care (TOC) - Progression Note    Patient Details  Name: Alan Tapia. MRN: [id number] Date of Birth: Feb 08, 1976  Transition [facility]) CM/SW Contact  Carley Hammed, LCSW Phone Number: 04/23/2023, 1:04 PM  Clinical Narrative:    CSW received an email from Edrick Oh that noted they have everything they need to process pt's application for rent back pay and will expedite the process. CSW spoke with pt's spouse who stated pt is now on her lease and with the back rent paid, they may be able to transition to an accessible apartment. Pt's spouse is looking into housing options now. Per spouse, pt and her do not want him to go to rehab, since they do not want to be separated.  CSW and CM began discussions about needs for a transition home including HH and DME. Copy of Lease provided to financial counseling who will forward to DSS caseworker. At this time, TOC will continue to follow for appropriate housing for DC.    Expected Discharge Plan: Skilled Nursing Facility Barriers to Discharge: Insurance Authorization, Continued Medical Work up, SNF Pending bed offer  Expected Discharge Plan and Services In-house Referral: Clinical Social Work     Living arrangements for the past 2 months: Single Family Home Expected Discharge Date: 04/11/23                                     Social Determinants of Health (SDOH) Interventions SDOH Screenings   Food Insecurity: No Food Insecurity (04/07/2023)  Housing: Medium Risk (04/07/2023)  Transportation Needs: No Transportation Needs (04/07/2023)  Recent Concern: Transportation Needs - Unmet Transportation Needs (02/17/2023)   Received from [facility]  Utilities: At Risk (04/07/2023)  Alcohol Screen: Low Risk  (08/20/2022)  Financial Resource Strain: Patient Unable To Answer (08/20/2022)  Tobacco Use: Low Risk  (04/08/2023)    Readmission Risk Interventions    04/07/2023    2:34 PM  Readmission Risk Prevention  Plan  Transportation Screening Complete  PCP or Specialist Appt within 3-5 Days Complete  HRI or Home Care Consult Complete  Social Work Consult for Recovery Care Planning/Counseling Complete  Palliative Care Screening Not Applicable  Medication Review Oceanographer) Complete

## 2023-04-23 NOTE — Progress Notes (Signed)
Physical Therapy Treatment Patient Details Name: Alan Tapia. MRN: [id number] DOB: 05-05-1976 Today's Date: 04/23/2023   History of Present Illness The pt is a 47 yo male presenting 11/25 with nausea, vomiting, and fever as well as R foot wound. 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    PT Comments  Received word that pt wanted his caregiver to be educated on how to use a lift to help him OOB. Educated using maxi-sky with emphasis on how home hoyer lift will be different (even pulled up pictures of hoyer lift to further demonstrate). By end of session, pt up in recliner with side-by-side air cushions under him. Nurse tech aware and verbalizes understanding of how to use lift to return pt to bed.     If plan is discharge home, recommend the following: Two people to help with walking and/or transfers;Assist for transportation;Assistance with cooking/housework;Help with stairs or ramp for entrance   Can travel by private vehicle     No  Equipment Recommendations  Hoyer lift;Wheelchair cushion (measurements PT);Wheelchair (measurements PT)    Recommendations for Other Services       Precautions / Restrictions Precautions Precautions: Fall;Other (comment) Precaution Comments: RLE limb protector Restrictions Weight Bearing Restrictions Per Provider Order: Yes Other Position/Activity Restrictions: bilateral BKA     Mobility  Bed Mobility Overal bed mobility: Needs Assistance Bed Mobility: Rolling Rolling: Min assist         General bed mobility comments: rolling and educated pt's caregiver on how to place pad; explained different type of pad for hoyer lift (as he would have at home)    Transfers                   General transfer comment: lifted with leg straps crossed per pt request (and pillow  case tucked between scrotuma nd  thighs) Transfer via Lift Equipment: Maxisky  Ambulation/Gait               General Gait Details: unable   Optometrist     Tilt Bed    Modified Rankin (Stroke Patients Only)       Balance                                            Cognition Arousal: Alert Behavior During Therapy: WFL for tasks assessed/performed Overall Cognitive Status: Within Functional Limits for tasks assessed                                          Exercises      General Comments        Pertinent Vitals/Pain Pain Assessment Pain Assessment: Faces Faces Pain Scale: No hurt Breathing: normal    Home Living                          Prior Function            PT Goals (current goals can now be found in the care plan section) Acute Rehab  PT Goals Patient Stated Goal: get bil prosthetics PT Goal Formulation: With patient Time For Goal Achievement: 04/24/23 Potential to Achieve Goals: Fair Progress towards PT goals: Progressing toward goals    Frequency    Min 1X/week      PT Plan      Co-evaluation              AM-PAC PT "6 Clicks" Mobility   Outcome Measure  Help needed turning from your back to your side while in a flat bed without using bedrails?: A Lot Help needed moving from lying on your back to sitting on the side of a flat bed without using bedrails?: A Lot Help needed moving to and from a bed to a chair (including a wheelchair)?: Total Help needed standing up from a chair using your arms (e.g., wheelchair or bedside chair)?: Total Help needed to walk in hospital room?: Total Help needed climbing 3-5 steps with a railing? : Total 6 Click Score: 8    End of Session   Activity Tolerance: No increased pain Patient left: with call bell/phone within reach;with family/visitor present;in chair Nurse Communication: Mobility status;Need for lift  equipment;Other (comment) (pt prefers leg straps crossed) PT Visit Diagnosis: Other abnormalities of gait and mobility (R26.89);Muscle weakness (generalized) (M62.81);Pain Pain - Right/Left:  (abdomen) Pain - part of body:  (abdomen)     Time: 1191-4782 PT Time Calculation (min) (ACUTE ONLY): 30 min  Charges:    $Therapeutic Activity: 8-22 mins $Self Care/Home Management: 8-22 PT General Charges $$ ACUTE PT VISIT: 1 Visit                      Alan Tapia, PT Acute Rehabilitation Services  Office [phone]    Alan Tapia 04/23/2023, 2:48 PM

## 2023-04-23 NOTE — Progress Notes (Signed)
[facility]                                                                               Alan Tapia, is a 47 y.o. 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 He was also having nausea, vomiting and diarrhea. Orthopedic surgery was consulted and patient underwent right BKA 11/27 by Dr. Lajoyce Corners.  No new events overnight.     Assessment & Plan    Assessment and Plan:  Sepsis sec to Non healing right foot ulcer Previous history of left BKA -Status post right BKA 11/24 -Blood cultures negative. - recommend outpatient follow up with Dr Lajoyce Corners.   Microcytic anemia FOBT negative. S/p PRBC transfusions this hospitalization.  Combination of iron def anemia and anemia of chronic disease.     Hypertension Optimal BP parameters.  Resume home meds.    Type 2 DM with hyperglycemia CBG (last 3)  Recent Labs    04/22/23 2136 04/23/23 0817 04/23/23 1146  GLUCAP 85 84 104*   Resume SSI.  A1c IS 6.8%   AKI resolved.   Vomiting suspect from gastroparesis.  Currently on reglan , simethicone added.  Continue with PPI.  [place] abd showed liver cirrhosis with sludge in GB.  PT denies any abd pain.  Having bowel movements.    Anemia of chronic disease:  Hemoglobin around 8 and stable.     RN Pressure Injury Documentation: Pressure Injury 04/13/23 Perineum Bilateral Stage 2 -  Partial thickness loss of dermis presenting as a shallow open injury with a red, pink wound bed without slough.  perineum/scrotum masd on admission (Active)  04/13/23 1100  Location: Perineum  Location Orientation: Bilateral  Staging: Stage 2 -  Partial thickness loss of dermis presenting as a shallow open injury with a red, pink wound bed without slough.  Wound Description (Comments): perineum/scrotum masd on admission  Present on Admission:   Dressing Type None 04/22/23 2130    Malnutrition Type:  Nutrition Problem: Moderate Malnutrition Etiology: chronic illness   Malnutrition Characteristics:  Signs/Symptoms: energy intake < or equal to 75% for > or equal to 1 month, moderate fat depletion, moderate muscle depletion   Nutrition Interventions:  Interventions: MVI, Magic cup  Estimated body mass index is 37.33 kg/m as calculated from the following:   Height as of this encounter: 6\' 4"  (1.93 m).   Weight as of this encounter: 139.1 kg.  Code Status: FULL CODE DVT Prophylaxis:  enoxaparin (LOVENOX) injection 40 mg Start: 04/15/23 2000 SCD's Start: 04/08/23 1658 SCDs Start: 04/07/23 0104   Level of Care: Level of care: Med-Surg Family Communication: none at bedside.  Disposition Plan:     Remains inpatient appropriate: awaiting safe disposition.   Procedures:  Status post right BKA 11/24   Consultants:   Orthopedics.   Antimicrobials:   Anti-infectives (From admission, onward)    Start     Dose/Rate Route Frequency Ordered Stop   04/09/23 1345  doxycycline (VIBRAMYCIN) 100 mg in dextrose 5 % 250 mL IVPB        100 mg 125 mL/hr over 120 Minutes Intravenous Every 12 hours 04/09/23 1252 04/09/23 1746   04/08/23 1300  levofloxacin (LEVAQUIN) IVPB 500 mg  Status:  Discontinued        500 mg 100 mL/hr over 60 Minutes Intravenous On call to O.R. 04/08/23 1229 04/08/23 1648   04/08/23 1300  vancomycin (VANCOREADY) IVPB 1500 mg/300 mL        1,500 mg 100 mL/hr over 180 Minutes Intravenous On call to O.R. 04/08/23 1229 04/08/23 1433   04/08/23 1237  vancomycin HCl (VANCOREADY)  1500 MG/300ML IVPB       Note to Pharmacy: [name] D: cabinet override      04/08/23 1237 04/08/23 1439   04/07/23 0800  piperacillin-tazobactam (ZOSYN) IVPB 3.375 g        3.375 g 12.5 mL/hr over 240 Minutes Intravenous Every 8 hours 04/07/23 0108 04/09/23 2108   04/07/23 0100  doxycycline (VIBRAMYCIN) 100 mg in dextrose 5 % 250 mL IVPB  Status:  Discontinued        100 mg 125 mL/hr over 120 Minutes Intravenous Every 12 hours 04/07/23 0057 04/09/23 1252   04/07/23 0000  ceFEPIme (MAXIPIME) 2 g in sodium chloride 0.9 % 100 mL IVPB  Status:  Discontinued        2 g 200 mL/hr over 30 Minutes Intravenous Every 8 hours 04/06/23 2349 04/07/23 0108   04/06/23 2330  metroNIDAZOLE (FLAGYL) IVPB 500 mg        500 mg 100 mL/hr over 60 Minutes Intravenous  Once 04/06/23 2321 04/07/23 0032   04/06/23 2300  piperacillin-tazobactam (ZOSYN) IVPB 3.375 g  Status:  Discontinued        3.375 g 100 mL/hr over 30 Minutes Intravenous  Once 04/06/23 2257 04/06/23 2320   04/06/23 2300  vancomycin (VANCOREADY) IVPB 2000 mg/400 mL  Status:  Discontinued        2,000 mg 200 mL/hr over 120 Minutes Intravenous  Once 04/06/23 2257 04/06/23 2312        Medications  Scheduled Meds:  (feeding supplement) PROSource Plus  30 mL Oral BID BM   vitamin C  1,000 mg Oral Daily   docusate sodium  100 mg Oral BID   enoxaparin (LOVENOX) injection  40 mg Subcutaneous Q24H   feeding supplement  237 mL Oral BID BM   folic acid  1 mg Oral Daily   Gerhardt's butt cream   Topical BID   insulin aspart  0-9 Units Subcutaneous TID WC   metoCLOPramide (REGLAN) injection  5 mg Intravenous Q6H   metoprolol tartrate  50 mg Oral BID   multivitamin with minerals  1 tablet Oral Daily   pantoprazole  40 mg Oral BID   saccharomyces boulardii  250 mg Oral BID   sodium chloride flush  10 mL Intravenous Q12H   sodium chloride flush  3 mL Intravenous Q12H   Continuous Infusions: PRN Meds:.acetaminophen,  guaiFENesin-dextromethorphan, hydrALAZINE, labetalol, metoprolol tartrate, ondansetron **OR** ondansetron (ZOFRAN) IV, mouth rinse, oxyCODONE, oxyCODONE, phenol, simethicone    Subjective:   Alan Tapia was seen and examined today.    No new complaints.   Objective:   Vitals:  04/22/23 0857 04/22/23 1507 04/22/23 2134 04/23/23 0527  BP: (!) 150/91 (!) 141/79 (!) 153/87 (!) 150/85  Pulse: (!) 107 85 (!) 104 89  Resp: 18 16 18 16   Temp: 98.1 F (36.7 C) 98.7 F (37.1 C) 97.6 F (36.4 C) 98.3 F (36.8 C)  TempSrc: Oral Oral Oral Oral  SpO2: 91% 96% 97% 94%  Weight:      Height:        Intake/Output Summary (Last 24 hours) at 04/23/2023 1423 Last data filed at 04/23/2023 0900 Gross per 24 hour  Intake 360 ml  Output 500 ml  Net -140 ml   Filed Weights   04/18/23 0500 04/20/23 0435 04/22/23 0513  Weight: (!) 143.5 kg (!) 144 kg (!) 139.1 kg     Exam General exam: Appears calm and comfortable  Respiratory system: Clear to auscultation. Respiratory effort normal. Cardiovascular system: S1 & S2 heard, RRR. Gastrointestinal system: Abdomen is soft NT  Central nervous system: Alert and oriented. Extremities: Edema, no cyanosis.  Skin: No rashes,  Psychiatry:  Mood & affect appropriate.    Data Reviewed:  I have personally reviewed following labs and imaging studies   CBC Lab Results  Component Value Date   WBC 11.2 (H) 04/18/2023   RBC 3.37 (L) 04/18/2023   HGB 8.4 (L) 04/18/2023   HCT 27.7 (L) 04/18/2023   MCV 82.2 04/18/2023   MCH 24.9 (L) 04/18/2023   PLT 504 (H) 04/18/2023   MCHC 30.3 04/18/2023   RDW 22.8 (H) 04/18/2023   LYMPHSABS 1.5 04/06/2023   MONOABS 1.8 (H) 04/06/2023   EOSABS 0.0 04/06/2023   BASOSABS 0.0 04/06/2023     Last metabolic panel Lab Results  Component Value Date   NA 137 04/18/2023   K 4.0 04/18/2023   CL 107 04/18/2023   CO2 26 04/18/2023   BUN 13 04/18/2023   CREATININE 1.03 04/18/2023   GLUCOSE 93 04/18/2023    GFRNONAA >60 04/18/2023   GFRAA >60 09/20/2016   CALCIUM 7.6 (L) 04/18/2023   PROT 6.3 (L) 04/18/2023   ALBUMIN <1.5 (L) 04/18/2023   BILITOT 0.5 04/18/2023   ALKPHOS 202 (H) 04/18/2023   AST 15 04/18/2023   ALT 12 04/18/2023   ANIONGAP 4 (L) 04/18/2023    CBG (last 3)  Recent Labs    04/22/23 2136 04/23/23 0817 04/23/23 1146  GLUCAP 85 84 104*      Coagulation Profile: No results for input(s): "INR", "PROTIME" in the last 168 hours.   Radiology Studies: No results found.     Kathlen Mody M.D. [facility] 04/23/2023, 2:23 PM  Available via Epic secure chat 7am-7pm After 7 pm, please refer to night coverage provider listed on amion.

## 2023-04-23 NOTE — Plan of Care (Signed)
   Problem: Respiratory: Goal: Ability to maintain adequate ventilation will improve Outcome: Progressing   Problem: Education: Goal: Knowledge of General Education information will improve Description: Including pain rating scale, medication(s)/side effects and non-pharmacologic comfort measures Outcome: Progressing   Problem: Coping: Goal: Level of anxiety will decrease Outcome: Progressing

## 2023-04-24 DIAGNOSIS — A419 Sepsis, unspecified organism: Secondary | ICD-10-CM | POA: Diagnosis not present

## 2023-04-24 DIAGNOSIS — I10 Essential (primary) hypertension: Secondary | ICD-10-CM | POA: Diagnosis not present

## 2023-04-24 DIAGNOSIS — E119 Type 2 diabetes mellitus without complications: Secondary | ICD-10-CM | POA: Diagnosis not present

## 2023-04-24 DIAGNOSIS — Z89512 Acquired absence of left leg below knee: Secondary | ICD-10-CM | POA: Diagnosis not present

## 2023-04-24 LAB — GLUCOSE, CAPILLARY
Glucose-Capillary: 84 mg/dL (ref 70–99)
Glucose-Capillary: 76 mg/dL (ref 70–99)
Glucose-Capillary: 77 mg/dL (ref 70–99)
Glucose-Capillary: 94 mg/dL (ref 70–99)

## 2023-04-24 MED ORDER — POLYETHYLENE GLYCOL 3350 17 G PO PACK
17.0000 g | PACK | Freq: Every day | ORAL | Status: AC | PRN
Start: 2023-04-24 — End: 2023-05-09
  Administered 2023-04-24 – 2023-05-09 (×2): 17 g via ORAL
  Filled 2023-04-24 (×2): qty 1

## 2023-04-24 NOTE — Progress Notes (Signed)
Notified provider of constipation

## 2023-04-24 NOTE — TOC Progression Note (Signed)
Transition of Care (TOC) - Progression Note    Patient Details  Name: Alan Tapia. MRN: [id number] Date of Birth: 08/11/1975  Transition of Care [facility]) CM/SW Contact  Carley Hammed, LCSW Phone Number: 04/24/2023, 12:47 PM  Clinical Narrative:    CSW advised by MD that pt is stating he wants to go to rehab. Per previous conversations with pt and spouse, this is not an option until his disability is approved. CSW followed up with Hale Bogus with [employer] who noted they are continuing to try to expedite disability approval. CSW updated Hale Bogus that Onalee Hua with [facility] is working on getting back rent paid which will assist in the apartment hunt. TOC and DSS continue to work on disposition.    Expected Discharge Plan: Skilled Nursing Facility Barriers to Discharge: Insurance Authorization, Continued Medical Work up, SNF Pending bed offer  Expected Discharge Plan and Services In-house Referral: Clinical Social Work     Living arrangements for the past 2 months: Single Family Home Expected Discharge Date: 04/11/23                                     Social Determinants of Health (SDOH) Interventions SDOH Screenings   Food Insecurity: No Food Insecurity (04/07/2023)  Housing: Unknown (04/23/2023)  Recent Concern: Housing - Medium Risk (04/07/2023)  Transportation Needs: No Transportation Needs (04/07/2023)  Recent Concern: Transportation Needs - Unmet Transportation Needs (02/17/2023)   Received from [facility]  Utilities: At Risk (04/07/2023)  Alcohol Screen: Low Risk  (08/20/2022)  Financial Resource Strain: Patient Unable To Answer (08/20/2022)  Tobacco Use: Low Risk  (04/08/2023)    Readmission Risk Interventions    04/07/2023    2:34 PM  Readmission Risk Prevention Plan  Transportation Screening Complete  PCP or Specialist Appt within 3-5 Days Complete  HRI or Home Care Consult Complete  Social Work Consult for Recovery Care Planning/Counseling Complete   Palliative Care Screening Not Applicable  Medication Review Oceanographer) Complete

## 2023-04-24 NOTE — Progress Notes (Signed)
Occupational Therapy Treatment Patient Details Name: Alan Tapia. MRN: [id number] DOB: 1976-02-06 Today's Date: 04/24/2023   History of present illness The pt is a 47 yo male presenting 11/25 with nausea, vomiting, and fever as well as R foot wound. 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   OT comments  Patient received in bed and stating he did not feel well due to having a "bad night" but was willing to participate with OT. Patient was min assist and increased time to get to EOB with use of bed features. Once on EOB patient was found to have soiled bed. Patient returned to supine with increased time and min assist. Patient able to roll to side to allow for cleaning. Discussed transfers using hoyer lift and pads. Patient states he would like to attempt slide board transfers or A-P transfers next visit. Patient will benefit from continued inpatient follow up therapy, <3 hours/day with acute OT to continue to follow.       If plan is discharge home, recommend the following:  Two people to help with walking and/or transfers;Two people to help with bathing/dressing/bathroom;Assistance with cooking/housework;Assist for transportation;Help with stairs or ramp for entrance   Equipment Recommendations  Hoyer lift    Recommendations for Other Services      Precautions / Restrictions Precautions Precautions: Fall;Other (comment) Precaution Comments: RLE limb protector Restrictions Weight Bearing Restrictions Per Provider Order: Yes RLE Weight Bearing Per Provider Order: Non weight bearing Other Position/Activity Restrictions: bilateral BKA       Mobility Bed Mobility Overal bed mobility: Needs Assistance Bed Mobility: Rolling, Supine to Sit, Sit to Supine Rolling: Min assist   Supine to sit: Min assist, HOB elevated,  Used rails Sit to supine: Min assist, Used rails   General bed mobility comments: increased time and with use of bed pad to assist    Transfers                         Balance Overall balance assessment: Needs assistance Sitting-balance support: Bilateral upper extremity supported, Feet unsupported Sitting balance-Leahy Scale: Fair Sitting balance - Comments: EOB                                   ADL either performed or assessed with clinical judgement   ADL Overall ADL's : Needs assistance/impaired             Lower Body Bathing: Maximal assistance;Bed level Lower Body Bathing Details (indicate cue type and reason): patient required assistance with cleaning following have had soiled bed                            Extremity/Trunk Assessment              Vision       Perception     Praxis      Cognition Arousal: Alert Behavior During Therapy: WFL for tasks assessed/performed Overall Cognitive Status: Within Functional Limits for tasks assessed                                 General Comments: Patient stating he  did not feel well, "I had a bad night"        Exercises      Shoulder Instructions       General Comments      Pertinent Vitals/ Pain       Pain Assessment Pain Assessment: Faces Faces Pain Scale: Hurts little more Pain Location: bottom with scooting on EOB Pain Descriptors / Indicators: Grimacing, Discomfort Pain Intervention(s): Limited activity within patient's tolerance, Monitored during session, Repositioned  Home Living                                          Prior Functioning/Environment              Frequency  Min 1X/week        Progress Toward Goals  OT Goals(current goals can now be found in the care plan section)  Progress towards OT goals: Progressing toward goals  Acute Rehab OT Goals Patient Stated Goal: go home OT Goal Formulation: With  patient Time For Goal Achievement: 04/24/23 Potential to Achieve Goals: Good ADL Goals Pt Will Perform Grooming: with supervision;sitting Pt Will Perform Upper Body Bathing: with contact guard assist;sitting Pt Will Perform Upper Body Dressing: with modified independence;sitting Pt Will Transfer to Toilet: with mod assist;with transfer board Pt Will Perform Toileting - Clothing Manipulation and hygiene: with min assist;sitting/lateral leans Pt/caregiver will Perform Home Exercise Program: Increased strength;Both right and left upper extremity;With theraband;With theraputty;Independently;With written HEP provided  Plan      Co-evaluation                 AM-PAC OT "6 Clicks" Daily Activity     Outcome Measure   Help from another person eating meals?: A Little Help from another person taking care of personal grooming?: A Little Help from another person toileting, which includes using toliet, bedpan, or urinal?: Total Help from another person bathing (including washing, rinsing, drying)?: A Lot Help from another person to put on and taking off regular upper body clothing?: A Little Help from another person to put on and taking off regular lower body clothing?: Total 6 Click Score: 13    End of Session    OT Visit Diagnosis: Other abnormalities of gait and mobility (R26.89);Muscle weakness (generalized) (M62.81);Other (comment)   Activity Tolerance Patient limited by pain   Patient Left in bed;with call bell/phone within reach;with bed alarm set;with family/visitor present   Nurse Communication Mobility status        Time: 4696-2952 OT Time Calculation (min): 28 min  Charges: OT General Charges $OT Visit: 1 Visit OT Treatments $Self Care/Home Management : 8-22 mins $Therapeutic Activity: 8-22 mins  Alfonse Flavors, OTA Acute Rehabilitation Services  Office [phone]   Dewain Penning 04/24/2023, 3:02 PM

## 2023-04-24 NOTE — Plan of Care (Signed)
  Problem: Fluid Volume: Goal: Hemodynamic stability will improve Outcome: Progressing   Problem: Clinical Measurements: Goal: Cardiovascular complication will be avoided Outcome: Progressing   Problem: Clinical Measurements: Goal: Diagnostic test results will improve Outcome: Progressing   Problem: Clinical Measurements: Goal: Will remain free from infection Outcome: Progressing   Problem: Coping: Goal: Level of anxiety will decrease Outcome: Progressing

## 2023-04-24 NOTE — Progress Notes (Signed)
[facility]                                                                               Jerrold Whitmarsh, is a 47 y.o. 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 He was also having nausea, vomiting and diarrhea. Orthopedic surgery was consulted and patient underwent right BKA 11/27 by Dr. Lajoyce Corners.  No new complaints. TOC On board to help with     Assessment & Plan    Assessment and Plan:  Sepsis sec to Non healing right foot ulcer Previous history of left BKA -Status post right BKA 11/24 -Blood cultures negative. - recommend outpatient follow up with Dr Lajoyce Corners.   Microcytic anemia FOBT negative. S/p PRBC transfusions this hospitalization.  Combination of iron def anemia and anemia of chronic disease.     Hypertension Well controlled.  Resume home meds.    Type 2 DM with hyperglycemia CBG (last 3)  Recent Labs    04/23/23 2128 04/24/23 0728 04/24/23 1140  GLUCAP 98 84 76   Resume SSI.  A1c IS 6.8% no changes in meds.    AKI resolved.   Vomiting suspect from gastroparesis.  Currently on reglan , simethicone added.  Continue with PPI.  [place] abd showed liver cirrhosis with sludge in GB.  PT denies any abd pain.  Having bowel movements.  No new complaints.    Anemia of chronic disease:  Hemoglobin around 8 and stable.     RN Pressure Injury Documentation: Pressure Injury 04/13/23 Perineum Bilateral Stage 2 -  Partial thickness loss of dermis presenting as a shallow  open injury with a red, pink wound bed without slough. perineum/scrotum masd on admission (Active)  04/13/23 1100  Location: Perineum  Location Orientation: Bilateral  Staging: Stage 2 -  Partial thickness loss of dermis presenting as a shallow open injury with a red, pink wound bed without slough.  Wound Description (Comments): perineum/scrotum masd on admission  Present on Admission:   Dressing Type Negative pressure wound therapy 04/23/23 2000    Malnutrition Type:  Nutrition Problem: Moderate Malnutrition Etiology: chronic illness   Malnutrition Characteristics:  Signs/Symptoms: energy intake < or equal to 75% for > or equal to 1 month, moderate fat depletion, moderate muscle depletion   Nutrition Interventions:  Interventions: MVI, Magic cup  Estimated body mass index is 37.68 kg/m as calculated from the following:   Height as of this encounter: 6\' 4"  (1.93 m).   Weight as of this encounter: 140.4 kg.  Code Status: FULL CODE DVT Prophylaxis:  enoxaparin (LOVENOX) injection 40 mg Start: 04/15/23 2000 SCD's Start: 04/08/23 1658 SCDs Start: 04/07/23 0104   Level of Care: Level of care: Med-Surg Family Communication: none at bedside.  Disposition Plan:     Remains inpatient appropriate: awaiting safe disposition.   Procedures:  Status post right BKA 11/24   Consultants:   Orthopedics.   Antimicrobials:   Anti-infectives (From admission, onward)    Start     Dose/Rate Route Frequency Ordered Stop   04/09/23 1345  doxycycline (VIBRAMYCIN) 100 mg in dextrose 5 % 250 mL IVPB        100 mg 125 mL/hr over 120 Minutes Intravenous Every 12 hours 04/09/23 1252 04/09/23 1746   04/08/23 1300  levofloxacin (LEVAQUIN) IVPB 500 mg  Status:  Discontinued        500 mg 100 mL/hr over 60 Minutes Intravenous On call to O.R. 04/08/23 1229 04/08/23 1648   04/08/23 1300  vancomycin (VANCOREADY) IVPB 1500 mg/300 mL        1,500 mg 100 mL/hr over 180 Minutes Intravenous On call  to O.R. 04/08/23 1229 04/08/23 1433   04/08/23 1237  vancomycin HCl (VANCOREADY) 1500 MG/300ML IVPB       Note to Pharmacy: [name] D: cabinet override      04/08/23 1237 04/08/23 1439   04/07/23 0800  piperacillin-tazobactam (ZOSYN) IVPB 3.375 g        3.375 g 12.5 mL/hr over 240 Minutes Intravenous Every 8 hours 04/07/23 0108 04/09/23 2108   04/07/23 0100  doxycycline (VIBRAMYCIN) 100 mg in dextrose 5 % 250 mL IVPB  Status:  Discontinued        100 mg 125 mL/hr over 120 Minutes Intravenous Every 12 hours 04/07/23 0057 04/09/23 1252   04/07/23 0000  ceFEPIme (MAXIPIME) 2 g in sodium chloride 0.9 % 100 mL IVPB  Status:  Discontinued        2 g 200 mL/hr over 30 Minutes Intravenous Every 8 hours 04/06/23 2349 04/07/23 0108   04/06/23 2330  metroNIDAZOLE (FLAGYL) IVPB 500 mg        500 mg 100 mL/hr over 60 Minutes Intravenous  Once 04/06/23 2321 04/07/23 0032   04/06/23 2300  piperacillin-tazobactam (ZOSYN) IVPB 3.375 g  Status:  Discontinued        3.375 g 100 mL/hr over 30 Minutes Intravenous  Once 04/06/23 2257 04/06/23 2320   04/06/23 2300  vancomycin (VANCOREADY) IVPB 2000 mg/400 mL  Status:  Discontinued        2,000 mg 200 mL/hr over 120 Minutes Intravenous  Once 04/06/23 2257 04/06/23 2312        Medications  Scheduled Meds:  (feeding supplement) PROSource Plus  30 mL Oral BID BM   vitamin C  1,000 mg Oral Daily   docusate sodium  100 mg Oral BID   enoxaparin (LOVENOX) injection  40 mg Subcutaneous Q24H   feeding supplement  237 mL Oral BID BM   folic acid  1 mg Oral Daily   Gerhardt's butt cream   Topical BID   insulin aspart  0-9 Units Subcutaneous TID WC   metoCLOPramide (REGLAN) injection  5 mg Intravenous Q6H   metoprolol tartrate  50 mg Oral BID   multivitamin with minerals  1 tablet Oral Daily   pantoprazole  40 mg Oral BID   saccharomyces boulardii  250 mg Oral BID   sodium chloride flush  10 mL Intravenous Q12H   sodium chloride flush  3 mL  Intravenous Q12H   Continuous Infusions: PRN Meds:.acetaminophen, guaiFENesin-dextromethorphan, hydrALAZINE, labetalol, metoprolol tartrate, ondansetron **OR** ondansetron (ZOFRAN) IV, mouth rinse, oxyCODONE, oxyCODONE, phenol    Subjective:   Clarisa Kindred was seen and examined  today.   No new complaints.   Objective:   Vitals:   04/23/23 1517 04/23/23 1956 04/24/23 0535 04/24/23 0730  BP: (!) 152/84 (!) 150/78 135/78 (!) 146/85  Pulse: 90 97 92 90  Resp: 18 20 20 17   Temp: 98.2 F (36.8 C) 98.2 F (36.8 C) 97.7 F (36.5 C) 98 F (36.7 C)  TempSrc: Oral Oral Oral Oral  SpO2: 99% 99% 98% 94%  Weight:   (!) 140.4 kg   Height:        Intake/Output Summary (Last 24 hours) at 04/24/2023 1241 Last data filed at 04/24/2023 0548 Gross per 24 hour  Intake 1280 ml  Output --  Net 1280 ml   Filed Weights   04/20/23 0435 04/22/23 0513 04/24/23 0535  Weight: (!) 144 kg (!) 139.1 kg (!) 140.4 kg     Exam General exam: Appears calm and comfortable  Respiratory system: Clear to auscultation. Respiratory effort normal. Cardiovascular system: S1 & S2 heard, RRR. Gastrointestinal system: Abdomen is nondistended, soft and nontender.  Central nervous system: Alert and oriented.  Extremities: BKA.BILATERAL.  Skin: No rashes,  Psychiatry: Mood & affect appropriate.   Data Reviewed:  I have personally reviewed following labs and imaging studies   CBC Lab Results  Component Value Date   WBC 11.2 (H) 04/18/2023   RBC 3.37 (L) 04/18/2023   HGB 8.4 (L) 04/18/2023   HCT 27.7 (L) 04/18/2023   MCV 82.2 04/18/2023   MCH 24.9 (L) 04/18/2023   PLT 504 (H) 04/18/2023   MCHC 30.3 04/18/2023   RDW 22.8 (H) 04/18/2023   LYMPHSABS 1.5 04/06/2023   MONOABS 1.8 (H) 04/06/2023   EOSABS 0.0 04/06/2023   BASOSABS 0.0 04/06/2023     Last metabolic panel Lab Results  Component Value Date   NA 137 04/18/2023   K 4.0 04/18/2023   CL 107 04/18/2023   CO2 26 04/18/2023   BUN 13  04/18/2023   CREATININE 1.03 04/18/2023   GLUCOSE 93 04/18/2023   GFRNONAA >60 04/18/2023   GFRAA >60 09/20/2016   CALCIUM 7.6 (L) 04/18/2023   PROT 6.3 (L) 04/18/2023   ALBUMIN <1.5 (L) 04/18/2023   BILITOT 0.5 04/18/2023   ALKPHOS 202 (H) 04/18/2023   AST 15 04/18/2023   ALT 12 04/18/2023   ANIONGAP 4 (L) 04/18/2023    CBG (last 3)  Recent Labs    04/23/23 2128 04/24/23 0728 04/24/23 1140  GLUCAP 98 84 76      Coagulation Profile: No results for input(s): "INR", "PROTIME" in the last 168 hours.   Radiology Studies: No results found.     Kathlen Mody M.D. [facility] 04/24/2023, 12:41 PM  Available via Epic secure chat 7am-7pm After 7 pm, please refer to night coverage provider listed on amion.

## 2023-04-25 DIAGNOSIS — E119 Type 2 diabetes mellitus without complications: Secondary | ICD-10-CM | POA: Diagnosis not present

## 2023-04-25 DIAGNOSIS — A419 Sepsis, unspecified organism: Secondary | ICD-10-CM | POA: Diagnosis not present

## 2023-04-25 DIAGNOSIS — Z89512 Acquired absence of left leg below knee: Secondary | ICD-10-CM | POA: Diagnosis not present

## 2023-04-25 DIAGNOSIS — I10 Essential (primary) hypertension: Secondary | ICD-10-CM | POA: Diagnosis not present

## 2023-04-25 LAB — GLUCOSE, CAPILLARY
Glucose-Capillary: 76 mg/dL (ref 70–99)
Glucose-Capillary: 76 mg/dL (ref 70–99)
Glucose-Capillary: 65 mg/dL — ABNORMAL LOW (ref 70–99)
Glucose-Capillary: 96 mg/dL (ref 70–99)

## 2023-04-25 NOTE — Progress Notes (Signed)
[facility]                                                                               Kanard Fors, is a 47 y.o. male, DOB - Oct 19, 1975, [id number] Admit date - 04/06/2023    Outpatient Primary MD for the patient is Patient, No Pcp Per  LOS - 18  days    Brief summary    47 year old male with past medical history significant for left lower extremity BKA, dilated cardiomyopathy, history of combined systolic and diastolic heart failure with EF 35 to 40%, hypertension, insulin-dependent diabetes, peripheral neuropathy, hyperlipidemia who presented to the emergency department on 04/06/2023 with complaints of odor, wound of his foot. He was also having nausea, vomiting and diarrhea. Orthopedic surgery was consulted and patient underwent right BKA 11/27 by Dr. Lajoyce Corners.  No new complaints. TOC On board to help with disposition.  Patient's wife is trying arrange housing after which [facility] will be ordered.     Assessment & Plan    Assessment and Plan:  Sepsis sec to Non healing right foot ulcer Previous history of left BKA -Status post right BKA 11/24 -Blood cultures negative. - recommend outpatient follow up with Dr Lajoyce Corners. Pt remains afebrile and minimal leukocytosis.    Microcytic anemia FOBT negative. S/p PRBC transfusions this hospitalization.  Combination of iron def anemia and anemia of chronic disease.     Hypertension Optimal BP parameters today.  Resume home meds.    Type 2 DM with hyperglycemia CBG (last 3)  Recent Labs    04/24/23 2218 04/25/23 0741 04/25/23 1123  GLUCAP 94 76 76   Resume SSI.  A1c IS 6.8% no changes in meds.    AKI resolved.   Vomiting suspect from gastroparesis.  Currently on reglan , simethicone added.  Continue with PPI.  [place] abd showed liver cirrhosis with sludge in GB.  PT denies any abd pain.  Having bowel movements.  No new complaints.    Anemia of chronic disease:  Hemoglobin around 8 and stable.      RN Pressure Injury Documentation: Pressure Injury 04/13/23 Perineum Bilateral Stage 2 -  Partial thickness loss of dermis presenting as a shallow open injury with a red, pink wound bed without slough. perineum/scrotum masd on admission (Active)  04/13/23 1100  Location: Perineum  Location Orientation: Bilateral  Staging: Stage 2 -  Partial thickness loss of dermis presenting as a shallow open injury with a red, pink wound bed without slough.  Wound Description (Comments): perineum/scrotum masd on admission  Present on Admission:   Dressing Type Negative pressure wound therapy 04/24/23 1300    Malnutrition Type:  Nutrition Problem: Moderate Malnutrition Etiology: chronic illness   Malnutrition Characteristics:  Signs/Symptoms: energy intake < or equal to 75% for > or equal to 1 month, moderate fat depletion, moderate muscle depletion   Nutrition Interventions:  Interventions: MVI, Magic cup  Estimated body mass index is 36.82 kg/m as calculated from the following:   Height as of this encounter: 6\' 4"  (1.93 m).   Weight as of this encounter: 137.2 kg.  Code Status: FULL CODE DVT Prophylaxis:  enoxaparin (LOVENOX) injection 40 mg Start: 04/15/23 2000  SCD's Start: 04/08/23 1658 SCDs Start: 04/07/23 0104   Level of Care: Level of care: Med-Surg Family Communication: none at bedside.  Disposition Plan:     Remains inpatient appropriate: awaiting safe disposition.   Procedures:  Status post right BKA 11/24   Consultants:   Orthopedics.   Antimicrobials:   Anti-infectives (From admission, onward)    Start     Dose/Rate Route Frequency Ordered Stop   04/09/23 1345  doxycycline (VIBRAMYCIN) 100 mg in dextrose 5 % 250 mL IVPB        100 mg 125 mL/hr over 120 Minutes Intravenous Every 12 hours 04/09/23 1252 04/09/23 1746   04/08/23 1300  levofloxacin (LEVAQUIN) IVPB 500 mg  Status:  Discontinued        500 mg 100 mL/hr over 60 Minutes Intravenous On call to O.R.  04/08/23 1229 04/08/23 1648   04/08/23 1300  vancomycin (VANCOREADY) IVPB 1500 mg/300 mL        1,500 mg 100 mL/hr over 180 Minutes Intravenous On call to O.R. 04/08/23 1229 04/08/23 1433   04/08/23 1237  vancomycin HCl (VANCOREADY) 1500 MG/300ML IVPB       Note to Pharmacy: [name] D: cabinet override      04/08/23 1237 04/08/23 1439   04/07/23 0800  piperacillin-tazobactam (ZOSYN) IVPB 3.375 g        3.375 g 12.5 mL/hr over 240 Minutes Intravenous Every 8 hours 04/07/23 0108 04/09/23 2108   04/07/23 0100  doxycycline (VIBRAMYCIN) 100 mg in dextrose 5 % 250 mL IVPB  Status:  Discontinued        100 mg 125 mL/hr over 120 Minutes Intravenous Every 12 hours 04/07/23 0057 04/09/23 1252   04/07/23 0000  ceFEPIme (MAXIPIME) 2 g in sodium chloride 0.9 % 100 mL IVPB  Status:  Discontinued        2 g 200 mL/hr over 30 Minutes Intravenous Every 8 hours 04/06/23 2349 04/07/23 0108   04/06/23 2330  metroNIDAZOLE (FLAGYL) IVPB 500 mg        500 mg 100 mL/hr over 60 Minutes Intravenous  Once 04/06/23 2321 04/07/23 0032   04/06/23 2300  piperacillin-tazobactam (ZOSYN) IVPB 3.375 g  Status:  Discontinued        3.375 g 100 mL/hr over 30 Minutes Intravenous  Once 04/06/23 2257 04/06/23 2320   04/06/23 2300  vancomycin (VANCOREADY) IVPB 2000 mg/400 mL  Status:  Discontinued        2,000 mg 200 mL/hr over 120 Minutes Intravenous  Once 04/06/23 2257 04/06/23 2312        Medications  Scheduled Meds:  (feeding supplement) PROSource Plus  30 mL Oral BID BM   vitamin C  1,000 mg Oral Daily   docusate sodium  100 mg Oral BID   enoxaparin (LOVENOX) injection  40 mg Subcutaneous Q24H   feeding supplement  237 mL Oral BID BM   folic acid  1 mg Oral Daily   Gerhardt's butt cream   Topical BID   insulin aspart  0-9 Units Subcutaneous TID WC   metoCLOPramide (REGLAN) injection  5 mg Intravenous Q6H   metoprolol tartrate  50 mg Oral BID   multivitamin with minerals  1 tablet Oral Daily    pantoprazole  40 mg Oral BID   saccharomyces boulardii  250 mg Oral BID   sodium chloride flush  10 mL Intravenous Q12H   sodium chloride flush  3 mL Intravenous Q12H   Continuous Infusions: PRN Meds:.acetaminophen, guaiFENesin-dextromethorphan, hydrALAZINE, labetalol, metoprolol  tartrate, ondansetron **OR** ondansetron (ZOFRAN) IV, mouth rinse, oxyCODONE, oxyCODONE, phenol, polyethylene glycol    Subjective:   Clarisa Kindred was seen and examined today.   No new complaints.   Objective:   Vitals:   04/24/23 1643 04/24/23 2032 04/25/23 0418 04/25/23 0728  BP: (!) 144/79 (!) 154/81 139/88 110/65  Pulse: 97 98 96 74  Resp: 18 20 20 18   Temp: 98.2 F (36.8 C) 98.2 F (36.8 C) 97.6 F (36.4 C) 98.4 F (36.9 C)  TempSrc: Oral Oral Oral Oral  SpO2: 99% 100% 100% 100%  Weight:   (!) 137.2 kg   Height:        Intake/Output Summary (Last 24 hours) at 04/25/2023 1612 Last data filed at 04/25/2023 0100 Gross per 24 hour  Intake 240 ml  Output 700 ml  Net -460 ml   Filed Weights   04/22/23 0513 04/24/23 0535 04/25/23 0418  Weight: (!) 139.1 kg (!) 140.4 kg (!) 137.2 kg     Exam General exam: Appears calm and comfortable  Respiratory system: Clear to auscultation. Respiratory effort normal. Cardiovascular system: S1 & S2 heard, RRR. No JVD,  Gastrointestinal system: Abdomen is nondistended, soft and nontender. Central nervous system: Alert and oriented. No focal neurological deficits. Extremities: Symmetric 5 x 5 power. Skin: No rashes,  Psychiatry:  Mood & affect appropriate.    Data Reviewed:  I have personally reviewed following labs and imaging studies   CBC Lab Results  Component Value Date   WBC 11.2 (H) 04/18/2023   RBC 3.37 (L) 04/18/2023   HGB 8.4 (L) 04/18/2023   HCT 27.7 (L) 04/18/2023   MCV 82.2 04/18/2023   MCH 24.9 (L) 04/18/2023   PLT 504 (H) 04/18/2023   MCHC 30.3 04/18/2023   RDW 22.8 (H) 04/18/2023   LYMPHSABS 1.5 04/06/2023   MONOABS 1.8  (H) 04/06/2023   EOSABS 0.0 04/06/2023   BASOSABS 0.0 04/06/2023     Last metabolic panel Lab Results  Component Value Date   NA 137 04/18/2023   K 4.0 04/18/2023   CL 107 04/18/2023   CO2 26 04/18/2023   BUN 13 04/18/2023   CREATININE 1.03 04/18/2023   GLUCOSE 93 04/18/2023   GFRNONAA >60 04/18/2023   GFRAA >60 09/20/2016   CALCIUM 7.6 (L) 04/18/2023   PROT 6.3 (L) 04/18/2023   ALBUMIN <1.5 (L) 04/18/2023   BILITOT 0.5 04/18/2023   ALKPHOS 202 (H) 04/18/2023   AST 15 04/18/2023   ALT 12 04/18/2023   ANIONGAP 4 (L) 04/18/2023    CBG (last 3)  Recent Labs    04/24/23 2218 04/25/23 0741 04/25/23 1123  GLUCAP 94 76 76      Coagulation Profile: No results for input(s): "INR", "PROTIME" in the last 168 hours.   Radiology Studies: No results found.     Kathlen Mody M.D. [facility] 04/25/2023, 4:12 PM  Available via Epic secure chat 7am-7pm After 7 pm, please refer to night coverage provider listed on amion.

## 2023-04-25 NOTE — Plan of Care (Signed)
 Problem: Fluid Volume: Goal: Hemodynamic stability will improve Outcome: Progressing   Problem: Clinical Measurements: Goal: Diagnostic test results will improve Outcome: Progressing Goal: Signs and symptoms of infection will decrease Outcome: Progressing   Problem: Respiratory: Goal: Ability to maintain adequate ventilation will improve Outcome: Progressing   Problem: Education: Goal: Knowledge of General Education information will improve Description: Including pain rating scale, medication(s)/side effects and non-pharmacologic comfort measures Outcome: Progressing   Problem: Health Behavior/Discharge Planning: Goal: Ability to manage health-related needs will improve Outcome: Progressing   Problem: Clinical Measurements: Goal: Ability to maintain clinical measurements within normal limits will improve Outcome: Progressing Goal: Will remain free from infection Outcome: Progressing Goal: Diagnostic test results will improve Outcome: Progressing Goal: Respiratory complications will improve Outcome: Progressing Goal: Cardiovascular complication will be avoided Outcome: Progressing   Problem: Activity: Goal: Risk for activity intolerance will decrease Outcome: Progressing   Problem: Nutrition: Goal: Adequate nutrition will be maintained Outcome: Progressing   Problem: Coping: Goal: Level of anxiety will decrease Outcome: Progressing   Problem: Elimination: Goal: Will not experience complications related to bowel motility Outcome: Progressing Goal: Will not experience complications related to urinary retention Outcome: Progressing   Problem: Pain Management: Goal: General experience of comfort will improve Outcome: Progressing   Problem: Safety: Goal: Ability to remain free from injury will improve Outcome: Progressing   Problem: Skin Integrity: Goal: Risk for impaired skin integrity will decrease Outcome: Progressing   Problem: Activity: Goal: Ability  to tolerate increased activity will improve Outcome: Progressing   Problem: Clinical Measurements: Goal: Ability to maintain a body temperature in the normal range will improve Outcome: Progressing   Problem: Respiratory: Goal: Ability to maintain adequate ventilation will improve Outcome: Progressing Goal: Ability to maintain a clear airway will improve Outcome: Progressing   Problem: Education: Goal: Knowledge of the prescribed therapeutic regimen will improve Outcome: Progressing Goal: Ability to verbalize activity precautions or restrictions will improve Outcome: Progressing Goal: Understanding of discharge needs will improve Outcome: Progressing   Problem: Activity: Goal: Ability to perform//tolerate increased activity and mobilize with assistive devices will improve Outcome: Progressing   Problem: Clinical Measurements: Goal: Postoperative complications will be avoided or minimized Outcome: Progressing   Problem: Self-Care: Goal: Ability to meet self-care needs will improve Outcome: Progressing   Problem: Self-Concept: Goal: Ability to maintain and perform role responsibilities to the fullest extent possible will improve Outcome: Progressing   Problem: Education: Goal: Knowledge of General Education information will improve Description: Including pain rating scale, medication(s)/side effects and non-pharmacologic comfort measures Outcome: Progressing   Problem: Health Behavior/Discharge Planning: Goal: Ability to manage health-related needs will improve Outcome: Progressing   Problem: Clinical Measurements: Goal: Ability to maintain clinical measurements within normal limits will improve Outcome: Progressing Goal: Will remain free from infection Outcome: Progressing Goal: Diagnostic test results will improve Outcome: Progressing Goal: Respiratory complications will improve Outcome: Progressing Goal: Cardiovascular complication will be avoided Outcome:  Progressing   Problem: Activity: Goal: Risk for activity intolerance will decrease Outcome: Progressing   Problem: Nutrition: Goal: Adequate nutrition will be maintained Outcome: Progressing   Problem: Coping: Goal: Level of anxiety will decrease Outcome: Progressing   Problem: Elimination: Goal: Will not experience complications related to bowel motility Outcome: Progressing Goal: Will not experience complications related to urinary retention Outcome: Progressing   Problem: Pain Management: Goal: General experience of comfort will improve Outcome: Progressing   Problem: Safety: Goal: Ability to remain free from injury will improve Outcome: Progressing   Problem: Skin Integrity: Goal: Risk for  impaired skin integrity will decrease Outcome: Progressing

## 2023-04-25 NOTE — Progress Notes (Signed)
Pt glucose 65, pt drank 8 oz OJ, Recheck BS increased to 96. Pt is not receiving any insulin coverage only monitoring. Notified MD

## 2023-04-26 ENCOUNTER — Inpatient Hospital Stay (HOSPITAL_COMMUNITY): Payer: Medicaid Other

## 2023-04-26 DIAGNOSIS — D649 Anemia, unspecified: Secondary | ICD-10-CM | POA: Diagnosis not present

## 2023-04-26 DIAGNOSIS — E119 Type 2 diabetes mellitus without complications: Secondary | ICD-10-CM | POA: Diagnosis not present

## 2023-04-26 DIAGNOSIS — N182 Chronic kidney disease, stage 2 (mild): Secondary | ICD-10-CM | POA: Diagnosis not present

## 2023-04-26 DIAGNOSIS — A419 Sepsis, unspecified organism: Secondary | ICD-10-CM | POA: Diagnosis not present

## 2023-04-26 LAB — CBC WITH DIFFERENTIAL/PLATELET
Abs Immature Granulocytes: 0.04 10*3/uL (ref 0.00–0.07)
Basophils Absolute: 0 10*3/uL (ref 0.0–0.1)
Basophils Relative: 1 %
Eosinophils Absolute: 0.2 10*3/uL (ref 0.0–0.5)
Eosinophils Relative: 3 %
HCT: 29.4 % — ABNORMAL LOW (ref 39.0–52.0)
Hemoglobin: 8.8 g/dL — ABNORMAL LOW (ref 13.0–17.0)
Immature Granulocytes: 1 %
Lymphocytes Relative: 21 %
Lymphs Abs: 1.8 10*3/uL (ref 0.7–4.0)
MCH: 25 pg — ABNORMAL LOW (ref 26.0–34.0)
MCHC: 29.9 g/dL — ABNORMAL LOW (ref 30.0–36.0)
MCV: 83.5 fL (ref 80.0–100.0)
Monocytes Absolute: 0.7 10*3/uL (ref 0.1–1.0)
Monocytes Relative: 8 %
Neutro Abs: 5.7 10*3/uL (ref 1.7–7.7)
Neutrophils Relative %: 66 %
Platelets: 407 10*3/uL — ABNORMAL HIGH (ref 150–400)
RBC: 3.52 MIL/uL — ABNORMAL LOW (ref 4.22–5.81)
RDW: 22.4 % — ABNORMAL HIGH (ref 11.5–15.5)
Smear Review: NORMAL
WBC: 8.5 10*3/uL (ref 4.0–10.5)
nRBC: 0 % (ref 0.0–0.2)

## 2023-04-26 LAB — BASIC METABOLIC PANEL
Anion gap: 6 (ref 5–15)
BUN: 6 mg/dL (ref 6–20)
CO2: 22 mmol/L (ref 22–32)
Calcium: 7.5 mg/dL — ABNORMAL LOW (ref 8.9–10.3)
Chloride: 110 mmol/L (ref 98–111)
Creatinine, Ser: 1.57 mg/dL — ABNORMAL HIGH (ref 0.61–1.24)
GFR, Estimated: 54 mL/min — ABNORMAL LOW (ref >60)
Glucose, Bld: 100 mg/dL — ABNORMAL HIGH (ref 70–99)
Potassium: 4.1 mmol/L (ref 3.5–5.1)
Sodium: 138 mmol/L (ref 135–145)

## 2023-04-26 LAB — GLUCOSE, CAPILLARY
Glucose-Capillary: 79 mg/dL (ref 70–99)
Glucose-Capillary: 200 mg/dL — ABNORMAL HIGH (ref 70–99)
Glucose-Capillary: 147 mg/dL — ABNORMAL HIGH (ref 70–99)
Glucose-Capillary: 79 mg/dL (ref 70–99)

## 2023-04-26 NOTE — Progress Notes (Signed)
Pt complained of SOB.saturations 100% on RA. Lungs sounds clear and dimension bilaterally. Pt refused to use IS, he stated that, "it hurts his stomach to use it." I explained the benefits of using IS due to the lack of mobility. Pt said he understands and still refused.

## 2023-04-26 NOTE — Progress Notes (Signed)
[facility]                                                                               Alan Tapia, is a 47 y.o. 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 He was also having nausea, vomiting and diarrhea. Orthopedic surgery was consulted and patient underwent right BKA 11/27 by Dr. Lajoyce Corners.  No new complaints. TOC On board to help with disposition.  Patient's wife is trying arrange housing after which [facility] will be ordered.  Pt seen and examined today. He is intermittent refusing care.     Assessment & Plan    Assessment and Plan:  Sepsis sec to Non healing right foot ulcer Previous history of left BKA -Status post right BKA 11/24 -Blood cultures negative. - recommend outpatient follow up with Dr Lajoyce Corners. Pt remains afebrile and minimal leukocytosis.    Microcytic anemia FOBT negative. S/p PRBC transfusions this hospitalization.  Combination of iron def anemia and anemia of chronic disease.     Hypertension Optimal BP parameters today.  Resume home meds.    Type 2 DM with hyperglycemia CBG (last 3)  Recent Labs    04/26/23 0806 04/26/23 1214 04/26/23 1616  GLUCAP 79 200* 147*   Resume SSI.  A1c IS 6.8% no changes in meds.    AKI resolved.   Vomiting suspect from gastroparesis.  Currently on reglan , simethicone added.  Continue with PPI.  [place] abd showed liver cirrhosis with sludge in GB.  PT denies any abd pain.  Having bowel movements.  No new complaints.     Anemia of chronic disease:  Hemoglobin around 8 and stable.   Dyspnea earlier today CXR ordered.  Vital signs have been stable.     RN Pressure Injury Documentation: Pressure Injury 04/13/23 Perineum Bilateral Stage 2 -  Partial thickness loss of dermis presenting as a shallow open injury with a red, pink wound bed without slough. perineum/scrotum masd on admission (Active)  04/13/23 1100  Location: Perineum  Location Orientation: Bilateral  Staging: Stage 2 -  Partial thickness loss of dermis presenting as a shallow open injury with a red, pink wound bed without slough.  Wound Description (Comments): perineum/scrotum masd on admission  Present on Admission:   Dressing Type Negative pressure wound therapy 04/24/23 1300    Malnutrition Type:  Nutrition Problem: Moderate Malnutrition Etiology: chronic illness   Malnutrition Characteristics:  Signs/Symptoms: energy intake < or equal to 75% for > or equal to 1 month, moderate fat depletion, moderate muscle depletion   Nutrition Interventions:  Interventions: MVI, Magic cup  Estimated body mass index is 36.82 kg/m as calculated from the following:   Height as of this encounter: 6\' 4"  (1.93 m).  Weight as of this encounter: 137.2 kg.  Code Status: FULL CODE DVT Prophylaxis:  enoxaparin (LOVENOX) injection 40 mg Start: 04/15/23 2000 SCD's Start: 04/08/23 1658 SCDs Start: 04/07/23 0104   Level of Care: Level of care: Med-Surg Family Communication: none at bedside.  Disposition Plan:     Remains inpatient appropriate: awaiting safe disposition.   Procedures:  Status post right BKA 11/24   Consultants:   Orthopedics.   Antimicrobials:   Anti-infectives (From admission, onward)    Start     Dose/Rate Route Frequency Ordered Stop   04/09/23 1345  doxycycline (VIBRAMYCIN) 100 mg in dextrose 5 % 250 mL IVPB        100 mg 125 mL/hr over 120 Minutes Intravenous Every 12 hours 04/09/23 1252 04/09/23 1746    04/08/23 1300  levofloxacin (LEVAQUIN) IVPB 500 mg  Status:  Discontinued        500 mg 100 mL/hr over 60 Minutes Intravenous On call to O.R. 04/08/23 1229 04/08/23 1648   04/08/23 1300  vancomycin (VANCOREADY) IVPB 1500 mg/300 mL        1,500 mg 100 mL/hr over 180 Minutes Intravenous On call to O.R. 04/08/23 1229 04/08/23 1433   04/08/23 1237  vancomycin HCl (VANCOREADY) 1500 MG/300ML IVPB       Note to Pharmacy: [name] D: cabinet override      04/08/23 1237 04/08/23 1439   04/07/23 0800  piperacillin-tazobactam (ZOSYN) IVPB 3.375 g        3.375 g 12.5 mL/hr over 240 Minutes Intravenous Every 8 hours 04/07/23 0108 04/09/23 2108   04/07/23 0100  doxycycline (VIBRAMYCIN) 100 mg in dextrose 5 % 250 mL IVPB  Status:  Discontinued        100 mg 125 mL/hr over 120 Minutes Intravenous Every 12 hours 04/07/23 0057 04/09/23 1252   04/07/23 0000  ceFEPIme (MAXIPIME) 2 g in sodium chloride 0.9 % 100 mL IVPB  Status:  Discontinued        2 g 200 mL/hr over 30 Minutes Intravenous Every 8 hours 04/06/23 2349 04/07/23 0108   04/06/23 2330  metroNIDAZOLE (FLAGYL) IVPB 500 mg        500 mg 100 mL/hr over 60 Minutes Intravenous  Once 04/06/23 2321 04/07/23 0032   04/06/23 2300  piperacillin-tazobactam (ZOSYN) IVPB 3.375 g  Status:  Discontinued        3.375 g 100 mL/hr over 30 Minutes Intravenous  Once 04/06/23 2257 04/06/23 2320   04/06/23 2300  vancomycin (VANCOREADY) IVPB 2000 mg/400 mL  Status:  Discontinued        2,000 mg 200 mL/hr over 120 Minutes Intravenous  Once 04/06/23 2257 04/06/23 2312        Medications  Scheduled Meds:  (feeding supplement) PROSource Plus  30 mL Oral BID BM   vitamin C  1,000 mg Oral Daily   docusate sodium  100 mg Oral BID   enoxaparin (LOVENOX) injection  40 mg Subcutaneous Q24H   feeding supplement  237 mL Oral BID BM   folic acid  1 mg Oral Daily   Gerhardt's butt cream   Topical BID   insulin aspart  0-9 Units Subcutaneous TID WC    metoCLOPramide (REGLAN) injection  5 mg Intravenous Q6H   metoprolol tartrate  50 mg Oral BID   multivitamin with minerals  1 tablet Oral Daily   pantoprazole  40 mg Oral BID   saccharomyces boulardii  250 mg Oral BID   sodium chloride flush  10  mL Intravenous Q12H   sodium chloride flush  3 mL Intravenous Q12H   Continuous Infusions: PRN Meds:.acetaminophen, guaiFENesin-dextromethorphan, hydrALAZINE, labetalol, metoprolol tartrate, ondansetron **OR** ondansetron (ZOFRAN) IV, mouth rinse, oxyCODONE, oxyCODONE, phenol, polyethylene glycol    Subjective:   Alan Tapia was seen and examined today.   Some sob earlier this morning. Resolved now.   Objective:   Vitals:   04/25/23 1707 04/26/23 0448 04/26/23 0917 04/26/23 1551  BP: (!) 144/84 (!) 142/75 137/72 (!) 120/52  Pulse: 87 80 87 78  Resp: 18 18 17 17   Temp: 97.7 F (36.5 C) 98 F (36.7 C) 97.8 F (36.6 C) (!) 97.5 F (36.4 C)  TempSrc: Oral Oral Oral Oral  SpO2: 99% 100% 93% 99%  Weight:      Height:        Intake/Output Summary (Last 24 hours) at 04/26/2023 1756 Last data filed at 04/26/2023 1345 Gross per 24 hour  Intake --  Output 600 ml  Net -600 ml   Filed Weights   04/22/23 0513 04/24/23 0535 04/25/23 0418  Weight: (!) 139.1 kg (!) 140.4 kg (!) 137.2 kg     Exam General exam: Appears calm and comfortable  Respiratory system: Clear to auscultation. Respiratory effort normal. Cardiovascular system: S1 & S2 heard, RRR. No JVD,  Gastrointestinal system: Abdomen is nondistended, soft and nontender. Central nervous system: Alert and oriented. No focal neurological deficits.    Data Reviewed:  I have personally reviewed following labs and imaging studies   CBC Lab Results  Component Value Date   WBC 8.5 04/26/2023   RBC 3.52 (L) 04/26/2023   HGB 8.8 (L) 04/26/2023   HCT 29.4 (L) 04/26/2023   MCV 83.5 04/26/2023   MCH 25.0 (L) 04/26/2023   PLT 407 (H) 04/26/2023   MCHC 29.9 (L) 04/26/2023    RDW 22.4 (H) 04/26/2023   LYMPHSABS 1.8 04/26/2023   MONOABS 0.7 04/26/2023   EOSABS 0.2 04/26/2023   BASOSABS 0.0 04/26/2023     Last metabolic panel Lab Results  Component Value Date   NA 138 04/26/2023   K 4.1 04/26/2023   CL 110 04/26/2023   CO2 22 04/26/2023   BUN 6 04/26/2023   CREATININE 1.57 (H) 04/26/2023   GLUCOSE 100 (H) 04/26/2023   GFRNONAA 54 (L) 04/26/2023   GFRAA >60 09/20/2016   CALCIUM 7.5 (L) 04/26/2023   PROT 6.3 (L) 04/18/2023   ALBUMIN <1.5 (L) 04/18/2023   BILITOT 0.5 04/18/2023   ALKPHOS 202 (H) 04/18/2023   AST 15 04/18/2023   ALT 12 04/18/2023   ANIONGAP 6 04/26/2023    CBG (last 3)  Recent Labs    04/26/23 0806 04/26/23 1214 04/26/23 1616  GLUCAP 79 200* 147*      Coagulation Profile: No results for input(s): "INR", "PROTIME" in the last 168 hours.   Radiology Studies: DG CHEST PORT 1 VIEW Result Date: 04/26/2023 CLINICAL DATA:  [id number] Dyspnea [id number] EXAM: PORTABLE CHEST 1 VIEW COMPARISON:  04/06/2023 chest radiograph. FINDINGS: Stable cardiomediastinal silhouette with top-normal heart size. No pneumothorax. No right pleural effusion. Small to moderate left pleural effusion, similar to slightly increased. Patchy left lung base opacity is similar. IMPRESSION: 1. Small to moderate left pleural effusion, similar to slightly increased. 2. Similar patchy left lung base opacity, which could represent atelectasis, aspiration or pneumonia. Electronically Signed   By: Delbert Phenix M.D.   On: 04/26/2023 15:35       Kathlen Mody M.D. [facility] 04/26/2023, 5:56 PM  Available via  Epic secure chat 7am-7pm After 7 pm, please refer to night coverage provider listed on amion.

## 2023-04-26 NOTE — Plan of Care (Signed)
  Problem: Clinical Measurements: Goal: Will remain free from infection Outcome: Progressing   Problem: Nutrition: Goal: Adequate nutrition will be maintained Outcome: Progressing   Problem: Elimination: Goal: Will not experience complications related to bowel motility Outcome: Progressing Goal: Will not experience complications related to urinary retention Outcome: Progressing   Problem: Pain Management: Goal: General experience of comfort will improve Outcome: Progressing   Problem: Safety: Goal: Ability to remain free from injury will improve Outcome: Progressing   Problem: Skin Integrity: Goal: Risk for impaired skin integrity will decrease Outcome: Progressing

## 2023-04-26 NOTE — Progress Notes (Signed)
Pt continues to refuse insulin coverage for blood sugar per MD's sliding scale order.

## 2023-04-26 NOTE — Progress Notes (Signed)
After pt had agreed earlier to transfer from bed to chair via the maximove. He refused when the CN and I went in to attempt to transfer him from the bed to the chair. He acted as if we never had the conversation and he agreed to do so previously.

## 2023-04-27 ENCOUNTER — Inpatient Hospital Stay (HOSPITAL_COMMUNITY): Payer: Medicaid Other

## 2023-04-27 DIAGNOSIS — N182 Chronic kidney disease, stage 2 (mild): Secondary | ICD-10-CM | POA: Diagnosis not present

## 2023-04-27 DIAGNOSIS — E119 Type 2 diabetes mellitus without complications: Secondary | ICD-10-CM | POA: Diagnosis not present

## 2023-04-27 DIAGNOSIS — D649 Anemia, unspecified: Secondary | ICD-10-CM | POA: Diagnosis not present

## 2023-04-27 DIAGNOSIS — A419 Sepsis, unspecified organism: Secondary | ICD-10-CM | POA: Diagnosis not present

## 2023-04-27 LAB — BASIC METABOLIC PANEL
Anion gap: 5 (ref 5–15)
BUN: 6 mg/dL (ref 6–20)
CO2: 26 mmol/L (ref 22–32)
Calcium: 7.5 mg/dL — ABNORMAL LOW (ref 8.9–10.3)
Chloride: 108 mmol/L (ref 98–111)
Creatinine, Ser: 1.1 mg/dL (ref 0.61–1.24)
GFR, Estimated: >60 mL/min (ref >60)
Glucose, Bld: 124 mg/dL — ABNORMAL HIGH (ref 70–99)
Potassium: 4.3 mmol/L (ref 3.5–5.1)
Sodium: 139 mmol/L (ref 135–145)

## 2023-04-27 LAB — GLUCOSE, CAPILLARY
Glucose-Capillary: 92 mg/dL (ref 70–99)
Glucose-Capillary: 88 mg/dL (ref 70–99)
Glucose-Capillary: 105 mg/dL — ABNORMAL HIGH (ref 70–99)
Glucose-Capillary: 132 mg/dL — ABNORMAL HIGH (ref 70–99)
Glucose-Capillary: 171 mg/dL — ABNORMAL HIGH (ref 70–99)

## 2023-04-27 NOTE — Progress Notes (Addendum)
   04/27/23 1642  Mobility  Activity Turned to right side;Turned to left side;Turned to back - supine (repositioned in bed)  Level of Assistance Moderate assist, patient does 50-74%  Assistive Device  (Bedrails)  RLE Weight Bearing Per Provider Order NWB  Activity Response Tolerated fair  Mobility Referral Yes  Mobility visit 1 Mobility  Mobility Specialist Start Time (ACUTE ONLY) 1538  Mobility Specialist Stop Time (ACUTE ONLY) 1558  Mobility Specialist Time Calculation (min) (ACUTE ONLY) 20 min   Mobility Specialist: Progress Note  Pt agreeable to mobility session - received in bed. Required ModA+2 using bedrails. C/o RLE pain throughout. Left in bed with all needs met - call bell within reach. Visitor present.  Per pt request, MS assisted with repositioning in an upright position post PT session. MS offered to help pt into chair using Maximove an additional time, pt denied and stated "another day."  Barnie Mort, BS Mobility Specialist Please contact via SecureChat or Rehab office at [phone].

## 2023-04-27 NOTE — Progress Notes (Signed)
[facility]                                                                               Alan Tapia, is a 47 y.o. 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 He was also having nausea, vomiting and diarrhea. Orthopedic surgery was consulted and patient underwent right BKA 11/27 by Dr. Lajoyce Corners.  No new complaints. TOC On board to help with disposition.  Patient's wife is trying arrange housing after which [facility] will be ordered.     Assessment & Plan    Assessment and Plan:  Sepsis sec to Non healing right foot ulcer Previous history of left BKA -Status post right BKA 11/24 -Blood cultures negative. - recommend outpatient follow up with Dr Lajoyce Corners. Pt remains afebrile and wbc wnl.    Microcytic anemia FOBT negative. S/p PRBC transfusions this hospitalization.  Combination of iron def anemia and anemia of chronic disease.     Hypertension Well controlled.  Continue with metoprolol 50 mg BID.    Type 2 DM with hyperglycemia CBG (last 3)  Recent Labs    04/27/23 0810 04/27/23 1214 04/27/23 1709  GLUCAP 88 105* 132*   Resume SSI.  A1c IS 6.8% no changes in meds.    AKI  Creatinine increased to 1.5 , unclear etiology.  Will get US renal, UA and urine electrolytes.  Check BMP in am.  If creatinine continues to worsen, he might benefit from IV fluids.   Vomiting suspect from gastroparesis.  Currently on reglan , simethicone added.  Continue with PPI.  [place] abd showed liver  cirrhosis with sludge in GB.  PT denies any abd pain.  Having bowel movements.  No new complaints.     An episode of dyspnea CXR ordered.  Vital signs have been stable.     RN Pressure Injury Documentation: Pressure Injury 04/13/23 Perineum Bilateral Stage 2 -  Partial thickness loss of dermis presenting as a shallow open injury with a red, pink wound bed without slough. perineum/scrotum masd on admission (Active)  04/13/23 1100  Location: Perineum  Location Orientation: Bilateral  Staging: Stage 2 -  Partial thickness loss of dermis presenting as a shallow open injury with a red, pink wound bed without slough.  Wound Description (Comments): perineum/scrotum masd on admission  Present on Admission:   Dressing Type Negative pressure wound therapy 04/24/23 1300    Malnutrition Type:  Nutrition Problem: Moderate Malnutrition Etiology: chronic illness   Malnutrition Characteristics:  Signs/Symptoms: energy intake < or equal to 75% for > or equal to 1 month, moderate fat depletion, moderate muscle depletion   Nutrition Interventions:  Interventions: MVI, Magic cup  Estimated body mass index is 36.82 kg/m as calculated from the  following:   Height as of this encounter: 6\' 4"  (1.93 m).   Weight as of this encounter: 137.2 kg.  Code Status: FULL CODE DVT Prophylaxis:  enoxaparin (LOVENOX) injection 40 mg Start: 04/15/23 2000 SCD's Start: 04/08/23 1658 SCDs Start: 04/07/23 0104   Level of Care: Level of care: Med-Surg Family Communication: none at bedside.  Disposition Plan:     Remains inpatient appropriate: awaiting safe disposition.   Procedures:  Status post right BKA 11/24   Consultants:   Orthopedics.   Antimicrobials:   Anti-infectives (From admission, onward)    Start     Dose/Rate Route Frequency Ordered Stop   04/09/23 1345  doxycycline (VIBRAMYCIN) 100 mg in dextrose 5 % 250 mL IVPB        100 mg 125 mL/hr over 120 Minutes Intravenous Every 12  hours 04/09/23 1252 04/09/23 1746   04/08/23 1300  levofloxacin (LEVAQUIN) IVPB 500 mg  Status:  Discontinued        500 mg 100 mL/hr over 60 Minutes Intravenous On call to O.R. 04/08/23 1229 04/08/23 1648   04/08/23 1300  vancomycin (VANCOREADY) IVPB 1500 mg/300 mL        1,500 mg 100 mL/hr over 180 Minutes Intravenous On call to O.R. 04/08/23 1229 04/08/23 1433   04/08/23 1237  vancomycin HCl (VANCOREADY) 1500 MG/300ML IVPB       Note to Pharmacy: [name] D: cabinet override      04/08/23 1237 04/08/23 1439   04/07/23 0800  piperacillin-tazobactam (ZOSYN) IVPB 3.375 g        3.375 g 12.5 mL/hr over 240 Minutes Intravenous Every 8 hours 04/07/23 0108 04/09/23 2108   04/07/23 0100  doxycycline (VIBRAMYCIN) 100 mg in dextrose 5 % 250 mL IVPB  Status:  Discontinued        100 mg 125 mL/hr over 120 Minutes Intravenous Every 12 hours 04/07/23 0057 04/09/23 1252   04/07/23 0000  ceFEPIme (MAXIPIME) 2 g in sodium chloride 0.9 % 100 mL IVPB  Status:  Discontinued        2 g 200 mL/hr over 30 Minutes Intravenous Every 8 hours 04/06/23 2349 04/07/23 0108   04/06/23 2330  metroNIDAZOLE (FLAGYL) IVPB 500 mg        500 mg 100 mL/hr over 60 Minutes Intravenous  Once 04/06/23 2321 04/07/23 0032   04/06/23 2300  piperacillin-tazobactam (ZOSYN) IVPB 3.375 g  Status:  Discontinued        3.375 g 100 mL/hr over 30 Minutes Intravenous  Once 04/06/23 2257 04/06/23 2320   04/06/23 2300  vancomycin (VANCOREADY) IVPB 2000 mg/400 mL  Status:  Discontinued        2,000 mg 200 mL/hr over 120 Minutes Intravenous  Once 04/06/23 2257 04/06/23 2312        Medications  Scheduled Meds:  (feeding supplement) PROSource Plus  30 mL Oral BID BM   vitamin C  1,000 mg Oral Daily   docusate sodium  100 mg Oral BID   enoxaparin (LOVENOX) injection  40 mg Subcutaneous Q24H   feeding supplement  237 mL Oral BID BM   folic acid  1 mg Oral Daily   Gerhardt's butt cream   Topical BID   insulin aspart  0-9  Units Subcutaneous TID WC   metoCLOPramide (REGLAN) injection  5 mg Intravenous Q6H   metoprolol tartrate  50 mg Oral BID   multivitamin with minerals  1 tablet Oral Daily   pantoprazole  40 mg Oral BID  saccharomyces boulardii  250 mg Oral BID   sodium chloride flush  10 mL Intravenous Q12H   sodium chloride flush  3 mL Intravenous Q12H   Continuous Infusions: PRN Meds:.acetaminophen, guaiFENesin-dextromethorphan, hydrALAZINE, labetalol, metoprolol tartrate, ondansetron **OR** ondansetron (ZOFRAN) IV, mouth rinse, oxyCODONE, oxyCODONE, phenol, polyethylene glycol    Subjective:   Clarisa Kindred was seen and examined today.   No new complaints.   Objective:   Vitals:   04/26/23 2032 04/27/23 0648 04/27/23 0812 04/27/23 1534  BP: (!) 157/86 139/72 (!) 149/78 136/78  Pulse: 92 71 79 73  Resp: 16 18 18 18   Temp: 97.7 F (36.5 C) 97.6 F (36.4 C) 98.1 F (36.7 C) 98.2 F (36.8 C)  TempSrc: Oral Oral Oral   SpO2: 98% 98% 100% 100%  Weight:      Height:        Intake/Output Summary (Last 24 hours) at 04/27/2023 1739 Last data filed at 04/27/2023 1300 Gross per 24 hour  Intake 960 ml  Output 300 ml  Net 660 ml   Filed Weights   04/22/23 0513 04/24/23 0535 04/25/23 0418  Weight: (!) 139.1 kg (!) 140.4 kg (!) 137.2 kg     Exam General exam: Appears calm and comfortable  Respiratory system: Clear to auscultation. Respiratory effort normal. Cardiovascular system: S1 & S2 heard, RRR. No JVD, Gastrointestinal system: Abdomen is nondistended, soft and nontender.  Central nervous system: Alert and oriented.  Extremities:  Left BKA, right BKA , stump connected to wound vac.  Skin: No rashes,  Psychiatry:  Mood & affect appropriate.      Data Reviewed:  I have personally reviewed following labs and imaging studies   CBC Lab Results  Component Value Date   WBC 8.5 04/26/2023   RBC 3.52 (L) 04/26/2023   HGB 8.8 (L) 04/26/2023   HCT 29.4 (L) 04/26/2023   MCV 83.5  04/26/2023   MCH 25.0 (L) 04/26/2023   PLT 407 (H) 04/26/2023   MCHC 29.9 (L) 04/26/2023   RDW 22.4 (H) 04/26/2023   LYMPHSABS 1.8 04/26/2023   MONOABS 0.7 04/26/2023   EOSABS 0.2 04/26/2023   BASOSABS 0.0 04/26/2023     Last metabolic panel Lab Results  Component Value Date   NA 138 04/26/2023   K 4.1 04/26/2023   CL 110 04/26/2023   CO2 22 04/26/2023   BUN 6 04/26/2023   CREATININE 1.57 (H) 04/26/2023   GLUCOSE 100 (H) 04/26/2023   GFRNONAA 54 (L) 04/26/2023   GFRAA >60 09/20/2016   CALCIUM 7.5 (L) 04/26/2023   PROT 6.3 (L) 04/18/2023   ALBUMIN <1.5 (L) 04/18/2023   BILITOT 0.5 04/18/2023   ALKPHOS 202 (H) 04/18/2023   AST 15 04/18/2023   ALT 12 04/18/2023   ANIONGAP 6 04/26/2023    CBG (last 3)  Recent Labs    04/27/23 0810 04/27/23 1214 04/27/23 1709  GLUCAP 88 105* 132*      Coagulation Profile: No results for input(s): "INR", "PROTIME" in the last 168 hours.   Radiology Studies: DG CHEST PORT 1 VIEW Result Date: 04/26/2023 CLINICAL DATA:  [id number] Dyspnea [id number] EXAM: PORTABLE CHEST 1 VIEW COMPARISON:  04/06/2023 chest radiograph. FINDINGS: Stable cardiomediastinal silhouette with top-normal heart size. No pneumothorax. No right pleural effusion. Small to moderate left pleural effusion, similar to slightly increased. Patchy left lung base opacity is similar. IMPRESSION: 1. Small to moderate left pleural effusion, similar to slightly increased. 2. Similar patchy left lung base opacity, which could represent atelectasis, aspiration or pneumonia.  Electronically Signed   By: Delbert Phenix M.D.   On: 04/26/2023 15:35       Kathlen Mody M.D. [facility] 04/27/2023, 5:39 PM  Available via Epic secure chat 7am-7pm After 7 pm, please refer to night coverage provider listed on amion.

## 2023-04-27 NOTE — Progress Notes (Signed)
Physical Therapy Treatment Patient Details Name: Alan Tapia. MRN: [id number] DOB: 1975/06/25 Today's Date: 04/27/2023   History of Present Illness The pt is a 47 yo male presenting 11/25 with nausea, vomiting, and fever as well as R foot wound. 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    PT Comments  Pt received in supine, agreeable to therapy session but requesting to discuss plan for transfer. Pt noted to not have proper equipment in room so PTA and spouse/pt discussing safe technique options for transfers and padding to allow comfort for him to proceed. PTA leaving room to obtain equipment at end of session, RN/NT notified of what he will need. Also reviewed limb guard donning/doffing and pt dependent for therapist to place/adjust limb guard, spouse present and receptive also to instruction. Pt continues to benefit from PT services to progress toward functional mobility goals, currently recommend short term lower intensity therapies <3 hours/day but per chart review pt prefers home with HHPT.     If plan is discharge home, recommend the following: Two people to help with walking and/or transfers;Assist for transportation;Assistance with cooking/housework;Help with stairs or ramp for entrance   Can travel by private vehicle     No  Equipment Recommendations  Hoyer lift;Wheelchair cushion (measurements PT);Wheelchair (measurements PT)    Recommendations for Other Services       Precautions / Restrictions Precautions Precautions: Fall;Other (comment) Precaution Comments: RLE limb protector Restrictions Weight Bearing Restrictions Per Provider Order: Yes RLE Weight Bearing Per Provider Order: Non weight bearing Other Position/Activity Restrictions: bilateral BKA     Mobility      Balance Overall balance  assessment: Needs assistance Sitting-balance support: Bilateral upper extremity supported, Feet unsupported   Sitting balance - Comments: pt encouraged to attempt A/P transfer from bed>chair by therapist but he defers today       Cognition Arousal: Alert Behavior During Therapy: WFL for tasks assessed/performed Overall Cognitive Status: Within Functional Limits for tasks assessed                                 General Comments: Patient able to notify PTA about techniques that helped in previous transfers (in other room) and needs encouragement to initiate movements on his own more rather than relying on family/staff to move him dependently.        Exercises Amputee Exercises Quad Sets: AROM, Right, 5 reps, Supine Hip ABduction/ADduction: AAROM, Both, 5 reps, Supine Hip Flexion/Marching: AAROM, Both, 5 reps, Supine Other Exercises Other Exercises: HEP: Visit    [email or web address]    Access Code: [email or web address]    General Comments General comments (skin integrity, edema, etc.): VSS per chart review, no acute s/sx distress; pt with slight soiling on his bed pad but denies need for bed pan, pt given clean-up and c/o peri area discomfort despite PTA attempting to dab area/not swipe. PTA offers barrier ointment for chafing but pt defers and states his white paste ointment is out of stock and this is only thing that works for that area/pain. PTA brought drop arm chair to his room (too narrow per pt/spouse) and then bariatric recliner was located and brought to room at end of second session.      Pertinent Vitals/Pain Pain Assessment Pain Assessment: Faces Faces Pain  Scale: Hurts little more Pain Location: bottom with peri-care, RLE Pain Descriptors / Indicators: Grimacing, Discomfort, Guarding, Moaning Pain Intervention(s): Limited activity within patient's tolerance, Monitored during session, Repositioned     PT Goals (current goals can now be found in the  care plan section) Acute Rehab PT Goals Patient Stated Goal: get bil prosthetics PT Goal Formulation: With patient Time For Goal Achievement: 04/24/23 Progress towards PT goals: Not progressing toward goals - comment (slowly progressing, due for goal update)    Frequency    Min 1X/week      PT Plan         AM-PAC PT "6 Clicks" Mobility   Outcome Measure  Help needed turning from your back to your side while in a flat bed without using bedrails?: A Lot Help needed moving from lying on your back to sitting on the side of a flat bed without using bedrails?: A Lot Help needed moving to and from a bed to a chair (including a wheelchair)?: Total Help needed standing up from a chair using your arms (e.g., wheelchair or bedside chair)?: Total Help needed to walk in hospital room?: Total Help needed climbing 3-5 steps with a railing? : Total 6 Click Score: 8    End of Session Equipment Utilized During Treatment: Other (comment) (R limb protector) Activity Tolerance: No increased pain Patient left: in bed;with call bell/phone within reach;with bed alarm set;with family/visitor present Nurse Communication: Mobility status;Need for lift equipment;Other (comment) (pt needs x2 geomat cushions in order to get OOB to chair as per previous session) PT Visit Diagnosis: Other abnormalities of gait and mobility (R26.89);Muscle weakness (generalized) (M62.81);Pain Pain - part of body: Leg (scrotum/peri area)     Time: 1440-1455 PT Time Calculation (min) (ACUTE ONLY): 15 min  Charges:    $Self Care/Home Management: 8-22 PT General Charges $$ ACUTE PT VISIT: 1 Visit                     Kaiah Hosea P., PTA Acute Rehabilitation Services Secure Chat Preferred 9a-5:30pm Office: [phone]    Dorathy Kinsman [facility] 04/27/2023, 4:38 PM

## 2023-04-27 NOTE — Progress Notes (Addendum)
Physical Therapy Treatment Patient Details Name: Alan Tapia. MRN: [id number] DOB: 10/30/1975 Today's Date: 04/27/2023   History of Present Illness The pt is a 47 yo male presenting 11/25 with nausea, vomiting, and fever as well as R foot wound. 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    PT Comments  Pt received in supine, agreeable to therapy session after DME obtained in proper sizes for pt, spouse present and receptive to instruction on safe placement of hoyer lift pad and technique for transfer from bed>chair via hoyer pad with straps crossed under his legs. Once pad placed, pt reports chair in his room is too small and pad obtained (which is same size as previous pad used with maxi-sky lift) is too small, so PTA notified RN/secretary of pt needs for his room in order to have success in next session. HEP handout obtained for pt to work on strengthening/ROM in supine/bed postures and if seated upright. Pt needing up to +2 modA for rolling and reluctant to attempt unassisted due to anticipatory pain/bottom and thigh wounds. Pt will need XL clip hoyer sling and +2 geomat cushions brought to his room (they did not travel with him from previous room per pt/spouse) in order to attempt hoyer OOB next session or with nursing staff. When proper equipment not able to be found (pads/chair), pt defers to attempt posterior seated scoot transfer OOB to chair although PTA agreeable to place lift pad in chair for ease of return to bed. Pt continues to benefit from PT services to progress toward functional mobility goals.    If plan is discharge home, recommend the following: Two people to help with walking and/or transfers;Assist for transportation;Assistance with cooking/housework;Help with stairs or ramp for entrance   Can travel by private  vehicle     No  Equipment Recommendations  Hoyer lift;Wheelchair cushion (measurements PT);Wheelchair (measurements PT)    Recommendations for Other Services       Precautions / Restrictions Precautions Precautions: Fall;Other (comment) Precaution Comments: RLE limb protector Restrictions Weight Bearing Restrictions Per Provider Order: Yes RLE Weight Bearing Per Provider Order: Non weight bearing Other Position/Activity Restrictions: bilateral BKA     Mobility  Bed Mobility Overal bed mobility: Needs Assistance Bed Mobility: Rolling Rolling: Min assist, +2 for safety/equipment, Used rails  ModA +2 at times to roll due to fatigue       General bed mobility comments: increased time and with use of bed pad to assist, pt rolled to L/R sides x4 reps during bed mobility for lift and bed pad placement and removal of soiled bed pad after peri-care    Transfers                   General transfer comment: pt defers hoyer OOB to chair as incorrect chair size in room and pt states having lift pad crossed underneath his thighs was too painful in previous sessions. Unfortunately, this is only safe technique given that he has bilateral BKA    Ambulation/Gait               General Gait Details: unable   Stairs             Wheelchair Mobility     Tilt Bed    Modified Rankin (Stroke Patients Only)  Balance Overall balance assessment: Needs assistance Sitting-balance support: Bilateral upper extremity supported, Feet unsupported   Sitting balance - Comments: pt encouraged to attempt A/P transfer from bed>chair by therapist but he defers today                                    Cognition Arousal: Alert Behavior During Therapy: [facility] for tasks assessed/performed Overall Cognitive Status: Within Functional Limits for tasks assessed                                 General Comments: Patient able to notify PTA about  techniques that helped in previous transfers (in other room) and needs encouragement to initiate movements on his own more rather than relying on family/staff to move him dependently.        Exercises Amputee Exercises Quad Sets: AROM, Right, 5 reps, Supine Hip ABduction/ADduction: AAROM, Both, 5 reps, Supine Hip Flexion/Marching: AAROM, Both, 5 reps, Supine Other Exercises Other Exercises: HEP: Visit    [email or web address]    Access Code: [email or web address]    General Comments General comments (skin integrity, edema, etc.): VSS per chart review, no acute s/sx distress      Pertinent Vitals/Pain Pain Assessment Pain Assessment: Faces Faces Pain Scale: Hurts little more Pain Location: bottom with peri-care, RLE Pain Descriptors / Indicators: Grimacing, Discomfort, Guarding, Moaning Pain Intervention(s): Limited activity within patient's tolerance, Monitored during session, Repositioned    Home Living                          Prior Function            PT Goals (current goals can now be found in the care plan section) Acute Rehab PT Goals Patient Stated Goal: get bil prosthetics PT Goal Formulation: With patient Time For Goal Achievement: 04/24/23 Progress towards PT goals: Progressing toward goals    Frequency    Min 1X/week      PT Plan      Co-evaluation              AM-PAC PT "6 Clicks" Mobility   Outcome Measure  Help needed turning from your back to your side while in a flat bed without using bedrails?: A Lot Help needed moving from lying on your back to sitting on the side of a flat bed without using bedrails?: A Lot Help needed moving to and from a bed to a chair (including a wheelchair)?: Total Help needed standing up from a chair using your arms (e.g., wheelchair or bedside chair)?: Total Help needed to walk in hospital room?: Total Help needed climbing 3-5 steps with a railing? : Total 6 Click Score: 8    End of Session  Equipment Utilized During Treatment: Other (comment) (R limb protector) Activity Tolerance: No increased pain Patient left: in bed;with call bell/phone within reach;with bed alarm set;with family/visitor present Nurse Communication: Mobility status;Need for lift equipment;Other (comment) (pt needs x2 geomat cushions in order to get OOB to chair as per previous session) PT Visit Diagnosis: Other abnormalities of gait and mobility (R26.89);Muscle weakness (generalized) (M62.81);Pain Pain - part of body: Leg (scrotum/peri area)     Time: 1610-9604 PT Time Calculation (min) (ACUTE ONLY): 38 min  Charges:    $Therapeutic Exercise: 8-22 mins $Therapeutic Activity: 23-37 mins PT General Charges $$ ACUTE PT  VISIT: 1 Visit                     Will Heinkel P., PTA Acute Rehabilitation Services Secure Chat Preferred 9a-5:30pm Office: [phone]    Dorathy Kinsman [facility] 04/27/2023, 4:27 PM

## 2023-04-27 NOTE — TOC Progression Note (Signed)
Transition of Care (TOC) - Progression Note    Patient Details  Name: Alan Tapia. MRN: [id number] Date of Birth: June 20, 1975  Transition of Care [facility]) CM/SW Contact  Carley Hammed, LCSW Phone Number: 04/27/2023, 10:54 AM  Clinical Narrative:    CSW spoke with CM from [facility] [phone]). Aggie Cosier and CM discussed that [employer] is currently paying for hospitalization, but will not be active for much longer. CSW advised that disability will need to be approved for SNF, or DSS is working on appropriate housing for [facility]. Aggie Cosier to reach out to her team to identify if they can assist with either disposition. CSW spoke with Hale Bogus from [facility] who noted she updated family, but currently no movement on either disposition. TOC will continue to follow.    Expected Discharge Plan: Skilled Nursing Facility Barriers to Discharge: Insurance Authorization, Continued Medical Work up, SNF Pending bed offer  Expected Discharge Plan and Services In-house Referral: Clinical Social Work     Living arrangements for the past 2 months: Single Family Home Expected Discharge Date: 04/11/23                                     Social Determinants of Health (SDOH) Interventions SDOH Screenings   Food Insecurity: No Food Insecurity (04/07/2023)  Housing: Unknown (04/23/2023)  Recent Concern: Housing - Medium Risk (04/07/2023)  Transportation Needs: No Transportation Needs (04/07/2023)  Recent Concern: Transportation Needs - Unmet Transportation Needs (02/17/2023)   Received from [facility]  Utilities: At Risk (04/07/2023)  Alcohol Screen: Low Risk  (08/20/2022)  Financial Resource Strain: Patient Unable To Answer (08/20/2022)  Tobacco Use: Low Risk  (04/08/2023)    Readmission Risk Interventions    04/07/2023    2:34 PM  Readmission Risk Prevention Plan  Transportation Screening Complete  PCP or Specialist Appt within 3-5 Days Complete  HRI or Home Care Consult Complete  Social  Work Consult for Recovery Care Planning/Counseling Complete  Palliative Care Screening Not Applicable  Medication Review Oceanographer) Complete

## 2023-04-28 ENCOUNTER — Encounter: Payer: Medicaid Other | Admitting: Physician Assistant

## 2023-04-28 ENCOUNTER — Telehealth: Payer: Self-pay | Admitting: Orthopedic Surgery

## 2023-04-28 DIAGNOSIS — A419 Sepsis, unspecified organism: Secondary | ICD-10-CM | POA: Diagnosis not present

## 2023-04-28 DIAGNOSIS — R6520 Severe sepsis without septic shock: Secondary | ICD-10-CM | POA: Diagnosis not present

## 2023-04-28 DIAGNOSIS — N179 Acute kidney failure, unspecified: Secondary | ICD-10-CM | POA: Diagnosis not present

## 2023-04-28 LAB — BASIC METABOLIC PANEL
Anion gap: 5 (ref 5–15)
BUN: 8 mg/dL (ref 6–20)
CO2: 24 mmol/L (ref 22–32)
Calcium: 7.5 mg/dL — ABNORMAL LOW (ref 8.9–10.3)
Chloride: 110 mmol/L (ref 98–111)
Creatinine, Ser: 1.15 mg/dL (ref 0.61–1.24)
GFR, Estimated: >60 mL/min (ref >60)
Glucose, Bld: 112 mg/dL — ABNORMAL HIGH (ref 70–99)
Potassium: 4 mmol/L (ref 3.5–5.1)
Sodium: 139 mmol/L (ref 135–145)

## 2023-04-28 LAB — GLUCOSE, CAPILLARY
Glucose-Capillary: 94 mg/dL (ref 70–99)
Glucose-Capillary: 92 mg/dL (ref 70–99)
Glucose-Capillary: 167 mg/dL — ABNORMAL HIGH (ref 70–99)

## 2023-04-28 MED ORDER — FUROSEMIDE 10 MG/ML IJ SOLN
40.0000 mg | Freq: Every day | INTRAMUSCULAR | Status: AC
  Administered 2023-04-29: 40 mg via INTRAVENOUS
  Filled 2023-04-28: qty 4

## 2023-04-28 NOTE — Plan of Care (Signed)
  Problem: Fluid Volume: Goal: Hemodynamic stability will improve Outcome: Progressing   Problem: Clinical Measurements: Goal: Signs and symptoms of infection will decrease Outcome: Progressing   Problem: Education: Goal: Knowledge of General Education information will improve Description: Including pain rating scale, medication(s)/side effects and non-pharmacologic comfort measures Outcome: Progressing   Problem: Clinical Measurements: Goal: Respiratory complications will improve Outcome: Progressing   Problem: Nutrition: Goal: Adequate nutrition will be maintained Outcome: Progressing

## 2023-04-28 NOTE — Hospital Course (Addendum)
47 year old male with past medical history significant for left lower extremity BKA, dilated cardiomyopathy, history of combined systolic and diastolic heart failure with EF 35 to 40%, hypertension, insulin-dependent diabetes, peripheral neuropathy, hyperlipidemia who presented to the emergency department on 04/06/2023 with complaints of odor, wound of his foot. He was also having nausea, vomiting and diarrhea. Orthopedic surgery was consulted and patient underwent right BKA 11/27 by Dr. Lajoyce Corners.  HPI: Alan Tapia. is a 47 y.o. 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  Patient reported he has significant diabetic foot infection.   During my evaluation at the bedside patient reported that he has been admitted in the [facility] in August 2024 when he had left-sided foot infection and since he has been discharged from the hospital unfortunately he lost follow-up with primary care doctor as well as lost follow-up with wound care clinic as well.  He has not has got any refill of any blood pressure regimen and insulin for last 4 months.   Patient reporting that he has ongoing nausea, vomiting and diarrhea for last 1 month.  Also patient noticed right-sided heel wound with malodorous discharge for last 2 weeks.  Patient denies any foot pain.  Denies any fever and chills at home.  Denies any abdominal pain. Patient denies chest pain, orthopnea, PND, dyspnea, headache, change of appetite retroportal and constipation.   Antibiotic Therapy: Anti-infectives (From admission, onward)    Start     Dose/Rate Route Frequency Ordered  Stop   04/30/23 1800  nirmatrelvir/ritonavir (PAXLOVID) 3 tablet        3 tablet Oral 2 times daily 04/30/23 1621 05/05/23 0820   04/09/23 1345  doxycycline (VIBRAMYCIN) 100 mg in dextrose 5 % 250 mL IVPB        100 mg 125 mL/hr over 120 Minutes Intravenous Every 12 hours 04/09/23 1252 04/09/23 1746   04/08/23 1300  levofloxacin (LEVAQUIN) IVPB 500 mg  Status:  Discontinued        500 mg 100 mL/hr over 60 Minutes Intravenous On call to O.R. 04/08/23 1229 04/08/23 1648   04/08/23 1300  vancomycin (VANCOREADY) IVPB 1500 mg/300 mL        1,500 mg 100 mL/hr over 180 Minutes Intravenous On call to O.R. 04/08/23 1229 04/08/23 1433   04/08/23 1237  vancomycin HCl (VANCOREADY) 1500 MG/300ML IVPB       Note to Pharmacy: [name] D: cabinet override      04/08/23 1237 04/08/23 1439   04/07/23 0800  piperacillin-tazobactam (ZOSYN) IVPB 3.375 g        3.375 g 12.5 mL/hr over 240 Minutes Intravenous Every 8 hours 04/07/23 0108 04/09/23 2108   04/07/23 0100  doxycycline (VIBRAMYCIN) 100 mg in dextrose 5 % 250 mL IVPB  Status:  Discontinued        100 mg 125 mL/hr over 120 Minutes Intravenous Every 12 hours 04/07/23 0057 04/09/23 1252   04/07/23 0000  ceFEPIme (MAXIPIME) 2 g in sodium chloride 0.9 % 100 mL IVPB  Status:  Discontinued        2 g 200 mL/hr over 30 Minutes Intravenous Every 8 hours 04/06/23 2349 04/07/23 0108   04/06/23 2330  metroNIDAZOLE (FLAGYL) IVPB 500 mg  500 mg 100 mL/hr over 60 Minutes Intravenous  Once 04/06/23 2321 04/07/23 0032   04/06/23 2300  piperacillin-tazobactam (ZOSYN) IVPB 3.375 g  Status:  Discontinued        3.375 g 100 mL/hr over 30 Minutes Intravenous  Once 04/06/23 2257 04/06/23 2320   04/06/23 2300  vancomycin (VANCOREADY) IVPB 2000 mg/400 mL  Status:  Discontinued        2,000 mg 200 mL/hr over 120 Minutes Intravenous  Once 04/06/23 2257 04/06/23 2312       Procedures: 04-08-2023 Right BKA  Consultants: Orthopedics - Lajoyce Corners

## 2023-04-28 NOTE — Progress Notes (Signed)
Patient currently admitted in Hospital. Message sent for him to discuss with Nursing staff and Providers there about his concerns. Provider awaited 10 minutes for patient to arrive for visit, but they did not show. Will mark erroneous since patient is currently admitted.    This encounter was created in error - please disregard.

## 2023-04-28 NOTE — Plan of Care (Signed)
  Problem: Respiratory: Goal: Ability to maintain adequate ventilation will improve Outcome: Progressing   Problem: Health Behavior/Discharge Planning: Goal: Ability to manage health-related needs will improve Outcome: Progressing   Problem: Nutrition: Goal: Adequate nutrition will be maintained Outcome: Progressing   Problem: Elimination: Goal: Will not experience complications related to bowel motility Outcome: Progressing   Problem: Skin Integrity: Goal: Risk for impaired skin integrity will decrease Outcome: Progressing   Problem: Nutrition: Goal: Adequate nutrition will be maintained Outcome: Progressing   Problem: Elimination: Goal: Will not experience complications related to bowel motility Outcome: Progressing

## 2023-04-28 NOTE — Progress Notes (Addendum)
Nutrition Follow-up  DOCUMENTATION CODES:   Non-severe (moderate) malnutrition in context of chronic illness  INTERVENTION:   - Continue Regular diet with double proteins - Mighty Shake TID with meals, each supplement provides 330 kcals and 9 grams of protein - Ensure Enlive po BID, each supplement provides 350 kcal and 20 grams of protein. - Continue Prosource Plus BID and snacks daily  - Continue MVI with minerals, folic acid, and Vitamin C daily - If pt continues to have loose stools recommend adding Banatrol BID   NUTRITION DIAGNOSIS:   Moderate Malnutrition related to chronic illness as evidenced by energy intake < or equal to 75% for > or equal to 1 month, moderate fat depletion, moderate muscle depletion.  - Still applicable   GOAL:   Patient will meet greater than or equal to 90% of their needs   MONITOR:   PO intake, Supplement acceptance, Weight trends  REASON FOR ASSESSMENT:   Consult Assessment of nutrition requirement/status, Calorie Count  ASSESSMENT:   47 y.o. M presented  from home with N/V/D x 2 weeks and right foot wound, admitted with sepsis. PMH; type 2 diabetes, CHF, BKA of left lower extremity and ongoing diabetic foot infection, s/p RBKA.  4/5 - LBKA 11/27- RBKA 12/7 - Reglan started   Pt was feeling down on visit. He states he has been "eating good." On visit pt ate around 60% of his breakfast. He reports having gas and pain when drinking his Ensures offered [facility] but pt wanted to stick with Ensures. He states he is drinking 2 Ensures per day. Pt has mostly been taking his Prosource twice per day.  Pt reports he is sensitive to dairy. Per [facility] pt orders dairy based items which may be the cause of his discomfort. No N/V noted. Bowel movements have been loose.   Pt states he has not been receiving his snacks, made LPN aware.   Discussed with pt the importance of protein with wound healing and overall health. Encouraged pt to drink his  supplements and order a protein with every meal.   Admit weight: 144.8 kg  Current weight: 137.8 kg    Average Meal Intake: 12/11-12/16: 96% intake x 8 recorded meals  Nutritionally Relevant Medications: Scheduled Meds:  (feeding supplement) PROSource Plus  30 mL Oral BID BM   vitamin C  1,000 mg Oral Daily   docusate sodium  100 mg Oral BID   feeding supplement  237 mL Oral BID BM   folic acid  1 mg Oral Daily   insulin aspart  0-9 Units Subcutaneous TID WC   metoCLOPramide (REGLAN) injection  5 mg Intravenous Q6H   metoprolol tartrate  50 mg Oral BID   multivitamin with minerals  1 tablet Oral Daily   Labs Reviewed: Calcium 7.5 CBG ranges from 92-171 mg/dL over the last 24 hours   Diet Order:   Diet Order             Diet regular Room service appropriate? Yes; Fluid consistency: Thin  Diet effective now           Diet - low sodium heart healthy                   EDUCATION NEEDS:   Education needs have been addressed  Skin:  Skin Assessment: Reviewed RN Assessment Skin Integrity Issues:: Stage II, Incisions, Wound VAC Stage II: [place] Wound Vac: R knee Incisions: R knee  Last BM:  12/16 - type 5  Height:   Ht Readings from Last 1 Encounters:  04/08/23 6\' 4"  (1.93 m)    Weight:   Wt Readings from Last 1 Encounters:  04/28/23 (!) 137.8 kg    Ideal Body Weight:  79.16 kg  BMI:  Body mass index is 36.98 kg/m.  Estimated Nutritional Needs:   Kcal:  2450- 2800 kcal/d  Protein:  140-160 gm  Fluid:  >2.2L   Elliot Dally, RD Registered Dietitian  See Amion for more information

## 2023-04-28 NOTE — TOC Progression Note (Addendum)
Transition of Care (TOC) - Progression Note    Patient Details  Name: Alan Tapia. MRN: [id number] Date of Birth: [birth year]  Transition of Care [facility]) CM/SW Contact  Carley Hammed, LCSW Phone Number: 04/28/2023, 1:45 PM  Clinical Narrative:    CSW spoke with pt's spouse and received an email from [place] with DSS that back rent has been paid at this time. Spouse noting that her Section 8 can now be used to find an accessible apartment but she is being told that openings will be available after the new year. Spouse notes that pt's hospital bed is broken and that pt has manual wheelchair at home. Case discussed with CM as well.  Per documentation spouse followed up with Ortho asking when pt would be discharged. Patient is medically cleared to DC but pt and family are reporting he does not have a safe or accessible place to DC to. DSS reporting disability has not been approved yet so SNF is still not an option. Spouse states she is working on getting pt a PCP. Per documentation, spouse reached out to CIR as well, this is also not an option. TOC continues to follow to assist with DC needs.   3:30 CSW received a call from [place] with DSS. She received a call from pt's mother who stated pt told her he was discharging on Friday, and that pt's spouse was in the ED with pneumonia.  CSW met with pt at bedside and pt stated he told his mom that doctors were signing off on Friday. Pt's spouse was in CSW's office a little while ago. Pt states the plan continues to be home w/ HH and there is currently no safe DC at this time. TOC continues to follow.  Expected Discharge Plan: Skilled Nursing Facility Barriers to Discharge: Insurance Authorization, Continued Medical Work up, SNF Pending bed offer  Expected Discharge Plan and Services In-house Referral: Clinical Social Work     Living arrangements for the past 2 months: Single Family Home Expected Discharge Date: 04/11/23                                      Social Determinants of Health (SDOH) Interventions SDOH Screenings   Food Insecurity: No Food Insecurity (04/07/2023)  Housing: Unknown (04/23/2023)  Recent Concern: Housing - Medium Risk (04/07/2023)  Transportation Needs: No Transportation Needs (04/07/2023)  Recent Concern: Transportation Needs - Unmet Transportation Needs (02/17/2023)   Received from [facility]  Utilities: At Risk (04/07/2023)  Alcohol Screen: Low Risk  (08/20/2022)  Financial Resource Strain: Patient Unable To Answer (08/20/2022)  Tobacco Use: Low Risk  (04/08/2023)    Readmission Risk Interventions    04/07/2023    2:34 PM  Readmission Risk Prevention Plan  Transportation Screening Complete  PCP or Specialist Appt within 3-5 Days Complete  HRI or Home Care Consult Complete  Social Work Consult for Recovery Care Planning/Counseling Complete  Palliative Care Screening Not Applicable  Medication Review Oceanographer) Complete

## 2023-04-28 NOTE — Telephone Encounter (Signed)
Patient's wife  [name] called advised patient has not been discharges yet, [name] asked when will patient be discharges from the hospital? [name] also asked if patient will be having (PT) at [facility]? The number to contact [name] is [phone]

## 2023-04-28 NOTE — TOC CM/SW Note (Addendum)
Wife states patient has a hospital bed through [facility] and the motor is not working. NCM called Mitch with [facility] will will alert office .   Of course NCM will need an address to arrange home health services. Advised wife if NCM finds an accepting agency , medicaid only covers 2 to 3 HHPT visits. Medicaid does cover OP PT.   Patient will need a PCP to follow patient for home health services or OP services. Wife aware and working on finding a PCP. Advised she can call the number on insurance card and be provided a complete list of providers in network. NCM can arrange appointment at a [facility] clinic . Wife decided to call insurance company and get a list. Can also  ask Dr Lajoyce Corners if he will be willing to follow until patient is seen by a PCP . Will wait until NCM has an address and accepting agency to ask.   Asked Tresa Endo with [facility] if they accept patient's insurance for HHPT , they do not .  Cory with [facility] cannot accept patient's insurance for HHPT   Amy with [facility] is out of network with patient's insurance.   Calvin with [facility] cannot accept referral , they are not in network with patient's insurance .   Lynette with [facility] unable to accept due to patient's insurance    Marylene Land with [facility] does not have a Community education officer with [employer] with [facility] cannot accept referral due to insurance     Artavia with [facility] cannot accept referral due to insurance.  Once discharge address known , will see if there are other home health agencies to call. If NCM unable to find an accepting home health agency , can arrange OP PT.  Patient can use Medicaid transportation    Called [facility] to inquire about services they offer Medicaid [employer] patients, awaiting call back.

## 2023-04-28 NOTE — Telephone Encounter (Signed)
Please see this message below. The pt is working with Child psychotherapist at the hospital on placement. Pending disability so he can not go to SNF as of this moment and the family states that the home is not safe foe him to return to. All of which is documented in the chart. The pt is medically sound but barriers to d/c are safety in the home. PT would not start until after prosthetic so I am not sure how to advise. Please see below. Where you planning on rounding on this pt tomorrow?

## 2023-04-28 NOTE — Progress Notes (Signed)
Pt refuse furosemide. He stated, "Lasix swells him up and that's how he ended up in the hospital with his right foot swelling." I notified Dr. Lewie Chamber.

## 2023-04-28 NOTE — Progress Notes (Signed)
Orthopedic Tech Progress Note Patient Details:  Alan Tapia. [birth year] [id number] Called [facility] to place an order for his BK shrinker. Patient ID: [name], male   DOB: 1976/04/30, 47 y.o.   MRN: [id number]  [name] 04/28/2023, 12:25 PM

## 2023-04-28 NOTE — Progress Notes (Signed)
   04/28/23 1600  Mobility  Activity Transferred from chair to bed  Level of Assistance +2 (takes two people)  Assistive Device [employer]  RLE Weight Bearing Per Provider Order NWB  Activity Response Tolerated fair  Mobility Referral Yes  Mobility visit 1 Mobility  Mobility Specialist Start Time (ACUTE ONLY) 1530  Mobility Specialist Stop Time (ACUTE ONLY) 1555  Mobility Specialist Time Calculation (min) (ACUTE ONLY) 25 min   Mobility Specialist: Progress Note  Pt agreeable to mobility session - received in chair. Required +2 using Maximove. C/o mid back pain. Returned to bed with all needs met - call bell within reach.   Barnie Mort, BS Mobility Specialist Please contact via SecureChat or Rehab office at [phone].

## 2023-04-28 NOTE — Progress Notes (Signed)
Occupational Therapy Treatment Patient Details Name: Alan Tapia. MRN: [id number] DOB: 1975-07-30 Today's Date: 04/28/2023   History of present illness The pt is a 47 yo male presenting 11/25 with nausea, vomiting, and fever as well as R foot wound. 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   OT comments  With encouragement, pt agreeable for transfer OOB to chair via maximove. Emphasis on modifications to maximize comfort with lift pad, bed mobility techniques and reinforcement of therapeutic activities outside of therapy sessions. Pt's significant other unfortunately not present due to not feeling well. Will continue to collaborate acutely for caregiver education, ADL independence and gradual independence with transfers.       If plan is discharge home, recommend the following:  Two people to help with walking and/or transfers;Two people to help with bathing/dressing/bathroom;Assistance with cooking/housework;Assist for transportation;Help with stairs or ramp for entrance   Equipment Recommendations  Hoyer lift    Recommendations for Other Services      Precautions / Restrictions Precautions Precautions: Fall;Other (comment) Precaution Comments: RLE limb protector, R wound vac, skin breakdown in perineum region Restrictions Weight Bearing Restrictions Per Provider Order: Yes RLE Weight Bearing Per Provider Order: Non weight bearing Other Position/Activity Restrictions: bilateral BKA       Mobility Bed Mobility Overal bed mobility: Needs Assistance Bed Mobility: Rolling Rolling: Min assist, Mod assist, +2 for safety/equipment         General bed mobility comments: Mod A x 2 for safety to roll to L side, improvements noted with Min A x 2 for sfety to roll to R side. assisted for cleanup and placement of lift  pad    Transfers Overall transfer level: Needs assistance Equipment used: Ambulation equipment used Transfers: Bed to chair/wheelchair/BSC               Transfer via Lift Equipment: Maximove   Balance                                           ADL either performed or assessed with clinical judgement   ADL                               Toileting- Clothing Manipulation and Hygiene: Total assistance;Bed level Toileting - Clothing Manipulation Details (indicate cue type and reason): for cleanup after small bowel incontinence       General ADL Comments: Focused on OOB transfer with maximove w/ modifications to maximize comfort    Extremity/Trunk Assessment Upper Extremity Assessment Upper Extremity Assessment: Overall WFL for tasks assessed;Right hand dominant   Lower Extremity Assessment Lower Extremity Assessment: Defer to PT evaluation        Vision   Vision Assessment?: No apparent visual deficits   Perception     Praxis      Cognition Arousal: Alert Behavior During Therapy: WFL for tasks assessed/performed Overall Cognitive Status: Within Functional Limits for tasks assessed                                          Exercises  Shoulder Instructions       General Comments Pt's significant other seen in hallway prior to therapy session; reported plan to go to ED d/t concerns of PNA- not present during session    Pertinent Vitals/ Pain       Pain Assessment Pain Assessment: Faces Faces Pain Scale: Hurts a little bit Pain Location: scrotum Pain Descriptors / Indicators: Discomfort Pain Intervention(s): Monitored during session, Repositioned  Home Living                                          Prior Functioning/Environment              Frequency  Min 1X/week        Progress Toward Goals  OT Goals(current goals can now be found in the care plan section)  Progress  towards OT goals: Progressing toward goals  Acute Rehab OT Goals Patient Stated Goal: minimize discomfort with movement OT Goal Formulation: With patient Time For Goal Achievement: 05/12/23 Potential to Achieve Goals: Good  Plan      Co-evaluation                 AM-PAC OT "6 Clicks" Daily Activity     Outcome Measure   Help from another person eating meals?: A Little Help from another person taking care of personal grooming?: A Little Help from another person toileting, which includes using toliet, bedpan, or urinal?: Total Help from another person bathing (including washing, rinsing, drying)?: A Lot Help from another person to put on and taking off regular upper body clothing?: A Little Help from another person to put on and taking off regular lower body clothing?: Total 6 Click Score: 13    End of Session    OT Visit Diagnosis: Other abnormalities of gait and mobility (R26.89);Muscle weakness (generalized) (M62.81);Other (comment)   Activity Tolerance Patient tolerated treatment well   Patient Left in chair;with call bell/phone within reach   Nurse Communication Mobility status;Need for lift equipment        Time: 1339-1415 OT Time Calculation (min): 36 min  Charges: OT General Charges $OT Visit: 1 Visit OT Treatments $Therapeutic Activity: 23-37 mins  Bradd Canary, OTR/L Acute Rehab Services Office: [phone]   Lorre Munroe 04/28/2023, 2:34 PM

## 2023-04-28 NOTE — Progress Notes (Signed)
Progress Note    Alan Tapia.   Alan Tapia  DOB: 09-06-1975  DOA: 04/06/2023     21 PCP: Patient, No Pcp Per  Initial CC: right foot wound  Hospital Course: 47 year old male with past medical history significant for left lower extremity BKA, dilated cardiomyopathy, history of combined systolic and diastolic heart failure with EF 35 to 40%, hypertension, insulin-dependent diabetes, peripheral neuropathy, hyperlipidemia who presented to the emergency department on 04/06/2023 with complaints of odor, wound of his foot. He was also having nausea, vomiting and diarrhea. Orthopedic surgery was consulted and patient underwent right BKA 11/27 by Dr. Lajoyce Tapia.  Interval History:  Wife present bedside. Patient resting comfortably. Dispo still being addressed.   Assessment and Plan:  Sepsis sec to Non healing right foot ulcer Previous history of left BKA -Status post right BKA 11/27 -Blood cultures negative. - Dr. Lajoyce Tapia aware patient still in hospital; tentative plan to see 12/18 - WV remains in place to RLE  Dyspnea - CXR showing some volume overload - short lasix trial; trend BMP on lasix   Microcytic anemia FOBT negative. S/p PRBC transfusions this hospitalization.  Combination of iron def anemia and anemia of chronic disease.   Hypertension Well controlled.  Continue with metoprolol 50 mg BID.    Type 2 DM with hyperglycemia SSI.  A1c IS 6.8% no changes in meds.    AKI - resolved  - renal u/s normal   Vomiting suspect from gastroparesis. - resolved - d/c reglan; having multiple BM now       RN Pressure Injury Documentation:     Pressure Injury 04/13/23 Perineum Bilateral Stage 2 -  Partial thickness loss of dermis presenting as a shallow open injury with a red, pink wound bed without slough. perineum/scrotum masd on admission (Active)  04/13/23 1100  Location: Perineum  Location Orientation: Bilateral  Staging: Stage 2 -  Partial thickness loss of dermis  presenting as a shallow open injury with a red, pink wound bed without slough.  Wound Description (Comments): perineum/scrotum masd on admission  Present on Admission:   Dressing Type Negative pressure wound therapy 04/24/23 1300      Malnutrition Type:   Nutrition Problem: Moderate Malnutrition Etiology: chronic illness     Malnutrition Characteristics:   Signs/Symptoms: energy intake < or equal to 75% for > or equal to 1 month, moderate fat depletion, moderate muscle depletion     Nutrition Interventions:   Interventions: MVI, Magic cup   Estimated body mass index is 36.82 kg/m as calculated from the following:   Height as of this encounter: 6\' 4"  (1.93 m).   Weight as of this encounter: 137.2 kg.    Old records reviewed in assessment of this patient  DVT prophylaxis:  enoxaparin (LOVENOX) injection 40 mg Start: 04/15/23 2000 SCD's Start: 04/08/23 1658 SCDs Start: 04/07/23 0104   Code Status:   Code Status: Full Code  Mobility Assessment (Last 72 Hours)     Mobility Assessment     Row Name 04/28/23 1400 04/27/23 2300 04/27/23 1600 04/27/23 1537 04/27/23 1000   Does patient have an order for bedrest or is patient medically unstable -- No - Continue assessment -- -- No - Continue assessment   What is the highest level of mobility based on the progressive mobility assessment? Level 2 (Chairfast) - Balance while sitting on edge of bed and cannot stand Level 2 (Chairfast) - Balance while sitting on edge of bed and cannot stand Level 2 (Chairfast) - Balance while sitting  on edge of bed and cannot stand Level 2 (Chairfast) - Balance while sitting on edge of bed and cannot stand Level 2 (Chairfast) - Balance while sitting on edge of bed and cannot stand   Is the above level different from baseline mobility prior to current illness? -- Yes - Recommend PT order -- -- Yes - Recommend PT order    Row Name 04/27/23 0817 04/26/23 1700         Does patient have an order for  bedrest or is patient medically unstable No - Continue assessment No - Continue assessment      What is the highest level of mobility based on the progressive mobility assessment? Level 2 (Chairfast) - Balance while sitting on edge of bed and cannot stand Level 2 (Chairfast) - Balance while sitting on edge of bed and cannot stand      Is the above level different from baseline mobility prior to current illness? Yes - Recommend PT order Yes - Recommend PT order               Barriers to discharge: unsafe d/c Disposition Plan:  U/K Status is: Inpt  Objective: Blood pressure 139/73, pulse 74, temperature 98 F (36.7 C), temperature source Oral, resp. rate 17, height 6\' 4"  (1.93 m), weight (!) 137.8 kg, SpO2 97%.  Examination:  Physical Exam Constitutional:      General: He is not in acute distress.    Appearance: Normal appearance.  HENT:     Head: Normocephalic and atraumatic.     Mouth/Throat:     Mouth: Mucous membranes are moist.  Eyes:     Extraocular Movements: Extraocular movements intact.  Cardiovascular:     Rate and Rhythm: Normal rate and regular rhythm.  Pulmonary:     Effort: Pulmonary effort is normal. No respiratory distress.     Breath sounds: Normal breath sounds. No wheezing.  Abdominal:     General: Bowel sounds are normal. There is no distension.     Palpations: Abdomen is soft.     Tenderness: There is no abdominal tenderness.  Musculoskeletal:     Cervical back: Normal range of motion and neck supple.     Comments: B/L BKA noted; WV noted on R BKA  Skin:    General: Skin is warm and dry.  Neurological:     General: No focal deficit present.     Mental Status: He is alert.  Psychiatric:        Mood and Affect: Mood normal.        Behavior: Behavior normal.      Consultants:  Orthopedic surgery  Procedures:  11/27: R BKA  Data Reviewed: Results for orders placed or performed during the hospital encounter of 04/06/23 (from the past 24 hours)   Glucose, capillary     Status: Abnormal   Collection Time: 04/27/23  5:09 PM  Result Value Ref Range   Glucose-Capillary 132 (H) 70 - 99 mg/dL  Basic metabolic panel     Status: Abnormal   Collection Time: 04/27/23  6:07 PM  Result Value Ref Range   Sodium 139 135 - 145 mmol/L   Potassium 4.3 3.5 - 5.1 mmol/L   Chloride 108 98 - 111 mmol/L   CO2 26 22 - 32 mmol/L   Glucose, Bld 124 (H) 70 - 99 mg/dL   BUN 6 6 - 20 mg/dL   Creatinine, Ser 1.61 0.61 - 1.24 mg/dL   Calcium 7.5 (L) 8.9 - 10.3 mg/dL  GFR, Estimated >60 >60 mL/min   Anion gap 5 5 - 15  Glucose, capillary     Status: Abnormal   Collection Time: 04/27/23  9:22 PM  Result Value Ref Range   Glucose-Capillary 171 (H) 70 - 99 mg/dL  Basic metabolic panel     Status: Abnormal   Collection Time: 04/28/23  6:21 AM  Result Value Ref Range   Sodium 139 135 - 145 mmol/L   Potassium 4.0 3.5 - 5.1 mmol/L   Chloride 110 98 - 111 mmol/L   CO2 24 22 - 32 mmol/L   Glucose, Bld 112 (H) 70 - 99 mg/dL   BUN 8 6 - 20 mg/dL   Creatinine, Ser 0.86 0.61 - 1.24 mg/dL   Calcium 7.5 (L) 8.9 - 10.3 mg/dL   GFR, Estimated >57 >84 mL/min   Anion gap 5 5 - 15  Glucose, capillary     Status: None   Collection Time: 04/28/23  8:37 AM  Result Value Ref Range   Glucose-Capillary 94 70 - 99 mg/dL  Glucose, capillary     Status: None   Collection Time: 04/28/23 12:59 PM  Result Value Ref Range   Glucose-Capillary 92 70 - 99 mg/dL    I have reviewed pertinent nursing notes, vitals, labs, and images as necessary. I have ordered labwork to follow up on as indicated.  I have reviewed the last notes from staff over past 24 hours. I have discussed patient's care plan and test results with nursing staff, CM/SW, and other staff as appropriate.  Time spent: Greater than 50% of the 55 minute visit was spent in counseling/coordination of care for the patient as laid out in the A&P.   LOS: 21 days   Lewie Chamber, MD [facility] 04/28/2023,  2:38 PM

## 2023-04-28 NOTE — Progress Notes (Signed)
Inpatient Rehabilitation Admissions Coordinator   I received a call from patient's wife to inquire into CIR level rehab. I explained that he was not at a level to tolerate the intensity required, His BCBS was with [facility] through the [facility] that we are not contracted with and that therapy is recommending SNF. We are not pursuing CIR admit.  Ottie Glazier, RN, MSN Rehab Admissions Coordinator [phone] 04/28/2023 9:53 AM

## 2023-04-29 DIAGNOSIS — R6520 Severe sepsis without septic shock: Secondary | ICD-10-CM | POA: Diagnosis not present

## 2023-04-29 DIAGNOSIS — N179 Acute kidney failure, unspecified: Secondary | ICD-10-CM | POA: Diagnosis not present

## 2023-04-29 DIAGNOSIS — A419 Sepsis, unspecified organism: Secondary | ICD-10-CM | POA: Diagnosis not present

## 2023-04-29 LAB — GLUCOSE, CAPILLARY
Glucose-Capillary: 96 mg/dL (ref 70–99)
Glucose-Capillary: 101 mg/dL — ABNORMAL HIGH (ref 70–99)
Glucose-Capillary: 114 mg/dL — ABNORMAL HIGH (ref 70–99)
Glucose-Capillary: 119 mg/dL — ABNORMAL HIGH (ref 70–99)

## 2023-04-29 NOTE — Plan of Care (Signed)
  Problem: Clinical Measurements: Goal: Ability to maintain clinical measurements within normal limits will improve Outcome: Progressing Goal: Will remain free from infection Outcome: Progressing Goal: Respiratory complications will improve Outcome: Progressing Goal: Cardiovascular complication will be avoided Outcome: Progressing   Problem: Activity: Goal: Risk for activity intolerance will decrease Outcome: Progressing   Problem: Nutrition: Goal: Adequate nutrition will be maintained Outcome: Progressing   Problem: Elimination: Goal: Will not experience complications related to bowel motility Outcome: Progressing Goal: Will not experience complications related to urinary retention Outcome: Progressing   Problem: Pain Management: Goal: General experience of comfort will improve Outcome: Progressing   Problem: Safety: Goal: Ability to remain free from injury will improve Outcome: Progressing   Problem: Skin Integrity: Goal: Risk for impaired skin integrity will decrease Outcome: Progressing   Problem: Activity: Goal: Ability to tolerate increased activity will improve Outcome: Progressing   Problem: Clinical Measurements: Goal: Ability to maintain a body temperature in the normal range will improve Outcome: Progressing   Problem: Respiratory: Goal: Ability to maintain adequate ventilation will improve Outcome: Progressing Goal: Ability to maintain a clear airway will improve Outcome: Progressing   Problem: Education: Goal: Knowledge of the prescribed therapeutic regimen will improve Outcome: Progressing Goal: Ability to verbalize activity precautions or restrictions will improve Outcome: Progressing Goal: Understanding of discharge needs will improve Outcome: Progressing   Problem: Activity: Goal: Ability to perform//tolerate increased activity and mobilize with assistive devices will improve Outcome: Progressing   Problem: Clinical Measurements: Goal:  Postoperative complications will be avoided or minimized Outcome: Progressing   Problem: Self-Care: Goal: Ability to meet self-care needs will improve Outcome: Progressing   Problem: Education: Goal: Knowledge of General Education information will improve Description: Including pain rating scale, medication(s)/side effects and non-pharmacologic comfort measures Outcome: Progressing   Problem: Clinical Measurements: Goal: Ability to maintain clinical measurements within normal limits will improve Outcome: Progressing Goal: Will remain free from infection Outcome: Progressing Goal: Diagnostic test results will improve Outcome: Progressing Goal: Respiratory complications will improve Outcome: Progressing   Problem: Activity: Goal: Risk for activity intolerance will decrease Outcome: Progressing   Problem: Nutrition: Goal: Adequate nutrition will be maintained Outcome: Progressing   Problem: Coping: Goal: Level of anxiety will decrease Outcome: Progressing   Problem: Elimination: Goal: Will not experience complications related to bowel motility Outcome: Progressing Goal: Will not experience complications related to urinary retention Outcome: Progressing   Problem: Pain Management: Goal: General experience of comfort will improve Outcome: Progressing   Problem: Safety: Goal: Ability to remain free from injury will improve Outcome: Progressing   Problem: Skin Integrity: Goal: Risk for impaired skin integrity will decrease Outcome: Progressing

## 2023-04-29 NOTE — Progress Notes (Signed)
Patient ID: Alan Tapia., male   DOB: May 08, 1976, 47 y.o.   MRN: [id number] I remove the wound VAC dressing.  The residual limb is well consolidated the wound edges are well-approximated there is no ischemic changes no drainage.  Patient may begin dry dressing changes with the stump shrinker on top.  Patient is planning for discharge to home.  I will follow-up in the office.
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
## 2023-04-29 NOTE — Progress Notes (Signed)
Progress Note    Alan Tapia.   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 He was also having nausea, vomiting and diarrhea. Orthopedic surgery was consulted and patient underwent right BKA 11/27 by Dr. Lajoyce Corners.  Interval History:  Wife present bedside. Patient resting comfortably. Dispo still being addressed.  WV removed by Dr. Lajoyce Corners this morning.   Assessment and Plan:  Sepsis sec to Non healing right foot ulcer Previous history of left BKA -Status post right BKA 11/27 -Blood cultures negative. - Dr. Lajoyce Corners has seen again  on 12/18; WV removed; okay for dry dressing changes with stump shrinker on top  Dyspnea - CXR showing some volume overload - short lasix trial; trend BMP on lasix  - Patient declined Lasix stating it "makes him swell"  Social issues - very complex dispo issue, but CM/SW are working hard at helping figure out dispo plans; also involved are DSS and others; patient home not safe for current needs and he'll need accessible housing; also still looking for [facility] which has been a barrier  Microcytic anemia FOBT negative. S/p PRBC transfusions this hospitalization.  Combination of iron def anemia and anemia of chronic disease.   Hypertension Well controlled.  Continue with metoprolol 50 mg BID.    Type 2 DM with hyperglycemia SSI.  A1c IS 6.8% no changes in meds.    AKI - resolved  - renal u/s normal   Vomiting suspect from gastroparesis. - resolved - d/c reglan; having multiple BM now       RN Pressure  Injury Documentation:     Pressure Injury 04/13/23 Perineum Bilateral Stage 2 -  Partial thickness loss of dermis presenting as a shallow open injury with a red, pink wound bed without slough. perineum/scrotum masd on admission (Active)  04/13/23 1100  Location: Perineum  Location Orientation: Bilateral  Staging: Stage 2 -  Partial thickness loss of dermis presenting as a shallow open injury with a red, pink wound bed without slough.  Wound Description (Comments): perineum/scrotum masd on admission  Present on Admission:   Dressing Type Negative pressure wound therapy 04/24/23 1300      Malnutrition Type:   Nutrition Problem: Moderate Malnutrition Etiology: chronic illness     Malnutrition Characteristics:   Signs/Symptoms: energy intake < or equal to 75% for > or equal to 1 month, moderate fat depletion, moderate muscle depletion     Nutrition Interventions:   Interventions: MVI, Magic cup   Estimated body mass index is 36.82 kg/m as calculated from the following:   Height as of this encounter: 6\' 4"  (1.93 m).   Weight as of this encounter: 137.2 kg.    Old records reviewed in assessment of this patient  DVT prophylaxis:  enoxaparin (LOVENOX) injection 40 mg Start: 04/15/23 2000 SCD's Start: 04/08/23 1658 SCDs Start: 04/07/23 0104   Code Status:   Code Status: Full Code  Mobility Assessment (Last 72 Hours)     Mobility Assessment     Row Name 04/29/23 0851 04/28/23 2009 04/28/23 1400 04/28/23 1023 04/27/23 2300   Does patient have an order for bedrest or  is patient medically unstable No - Continue assessment No - Continue assessment -- No - Continue assessment No - Continue assessment   What is the highest level of mobility based on the progressive mobility assessment? Level 2 (Chairfast) - Balance while sitting on edge of bed and cannot stand Level 2 (Chairfast) - Balance while sitting on edge of bed and cannot stand Level 2 (Chairfast) - Balance while sitting on  edge of bed and cannot stand Level 2 (Chairfast) - Balance while sitting on edge of bed and cannot stand Level 2 (Chairfast) - Balance while sitting on edge of bed and cannot stand   Is the above level different from baseline mobility prior to current illness? Yes - Recommend PT order Yes - Recommend PT order -- Yes - Recommend PT order Yes - Recommend PT order    Row Name 04/27/23 1600 04/27/23 1537 04/27/23 1000 04/27/23 0817 04/26/23 1700   Does patient have an order for bedrest or is patient medically unstable -- -- No - Continue assessment No - Continue assessment No - Continue assessment   What is the highest level of mobility based on the progressive mobility assessment? Level 2 (Chairfast) - Balance while sitting on edge of bed and cannot stand Level 2 (Chairfast) - Balance while sitting on edge of bed and cannot stand Level 2 (Chairfast) - Balance while sitting on edge of bed and cannot stand Level 2 (Chairfast) - Balance while sitting on edge of bed and cannot stand Level 2 (Chairfast) - Balance while sitting on edge of bed and cannot stand   Is the above level different from baseline mobility prior to current illness? -- -- Yes - Recommend PT order Yes - Recommend PT order Yes - Recommend PT order            Barriers to discharge: unsafe d/c Disposition Plan:  U/K Status is: Inpt  Objective: Blood pressure (!) 150/78, pulse 78, temperature 97.6 F (36.4 C), temperature source Oral, resp. rate 16, height 6\' 4"  (1.93 m), weight (!) 144.7 kg, SpO2 97%.  Examination:  Physical Exam Constitutional:      General: He is not in acute distress.    Appearance: Normal appearance.  HENT:     Head: Normocephalic and atraumatic.     Mouth/Throat:     Mouth: Mucous membranes are moist.  Eyes:     Extraocular Movements: Extraocular movements intact.  Cardiovascular:     Rate and Rhythm: Normal rate and regular rhythm.  Pulmonary:     Effort: Pulmonary effort is normal. No respiratory  distress.     Breath sounds: Normal breath sounds. No wheezing.  Abdominal:     General: Bowel sounds are normal. There is no distension.     Palpations: Abdomen is soft.     Tenderness: There is no abdominal tenderness.  Musculoskeletal:     Cervical back: Normal range of motion and neck supple.     Comments: B/L BKA noted; WV noted on R BKA  Skin:    General: Skin is warm and dry.  Neurological:     General: No focal deficit present.     Mental Status: He is alert.  Psychiatric:        Mood and Affect: Mood normal.        Behavior: Behavior normal.      Consultants:  Orthopedic surgery  Procedures:  11/27: R BKA  Data Reviewed: Results for orders placed or performed during the hospital encounter of 04/06/23 (from  the past 24 hours)  Glucose, capillary     Status: None   Collection Time: 04/28/23 12:59 PM  Result Value Ref Range   Glucose-Capillary 92 70 - 99 mg/dL  Glucose, capillary     Status: Abnormal   Collection Time: 04/28/23  7:48 PM  Result Value Ref Range   Glucose-Capillary 167 (H) 70 - 99 mg/dL  Glucose, capillary     Status: None   Collection Time: 04/29/23  8:19 AM  Result Value Ref Range   Glucose-Capillary 96 70 - 99 mg/dL   Comment 1 Notify RN   Glucose, capillary     Status: Abnormal   Collection Time: 04/29/23 11:54 AM  Result Value Ref Range   Glucose-Capillary 101 (H) 70 - 99 mg/dL   Comment 1 Notify RN     I have reviewed pertinent nursing notes, vitals, labs, and images as necessary. I have ordered labwork to follow up on as indicated.  I have reviewed the last notes from staff over past 24 hours. I have discussed patient's care plan and test results with nursing staff, CM/SW, and other staff as appropriate.    LOS: 22 days   Lewie Chamber, MD [facility] 04/29/2023, 12:36 PM

## 2023-04-29 NOTE — Plan of Care (Signed)
  Problem: Fluid Volume: Goal: Hemodynamic stability will improve Outcome: Progressing   Problem: Clinical Measurements: Goal: Diagnostic test results will improve Outcome: Progressing Goal: Signs and symptoms of infection will decrease Outcome: Progressing   Problem: Respiratory: Goal: Ability to maintain adequate ventilation will improve Outcome: Progressing   Problem: Health Behavior/Discharge Planning: Goal: Ability to manage health-related needs will improve Outcome: Progressing   Problem: Clinical Measurements: Goal: Ability to maintain clinical measurements within normal limits will improve Outcome: Progressing Goal: Will remain free from infection Outcome: Progressing Goal: Diagnostic test results will improve Outcome: Progressing Goal: Respiratory complications will improve Outcome: Progressing Goal: Cardiovascular complication will be avoided Outcome: Progressing   Problem: Activity: Goal: Risk for activity intolerance will decrease Outcome: Progressing   Problem: Nutrition: Goal: Adequate nutrition will be maintained Outcome: Progressing   Problem: Coping: Goal: Level of anxiety will decrease Outcome: Progressing   Problem: Elimination: Goal: Will not experience complications related to bowel motility Outcome: Progressing Goal: Will not experience complications related to urinary retention Outcome: Progressing   Problem: Pain Management: Goal: General experience of comfort will improve Outcome: Progressing   Problem: Safety: Goal: Ability to remain free from injury will improve Outcome: Progressing   Problem: Skin Integrity: Goal: Risk for impaired skin integrity will decrease Outcome: Progressing   Problem: Activity: Goal: Ability to tolerate increased activity will improve Outcome: Progressing   Problem: Clinical Measurements: Goal: Ability to maintain a body temperature in the normal range will improve Outcome: Progressing   Problem:  Respiratory: Goal: Ability to maintain adequate ventilation will improve Outcome: Progressing Goal: Ability to maintain a clear airway will improve Outcome: Progressing   Problem: Education: Goal: Knowledge of the prescribed therapeutic regimen will improve Outcome: Progressing Goal: Ability to verbalize activity precautions or restrictions will improve Outcome: Progressing Goal: Understanding of discharge needs will improve Outcome: Progressing   Problem: Activity: Goal: Ability to perform//tolerate increased activity and mobilize with assistive devices will improve Outcome: Progressing   Problem: Clinical Measurements: Goal: Postoperative complications will be avoided or minimized Outcome: Progressing   Problem: Self-Care: Goal: Ability to meet self-care needs will improve Outcome: Progressing   Problem: Self-Concept: Goal: Ability to maintain and perform role responsibilities to the fullest extent possible will improve Outcome: Progressing   Problem: Education: Goal: Knowledge of General Education information will improve Description: Including pain rating scale, medication(s)/side effects and non-pharmacologic comfort measures Outcome: Progressing   Problem: Health Behavior/Discharge Planning: Goal: Ability to manage health-related needs will improve Outcome: Progressing   Problem: Clinical Measurements: Goal: Ability to maintain clinical measurements within normal limits will improve Outcome: Progressing Goal: Will remain free from infection Outcome: Progressing Goal: Diagnostic test results will improve Outcome: Progressing Goal: Respiratory complications will improve Outcome: Progressing Goal: Cardiovascular complication will be avoided Outcome: Progressing   Problem: Activity: Goal: Risk for activity intolerance will decrease Outcome: Progressing   Problem: Nutrition: Goal: Adequate nutrition will be maintained Outcome: Progressing   Problem:  Coping: Goal: Level of anxiety will decrease Outcome: Progressing   Problem: Elimination: Goal: Will not experience complications related to bowel motility Outcome: Progressing Goal: Will not experience complications related to urinary retention Outcome: Progressing   Problem: Pain Management: Goal: General experience of comfort will improve Outcome: Progressing   Problem: Safety: Goal: Ability to remain free from injury will improve Outcome: Progressing   Problem: Skin Integrity: Goal: Risk for impaired skin integrity will decrease Outcome: Progressing

## 2023-04-29 NOTE — Plan of Care (Signed)
 Problem: Fluid Volume: Goal: Hemodynamic stability will improve Outcome: Progressing   Problem: Clinical Measurements: Goal: Diagnostic test results will improve Outcome: Progressing Goal: Signs and symptoms of infection will decrease Outcome: Progressing   Problem: Respiratory: Goal: Ability to maintain adequate ventilation will improve Outcome: Progressing   Problem: Education: Goal: Knowledge of General Education information will improve Description: Including pain rating scale, medication(s)/side effects and non-pharmacologic comfort measures Outcome: Progressing   Problem: Health Behavior/Discharge Planning: Goal: Ability to manage health-related needs will improve Outcome: Progressing   Problem: Clinical Measurements: Goal: Ability to maintain clinical measurements within normal limits will improve Outcome: Progressing Goal: Will remain free from infection Outcome: Progressing Goal: Diagnostic test results will improve Outcome: Progressing Goal: Respiratory complications will improve Outcome: Progressing Goal: Cardiovascular complication will be avoided Outcome: Progressing   Problem: Activity: Goal: Risk for activity intolerance will decrease Outcome: Progressing   Problem: Nutrition: Goal: Adequate nutrition will be maintained Outcome: Progressing   Problem: Coping: Goal: Level of anxiety will decrease Outcome: Progressing   Problem: Elimination: Goal: Will not experience complications related to bowel motility Outcome: Progressing Goal: Will not experience complications related to urinary retention Outcome: Progressing   Problem: Pain Management: Goal: General experience of comfort will improve Outcome: Progressing   Problem: Safety: Goal: Ability to remain free from injury will improve Outcome: Progressing   Problem: Skin Integrity: Goal: Risk for impaired skin integrity will decrease Outcome: Progressing   Problem: Activity: Goal: Ability  to tolerate increased activity will improve Outcome: Progressing   Problem: Clinical Measurements: Goal: Ability to maintain a body temperature in the normal range will improve Outcome: Progressing   Problem: Respiratory: Goal: Ability to maintain adequate ventilation will improve Outcome: Progressing Goal: Ability to maintain a clear airway will improve Outcome: Progressing   Problem: Education: Goal: Knowledge of the prescribed therapeutic regimen will improve Outcome: Progressing Goal: Ability to verbalize activity precautions or restrictions will improve Outcome: Progressing Goal: Understanding of discharge needs will improve Outcome: Progressing   Problem: Activity: Goal: Ability to perform//tolerate increased activity and mobilize with assistive devices will improve Outcome: Progressing   Problem: Clinical Measurements: Goal: Postoperative complications will be avoided or minimized Outcome: Progressing   Problem: Self-Care: Goal: Ability to meet self-care needs will improve Outcome: Progressing   Problem: Self-Concept: Goal: Ability to maintain and perform role responsibilities to the fullest extent possible will improve Outcome: Progressing   Problem: Education: Goal: Knowledge of General Education information will improve Description: Including pain rating scale, medication(s)/side effects and non-pharmacologic comfort measures Outcome: Progressing   Problem: Health Behavior/Discharge Planning: Goal: Ability to manage health-related needs will improve Outcome: Progressing   Problem: Clinical Measurements: Goal: Ability to maintain clinical measurements within normal limits will improve Outcome: Progressing Goal: Will remain free from infection Outcome: Progressing Goal: Diagnostic test results will improve Outcome: Progressing Goal: Respiratory complications will improve Outcome: Progressing Goal: Cardiovascular complication will be avoided Outcome:  Progressing   Problem: Activity: Goal: Risk for activity intolerance will decrease Outcome: Progressing   Problem: Nutrition: Goal: Adequate nutrition will be maintained Outcome: Progressing   Problem: Coping: Goal: Level of anxiety will decrease Outcome: Progressing   Problem: Elimination: Goal: Will not experience complications related to bowel motility Outcome: Progressing Goal: Will not experience complications related to urinary retention Outcome: Progressing   Problem: Pain Management: Goal: General experience of comfort will improve Outcome: Progressing   Problem: Safety: Goal: Ability to remain free from injury will improve Outcome: Progressing   Problem: Skin Integrity: Goal: Risk for  impaired skin integrity will decrease Outcome: Progressing

## 2023-04-30 ENCOUNTER — Ambulatory Visit: Payer: Self-pay | Admitting: Orthopedic Surgery

## 2023-04-30 DIAGNOSIS — A419 Sepsis, unspecified organism: Secondary | ICD-10-CM | POA: Diagnosis not present

## 2023-04-30 DIAGNOSIS — M86271 Subacute osteomyelitis, right ankle and foot: Secondary | ICD-10-CM

## 2023-04-30 DIAGNOSIS — M86171 Other acute osteomyelitis, right ankle and foot: Secondary | ICD-10-CM | POA: Diagnosis not present

## 2023-04-30 DIAGNOSIS — D649 Anemia, unspecified: Secondary | ICD-10-CM

## 2023-04-30 LAB — GLUCOSE, CAPILLARY
Glucose-Capillary: 98 mg/dL (ref 70–99)
Glucose-Capillary: 124 mg/dL — ABNORMAL HIGH (ref 70–99)
Glucose-Capillary: 84 mg/dL (ref 70–99)
Glucose-Capillary: 104 mg/dL — ABNORMAL HIGH (ref 70–99)

## 2023-04-30 LAB — SARS CORONAVIRUS 2 BY RT PCR: SARS Coronavirus 2 by RT PCR: POSITIVE — AB

## 2023-04-30 MED ORDER — NIRMATRELVIR/RITONAVIR (PAXLOVID)TABLET
3.0000 | ORAL_TABLET | Freq: Two times a day (BID) | ORAL | Status: AC
  Administered 2023-04-30 – 2023-05-05 (×10): 3 via ORAL
  Filled 2023-04-30: qty 30

## 2023-04-30 NOTE — Progress Notes (Signed)
Progress Note    Alan Tapia.   WUJ:[id number]  DOB: 1976/05/09  DOA: 04/06/2023     23 PCP: Patient, No Pcp Per  Initial CC: right foot wound  Hospital Course: 47 year old male with past medical history significant for left lower extremity BKA, dilated cardiomyopathy, history of combined systolic and diastolic heart failure with EF 35 to 40%, hypertension, insulin-dependent diabetes, peripheral neuropathy, hyperlipidemia who presented to the emergency department on 04/06/2023 with complaints of odor, wound of his foot. He was also having nausea, vomiting and diarrhea. Orthopedic surgery was consulted and patient underwent right BKA 11/27 by Dr. Lajoyce Corners.  Interval History:  No issues overnight.  Overall relatively stable. Wife apparently tested positive for COVID.  Patient also complaining of some shortness of breath today.  He had this a couple days ago and had effusion noted on CXR but declined Lasix.  Assessment and Plan:  Sepsis 2/2 to non healing right foot ulcer Previous history of left BKA -Status post right BKA 11/27 -Blood cultures negative. - Dr. Lajoyce Corners has seen again  on 12/18; WV removed; okay for dry dressing changes with stump shrinker on top  Dyspnea - CXR showing some volume overload - patient declined lasix - wife testing positive for covid; will test patient as well   Social issues - very complex dispo issue, but CM/SW are working hard at helping figure out dispo plans; also involved are DSS and others; patient home not safe for current needs and he'll need accessible housing; also still looking for [facility] which has been a barrier  Microcytic anemia FOBT negative. S/p PRBC transfusions this hospitalization.  Combination of iron def anemia and anemia of chronic disease.   Hypertension Continue with metoprolol 50 mg BID.    Type 2 DM with hyperglycemia SSI A1c IS 6.8% no changes in meds   AKI - resolved  - renal u/s normal   Vomiting suspect from  gastroparesis. - resolved - d/c reglan; having multiple BM now       RN Pressure Injury Documentation:     Pressure Injury 04/13/23 Perineum Bilateral Stage 2 -  Partial thickness loss of dermis presenting as a shallow open injury with a red, pink wound bed without slough. perineum/scrotum masd on admission (Active)  04/13/23 1100  Location: Perineum  Location Orientation: Bilateral  Staging: Stage 2 -  Partial thickness loss of dermis presenting as a shallow open injury with a red, pink wound bed without slough.  Wound Description (Comments): perineum/scrotum masd on admission  Present on Admission:   Dressing Type Negative pressure wound therapy 04/24/23 1300      Malnutrition Type:   Nutrition Problem: Moderate Malnutrition Etiology: chronic illness     Malnutrition Characteristics:   Signs/Symptoms: energy intake < or equal to 75% for > or equal to 1 month, moderate fat depletion, moderate muscle depletion     Nutrition Interventions:   Interventions: MVI, Magic cup   Estimated body mass index is 36.82 kg/m as calculated from the following:   Height as of this encounter: 6\' 4"  (1.93 m).   Weight as of this encounter: 137.2 kg.    Old records reviewed in assessment of this patient  DVT prophylaxis:  enoxaparin (LOVENOX) injection 40 mg Start: 04/15/23 2000 SCD's Start: 04/08/23 1658 SCDs Start: 04/07/23 0104   Code Status:   Code Status: Full Code  Mobility Assessment (Last 72 Hours)     Mobility Assessment     Row Name 04/29/23 1949 04/29/23 0851 04/28/23 2009  04/28/23 1400 04/28/23 1023   Does patient have an order for bedrest or is patient medically unstable No - Continue assessment No - Continue assessment No - Continue assessment -- No - Continue assessment   What is the highest level of mobility based on the progressive mobility assessment? Level 2 (Chairfast) - Balance while sitting on edge of bed and cannot stand Level 2 (Chairfast) - Balance while  sitting on edge of bed and cannot stand Level 2 (Chairfast) - Balance while sitting on edge of bed and cannot stand Level 2 (Chairfast) - Balance while sitting on edge of bed and cannot stand Level 2 (Chairfast) - Balance while sitting on edge of bed and cannot stand   Is the above level different from baseline mobility prior to current illness? Yes - Recommend PT order Yes - Recommend PT order Yes - Recommend PT order -- Yes - Recommend PT order    Row Name 04/27/23 2300 04/27/23 1600 04/27/23 1537       Does patient have an order for bedrest or is patient medically unstable No - Continue assessment -- --     What is the highest level of mobility based on the progressive mobility assessment? Level 2 (Chairfast) - Balance while sitting on edge of bed and cannot stand Level 2 (Chairfast) - Balance while sitting on edge of bed and cannot stand Level 2 (Chairfast) - Balance while sitting on edge of bed and cannot stand     Is the above level different from baseline mobility prior to current illness? Yes - Recommend PT order -- --              Barriers to discharge: unsafe d/c Disposition Plan:  U/K Status is: Inpt  Objective: Blood pressure (!) 164/83, pulse 91, temperature 98.1 F (36.7 C), temperature source Oral, resp. rate 17, height 6\' 4"  (1.93 m), weight (!) 141.4 kg, SpO2 100%.  Examination:  Physical Exam Constitutional:      General: He is not in acute distress.    Appearance: Normal appearance.  HENT:     Head: Normocephalic and atraumatic.     Mouth/Throat:     Mouth: Mucous membranes are moist.  Eyes:     Extraocular Movements: Extraocular movements intact.  Cardiovascular:     Rate and Rhythm: Normal rate and regular rhythm.  Pulmonary:     Effort: Pulmonary effort is normal. No respiratory distress.     Breath sounds: Normal breath sounds. No wheezing.  Abdominal:     General: Bowel sounds are normal. There is no distension.     Palpations: Abdomen is soft.      Tenderness: There is no abdominal tenderness.  Musculoskeletal:     Cervical back: Normal range of motion and neck supple.     Comments: B/L BKA noted; WV noted on R BKA  Skin:    General: Skin is warm and dry.  Neurological:     General: No focal deficit present.     Mental Status: He is alert.  Psychiatric:        Mood and Affect: Mood normal.        Behavior: Behavior normal.      Consultants:  Orthopedic surgery  Procedures:  11/27: R BKA  Data Reviewed: Results for orders placed or performed during the hospital encounter of 04/06/23 (from the past 24 hours)  Glucose, capillary     Status: Abnormal   Collection Time: 04/29/23  5:11 PM  Result Value Ref Range  Glucose-Capillary 114 (H) 70 - 99 mg/dL   Comment 1 Notify RN   Glucose, capillary     Status: Abnormal   Collection Time: 04/29/23  8:29 PM  Result Value Ref Range   Glucose-Capillary 119 (H) 70 - 99 mg/dL  Glucose, capillary     Status: None   Collection Time: 04/30/23  7:54 AM  Result Value Ref Range   Glucose-Capillary 98 70 - 99 mg/dL  Glucose, capillary     Status: Abnormal   Collection Time: 04/30/23 12:54 PM  Result Value Ref Range   Glucose-Capillary 124 (H) 70 - 99 mg/dL    I have reviewed pertinent nursing notes, vitals, labs, and images as necessary. I have ordered labwork to follow up on as indicated.  I have reviewed the last notes from staff over past 24 hours. I have discussed patient's care plan and test results with nursing staff, CM/SW, and other staff as appropriate.    LOS: 23 days   Lewie Chamber, MD [facility] 04/30/2023, 1:37 PM

## 2023-04-30 NOTE — TOC Progression Note (Signed)
Transition of Care (TOC) - Progression Note    Patient Details  Name: Alan Tapia. MRN: [id number] Date of Birth: 1976/03/20  Transition of [facility]) CM/SW Contact  Carley Hammed, LCSW Phone Number: 04/30/2023, 2:58 PM  Clinical Narrative:     CSW received a call from pt's spouse who stated she had left yesterday as she was not feeling well, and was diagnosed with covid. Spouse requested CSW send the housing documentation that had been prepared so she could pass it on to the Investment banker, corporate. Per spouse, Investment banker, corporate has agreed to provide appropriate renovations to make apt handicap accessible. Spouse states that she signed pt up for Cap D and had adapt come switch out the hospital bed.  CSW followed up with Hale Bogus at DSS to advise of these updates. TOC will continue to follow.   Expected Discharge Plan: Skilled Nursing Facility Barriers to Discharge: Insurance Authorization, Continued Medical Work up, SNF Pending bed offer  Expected Discharge Plan and Services In-house Referral: Clinical Social Work     Living arrangements for the past 2 months: Single Family Home Expected Discharge Date: 04/11/23                                     Social Determinants of Health (SDOH) Interventions SDOH Screenings   Food Insecurity: No Food Insecurity (04/07/2023)  Housing: Unknown (04/23/2023)  Recent Concern: Housing - Medium Risk (04/07/2023)  Transportation Needs: No Transportation Needs (04/07/2023)  Recent Concern: Transportation Needs - Unmet Transportation Needs (02/17/2023)   Received from [facility]  Utilities: At Risk (04/07/2023)  Alcohol Screen: Low Risk  (08/20/2022)  Financial Resource Strain: Patient Unable To Answer (08/20/2022)  Tobacco Use: Low Risk  (04/08/2023)    Readmission Risk Interventions    04/07/2023    2:34 PM  Readmission Risk Prevention Plan  Transportation Screening Complete  PCP or Specialist Appt within 3-5 Days Complete  HRI  or Home Care Consult Complete  Social Work Consult for Recovery Care Planning/Counseling Complete  Palliative Care Screening Not Applicable  Medication Review Oceanographer) Complete

## 2023-04-30 NOTE — Progress Notes (Signed)
Physical Therapy Treatment Patient Details Name: Alan Tapia. MRN: [id number] DOB: January 14, 1976 Today's Date: 04/30/2023   History of Present Illness The pt is a 47 yo male presenting 11/25 with nausea, vomiting, and fever as well as R foot wound. 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    PT Comments  Pt in bed upon arrival and agreeable to PT session. Worked on transfer training and bed mobility in today's session. Pt attempted A/P transfer to recliner in this session. Pt did not want any physical assistance with scooting as he wants to be as independent as possible. Pt preferred to lean laterally and scoot opposite hip posteriorly. Pt performed x8 scoot attempts, however, was unable to make significant progress posteriorly w/o physical assistance. Pt then required ModAx2 with use of bed pad to scoot towards HOB. Multiple rolls were performed in supine for pericare and linen change. Updated and extended time frame for Acute PT goals. Acute PT to follow.      If plan is discharge home, recommend the following: Two people to help with walking and/or transfers;Assist for transportation;Assistance with cooking/housework;Help with stairs or ramp for entrance   Can travel by private vehicle     No  Equipment Recommendations  Hoyer lift;Wheelchair cushion (measurements PT);Wheelchair (measurements PT)       Precautions / Restrictions Precautions Precautions: Fall;Other (comment) Precaution Comments: RLE limb protector Restrictions Weight Bearing Restrictions Per Provider Order: Yes RLE Weight Bearing Per Provider Order: Non weight bearing Other Position/Activity Restrictions: bilateral BKA     Mobility  Bed Mobility Overal bed mobility: Needs Assistance Bed Mobility: Rolling, Supine to Sit, Sit to Supine Rolling: Min  assist, +2 for safety/equipment, Used rails   Supine to sit: Mod assist, HOB elevated, Used rails Sit to supine: Mod assist, HOB elevated   General bed mobility comments: Pt performed x5 rolls to L/R for pericare and to change bed linen w/ use of bed pad.  ModA for sup/sit transfer to trunk elevation, pt is able to manage LE's. ModA for sit/sup transfer for LE management. Pt able to scoot towards HOB in trendelenburg w/ B UE on rail and ModAx2 w/ bed pad    Transfers    General transfer comment: practiced scooting posteriorly to work on A/P transfer into recliner. Pt taught two methods- lean forwards and push with B UE posteriorly and to lean laterally and scoot opposite hip posteriorly. Pt did not want physical assistance during scoots. Pt able to scoot minimally using lateral lean technique. x8 scoots. Lateral scoots w/ ModAx2 and bed pad    Ambulation/Gait  General Gait Details: unable         Balance Overall balance assessment: Needs assistance Sitting-balance support: Bilateral upper extremity supported, Feet unsupported Sitting balance-Leahy Scale: Fair Sitting balance - Comments: CGA for safety and pt safety         Cognition Arousal: Alert Behavior During Therapy: WFL for tasks assessed/performed Overall Cognitive Status: Within Functional Limits for tasks assessed     Exercises Amputee Exercises Hip ABduction/ADduction: AAROM, Sidelying, Left, 5 reps Other Exercises Other Exercises: HEP: Visit    [email or web address]    Access [email or web address]: [email or web address]    General Comments General comments (skin integrity, edema, etc.): VSS on RA, slight skin breakdown on buttocks. Pt was able to verbalize changing positions every two hours and  reports compliance      Pertinent Vitals/Pain Pain Assessment Pain Assessment: Faces Faces Pain Scale: Hurts whole lot Pain Location: bottom and scrotum Pain Descriptors / Indicators: Grimacing, Discomfort, Guarding, Moaning Pain  Intervention(s): Limited activity within patient's tolerance, Monitored during session, Repositioned     PT Goals (current goals can now be found in the care plan section) Acute Rehab PT Goals Patient Stated Goal: get bil prosthetics PT Goal Formulation: With patient Time For Goal Achievement: 04/24/23 Potential to Achieve Goals: Fair Progress towards PT goals: Progressing toward goals    Frequency    Min 1X/week       AM-PAC PT "6 Clicks" Mobility   Outcome Measure  Help needed turning from your back to your side while in a flat bed without using bedrails?: A Lot Help needed moving from lying on your back to sitting on the side of a flat bed without using bedrails?: A Lot Help needed moving to and from a bed to a chair (including a wheelchair)?: Total Help needed standing up from a chair using your arms (e.g., wheelchair or bedside chair)?: Total Help needed to walk in hospital room?: Total Help needed climbing 3-5 steps with a railing? : Total 6 Click Score: 8    End of Session Equipment Utilized During Treatment: Other (comment) (R limb protector) Activity Tolerance: Patient limited by pain Patient left: in bed;with call bell/phone within reach;with bed alarm set Nurse Communication: Mobility status PT Visit Diagnosis: Other abnormalities of gait and mobility (R26.89);Muscle weakness (generalized) (M62.81);Pain Pain - part of body: Leg (scrotum/peri area)     Time: 1410-1445 PT Time Calculation (min) (ACUTE ONLY): 35 min  Charges:    $Therapeutic Exercise: 8-22 mins $Therapeutic Activity: 8-22 mins PT General Charges $$ ACUTE PT VISIT: 1 Visit                     Hilton Cork, PT, DPT Secure Chat Preferred  Rehab Office [phone]   Arturo Morton Brion Aliment 04/30/2023, 4:11 PM

## 2023-05-01 DIAGNOSIS — M86171 Other acute osteomyelitis, right ankle and foot: Secondary | ICD-10-CM | POA: Diagnosis not present

## 2023-05-01 DIAGNOSIS — L8990 Pressure ulcer of unspecified site, unspecified stage: Secondary | ICD-10-CM

## 2023-05-01 DIAGNOSIS — U071 COVID-19: Secondary | ICD-10-CM | POA: Diagnosis not present

## 2023-05-01 DIAGNOSIS — R6520 Severe sepsis without septic shock: Secondary | ICD-10-CM | POA: Diagnosis not present

## 2023-05-01 DIAGNOSIS — A419 Sepsis, unspecified organism: Secondary | ICD-10-CM | POA: Diagnosis not present

## 2023-05-01 LAB — GLUCOSE, CAPILLARY
Glucose-Capillary: 80 mg/dL (ref 70–99)
Glucose-Capillary: 93 mg/dL (ref 70–99)
Glucose-Capillary: 106 mg/dL — ABNORMAL HIGH (ref 70–99)
Glucose-Capillary: 88 mg/dL (ref 70–99)

## 2023-05-01 MED ORDER — SIMETHICONE 80 MG PO CHEW
80.0000 mg | CHEWABLE_TABLET | Freq: Four times a day (QID) | ORAL | Status: DC | PRN
  Administered 2023-05-01 – 2023-06-29 (×55): 80 mg via ORAL
  Filled 2023-05-01 (×61): qty 1

## 2023-05-01 MED ORDER — FUROSEMIDE 10 MG/ML IJ SOLN
40.0000 mg | Freq: Every day | INTRAMUSCULAR | Status: AC
  Administered 2023-05-01 – 2023-05-03 (×3): 40 mg via INTRAVENOUS
  Filled 2023-05-01 (×3): qty 4

## 2023-05-01 MED ORDER — ENOXAPARIN SODIUM 80 MG/0.8ML IJ SOSY
70.0000 mg | PREFILLED_SYRINGE | Freq: Every day | INTRAMUSCULAR | Status: DC
  Administered 2023-05-03 – 2023-06-03 (×31): 70 mg via SUBCUTANEOUS
  Filled 2023-05-01 (×33): qty 0.8

## 2023-05-01 NOTE — Progress Notes (Signed)
   [name]   PCP: Patient, No Pcp Per PCP-Cardiologist: Meriam Sprague, MD (Inactive)    HPI:  47 yo M with PMH of CHF, L BKA, HTN, T2DM, peripheral neuropathy, and HLD.   He presented to the ED on 11/25 with foot infection. Underwent R BKA on 11/27 with Dr. Lajoyce Corners. He was admitted back in April 2024 with L foot infection where he underwent L BKA. ECHO at that time with EF 35-40%. Cardiology was consulted and started on GDMT with lasix, carvedilol, Entresto, and spironolactone. He complained of multiple intolerances to medications and was asking for de-escalation of therapy.   States that his PCP discontinued all HF GDMT because "there were duplicate therapies". Counseled on HF GDMT and tried to explain the rationale and why they were not duplicates. Patient states he felt like everyone who saw him (myself included) were trying to start "15 different medications for the same thing" and does not want the same this time around. He was initially declining lasix by Dr. Frederick Peers today but is willing to take this now. He states that he is always having difficulty with fluid overload and when asked if he is willing to start medications for this and for improvement of his heart function, he is requesting to take it "one step at a time."   Current HF Medications: Diuretic: furosemide 40 mg IV daily Beta Blocker: metoprolol tartrate 50 mg BID  Prior to admission HF Medications: None - not taking furosemide, metoprolol tartrate, or spironolactone  Pertinent Lab Values: As of 12/17: Serum creatinine 1.15, BUN 8, Potassium 4.0, Sodium 139, A1c 6.4   Vital Signs: Weight: 300 lbs (admission weight: 319 lbs) Blood pressure: 150-160/90s  Heart rate: 80s  I/O: net even yesterday; net -3.7L since admission  Medication Assistance / Insurance Benefits Check: Does the patient have prescription insurance?  Yes Type of insurance plan: Chiropractor  Outpatient Pharmacy:  Prior to admission outpatient pharmacy: [facility] Is the patient willing to use [facility] TOC pharmacy at discharge? Yes Is the patient willing to transition their outpatient pharmacy to utilize a [facility] outpatient pharmacy?   No    Assessment: 1. Acute on chronic systolic CHF (LVEF 35-40%). NYHA class III symptoms. - Patient now agreeable to furosemide 40 mg IV daily. Strict I/Os and daily weights. Keep K>4 and Mg>2. - On metoprolol tartrate 50 mg BID, would consolidate this to metoprolol XL 100 mg daily - Patient is unwilling to add GDMT at this time, however he would benefit from [facility], spironolactone, and potentially SGLT2i before discharge. We will have to play this day by day to see what he is willing to take.    Plan: 1) Medication changes recommended at this time: - No changes - patient only willing to start lasix again today. Wants to take things "one step at a time"  2) Patient assistance: - None pending  3)  Education  - Patient has been educated on current HF medications and potential additions to HF medication regimen - Patient verbalizes understanding that over the next few months, these medication doses may change and more medications may be added to optimize HF regimen - Patient has been educated on basic disease state pathophysiology and goals of therapy   Sharen Hones, PharmD, BCPS Heart Failure Stewardship Pharmacist Phone [phone]

## 2023-05-01 NOTE — Progress Notes (Addendum)
Occupational Therapy Treatment Patient Details Name: Alan Tapia. MRN: [id number] DOB: [birth year] Today's Date: 05/01/2023   History of present illness The pt is a 47 yo male presenting 11/25 with nausea, vomiting, and fever as well as R foot wound. 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   OT comments  Pt. Seen for skilled OT treatment session. Continuation of BUE HEP with level 4 blue theraband provided.  Pt. With good return demo.  Some initial cues for technique but able to implement and continue with remainder of exercises.  Bed mobility and repositioning also performed with MAX A and pt. Using BUEs to aide in pulling up in bed.  Cont. With acute OT POC.        If plan is discharge home, recommend the following:  Two people to help with walking and/or transfers;Two people to help with bathing/dressing/bathroom;Assistance with cooking/housework;Assist for transportation;Help with stairs or ramp for entrance   Equipment Recommendations  Hoyer lift    Recommendations for Other Services      Precautions / Restrictions Precautions Precautions: Fall;Other (comment) Precaution Comments: RLE limb protector Restrictions RLE Weight Bearing Per Provider Order: Non weight bearing Other Position/Activity Restrictions: bilateral BKA       Mobility Bed Mobility               General bed mobility comments: pt. able to utilize openings on top of headboard to pull self up in bed.  assisted wtih pillow placement under L hip to aide in pressure relief and L lean pt. reports he had prior to admission    Transfers                         Balance                                           ADL either performed or assessed with clinical judgement   ADL                                               Extremity/Trunk Assessment              Vision       Perception     Praxis      Cognition Arousal: Alert Behavior During Therapy: WFL for tasks assessed/performed Overall Cognitive Status: Within Functional Limits for tasks assessed                                          Exercises General Exercises - Upper Extremity Shoulder Flexion: Strengthening, Both, 10 reps, Seated, Theraband Theraband Level (Shoulder Flexion): Level 4 (Blue) Shoulder Horizontal ABduction: Strengthening, Both, 10 reps, Seated, Theraband Theraband Level (Shoulder Horizontal Abduction): Level 4 (Blue) Elbow Flexion: Strengthening, Both, 10 reps, Seated, Theraband Theraband Level (Elbow Flexion): Level 4 (Blue) Elbow Extension: Strengthening, Both, 10 reps, Seated, Theraband Theraband Level (Elbow Extension): Level 4 (Blue)  HOB elevated and pt. 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, broadcasting/film/video for elementary and highschool also     Pertinent Vitals/ Pain       Pain Assessment Pain Assessment: Faces Faces Pain Scale: Hurts little more Pain Location: lower back Pain Descriptors / Indicators: Discomfort Pain Intervention(s): Repositioned  Home Living                                          Prior Functioning/Environment              Frequency  Min 1X/week        Progress Toward Goals  OT Goals(current goals can now be found in the care plan section)  Progress towards OT goals: Progressing toward goals     Plan      Co-evaluation                 AM-PAC OT "6 Clicks" Daily Activity     Outcome Measure   Help from another person eating meals?: A Little Help from another person taking care of personal grooming?: A Little Help from another person  toileting, which includes using toliet, bedpan, or urinal?: Total Help from another person bathing (including washing, rinsing, drying)?: A Lot Help from another person to put on and taking off regular upper body clothing?: A Little Help from another person to put on and taking off regular lower body clothing?: Total 6 Click Score: 13    End of Session    OT Visit Diagnosis: Other abnormalities of gait and mobility (R26.89);Muscle weakness (generalized) (M62.81);Other (comment)   Activity Tolerance Patient tolerated treatment well   Patient Left in bed;with call bell/phone within reach;with bed alarm set   Nurse Communication          Time: 1610-9604 OT Time Calculation (min): 30 min  Charges: OT General Charges $OT Visit: 1 Visit OT Treatments $Therapeutic Exercise: 23-37 mins  [name], COTA/L Acute Rehabilitation [phone]   [name]-COTA/L 05/01/2023, 12:25 PM

## 2023-05-01 NOTE — Progress Notes (Signed)
Progress Note    Alan Tapia.   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 He was also having nausea, vomiting and diarrhea. Orthopedic surgery was consulted and patient underwent right BKA 11/27 by Dr. Lajoyce Corners.  Interval History:  No issues overnight.  Still having some dyspnea and little bit more of a cough.  Overall feels like he has the flu. Wife at home recovering from COVID still.  Assessment and Plan:  Sepsis 2/2 to non healing right foot ulcer Previous history of left BKA -Status post right BKA 11/27 -Blood cultures negative. - Dr. Lajoyce Corners has seen again on 12/18; WV removed; okay for dry dressing changes with stump shrinker on top  Dyspnea Covid 19 infection - CXR showing some volume overload - patient declined lasix; now amenable on 12/20 - wife testing positive for covid then visiting; will test patient as well (tested positive 12/19) - patient amenable with Paxlovid  - continue incentive spirometer - BMP while on lasix   Social issues - very complex dispo issue, but CM/SW are working hard at helping figure out dispo plans; also involved are DSS and others; patient home not safe for current needs and he'll need accessible housing; also still looking for [facility] which has been a barrier  Microcytic anemia FOBT negative. S/p PRBC transfusions this hospitalization.  Combination of iron def anemia and anemia of chronic disease.   Hypertension Continue with metoprolol 50 mg BID.    Type 2 DM with hyperglycemia SSI A1c IS  6.8% no changes in meds   AKI - resolved  - renal u/s normal   Vomiting suspect from gastroparesis. - resolved - d/c reglan; having multiple BM now       RN Pressure Injury Documentation:     Pressure Injury 04/13/23 Perineum Bilateral Stage 2 -  Partial thickness loss of dermis presenting as a shallow open injury with a red, pink wound bed without slough. perineum/scrotum masd on admission (Active)  04/13/23 1100  Location: Perineum  Location Orientation: Bilateral  Staging: Stage 2 -  Partial thickness loss of dermis presenting as a shallow open injury with a red, pink wound bed without slough.  Wound Description (Comments): perineum/scrotum masd on admission  Present on Admission:   Dressing Type Negative pressure wound therapy 04/24/23 1300      Malnutrition Type:   Nutrition Problem: Moderate Malnutrition Etiology: chronic illness     Malnutrition Characteristics:   Signs/Symptoms: energy intake < or equal to 75% for > or equal to 1 month, moderate fat depletion, moderate muscle depletion     Nutrition Interventions:   Interventions: MVI, Magic cup   Estimated body mass index is 36.82 kg/m as calculated from the following:   Height as of this encounter: 6\' 4"  (1.93 m).   Weight as of this encounter: 137.2 kg.    Old records reviewed in assessment of this patient  DVT prophylaxis:  SCD's Start: 04/08/23 1658 SCDs Start: 04/07/23 0104   Code Status:   Code Status: Full Code  Mobility Assessment (Last 72 Hours)     Mobility Assessment  Row Name 05/01/23 2725 04/30/23 2027 04/30/23 1548 04/30/23 1300 04/29/23 1949   Does patient have an order for bedrest or is patient medically unstable No - Continue assessment No - Continue assessment -- No - Continue assessment No - Continue assessment   What is the highest level of mobility based on the progressive mobility assessment? Level 2 (Chairfast) - Balance while sitting on edge of bed and cannot stand Level 2  (Chairfast) - Balance while sitting on edge of bed and cannot stand Level 2 (Chairfast) - Balance while sitting on edge of bed and cannot stand Level 2 (Chairfast) - Balance while sitting on edge of bed and cannot stand Level 2 (Chairfast) - Balance while sitting on edge of bed and cannot stand   Is the above level different from baseline mobility prior to current illness? -- Yes - Recommend PT order -- -- Yes - Recommend PT order    Row Name 04/29/23 0851 04/28/23 2009         Does patient have an order for bedrest or is patient medically unstable No - Continue assessment No - Continue assessment      What is the highest level of mobility based on the progressive mobility assessment? Level 2 (Chairfast) - Balance while sitting on edge of bed and cannot stand Level 2 (Chairfast) - Balance while sitting on edge of bed and cannot stand      Is the above level different from baseline mobility prior to current illness? Yes - Recommend PT order Yes - Recommend PT order               Barriers to discharge: unsafe d/c Disposition Plan:  U/K Status is: Inpt  Objective: Blood pressure (!) 162/96, pulse 82, temperature (!) 97.5 F (36.4 C), temperature source Oral, resp. rate 16, height 6\' 4"  (1.93 m), weight (!) 136.3 kg, SpO2 100%.  Examination:  Physical Exam Constitutional:      General: He is not in acute distress.    Appearance: Normal appearance.  HENT:     Head: Normocephalic and atraumatic.     Mouth/Throat:     Mouth: Mucous membranes are moist.  Eyes:     Extraocular Movements: Extraocular movements intact.  Cardiovascular:     Rate and Rhythm: Normal rate and regular rhythm.  Pulmonary:     Effort: Pulmonary effort is normal. No respiratory distress.     Breath sounds: Normal breath sounds. No wheezing.  Abdominal:     General: Bowel sounds are normal. There is no distension.     Palpations: Abdomen is soft.     Tenderness: There is no abdominal tenderness.   Musculoskeletal:     Cervical back: Normal range of motion and neck supple.     Comments: B/L BKA noted; R BKA with dry dressing now  Skin:    General: Skin is warm and dry.  Neurological:     General: No focal deficit present.     Mental Status: He is alert.  Psychiatric:        Mood and Affect: Mood normal.        Behavior: Behavior normal.      Consultants:  Orthopedic surgery  Procedures:  11/27: R BKA  Data Reviewed: Results for orders placed or performed during the hospital encounter of 04/06/23 (from the past 24 hours)  Glucose, capillary     Status: None   Collection Time: 04/30/23  6:04 PM  Result Value Ref Range   Glucose-Capillary 84  70 - 99 mg/dL  Glucose, capillary     Status: Abnormal   Collection Time: 04/30/23  9:33 PM  Result Value Ref Range   Glucose-Capillary 104 (H) 70 - 99 mg/dL  Glucose, capillary     Status: None   Collection Time: 05/01/23  7:52 AM  Result Value Ref Range   Glucose-Capillary 80 70 - 99 mg/dL  Glucose, capillary     Status: None   Collection Time: 05/01/23 12:18 PM  Result Value Ref Range   Glucose-Capillary 93 70 - 99 mg/dL    I have reviewed pertinent nursing notes, vitals, labs, and images as necessary. I have ordered labwork to follow up on as indicated.  I have reviewed the last notes from staff over past 24 hours. I have discussed patient's care plan and test results with nursing staff, CM/SW, and other staff as appropriate.    LOS: 24 days   Lewie Chamber, MD [facility] 05/01/2023, 3:39 PM

## 2023-05-02 DIAGNOSIS — U071 COVID-19: Secondary | ICD-10-CM | POA: Diagnosis not present

## 2023-05-02 DIAGNOSIS — A419 Sepsis, unspecified organism: Secondary | ICD-10-CM | POA: Diagnosis not present

## 2023-05-02 DIAGNOSIS — D649 Anemia, unspecified: Secondary | ICD-10-CM | POA: Diagnosis not present

## 2023-05-02 DIAGNOSIS — M86171 Other acute osteomyelitis, right ankle and foot: Secondary | ICD-10-CM | POA: Diagnosis not present

## 2023-05-02 LAB — BASIC METABOLIC PANEL
Anion gap: 4 — ABNORMAL LOW (ref 5–15)
BUN: 9 mg/dL (ref 6–20)
CO2: 27 mmol/L (ref 22–32)
Calcium: 7.5 mg/dL — ABNORMAL LOW (ref 8.9–10.3)
Chloride: 108 mmol/L (ref 98–111)
Creatinine, Ser: 1.21 mg/dL (ref 0.61–1.24)
GFR, Estimated: >60 mL/min (ref >60)
Glucose, Bld: 73 mg/dL (ref 70–99)
Potassium: 4.2 mmol/L (ref 3.5–5.1)
Sodium: 139 mmol/L (ref 135–145)

## 2023-05-02 LAB — GLUCOSE, CAPILLARY
Glucose-Capillary: 76 mg/dL (ref 70–99)
Glucose-Capillary: 123 mg/dL — ABNORMAL HIGH (ref 70–99)
Glucose-Capillary: 137 mg/dL — ABNORMAL HIGH (ref 70–99)
Glucose-Capillary: 110 mg/dL — ABNORMAL HIGH (ref 70–99)

## 2023-05-02 LAB — MAGNESIUM: Magnesium: 1.8 mg/dL (ref 1.7–2.4)

## 2023-05-02 NOTE — Progress Notes (Signed)
Progress Note    Alan Tapia.   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 He was also having nausea, vomiting and diarrhea. Orthopedic surgery was consulted and patient underwent right BKA 11/27 by Dr. Lajoyce Corners.  Interval History:  No issues overnight.  Continues to have cough but dyspnea is improved.  Overall remains comfortable.  Assessment and Plan:  Sepsis 2/2 to non healing right foot ulcer Previous history of left BKA -Status post right BKA 11/27 -Blood cultures negative. - Dr. Lajoyce Corners has seen again on 12/18; WV removed; okay for dry dressing changes with stump shrinker on top  Dyspnea Covid 19 infection - CXR showing some volume overload - patient declined lasix; now amenable on 12/20 - wife testing positive for covid then visiting; will test patient as well (tested positive 12/19) - patient amenable with Paxlovid  - continue incentive spirometer - BMP while on lasix   Social issues - very complex dispo issue, but CM/SW are working hard at helping figure out dispo plans; also involved are DSS and others; patient home not safe for current needs and he'll need accessible housing; also still looking for [facility] which has been a barrier  Microcytic anemia FOBT negative. S/p PRBC transfusions this hospitalization.  Combination of iron def anemia and anemia of chronic disease.   Hypertension Continue with metoprolol 50 mg BID.    Type 2 DM with hyperglycemia SSI A1c IS 6.8% no changes in meds   AKI - resolved  - renal u/s  normal   Vomiting suspect from gastroparesis. - resolved - d/c reglan; having multiple BM now       RN Pressure Injury Documentation:     Pressure Injury 04/13/23 Perineum Bilateral Stage 2 -  Partial thickness loss of dermis presenting as a shallow open injury with a red, pink wound bed without slough. perineum/scrotum masd on admission (Active)  04/13/23 1100  Location: Perineum  Location Orientation: Bilateral  Staging: Stage 2 -  Partial thickness loss of dermis presenting as a shallow open injury with a red, pink wound bed without slough.  Wound Description (Comments): perineum/scrotum masd on admission  Present on Admission:   Dressing Type Negative pressure wound therapy 04/24/23 1300      Malnutrition Type:   Nutrition Problem: Moderate Malnutrition Etiology: chronic illness     Malnutrition Characteristics:   Signs/Symptoms: energy intake < or equal to 75% for > or equal to 1 month, moderate fat depletion, moderate muscle depletion     Nutrition Interventions:   Interventions: MVI, Magic cup   Estimated body mass index is 36.82 kg/m as calculated from the following:   Height as of this encounter: 6\' 4"  (1.93 m).   Weight as of this encounter: 137.2 kg.    Old records reviewed in assessment of this patient  DVT prophylaxis:  SCD's Start: 04/08/23 1658 SCDs Start: 04/07/23 0104   Code Status:   Code Status: Full Code  Mobility Assessment (Last 72 Hours)     Mobility Assessment     Row Name 05/02/23 0818 05/01/23 2210 05/01/23 8119 04/30/23 2027 04/30/23 1548  Does patient have an order for bedrest or is patient medically unstable No - Continue assessment No - Continue assessment No - Continue assessment No - Continue assessment --   What is the highest level of mobility based on the progressive mobility assessment? Level 2 (Chairfast) - Balance while sitting on edge of bed and cannot stand Level 2 (Chairfast) - Balance while sitting on edge of bed and  cannot stand Level 2 (Chairfast) - Balance while sitting on edge of bed and cannot stand Level 2 (Chairfast) - Balance while sitting on edge of bed and cannot stand Level 2 (Chairfast) - Balance while sitting on edge of bed and cannot stand   Is the above level different from baseline mobility prior to current illness? Yes - Recommend PT order -- -- Yes - Recommend PT order --    Row Name 04/30/23 1300 04/29/23 1949         Does patient have an order for bedrest or is patient medically unstable No - Continue assessment No - Continue assessment      What is the highest level of mobility based on the progressive mobility assessment? Level 2 (Chairfast) - Balance while sitting on edge of bed and cannot stand Level 2 (Chairfast) - Balance while sitting on edge of bed and cannot stand      Is the above level different from baseline mobility prior to current illness? -- Yes - Recommend PT order               Barriers to discharge: unsafe d/c Disposition Plan:  U/K Status is: Inpt  Objective: Blood pressure (!) 145/86, pulse 82, temperature 97.9 F (36.6 C), temperature source Oral, resp. rate 18, height 6\' 4"  (1.93 m), weight (!) 139.7 kg, SpO2 100%.  Examination:  Physical Exam Constitutional:      General: He is not in acute distress.    Appearance: Normal appearance.  HENT:     Head: Normocephalic and atraumatic.     Mouth/Throat:     Mouth: Mucous membranes are moist.  Eyes:     Extraocular Movements: Extraocular movements intact.  Cardiovascular:     Rate and Rhythm: Normal rate and regular rhythm.  Pulmonary:     Effort: Pulmonary effort is normal. No respiratory distress.     Breath sounds: Normal breath sounds. No wheezing.  Abdominal:     General: Bowel sounds are normal. There is no distension.     Palpations: Abdomen is soft.     Tenderness: There is no abdominal tenderness.  Musculoskeletal:     Cervical back: Normal range of motion and neck supple.     Comments:  B/L BKA noted; R BKA with dry dressing in place with stump shrinker on top  Skin:    General: Skin is warm and dry.  Neurological:     General: No focal deficit present.     Mental Status: He is alert.  Psychiatric:        Mood and Affect: Mood normal.        Behavior: Behavior normal.      Consultants:  Orthopedic surgery  Procedures:  11/27: R BKA  Data Reviewed: Results for orders placed or performed during the hospital encounter of 04/06/23 (from the past 24 hours)  Glucose, capillary     Status: Abnormal   Collection Time: 05/01/23  4:52 PM  Result Value Ref Range   Glucose-Capillary 106 (H) 70 - 99 mg/dL  Glucose, capillary     Status:  None   Collection Time: 05/01/23  8:15 PM  Result Value Ref Range   Glucose-Capillary 88 70 - 99 mg/dL  Basic metabolic panel     Status: Abnormal   Collection Time: 05/02/23  6:02 AM  Result Value Ref Range   Sodium 139 135 - 145 mmol/L   Potassium 4.2 3.5 - 5.1 mmol/L   Chloride 108 98 - 111 mmol/L   CO2 27 22 - 32 mmol/L   Glucose, Bld 73 70 - 99 mg/dL   BUN 9 6 - 20 mg/dL   Creatinine, Ser 1.32 0.61 - 1.24 mg/dL   Calcium 7.5 (L) 8.9 - 10.3 mg/dL   GFR, Estimated >44 >01 mL/min   Anion gap 4 (L) 5 - 15  Magnesium     Status: None   Collection Time: 05/02/23  6:02 AM  Result Value Ref Range   Magnesium 1.8 1.7 - 2.4 mg/dL  Glucose, capillary     Status: None   Collection Time: 05/02/23  8:51 AM  Result Value Ref Range   Glucose-Capillary 76 70 - 99 mg/dL  Glucose, capillary     Status: Abnormal   Collection Time: 05/02/23 12:31 PM  Result Value Ref Range   Glucose-Capillary 123 (H) 70 - 99 mg/dL    I have reviewed pertinent nursing notes, vitals, labs, and images as necessary. I have ordered labwork to follow up on as indicated.  I have reviewed the last notes from staff over past 24 hours. I have discussed patient's care plan and test results with nursing staff, CM/SW, and other staff as appropriate.    LOS: 25 days    Lewie Chamber, MD [facility] 05/02/2023, 3:00 PM

## 2023-05-03 DIAGNOSIS — M86171 Other acute osteomyelitis, right ankle and foot: Secondary | ICD-10-CM | POA: Diagnosis not present

## 2023-05-03 DIAGNOSIS — D649 Anemia, unspecified: Secondary | ICD-10-CM | POA: Diagnosis not present

## 2023-05-03 DIAGNOSIS — U071 COVID-19: Secondary | ICD-10-CM | POA: Diagnosis not present

## 2023-05-03 DIAGNOSIS — A419 Sepsis, unspecified organism: Secondary | ICD-10-CM | POA: Diagnosis not present

## 2023-05-03 LAB — BASIC METABOLIC PANEL
Anion gap: 4 — ABNORMAL LOW (ref 5–15)
BUN: 9 mg/dL (ref 6–20)
CO2: 26 mmol/L (ref 22–32)
Calcium: 7.6 mg/dL — ABNORMAL LOW (ref 8.9–10.3)
Chloride: 106 mmol/L (ref 98–111)
Creatinine, Ser: 1.15 mg/dL (ref 0.61–1.24)
GFR, Estimated: >60 mL/min (ref >60)
Glucose, Bld: 113 mg/dL — ABNORMAL HIGH (ref 70–99)
Potassium: 3.6 mmol/L (ref 3.5–5.1)
Sodium: 136 mmol/L (ref 135–145)

## 2023-05-03 LAB — MAGNESIUM: Magnesium: 1.8 mg/dL (ref 1.7–2.4)

## 2023-05-03 LAB — GLUCOSE, CAPILLARY
Glucose-Capillary: 89 mg/dL (ref 70–99)
Glucose-Capillary: 93 mg/dL (ref 70–99)
Glucose-Capillary: 127 mg/dL — ABNORMAL HIGH (ref 70–99)
Glucose-Capillary: 150 mg/dL — ABNORMAL HIGH (ref 70–99)

## 2023-05-03 NOTE — Plan of Care (Signed)
  Problem: Respiratory: Goal: Ability to maintain adequate ventilation will improve Outcome: Progressing   Problem: Education: Goal: Knowledge of General Education information will improve Description: Including pain rating scale, medication(s)/side effects and non-pharmacologic comfort measures Outcome: Progressing   Problem: Nutrition: Goal: Adequate nutrition will be maintained Outcome: Progressing   Problem: Pain Management: Goal: General experience of comfort will improve Outcome: Progressing   Problem: Safety: Goal: Ability to remain free from injury will improve Outcome: Progressing

## 2023-05-03 NOTE — Progress Notes (Signed)
Progress Note    Alan Tapia.   ZHY:[id number]  DOB: 1975-11-30  DOA: 04/06/2023     26 PCP: Patient, No Pcp Per  Initial CC: right foot wound  Hospital Course: 47 year old male with past medical history significant for left lower extremity BKA, dilated cardiomyopathy, history of combined systolic and diastolic heart failure with EF 35 to 40%, hypertension, insulin-dependent diabetes, peripheral neuropathy, hyperlipidemia who presented to the emergency department on 04/06/2023 with complaints of odor, wound of his foot. He was also having nausea, vomiting and diarrhea. Orthopedic surgery was consulted and patient underwent right BKA 11/27 by Dr. Lajoyce Corners.  Interval History:  No issues overnight.  Continues to have cough but dyspnea is improved.  Overall remains comfortable.  Assessment and Plan:  Sepsis 2/2 to non healing right foot ulcer Previous history of left BKA -Status post right BKA 11/27 -Blood cultures negative. - Dr. Lajoyce Corners has seen again on 12/18; WV removed; okay for dry dressing changes with stump shrinker on top  Dyspnea Covid 19 infection - CXR showing some volume overload - patient declined lasix; now amenable on 12/20 - wife testing positive for covid then visiting; will test patient as well (tested positive 12/19) - patient amenable with Paxlovid  - continue incentive spirometer - BMP while on lasix   Social issues - very complex dispo issue, but CM/SW are working hard at helping figure out dispo plans; also involved are DSS and others; patient home not safe for current needs and he'll need accessible housing; also still looking for [facility] which has been a barrier  Microcytic anemia FOBT negative. S/p PRBC transfusions this hospitalization.  Combination of iron def anemia and anemia of chronic disease.   Hypertension Continue with metoprolol 50 mg BID.    Type 2 DM with hyperglycemia SSI A1c IS 6.8% no changes in meds   AKI - resolved  - renal u/s  normal   Vomiting suspect from gastroparesis. - resolved - d/c reglan; having multiple BM now       RN Pressure Injury Documentation:     Pressure Injury 04/13/23 Perineum Bilateral Stage 2 -  Partial thickness loss of dermis presenting as a shallow open injury with a red, pink wound bed without slough. perineum/scrotum masd on admission (Active)  04/13/23 1100  Location: Perineum  Location Orientation: Bilateral  Staging: Stage 2 -  Partial thickness loss of dermis presenting as a shallow open injury with a red, pink wound bed without slough.  Wound Description (Comments): perineum/scrotum masd on admission  Present on Admission:   Dressing Type Negative pressure wound therapy 04/24/23 1300      Malnutrition Type:   Nutrition Problem: Moderate Malnutrition Etiology: chronic illness     Malnutrition Characteristics:   Signs/Symptoms: energy intake < or equal to 75% for > or equal to 1 month, moderate fat depletion, moderate muscle depletion     Nutrition Interventions:   Interventions: MVI, Magic cup   Estimated body mass index is 36.82 kg/m as calculated from the following:   Height as of this encounter: 6\' 4"  (1.93 m).   Weight as of this encounter: 137.2 kg.    Old records reviewed in assessment of this patient  DVT prophylaxis:  SCD's Start: 04/08/23 1658 SCDs Start: 04/07/23 0104   Code Status:   Code Status: Full Code  Mobility Assessment (Last 72 Hours)     Mobility Assessment     Row Name 05/02/23 21:43:08 05/02/23 0818 05/01/23 2210 05/01/23 2952 04/30/23 2027  Does patient have an order for bedrest or is patient medically unstable No - Continue assessment No - Continue assessment No - Continue assessment No - Continue assessment No - Continue assessment   What is the highest level of mobility based on the progressive mobility assessment? Level 2 (Chairfast) - Balance while sitting on edge of bed and cannot stand Level 2 (Chairfast) - Balance while  sitting on edge of bed and cannot stand Level 2 (Chairfast) - Balance while sitting on edge of bed and cannot stand Level 2 (Chairfast) - Balance while sitting on edge of bed and cannot stand Level 2 (Chairfast) - Balance while sitting on edge of bed and cannot stand   Is the above level different from baseline mobility prior to current illness? Yes - Recommend PT order Yes - Recommend PT order -- -- Yes - Recommend PT order    Row Name 04/30/23 1548 04/30/23 1300         Does patient have an order for bedrest or is patient medically unstable -- No - Continue assessment      What is the highest level of mobility based on the progressive mobility assessment? Level 2 (Chairfast) - Balance while sitting on edge of bed and cannot stand Level 2 (Chairfast) - Balance while sitting on edge of bed and cannot stand               Barriers to discharge: unsafe d/c Disposition Plan:  U/K Status is: Inpt  Objective: Blood pressure (!) 146/89, pulse 98, temperature 98.2 F (36.8 C), temperature source Oral, resp. rate 17, height 6\' 4"  (1.93 m), weight (!) 137.5 kg, SpO2 95%.  Examination:  Physical Exam Constitutional:      General: He is not in acute distress.    Appearance: Normal appearance.  HENT:     Head: Normocephalic and atraumatic.     Mouth/Throat:     Mouth: Mucous membranes are moist.  Eyes:     Extraocular Movements: Extraocular movements intact.  Cardiovascular:     Rate and Rhythm: Normal rate and regular rhythm.  Pulmonary:     Effort: Pulmonary effort is normal. No respiratory distress.     Breath sounds: Normal breath sounds. No wheezing.  Abdominal:     General: Bowel sounds are normal. There is no distension.     Palpations: Abdomen is soft.     Tenderness: There is no abdominal tenderness.  Musculoskeletal:     Cervical back: Normal range of motion and neck supple.     Comments: B/L BKA noted; R BKA with dry dressing in place with stump shrinker on top  Skin:     General: Skin is warm and dry.  Neurological:     General: No focal deficit present.     Mental Status: He is alert.  Psychiatric:        Mood and Affect: Mood normal.        Behavior: Behavior normal.      Consultants:  Orthopedic surgery  Procedures:  11/27: R BKA  Data Reviewed: Results for orders placed or performed during the hospital encounter of 04/06/23 (from the past 24 hours)  Glucose, capillary     Status: Abnormal   Collection Time: 05/02/23  5:09 PM  Result Value Ref Range   Glucose-Capillary 137 (H) 70 - 99 mg/dL  Glucose, capillary     Status: Abnormal   Collection Time: 05/02/23 10:04 PM  Result Value Ref Range   Glucose-Capillary 110 (H) 70 -  99 mg/dL  Glucose, capillary     Status: None   Collection Time: 05/03/23  7:58 AM  Result Value Ref Range   Glucose-Capillary 89 70 - 99 mg/dL  Basic metabolic panel     Status: Abnormal   Collection Time: 05/03/23 10:09 AM  Result Value Ref Range   Sodium 136 135 - 145 mmol/L   Potassium 3.6 3.5 - 5.1 mmol/L   Chloride 106 98 - 111 mmol/L   CO2 26 22 - 32 mmol/L   Glucose, Bld 113 (H) 70 - 99 mg/dL   BUN 9 6 - 20 mg/dL   Creatinine, Ser 4.09 0.61 - 1.24 mg/dL   Calcium 7.6 (L) 8.9 - 10.3 mg/dL   GFR, Estimated >81 >19 mL/min   Anion gap 4 (L) 5 - 15  Magnesium     Status: None   Collection Time: 05/03/23 10:09 AM  Result Value Ref Range   Magnesium 1.8 1.7 - 2.4 mg/dL    I have reviewed pertinent nursing notes, vitals, labs, and images as necessary. I have ordered labwork to follow up on as indicated.  I have reviewed the last notes from staff over past 24 hours. I have discussed patient's care plan and test results with nursing staff, CM/SW, and other staff as appropriate.    LOS: 26 days   Lewie Chamber, MD [facility] 05/03/2023, 12:37 PM

## 2023-05-04 DIAGNOSIS — A419 Sepsis, unspecified organism: Secondary | ICD-10-CM | POA: Diagnosis not present

## 2023-05-04 DIAGNOSIS — R6520 Severe sepsis without septic shock: Secondary | ICD-10-CM | POA: Diagnosis not present

## 2023-05-04 DIAGNOSIS — M86171 Other acute osteomyelitis, right ankle and foot: Secondary | ICD-10-CM | POA: Diagnosis not present

## 2023-05-04 DIAGNOSIS — U071 COVID-19: Secondary | ICD-10-CM | POA: Diagnosis not present

## 2023-05-04 LAB — GLUCOSE, CAPILLARY
Glucose-Capillary: 124 mg/dL — ABNORMAL HIGH (ref 70–99)
Glucose-Capillary: 100 mg/dL — ABNORMAL HIGH (ref 70–99)
Glucose-Capillary: 132 mg/dL — ABNORMAL HIGH (ref 70–99)
Glucose-Capillary: 125 mg/dL — ABNORMAL HIGH (ref 70–99)

## 2023-05-04 MED ORDER — PHENYLEPHRINE HCL-NACL 20-0.9 MG/250ML-% IV SOLN
INTRAVENOUS | Status: AC
  Filled 2023-05-04: qty 250

## 2023-05-04 MED ORDER — PROPOFOL 1000 MG/100ML IV EMUL
INTRAVENOUS | Status: AC
  Filled 2023-05-04: qty 100

## 2023-05-04 NOTE — Progress Notes (Signed)
Progress Note    Alan Tapia.   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 He was also having nausea, vomiting and diarrhea. Orthopedic surgery was consulted and patient underwent right BKA 11/27 by Dr. Lajoyce Corners.  Interval History:  No issues overnight. Breathing remains improved and comfortable. Wife present bedside.   Assessment and Plan:  Sepsis 2/2 to non healing right foot ulcer Previous history of left BKA -Status post right BKA 11/27 -Blood cultures negative. - Dr. Lajoyce Corners has seen again on 12/18; WV removed; okay for dry dressing changes with stump shrinker on top  Dyspnea Covid 19 infection - CXR showing some volume overload - patient declined lasix; now amenable on 12/20 - wife testing positive for covid then visiting; will test patient as well (tested positive 12/19) - patient amenable with Paxlovid  - continue incentive spirometer - BMP while on lasix   Social issues - very complex dispo issue, but CM/SW are working hard at helping figure out dispo plans; also involved are DSS and others; patient home not safe for current needs and he'll need accessible housing; also still looking for [facility] which has been a barrier  Microcytic anemia FOBT negative. S/p PRBC transfusions this hospitalization.  Combination of iron def anemia and anemia of chronic disease.   Hypertension Continue with metoprolol 50 mg BID.    Type 2 DM with hyperglycemia SSI A1c IS 6.8% no changes in meds   AKI - resolved  - renal u/s normal    Vomiting suspect from gastroparesis. - resolved - d/c reglan; having multiple BM now       RN Pressure Injury Documentation:     Pressure Injury 04/13/23 Perineum Bilateral Stage 2 -  Partial thickness loss of dermis presenting as a shallow open injury with a red, pink wound bed without slough. perineum/scrotum masd on admission (Active)  04/13/23 1100  Location: Perineum  Location Orientation: Bilateral  Staging: Stage 2 -  Partial thickness loss of dermis presenting as a shallow open injury with a red, pink wound bed without slough.  Wound Description (Comments): perineum/scrotum masd on admission  Present on Admission:   Dressing Type Negative pressure wound therapy 04/24/23 1300      Malnutrition Type:   Nutrition Problem: Moderate Malnutrition Etiology: chronic illness     Malnutrition Characteristics:   Signs/Symptoms: energy intake < or equal to 75% for > or equal to 1 month, moderate fat depletion, moderate muscle depletion     Nutrition Interventions:   Interventions: MVI, Magic cup   Estimated body mass index is 36.82 kg/m as calculated from the following:   Height as of this encounter: 6\' 4"  (1.93 m).   Weight as of this encounter: 137.2 kg.    Old records reviewed in assessment of this patient  DVT prophylaxis:  SCD's Start: 04/08/23 1658 SCDs Start: 04/07/23 0104   Code Status:   Code Status: Full Code  Mobility Assessment (Last 72 Hours)     Mobility Assessment     Row Name 05/04/23 1021 05/03/23 2044 05/03/23 0900 05/02/23 21:43:08 05/02/23 0818   Does patient have an  order for bedrest or is patient medically unstable No - Continue assessment No - Continue assessment No - Continue assessment No - Continue assessment No - Continue assessment   What is the highest level of mobility based on the progressive mobility assessment? Level 2 (Chairfast) - Balance while sitting on edge of bed and cannot stand Level 2 (Chairfast) - Balance while sitting on  edge of bed and cannot stand Level 2 (Chairfast) - Balance while sitting on edge of bed and cannot stand Level 2 (Chairfast) - Balance while sitting on edge of bed and cannot stand Level 2 (Chairfast) - Balance while sitting on edge of bed and cannot stand   Is the above level different from baseline mobility prior to current illness? Yes - Recommend PT order Yes - Recommend PT order Yes - Recommend PT order Yes - Recommend PT order Yes - Recommend PT order    Row Name 05/01/23 2210           Does patient have an order for bedrest or is patient medically unstable No - Continue assessment       What is the highest level of mobility based on the progressive mobility assessment? Level 2 (Chairfast) - Balance while sitting on edge of bed and cannot stand                Barriers to discharge: unsafe d/c Disposition Plan:  U/K Status is: Inpt  Objective: Blood pressure (!) 153/93, pulse 98, temperature 97.8 F (36.6 C), temperature source Oral, resp. rate 15, height 6\' 4"  (1.93 m), weight (!) 140.7 kg, SpO2 100%.  Examination:  Physical Exam Constitutional:      General: He is not in acute distress.    Appearance: Normal appearance.  HENT:     Head: Normocephalic and atraumatic.     Mouth/Throat:     Mouth: Mucous membranes are moist.  Eyes:     Extraocular Movements: Extraocular movements intact.  Cardiovascular:     Rate and Rhythm: Normal rate and regular rhythm.  Pulmonary:     Effort: Pulmonary effort is normal. No respiratory distress.     Breath sounds: Normal breath sounds. No wheezing.  Abdominal:     General: Bowel sounds are normal. There is no distension.     Palpations: Abdomen is soft.     Tenderness: There is no abdominal tenderness.  Musculoskeletal:     Cervical back: Normal range of motion and neck supple.     Comments: B/L BKA noted; R BKA with dry dressing in place with stump shrinker on top  Skin:    General: Skin is warm and dry.  Neurological:      General: No focal deficit present.     Mental Status: He is alert.  Psychiatric:        Mood and Affect: Mood normal.        Behavior: Behavior normal.      Consultants:  Orthopedic surgery  Procedures:  11/27: R BKA  Data Reviewed: Results for orders placed or performed during the hospital encounter of 04/06/23 (from the past 24 hours)  Glucose, capillary     Status: Abnormal   Collection Time: 05/03/23  9:32 PM  Result Value Ref Range   Glucose-Capillary 150 (H) 70 - 99 mg/dL  Glucose, capillary     Status: Abnormal   Collection Time: 05/04/23  8:11 AM  Result Value Ref Range   Glucose-Capillary 124 (H) 70 - 99 mg/dL   Comment 1 Notify  RN   Glucose, capillary     Status: Abnormal   Collection Time: 05/04/23 11:52 AM  Result Value Ref Range   Glucose-Capillary 100 (H) 70 - 99 mg/dL   Comment 1 Notify RN     I have reviewed pertinent nursing notes, vitals, labs, and images as necessary. I have ordered labwork to follow up on as indicated.  I have reviewed the last notes from staff over past 24 hours. I have discussed patient's care plan and test results with nursing staff, CM/SW, and other staff as appropriate.    LOS: 27 days   Lewie Chamber, MD [facility] 05/04/2023, 3:40 PM

## 2023-05-04 NOTE — TOC Progression Note (Signed)
Transition of Care (TOC) - Progression Note    Patient Details  Name: Alan Tapia. MRN: [id number] Date of Birth: 09-16-1975  Transition of Care [facility]) CM/SW Contact  Carley Hammed, LCSW Phone Number: 05/04/2023, 10:55 AM  Clinical Narrative:     DSS family meeting canceled for today as pt and spouse are covid + will continue to address concerns and needs. TOC will continue to follow.  Expected Discharge Plan: Skilled Nursing Facility Barriers to Discharge: Insurance Authorization, Continued Medical Work up, SNF Pending bed offer  Expected Discharge Plan and Services In-house Referral: Clinical Social Work     Living arrangements for the past 2 months: Single Family Home Expected Discharge Date: 04/11/23                                     Social Determinants of Health (SDOH) Interventions SDOH Screenings   Food Insecurity: No Food Insecurity (04/07/2023)  Housing: Unknown (04/23/2023)  Recent Concern: Housing - Medium Risk (04/07/2023)  Transportation Needs: No Transportation Needs (04/07/2023)  Recent Concern: Transportation Needs - Unmet Transportation Needs (02/17/2023)   Received from [facility]  Utilities: At Risk (04/07/2023)  Alcohol Screen: Low Risk  (08/20/2022)  Financial Resource Strain: Patient Unable To Answer (08/20/2022)  Tobacco Use: Low Risk  (04/08/2023)    Readmission Risk Interventions    04/07/2023    2:34 PM  Readmission Risk Prevention Plan  Transportation Screening Complete  PCP or Specialist Appt within 3-5 Days Complete  HRI or Home Care Consult Complete  Social Work Consult for Recovery Care Planning/Counseling Complete  Palliative Care Screening Not Applicable  Medication Review [employer]) Complete

## 2023-05-04 NOTE — Plan of Care (Signed)
  Problem: Respiratory: Goal: Ability to maintain adequate ventilation will improve 05/04/2023 0744 by Jacklynn Lewis, LPN Outcome: Progressing 05/04/2023 0741 by Jacklynn Lewis, LPN Outcome: Progressing   Problem: Education: Goal: Knowledge of General Education information will improve Description: Including pain rating scale, medication(s)/side effects and non-pharmacologic comfort measures 05/04/2023 0744 by Jacklynn Lewis, LPN Outcome: Progressing 05/04/2023 0741 by Jacklynn Lewis, LPN Outcome: Progressing   Problem: Activity: Goal: Risk for activity intolerance will decrease Outcome: Progressing   Problem: Nutrition: Goal: Adequate nutrition will be maintained 05/04/2023 0744 by Jacklynn Lewis, LPN Outcome: Progressing 05/04/2023 0741 by Jacklynn Lewis, LPN Outcome: Progressing   Problem: Coping: Goal: Level of anxiety will decrease Outcome: Progressing   Problem: Elimination: Goal: Will not experience complications related to bowel motility 05/04/2023 0744 by Jacklynn Lewis, LPN Outcome: Progressing 05/04/2023 0741 by Jacklynn Lewis, LPN Outcome: Progressing   Problem: Pain Management: Goal: General experience of comfort will improve Outcome: Progressing   Problem: Safety: Goal: Ability to remain free from injury will improve 05/04/2023 0744 by Jacklynn Lewis, LPN Outcome: Progressing 05/04/2023 0741 by Jacklynn Lewis, LPN Outcome: Progressing   Problem: Skin Integrity: Goal: Risk for impaired skin integrity will decrease 05/04/2023 0744 by Jacklynn Lewis, LPN Outcome: Progressing 05/04/2023 0741 by Jacklynn Lewis, LPN Outcome: Progressing   Problem: Education: Goal: Knowledge of General Education information will improve Description: Including pain rating scale, medication(s)/side effects and non-pharmacologic comfort measures Outcome: Progressing   Problem: Nutrition: Goal: Adequate nutrition will be maintained Outcome:  Progressing   Problem: Pain Management: Goal: General experience of comfort will improve Outcome: Progressing

## 2023-05-04 NOTE — Plan of Care (Addendum)
  Problem: Respiratory: Goal: Ability to maintain adequate ventilation will improve Outcome: Progressing   Problem: Education: Goal: Knowledge of General Education information will improve Description: Including pain rating scale, medication(s)/side effects and non-pharmacologic comfort measures Outcome: Progressing   Problem: Activity: Goal: Risk for activity intolerance will decrease Outcome: Progressing   Problem: Nutrition: Goal: Adequate nutrition will be maintained Outcome: Progressing   Problem: Coping: Goal: Level of anxiety will decrease Outcome: Progressing   Problem: Elimination: Goal: Will not experience complications related to bowel motility Outcome: Progressing   Problem: Safety: Goal: Ability to remain free from injury will improve Outcome: Progressing   Problem: Skin Integrity: Goal: Risk for impaired skin integrity will decrease Outcome: Progressing   Problem: Pain Management: Goal: General experience of comfort will improve Outcome: Progressing

## 2023-05-05 DIAGNOSIS — R6520 Severe sepsis without septic shock: Secondary | ICD-10-CM | POA: Diagnosis not present

## 2023-05-05 DIAGNOSIS — M86171 Other acute osteomyelitis, right ankle and foot: Secondary | ICD-10-CM | POA: Diagnosis not present

## 2023-05-05 DIAGNOSIS — A419 Sepsis, unspecified organism: Secondary | ICD-10-CM | POA: Diagnosis not present

## 2023-05-05 DIAGNOSIS — U071 COVID-19: Secondary | ICD-10-CM | POA: Diagnosis not present

## 2023-05-05 LAB — GLUCOSE, CAPILLARY
Glucose-Capillary: 123 mg/dL — ABNORMAL HIGH (ref 70–99)
Glucose-Capillary: 120 mg/dL — ABNORMAL HIGH (ref 70–99)
Glucose-Capillary: 157 mg/dL — ABNORMAL HIGH (ref 70–99)
Glucose-Capillary: 171 mg/dL — ABNORMAL HIGH (ref 70–99)

## 2023-05-05 NOTE — Progress Notes (Signed)
Physical Therapy Treatment Patient Details Name: Alan Tapia. MRN: [id number] DOB: 05-10-1976 Today's Date: 05/05/2023   History of Present Illness The pt is a 47 yo male presenting 11/25 with nausea, vomiting, and fever as well as R foot wound. 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    PT Comments  Patient progressing with mobility.  Today worked on scooting in bed - still limited by UE weakness and body habitus.   Acquired drop-arm BSC for patient's room and worked on bed to [facility] transfer.  Patient excited to successfully get on [facility].  Patient still requiring +3 assistance.  Continue to recommend inpatient follow up therapy, <3 hours/day.      If plan is discharge home, recommend the following: Two people to help with walking and/or transfers;Assist for transportation;Assistance with cooking/housework;Help with stairs or ramp for entrance   Can travel by private vehicle     No  Equipment Recommendations  Hoyer lift;Wheelchair cushion (measurements PT);Wheelchair (measurements PT)    Recommendations for Other Services       Precautions / Restrictions Precautions Precautions: Fall Restrictions Other Position/Activity Restrictions: bilateral BKA     Mobility  Bed Mobility Overal bed mobility: Needs Assistance Bed Mobility: Rolling, Supine to Sit Rolling: Min assist, Used rails     Sit to supine: HOB elevated, Min assist   General bed mobility comments: pt raised HOB up as far as possible to aid in getting to sitting.  From there, able to manuever self to edge of bed with min assist for safety/balance.  Used headboard railing to pull self up in bed.    Transfers Overall transfer level: Needs assistance Equipment used: None Transfers: Bed to chair/wheelchair/BSC            Lateral/Scoot Transfers:  Mod assist, +2 safety/equipment, From elevated surface General transfer comment: on edge of bed, practiced scooting back in bed and then back to edge.  Then, patient wanted to try to get on [facility].  Obtained drop-arm BSC.  Patient scooted to [facility] by placing left residual limb on BSC and then scooting around so his bottom was on [facility].  Needed increased tactile and verbal cues to scoot back onto [facility].  Placed chair in front of patient and he placed right residual limb on chair to support scooting back.  Left patient on BSC to have BM and caregiver got him back to bed before alerting therapy (she reports she placed his residual limbs on bed and then he just rolled over into bed).    Ambulation/Gait                   Stairs             Wheelchair Mobility     Tilt Bed    Modified Rankin (Stroke Patients Only)       Balance Overall balance assessment: Needs assistance Sitting-balance support: Bilateral upper extremity supported, Feet unsupported Sitting balance-Leahy Scale: Good                                      Cognition Arousal: Alert Behavior During Therapy: WFL for tasks assessed/performed Overall Cognitive Status: Within Functional Limits for tasks assessed  General Comments: patient prefers to direct session/mobility        Exercises General Exercises - Upper Extremity Elbow Flexion: Strengthening, Both, 10 reps, Seated, Theraband Theraband Level (Elbow Flexion): Level 4 (Blue) Elbow Extension: Strengthening, Both, 10 reps, Seated, Theraband Theraband Level (Elbow Extension): Level 4 (Blue)    General Comments        Pertinent Vitals/Pain Pain Assessment Pain Assessment: Faces Faces Pain Scale: Hurts whole lot Pain Location: anus/buttocks Pain Descriptors / Indicators: Discomfort, Grimacing Pain Intervention(s): Limited activity within patient's tolerance, Monitored during session,  Repositioned, Patient requesting pain meds-RN notified    Home Living                          Prior Function            PT Goals (current goals can now be found in the care plan section) Progress towards PT goals: Progressing toward goals    Frequency    Min 1X/week      PT Plan      Co-evaluation              AM-PAC PT "6 Clicks" Mobility   Outcome Measure  Help needed turning from your back to your side while in a flat bed without using bedrails?: A Little Help needed moving from lying on your back to sitting on the side of a flat bed without using bedrails?: A Lot Help needed moving to and from a bed to a chair (including a wheelchair)?: A Lot Help needed standing up from a chair using your arms (e.g., wheelchair or bedside chair)?: Total Help needed to walk in hospital room?: Total Help needed climbing 3-5 steps with a railing? : Total 6 Click Score: 10    End of Session Equipment Utilized During Treatment: Gait belt Activity Tolerance: Patient tolerated treatment well Patient left: in bed;with call bell/phone within reach;with family/visitor present Nurse Communication: Mobility status;Patient requests pain meds PT Visit Diagnosis: Other abnormalities of gait and mobility (R26.89);Muscle weakness (generalized) (M62.81);Pain Pain - Right/Left: Right Pain - part of body: Leg     Time: 1415 (340 p)-1515 (400 p) PT Time Calculation (min) (ACUTE ONLY): 60 min  Charges:    $Therapeutic Activity: 53-67 mins PT General Charges $$ ACUTE PT VISIT: 1 Visit                    05/05/2023 Delray Alt, PT Acute Rehabilitation Services Office:  [phone]    Alan Tapia 05/05/2023, 4:28 PM

## 2023-05-05 NOTE — Progress Notes (Signed)
Progress Note    [name]   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 He was also having nausea, vomiting and diarrhea. Orthopedic surgery was consulted and patient underwent right BKA 11/27 by Dr. Lajoyce Corners.  Interval History:  No issues overnight. Breathing remains improved and comfortable. No concerns today.   Assessment and Plan:  Sepsis 2/2 to non healing right foot ulcer Previous history of left BKA -Status post right BKA 11/27 -Blood cultures negative. - Dr. Lajoyce Corners has seen again on 12/18; WV removed; okay for daily dry dressing changes with stump shrinker on top  Dyspnea Covid 19 infection - CXR showing some volume overload - patient declined lasix; now amenable on 12/20; 3 day course given  - wife testing positive for covid then visiting; will test patient as well (tested positive 12/19) - patient amenable with Paxlovid; complete course  - continue incentive spirometer  Social issues - very complex dispo issue, but CM/SW are working hard at helping figure out dispo plans; also involved are DSS and others; patient home not safe for current needs and he'll need accessible housing; also still looking for [facility] which has been a barrier  Microcytic anemia FOBT negative. S/p PRBC transfusions this hospitalization.  Combination of iron def anemia and anemia of chronic disease.   Hypertension Continue with metoprolol 50 mg BID.    Type 2 DM with hyperglycemia SSI A1c IS 6.8% no changes in meds   AKI - resolved  - renal u/s  normal   Vomiting suspect from gastroparesis. - resolved - d/c reglan; having multiple BM now       RN Pressure Injury Documentation:     Pressure Injury 04/13/23 Perineum Bilateral Stage 2 -  Partial thickness loss of dermis presenting as a shallow open injury with a red, pink wound bed without slough. perineum/scrotum masd on admission (Active)  04/13/23 1100  Location: Perineum  Location Orientation: Bilateral  Staging: Stage 2 -  Partial thickness loss of dermis presenting as a shallow open injury with a red, pink wound bed without slough.  Wound Description (Comments): perineum/scrotum masd on admission  Present on Admission:   Dressing Type Negative pressure wound therapy 04/24/23 1300      Malnutrition Type:   Nutrition Problem: Moderate Malnutrition Etiology: chronic illness     Malnutrition Characteristics:   Signs/Symptoms: energy intake < or equal to 75% for > or equal to 1 month, moderate fat depletion, moderate muscle depletion     Nutrition Interventions:   Interventions: MVI, Magic cup   Estimated body mass index is 36.82 kg/m as calculated from the following:   Height as of this encounter: 6\' 4"  (1.93 m).   Weight as of this encounter: 137.2 kg.    Old records reviewed in assessment of this patient  DVT prophylaxis:  SCD's Start: 04/08/23 1658 SCDs Start: 04/07/23 0104   Code Status:   Code Status: Full Code  Mobility Assessment (Last 72 Hours)     Mobility Assessment     Row Name 05/05/23 1304 05/04/23 2125 05/04/23 1021 05/03/23 2044 05/03/23 0900   Does patient  have an order for bedrest or is patient medically unstable No - Continue assessment No - Continue assessment No - Continue assessment No - Continue assessment No - Continue assessment   What is the highest level of mobility based on the progressive mobility assessment? Level 2 (Chairfast) - Balance while sitting on edge of bed and cannot stand Level 2 (Chairfast) - Balance while sitting  on edge of bed and cannot stand Level 2 (Chairfast) - Balance while sitting on edge of bed and cannot stand Level 2 (Chairfast) - Balance while sitting on edge of bed and cannot stand Level 2 (Chairfast) - Balance while sitting on edge of bed and cannot stand   Is the above level different from baseline mobility prior to current illness? Yes - Recommend PT order -- Yes - Recommend PT order Yes - Recommend PT order Yes - Recommend PT order    Row Name 05/02/23 21:43:08           Does patient have an order for bedrest or is patient medically unstable No - Continue assessment       What is the highest level of mobility based on the progressive mobility assessment? Level 2 (Chairfast) - Balance while sitting on edge of bed and cannot stand       Is the above level different from baseline mobility prior to current illness? Yes - Recommend PT order                Barriers to discharge: unsafe d/c Disposition Plan:  U/K Status is: Inpt  Objective: Blood pressure (!) 154/90, pulse 82, temperature 98.7 F (37.1 C), temperature source Oral, resp. rate 20, height 6\' 4"  (1.93 m), weight (!) 141.9 kg, SpO2 98%.  Examination:  Physical Exam Constitutional:      General: He is not in acute distress.    Appearance: Normal appearance.  HENT:     Head: Normocephalic and atraumatic.     Mouth/Throat:     Mouth: Mucous membranes are moist.  Eyes:     Extraocular Movements: Extraocular movements intact.  Cardiovascular:     Rate and Rhythm: Normal rate and regular rhythm.  Pulmonary:     Effort: Pulmonary effort is normal. No respiratory distress.     Breath sounds: Normal breath sounds. No wheezing.  Abdominal:     General: Bowel sounds are normal. There is no distension.     Palpations: Abdomen is soft.     Tenderness: There is no abdominal tenderness.  Musculoskeletal:     Cervical back: Normal range of motion and neck supple.     Comments: B/L BKA noted; R BKA with dry dressing in place  with stump shrinker on top  Skin:    General: Skin is warm and dry.  Neurological:     General: No focal deficit present.     Mental Status: He is alert.  Psychiatric:        Mood and Affect: Mood normal.        Behavior: Behavior normal.     Comments: Slightly odd affect      Consultants:  Orthopedic surgery  Procedures:  11/27: R BKA  Data Reviewed: Results for orders placed or performed during the hospital encounter of 04/06/23 (from the past 24 hours)  Glucose, capillary     Status: Abnormal   Collection Time: 05/04/23  4:38 PM  Result Value Ref Range   Glucose-Capillary 132 (H) 70 - 99 mg/dL   Comment 1 Notify RN  Glucose, capillary     Status: Abnormal   Collection Time: 05/04/23 10:32 PM  Result Value Ref Range   Glucose-Capillary 125 (H) 70 - 99 mg/dL  Glucose, capillary     Status: Abnormal   Collection Time: 05/05/23  8:45 AM  Result Value Ref Range   Glucose-Capillary 123 (H) 70 - 99 mg/dL  Glucose, capillary     Status: Abnormal   Collection Time: 05/05/23 12:05 PM  Result Value Ref Range   Glucose-Capillary 120 (H) 70 - 99 mg/dL    I have reviewed pertinent nursing notes, vitals, labs, and images as necessary. I have ordered labwork to follow up on as indicated.  I have reviewed the last notes from staff over past 24 hours. I have discussed patient's care plan and test results with nursing staff, CM/SW, and other staff as appropriate.    LOS: 28 days   Lewie Chamber, MD [facility] 05/05/2023, 2:05 PM

## 2023-05-05 NOTE — Plan of Care (Signed)
  Problem: Respiratory: Goal: Ability to maintain adequate ventilation will improve Outcome: Progressing   Problem: Education: Goal: Knowledge of General Education information will improve Description: Including pain rating scale, medication(s)/side effects and non-pharmacologic comfort measures Outcome: Progressing   Problem: Nutrition: Goal: Adequate nutrition will be maintained Outcome: Progressing

## 2023-05-06 DIAGNOSIS — A419 Sepsis, unspecified organism: Secondary | ICD-10-CM | POA: Diagnosis not present

## 2023-05-06 DIAGNOSIS — N179 Acute kidney failure, unspecified: Secondary | ICD-10-CM | POA: Diagnosis not present

## 2023-05-06 DIAGNOSIS — R6520 Severe sepsis without septic shock: Secondary | ICD-10-CM | POA: Diagnosis not present

## 2023-05-06 LAB — GLUCOSE, CAPILLARY
Glucose-Capillary: 110 mg/dL — ABNORMAL HIGH (ref 70–99)
Glucose-Capillary: 114 mg/dL — ABNORMAL HIGH (ref 70–99)
Glucose-Capillary: 131 mg/dL — ABNORMAL HIGH (ref 70–99)

## 2023-05-06 MED ORDER — HYDROXYZINE HCL 25 MG PO TABS
25.0000 mg | ORAL_TABLET | Freq: Three times a day (TID) | ORAL | Status: DC | PRN
  Administered 2023-05-14 – 2023-06-27 (×29): 25 mg via ORAL
  Filled 2023-05-06 (×30): qty 1

## 2023-05-06 NOTE — Progress Notes (Signed)
PROGRESS NOTE    Alan Tapia.  ZOX:[id number] DOB: May 26, 1975 DOA: 04/06/2023 PCP: Patient, No Pcp Per  No chief complaint on file.   Hospital Course:  Alan Nitka. is 47 y.o. male with extensive hospital course.  Patient initially presented septic due to nonhealing right foot ulcer.  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 Lajoyce Corners.  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  His postoperative course has been complicated by COVID-19, and social issues making dispo complex.  Subjective: On evaluation today his wife is complaining of a new skin finding all over his body.  She reports this may have been there for a long time but she just noticed. Pt reports some itching.   Objective: Vitals:   05/06/23 0009 05/06/23 0337 05/06/23 0419 05/06/23 1602  BP: (!) 150/87 (!) 141/79  (!) 149/86  Pulse: 84 80  84  Resp:    18  Temp: 98.2 F (36.8 C) 97.8 F (36.6 C)  98.1 F (36.7 C)  TempSrc: Oral Oral  Oral  SpO2: 100% 100%  100%  Weight:   (!) 138.3 kg   Height:        Intake/Output Summary (Last 24 hours) at 05/06/2023 1619 Last data filed at 05/06/2023 1246 Gross per 24 hour  Intake 480 ml  Output --  Net 480 ml   Filed Weights   05/04/23 0430 05/05/23 0600 05/06/23 0419  Weight: (!) 140.7 kg (!) 141.9 kg (!) 138.3 kg    Examination: General exam: Appears calm and comfortable, NAD, obese Respiratory system: No work of breathing, symmetric chest wall expansion Cardiovascular system: S1 & S2 heard, RRR.  Gastrointestinal system: Abdomen is nondistended, soft and nontender.  Neuro: Alert and oriented. No focal neurological deficits. Extremities: Bilateral BKA noted.  Right BKA with stump shrinker and dry dressing.  No active oozing.  Dressing clean/dry/intact Skin: Striae distensae over all  extremities and abdomen, chronic appearing hyperpigmentation on right upper extremity Psychiatry: Calm, mood and affect appropriate for situation.  Assessment & Plan:  Principal Problem:   Sepsis (HCC) Active Problems:   Acute osteomyelitis of right foot (HCC)   Acute on chronic anemia   Metabolic acidosis   CAP (community acquired pneumonia)   Diarrhea   Subacute osteomyelitis of right foot (HCC)   Subacute osteomyelitis, right ankle and foot (HCC)   Essential hypertension   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)   Insulin dependent type 2 diabetes mellitus (HCC)   CKD (chronic kidney disease), stage II   Hx of left BKA (HCC)   PVD (peripheral vascular disease) (HCC)   Cutaneous abscess of right foot   Malnutrition of moderate degree   COVID-19 virus infection   Pressure injury of skin    Sepsis secondary to nonhealing right foot ulcer - Sepsis resolved - Status post right BKA 11/27 - Blood cultures negative - Dr. Lajoyce Corners seen on 12/18, wound VAC removed.  Continue with daily dry dressing changes with stump shrinker on top  Dyspnea COVID-19 infection (12/19) - Chest x-ray with some volume overload, status post 3-day course of Lasix - No further dyspnea - Status post course of Paxlovid - Continue incentive spirometry  Microcytic anemia - FOBT negative - Status post PRBC transfusions during this hospitalization - Multifactorial from iron deficiency anemia and anemia of chronic disease  Hypertension - Continue with metoprolol, titrate as needed  Type 2 diabetes with hyperglycemia - Hemoglobin A1c 6.8% - Continue with sliding scale insulin, titrate up as tolerated  AKI - Resolved - Renal ultrasound within normal limits  Vomiting - Suspected secondary to gastroparesis, resolved now.  Discontinue Reglan.  Complex disposition planning - Case management and social work working on [employer].  DSS also involved.  Reportedly  patient's home is not safe for current needs.  He will need accessible housing.  Home health is also been a barrier. - TOC consulted and working on it  Pressure Injury 04/13/23 Perineum Bilateral Stage 2 -  Partial thickness loss of dermis presenting as a shallow open injury with a red, pink wound bed without slough. perineum/scrotum masd on admission (Active)  04/13/23 1100  Location: Perineum  Location Orientation: Bilateral  Staging: Stage 2 -  Partial thickness loss of dermis presenting as a shallow open injury with a red, pink wound bed without slough.  Wound Description (Comments): perineum/scrotum masd on admission  Present on Admission:       DVT prophylaxis: lovenox   Code Status: Full Code Family Communication: Wife at bedside for discussin Disposition:  Status is: Inpatient Remains inpatient appropriate because: dispo pending    Consultants:    Treatment Team:  Consulting Physician: Nadara Mustard, MD  Procedures:  R BKA  Antimicrobials:  Anti-infectives (From admission, onward)    Start     Dose/Rate Route Frequency Ordered Stop   04/30/23 1800  nirmatrelvir/ritonavir (PAXLOVID) 3 tablet        3 tablet Oral 2 times daily 04/30/23 1621 05/05/23 0820   04/09/23 1345  doxycycline (VIBRAMYCIN) 100 mg in dextrose 5 % 250 mL IVPB        100 mg 125 mL/hr over 120 Minutes Intravenous Every 12 hours 04/09/23 1252 04/09/23 1746   04/08/23 1300  levofloxacin (LEVAQUIN) IVPB 500 mg  Status:  Discontinued        500 mg 100 mL/hr over 60 Minutes Intravenous On call to O.R. 04/08/23 1229 04/08/23 1648   04/08/23 1300  vancomycin (VANCOREADY) IVPB 1500 mg/300 mL        1,500 mg 100 mL/hr over 180 Minutes Intravenous On call to O.R. 04/08/23 1229 04/08/23 1433   04/08/23 1237  vancomycin HCl (VANCOREADY) 1500 MG/300ML IVPB       Note to Pharmacy: [name] D: cabinet override      04/08/23 1237 04/08/23 1439   04/07/23 0800  piperacillin-tazobactam (ZOSYN) IVPB 3.375 g         3.375 g 12.5 mL/hr over 240 Minutes Intravenous Every 8 hours 04/07/23 0108 04/09/23 2108   04/07/23 0100  doxycycline (VIBRAMYCIN) 100 mg in dextrose 5 % 250 mL IVPB  Status:  Discontinued        100 mg 125 mL/hr over 120 Minutes Intravenous Every 12 hours 04/07/23 0057 04/09/23 1252   04/07/23 0000  ceFEPIme (MAXIPIME) 2 g in sodium chloride 0.9 % 100 mL IVPB  Status:  Discontinued        2 g 200 mL/hr over 30 Minutes Intravenous Every 8 hours 04/06/23 2349 04/07/23 0108   04/06/23 2330  metroNIDAZOLE (FLAGYL) IVPB 500 mg        500 mg 100 mL/hr over 60 Minutes Intravenous  Once 04/06/23 2321 04/07/23 0032   04/06/23 2300  piperacillin-tazobactam (ZOSYN) IVPB 3.375 g  Status:  Discontinued        3.375 g 100 mL/hr over 30  Minutes Intravenous  Once 04/06/23 2257 04/06/23 2320   04/06/23 2300  vancomycin (VANCOREADY) IVPB 2000 mg/400 mL  Status:  Discontinued        2,000 mg 200 mL/hr over 120 Minutes Intravenous  Once 04/06/23 2257 04/06/23 2312       Data Reviewed: I have personally reviewed following labs and imaging studies CBC: No results for input(s): "WBC", "NEUTROABS", "HGB", "HCT", "MCV", "PLT" in the last 168 hours. Basic Metabolic Panel: Recent Labs  Lab 05/02/23 0602 05/03/23 1009  NA 139 136  K 4.2 3.6  CL 108 106  CO2 27 26  GLUCOSE 73 113*  BUN 9 9  CREATININE 1.21 1.15  CALCIUM 7.5* 7.6*  MG 1.8 1.8   GFR: Estimated Creatinine Clearance: 120.6 mL/min (by C-G formula based on SCr of 1.15 mg/dL). Liver Function Tests: No results for input(s): "AST", "ALT", "ALKPHOS", "BILITOT", "PROT", "ALBUMIN" in the last 168 hours. CBG: Recent Labs  Lab 05/05/23 1205 05/05/23 1730 05/05/23 1925 05/06/23 1047 05/06/23 1600  GLUCAP 120* 157* 171* 110* 114*    Recent Results (from the past 240 hours)  SARS Coronavirus 2 by RT PCR (hospital order, performed in [facility] hospital lab) *cepheid single result test* Anterior Nasal Swab     Status: Abnormal    Collection Time: 04/30/23  1:10 PM   Specimen: Anterior Nasal Swab  Result Value Ref Range Status   SARS Coronavirus 2 by RT PCR POSITIVE (A) NEGATIVE Final    Comment: Performed at [facility], [address] [place], [name] [address]     Radiology Studies: No results found.  Scheduled Meds:  (feeding supplement) PROSource Plus  30 mL Oral BID BM   docusate sodium  100 mg Oral BID   enoxaparin (LOVENOX) injection  70 mg Subcutaneous Daily   feeding supplement  237 mL Oral BID BM   folic acid  1 mg Oral Daily   Gerhardt's butt cream   Topical BID   insulin aspart  0-9 Units Subcutaneous TID WC   metoprolol tartrate  50 mg Oral BID   multivitamin with minerals  1 tablet Oral Daily   pantoprazole  40 mg Oral BID   saccharomyces boulardii  250 mg Oral BID   sodium chloride flush  10 mL Intravenous Q12H   sodium chloride flush  3 mL Intravenous Q12H   Continuous Infusions:   LOS: 29 days    Time spent:  Debarah Crape, DO [facility]  To contact the attending physician between 7A-7P please use Epic Chat. To contact the covering physician during after hours 7P-7A, please review Amion.   05/06/2023, 4:19 PM   *This document has been created with the assistance of dictation software. Please excuse typographical errors. *

## 2023-05-07 DIAGNOSIS — A419 Sepsis, unspecified organism: Principal | ICD-10-CM

## 2023-05-07 DIAGNOSIS — N179 Acute kidney failure, unspecified: Secondary | ICD-10-CM | POA: Diagnosis not present

## 2023-05-07 DIAGNOSIS — R6520 Severe sepsis without septic shock: Secondary | ICD-10-CM

## 2023-05-07 LAB — GLUCOSE, CAPILLARY
Glucose-Capillary: 113 mg/dL — ABNORMAL HIGH (ref 70–99)
Glucose-Capillary: 109 mg/dL — ABNORMAL HIGH (ref 70–99)
Glucose-Capillary: 84 mg/dL (ref 70–99)
Glucose-Capillary: 87 mg/dL (ref 70–99)

## 2023-05-07 NOTE — Progress Notes (Signed)
Occupational Therapy Treatment Patient Details Name: Alan Tapia. MRN: [id number] DOB: 1976-01-13 Today's Date: 05/07/2023   History of present illness The pt is a 47 yo male presenting 11/25 with nausea, vomiting, and fever as well as R foot wound. 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   OT comments  Patient seated on EOB upon entry. Patient asking to donn paper scrubs before attempting transfers. Patient required max assist and was unable to get over hips. During attempting to donn pants patient performed peri area bathing with mod assist for bottom. A-P transfer attempted to [facility] with use of bed pad and max assist +2. Patient unable to complete transfer due to fatigue but was mod assist to scoot forward back to EOB. Patient performed UE HEP while seated on EOB with blue therapy band and was min assist to go to supine. Patient to continue to be followed by acute OT to address toilet transfers and self care.       If plan is discharge home, recommend the following:  Two people to help with walking and/or transfers;Two people to help with bathing/dressing/bathroom;Assistance with cooking/housework;Assist for transportation;Help with stairs or ramp for entrance   Equipment Recommendations  Hoyer lift    Recommendations for Other Services      Precautions / Restrictions Precautions Precautions: Fall Restrictions Weight Bearing Restrictions Per Provider Order: Yes RLE Weight Bearing Per Provider Order: Non weight bearing Other Position/Activity Restrictions: bilateral BKA       Mobility Bed Mobility Overal bed mobility: Needs Assistance Bed Mobility: Rolling, Sit to Supine Rolling: Min assist, Used rails     Sit to supine: HOB elevated, Min assist   General bed mobility comments: Patient seated on EOB upon  entry and went to supine at end of session    Transfers Overall transfer level: Needs assistance Equipment used: None Transfers: Bed to chair/wheelchair/BSC         Anterior-Posterior transfers: Max assist, +2 physical assistance   General transfer comment: attempted A-P transfer to [facility] with patient too fatigued to complete posterior transfer but was able to scoot forward, back to EOB with mod assist     Balance Overall balance assessment: Needs assistance Sitting-balance support: Bilateral upper extremity supported, Feet unsupported Sitting balance-Leahy Scale: Good Sitting balance - Comments: seated on EOB upon entry                                   ADL either performed or assessed with clinical judgement   ADL Overall ADL's : Needs assistance/impaired             Lower Body Bathing: Moderate assistance;Bed level Lower Body Bathing Details (indicate cue type and reason): patient able to clean bottom with mod assist while rolling side to side     Lower Body Dressing: Maximal assistance;Bed level Lower Body Dressing Details (indicate cue type and reason): donned pants with max assist and patient rolling side to side Toilet Transfer: Maximal assistance;+2 for physical assistance;BSC/3in1 Toilet Transfer Details (indicate cue type and reason): attempted A-P transfer to [facility] but patient too fatigued to complete posterior transfer but was able to scoot forward in bed to return to EOB  Extremity/Trunk Assessment              Vision       Perception     Praxis      Cognition Arousal: Alert Behavior During Therapy: WFL for tasks assessed/performed Overall Cognitive Status: Within Functional Limits for tasks assessed                                          Exercises Exercises: General Upper Extremity General Exercises - Upper Extremity Shoulder Flexion: Strengthening, Both, 10 reps, Seated,  Theraband Theraband Level (Shoulder Flexion): Level 4 (Blue) Shoulder Horizontal ABduction: Strengthening, Both, 10 reps, Seated, Theraband Theraband Level (Shoulder Horizontal Abduction): Level 4 (Blue) Elbow Flexion: Strengthening, Both, 10 reps, Seated, Theraband Theraband Level (Elbow Flexion): Level 4 (Blue) Elbow Extension: Strengthening, Both, 10 reps, Seated, Theraband Theraband Level (Elbow Extension): Level 4 (Blue)    Shoulder Instructions       General Comments      Pertinent Vitals/ Pain       Pain Assessment Pain Assessment: Faces Faces Pain Scale: Hurts even more Pain Location: anus/buttocks Pain Descriptors / Indicators: Discomfort, Grimacing Pain Intervention(s): Limited activity within patient's tolerance, Monitored during session, Repositioned  Home Living                                          Prior Functioning/Environment              Frequency  Min 1X/week        Progress Toward Goals  OT Goals(current goals can now be found in the care plan section)  Progress towards OT goals: Progressing toward goals  Acute Rehab OT Goals Patient Stated Goal: increase ability to perform transfers OT Goal Formulation: With patient Time For Goal Achievement: 05/12/23 Potential to Achieve Goals: Good ADL Goals Pt Will Perform Grooming: with set-up;sitting Pt Will Perform Upper Body Bathing: with set-up;sitting Pt Will Perform Upper Body Dressing: with modified independence;sitting Pt Will Transfer to Toilet: with mod assist;with transfer board;bedside commode Pt Will Perform Toileting - Clothing Manipulation and hygiene: with mod assist;sitting/lateral leans Pt/caregiver will Perform Home Exercise Program: Increased strength;Both right and left upper extremity;With theraband;Independently;With written HEP provided  Plan      Co-evaluation                 AM-PAC OT "6 Clicks" Daily Activity     Outcome Measure   Help from  another person eating meals?: A Little Help from another person taking care of personal grooming?: A Little Help from another person toileting, which includes using toliet, bedpan, or urinal?: Total Help from another person bathing (including washing, rinsing, drying)?: A Lot Help from another person to put on and taking off regular upper body clothing?: A Little Help from another person to put on and taking off regular lower body clothing?: Total 6 Click Score: 13    End of Session    OT Visit Diagnosis: Other abnormalities of gait and mobility (R26.89);Muscle weakness (generalized) (M62.81);Other (comment)   Activity Tolerance Patient tolerated treatment well   Patient Left in bed;with call bell/phone within reach;with bed alarm set   Nurse Communication Mobility status        Time: 9629-5284 OT Time Calculation (min): 45 min  Charges: OT General Charges $OT  Visit: 1 Visit OT Treatments $Self Care/Home Management : 8-22 mins $Therapeutic Activity: 8-22 mins $Therapeutic Exercise: 8-22 mins  Alfonse Flavors, OTA Acute Rehabilitation Services  Office [phone]   Dewain Penning 05/07/2023, 2:59 PM

## 2023-05-07 NOTE — Plan of Care (Signed)
Patient felt short of breath and was educated and encouraged to use incentive spirometer.

## 2023-05-07 NOTE — Progress Notes (Signed)
PROGRESS NOTE  Alan Tapia. ONG:[id number] DOB: Mar 16, 1976 DOA: 04/06/2023 PCP: Patient, No Pcp Per  Brief History   Alan Tapia. is 47 y.o. male with extensive hospital course.  Patient initially presented septic due to nonhealing right foot ulcer.  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 Lajoyce Corners.  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  His postoperative course has been complicated by COVID-19, and social issues making dispo complex.   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 Lajoyce Corners seen on 12/18, wound VAC removed.  Continue with daily dry dressing changes with stump shrinker on top   Dyspnea COVID-19 infection (12/19) - Chest x-ray with some volume overload, status post 3-day course of Lasix - No further dyspnea - Status post course of Paxlovid - Continue incentive spirometry   Microcytic anemia - FOBT negative - Status post PRBC transfusions during this hospitalization - Multifactorial from iron deficiency anemia and anemia of  chronic disease   Hypertension - Continue with metoprolol, titrate as needed   Type 2 diabetes with hyperglycemia - Hemoglobin A1c 6.8% - Continue with sliding scale insulin, titrate up as tolerated   AKI - Resolved - Renal ultrasound within normal limits   Vomiting - Suspected secondary to gastroparesis, resolved now.  Discontinue Reglan.   Complex disposition planning - Case management and social work working on [employer].  DSS also involved.  Reportedly patient's home is not safe for current needs.  He will need accessible housing.  Home health is also been a barrier. - TOC consulted and working on it   Pressure Injury 04/13/23 Perineum Bilateral Stage 2 -  Partial thickness loss of dermis presenting as a shallow open injury with a red, pink wound bed without slough. perineum/scrotum masd on admission (Active)  04/13/23 1100  Location: Perineum  Location Orientation: Bilateral  Staging: Stage 2 -  Partial thickness loss of dermis presenting as a shallow open injury with a red, pink wound bed without slough.  Wound Description (Comments): perineum/scrotum masd on admission  Present on Admission:           DVT prophylaxis: lovenox   Code Status: Full Code Family Communication: Wife at bedside for discussin Disposition:  Status is: Inpatient Remains inpatient appropriate because: dispo pending     Consultants:    Treatment Team:  Consulting Physician: Nadara Mustard, MD   Procedures:  R BKA   Fran Lowes, DO [facility] Direct contact: see [email or web address]  7PM-7AM contact night  coverage as above 05/07/2023, 4:46 PM  LOS: 30 days   Interval History/Subjective  The patient is sitting up on the edge of his bed. He states that he is trying to get used to the new amputation. No new complaints.  Objective   Vitals:  Vitals:   05/06/23 2249 05/07/23 0437  BP: (!) 151/89 (!) 167/94  Pulse: 100 100  Resp: 20 20  Temp: 97.7 F (36.5 C) 97.8 F (36.6 C)   SpO2: 100% 98%    Exam:  Constitutional:  The patient is awake, alert, and oriented x 3. No acute distress. Eyes:  pupils and irises appear normal Normal lids and conjunctivae ENMT:  grossly normal hearing  Lips appear normal external ears, nose appear normal Oropharynx: mucosa, tongue,posterior pharynx appear normal Neck:  neck appears normal, no masses, normal ROM, supple no thyromegaly Respiratory:  No increased work of breathing. No wheezes, rales, or rhonchi No tactile fremitus Cardiovascular:  Regular rate and rhythm No murmurs, ectopy, or gallups. No lateral PMI. No thrills. Abdomen:  Abdomen is soft, non-tender, non-distended No hernias, masses, or organomegaly Normoactive bowel sounds.  Musculoskeletal:  No cyanosis, clubbing, or edema Bilateral BKA's. Skin:  No rashes, lesions, ulcers palpation of skin: no induration or nodules Neurologic:  CN 2-12 intact Sensation all 4 extremities intact Psychiatric:  Mental status Mood, affect appropriate Orientation to person, place, time  judgment and insight appear intact  I have personally reviewed the following:   Today's Data   Vitals  Lab Data  CBC    Component Value Date/Time   WBC 8.5 04/26/2023 1556   RBC 3.52 (L) 04/26/2023 1556   HGB 8.8 (L) 04/26/2023 1556   HCT 29.4 (L) 04/26/2023 1556   PLT 407 (H) 04/26/2023 1556   MCV 83.5 04/26/2023 1556   MCH 25.0 (L) 04/26/2023 1556   MCHC 29.9 (L) 04/26/2023 1556   RDW 22.4 (H) 04/26/2023 1556   LYMPHSABS 1.8 04/26/2023 1556   MONOABS 0.7 04/26/2023 1556   EOSABS 0.2 04/26/2023 1556   BASOSABS 0.0 04/26/2023 1556      Latest Ref Rng & Units 05/03/2023   10:09 AM 05/02/2023    6:02 AM 04/28/2023    6:21 AM  BMP  Glucose 70 - 99 mg/dL 161  73  096   BUN 6 - 20 mg/dL 9  9  8    Creatinine 0.61 - 1.24 mg/dL 0.45  4.09  8.11   Sodium 135 - 145 mmol/L 136  139  139   Potassium 3.5 - 5.1 mmol/L 3.6  4.2  4.0   Chloride 98 - 111 mmol/L 106  108   110   CO2 22 - 32 mmol/L 26  27  24    Calcium 8.9 - 10.3 mg/dL 7.6  7.5  7.5      Micro Data   Results for orders placed or performed during the hospital encounter of 04/06/23  Culture, blood (Routine x 2)     Status: None   Collection Time: 04/06/23 10:20 PM   Specimen: BLOOD  Result Value Ref Range Status   Specimen Description   Final    BLOOD LEFT ANTECUBITAL Performed at [facility], [address], [place], [name] [address]    Special Requests   Final    BOTTLES DRAWN AEROBIC AND ANAEROBIC Blood Culture adequate volume Performed at [facility], [address] [place], [name] [address]    Culture   Final    NO GROWTH 5  DAYS Performed at [facility], [address] [place], [name] [address]    Report Status 04/11/2023 FINAL  Final  Culture, blood (Routine x 2)     Status: None   Collection Time: 04/06/23 10:32 PM   Specimen: BLOOD  Result Value Ref Range Status   Specimen Description   Final    BLOOD BLOOD LEFT FOREARM Performed at [facility], [address], [place], [name] [address]    Special Requests   Final    BOTTLES DRAWN AEROBIC AND ANAEROBIC Blood Culture results may not be optimal due to an inadequate volume of blood received in culture bottles Performed at [facility], [address] [place], [name] [address]    Culture   Final    NO GROWTH 5 DAYS Performed at [facility], [address] [place], [name] [address]    Report Status 04/11/2023 FINAL  Final  Respiratory (~20 pathogens) panel by PCR     Status: None   Collection Time: 04/07/23  1:04 AM   Specimen: Nasopharyngeal Swab; Respiratory  Result Value Ref Range Status   Adenovirus NOT DETECTED NOT DETECTED Final   Coronavirus 229E NOT DETECTED NOT DETECTED Final    Comment: (NOTE) The Coronavirus on the Respiratory Panel, DOES NOT test for the novel  Coronavirus (2019 nCoV)    Coronavirus  HKU1 NOT DETECTED NOT DETECTED Final   Coronavirus NL63 NOT DETECTED NOT DETECTED Final   Coronavirus OC43 NOT DETECTED NOT DETECTED Final   Metapneumovirus NOT DETECTED NOT DETECTED Final   Rhinovirus / Enterovirus NOT DETECTED NOT DETECTED Final   Influenza A NOT DETECTED NOT DETECTED Final   Influenza B NOT DETECTED NOT DETECTED Final   Parainfluenza Virus 1 NOT DETECTED NOT DETECTED Final   Parainfluenza Virus 2 NOT DETECTED NOT DETECTED Final   Parainfluenza Virus 3 NOT DETECTED NOT DETECTED Final   Parainfluenza Virus 4 NOT DETECTED NOT DETECTED Final   Respiratory Syncytial Virus NOT DETECTED NOT DETECTED Final   Bordetella pertussis NOT DETECTED NOT DETECTED Final   Bordetella Parapertussis NOT DETECTED NOT DETECTED Final   Chlamydophila pneumoniae NOT DETECTED NOT DETECTED Final   Mycoplasma pneumoniae NOT DETECTED NOT DETECTED Final    Comment: Performed at [facility], [address], [place], [name] [address]  Culture, blood (routine x 2) Call MD if unable to obtain prior to antibiotics being given     Status: None   Collection Time: 04/07/23  5:40 AM   Specimen: BLOOD RIGHT ARM  Result Value Ref Range Status   Specimen Description   Final    BLOOD RIGHT ARM Performed at [facility], [address] [place], [name] [address]    Special Requests   Final    BOTTLES DRAWN AEROBIC ONLY Blood Culture adequate volume Performed at [facility], [address], [place], [name] [address]    Culture   Final    NO GROWTH 5 DAYS Performed at [facility], [address], [place], [name] [address]    Report Status 04/12/2023 FINAL  Final  Culture, blood (routine x 2) Call MD if unable to obtain prior to antibiotics being given     Status: None   Collection Time: 04/07/23  5:40 AM   Specimen: BLOOD RIGHT HAND  Result Value Ref Range Status   Specimen Description   Final    BLOOD RIGHT HAND Performed at [facility],  [address], [place], [name] [address]    Special Requests   Final    BOTTLES DRAWN AEROBIC ONLY Blood Culture adequate volume Performed at [facility], [address] [place], [name] [address]    Culture   Final    NO GROWTH 5 DAYS Performed at [facility], [address], [place], [name] [address]    Report Status 04/12/2023 FINAL  Final  Surgical pcr screen     Status: None   Collection Time: 04/08/23  2:10 AM   Specimen: Nasal Mucosa; Nasal Swab  Result Value Ref Range Status   MRSA, PCR NEGATIVE NEGATIVE Final   Staphylococcus aureus NEGATIVE NEGATIVE Final    Comment: (NOTE) The Xpert SA Assay (FDA approved for NASAL specimens in patients 67 years of age and older), is one component of a comprehensive surveillance program. It is not intended to diagnose infection nor to guide or monitor treatment. Performed at [facility], [address], [place], [name] [address]   SARS Coronavirus 2 by RT PCR (hospital order, performed in [facility] hospital lab) *cepheid single result test* Anterior Nasal Swab     Status: Abnormal   Collection Time: 04/30/23  1:10 PM   Specimen: Anterior Nasal Swab  Result Value Ref Range Status   SARS Coronavirus 2 by RT PCR POSITIVE (A) NEGATIVE Final    Comment: Performed at [facility], [address], [place], [name] [address]    Scheduled Meds:  (feeding supplement) PROSource Plus  30 mL Oral BID BM   docusate sodium  100 mg Oral BID   enoxaparin (LOVENOX) injection  70 mg Subcutaneous Daily   feeding supplement  237 mL Oral BID BM   folic acid  1 mg Oral Daily   Gerhardt's butt cream   Topical BID   insulin aspart  0-9 Units Subcutaneous TID WC   metoprolol tartrate  50 mg Oral BID   multivitamin with minerals  1 tablet Oral Daily   pantoprazole  40 mg Oral BID   saccharomyces boulardii  250 mg Oral BID   sodium chloride flush  10 mL Intravenous Q12H   sodium chloride flush  3 mL Intravenous Q12H    Continuous Infusions:  Principal Problem:   Sepsis (HCC) Active Problems:   Acute osteomyelitis of right foot (HCC)   Acute on chronic anemia   Metabolic acidosis   CAP (community acquired pneumonia)   Diarrhea   Subacute osteomyelitis of right foot (HCC)   Subacute osteomyelitis, right ankle and foot (HCC)   Essential hypertension   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

## 2023-05-07 NOTE — TOC Progression Note (Signed)
Transition of Care (TOC) - Progression Note    Patient Details  Name: Alan Tapia. MRN: [id number] Date of Birth: Oct 05, 1975  Transition of Care [facility]) CM/SW Contact  Carley Hammed, LCSW Phone Number: 05/07/2023, 10:48 AM  Clinical Narrative:     CSW spoke with pt via phone to discuss disposition and to discuss concerns brought up by pt's spouse as to pt's care. Pt states he has no concerns as to his care, and does not want a transfer to a different unit. Pt tells CSW a tangential story about spouse buying pineapples and her yelling at him. Pt and CSW discuss current barriers to returning home including pt reporting intermittent support from staff. CSW discussed barriers to SNF, most notably waiting for disability to be approved. Pt notes his preference at this time would be to go to SNF at [place] vs. Home due to ongoing social issues. CSW reminded pt that facility will likely be further away due to insurance issues and pt states that is fine. CSW attempted to follow up with Hale Bogus from DSS, VM left. TOC will continue to follow.    Expected Discharge Plan: Skilled Nursing Facility Barriers to Discharge: Insurance Authorization, Continued Medical Work up, SNF Pending bed offer  Expected Discharge Plan and Services In-house Referral: Clinical Social Work     Living arrangements for the past 2 months: Single Family Home Expected Discharge Date: 04/11/23                                     Social Determinants of Health (SDOH) Interventions SDOH Screenings   Food Insecurity: No Food Insecurity (04/07/2023)  Housing: Unknown (04/23/2023)  Recent Concern: Housing - Medium Risk (04/07/2023)  Transportation Needs: No Transportation Needs (04/07/2023)  Recent Concern: Transportation Needs - Unmet Transportation Needs (02/17/2023)   Received from [facility]  Utilities: At Risk (04/07/2023)  Alcohol Screen: Low Risk  (08/20/2022)  Financial Resource Strain: Patient Unable To  Answer (08/20/2022)  Tobacco Use: Low Risk  (04/08/2023)    Readmission Risk Interventions    04/07/2023    2:34 PM  Readmission Risk Prevention Plan  Transportation Screening Complete  PCP or Specialist Appt within 3-5 Days Complete  HRI or Home Care Consult Complete  Social Work Consult for Recovery Care Planning/Counseling Complete  Palliative Care Screening Not Applicable  Medication Review Ocean[employer]) Complete

## 2023-05-07 NOTE — Progress Notes (Signed)
Nutrition Follow-up  DOCUMENTATION CODES:   Non-severe (moderate) malnutrition in context of chronic illness  INTERVENTION:   - Continue low sodium regular diet with double proteins - Mighty Shake TID with meals, each supplement provides 330 kcals and 9 grams of protein - Ensure Enlive po BID, each supplement provides 350 kcal and 20 grams of protein. - Continue Prosource Plus BID and snacks daily  - Continue MVI with minerals and folic acid daily   NUTRITION DIAGNOSIS:   Moderate Malnutrition related to chronic illness as evidenced by energy intake < or equal to 75% for > or equal to 1 month, moderate fat depletion, moderate muscle depletion.  - Still applicable   GOAL:   Patient will meet greater than or equal to 90% of their needs  - Ongoing   MONITOR:   PO intake, Supplement acceptance, Weight trends  REASON FOR ASSESSMENT:   Consult Assessment of nutrition requirement/status, Calorie Count  ASSESSMENT:   47 y.o. M presented  from home with N/V/D x 2 weeks and right foot wound, admitted with sepsis. PMH; type 2 diabetes, CHF, BKA of left lower extremity and ongoing diabetic foot infection, s/p RBKA.  4/5 - LBKA 11/27- RBKA  RD working remotely, no response when calling pt's room. History obtained through EHR. Pt's hospital course complicated by COVID diagnosis on 12/19 and difficult dispo.   Per meals documented, pt's PO intake fluctuates from 50-100% of meals consumed. Of note, pt is receiving double proteins with meals. Pt has been refusing some of his meds including prosource plus, MVI, folic acid, and some of his Ensures. Vitamin C discontinued due to pt's preference.   Admit weight: 144.8 kg  Current weight: 138 kg    Average Meal Intake: 12/23-12/25: 68% intake x 8 recorded meals  Nutritionally Relevant Medications: Scheduled Meds:  (feeding supplement) PROSource Plus  30 mL Oral BID BM   docusate sodium  100 mg Oral BID   feeding supplement  237 mL  Oral BID BM   folic acid  1 mg Oral Daily   insulin aspart  0-9 Units Subcutaneous TID WC   metoprolol tartrate  50 mg Oral BID   multivitamin with minerals  1 tablet Oral Daily   Labs Reviewed: Calcium 7.6  CBG ranges from 110-131 mg/dL over the last 24 hours HgbA1c 04/07/2023: 6.8%   Diet Order:   Diet Order             Diet regular Room service appropriate? Yes; Fluid consistency: Thin  Diet effective now           Diet - low sodium heart healthy                   EDUCATION NEEDS:   Education needs have been addressed  Skin:  Skin Assessment: Reviewed RN Assessment Skin Integrity Issues:: Stage II, Incisions, Wound VAC Stage II: [place] Wound Vac: R knee Incisions: R knee  Last BM:  12/25 - type 6, 3 x 24 hours  Height:   Ht Readings from Last 1 Encounters:  04/08/23 6\' 4"  (1.93 m)    Weight:   Wt Readings from Last 1 Encounters:  05/07/23 (!) 138 kg    Ideal Body Weight:  79.16 kg  BMI:  Body mass index is 37.03 kg/m.  Estimated Nutritional Needs:   Kcal:  2450- 2800 kcal/d  Protein:  140-160 gm  Fluid:  >2.2L   Elliot Dally, RD Registered Dietitian  See Amion for more information

## 2023-05-08 DIAGNOSIS — M86171 Other acute osteomyelitis, right ankle and foot: Secondary | ICD-10-CM

## 2023-05-08 LAB — BASIC METABOLIC PANEL
Anion gap: 5 (ref 5–15)
BUN: 8 mg/dL (ref 6–20)
CO2: 27 mmol/L (ref 22–32)
Calcium: 7.8 mg/dL — ABNORMAL LOW (ref 8.9–10.3)
Chloride: 107 mmol/L (ref 98–111)
Creatinine, Ser: 1.04 mg/dL (ref 0.61–1.24)
GFR, Estimated: >60 mL/min (ref >60)
Glucose, Bld: 99 mg/dL (ref 70–99)
Potassium: 3.8 mmol/L (ref 3.5–5.1)
Sodium: 139 mmol/L (ref 135–145)

## 2023-05-08 LAB — CBC WITH DIFFERENTIAL/PLATELET
Abs Immature Granulocytes: 0.04 10*3/uL (ref 0.00–0.07)
Basophils Absolute: 0.1 10*3/uL (ref 0.0–0.1)
Basophils Relative: 1 %
Eosinophils Absolute: 0.2 10*3/uL (ref 0.0–0.5)
Eosinophils Relative: 2 %
HCT: 30.7 % — ABNORMAL LOW (ref 39.0–52.0)
Hemoglobin: 9 g/dL — ABNORMAL LOW (ref 13.0–17.0)
Immature Granulocytes: 1 %
Lymphocytes Relative: 21 %
Lymphs Abs: 1.9 10*3/uL (ref 0.7–4.0)
MCH: 25.4 pg — ABNORMAL LOW (ref 26.0–34.0)
MCHC: 29.3 g/dL — ABNORMAL LOW (ref 30.0–36.0)
MCV: 86.7 fL (ref 80.0–100.0)
Monocytes Absolute: 0.6 10*3/uL (ref 0.1–1.0)
Monocytes Relative: 7 %
Neutro Abs: 6.1 10*3/uL (ref 1.7–7.7)
Neutrophils Relative %: 68 %
Platelets: 494 10*3/uL — ABNORMAL HIGH (ref 150–400)
RBC: 3.54 MIL/uL — ABNORMAL LOW (ref 4.22–5.81)
RDW: 18.9 % — ABNORMAL HIGH (ref 11.5–15.5)
WBC: 8.9 10*3/uL (ref 4.0–10.5)
nRBC: 0 % (ref 0.0–0.2)

## 2023-05-08 LAB — GLUCOSE, CAPILLARY
Glucose-Capillary: 81 mg/dL (ref 70–99)
Glucose-Capillary: 117 mg/dL — ABNORMAL HIGH (ref 70–99)
Glucose-Capillary: 119 mg/dL — ABNORMAL HIGH (ref 70–99)
Glucose-Capillary: 117 mg/dL — ABNORMAL HIGH (ref 70–99)

## 2023-05-08 NOTE — Plan of Care (Signed)
Patient reporting decreased shortness of breath. Attempting to ween from oxygen and encouraging IS.

## 2023-05-08 NOTE — Progress Notes (Signed)
Physical Therapy Treatment Patient Details Name: Alan Tapia. MRN: [id number] DOB: 11-15-1975 Today's Date: 05/08/2023   History of Present Illness The pt is a 47 yo male presenting 11/25 with nausea, vomiting, and fever as well as R foot wound. 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    PT Comments  Pt received sitting EOB working on his dinner, PTA offered to come back later so he can finish eating but pt eager to participate in PT session and agreeable to work on transfer training, emphasis on anterior/posterior scooting and lateral scooting in long sitting in bed and to wheelchair. Pt performs lateral leans to prop on either elbow and rolling in supine with up to minA, and performs lateral and anterior/posterior scooting with maxi-slide pad and up to +2 maxA. NT present during session to assist with pt hygiene and also providing +2 lift assist for pt and staff safety. Once up in wheelchair, pt reports surface is too firm and requesting return to supine, however plan to work on wheelchair mobility next session once wider, more cushioned wheelchair is available. Pt will continue to benefit from skilled rehab in a post acute setting to maximize functional gains before returning home.     If plan is discharge home, recommend the following: Two people to help with walking and/or transfers;Assist for transportation;Assistance with cooking/housework;Help with stairs or ramp for entrance   Can travel by private vehicle     No  Equipment Recommendations  Hoyer lift;Wheelchair cushion (measurements PT);Wheelchair (measurements PT) (recommend maxi-slide pad or similar)    Recommendations for Other Services       Precautions / Restrictions Precautions Precautions: Fall Precaution Comments: RLE limb  protector Restrictions Weight Bearing Restrictions Per Provider Order: Yes RLE Weight Bearing Per Provider Order: Non weight bearing Other Position/Activity Restrictions: bilateral BKA     Mobility  Bed Mobility Overal bed mobility: Needs Assistance Bed Mobility: Rolling, Sit to Supine, Supine to Sit, Sidelying to Sit Rolling: Min assist, Used rails Sidelying to sit: Min assist, Mod assist, +2 for physical assistance   Sit to supine: Min assist, +2 for safety/equipment, Used rails   General bed mobility comments: Patient seated on EOB upon entry and went to supine at end of session    Transfers Overall transfer level: Needs assistance Equipment used: None (maxi-slide pad and bed pad above maxi-slide pad) Transfers: Bed to chair/wheelchair/BSC         Anterior-Posterior transfers: Max assist, +2 physical assistance  Lateral/Scoot Transfers: Max assist, Mod assist, +2 physical assistance General transfer comment: Pt received sitting EOB. Maxi-slide pad placed via pt lateral leans to each side to get under his hips with pt expressing improved comfort with A/P and lateral scoots with maxi-slide pad under him. Pt needing mod to maxA +2 to laterally scoot in long sitting in bed and while rotating hips from EOB to long sitting in middle of bed. Pt needs increased assist, up to +2 maxA for posterior scoot from bed to wheelchair, with maxA +2 pt expresses increased discomfort so was safer with +2 person assist to help elevate his hips better with maxi-slide. Pt scooted back from bed into wheelchair, but states wheelchair surface is too hard, and therefore returned back into bed via anterior scoot with +2 assist and maxi-slide pad. Pt then rolled for removal of maxi-slide  pad and back in supine with HOB at ~30 degrees, pt reports any higher would be too painful on his hips/bottom.    Ambulation/Gait                   Education officer, community mobility:  (pt defers due to wheelchair surface feeling too firm to remain in chair >5 mins. Pt agreeable to attempt next session if better cushion can be obtained for chair.)   Tilt Bed    Modified Rankin (Stroke Patients Only)       Balance Overall balance assessment: Needs assistance Sitting-balance support: Bilateral upper extremity supported, Feet unsupported, No upper extremity supported Sitting balance-Leahy Scale: Good Sitting balance - Comments: Pt seated on EOB, eating his lunch upon PTA and NT entry                                    Cognition Arousal: Alert Behavior During Therapy: WFL for tasks assessed/performed Overall Cognitive Status: Within Functional Limits for tasks assessed                                 General Comments: patient prefers to direct session/mobility        Exercises General Exercises - Upper Extremity Chair Push Up: AROM, Both, Seated (a couple reps while scooting back into wheelchair more, pt has difficulty fully extending elbows) Other Exercises Other Exercises: HEP: Visit    [email or web address]    Access Code: [email or web address] pt reports compliance with HEP handout    General Comments General comments (skin integrity, edema, etc.): NT assisted him with hygiene assist and bed linens changed during session with pt rolling in supine to L/R sides for placement of new fitted sheet and bed pad.      Pertinent Vitals/Pain Pain Assessment Pain Assessment: Faces Faces Pain Scale: Hurts little more Pain Location: anus/buttocks, increases with activity but pt reports with maxi-slide pad today it was slightly less painful Pain Descriptors / Indicators: Discomfort, Grimacing, Guarding    Home Living   Prior Function    PT Goals (current goals can now be found in the care plan section) Acute Rehab PT Goals Patient Stated Goal: get bil prosthetics PT Goal Formulation: With patient Time  For Goal Achievement: 04/24/23 Progress towards PT goals: Progressing toward goals    Frequency    Min 1X/week      PT Plan         AM-PAC PT "6 Clicks" Mobility   Outcome Measure  Help needed turning from your back to your side while in a flat bed without using bedrails?: A Little Help needed moving from lying on your back to sitting on the side of a flat bed without using bedrails?: A Lot Help needed moving to and from a bed to a chair (including a wheelchair)?: A Lot Help needed standing up from a chair using your arms (e.g., wheelchair or bedside chair)?: Total Help needed to walk in hospital room?: Total Help needed climbing 3-5 steps with a railing? : Total 6 Click Score: 10    End of Session Equipment Utilized During Treatment: Gait belt;Other (comment) (maxi-slide pad) Activity Tolerance: Patient tolerated treatment well;Patient limited by pain Patient left: in bed;with call bell/phone within reach;with bed alarm set Nurse Communication:  Mobility status;Need for lift equipment;Other (comment) (pt requesting butt cream and container looked low - RN notified, per RN another container is in his room.) PT Visit Diagnosis: Other abnormalities of gait and mobility (R26.89);Muscle weakness (generalized) (M62.81);Pain Pain - Right/Left: Right Pain - part of body: Leg     Time: 6962-9528 PT Time Calculation (min) (ACUTE ONLY): 40 min  Charges:    $Therapeutic Activity: 38-52 mins PT General Charges $$ ACUTE PT VISIT: 1 Visit                     Darchelle Nunes P., PTA Acute Rehabilitation Services Secure Chat Preferred 9a-5:30pm Office: [phone]    Dorathy Kinsman [facility] 05/08/2023, 4:20 PM

## 2023-05-08 NOTE — Progress Notes (Signed)
PROGRESS NOTE  Alan Tapia. OZH:[id number] DOB: [birth year] DOA: 04/06/2023 PCP: Patient, No Pcp Per  Brief History   Alan Tapia. is 46 y.o. male with extensive hospital course.  Patient initially presented septic due to nonhealing right foot ulcer.  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 Lajoyce Corners.  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  His postoperative course has been complicated by COVID-19, and social issues making dispo complex.   As I entered the room this morning the patient appeared to be in some respiratory distress. I was asking him questions, and he did not speak to me, leaving me to wonder if he was so short of breath that he was unable to speak to me. Only when I suggested a CXR did the patient speak telling me that I should be patient, and that he did not want a CXR. He had already had CXR's and he didn't need another one. He fel that it would have been most appropriate for me to stand back and not involve myself in a patient in obvious respiratory distress. He was very unhappy and went on to voice frustrations with everything from the diet at the hospital to his being in a room with ants when he first arrived.  I apologized for his unhappy experience.   A & P  Principal Problem:   Sepsis (HCC) Active Problems:   Acute osteomyelitis of right foot (HCC)   Acute on chronic anemia   Metabolic acidosis   CAP (community acquired pneumonia)   Diarrhea   Subacute osteomyelitis of right foot (HCC)   Subacute osteomyelitis, right ankle and foot (HCC)   Essential hypertension   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)   Insulin dependent type 2 diabetes mellitus (HCC)   CKD (chronic kidney disease), stage II   Hx of left BKA (HCC)   PVD (peripheral vascular  disease) (HCC)   Cutaneous abscess of right foot   Malnutrition of moderate degree   COVID-19 virus infection   Pressure injury of skin       Sepsis secondary to nonhealing right foot ulcer - Sepsis resolved - Status post right BKA 11/27 - Blood cultures negative - Dr. Lajoyce Corners seen on 12/18, wound VAC removed.  Continue with daily dry dressing changes with stump shrinker on top   Dyspnea COVID-19 infection (12/19) - Chest x-ray with some volume overload, status post 3-day course of Lasix --The patient refused any further CXR on 05/08/2023 saying that I just wanted to charge him.  --given the patient's volume overload present on CXR from 04/26/2023 (his most recent) I will restart lasix. - Pt refused any interventions into his dyspneic episode this morning.  - Status post course of Paxlovid - Continue incentive spirometry   Microcytic anemia - FOBT negative - Status post PRBC transfusions during this hospitalization - Multifactorial from iron deficiency anemia and anemia of chronic disease   Hypertension - Continue with metoprolol, titrate as needed   Type 2 diabetes with hyperglycemia - Hemoglobin A1c 6.8% - Continue with sliding scale insulin, titrate up as tolerated   AKI - Resolved - Renal ultrasound within normal limits   Vomiting - Suspected secondary to gastroparesis, resolved now.  Discontinue Reglan.   Complex disposition planning - Case management and social work working on [employer].  DSS also  involved.  Reportedly patient's home is not safe for current needs.  He will need accessible housing.  Home health is also been a barrier. - TOC consulted and working on it   Pressure Injury 04/13/23 Perineum Bilateral Stage 2 -  Partial thickness loss of dermis presenting as a shallow open injury with a red, pink wound bed without slough. perineum/scrotum masd on admission (Active)  04/13/23 1100  Location: Perineum  Location Orientation: Bilateral  Staging: Stage 2 -   Partial thickness loss of dermis presenting as a shallow open injury with a red, pink wound bed without slough.  Wound Description (Comments): perineum/scrotum masd on admission  Present on Admission:           DVT prophylaxis: lovenox   Code Status: Full Code Family Communication: Wife at bedside for discussin Disposition:  Status is: Inpatient Remains inpatient appropriate because: dispo pending     Consultants:    Treatment Team:  Consulting Physician: Nadara Mustard, MD   Procedures:  R BKA   Fran Lowes, DO [facility] Direct contact: see [email or web address]  7PM-7AM contact night coverage as above 05/08/2023, 4:52 PM  LOS: 31 days   Interval History/Subjective  The patient is sitting up on the edge of his bed. He states that he is trying to get used to the new amputation. No new complaints.  Objective   Vitals:  Vitals:   05/08/23 0814 05/08/23 1542  BP: (!) 152/90 (!) 147/90  Pulse: 95 90  Resp: 16 16  Temp: 97.6 F (36.4 C) (!) 97.3 F (36.3 C)  SpO2: 100% 100%    Exam:  Constitutional:  The patient is awake, alert, and oriented x 3. He is in acute respiratory distress. Respiratory:  Positive for tachypnea and accessory muscle use to aid breathing. No wheezes, rales, or rhonchi Positive for diminished sounds at bases bilaterally. Cardiovascular:  Regular rate and rhythm No murmurs, ectopy, or gallups. No lateral PMI. No thrills. Abdomen:  Abdomen is soft, non-tender, non-distended No hernias, masses, or organomegaly Normoactive bowel sounds.  Musculoskeletal:  No cyanosis, clubbing, or edema Bilateral BKA's. Skin:  No rashes, lesions, ulcers palpation of skin: no induration or nodules Neurologic:  CN 2-12 intact Sensation all 4 extremities intact Psychiatric:  Mental status Mood - depressed, oppositional Orientation to person, place, time  judgment and insight do not appear intact  I have personally reviewed the following:    Today's Data   Vitals  Lab Data  CBC    Component Value Date/Time   WBC 8.9 05/08/2023 0529   RBC 3.54 (L) 05/08/2023 0529   HGB 9.0 (L) 05/08/2023 0529   HCT 30.7 (L) 05/08/2023 0529   PLT 494 (H) 05/08/2023 0529   MCV 86.7 05/08/2023 0529   MCH 25.4 (L) 05/08/2023 0529   MCHC 29.3 (L) 05/08/2023 0529   RDW 18.9 (H) 05/08/2023 0529   LYMPHSABS 1.9 05/08/2023 0529   MONOABS 0.6 05/08/2023 0529   EOSABS 0.2 05/08/2023 0529   BASOSABS 0.1 05/08/2023 0529      Latest Ref Rng & Units 05/08/2023    5:29 AM 05/03/2023   10:09 AM 05/02/2023    6:02 AM  BMP  Glucose 70 - 99 mg/dL 99  308  73   BUN 6 - 20 mg/dL 8  9  9    Creatinine 0.61 - 1.24 mg/dL 6.57  8.46  9.62   Sodium 135 - 145 mmol/L 139  136  139   Potassium 3.5 - 5.1 mmol/L 3.8  3.6  4.2   Chloride 98 - 111 mmol/L 107  106  108   CO2 22 - 32 mmol/L 27  26  27    Calcium 8.9 - 10.3 mg/dL 7.8  7.6  7.5      Micro Data   Results for orders placed or performed during the hospital encounter of 04/06/23  Culture, blood (Routine x 2)     Status: None   Collection Time: 04/06/23 10:20 PM   Specimen: BLOOD  Result Value Ref Range Status   Specimen Description   Final    BLOOD LEFT ANTECUBITAL Performed at [facility], [address] [place], [name] [address]    Special Requests   Final    BOTTLES DRAWN AEROBIC AND ANAEROBIC Blood Culture adequate volume Performed at [facility], [address] [place], [name] [address]    Culture   Final    NO GROWTH 5 DAYS Performed at [facility], [address] [place], [name] [address]    Report Status 04/11/2023 FINAL  Final  Culture, blood (Routine x 2)     Status: None   Collection Time: 04/06/23 10:32 PM   Specimen: BLOOD  Result Value Ref Range Status   Specimen Description   Final    BLOOD BLOOD LEFT FOREARM Performed at [facility], [address] [place], [name] [address]    Special  Requests   Final    BOTTLES DRAWN AEROBIC AND ANAEROBIC Blood Culture results may not be optimal due to an inadequate volume of blood received in culture bottles Performed at [facility], [address] [place], [name] [address]    Culture   Final    NO GROWTH 5 DAYS Performed at [facility], [address], [place], [name] [address]    Report Status 04/11/2023 FINAL  Final  Respiratory (~20 pathogens) panel by PCR     Status: None   Collection Time: 04/07/23  1:04 AM   Specimen: Nasopharyngeal Swab; Respiratory  Result Value Ref Range Status   Adenovirus NOT DETECTED NOT DETECTED Final   Coronavirus 229E NOT DETECTED NOT DETECTED Final    Comment: (NOTE) The Coronavirus on the Respiratory Panel, DOES NOT test for the novel  Coronavirus (2019 nCoV)    Coronavirus HKU1 NOT DETECTED NOT DETECTED Final   Coronavirus NL63 NOT DETECTED NOT DETECTED Final   Coronavirus OC43 NOT DETECTED NOT DETECTED Final   Metapneumovirus NOT DETECTED NOT DETECTED Final   Rhinovirus / Enterovirus NOT DETECTED NOT DETECTED Final   Influenza A NOT DETECTED NOT DETECTED Final   Influenza B NOT DETECTED NOT DETECTED Final   Parainfluenza Virus 1 NOT DETECTED NOT DETECTED Final   Parainfluenza Virus 2 NOT DETECTED NOT DETECTED Final   Parainfluenza Virus 3 NOT DETECTED NOT DETECTED Final   Parainfluenza Virus 4 NOT DETECTED NOT DETECTED Final   Respiratory Syncytial Virus NOT DETECTED NOT DETECTED Final   Bordetella pertussis NOT DETECTED NOT DETECTED Final   Bordetella Parapertussis NOT DETECTED NOT DETECTED Final   Chlamydophila pneumoniae NOT DETECTED NOT DETECTED Final   Mycoplasma pneumoniae NOT DETECTED NOT DETECTED Final    Comment: Performed at [facility], [address], [place], [name] [address]  Culture, blood (routine x 2) Call MD if unable to obtain prior to antibiotics being given     Status: None   Collection Time: 04/07/23  5:40 AM   Specimen: BLOOD  RIGHT ARM  Result Value Ref Range Status   Specimen Description   Final    BLOOD RIGHT ARM Performed at [facility], [address] [place], [name] [address]    Special Requests   Final    BOTTLES DRAWN AEROBIC ONLY Blood Culture adequate volume Performed at [facility], [address], [place], [name] [address]    Culture   Final    NO GROWTH 5 DAYS Performed at [facility], [address], [place], [name] [address]    Report Status 04/12/2023 FINAL  Final  Culture, blood (routine x 2) Call MD if unable to obtain prior to antibiotics being given     Status: None   Collection Time: 04/07/23  5:40 AM   Specimen: BLOOD RIGHT HAND  Result Value Ref Range Status   Specimen Description   Final    BLOOD RIGHT HAND Performed at [facility], [address] [place], [name] [address]    Special Requests   Final    BOTTLES DRAWN AEROBIC ONLY Blood Culture adequate volume Performed at [facility], [address] [place], [name] [address]    Culture   Final    NO GROWTH 5 DAYS Performed at [facility], [address], [place], [name] [address]    Report Status 04/12/2023 FINAL  Final  Surgical pcr screen     Status: None   Collection Time: 04/08/23  2:10 AM   Specimen: Nasal Mucosa; Nasal Swab  Result Value Ref Range Status   MRSA, PCR NEGATIVE NEGATIVE Final   Staphylococcus aureus NEGATIVE NEGATIVE Final    Comment: (NOTE) The Xpert SA Assay (FDA approved for NASAL specimens in patients 37 years of age and older), is one component of a comprehensive surveillance program. It is not intended to diagnose infection nor to guide or monitor treatment. Performed at [facility], [address], [place], [name] [address]   SARS Coronavirus 2 by RT PCR (hospital order, performed in [facility] hospital lab) *cepheid single result test* Anterior Nasal Swab     Status: Abnormal   Collection Time: 04/30/23   1:10 PM   Specimen: Anterior Nasal Swab  Result Value Ref Range Status   SARS Coronavirus 2 by RT PCR POSITIVE (A) NEGATIVE Final    Comment: Performed at [facility], [address], [place], [name] [address]    Scheduled Meds:  (feeding supplement) PROSource Plus  30 mL Oral BID BM   docusate sodium  100 mg Oral BID   enoxaparin (LOVENOX) injection  70 mg Subcutaneous Daily   feeding supplement  237 mL Oral BID BM   folic acid  1 mg Oral Daily   Gerhardt's butt cream   Topical BID   insulin aspart  0-9 Units Subcutaneous TID WC   metoprolol tartrate  50 mg Oral BID   multivitamin with minerals  1 tablet Oral Daily   pantoprazole  40 mg Oral BID   saccharomyces boulardii  250 mg Oral BID   sodium chloride flush  10 mL Intravenous Q12H   sodium chloride flush  3 mL Intravenous Q12H   Continuous Infusions:  Principal Problem:   Sepsis (HCC) Active Problems:   Acute osteomyelitis of right foot (HCC)   Acute on chronic anemia   Metabolic acidosis   CAP (community acquired pneumonia)   Diarrhea   Subacute osteomyelitis of right foot (HCC)   Subacute osteomyelitis, right ankle and  foot (HCC)   Essential hypertension   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)   Insulin dependent type 2 diabetes mellitus (HCC)   CKD (chronic kidney disease), stage II   Hx of left BKA (HCC)   PVD (peripheral vascular disease) (HCC)   Cutaneous abscess of right foot   Malnutrition of moderate degree   COVID-19 virus infection   Pressure injury of skin   LOS: 31 days

## 2023-05-08 NOTE — Plan of Care (Signed)
  Problem: Clinical Measurements: Goal: Diagnostic test results will improve Outcome: Progressing   Problem: Respiratory: Goal: Ability to maintain adequate ventilation will improve Outcome: Progressing   Problem: Education: Goal: Knowledge of General Education information will improve Description: Including pain rating scale, medication(s)/side effects and non-pharmacologic comfort measures Outcome: Progressing   Problem: Clinical Measurements: Goal: Ability to maintain clinical measurements within normal limits will improve Outcome: Progressing Goal: Will remain free from infection Outcome: Progressing Goal: Cardiovascular complication will be avoided Outcome: Progressing   Problem: Activity: Goal: Risk for activity intolerance will decrease Outcome: Progressing

## 2023-05-09 DIAGNOSIS — N179 Acute kidney failure, unspecified: Secondary | ICD-10-CM | POA: Diagnosis not present

## 2023-05-09 DIAGNOSIS — R6520 Severe sepsis without septic shock: Secondary | ICD-10-CM | POA: Diagnosis not present

## 2023-05-09 DIAGNOSIS — A419 Sepsis, unspecified organism: Secondary | ICD-10-CM | POA: Diagnosis not present

## 2023-05-09 LAB — GLUCOSE, CAPILLARY
Glucose-Capillary: 85 mg/dL (ref 70–99)
Glucose-Capillary: 88 mg/dL (ref 70–99)
Glucose-Capillary: 135 mg/dL — ABNORMAL HIGH (ref 70–99)
Glucose-Capillary: 148 mg/dL — ABNORMAL HIGH (ref 70–99)

## 2023-05-09 NOTE — Plan of Care (Signed)
  Problem: Fluid Volume: Goal: Hemodynamic stability will improve Outcome: Progressing   Problem: Clinical Measurements: Goal: Diagnostic test results will improve Outcome: Progressing   Problem: Respiratory: Goal: Ability to maintain adequate ventilation will improve Outcome: Progressing   Problem: Education: Goal: Knowledge of General Education information will improve Description: Including pain rating scale, medication(s)/side effects and non-pharmacologic comfort measures Outcome: Progressing   Problem: Nutrition: Goal: Adequate nutrition will be maintained Outcome: Progressing   Problem: Elimination: Goal: Will not experience complications related to bowel motility Outcome: Progressing   Problem: Pain Management: Goal: General experience of comfort will improve Outcome: Progressing   Problem: Safety: Goal: Ability to remain free from injury will improve Outcome: Progressing   Problem: Skin Integrity: Goal: Risk for impaired skin integrity will decrease Outcome: Progressing   Problem: Clinical Measurements: Goal: Ability to maintain clinical measurements within normal limits will improve Outcome: Progressing

## 2023-05-09 NOTE — Progress Notes (Signed)
PROGRESS NOTE  Alan Tapia. WUJ:[id number] DOB: 1976/01/17 DOA: 04/06/2023 PCP: Patient, No Pcp Per  Brief History   Alan Tapia. is 47 y.o. male with extensive hospital course.  Patient initially presented septic due to nonhealing right foot ulcer.  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 Lajoyce Corners.  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  His postoperative course has been complicated by COVID-19, and social issues making dispo complex.   The patient is resting comfortably. No new complaints.  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 Lajoyce Corners seen on 12/18, wound VAC removed.  Continue with daily dry dressing changes with stump shrinker on top   Dyspnea COVID-19 infection (12/19) - Chest x-ray with some volume overload, status post 3-day course of Lasix --The patient refused any further CXR on 05/08/2023 saying that I just wanted to charge him.  --given the patient's volume overload present on CXR from 04/26/2023 (his most recent) I will  restart lasix. - Pt refused any interventions into his dyspneic episode this morning.  - Status post course of Paxlovid - Continue incentive spirometry --The patient is saturating 100% on 2 liters by nasal cannula   Microcytic anemia - FOBT negative - Status post PRBC transfusions during this hospitalization - Multifactorial from iron deficiency anemia and anemia of chronic disease   Hypertension - Continue with metoprolol, titrate as needed   Type 2 diabetes with hyperglycemia - Hemoglobin A1c 6.8% - Continue with sliding scale insulin, titrate up as tolerated   AKI - Resolved - Renal ultrasound within normal limits   Vomiting - Suspected secondary to gastroparesis, resolved now.  Discontinue Reglan.   Complex disposition planning - Case management and social work working on [employer].  DSS also involved.  Reportedly patient's home is not safe for current needs.  He will need accessible housing.  Home health is also been a barrier. - TOC consulted and working on it   Pressure Injury 04/13/23 Perineum Bilateral Stage 2 -  Partial thickness loss of dermis presenting as a shallow open injury with a red, pink wound bed without slough. perineum/scrotum masd on admission (Active)  04/13/23 1100  Location: Perineum  Location Orientation: Bilateral  Staging: Stage 2 -  Partial thickness loss of dermis presenting as a shallow open injury with a red, pink wound bed without slough.  Wound Description (Comments): perineum/scrotum masd on admission  Present on Admission:       DVT prophylaxis: lovenox   Code Status: Full Code  Family Communication: Wife at bedside for discussin Disposition:  Status is: Inpatient Remains inpatient appropriate because: dispo pending     Consultants:    Treatment Team:  Consulting Physician: Nadara Mustard, MD   Procedures:  R BKA   Fran Lowes, DO [facility] Direct contact: see [email or web address]  7PM-7AM contact night coverage as  above 05/09/2023, 4:07 PM  LOS: 32 days   Interval History/Subjective  The patient is resting comfortably. No new complaints.  Objective   Vitals:  Vitals:   05/09/23 0255 05/09/23 0811  BP: (!) 152/87 (!) 144/97  Pulse: 86 98  Resp: 20 16  Temp: 98.4 F (36.9 C) 98 F (36.7 C)  SpO2: 100% 100%    Exam:  Constitutional:  The patient is awake, alert, and oriented x 3. He is in acute respiratory distress. Respiratory:  Positive for tachypnea and accessory muscle use to aid breathing. No wheezes, rales, or rhonchi Positive for diminished sounds at bases bilaterally. Cardiovascular:  Regular rate and rhythm No murmurs, ectopy, or gallups. No lateral PMI. No thrills. Abdomen:  Abdomen is soft, non-tender, non-distended No hernias, masses, or organomegaly Normoactive bowel sounds.  Musculoskeletal:  No cyanosis, clubbing, or edema Bilateral BKA's. Skin:  No rashes, lesions, ulcers palpation of skin: no induration or nodules Neurologic:  CN 2-12 intact Sensation all 4 extremities intact Psychiatric:  Mental status Mood - depressed, oppositional Orientation to person, place, time  judgment and insight do not appear intact  I have personally reviewed the following:   Today's Data   Vitals  Lab Data  CBC    Component Value Date/Time   WBC 8.9 05/08/2023 0529   RBC 3.54 (L) 05/08/2023 0529   HGB 9.0 (L) 05/08/2023 0529   HCT 30.7 (L) 05/08/2023 0529   PLT 494 (H) 05/08/2023 0529   MCV 86.7 05/08/2023 0529   MCH 25.4 (L) 05/08/2023 0529   MCHC 29.3 (L) 05/08/2023 0529   RDW 18.9 (H) 05/08/2023 0529   LYMPHSABS 1.9 05/08/2023 0529   MONOABS 0.6 05/08/2023 0529   EOSABS 0.2 05/08/2023 0529   BASOSABS 0.1 05/08/2023 0529      Latest Ref Rng & Units 05/08/2023    5:29 AM 05/03/2023   10:09 AM 05/02/2023    6:02 AM  BMP  Glucose 70 - 99 mg/dL 99  161  73   BUN 6 - 20 mg/dL 8  9  9    Creatinine 0.61 - 1.24 mg/dL 0.96  0.45  4.09   Sodium 135 - 145  mmol/L 139  136  139   Potassium 3.5 - 5.1 mmol/L 3.8  3.6  4.2   Chloride 98 - 111 mmol/L 107  106  108   CO2 22 - 32 mmol/L 27  26  27    Calcium 8.9 - 10.3 mg/dL 7.8  7.6  7.5      Micro Data   Results for orders placed or performed during the hospital encounter of 04/06/23  Culture, blood (Routine x 2)     Status: None   Collection Time: 04/06/23 10:20 PM   Specimen: BLOOD  Result Value Ref Range Status   Specimen Description   Final    BLOOD LEFT ANTECUBITAL Performed at [facility], [address], [place], [name] [address]    Special Requests   Final    BOTTLES DRAWN AEROBIC AND ANAEROBIC Blood Culture adequate volume Performed at [facility], [address], [place], [name] [address]    Culture  Final    NO GROWTH 5 DAYS Performed at [facility], [address] [place], [name] [address]    Report Status 04/11/2023 FINAL  Final  Culture, blood (Routine x 2)     Status: None   Collection Time: 04/06/23 10:32 PM   Specimen: BLOOD  Result Value Ref Range Status   Specimen Description   Final    BLOOD BLOOD LEFT FOREARM Performed at [facility], [address] [place], [name] [address]    Special Requests   Final    BOTTLES DRAWN AEROBIC AND ANAEROBIC Blood Culture results may not be optimal due to an inadequate volume of blood received in culture bottles Performed at [facility], [address], [place], [name] [address]    Culture   Final    NO GROWTH 5 DAYS Performed at [facility], [address] [place], [name] [address]    Report Status 04/11/2023 FINAL  Final  Respiratory (~20 pathogens) panel by PCR     Status: None   Collection Time: 04/07/23  1:04 AM   Specimen: Nasopharyngeal Swab; Respiratory  Result Value Ref Range Status   Adenovirus NOT DETECTED NOT DETECTED Final   Coronavirus 229E NOT DETECTED NOT DETECTED Final    Comment: (NOTE) The  Coronavirus on the Respiratory Panel, DOES NOT test for the novel  Coronavirus (2019 nCoV)    Coronavirus HKU1 NOT DETECTED NOT DETECTED Final   Coronavirus NL63 NOT DETECTED NOT DETECTED Final   Coronavirus OC43 NOT DETECTED NOT DETECTED Final   Metapneumovirus NOT DETECTED NOT DETECTED Final   Rhinovirus / Enterovirus NOT DETECTED NOT DETECTED Final   Influenza A NOT DETECTED NOT DETECTED Final   Influenza B NOT DETECTED NOT DETECTED Final   Parainfluenza Virus 1 NOT DETECTED NOT DETECTED Final   Parainfluenza Virus 2 NOT DETECTED NOT DETECTED Final   Parainfluenza Virus 3 NOT DETECTED NOT DETECTED Final   Parainfluenza Virus 4 NOT DETECTED NOT DETECTED Final   Respiratory Syncytial Virus NOT DETECTED NOT DETECTED Final   Bordetella pertussis NOT DETECTED NOT DETECTED Final   Bordetella Parapertussis NOT DETECTED NOT DETECTED Final   Chlamydophila pneumoniae NOT DETECTED NOT DETECTED Final   Mycoplasma pneumoniae NOT DETECTED NOT DETECTED Final    Comment: Performed at [facility], [address], [place], [name] [address]  Culture, blood (routine x 2) Call MD if unable to obtain prior to antibiotics being given     Status: None   Collection Time: 04/07/23  5:40 AM   Specimen: BLOOD RIGHT ARM  Result Value Ref Range Status   Specimen Description   Final    BLOOD RIGHT ARM Performed at [facility], [address], [place], [name] [address]    Special Requests   Final    BOTTLES DRAWN AEROBIC ONLY Blood Culture adequate volume Performed at [facility], [address], [place], [name] [address]    Culture   Final    NO GROWTH 5 DAYS Performed at [facility], [address], [place], [name] [address]    Report Status 04/12/2023 FINAL  Final  Culture, blood (routine x 2) Call MD if unable to obtain prior to antibiotics being given     Status: None   Collection Time: 04/07/23  5:40 AM   Specimen: BLOOD RIGHT HAND  Result Value Ref Range  Status   Specimen Description   Final    BLOOD  RIGHT HAND Performed at [facility], [address], [place], [name] [address]    Special Requests   Final    BOTTLES DRAWN AEROBIC ONLY Blood Culture adequate volume Performed at [facility], [address], [place], [name] [address]    Culture   Final    NO GROWTH 5 DAYS Performed at [facility], [address], [place], [name] [address]    Report Status 04/12/2023 FINAL  Final  Surgical pcr screen     Status: None   Collection Time: 04/08/23  2:10 AM   Specimen: Nasal Mucosa; Nasal Swab  Result Value Ref Range Status   MRSA, PCR NEGATIVE NEGATIVE Final   Staphylococcus aureus NEGATIVE NEGATIVE Final    Comment: (NOTE) The Xpert SA Assay (FDA approved for NASAL specimens in patients 82 years of age and older), is one component of a comprehensive surveillance program. It is not intended to diagnose infection nor to guide or monitor treatment. Performed at [facility], [address] [place], [name] [address]   SARS Coronavirus 2 by RT PCR (hospital order, performed in [facility] hospital lab) *cepheid single result test* Anterior Nasal Swab     Status: Abnormal   Collection Time: 04/30/23  1:10 PM   Specimen: Anterior Nasal Swab  Result Value Ref Range Status   SARS Coronavirus 2 by RT PCR POSITIVE (A) NEGATIVE Final    Comment: Performed at [facility], [address], [place], [name] [address]    Scheduled Meds:  (feeding supplement) PROSource Plus  30 mL Oral BID BM   docusate sodium  100 mg Oral BID   enoxaparin (LOVENOX) injection  70 mg Subcutaneous Daily   feeding supplement  237 mL Oral BID BM   folic acid  1 mg Oral Daily   Gerhardt's butt cream   Topical BID   insulin aspart  0-9 Units Subcutaneous TID WC   metoprolol tartrate  50 mg Oral BID   multivitamin with minerals  1 tablet Oral Daily   pantoprazole  40 mg Oral BID   saccharomyces boulardii  250 mg  Oral BID   sodium chloride flush  10 mL Intravenous Q12H   sodium chloride flush  3 mL Intravenous Q12H   Continuous Infusions:  Principal Problem:   Sepsis (HCC) Active Problems:   Acute osteomyelitis of right foot (HCC)   Acute on chronic anemia   Metabolic acidosis   CAP (community acquired pneumonia)   Diarrhea   Subacute osteomyelitis of right foot (HCC)   Subacute osteomyelitis, right ankle and foot (HCC)   Essential hypertension   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

## 2023-05-10 ENCOUNTER — Inpatient Hospital Stay (HOSPITAL_COMMUNITY): Payer: Medicaid Other

## 2023-05-10 DIAGNOSIS — A419 Sepsis, unspecified organism: Secondary | ICD-10-CM | POA: Diagnosis not present

## 2023-05-10 DIAGNOSIS — N179 Acute kidney failure, unspecified: Secondary | ICD-10-CM | POA: Diagnosis not present

## 2023-05-10 DIAGNOSIS — R6520 Severe sepsis without septic shock: Secondary | ICD-10-CM | POA: Diagnosis not present

## 2023-05-10 LAB — CBC WITH DIFFERENTIAL/PLATELET
Abs Immature Granulocytes: 0.03 10*3/uL (ref 0.00–0.07)
Basophils Absolute: 0.1 10*3/uL (ref 0.0–0.1)
Basophils Relative: 1 %
Eosinophils Absolute: 0.1 10*3/uL (ref 0.0–0.5)
Eosinophils Relative: 2 %
HCT: 26.8 % — ABNORMAL LOW (ref 39.0–52.0)
Hemoglobin: 7.9 g/dL — ABNORMAL LOW (ref 13.0–17.0)
Immature Granulocytes: 1 %
Lymphocytes Relative: 31 %
Lymphs Abs: 1.9 10*3/uL (ref 0.7–4.0)
MCH: 25.5 pg — ABNORMAL LOW (ref 26.0–34.0)
MCHC: 29.5 g/dL — ABNORMAL LOW (ref 30.0–36.0)
MCV: 86.5 fL (ref 80.0–100.0)
Monocytes Absolute: 0.5 10*3/uL (ref 0.1–1.0)
Monocytes Relative: 8 %
Neutro Abs: 3.7 10*3/uL (ref 1.7–7.7)
Neutrophils Relative %: 57 %
Platelets: 284 10*3/uL (ref 150–400)
RBC: 3.1 MIL/uL — ABNORMAL LOW (ref 4.22–5.81)
RDW: 18.6 % — ABNORMAL HIGH (ref 11.5–15.5)
WBC: 6.3 10*3/uL (ref 4.0–10.5)
nRBC: 0 % (ref 0.0–0.2)

## 2023-05-10 LAB — GLUCOSE, CAPILLARY
Glucose-Capillary: 97 mg/dL (ref 70–99)
Glucose-Capillary: 101 mg/dL — ABNORMAL HIGH (ref 70–99)
Glucose-Capillary: 129 mg/dL — ABNORMAL HIGH (ref 70–99)
Glucose-Capillary: 88 mg/dL (ref 70–99)

## 2023-05-10 LAB — BASIC METABOLIC PANEL
Anion gap: 9 (ref 5–15)
BUN: 9 mg/dL (ref 6–20)
CO2: 20 mmol/L — ABNORMAL LOW (ref 22–32)
Calcium: 7.5 mg/dL — ABNORMAL LOW (ref 8.9–10.3)
Chloride: 106 mmol/L (ref 98–111)
Creatinine, Ser: 1 mg/dL (ref 0.61–1.24)
GFR, Estimated: >60 mL/min (ref >60)
Glucose, Bld: 91 mg/dL (ref 70–99)
Potassium: 4.1 mmol/L (ref 3.5–5.1)
Sodium: 135 mmol/L (ref 135–145)

## 2023-05-10 NOTE — Progress Notes (Signed)
PROGRESS NOTE  Alan Tapia. SWF:[id number] DOB: 1975-11-10 DOA: 04/06/2023 PCP: Patient, No Pcp Per  Brief History   Alan Percell. is 47 y.o. male with extensive hospital course.  Patient initially presented septic due to nonhealing right foot ulcer.  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 Lajoyce Corners.  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  His postoperative course has been complicated by COVID-19, and social issues making dispo complex. Placement is still being sought.  The patient is resting comfortably. No new complaints. His significant other is at bedside. The patient is refusing IV placement and metoprolol. His blood pressures have been running high.  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 Lajoyce Corners seen on 12/18, wound VAC removed.  Continue with daily dry dressing changes with stump shrinker on top   Dyspnea COVID-19 infection (12/19) - Chest x-ray with some volume overload, status post 3-day course of Lasix --The patient refused any  further CXR on 05/08/2023 saying that I just wanted to charge him.  --given the patient's volume overload present on CXR from 04/26/2023 (his most recent) I will restart lasix. - Pt refused any interventions into his dyspneic episode this morning.  - Status post course of Paxlovid - Continue incentive spirometry --The patient is saturating 100% on 2 liters by nasal cannula. Will wean to off.   Microcytic anemia - FOBT negative - Status post PRBC transfusions during this hospitalization - Multifactorial from iron deficiency anemia and anemia of chronic disease   Hypertension - Patient is refusing antihypertensives.   Type 2 diabetes with hyperglycemia - Hemoglobin A1c 6.8% - Continue with sliding scale insulin, titrate up as tolerated --Glucoses are running from 88 to 148   AKI - Resolved - Renal ultrasound within normal limits --Creatinine is 1.0 on 05/10/2023   Vomiting - Suspected secondary to gastroparesis, resolved now.  Discontinue Reglan.   Complex disposition planning - Case management and social work working on [employer].  DSS also involved.  Reportedly patient's home is not safe for current needs.  He will need accessible housing.  Home health is also been a barrier. - TOC consulted and working on it   Pressure Injury 04/13/23 Perineum Bilateral Stage 2 -  Partial thickness loss of dermis presenting as a shallow open injury with a red, pink wound bed without slough. perineum/scrotum masd on admission (Active)  04/13/23 1100  Location: Perineum  Location Orientation: Bilateral  Staging: Stage 2 -  Partial thickness loss of dermis presenting as  a shallow open injury with a red, pink wound bed without slough.  Wound Description (Comments): perineum/scrotum masd on admission  Present on Admission:       DVT prophylaxis: lovenox   Code Status: Full Code Family Communication: Wife at bedside for discussin Disposition:  Status is: Inpatient Remains inpatient  appropriate because: dispo pending     Consultants:    Treatment Team:  Consulting Physician: Nadara Mustard, MD   Procedures:  R BKA   Fran Lowes, DO [facility] Direct contact: see [email or web address]  7PM-7AM contact night coverage as above 05/10/2023, 4:25 PM  LOS: 33 days   Interval History/Subjective  The patient is resting comfortably. No new complaints.  Objective   Vitals:  Vitals:   05/10/23 0533 05/10/23 0813  BP: (!) 160/95 (!) 152/89  Pulse: 92 93  Resp: 16 16  Temp: 97.7 F (36.5 C) 98 F (36.7 C)  SpO2: 100% 100%    Exam:  Constitutional:  The patient is awake, alert, and oriented x 3. He is in acute respiratory distress. Respiratory:  Positive for tachypnea and accessory muscle use to aid breathing. No wheezes, rales, or rhonchi Positive for diminished sounds at bases bilaterally. Cardiovascular:  Regular rate and rhythm No murmurs, ectopy, or gallups. No lateral PMI. No thrills. Abdomen:  Abdomen is soft, non-tender, non-distended No hernias, masses, or organomegaly Normoactive bowel sounds.  Musculoskeletal:  No cyanosis, clubbing, or edema Bilateral BKA's. Skin:  No rashes, lesions, ulcers palpation of skin: no induration or nodules Neurologic:  CN 2-12 intact Sensation all 4 extremities intact Psychiatric:  Mental status Mood - depressed, oppositional Orientation to person, place, time  judgment and insight do not appear intact  I have personally reviewed the following:   Today's Data   Vitals  Lab Data  CBC    Component Value Date/Time   WBC 6.3 05/10/2023 0518   RBC 3.10 (L) 05/10/2023 0518   HGB 7.9 (L) 05/10/2023 0518   HCT 26.8 (L) 05/10/2023 0518   PLT 284 05/10/2023 0518   MCV 86.5 05/10/2023 0518   MCH 25.5 (L) 05/10/2023 0518   MCHC 29.5 (L) 05/10/2023 0518   RDW 18.6 (H) 05/10/2023 0518   LYMPHSABS 1.9 05/10/2023 0518   MONOABS 0.5 05/10/2023 0518   EOSABS 0.1 05/10/2023 0518   BASOSABS 0.1  05/10/2023 0518      Latest Ref Rng & Units 05/10/2023    5:18 AM 05/08/2023    5:29 AM 05/03/2023   10:09 AM  BMP  Glucose 70 - 99 mg/dL 91  99  528   BUN 6 - 20 mg/dL 9  8  9    Creatinine 0.61 - 1.24 mg/dL 4.13  2.44  0.10   Sodium 135 - 145 mmol/L 135  139  136   Potassium 3.5 - 5.1 mmol/L 4.1  3.8  3.6   Chloride 98 - 111 mmol/L 106  107  106   CO2 22 - 32 mmol/L 20  27  26    Calcium 8.9 - 10.3 mg/dL 7.5  7.8  7.6      Micro Data   Results for orders placed or performed during the hospital encounter of 04/06/23  Culture, blood (Routine x 2)     Status: None   Collection Time: 04/06/23 10:20 PM   Specimen: BLOOD  Result Value Ref Range Status   Specimen Description   Final    BLOOD LEFT ANTECUBITAL Performed at [facility], [address] [place], [name]  [address]    Special Requests   Final    BOTTLES DRAWN AEROBIC AND ANAEROBIC Blood Culture adequate volume Performed at [facility], [address] [place], [name] [address]    Culture   Final    NO GROWTH 5 DAYS Performed at [facility], [address] [place], [name] [address]    Report Status 04/11/2023 FINAL  Final  Culture, blood (Routine x 2)     Status: None   Collection Time: 04/06/23 10:32 PM   Specimen: BLOOD  Result Value Ref Range Status   Specimen Description   Final    BLOOD BLOOD LEFT FOREARM Performed at [facility], [address], [place], [name] [address]    Special Requests   Final    BOTTLES DRAWN AEROBIC AND ANAEROBIC Blood Culture results may not be optimal due to an inadequate volume of blood received in culture bottles Performed at [facility] [address], [place], [name] [address]    Culture   Final    NO GROWTH 5 DAYS Performed at [facility], [address], [place], [name] [address]    Report Status 04/11/2023 FINAL  Final  Respiratory (~20 pathogens) panel by PCR      Status: None   Collection Time: 04/07/23  1:04 AM   Specimen: Nasopharyngeal Swab; Respiratory  Result Value Ref Range Status   Adenovirus NOT DETECTED NOT DETECTED Final   Coronavirus 229E NOT DETECTED NOT DETECTED Final    Comment: (NOTE) The Coronavirus on the Respiratory Panel, DOES NOT test for the novel  Coronavirus (2019 nCoV)    Coronavirus HKU1 NOT DETECTED NOT DETECTED Final   Coronavirus NL63 NOT DETECTED NOT DETECTED Final   Coronavirus OC43 NOT DETECTED NOT DETECTED Final   Metapneumovirus NOT DETECTED NOT DETECTED Final   Rhinovirus / Enterovirus NOT DETECTED NOT DETECTED Final   Influenza A NOT DETECTED NOT DETECTED Final   Influenza B NOT DETECTED NOT DETECTED Final   Parainfluenza Virus 1 NOT DETECTED NOT DETECTED Final   Parainfluenza Virus 2 NOT DETECTED NOT DETECTED Final   Parainfluenza Virus 3 NOT DETECTED NOT DETECTED Final   Parainfluenza Virus 4 NOT DETECTED NOT DETECTED Final   Respiratory Syncytial Virus NOT DETECTED NOT DETECTED Final   Bordetella pertussis NOT DETECTED NOT DETECTED Final   Bordetella Parapertussis NOT DETECTED NOT DETECTED Final   Chlamydophila pneumoniae NOT DETECTED NOT DETECTED Final   Mycoplasma pneumoniae NOT DETECTED NOT DETECTED Final    Comment: Performed at [facility], [address], [place], [name] [address]  Culture, blood (routine x 2) Call MD if unable to obtain prior to antibiotics being given     Status: None   Collection Time: 04/07/23  5:40 AM   Specimen: BLOOD RIGHT ARM  Result Value Ref Range Status   Specimen Description   Final    BLOOD RIGHT ARM Performed at [facility], [address], [place], [name] [address]    Special Requests   Final    BOTTLES DRAWN AEROBIC ONLY Blood Culture adequate volume Performed at [facility], [address], [place], [name] [address]    Culture   Final    NO GROWTH 5 DAYS Performed at [facility], [address] [place],  [name] [address]    Report Status 04/12/2023 FINAL  Final  Culture, blood (routine x 2) Call MD if unable to obtain prior to  antibiotics being given     Status: None   Collection Time: 04/07/23  5:40 AM   Specimen: BLOOD RIGHT HAND  Result Value Ref Range Status   Specimen Description   Final    BLOOD RIGHT HAND Performed at [facility], [address], [place], [name] [address]    Special Requests   Final    BOTTLES DRAWN AEROBIC ONLY Blood Culture adequate volume Performed at [facility], [address], [place], [name] [address]    Culture   Final    NO GROWTH 5 DAYS Performed at [facility], [address] [place], [name] [address]    Report Status 04/12/2023 FINAL  Final  Surgical pcr screen     Status: None   Collection Time: 04/08/23  2:10 AM   Specimen: Nasal Mucosa; Nasal Swab  Result Value Ref Range Status   MRSA, PCR NEGATIVE NEGATIVE Final   Staphylococcus aureus NEGATIVE NEGATIVE Final    Comment: (NOTE) The Xpert SA Assay (FDA approved for NASAL specimens in patients 78 years of age and older), is one component of a comprehensive surveillance program. It is not intended to diagnose infection nor to guide or monitor treatment. Performed at [facility], [address] [place], [name] [address]   SARS Coronavirus 2 by RT PCR (hospital order, performed in [facility] hospital lab) *cepheid single result test* Anterior Nasal Swab     Status: Abnormal   Collection Time: 04/30/23  1:10 PM   Specimen: Anterior Nasal Swab  Result Value Ref Range Status   SARS Coronavirus 2 by RT PCR POSITIVE (A) NEGATIVE Final    Comment: Performed at [facility], [address], [place], [name] [address]    Scheduled Meds:  (feeding supplement) PROSource Plus  30 mL Oral BID BM   docusate sodium  100 mg Oral BID   enoxaparin (LOVENOX) injection  70 mg Subcutaneous Daily   feeding supplement  237 mL Oral BID BM   folic acid  1 mg Oral  Daily   Gerhardt's butt cream   Topical BID   insulin aspart  0-9 Units Subcutaneous TID WC   metoprolol tartrate  50 mg Oral BID   multivitamin with minerals  1 tablet Oral Daily   pantoprazole  40 mg Oral BID   saccharomyces boulardii  250 mg Oral BID   sodium chloride flush  10 mL Intravenous Q12H   sodium chloride flush  3 mL Intravenous Q12H   Continuous Infusions:  Principal Problem:   Sepsis (HCC) Active Problems:   Acute osteomyelitis of right foot (HCC)   Acute on chronic anemia   Metabolic acidosis   CAP (community acquired pneumonia)   Diarrhea   Subacute osteomyelitis of right foot (HCC)   Subacute osteomyelitis, right ankle and foot (HCC)   Essential hypertension   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)   Insulin dependent type 2 diabetes mellitus (HCC)   CKD (chronic kidney disease), stage II   Hx of left BKA (HCC)   PVD (peripheral vascular disease) (HCC)   Cutaneous abscess of right foot   Malnutrition of moderate degree   COVID-19 virus infection   Pressure injury of skin   LOS: 33 days

## 2023-05-10 NOTE — Plan of Care (Signed)
°  Problem: Clinical Measurements: Goal: Diagnostic test results will improve Outcome: Not Progressing Goal: Respiratory complications will improve Outcome: Not Progressing Goal: Cardiovascular complication will be avoided Outcome: Progressing   Problem: Activity: Goal: Risk for activity intolerance will decrease Outcome: Progressing   Problem: Elimination: Goal: Will not experience complications related to bowel motility Outcome: Progressing Goal: Will not experience complications related to urinary retention Outcome: Progressing

## 2023-05-10 NOTE — Progress Notes (Signed)
Patient is refusing to take PO Metoprolol, and refuses an PIV. Patient was educated on the medical necessity for both interventions. Patient continues to refuse, MD notified

## 2023-05-11 DIAGNOSIS — A419 Sepsis, unspecified organism: Secondary | ICD-10-CM | POA: Diagnosis not present

## 2023-05-11 DIAGNOSIS — U071 COVID-19: Secondary | ICD-10-CM | POA: Diagnosis not present

## 2023-05-11 DIAGNOSIS — I5042 Chronic combined systolic (congestive) and diastolic (congestive) heart failure: Secondary | ICD-10-CM | POA: Diagnosis not present

## 2023-05-11 DIAGNOSIS — N182 Chronic kidney disease, stage 2 (mild): Secondary | ICD-10-CM | POA: Diagnosis not present

## 2023-05-11 DIAGNOSIS — E66812 Obesity, class 2: Secondary | ICD-10-CM | POA: Insufficient documentation

## 2023-05-11 DIAGNOSIS — E66813 Obesity, class 3: Secondary | ICD-10-CM | POA: Insufficient documentation

## 2023-05-11 LAB — GLUCOSE, CAPILLARY
Glucose-Capillary: 81 mg/dL (ref 70–99)
Glucose-Capillary: 96 mg/dL (ref 70–99)
Glucose-Capillary: 78 mg/dL (ref 70–99)

## 2023-05-11 LAB — BASIC METABOLIC PANEL
Anion gap: 8 (ref 5–15)
BUN: 8 mg/dL (ref 6–20)
CO2: 25 mmol/L (ref 22–32)
Calcium: 7.9 mg/dL — ABNORMAL LOW (ref 8.9–10.3)
Chloride: 107 mmol/L (ref 98–111)
Creatinine, Ser: 1.02 mg/dL (ref 0.61–1.24)
GFR, Estimated: >60 mL/min (ref >60)
Glucose, Bld: 87 mg/dL (ref 70–99)
Potassium: 3.8 mmol/L (ref 3.5–5.1)
Sodium: 140 mmol/L (ref 135–145)

## 2023-05-11 MED ORDER — SACUBITRIL-VALSARTAN 24-26 MG PO TABS
1.0000 | ORAL_TABLET | Freq: Two times a day (BID) | ORAL | Status: DC
Start: 2023-05-11 — End: 2023-05-13
  Administered 2023-05-11 – 2023-05-12 (×4): 1 via ORAL
  Filled 2023-05-11 (×6): qty 1

## 2023-05-11 MED ORDER — POLYSACCHARIDE IRON COMPLEX 150 MG PO CAPS
150.0000 mg | ORAL_CAPSULE | Freq: Every day | ORAL | Status: DC
Start: 2023-05-11 — End: 2023-05-15
  Administered 2023-05-11 – 2023-05-12 (×2): 150 mg via ORAL
  Filled 2023-05-11 (×5): qty 1

## 2023-05-11 MED ORDER — METOPROLOL TARTRATE 50 MG PO TABS
75.0000 mg | ORAL_TABLET | Freq: Two times a day (BID) | ORAL | Status: DC
  Administered 2023-05-11 – 2023-05-17 (×8): 75 mg via ORAL
  Filled 2023-05-11 (×3): qty 1
  Filled 2023-05-11: qty 2
  Filled 2023-05-11 (×8): qty 1

## 2023-05-11 NOTE — Assessment & Plan Note (Signed)
05-11-2023 resolved after right BKA on 04-08-2023.

## 2023-05-11 NOTE — Assessment & Plan Note (Signed)
05-11-2023 resolved after treatment and amputation of right foot.

## 2023-05-11 NOTE — Plan of Care (Signed)
Problem: Fluid Volume: Goal: Hemodynamic stability will improve Outcome: Progressing   Problem: Clinical Measurements: Goal: Diagnostic test results will improve Outcome: Progressing Goal: Signs and symptoms of infection will decrease Outcome: Progressing   Problem: Respiratory: Goal: Ability to maintain adequate ventilation will improve Outcome: Progressing   Problem: Education: Goal: Knowledge of General Education information will improve Description: Including pain rating scale, medication(s)/side effects and non-pharmacologic comfort measures Outcome: Progressing   Problem: Health Behavior/Discharge Planning: Goal: Ability to manage health-related needs will improve Outcome: Progressing   Problem: Clinical Measurements: Goal: Ability to maintain clinical measurements within normal limits will improve Outcome: Progressing Goal: Will remain free from infection Outcome: Progressing Goal: Diagnostic test results will improve Outcome: Progressing Goal: Respiratory complications will improve Outcome: Progressing Goal: Cardiovascular complication will be avoided Outcome: Progressing   Problem: Activity: Goal: Risk for activity intolerance will decrease Outcome: Progressing   Problem: Nutrition: Goal: Adequate nutrition will be maintained Outcome: Progressing   Problem: Coping: Goal: Level of anxiety will decrease Outcome: Progressing   Problem: Elimination: Goal: Will not experience complications related to bowel motility Outcome: Progressing Goal: Will not experience complications related to urinary retention Outcome: Progressing   Problem: Pain Management: Goal: General experience of comfort will improve Outcome: Progressing   Problem: Safety: Goal: Ability to remain free from injury will improve Outcome: Progressing   Problem: Skin Integrity: Goal: Risk for impaired skin integrity will decrease Outcome: Progressing   Problem: Activity: Goal: Ability  to tolerate increased activity will improve Outcome: Progressing   Problem: Clinical Measurements: Goal: Ability to maintain a body temperature in the normal range will improve Outcome: Progressing   Problem: Respiratory: Goal: Ability to maintain adequate ventilation will improve Outcome: Progressing Goal: Ability to maintain a clear airway will improve Outcome: Progressing   Problem: Education: Goal: Knowledge of the prescribed therapeutic regimen will improve Outcome: Progressing Goal: Ability to verbalize activity precautions or restrictions will improve Outcome: Progressing Goal: Understanding of discharge needs will improve Outcome: Progressing   Problem: Activity: Goal: Ability to perform//tolerate increased activity and mobilize with assistive devices will improve Outcome: Progressing   Problem: Clinical Measurements: Goal: Postoperative complications will be avoided or minimized Outcome: Progressing   Problem: Self-Care: Goal: Ability to meet self-care needs will improve Outcome: Progressing   Problem: Self-Concept: Goal: Ability to maintain and perform role responsibilities to the fullest extent possible will improve Outcome: Progressing   Problem: Pain Management: Goal: Pain level will decrease with appropriate interventions Outcome: Progressing   Problem: Skin Integrity: Goal: Demonstration of wound healing without infection will improve Outcome: Progressing   Problem: Education: Goal: Knowledge of General Education information will improve Description: Including pain rating scale, medication(s)/side effects and non-pharmacologic comfort measures Outcome: Progressing   Problem: Health Behavior/Discharge Planning: Goal: Ability to manage health-related needs will improve Outcome: Progressing   Problem: Clinical Measurements: Goal: Ability to maintain clinical measurements within normal limits will improve Outcome: Progressing Goal: Will remain free  from infection Outcome: Progressing Goal: Diagnostic test results will improve Outcome: Progressing Goal: Respiratory complications will improve Outcome: Progressing Goal: Cardiovascular complication will be avoided Outcome: Progressing   Problem: Activity: Goal: Risk for activity intolerance will decrease Outcome: Progressing   Problem: Nutrition: Goal: Adequate nutrition will be maintained Outcome: Progressing   Problem: Coping: Goal: Level of anxiety will decrease Outcome: Progressing   Problem: Elimination: Goal: Will not experience complications related to bowel motility Outcome: Progressing Goal: Will not experience complications related to urinary retention Outcome: Progressing   Problem: Pain Management: Goal:  General experience of comfort will improve Outcome: Progressing   Problem: Safety: Goal: Ability to remain free from injury will improve Outcome: Progressing   Problem: Skin Integrity: Goal: Risk for impaired skin integrity will decrease Outcome: Progressing   Problem: Education: Goal: Ability to describe self-care measures that may prevent or decrease complications (Diabetes Survival Skills Education) will improve Outcome: Progressing Goal: Individualized Educational Video(s) Outcome: Progressing   Problem: Coping: Goal: Ability to adjust to condition or change in health will improve Outcome: Progressing   Problem: Fluid Volume: Goal: Ability to maintain a balanced intake and output will improve Outcome: Progressing   Problem: Health Behavior/Discharge Planning: Goal: Ability to identify and utilize available resources and services will improve Outcome: Progressing Goal: Ability to manage health-related needs will improve Outcome: Progressing   Problem: Metabolic: Goal: Ability to maintain appropriate glucose levels will improve Outcome: Progressing   Problem: Nutritional: Goal: Maintenance of adequate nutrition will improve Outcome:  Progressing Goal: Progress toward achieving an optimal weight will improve Outcome: Progressing   Problem: Skin Integrity: Goal: Risk for impaired skin integrity will decrease Outcome: Progressing   Problem: Tissue Perfusion: Goal: Adequacy of tissue perfusion will improve Outcome: Progressing   Problem: Education: Goal: Knowledge of risk factors and measures for prevention of condition will improve Outcome: Progressing   Problem: Coping: Goal: Psychosocial and spiritual needs will be supported Outcome: Progressing   Problem: Respiratory: Goal: Will maintain a patent airway Outcome: Progressing Goal: Complications related to the disease process, condition or treatment will be avoided or minimized Outcome: Progressing

## 2023-05-11 NOTE — Assessment & Plan Note (Signed)
Treated and resolved °

## 2023-05-11 NOTE — Assessment & Plan Note (Signed)
05-11-2023 BMI 37.03

## 2023-05-11 NOTE — Plan of Care (Signed)
Problem: Fluid Volume: Goal: Hemodynamic stability will improve 05/11/2023 0001 by Paralee Cancel Chem, RN Outcome: Progressing 05/11/2023 0001 by Paralee Cancel Chem, RN Outcome: Progressing   Problem: Clinical Measurements: Goal: Diagnostic test results will improve 05/11/2023 0001 by Paralee Cancel Chem, RN Outcome: Progressing 05/11/2023 0001 by Paralee Cancel Chem, RN Outcome: Progressing Goal: Signs and symptoms of infection will decrease 05/11/2023 0001 by Paralee Cancel Chem, RN Outcome: Progressing 05/11/2023 0001 by Paralee Cancel Chem, RN Outcome: Progressing   Problem: Respiratory: Goal: Ability to maintain adequate ventilation will improve 05/11/2023 0001 by Paralee Cancel Chem, RN Outcome: Progressing 05/11/2023 0001 by Paralee Cancel Chem, RN Outcome: Progressing   Problem: Education: Goal: Knowledge of General Education information will improve Description: Including pain rating scale, medication(s)/side effects and non-pharmacologic comfort measures 05/11/2023 0001 by Paralee Cancel Chem, RN Outcome: Progressing 05/11/2023 0001 by Paralee Cancel Chem, RN Outcome: Progressing   Problem: Health Behavior/Discharge Planning: Goal: Ability to manage health-related needs will improve 05/11/2023 0001 by Paralee Cancel Chem, RN Outcome: Progressing 05/11/2023 0001 by Paralee Cancel Chem, RN Outcome: Progressing   Problem: Clinical Measurements: Goal: Ability to maintain clinical measurements within normal limits will improve 05/11/2023 0001 by Paralee Cancel Chem, RN Outcome: Progressing 05/11/2023 0001 by Paralee Cancel Chem, RN Outcome: Progressing Goal: Will remain free from infection 05/11/2023 0001 by Paralee Cancel Chem, RN Outcome: Progressing 05/11/2023 0001 by Paralee Cancel Chem, RN Outcome: Progressing Goal: Diagnostic test results will improve 05/11/2023 0001 by Corinda Gubler, RN Outcome: Progressing 05/11/2023 0001 by Paralee Cancel Chem, RN Outcome: Progressing Goal: Respiratory complications will improve 05/11/2023 0001 by Paralee Cancel Chem, RN Outcome: Progressing 05/11/2023 0001 by Paralee Cancel Chem, RN Outcome: Progressing Goal: Cardiovascular complication will be avoided 05/11/2023 0001 by Paralee Cancel Chem, RN Outcome: Progressing 05/11/2023 0001 by Paralee Cancel Chem, RN Outcome: Progressing   Problem: Activity: Goal: Risk for activity intolerance will decrease 05/11/2023 0001 by Oneal Deputy, Rickard Rhymes Chem, RN Outcome: Progressing 05/11/2023 0001 by Oneal Deputy, Rickard Rhymes Chem, RN Outcome: Progressing   Problem: Nutrition: Goal: Adequate nutrition will be maintained 05/11/2023 0001 by Paralee Cancel Chem, RN Outcome: Progressing 05/11/2023 0001 by Paralee Cancel Chem, RN Outcome: Progressing   Problem: Coping: Goal: Level of anxiety will decrease 05/11/2023 0001 by Paralee Cancel Chem, RN Outcome: Progressing 05/11/2023 0001 by Paralee Cancel Chem, RN Outcome: Progressing   Problem: Elimination: Goal: Will not experience complications related to bowel motility 05/11/2023 0001 by Paralee Cancel Chem, RN Outcome: Progressing 05/11/2023 0001 by Paralee Cancel Chem, RN Outcome: Progressing Goal: Will not experience complications related to urinary retention 05/11/2023 0001 by Corinda Gubler, RN Outcome: Progressing 05/11/2023 0001 by Paralee Cancel Chem, RN Outcome: Progressing   Problem: Pain Management: Goal: General experience of comfort will improve 05/11/2023 0001 by Paralee Cancel Chem,  RN Outcome: Progressing 05/11/2023 0001 by Paralee Cancel Chem, RN Outcome: Progressing   Problem: Safety: Goal: Ability to remain free from injury will improve 05/11/2023 0001 by Corinda Gubler, RN Outcome: Progressing 05/11/2023 0001 by Paralee Cancel Chem, RN Outcome: Progressing   Problem: Skin Integrity: Goal: Risk for impaired skin integrity will decrease 05/11/2023 0001 by Paralee Cancel Chem, RN Outcome: Progressing 05/11/2023 0001 by Paralee Cancel Chem, RN Outcome: Progressing   Problem: Activity: Goal: Ability to tolerate increased activity will improve  05/11/2023 0001 by Oneal Deputy, Rickard Rhymes Chem, RN Outcome: Progressing 05/11/2023 0001 by Paralee Cancel Chem, RN Outcome: Progressing   Problem: Clinical Measurements: Goal: Ability to maintain a body temperature in the normal range will improve 05/11/2023 0001 by Oneal Deputy, Rickard Rhymes Chem, RN Outcome: Progressing 05/11/2023 0001 by Paralee Cancel Chem, RN Outcome: Progressing   Problem: Respiratory: Goal: Ability to maintain adequate ventilation will improve 05/11/2023 0001 by Paralee Cancel Chem, RN Outcome: Progressing 05/11/2023 0001 by Paralee Cancel Chem, RN Outcome: Progressing Goal: Ability to maintain a clear airway will improve 05/11/2023 0001 by Paralee Cancel Chem, RN Outcome: Progressing 05/11/2023 0001 by Paralee Cancel Chem, RN Outcome: Progressing   Problem: Education: Goal: Knowledge of the prescribed therapeutic regimen will improve 05/11/2023 0001 by Paralee Cancel Chem, RN Outcome: Progressing 05/11/2023 0001 by Paralee Cancel Chem, RN Outcome: Progressing Goal: Ability to verbalize activity precautions or restrictions will improve 05/11/2023 0001 by Corinda Gubler, RN Outcome:  Progressing 05/11/2023 0001 by Paralee Cancel Chem, RN Outcome: Progressing Goal: Understanding of discharge needs will improve 05/11/2023 0001 by Corinda Gubler, RN Outcome: Progressing 05/11/2023 0001 by Paralee Cancel Chem, RN Outcome: Progressing   Problem: Activity: Goal: Ability to perform//tolerate increased activity and mobilize with assistive devices will improve 05/11/2023 0001 by Oneal Deputy, Arta Silence, RN Outcome: Progressing 05/11/2023 0001 by Paralee Cancel Chem, RN Outcome: Progressing   Problem: Clinical Measurements: Goal: Postoperative complications will be avoided or minimized 05/11/2023 0001 by Paralee Cancel Chem, RN Outcome: Progressing 05/11/2023 0001 by Paralee Cancel Chem, RN Outcome: Progressing   Problem: Self-Care: Goal: Ability to meet self-care needs will improve 05/11/2023 0001 by Paralee Cancel Chem, RN Outcome: Progressing 05/11/2023 0001 by Paralee Cancel Chem, RN Outcome: Progressing   Problem: Self-Concept: Goal: Ability to maintain and perform role responsibilities to the fullest extent possible will improve 05/11/2023 0001 by Oneal Deputy, Rickard Rhymes Chem, RN Outcome: Progressing 05/11/2023 0001 by Paralee Cancel Chem, RN Outcome: Progressing   Problem: Education: Goal: Knowledge of General Education information will improve Description: Including pain rating scale, medication(s)/side effects and non-pharmacologic comfort measures 05/11/2023 0001 by Paralee Cancel Chem, RN Outcome: Progressing 05/11/2023 0001 by Paralee Cancel Chem, RN Outcome: Progressing   Problem: Health Behavior/Discharge Planning: Goal: Ability to manage health-related needs will improve 05/11/2023 0001 by Paralee Cancel Chem, RN Outcome: Progressing 05/11/2023 0001 by Paralee Cancel Chem,  RN Outcome: Progressing   Problem: Clinical Measurements: Goal: Ability to maintain clinical measurements within normal limits will improve 05/11/2023 0001 by Paralee Cancel Chem, RN Outcome: Progressing 05/11/2023 0001 by Paralee Cancel Chem, RN Outcome: Progressing Goal: Will remain free from infection 05/11/2023 0001 by Paralee Cancel Chem, RN Outcome: Progressing 05/11/2023 0001 by Paralee Cancel Chem, RN Outcome: Progressing Goal: Diagnostic test results will improve 05/11/2023 0001 by Corinda Gubler, RN Outcome: Progressing 05/11/2023 0001 by Paralee Cancel Chem, RN Outcome: Progressing Goal: Respiratory complications will improve 05/11/2023 0001 by Paralee Cancel Chem, RN Outcome: Progressing 05/11/2023 0001 by Paralee Cancel Chem, RN Outcome: Progressing Goal: Cardiovascular complication will be avoided 05/11/2023 0001 by Paralee Cancel Chem, RN Outcome: Progressing 05/11/2023 0001 by Paralee Cancel Chem, RN Outcome: Progressing   Problem: Activity: Goal: Risk for activity intolerance will decrease 05/11/2023 0001 by Paralee Cancel Chem, RN Outcome: Progressing 05/11/2023 0001 by Paralee Cancel Chem, RN Outcome: Progressing  Problem: Nutrition: Goal: Adequate nutrition will be maintained 05/11/2023 0001 by Oneal Deputy, Rickard Rhymes Chem, RN Outcome: Progressing 05/11/2023 0001 by Oneal Deputy, Rickard Rhymes Chem, RN Outcome: Progressing   Problem: Coping: Goal: Level of anxiety will decrease 05/11/2023 0001 by Paralee Cancel Chem, RN Outcome: Progressing 05/11/2023 0001 by Paralee Cancel Chem, RN Outcome: Progressing   Problem: Elimination: Goal: Will not experience complications related to bowel motility 05/11/2023 0001 by Paralee Cancel Chem, RN Outcome:  Progressing 05/11/2023 0001 by Paralee Cancel Chem, RN Outcome: Progressing Goal: Will not experience complications related to urinary retention 05/11/2023 0001 by Corinda Gubler, RN Outcome: Progressing 05/11/2023 0001 by Paralee Cancel Chem, RN Outcome: Progressing   Problem: Pain Management: Goal: General experience of comfort will improve 05/11/2023 0001 by Paralee Cancel Chem, RN Outcome: Progressing 05/11/2023 0001 by Paralee Cancel Chem, RN Outcome: Progressing   Problem: Safety: Goal: Ability to remain free from injury will improve 05/11/2023 0001 by Corinda Gubler, RN Outcome: Progressing 05/11/2023 0001 by Paralee Cancel Chem, RN Outcome: Progressing   Problem: Skin Integrity: Goal: Risk for impaired skin integrity will decrease 05/11/2023 0001 by Paralee Cancel Chem, RN Outcome: Progressing 05/11/2023 0001 by Paralee Cancel Chem, RN Outcome: Progressing

## 2023-05-11 NOTE — Assessment & Plan Note (Addendum)
05-12-2023 continue with SSI. CBG in adequate ranges.

## 2023-05-11 NOTE — Assessment & Plan Note (Signed)
Resolved

## 2023-05-11 NOTE — Assessment & Plan Note (Addendum)
05-11-2023 messaged with infection control RN. Pt's covid test was positive on 04-30-2023. Today is 12 days from PCR. Pt can come out of droplet isolation. 05-12-2023 still has a slight cough. Advised pt and wife that this will get better over time.

## 2023-05-11 NOTE — Assessment & Plan Note (Signed)
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

## 2023-05-11 NOTE — Assessment & Plan Note (Signed)
05-11-2023 seen RD noted from 05-07-2023. "Moderate Malnutrition related to chronic illness as evidenced by energy intake < or equal to 75% for > or equal to 1 month, moderate fat depletion, moderate muscle depletion. "

## 2023-05-11 NOTE — Assessment & Plan Note (Signed)
05-11-2023 chronic.

## 2023-05-11 NOTE — Assessment & Plan Note (Addendum)
05-11-2023 chronic. stable. Start low dose entresto. Scr 1.02. euvolemic. Doesn't need scheduled lasix. 05-12-2023 started on low dose entresto 24-26 bid. Has had 3 doses so far.  Will check BMP in AM.

## 2023-05-11 NOTE — Assessment & Plan Note (Addendum)
05-11-2023 Scr 1.03 today. 05-12-2023 check BMP tomorrow since he has started [facility] yesterday.

## 2023-05-11 NOTE — Subjective & Objective (Addendum)
Pt seen and examined. [name] at bedside. Stable.

## 2023-05-11 NOTE — Assessment & Plan Note (Addendum)
05-11-2023 increase lopressor to 75 mg bid due to uncontrolled BP. Currently on 50 mg bid. 05-12-2023 pt has only had 2 doses of 75 mg lopressor. Will monitor today and adjust. HR 77. He has room to increase lopressor dose.

## 2023-05-11 NOTE — Progress Notes (Addendum)
PROGRESS NOTE    Alan Tapia.  WUJ:[id number] DOB: Nov 24, 1975 DOA: 04/06/2023 PCP: Patient, No Pcp Per  Subjective: Pt seen and examined. [name] at bedside. Pt is constipated.   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 He was also having nausea, vomiting and diarrhea. Orthopedic surgery was consulted and patient underwent right BKA 11/27 by Dr. Lajoyce Corners.  HPI: Alan Tapia. is a 47 y.o. 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  Patient reported he has significant diabetic foot infection.   During my evaluation at the bedside patient reported that he has been admitted in the [facility] in August 2024 when he had left-sided foot infection and since he has been discharged from the hospital unfortunately he lost follow-up with primary care doctor as well as lost follow-up with wound care clinic as well.  He has not has got any refill of any blood pressure regimen and insulin for last 4 months.   Patient reporting that he has ongoing nausea, vomiting and diarrhea for last 1 month.  Also patient noticed right-sided heel wound with malodorous discharge for last 2 weeks.  Patient denies any foot pain.  Denies any fever and chills at home.  Denies any abdominal pain. Patient  denies chest pain, orthopnea, PND, dyspnea, headache, change of appetite retroportal and constipation.   Antibiotic Therapy: Anti-infectives (From admission, onward)    Start     Dose/Rate Route Frequency Ordered Stop   04/30/23 1800  nirmatrelvir/ritonavir (PAXLOVID) 3 tablet        3 tablet Oral 2 times daily 04/30/23 1621 05/05/23 0820   04/09/23 1345  doxycycline (VIBRAMYCIN) 100 mg in dextrose 5 % 250 mL IVPB        100 mg 125 mL/hr over 120 Minutes Intravenous Every 12 hours 04/09/23 1252 04/09/23 1746   04/08/23 1300  levofloxacin (LEVAQUIN) IVPB 500 mg  Status:  Discontinued        500 mg 100 mL/hr over 60 Minutes Intravenous On call to O.R. 04/08/23 1229 04/08/23 1648   04/08/23 1300  vancomycin (VANCOREADY) IVPB 1500 mg/300 mL        1,500 mg 100 mL/hr over 180 Minutes Intravenous On call to O.R. 04/08/23 1229 04/08/23 1433   04/08/23 1237  vancomycin HCl (VANCOREADY) 1500 MG/300ML IVPB       Note to Pharmacy: [name] D: cabinet override      04/08/23 1237 04/08/23 1439   04/07/23 0800  piperacillin-tazobactam (ZOSYN) IVPB 3.375 g        3.375 g 12.5 mL/hr over 240 Minutes Intravenous Every 8 hours 04/07/23 0108 04/09/23 2108   04/07/23 0100  doxycycline (VIBRAMYCIN) 100 mg in dextrose 5 % 250 mL IVPB  Status:  Discontinued        100 mg 125 mL/hr over 120 Minutes Intravenous Every 12 hours 04/07/23 0057 04/09/23 1252   04/07/23 0000  ceFEPIme (MAXIPIME) 2 g in sodium chloride 0.9 % 100 mL IVPB  Status:  Discontinued        2 g 200 mL/hr over 30 Minutes Intravenous Every 8 hours 04/06/23 2349 04/07/23 0108   04/06/23 2330  metroNIDAZOLE (FLAGYL) IVPB 500 mg        500 mg 100 mL/hr over 60 Minutes Intravenous  Once 04/06/23 2321 04/07/23 0032   04/06/23 2300  piperacillin-tazobactam (ZOSYN) IVPB 3.375 g  Status:  Discontinued        3.375 g 100 mL/hr over 30 Minutes Intravenous  Once 04/06/23 2257 04/06/23 2320   04/06/23 2300  vancomycin (VANCOREADY) IVPB 2000  mg/400 mL  Status:  Discontinued        2,000 mg 200 mL/hr over 120 Minutes Intravenous  Once 04/06/23 2257 04/06/23 2312       Procedures: 04-08-2023 Right BKA  Consultants: [name]    Assessment and Plan: * Sepsis (HCC) 05-11-2023 resolved after treatment and amputation of right foot.  COVID-19 virus infection 05-11-2023 messaged with infection control RN. Pt's covid test was positive on 04-30-2023. Today is 12 days from PCR. Pt can come out of droplet isolation.  Diarrhea-resolved as of 05/11/2023 Resolved.  CAP (community acquired pneumonia)-resolved as of 05/11/2023 Treated and resolved.  Metabolic acidosis-resolved as of 05/11/2023 Resolved.  Acute on chronic anemia 05-11-2023 iron level is somewhat low. Will start nu-iron 150 mg daily. Iron/TIBC/Ferritin/ %Sat    Component Value Date/Time   IRON 34 (L) 04/10/2023 1338   TIBC NOT CALCULATED 04/10/2023 1338   IRONPCTSAT NOT CALCULATED 04/10/2023 1338     Acute osteomyelitis of right foot (HCC) 05-11-2023 resolved after right BKA on 04-08-2023.  Malnutrition of moderate degree 05-11-2023 seen RD noted from 05-07-2023. "Moderate Malnutrition related to chronic illness as evidenced by energy intake < or equal to 75% for > or equal to 1 month, moderate fat depletion, moderate muscle depletion. "  Cutaneous abscess of right foot 05-11-2023 resolved after right BKA on 04-08-2023.  Subacute osteomyelitis of right foot (HCC) 05-11-2023 resolved after right BKA on 04-08-2023.  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  Hx of left BKA (HCC) 05-11-2023 chronic.  CKD (chronic kidney disease), stage II 05-11-2023 Scr 1.03 today.  Insulin dependent type 2 diabetes mellitus (HCC) 05-11-2023 continue with SSI. CBG in adequate ranges.  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 stable. Start low dose entresto. Scr  1.02. euvolemic. Doesn't need scheduled lasix.  Essential hypertension 05-11-2023 increase lopressor to 75 mg bid due to uncontrolled BP. Currently on 50 mg bid.  Obesity, Class II, BMI 35-39.9 05-11-2023 BMI 37.03       DVT prophylaxis:   Pt is bilateral BKA   Code Status: Full Code Family Communication: discussed with pt and wife at bedside Disposition Plan: SNF Reason for continuing need for hospitalization: medically stable for DC.  Objective: Vitals:   05/10/23 1631 05/10/23 2210 05/11/23 0624 05/11/23 0850  BP: (!) 161/104 (!) 171/98 (!) 161/93 (!) 163/95  Pulse: (!) 102 95 78 90  Resp: 16 18 18 18   Temp: 97.7 F (36.5 C) 97.8 F (36.6 C) 97.7 F (36.5 C) 97.7 F (36.5 C)  TempSrc: Oral Oral Oral Oral  SpO2: 100% 99% 96% 99%  Weight:      Height:        Intake/Output Summary (Last 24 hours) at 05/11/2023 1403 Last data filed at 05/11/2023 0852 Gross per 24 hour  Intake 240 ml  Output 500 ml  Net -260 ml  Filed Weights   05/07/23 0508 05/08/23 0250 05/09/23 0312  Weight: (!) 138 kg (!) 138.3 kg (!) 138 kg   Weight Information (since admission)     Date/Time Weight Weight in lbs BSA (Calculated - sq m) BMI (Calculated) Who   05/09/23 0312 138 kg 304.24 lbs -- -- [facility]   05/08/23 0250 138.3 kg 304.9 lbs -- 37.13 MC   05/07/23 0508 138 kg 304.24 lbs -- 37.05 CC   05/06/23 0419 138.3 kg 304.9 lbs -- 37.13 JC   05/05/23 0600 141.9 kg 312.83 lbs -- 38.09 CC   05/04/23 0430 140.7 kg 310.19 lbs -- 37.77 CC   05/03/23 0550 137.5 kg 303.13 lbs -- 36.91 CC   05/02/23 0500 139.7 kg 307.98 lbs -- 37.5 JD   05/01/23 0418 136.3 kg 300.49 lbs -- 36.59 CC   04/30/23 0459 141.4 kg 311.73 lbs -- 37.96 JD   04/29/23 0415 144.7 kg 319.01 lbs -- 38.85 KN   04/28/23 0451 137.8 kg 303.79 lbs -- 36.99 CC   04/25/23 0418 137.2 kg 302.47 lbs -- 36.83 CC   04/24/23 0535 140.4 kg 309.53 lbs -- 37.69 CC   04/22/23 0513 139.1 kg 306.66 lbs -- 37.34 CC   04/20/23 0435 144 kg  317.46 lbs -- 38.66 SW   04/18/23 0500 143.5 kg 316.36 lbs -- 38.52 SW   04/17/23 0500 142.7 kg 314.6 lbs -- 38.31 CC   04/16/23 0456 142.2 kg 313.49 lbs -- 38.18 MC   04/15/23 04:08:45 141.7 kg 312.39 lbs -- 38.04 FY   04/14/23 0305 136.8 kg 301.59 lbs -- 36.73 RM   04/13/23 0500 132.1 kg 291.23 lbs -- 35.46 NF   04/12/23 0321 137.8 kg 303.79 lbs -- 36.99 AS   04/10/23 0503 140.1 kg 308.86 lbs -- 37.61 MM   04/09/23 0403 142.8 kg 314.82 lbs -- 38.34 LH   04/08/23 1244 142.9 kg 315.04 lbs 2.77 sq meters 38.36 [facility]   04/08/23 0500 142.9 kg 315.04 lbs -- 38.36 MJ   04/07/23 2119 144.8 kg 319.23 lbs -- 38.87 SS   04/07/23 2119 -- -- 2.79 sq meters -- MJ   04/06/23 2204 145.2 kg 320 lbs 2.79 sq meters 38.97 JP        Examination:  Physical Exam Vitals and nursing note reviewed.  Constitutional:      Appearance: He is obese.  HENT:     Head: Normocephalic and atraumatic.     Nose: Nose normal.  Cardiovascular:     Rate and Rhythm: Normal rate and regular rhythm.  Pulmonary:     Effort: Pulmonary effort is normal.     Breath sounds: Normal breath sounds.  Abdominal:     General: Bowel sounds are normal. There is no distension.     Palpations: Abdomen is soft.  Musculoskeletal:     Comments: Bilateral BKA Right stump incision well healed. Has small granuloma at lateral aspect of prior wound edge. Not draining or bleeding.  Skin:    Capillary Refill: Capillary refill takes less than 2 seconds.  Neurological:     Mental Status: He is alert and oriented to person, place, and time.     Data Reviewed: I have personally reviewed following labs and imaging studies  CBC: Recent Labs  Lab 05/08/23 0529 05/10/23 0518  WBC 8.9 6.3  NEUTROABS 6.1 3.7  HGB 9.0* 7.9*  HCT 30.7* 26.8*  MCV 86.7 86.5  PLT 494* 284   Basic Metabolic Panel: Recent Labs  Lab 05/08/23 0529  05/10/23 0518 05/11/23 0616  NA 139 135 140  K 3.8 4.1 3.8  CL 107 106 107  CO2 27 20* 25  GLUCOSE 99  91 87  BUN 8 9 8   CREATININE 1.04 1.00 1.02  CALCIUM 7.8* 7.5* 7.9*   GFR: Estimated Creatinine Clearance: 135.9 mL/min (by C-G formula based on SCr of 1.02 mg/dL).  BNP (last 3 results) Recent Labs    12/14/22 0839  BNP 197.3*   CBG: Recent Labs  Lab 05/10/23 1154 05/10/23 1707 05/10/23 2211 05/11/23 0852 05/11/23 1202  GLUCAP 101* 129* 88 81 96    Radiology Studies: DG CHEST PORT 1 VIEW Result Date: 05/10/2023 CLINICAL DATA:  Shortness of breath EXAM: PORTABLE CHEST 1 VIEW COMPARISON:  04/26/2023 FINDINGS: Stable cardiomediastinal silhouette. The right lung is clear. Moderate left loculated pleural effusion and associated airspace opacities, slightly increased from 04/26/2023. No pneumothorax. IMPRESSION: Moderate left loculated pleural effusion and associated airspace opacities, slightly increased from 04/26/2023. Electronically Signed   By: Minerva Fester M.D.   On: 05/10/2023 16:16    Scheduled Meds:  (feeding supplement) PROSource Plus  30 mL Oral BID BM   docusate sodium  100 mg Oral BID   enoxaparin (LOVENOX) injection  70 mg Subcutaneous Daily   feeding supplement  237 mL Oral BID BM   folic acid  1 mg Oral Daily   Gerhardt's butt cream   Topical BID   insulin aspart  0-9 Units Subcutaneous TID WC   iron polysaccharides  150 mg Oral Daily   metoprolol tartrate  75 mg Oral BID   multivitamin with minerals  1 tablet Oral Daily   pantoprazole  40 mg Oral BID   saccharomyces boulardii  250 mg Oral BID   sacubitril-valsartan  1 tablet Oral BID   sodium chloride flush  10 mL Intravenous Q12H   sodium chloride flush  3 mL Intravenous Q12H   Continuous Infusions:   LOS: 34 days   Time spent: 40 minutes  Carollee Herter, DO  [facility]  05/11/2023, 2:03 PM

## 2023-05-12 ENCOUNTER — Telehealth (HOSPITAL_COMMUNITY): Payer: Self-pay | Admitting: Pharmacy Technician

## 2023-05-12 ENCOUNTER — Telehealth: Payer: Self-pay | Admitting: Orthopedic Surgery

## 2023-05-12 ENCOUNTER — Other Ambulatory Visit (HOSPITAL_COMMUNITY): Payer: Self-pay

## 2023-05-12 DIAGNOSIS — I10 Essential (primary) hypertension: Secondary | ICD-10-CM | POA: Diagnosis not present

## 2023-05-12 DIAGNOSIS — N182 Chronic kidney disease, stage 2 (mild): Secondary | ICD-10-CM | POA: Diagnosis not present

## 2023-05-12 DIAGNOSIS — I5042 Chronic combined systolic (congestive) and diastolic (congestive) heart failure: Secondary | ICD-10-CM | POA: Diagnosis not present

## 2023-05-12 DIAGNOSIS — E119 Type 2 diabetes mellitus without complications: Secondary | ICD-10-CM | POA: Diagnosis not present

## 2023-05-12 DIAGNOSIS — E66812 Obesity, class 2: Secondary | ICD-10-CM

## 2023-05-12 LAB — GLUCOSE, CAPILLARY
Glucose-Capillary: 105 mg/dL — ABNORMAL HIGH (ref 70–99)
Glucose-Capillary: 147 mg/dL — ABNORMAL HIGH (ref 70–99)
Glucose-Capillary: 81 mg/dL (ref 70–99)
Glucose-Capillary: 62 mg/dL — ABNORMAL LOW (ref 70–99)
Glucose-Capillary: 85 mg/dL (ref 70–99)

## 2023-05-12 MED ORDER — METHOCARBAMOL 500 MG PO TABS
500.0000 mg | ORAL_TABLET | Freq: Three times a day (TID) | ORAL | Status: DC | PRN
  Administered 2023-05-12 – 2023-05-18 (×16): 500 mg via ORAL
  Filled 2023-05-12 (×16): qty 1

## 2023-05-12 NOTE — Progress Notes (Signed)
 Orthopedic Tech Progress Note Patient Details:  Alan Tapia. 01/09/1976 [id number]  Called in order to [facility] for BLE BKA SHRINKERS   Patient ID: [name], male   DOB: [birth year], 47 y.o.   MRN: [id number]  [name] Pac 05/12/2023, 2:37 PM

## 2023-05-12 NOTE — Telephone Encounter (Signed)
This pt is s/p a BKA and still in the hospital. His wife called and states that the pt needs a  shrinker. Can you place order for this?

## 2023-05-12 NOTE — Progress Notes (Signed)
 The patient asked for the cough meds and the cough syrup was given. Patient family called and said to come in and he felt like he is having a nausea and kind of wanted to throw up. The vitals were recorded and he said he became alright after few minutes, did not have any vomiting. The same cough med was given during the night and no complaints at that time.

## 2023-05-12 NOTE — Progress Notes (Signed)
 Physical Therapy Treatment Patient Details Name: Alan Tapia. MRN: [id number] DOB: 1976-04-07 Today's Date: 05/12/2023   History of Present Illness The pt is a 47 yo male presenting 11/25 with nausea, vomiting, and fever as well as R foot wound. 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    PT Comments  Pt received in supine, spouse present, pt agreeable to therapy session with emphasis on transfer training, but c/o increased pain/fatigue this date. After scooting toward HOB using overhead rails and rolling to L/R sides with use of bed rails and minA to lay on his side, pt c/o increased spasm type pain sensation and chest congestion due to phlegm that is difficult to bring up, RN notified immediately via landline phone in his room. Pt O2 [place] on floor and dirty and SpO2 97-98% on RA (pt did not have it on prior to session), RN notified old tubing was removed/thrown away due to being on floor and pt will need new O2 [place] tubing brought to his room. After rolling and bed linen change/hygiene assist, pt defers transfer OOB to wheelchair due to pain/fatigue, RN notified, pt agreeable to try again later in the day if there is time. Next session, plan to use Maxi-Slide pad that is in his room to assist with anterior/posterior scooting to the chair or wheelchair once he is able to tolerate it pain-wise. Patient will benefit from continued inpatient follow up therapy, <3 hours per day.     If plan is discharge home, recommend the following: Two people to help with walking and/or transfers;Assist for transportation;Assistance with cooking/housework;Help with stairs or ramp for entrance   Can travel by private vehicle     No  Equipment Recommendations  Hoyer lift;Wheelchair cushion (measurements PT);Wheelchair (measurements PT) (recommend  maxi-slide pad or similar, may need hospital bed unless he has a bed with rails and elevating HOB)    Recommendations for Other Services       Precautions / Restrictions Precautions Precautions: Fall Precaution Comments: RLE limb protector for OOB mobility Required Braces or Orthoses:  (BLE shrinker socks when at rest/in supine) Restrictions Weight Bearing Restrictions Per Provider Order: Yes RLE Weight Bearing Per Provider Order: Non weight bearing Other Position/Activity Restrictions: bilateral BKA (L chronic, R new)     Mobility  Bed Mobility Overal bed mobility: Needs Assistance Bed Mobility: Rolling Rolling: Min assist, Used rails, +2 for safety/equipment         General bed mobility comments: rolling to L/R sides multiple reps for peri-care and therapist/NT assisting with bed linen change, spouse also present for moral support. Pt appearing tearful after rolling or grimacing due to c/o increased pain with bed linen change and hygiene assist. Pt able to assist with  posterior supine scoot with bed in trend prior to elevating HOB again for improved pt comfort.    Transfers Overall transfer level: Needs assistance                 General transfer comment: Had planned to use maxi-slide under bed pad to assist pt to transfer OOB to chair/wheelchair, but pt defers today due to c/o pain.    Ambulation/Gait                   Stairs  Wheelchair Mobility     Tilt Bed    Modified Rankin (Stroke Patients Only)       Balance Overall balance assessment: Needs assistance Sitting-balance support: Bilateral upper extremity supported, Feet unsupported, No upper extremity supported   Sitting balance - Comments: pt defers long sitting due to cramping/spasm sensation                                    Cognition Arousal: Alert Behavior During Therapy: WFL for tasks assessed/performed Overall Cognitive Status: Within Functional  Limits for tasks assessed                                 General Comments: patient prefers to direct session/mobility. spouse present in room.        Exercises Other Exercises Other Exercises: HEP: Visit    [email or web address]    Access Code: [email or web address] ;pt had HEP handout under TV on shelf, PTA brought it closer to him, placed it on his bedside table and discussed that he can review it and attempt exercises from it later in the day if his pain improves.    General Comments General comments (skin integrity, edema, etc.): Pt able to assist with his posterior peri-care, PTA encouraged him to do this for himself to work on independence and also because pt reports when staff assists him with this, it is too painful. Bed linens changed under him as pt rolled to L/R sides and new bed pad placed behind him as well. Pt [place] was off when PTA arrived to room and SpO2 97% on RA, HR ~80 bpm, O2 [place] had fallen on floor so PTA placed it in trash and RN notified that pt may need new tubing brought to room, although currently SpO2 WFL on RA.      Pertinent Vitals/Pain Pain Assessment Pain Assessment: Faces Faces Pain Scale: Hurts whole lot Pain Location: pt inconsistently localizes his pain, pointing at times to R side and at times to abdomen/bottom with peri-care Pain Descriptors / Indicators: Discomfort, Grimacing, Guarding, Moaning, Cramping, Spasm Pain Intervention(s): Limited activity within patient's tolerance, Monitored during session, Repositioned, Patient requesting pain meds-RN notified, Other (comment) (pt had pain meds ~5 hours prior, RN notified pt c/o cramping/spasm type sensation mostly)    Home Living                          Prior Function            PT Goals (current goals can now be found in the care plan section) Acute Rehab PT Goals Patient Stated Goal: get bil prosthetics PT Goal Formulation: With patient Time For Goal Achievement: 04/30/23  (updated per chart review PT had added new goals last on 12/19) Progress towards PT goals: Progressing toward goals    Frequency    Min 1X/week      PT Plan      Co-evaluation              AM-PAC PT 6 Clicks Mobility   Outcome Measure  Help needed turning from your back to your side while in a flat bed without using bedrails?: A Little Help needed moving from lying on your back to sitting on the side of a flat bed without using bedrails?: A Lot Help needed moving to and from a bed  to a chair (including a wheelchair)?: A Lot Help needed standing up from a chair using your arms (e.g., wheelchair or bedside chair)?: Total Help needed to walk in hospital room?: Total Help needed climbing 3-5 steps with a railing? : Total 6 Click Score: 10    End of Session   Activity Tolerance: Patient limited by pain;Patient limited by fatigue;Other (comment) (secretion-laden cough and pt requesting RN be notified; PTA called RN while still in room to notify her) Patient left: in bed;with call bell/phone within reach;with bed alarm set Nurse Communication: Mobility status;Need for lift equipment;Other (comment);Patient requests pain meds (pt needs new O2 [place], refusing OOB attempts due to c/o severe pain, c/o increased secretions) PT Visit Diagnosis: Other abnormalities of gait and mobility (R26.89);Muscle weakness (generalized) (M62.81);Pain Pain - Right/Left: Right Pain - part of body: Leg (abdomen and legs)     Time: 8578-8553 PT Time Calculation (min) (ACUTE ONLY): 25 min  Charges:    $Therapeutic Activity: 23-37 mins PT General Charges $$ ACUTE PT VISIT: 1 Visit                     Hairo Garraway P., PTA Acute Rehabilitation Services Secure Chat Preferred 9a-5:30pm Office: [phone]    Connell HERO [facility] 05/12/2023, 3:25 PM

## 2023-05-12 NOTE — Progress Notes (Signed)
 Hypoglycemic Event  CBG: 62 mg/dl  Treatment: 4 oz juice/soda  Symptoms: None  Follow-up CBG: Time:2100 CBG Result:85mg /dl  Possible Reasons for Event: Vomiting and Inadequate meal intake  Comments/MD notified: Yes    Alan Tapia

## 2023-05-12 NOTE — Telephone Encounter (Signed)
[name] [name] called requesting a call back concerning pt stating rehab and need resources to help him. Pt is a double amputee. Please call [name] at [phone].

## 2023-05-12 NOTE — Telephone Encounter (Signed)
Patient's wife called. Says he needs a RX for a shrinker.

## 2023-05-12 NOTE — Progress Notes (Addendum)
 PROGRESS NOTE    Alan Tapia.  FMW:[id number] DOB: 11-11-1975 DOA: 04/06/2023 PCP: Patient, No Pcp Per  Subjective: Pt seen and examined. [name] at bedside. Stable.   Hospital Course: 47 year old male with past medical history significant for left lower extremity BKA, dilated cardiomyopathy, history of combined systolic and diastolic heart failure with EF 35 to 40%, hypertension, insulin -dependent diabetes, peripheral neuropathy, hyperlipidemia who presented to the emergency department on 04/06/2023 with complaints of odor, wound of his foot. He was also having nausea, vomiting and diarrhea. Orthopedic surgery was consulted and patient underwent right BKA 11/27 by Dr. Harden.  HPI: Alan Tapia. is a 47 y.o. male with medical history significant of right lower extremity below-knee amputation, dilated cardiomyopathy, combined systolic and diastolic heart failure with reduced EF 35 to 40%, essential hypertension, adjustment disorder, phantom phenomenon pain, insulin -dependent DM type II, peripheral neuropathy, hyperlipidemia and insomnia presented emergency departments for evaluation for malodorous wound of his left foot.  Patient reported he has significant diabetic foot infection.   During my evaluation at the bedside patient reported that he has been admitted in the [facility] in August 2024 when he had left-sided foot infection and since he has been discharged from the hospital unfortunately he lost follow-up with primary care doctor as well as lost follow-up with wound care clinic as well.  He has not has got any refill of any blood pressure regimen and insulin  for last 4 months.   Patient reporting that he has ongoing nausea, vomiting and diarrhea for last 1 month.  Also patient noticed right-sided heel wound with malodorous discharge for last 2 weeks.  Patient denies any foot pain.  Denies any fever and chills at home.  Denies any abdominal pain. Patient denies chest  pain, orthopnea, PND, dyspnea, headache, change of appetite retroportal and constipation.   Antibiotic Therapy: Anti-infectives (From admission, onward)    Start     Dose/Rate Route Frequency Ordered Stop   04/30/23 1800  nirmatrelvir /ritonavir  (PAXLOVID ) 3 tablet        3 tablet Oral 2 times daily 04/30/23 1621 05/05/23 0820   04/09/23 1345  doxycycline  (VIBRAMYCIN ) 100 mg in dextrose  5 % 250 mL IVPB        100 mg 125 mL/hr over 120 Minutes Intravenous Every 12 hours 04/09/23 1252 04/09/23 1746   04/08/23 1300  levofloxacin  (LEVAQUIN ) IVPB 500 mg  Status:  Discontinued        500 mg 100 mL/hr over 60 Minutes Intravenous On call to O.R. 04/08/23 1229 04/08/23 1648   04/08/23 1300  vancomycin  (VANCOREADY) IVPB 1500 mg/300 mL        1,500 mg 100 mL/hr over 180 Minutes Intravenous On call to O.R. 04/08/23 1229 04/08/23 1433   04/08/23 1237  vancomycin  HCl (VANCOREADY) 1500 MG/300ML IVPB       Note to Pharmacy: Barron Friday D: cabinet override      04/08/23 1237 04/08/23 1439   04/07/23 0800  piperacillin -tazobactam (ZOSYN ) IVPB 3.375 g        3.375 g 12.5 mL/hr over 240 Minutes Intravenous Every 8 hours 04/07/23 0108 04/09/23 2108   04/07/23 0100  doxycycline  (VIBRAMYCIN ) 100 mg in dextrose  5 % 250 mL IVPB  Status:  Discontinued        100 mg 125 mL/hr over 120 Minutes Intravenous Every 12 hours 04/07/23 0057 04/09/23 1252   04/07/23 0000  ceFEPIme  (MAXIPIME ) 2 g in sodium chloride  0.9 % 100 mL IVPB  Status:  Discontinued  2 g 200 mL/hr over 30 Minutes Intravenous Every 8 hours 04/06/23 2349 04/07/23 0108   04/06/23 2330  metroNIDAZOLE  (FLAGYL ) IVPB 500 mg        500 mg 100 mL/hr over 60 Minutes Intravenous  Once 04/06/23 2321 04/07/23 0032   04/06/23 2300  piperacillin -tazobactam (ZOSYN ) IVPB 3.375 g  Status:  Discontinued        3.375 g 100 mL/hr over 30 Minutes Intravenous  Once 04/06/23 2257 04/06/23 2320   04/06/23 2300  vancomycin  (VANCOREADY) IVPB 2000 mg/400 mL   Status:  Discontinued        2,000 mg 200 mL/hr over 120 Minutes Intravenous  Once 04/06/23 2257 04/06/23 2312       Procedures: 04-08-2023 Right BKA  Consultants: [name]    Assessment and Plan: * Sepsis (HCC) 05-11-2023 resolved after treatment and amputation of right foot.  COVID-19 virus infection 05-11-2023 messaged with infection control RN. Pt's covid test was positive on 04-30-2023. Today is 12 days from PCR. Pt can come out of droplet isolation. 05-12-2023 still has a slight cough. Advised pt and wife that this will get better over time.  Diarrhea-resolved as of 05/11/2023 Resolved.  CAP (community acquired pneumonia)-resolved as of 05/11/2023 Treated and resolved.  Metabolic acidosis-resolved as of 05/11/2023 Resolved.  Acute on chronic anemia 05-11-2023 iron  level is somewhat low. Will start nu-iron  150 mg daily. Iron /TIBC/Ferritin/ %Sat    Component Value Date/Time   IRON  34 (L) 04/10/2023 1338   TIBC NOT CALCULATED 04/10/2023 1338   IRONPCTSAT NOT CALCULATED 04/10/2023 1338   05-12-2023 continue nu-iron  150 mg daily.  Acute osteomyelitis of right foot (HCC) 05-11-2023 resolved after right BKA on 04-08-2023.  Malnutrition of moderate degree 05-11-2023 seen RD noted from 05-07-2023. Moderate Malnutrition related to chronic illness as evidenced by energy intake < or equal to 75% for > or equal to 1 month, moderate fat depletion, moderate muscle depletion.   Cutaneous abscess of right foot 05-11-2023 resolved after right BKA on 04-08-2023.  Subacute osteomyelitis of right foot (HCC) 05-11-2023 resolved after right BKA on 04-08-2023.  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  Hx of left BKA (HCC) 05-11-2023 chronic.  CKD (chronic kidney disease), stage II 05-11-2023 Scr 1.03 today. 05-12-2023 check BMP tomorrow since he has started Entresto  yesterday.  Insulin  dependent type 2 diabetes mellitus  (HCC) 05-12-2023 continue with SSI. CBG in adequate ranges.  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 05-11-2023 chronic. stable. Start low dose entresto . Scr 1.02. euvolemic. Doesn't need scheduled lasix . 05-12-2023 started on low dose entresto  24-26 bid. Has had 3 doses so far.  Will check BMP in AM.  Essential hypertension 05-11-2023 increase lopressor  to 75 mg bid due to uncontrolled BP. Currently on 50 mg bid. 05-12-2023 pt has only had 2 doses of 75 mg lopressor . Will monitor today and adjust. HR 77. He has room to increase lopressor  dose.  Obesity, Class II, BMI 35-39.9 05-11-2023 BMI 37.03       DVT prophylaxis:   Pt is bilateral BKA   Code Status: Full Code Family Communication: discussed with pt and wife at bedside Disposition Plan: SNF Reason for continuing need for hospitalization: medically stable for DC  Objective: Vitals:   05/12/23 0415 05/12/23 0500 05/12/23 0644 05/12/23 0740  BP: (!) 155/94  (!) 159/89 (!) 150/87  Pulse: 81  84 77  Resp: 18  20 18   Temp: 97.7 F (36.5 C)  (!)  97.5 F (36.4 C) (!) 97.3 F (36.3 C)  TempSrc: Oral  Oral Oral  SpO2: 100%  100% 100%  Weight:  134.4 kg    Height:        Intake/Output Summary (Last 24 hours) at 05/12/2023 1234 Last data filed at 05/12/2023 0900 Gross per 24 hour  Intake 480 ml  Output 800 ml  Net -320 ml   Filed Weights   05/08/23 0250 05/09/23 0312 05/12/23 0500  Weight: (!) 138.3 kg (!) 138 kg 134.4 kg    Examination:  Physical Exam Vitals and nursing note reviewed.  Constitutional:      General: He is not in acute distress.    Appearance: He is obese. He is not toxic-appearing or diaphoretic.  HENT:     Head: Normocephalic and atraumatic.     Nose: Nose normal.  Eyes:     General: No scleral icterus. Cardiovascular:     Rate and Rhythm: Normal rate and regular rhythm.  Pulmonary:     Effort: Pulmonary effort is normal.     Breath sounds:  Normal breath sounds.  Abdominal:     General: Bowel sounds are normal.  Musculoskeletal:     Comments: Bilateral BKA  Skin:    Capillary Refill: Capillary refill takes less than 2 seconds.  Neurological:     Mental Status: He is alert and oriented to person, place, and time.     Data Reviewed: I have personally reviewed following labs and imaging studies  CBC: Recent Labs  Lab 05/08/23 0529 05/10/23 0518  WBC 8.9 6.3  NEUTROABS 6.1 3.7  HGB 9.0* 7.9*  HCT 30.7* 26.8*  MCV 86.7 86.5  PLT 494* 284   Basic Metabolic Panel: Recent Labs  Lab 05/08/23 0529 05/10/23 0518 05/11/23 0616  NA 139 135 140  K 3.8 4.1 3.8  CL 107 106 107  CO2 27 20* 25  GLUCOSE 99 91 87  BUN 8 9 8   CREATININE 1.04 1.00 1.02  CALCIUM  7.8* 7.5* 7.9*   GFR: Estimated Creatinine Clearance: 134 mL/min (by C-G formula based on SCr of 1.02 mg/dL).  CBG: Recent Labs  Lab 05/11/23 0852 05/11/23 1202 05/11/23 2105 05/12/23 0739 05/12/23 1121  GLUCAP 81 96 78 105* 147*    Radiology Studies: DG CHEST PORT 1 VIEW Result Date: 05/10/2023 CLINICAL DATA:  Shortness of breath EXAM: PORTABLE CHEST 1 VIEW COMPARISON:  04/26/2023 FINDINGS: Stable cardiomediastinal silhouette. The right lung is clear. Moderate left loculated pleural effusion and associated airspace opacities, slightly increased from 04/26/2023. No pneumothorax. IMPRESSION: Moderate left loculated pleural effusion and associated airspace opacities, slightly increased from 04/26/2023. Electronically Signed   By: Norman Gatlin M.D.   On: 05/10/2023 16:16    Scheduled Meds:  (feeding supplement) PROSource Plus  30 mL Oral BID BM   docusate sodium   100 mg Oral BID   enoxaparin  (LOVENOX ) injection  70 mg Subcutaneous Daily   feeding supplement  237 mL Oral BID BM   folic acid   1 mg Oral Daily   Gerhardt's butt cream   Topical BID   insulin  aspart  0-9 Units Subcutaneous TID WC   iron  polysaccharides  150 mg Oral Daily   metoprolol   tartrate  75 mg Oral BID   multivitamin with minerals  1 tablet Oral Daily   pantoprazole   40 mg Oral BID   saccharomyces boulardii  250 mg Oral BID   sacubitril -valsartan   1 tablet Oral BID   sodium chloride  flush  10 mL Intravenous  Q12H   sodium chloride  flush  3 mL Intravenous Q12H   Continuous Infusions:   LOS: 35 days   Time spent: 35 minutes  Camellia Door, DO  [facility]  05/12/2023, 12:34 PM

## 2023-05-12 NOTE — Telephone Encounter (Signed)
 Patient Product/process Development Scientist completed.    The patient is insured through [employer] and [employer].     Ran test claim for Entresto  24-26 mg and the current 30 day co-pay is $0.00.   This test claim was processed through [facility]- copay amounts may vary at other pharmacies due to pharmacy/plan contracts, or as the patient moves through the different stages of their insurance plan.     Reyes Sharps, CPHT Pharmacy Technician III Certified Patient Advocate [facility] Patient Advocate Team Direct Number: [phone]  Fax: [phone]

## 2023-05-12 NOTE — Progress Notes (Signed)
Patient refused PO Metoprolol, vomited once and refusing to take zofran PO. On call provider made aware.

## 2023-05-13 DIAGNOSIS — I10 Essential (primary) hypertension: Secondary | ICD-10-CM | POA: Diagnosis not present

## 2023-05-13 DIAGNOSIS — E440 Moderate protein-calorie malnutrition: Secondary | ICD-10-CM

## 2023-05-13 DIAGNOSIS — Z89511 Acquired absence of right leg below knee: Secondary | ICD-10-CM

## 2023-05-13 DIAGNOSIS — I5042 Chronic combined systolic (congestive) and diastolic (congestive) heart failure: Secondary | ICD-10-CM | POA: Diagnosis not present

## 2023-05-13 LAB — COMPREHENSIVE METABOLIC PANEL
ALT: 14 U/L (ref 0–44)
AST: 14 U/L — ABNORMAL LOW (ref 15–41)
Albumin: <1.5 g/dL — ABNORMAL LOW (ref 3.5–5.0)
Alkaline Phosphatase: 229 U/L — ABNORMAL HIGH (ref 38–126)
Anion gap: 4 — ABNORMAL LOW (ref 5–15)
BUN: 7 mg/dL (ref 6–20)
CO2: 27 mmol/L (ref 22–32)
Calcium: 7.8 mg/dL — ABNORMAL LOW (ref 8.9–10.3)
Chloride: 108 mmol/L (ref 98–111)
Creatinine, Ser: 0.95 mg/dL (ref 0.61–1.24)
GFR, Estimated: >60 mL/min (ref >60)
Glucose, Bld: 78 mg/dL (ref 70–99)
Potassium: 3.8 mmol/L (ref 3.5–5.1)
Sodium: 139 mmol/L (ref 135–145)
Total Bilirubin: 0.2 mg/dL (ref 0.0–1.2)
Total Protein: 5.7 g/dL — ABNORMAL LOW (ref 6.5–8.1)

## 2023-05-13 LAB — GLUCOSE, CAPILLARY
Glucose-Capillary: 75 mg/dL (ref 70–99)
Glucose-Capillary: 85 mg/dL (ref 70–99)
Glucose-Capillary: 76 mg/dL (ref 70–99)
Glucose-Capillary: 84 mg/dL (ref 70–99)

## 2023-05-13 MED ORDER — LIDOCAINE 5 % EX PTCH
2.0000 | MEDICATED_PATCH | CUTANEOUS | Status: DC
  Administered 2023-05-13 – 2023-06-26 (×41): 2 via TRANSDERMAL
  Filled 2023-05-13 (×42): qty 2

## 2023-05-13 MED ORDER — SACUBITRIL-VALSARTAN 49-51 MG PO TABS
1.0000 | ORAL_TABLET | Freq: Two times a day (BID) | ORAL | Status: DC
  Administered 2023-05-15 – 2023-05-20 (×10): 1 via ORAL
  Filled 2023-05-13 (×15): qty 1

## 2023-05-13 MED ORDER — DM-GUAIFENESIN ER 30-600 MG PO TB12: 1.0000 | ORAL_TABLET | Freq: Two times a day (BID) | ORAL | Status: DC | PRN

## 2023-05-13 MED ORDER — DICLOFENAC SODIUM 1 % EX GEL: 2.0000 g | Freq: Four times a day (QID) | CUTANEOUS | Status: DC | PRN

## 2023-05-13 NOTE — Assessment & Plan Note (Addendum)
 05-13-2023 pt has stump shrinker on right LE stump 05-14-2023 has stump shrinkers on both LE stumps 05-18-2023 only has stump shrinker on right LE today. No stump shrinker on Left LE today.  05-20-2023 has stump shrinkers on both LE stumps

## 2023-05-13 NOTE — Plan of Care (Signed)
  Problem: Clinical Measurements: Goal: Ability to maintain clinical measurements within normal limits will improve Outcome: Progressing Goal: Cardiovascular complication will be avoided Outcome: Progressing   Problem: Nutrition: Goal: Adequate nutrition will be maintained Outcome: Not Progressing   Problem: Coping: Goal: Level of anxiety will decrease Outcome: Not Progressing   Problem: Elimination: Goal: Will not experience complications related to bowel motility Outcome: Progressing Goal: Will not experience complications related to urinary retention Outcome: Progressing   Problem: Respiratory: Goal: Ability to maintain adequate ventilation will improve Outcome: Progressing   Problem: Education: Goal: Ability to verbalize activity precautions or restrictions will improve Outcome: Progressing

## 2023-05-13 NOTE — Progress Notes (Signed)
 PROGRESS NOTE    Alan Tapia.  FMW:[id number] DOB: Aug 16, 1975 DOA: 04/06/2023 PCP: Patient, No Pcp Per  Subjective: Pt seen and examined. [name] at bedside. Stable. Pt had vomiting episode after taking meds all at once yesterday. However, his chest is less congested after vomiting.  Pt has bilateral BKA stump shrinkers on.   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 He was also having nausea, vomiting and diarrhea. Orthopedic surgery was consulted and patient underwent right BKA 11/27 by Dr. Harden.  HPI: Alan Tapia. is a 48 y.o. male with medical history significant of right lower extremity below-knee amputation, dilated cardiomyopathy, combined systolic and diastolic heart failure with reduced EF 35 to 40%, essential hypertension, adjustment disorder, phantom phenomenon pain, insulin -dependent DM type II, peripheral neuropathy, hyperlipidemia and insomnia presented emergency departments for evaluation for malodorous wound of his left foot.  Patient reported he has significant diabetic foot infection.   During my evaluation at the bedside patient reported that he has been admitted in the [facility] in August 2024 when he had left-sided foot infection and since he has been discharged from the hospital unfortunately he lost follow-up with primary care doctor as well as lost follow-up with wound care clinic as well.  He has not has got any refill of any blood pressure regimen and insulin  for last 4 months.   Patient reporting that he has ongoing nausea, vomiting and diarrhea for last 1 month.  Also patient noticed right-sided heel wound  with malodorous discharge for last 2 weeks.  Patient denies any foot pain.  Denies any fever and chills at home.  Denies any abdominal pain. Patient denies chest pain, orthopnea, PND, dyspnea, headache, change of appetite retroportal and constipation.   Antibiotic Therapy: Anti-infectives (From admission, onward)    Start     Dose/Rate Route Frequency Ordered Stop   04/30/23 1800  nirmatrelvir /ritonavir  (PAXLOVID ) 3 tablet        3 tablet Oral 2 times daily 04/30/23 1621 05/05/23 0820   04/09/23 1345  doxycycline  (VIBRAMYCIN ) 100 mg in dextrose  5 % 250 mL IVPB        100 mg 125 mL/hr over 120 Minutes Intravenous Every 12 hours 04/09/23 1252 04/09/23 1746   04/08/23 1300  levofloxacin  (LEVAQUIN ) IVPB 500 mg  Status:  Discontinued        500 mg 100 mL/hr over 60 Minutes Intravenous On call to O.R. 04/08/23 1229 04/08/23 1648   04/08/23 1300  vancomycin  (VANCOREADY) IVPB 1500 mg/300 mL        1,500 mg 100 mL/hr over 180 Minutes Intravenous On call to O.R. 04/08/23 1229 04/08/23 1433   04/08/23 1237  vancomycin  HCl (VANCOREADY) 1500 MG/300ML IVPB       Note to Pharmacy: Barron Friday D: cabinet override      04/08/23 1237 04/08/23 1439   04/07/23 0800  piperacillin -tazobactam (ZOSYN ) IVPB 3.375 g        3.375 g 12.5 mL/hr over 240 Minutes Intravenous Every 8 hours 04/07/23 0108 04/09/23 2108   04/07/23 0100  doxycycline  (VIBRAMYCIN ) 100 mg in dextrose  5 % 250 mL IVPB  Status:  Discontinued        100 mg 125 mL/hr over 120 Minutes Intravenous Every  12 hours 04/07/23 0057 04/09/23 1252   04/07/23 0000  ceFEPIme  (MAXIPIME ) 2 g in sodium chloride  0.9 % 100 mL IVPB  Status:  Discontinued        2 g 200 mL/hr over 30 Minutes Intravenous Every 8 hours 04/06/23 2349 04/07/23 0108   04/06/23 2330  metroNIDAZOLE  (FLAGYL ) IVPB 500 mg        500 mg 100 mL/hr over 60 Minutes Intravenous  Once 04/06/23 2321 04/07/23 0032   04/06/23 2300  piperacillin -tazobactam (ZOSYN ) IVPB 3.375 g  Status:   Discontinued        3.375 g 100 mL/hr over 30 Minutes Intravenous  Once 04/06/23 2257 04/06/23 2320   04/06/23 2300  vancomycin  (VANCOREADY) IVPB 2000 mg/400 mL  Status:  Discontinued        2,000 mg 200 mL/hr over 120 Minutes Intravenous  Once 04/06/23 2257 04/06/23 2312       Procedures: 04-08-2023 Right BKA  Consultants: [name]    Assessment and Plan: * Sepsis (HCC) 05-11-2023 resolved after treatment and amputation of right foot.  S/P BKA (below knee amputation), right [facility]) - 04-08-2023 05-13-2023 pt has stump shrinker on right LE stump  CKD (chronic kidney disease), stage II 05-11-2023 Scr 1.03 today. 05-12-2023 check BMP tomorrow since he has started Entresto  yesterday.  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 05-11-2023 chronic. stable. Start low dose entresto . Scr 1.02. euvolemic. Doesn't need scheduled lasix . 05-12-2023 started on low dose entresto  24-26 bid. Has had 3 doses so far.  Will check BMP in AM.  05-13-2023 awaiting this AM labs. May be able to increase entresto  dose.  Essential hypertension 05-11-2023 increase lopressor  to 75 mg bid due to uncontrolled BP. Currently on 50 mg bid. 05-12-2023 pt has only had 2 doses of 75 mg lopressor . Will monitor today and adjust. HR 77. He has room to increase lopressor  dose.  05-13-2023 he refused last night's dose of lopressor . HR 101 this AM. May need to change to Toprol -XL to ensure better compliance  Diarrhea-resolved as of 05/11/2023 Resolved.  CAP (community acquired pneumonia)-resolved as of 05/11/2023 Treated and resolved.  Metabolic acidosis-resolved as of 05/11/2023 Resolved.  Acute on chronic anemia 05-11-2023 iron  level is somewhat low. Will start nu-iron  150 mg daily. Iron /TIBC/Ferritin/ %Sat    Component Value Date/Time   IRON  34 (L) 04/10/2023 1338   TIBC NOT CALCULATED 04/10/2023 1338   IRONPCTSAT NOT CALCULATED 04/10/2023 1338    05-12-2023 continue nu-iron  150 mg daily.  Acute osteomyelitis of right foot (HCC) 05-11-2023 resolved after right BKA on 04-08-2023.  COVID-19 virus infection 05-11-2023 messaged with infection control RN. Pt's covid test was positive on 04-30-2023. Today is 12 days from PCR. Pt can come out of droplet isolation. 05-12-2023 still has a slight cough. Advised pt and wife that this will get better over time.  Malnutrition of moderate degree 05-11-2023 seen RD noted from 05-07-2023. Moderate Malnutrition related to chronic illness as evidenced by energy intake < or equal to 75% for > or equal to 1 month, moderate fat depletion, moderate muscle depletion.   Cutaneous abscess of right foot 05-11-2023 resolved after right BKA on 04-08-2023.  Subacute osteomyelitis of right foot (HCC) 05-11-2023 resolved after right BKA on 04-08-2023.  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  Hx of left BKA (HCC) 05-11-2023 chronic. 05-13-2023 has stump shrinker on left LE stump  Insulin  dependent type 2 diabetes mellitus (HCC) 05-12-2023 continue  with SSI. CBG in adequate ranges.  Obesity, Class II, BMI 35-39.9 05-11-2023 BMI 37.03  05-13-2023 pt has lost 28 lbs since admission. Admit weight of 320 lbs. Current weight of 292.11 lbs.   DVT prophylaxis: has bilateral BKAs     Code Status: Full Code Family Communication: discussed with pt's wife at bedside Disposition Plan: SNF Reason for continuing need for hospitalization: medically stable for DC.  Objective: Vitals:   05/12/23 1938 05/13/23 0308 05/13/23 0331 05/13/23 0808  BP: (!) 152/76 (!) 158/65  (!) 161/81  Pulse: 85 (!) 105  (!) 102  Resp: 20 20  16   Temp: 97.9 F (36.6 C) 98.7 F (37.1 C)  98.5 F (36.9 C)  TempSrc: Oral Oral  Oral  SpO2: 95% 93%  93%  Weight:   132.5 kg   Height:        Intake/Output Summary (Last 24 hours) at 05/13/2023 9077 Last data filed at 05/13/2023 0330 Gross  per 24 hour  Intake 720 ml  Output 600 ml  Net 120 ml   Filed Weights   05/09/23 0312 05/12/23 0500 05/13/23 0331  Weight: (!) 138 kg 134.4 kg 132.5 kg   Weight Information (since admission)     Date/Time Weight Weight in lbs BSA (Calculated - sq m) BMI (Calculated) Who   05/13/23 0331 132.5 kg 292.11 lbs -- -- KN   05/12/23 0500 134.4 kg 296.3 lbs -- -- KN   05/09/23 0312 138 kg 304.24 lbs -- -- [facility]   05/08/23 0250 138.3 kg 304.9 lbs -- 37.13 MC   05/07/23 0508 138 kg 304.24 lbs -- 37.05 CC   05/06/23 0419 138.3 kg 304.9 lbs -- 37.13 JC   05/05/23 0600 141.9 kg 312.83 lbs -- 38.09 CC   05/04/23 0430 140.7 kg 310.19 lbs -- 37.77 CC   05/03/23 0550 137.5 kg 303.13 lbs -- 36.91 CC   05/02/23 0500 139.7 kg 307.98 lbs -- 37.5 JD   05/01/23 0418 136.3 kg 300.49 lbs -- 36.59 CC   04/30/23 0459 141.4 kg 311.73 lbs -- 37.96 JD   04/29/23 0415 144.7 kg 319.01 lbs -- 38.85 KN   04/28/23 0451 137.8 kg 303.79 lbs -- 36.99 CC   04/25/23 0418 137.2 kg 302.47 lbs -- 36.83 CC   04/24/23 0535 140.4 kg 309.53 lbs -- 37.69 CC   04/22/23 0513 139.1 kg 306.66 lbs -- 37.34 CC   04/20/23 0435 144 kg 317.46 lbs -- 38.66 SW   04/18/23 0500 143.5 kg 316.36 lbs -- 38.52 SW   04/17/23 0500 142.7 kg 314.6 lbs -- 38.31 CC   04/16/23 0456 142.2 kg 313.49 lbs -- 38.18 MC   04/15/23 04:08:45 141.7 kg 312.39 lbs -- 38.04 FY   04/14/23 0305 136.8 kg 301.59 lbs -- 36.73 RM   04/13/23 0500 132.1 kg 291.23 lbs -- 35.46 NF   04/12/23 0321 137.8 kg 303.79 lbs -- 36.99 AS   04/10/23 0503 140.1 kg 308.86 lbs -- 37.61 MM   04/09/23 0403 142.8 kg 314.82 lbs -- 38.34 LH   04/08/23 1244 142.9 kg 315.04 lbs 2.77 sq meters 38.36 [facility]   04/08/23 0500 142.9 kg 315.04 lbs -- 38.36 MJ   04/07/23 2119 144.8 kg 319.23 lbs -- 38.87 SS   04/07/23 2119 -- -- 2.79 sq meters -- MJ   04/06/23 2204 145.2 kg 320 lbs 2.79 sq meters 38.97 JP       Examination:  Physical Exam Vitals and nursing note reviewed.  Constitutional:  Appearance: He is obese.  HENT:     Head: Normocephalic and atraumatic.     Nose: Nose normal.  Cardiovascular:     Rate and Rhythm: Normal rate and regular rhythm.  Pulmonary:     Effort: Pulmonary effort is normal.     Breath sounds: Normal breath sounds.  Abdominal:     General: Bowel sounds are normal.     Palpations: Abdomen is soft.  Musculoskeletal:     Comments: Bilateral BKA Has stump shrinkers on both LE BKA stumps  Skin:    General: Skin is warm and dry.     Capillary Refill: Capillary refill takes less than 2 seconds.  Neurological:     Mental Status: He is alert and oriented to person, place, and time.     Data Reviewed: I have personally reviewed following labs and imaging studies  CBC: Recent Labs  Lab 05/08/23 0529 05/10/23 0518  WBC 8.9 6.3  NEUTROABS 6.1 3.7  HGB 9.0* 7.9*  HCT 30.7* 26.8*  MCV 86.7 86.5  PLT 494* 284   Basic Metabolic Panel: Recent Labs  Lab 05/08/23 0529 05/10/23 0518 05/11/23 0616  NA 139 135 140  K 3.8 4.1 3.8  CL 107 106 107  CO2 27 20* 25  GLUCOSE 99 91 87  BUN 8 9 8   CREATININE 1.04 1.00 1.02  CALCIUM  7.8* 7.5* 7.9*   GFR: Estimated Creatinine Clearance: 133.1 mL/min (by C-G formula based on SCr of 1.02 mg/dL). BNP (last 3 results) Recent Labs    12/14/22 0839  BNP 197.3*   CBG: Recent Labs  Lab 05/12/23 1121 05/12/23 1643 05/12/23 1943 05/12/23 2051 05/13/23 0828  GLUCAP 147* 81 62* 85 75    Scheduled Meds:  (feeding supplement) PROSource Plus  30 mL Oral BID BM   docusate sodium   100 mg Oral BID   enoxaparin  (LOVENOX ) injection  70 mg Subcutaneous Daily   feeding supplement  237 mL Oral BID BM   folic acid   1 mg Oral Daily   Gerhardt's butt cream   Topical BID   insulin  aspart  0-9 Units Subcutaneous TID WC   iron  polysaccharides  150 mg Oral Daily   metoprolol  tartrate  75 mg Oral BID   multivitamin with minerals  1 tablet Oral Daily   pantoprazole   40 mg Oral BID   saccharomyces boulardii   250 mg Oral BID   sacubitril -valsartan   1 tablet Oral BID   sodium chloride  flush  10 mL Intravenous Q12H   sodium chloride  flush  3 mL Intravenous Q12H   Continuous Infusions:   LOS: 36 days   Time spent: 35 minutes  Camellia Door, DO  [facility]  05/13/2023, 9:22 AM

## 2023-05-13 NOTE — Progress Notes (Signed)
   Basic Metabolic Panel: Recent Labs  Lab 05/08/23 0529 05/10/23 0518 05/11/23 0616 05/13/23 0818  NA 139 135 140 139  K 3.8 4.1 3.8 3.8  CL 107 106 107 108  CO2 27 20* 25 27  GLUCOSE 99 91 87 78  BUN 8 9 8 7   CREATININE 1.04 1.00 1.02 0.95  CALCIUM  7.8* 7.5* 7.9* 7.8*   Labs from today shows stable BUN/Scr. BP 161/81. Will increase entresto  to 49-51 bid.  Camellia Door, DO [facility]

## 2023-05-13 NOTE — Plan of Care (Signed)
  Problem: Fluid Volume: Goal: Hemodynamic stability will improve Outcome: Progressing   Problem: Clinical Measurements: Goal: Diagnostic test results will improve Outcome: Progressing   Problem: Education: Goal: Knowledge of General Education information will improve Description: Including pain rating scale, medication(s)/side effects and non-pharmacologic comfort measures Outcome: Progressing   Problem: Clinical Measurements: Goal: Will remain free from infection Outcome: Progressing   Problem: Nutrition: Goal: Adequate nutrition will be maintained Outcome: Progressing

## 2023-05-14 ENCOUNTER — Telehealth: Payer: Self-pay | Admitting: Orthopedic Surgery

## 2023-05-14 DIAGNOSIS — I10 Essential (primary) hypertension: Secondary | ICD-10-CM | POA: Diagnosis not present

## 2023-05-14 DIAGNOSIS — I5042 Chronic combined systolic (congestive) and diastolic (congestive) heart failure: Secondary | ICD-10-CM | POA: Diagnosis not present

## 2023-05-14 DIAGNOSIS — N182 Chronic kidney disease, stage 2 (mild): Secondary | ICD-10-CM | POA: Diagnosis not present

## 2023-05-14 DIAGNOSIS — E440 Moderate protein-calorie malnutrition: Secondary | ICD-10-CM | POA: Diagnosis not present

## 2023-05-14 LAB — GLUCOSE, CAPILLARY
Glucose-Capillary: 80 mg/dL (ref 70–99)
Glucose-Capillary: 97 mg/dL (ref 70–99)
Glucose-Capillary: 97 mg/dL (ref 70–99)
Glucose-Capillary: 87 mg/dL (ref 70–99)

## 2023-05-14 MED ORDER — JUVEN PO PACK
1.0000 | PACK | Freq: Two times a day (BID) | ORAL | Status: DC
  Administered 2023-05-15 – 2023-05-27 (×20): 1 via ORAL
  Filled 2023-05-14 (×17): qty 1

## 2023-05-14 NOTE — Plan of Care (Signed)
 Patient non compliant to scheduled meds, most importantly his bp/heart failure meds,  teaching provided, patient continue to refuse meds. Provider made aware via secure chat.  Problem: Fluid Volume: Goal: Hemodynamic stability will improve Outcome: Progressing   Problem: Clinical Measurements: Goal: Diagnostic test results will improve Outcome: Progressing Goal: Signs and symptoms of infection will decrease Outcome: Progressing   Problem: Education: Goal: Knowledge of General Education information will improve Description: Including pain rating scale, medication(s)/side effects and non-pharmacologic comfort measures Outcome: Progressing   Problem: Clinical Measurements: Goal: Will remain free from infection Outcome: Progressing   Problem: Elimination: Goal: Will not experience complications related to bowel motility Outcome: Progressing   Problem: Pain Management: Goal: General experience of comfort will improve Outcome: Progressing   Problem: Elimination: Goal: Will not experience complications related to bowel motility Outcome: Progressing Goal: Will not experience complications related to urinary retention Outcome: Progressing

## 2023-05-14 NOTE — Progress Notes (Addendum)
 Physical Therapy Treatment Patient Details Name: Alan Tapia. MRN: [id number] DOB: 1976-03-06 Today's Date: 05/14/2023   History of Present Illness The pt is a 48 yo male presenting 11/25 with nausea, vomiting, and fever as well as R foot wound. 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    PT Comments  Pt motivated to participate, however, is progressing slowly towards his mobility goals. Trialed slide board transfer to bariatric wheelchair with two person assist. Once up in wheelchair, pt began leaning back heavily, causing his hips to slide forward. +3 assist to reposition hips and rotated wheelchair flush with bed to perform AP transfer back for safety. Provided written HEP for upper and lower body strengthening at bed level. Will continue to progress as tolerated.   If plan is discharge home, recommend the following: Two people to help with walking and/or transfers;Assist for transportation;Assistance with cooking/housework;Help with stairs or ramp for entrance   Can travel by private vehicle     No  Equipment Recommendations  Hoyer lift;Wheelchair cushion (measurements PT);Wheelchair (measurements PT);Hospital bed    Recommendations for Other Services       Precautions / Restrictions Precautions Precautions: Fall Restrictions Weight Bearing Restrictions Per Provider Order: Yes RLE Weight Bearing Per Provider Order: Non weight bearing Other Position/Activity Restrictions: bilateral BKA (L chronic, R new)     Mobility  Bed Mobility Overal bed mobility: Needs Assistance Bed Mobility: Supine to Sit Rolling: Min assist, Used rails   Supine to sit: Min assist, HOB elevated, +2 for physical assistance, +2 for safety/equipment Sit to supine: Mod assist   General bed mobility comments: Min A x 2 with HOB elevated  and footboard moved in and handheld assist to lift trunk to grab onto footboard. With difficulty transferring to w/c and back to bed- multiple cues needed to avoid leaning back to prevent sliding. pt ultimately reaching for wife's hand pulled self forward and rolled back into the bed    Transfers Overall transfer level: Needs assistance Equipment used: Sliding board Transfers: Bed to chair/wheelchair/BSC            Lateral/Scoot Transfers: Min assist, +2 physical assistance, +2 safety/equipment, Max assist, With slide board General transfer comment: Initially Min A x2 to initially get onto sliding board but with pt tendency to lean back during transfer, pt sliding forward and required Max x 3 to scoot back into wheelchair safely and reposition at bed    Ambulation/Gait                   Stairs             Wheelchair Mobility     Tilt Bed    Modified Rankin (Stroke Patients Only)       Balance Overall balance assessment: Needs assistance Sitting-balance support: Feet unsupported, No upper extremity supported Sitting balance-Leahy Scale: Fair                                      Cognition Arousal: Alert Behavior During Therapy: WFL for tasks assessed/performed Overall Cognitive Status: Within Functional Limits for tasks assessed  Exercises Other Exercises Other Exercises: long sitting with B bedrails x 3    General Comments General comments (skin integrity, edema, etc.): Wife present and hands on to assist      Pertinent Vitals/Pain Pain Assessment Pain Assessment: Faces Faces Pain Scale: Hurts even more Pain Location: pt inconsistently localizes his pain (lower back, perineum, L hip?) Pain Descriptors / Indicators: Discomfort, Grimacing, Guarding, Moaning, Cramping, Spasm Pain Intervention(s): Limited activity within patient's tolerance, Monitored during session, Patient  requesting pain meds-RN notified    Home Living                          Prior Function            PT Goals (current goals can now be found in the care plan section) Acute Rehab PT Goals Time For Goal Achievement: 05/28/23 Potential to Achieve Goals: Fair    Frequency    Min 1X/week      PT Plan      Co-evaluation PT/OT/SLP Co-Evaluation/Treatment: Yes Reason for Co-Treatment: Complexity of the patient's impairments (multi-system involvement);Necessary to address cognition/behavior during functional activity;For patient/therapist safety PT goals addressed during session: Mobility/safety with mobility;Balance        AM-PAC PT 6 Clicks Mobility   Outcome Measure  Help needed turning from your back to your side while in a flat bed without using bedrails?: A Little Help needed moving from lying on your back to sitting on the side of a flat bed without using bedrails?: A Little Help needed moving to and from a bed to a chair (including a wheelchair)?: Total Help needed standing up from a chair using your arms (e.g., wheelchair or bedside chair)?: Total Help needed to walk in hospital room?: Total Help needed climbing 3-5 steps with a railing? : Total 6 Click Score: 10    End of Session Equipment Utilized During Treatment: Gait belt Activity Tolerance: Patient tolerated treatment well Patient left: in bed;with call bell/phone within reach;with bed alarm set Nurse Communication: Patient requests pain meds;Mobility status PT Visit Diagnosis: Other abnormalities of gait and mobility (R26.89);Muscle weakness (generalized) (M62.81);Pain Pain - Right/Left: Right Pain - part of body: Leg     Time: 1327-1410 PT Time Calculation (min) (ACUTE ONLY): 43 min  Charges:    $Therapeutic Activity: 23-37 mins PT General Charges $$ ACUTE PT VISIT: 1 Visit                     [name], PT, DPT Acute Rehabilitation Services Office [phone]    [name] 05/14/2023, 3:59 PM

## 2023-05-14 NOTE — Progress Notes (Signed)
 Occupational Therapy Treatment Patient Details Name: Alan Tapia. MRN: [id number] DOB: June 10, 1975 Today's Date: 05/14/2023   History of present illness The pt is a 48 yo male presenting 11/25 with nausea, vomiting, and fever as well as R foot wound. 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   OT comments  Pt making gradual progress toward ADL goals, able to assist with peri care bed level during session. However, pt with slower progress towards mobility/transfer goals. Trialed sliding board to bariatric wheelchair with pt initially doing well but once at wheelchair surface, pt began sliding forward and leaning back despite cues to stay upright. Pt ultimately required Max A x 3 to safely scoot up in wheelchair and reposition for safe transfer back to bed. Plan to further address AP transfers and use of maxislide with movement as spouse reports they have a maxislide at home.       If plan is discharge home, recommend the following:  Two people to help with walking and/or transfers;Two people to help with bathing/dressing/bathroom;Assistance with cooking/housework;Assist for transportation;Help with stairs or ramp for entrance   Equipment Recommendations  Hoyer lift    Recommendations for Other Services      Precautions / Restrictions Precautions Precautions: Fall Restrictions Weight Bearing Restrictions Per Provider Order: Yes RLE Weight Bearing Per Provider Order: Non weight bearing Other Position/Activity Restrictions: bilateral BKA (L chronic, R new)       Mobility Bed Mobility Overal bed mobility: Needs Assistance Bed Mobility: Supine to Sit Rolling: Min assist, Used rails   Supine to sit: Min assist, HOB elevated, +2 for physical assistance, +2 for safety/equipment Sit to supine: Mod assist   General bed  mobility comments: Min A x 2 with HOB elevated and footboard moved in and handheld assist to lift trunk to grab onto footboard. With difficulty transferring to w/c and back to bed- multiple cues needed to avoid leaning back to prevent sliding. pt ultimately reaching for wife's hand pulled self forward and rolled back into the bed    Transfers Overall transfer level: Needs assistance Equipment used: Sliding board Transfers: Bed to chair/wheelchair/BSC            Lateral/Scoot Transfers: Min assist, +2 physical assistance, +2 safety/equipment, Max assist, With slide board General transfer comment: Initially Min A x2 to initially get onto sliding board but with pt tendency to lean back during transfer, pt sliding forward and required Max x 3 to scoot back into wheelchair safely and reposition at bed     Balance Overall balance assessment: Needs assistance Sitting-balance support: Feet unsupported, No upper extremity supported Sitting balance-Leahy Scale: Fair                                     ADL either performed or assessed with clinical judgement   ADL Overall ADL's : Needs assistance/impaired             Lower Body Bathing: Moderate assistance;Bed level Lower Body Bathing Details (indicate cue type and reason): able to complete peri care rolling side to side in bed     Lower Body Dressing: Maximal assistance;Bed level Lower Body Dressing Details (indicate cue type and reason): wife present and assisting pt bed level to don pants; cued for  rolling side to side. pt able to reach down to adjust pants leg               General ADL Comments: Focus on trial of transfer to w/c with sliding board    Extremity/Trunk Assessment Upper Extremity Assessment Upper Extremity Assessment: Overall WFL for tasks assessed;Right hand dominant   Lower Extremity Assessment Lower Extremity Assessment: Defer to PT evaluation        Vision   Vision Assessment?: No  apparent visual deficits   Perception     Praxis      Cognition Arousal: Alert Behavior During Therapy: WFL for tasks assessed/performed Overall Cognitive Status: Within Functional Limits for tasks assessed                                          Exercises Exercises: Other exercises Other Exercises Other Exercises: long sitting with B bedrails x 3    Shoulder Instructions       General Comments Wife present and hands on to assist    Pertinent Vitals/ Pain       Pain Assessment Pain Assessment: Faces Faces Pain Scale: Hurts even more Pain Location: pt inconsistently localizes his pain (lower back, perineum, L hip?) Pain Descriptors / Indicators: Discomfort, Grimacing, Guarding, Moaning, Cramping, Spasm Pain Intervention(s): Monitored during session, Patient requesting pain meds-RN notified, RN gave pain meds during session  Home Living                                          Prior Functioning/Environment              Frequency  Min 1X/week        Progress Toward Goals  OT Goals(current goals can now be found in the care plan section)  Progress towards OT goals: Progressing toward goals  Acute Rehab OT Goals Patient Stated Goal: go to rehab, be able to get prosthetics OT Goal Formulation: With patient Time For Goal Achievement: 05/21/23 Potential to Achieve Goals: Good  Plan      Co-evaluation                 AM-PAC OT 6 Clicks Daily Activity     Outcome Measure   Help from another person eating meals?: A Little Help from another person taking care of personal grooming?: A Little Help from another person toileting, which includes using toliet, bedpan, or urinal?: Total Help from another person bathing (including washing, rinsing, drying)?: A Lot Help from another person to put on and taking off regular upper body clothing?: A Little Help from another person to put on and taking off regular lower body  clothing?: Total 6 Click Score: 13    End of Session Equipment Utilized During Treatment: Gait belt;Other (comment) (sliding board)  OT Visit Diagnosis: Other abnormalities of gait and mobility (R26.89);Muscle weakness (generalized) (M62.81);Other (comment)   Activity Tolerance Patient tolerated treatment well   Patient Left in bed;with call bell/phone within reach;with nursing/sitter in room;with family/visitor present   Nurse Communication Mobility status        Time: 1327-1410 OT Time Calculation (min): 43 min  Charges: OT General Charges $OT Visit: 1 Visit OT Treatments $Therapeutic Activity: 8-22 mins  Mliss NOVAK, OTR/L Acute Rehab Services Office: [phone] Mliss NOVAK, OTR/L Acute  Rehab Services Office: [phone]   Mliss Getting 05/14/2023, 2:38 PM

## 2023-05-14 NOTE — Progress Notes (Addendum)
 PROGRESS NOTE    Alan Tapia.  FMW:[id number] DOB: 1975/08/21 DOA: 04/06/2023 PCP: Patient, No Pcp Per  Subjective: Pt seen and examined. [name] at bedside. Discussed that I am titrating up his CHF meds. Pt denies refusing to have labs obtained.  Attempted to de-escalate his agitation. Pt goes on tangential comments about how [facility] did this and [facility] did that.  Attempted to steer patient back to issues at hand which are allowing lab to drawn blood no matter what time it is.  Pt has refused 3 doses of lopressor  thus far(evening dose 05-12-23, both doses 05-13-2023) Also refused both doses of Entresto  yesterday.   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 He was also having nausea, vomiting and diarrhea. Orthopedic surgery was consulted and patient underwent right BKA 11/27 by Dr. Harden.  HPI: Alan Tapia. is a 48 y.o. male with medical history significant of right lower extremity below-knee amputation, dilated cardiomyopathy, combined systolic and diastolic heart failure with reduced EF 35 to 40%, essential hypertension, adjustment disorder, phantom phenomenon pain, insulin -dependent DM type II, peripheral neuropathy, hyperlipidemia and insomnia presented emergency departments for evaluation for malodorous wound of his left foot.  Patient reported he has significant diabetic foot infection.   During my evaluation at the bedside patient reported that he has been admitted in the [facility] in August 2024 when he had left-sided foot infection and since he has been discharged from the hospital unfortunately he lost  follow-up with primary care doctor as well as lost follow-up with wound care clinic as well.  He has not has got any refill of any blood pressure regimen and insulin  for last 4 months.   Patient reporting that he has ongoing nausea, vomiting and diarrhea for last 1 month.  Also patient noticed right-sided heel wound with malodorous discharge for last 2 weeks.  Patient denies any foot pain.  Denies any fever and chills at home.  Denies any abdominal pain. Patient denies chest pain, orthopnea, PND, dyspnea, headache, change of appetite retroportal and constipation.   Antibiotic Therapy: Anti-infectives (From admission, onward)    Start     Dose/Rate Route Frequency Ordered Stop   04/30/23 1800  nirmatrelvir /ritonavir  (PAXLOVID ) 3 tablet        3 tablet Oral 2 times daily 04/30/23 1621 05/05/23 0820   04/09/23 1345  doxycycline  (VIBRAMYCIN ) 100 mg in dextrose  5 % 250 mL IVPB        100 mg 125 mL/hr over 120 Minutes Intravenous Every 12 hours 04/09/23 1252 04/09/23 1746   04/08/23 1300  levofloxacin  (LEVAQUIN ) IVPB 500 mg  Status:  Discontinued        500 mg 100 mL/hr over 60 Minutes Intravenous On call to O.R. 04/08/23 1229 04/08/23 1648   04/08/23 1300  vancomycin  (VANCOREADY) IVPB 1500 mg/300 mL        1,500 mg 100 mL/hr over 180 Minutes Intravenous On call to O.R. 04/08/23 1229 04/08/23 1433   04/08/23 1237  vancomycin  HCl (VANCOREADY) 1500 MG/300ML IVPB       Note to Pharmacy: Barron Friday D: cabinet override      04/08/23 1237 04/08/23 1439   04/07/23 0800  piperacillin -tazobactam (ZOSYN ) IVPB 3.375 g  3.375 g 12.5 mL/hr over 240 Minutes Intravenous Every 8 hours 04/07/23 0108 04/09/23 2108   04/07/23 0100  doxycycline  (VIBRAMYCIN ) 100 mg in dextrose  5 % 250 mL IVPB  Status:  Discontinued        100 mg 125 mL/hr over 120 Minutes Intravenous Every 12 hours 04/07/23 0057 04/09/23 1252   04/07/23 0000  ceFEPIme  (MAXIPIME ) 2 g in sodium chloride  0.9 % 100 mL IVPB  Status:   Discontinued        2 g 200 mL/hr over 30 Minutes Intravenous Every 8 hours 04/06/23 2349 04/07/23 0108   04/06/23 2330  metroNIDAZOLE  (FLAGYL ) IVPB 500 mg        500 mg 100 mL/hr over 60 Minutes Intravenous  Once 04/06/23 2321 04/07/23 0032   04/06/23 2300  piperacillin -tazobactam (ZOSYN ) IVPB 3.375 g  Status:  Discontinued        3.375 g 100 mL/hr over 30 Minutes Intravenous  Once 04/06/23 2257 04/06/23 2320   04/06/23 2300  vancomycin  (VANCOREADY) IVPB 2000 mg/400 mL  Status:  Discontinued        2,000 mg 200 mL/hr over 120 Minutes Intravenous  Once 04/06/23 2257 04/06/23 2312       Procedures: 04-08-2023 Right BKA  Consultants: [name]    Assessment and Plan: * Sepsis (HCC) 05-11-2023 resolved after treatment and amputation of right foot.  S/P BKA (below knee amputation), right [facility]) - 04-08-2023 05-13-2023 pt has stump shrinker on right LE stump 05-14-2023 has stump shrinkers on both LE stumps  CKD (chronic kidney disease), stage II 05-11-2023 Scr 1.03 today. 05-12-2023 check BMP tomorrow since he has started Entresto  yesterday. 05-14-2023 Scr 0.95 yesterday  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 05-11-2023 chronic. stable. Start low dose entresto . Scr 1.02. euvolemic. Doesn't need scheduled lasix . 05-12-2023 started on low dose entresto  24-26 bid. Has had 3 doses so far.  Will check BMP in AM.  05-13-2023 awaiting this AM labs. May be able to increase entresto  dose. 05-14-2023 Pt has refused 3 doses of lopressor  thus far(evening dose 05-12-23, both doses 05-13-2023). Also refused both doses of Entresto  yesterday. Do not change any doses until pt has taken current doses with some regularity.  Essential hypertension 05-11-2023 increase lopressor  to 75 mg bid due to uncontrolled BP. Currently on 50 mg bid. 05-12-2023 pt has only had 2 doses of 75 mg lopressor . Will monitor today and adjust. HR 77. He has room to  increase lopressor  dose.  05-13-2023 he refused last night's dose of lopressor . HR 101 this AM. May need to change to Toprol -XL to ensure better compliance 05-14-2023 Pt has refused 3 doses of lopressor  thus far(evening dose 05-12-23, both doses 05-13-2023). Do not change lopressor  dose until pt has been taking medication with some regularity.  Diarrhea-resolved as of 05/11/2023 Resolved.  CAP (community acquired pneumonia)-resolved as of 05/11/2023 Treated and resolved.  Metabolic acidosis-resolved as of 05/11/2023 Resolved.  Acute on chronic anemia 05-11-2023 iron  level is somewhat low. Will start nu-iron  150 mg daily. Iron /TIBC/Ferritin/ %Sat    Component Value Date/Time   IRON  34 (L) 04/10/2023 1338   TIBC NOT CALCULATED 04/10/2023 1338   IRONPCTSAT NOT CALCULATED 04/10/2023 1338   05-12-2023 continue nu-iron  150 mg daily.  Acute osteomyelitis of right foot (HCC) 05-11-2023 resolved after right BKA on 04-08-2023.  COVID-19 virus infection 05-11-2023 messaged with infection control RN. Pt's covid test was positive on 04-30-2023. Today is 12 days from PCR. Pt can  come out of droplet isolation. 05-12-2023 still has a slight cough. Advised pt and wife that this will get better over time.  Malnutrition of moderate degree 05-11-2023 seen RD noted from 05-07-2023. Moderate Malnutrition related to chronic illness as evidenced by energy intake < or equal to 75% for > or equal to 1 month, moderate fat depletion, moderate muscle depletion.   Cutaneous abscess of right foot 05-11-2023 resolved after right BKA on 04-08-2023.  Subacute osteomyelitis of right foot (HCC) 05-11-2023 resolved after right BKA on 04-08-2023.  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  Hx of left BKA (HCC) 05-11-2023 chronic. 05-13-2023 has stump shrinker on left LE stump 05-14-2023 has stump shrinkers on both LE stumps  Insulin  dependent type 2 diabetes mellitus  (HCC) 05-12-2023 continue with SSI. CBG in adequate ranges.  Obesity, Class II, BMI 35-39.9 05-11-2023 BMI 37.03  05-13-2023 pt has lost 28 lbs since admission. Admit weight of 320 lbs. Current weight of 292.11 lbs.    DVT prophylaxis: SQ lovenox   Has bilateral LE BKA    Code Status: Full Code Family Communication: discussed with pt and wife at bedside Disposition Plan: SNF Reason for continuing need for hospitalization: medically stable for DC  Objective: Vitals:   05/13/23 0808 05/13/23 2100 05/14/23 0443 05/14/23 0834  BP: (!) 161/81 (!) 162/84 (!) 163/89 (!) 153/84  Pulse: (!) 102 (!) 104 96 86  Resp: 16 18 19 20   Temp: 98.5 F (36.9 C) 98.4 F (36.9 C) 98.5 F (36.9 C) 98.5 F (36.9 C)  TempSrc: Oral Oral Oral   SpO2: 93% 100% 100% 96%  Weight:   135.5 kg   Height:        Intake/Output Summary (Last 24 hours) at 05/14/2023 1124 Last data filed at 05/14/2023 0900 Gross per 24 hour  Intake 480 ml  Output 2000 ml  Net -1520 ml   Filed Weights   05/12/23 0500 05/13/23 0331 05/14/23 0443  Weight: 134.4 kg 132.5 kg 135.5 kg    Examination:  Physical Exam Vitals and nursing note reviewed.  Constitutional:      Appearance: He is obese.  HENT:     Head: Normocephalic and atraumatic.     Nose: Nose normal.  Cardiovascular:     Rate and Rhythm: Normal rate and regular rhythm.  Pulmonary:     Effort: Pulmonary effort is normal.     Breath sounds: Normal breath sounds.  Abdominal:     General: Bowel sounds are normal.     Palpations: Abdomen is soft.  Musculoskeletal:     Comments: Bilateral BKA Bilateral stump shrinkers on  Skin:    General: Skin is warm and dry.     Capillary Refill: Capillary refill takes less than 2 seconds.  Neurological:     Mental Status: He is alert and oriented to person, place, and time.  Psychiatric:        Mood and Affect: Affect is angry.        Behavior: Behavior is agitated.     Data Reviewed: I have personally  reviewed following labs and imaging studies  CBC: Recent Labs  Lab 05/08/23 0529 05/10/23 0518  WBC 8.9 6.3  NEUTROABS 6.1 3.7  HGB 9.0* 7.9*  HCT 30.7* 26.8*  MCV 86.7 86.5  PLT 494* 284   Basic Metabolic Panel: Recent Labs  Lab 05/08/23 0529 05/10/23 0518 05/11/23 0616 05/13/23 0818  NA 139 135 140 139  K 3.8 4.1 3.8 3.8  CL  107 106 107 108  CO2 27 20* 25 27  GLUCOSE 99 91 87 78  BUN 8 9 8 7   CREATININE 1.04 1.00 1.02 0.95  CALCIUM  7.8* 7.5* 7.9* 7.8*   GFR: Estimated Creatinine Clearance: 144.5 mL/min (by C-G formula based on SCr of 0.95 mg/dL). Liver Function Tests: Recent Labs  Lab 05/13/23 0818  AST 14*  ALT 14  ALKPHOS 229*  BILITOT 0.2  PROT 5.7*  ALBUMIN  <1.5*   BNP (last 3 results) Recent Labs    12/14/22 0839  BNP 197.3*   CBG: Recent Labs  Lab 05/13/23 0828 05/13/23 1148 05/13/23 1704 05/13/23 2146 05/14/23 0833  GLUCAP 75 85 76 84 80   Scheduled Meds:  (feeding supplement) PROSource Plus  30 mL Oral BID BM   docusate sodium   100 mg Oral BID   enoxaparin  (LOVENOX ) injection  70 mg Subcutaneous Daily   feeding supplement  237 mL Oral BID BM   folic acid   1 mg Oral Daily   Gerhardt's butt cream   Topical BID   insulin  aspart  0-9 Units Subcutaneous TID WC   iron  polysaccharides  150 mg Oral Daily   lidocaine   2 patch Transdermal Q24H   metoprolol  tartrate  75 mg Oral BID   multivitamin with minerals  1 tablet Oral Daily   pantoprazole   40 mg Oral BID   saccharomyces boulardii  250 mg Oral BID   sacubitril -valsartan   1 tablet Oral BID   sodium chloride  flush  10 mL Intravenous Q12H   sodium chloride  flush  3 mL Intravenous Q12H   Continuous Infusions:   LOS: 37 days   Time spent: 40 minutes  Camellia Door, DO  [facility]  05/14/2023, 11:24 AM

## 2023-05-14 NOTE — Progress Notes (Addendum)
 Nutrition Follow-up  DOCUMENTATION CODES:   Non-severe (moderate) malnutrition in context of chronic illness, Obesity unspecified  INTERVENTION:   - Continue low sodium regular diet with double proteins and snacks daily  - Mighty Shake TID with meals, each supplement provides 330 kcals and 9 grams of protein - Ensure Enlive po BID, each supplement provides 350 kcal and 20 grams of protein. - snacks daily  - Continue MVI with minerals and folic acid  daily  - Discontinue ProSoruce due to making pt sick - Add juven BID, each packet provides 100 kcal, and 2.5 gm protein  NUTRITION DIAGNOSIS:   Moderate Malnutrition related to chronic illness as evidenced by energy intake < or equal to 75% for > or equal to 1 month, moderate fat depletion, moderate muscle depletion.  - Still applicable   GOAL:   Patient will meet greater than or equal to 90% of their needs  - Ongoing   MONITOR:   PO intake, Supplement acceptance, Weight trends  REASON FOR ASSESSMENT:   Consult Assessment of nutrition requirement/status, Calorie Count  ASSESSMENT:   48 y.o. M presented  from home with N/V/D x 2 weeks and right foot wound, admitted with sepsis. PMH; type 2 diabetes, CHF, BKA of left lower extremity and ongoing diabetic foot infection, s/p RBKA.  4/5 - LBKA 11/27- RBKA  Pt having fluctuating appetite and PO intake Consuming 81% of meals and is drinking his Ensures. Of note pt receiving double proteins with meals. Pt reports he has not been eating much due to issues with nutrition services. He states his food has been coming cold, not coming at all, or is not what he ordered. He resorts to getting take out provided by his wife when this happens. Noted 90% breakfast tray consumed on table this morning. Does not like ProSource because it makes him sick. Willing to try Juven.   Yesterday he reports having most of his breakfast, 50% of his Lunch and only 10% of his dinner because he had a vomiting  episode after taking his meds all at once. States he was not provided with any Ensures yesterday, spoke to RN. Did eat all of his snacks.   Pt reports he does not want to take his morning medications because they will make them sick on an empty stomach, spoke to RN.   Pt has lost 22 lbs since admission however most of this is from his RBKA procedure this admission. Discussed with pt the importance of protein with wound healing and overall health. Encouraged pt to drink his supplements and order a protein with every meal.   Admit weight: 144.8 kg  Current weight: 135.5 kg  +1 edema RLE, LLE  Average Meal Intake: 12/24-01/25: 81% intake x 8 recorded meals  Nutritionally Relevant Medications: Scheduled Meds:  docusate sodium   100 mg Oral BID   feeding supplement  237 mL Oral BID BM   folic acid   1 mg Oral Daily   insulin  aspart  0-9 Units Subcutaneous TID WC   iron  polysaccharides  150 mg Oral Daily   lidocaine   2 patch Transdermal Q24H   metoprolol  tartrate  75 mg Oral BID   multivitamin with minerals  1 tablet Oral Daily   Labs Reviewed: Calcium  7.8 CBG ranges from 75-85 mg/dL over the last 24 hours HgbA1c 6.8 (03/2023)  Diet Order:   Diet Order             Diet regular Room service appropriate? Yes; Fluid consistency: Thin  Diet effective  now           Diet - low sodium heart healthy                   EDUCATION NEEDS:   Education needs have been addressed  Skin:  Skin Assessment: Reviewed RN Assessment Skin Integrity Issues:: Stage II, Incisions, Wound VAC Stage II: [place] Wound Vac: R knee Incisions: R knee  Last BM:  05/2023, type 6  Height:   Ht Readings from Last 1 Encounters:  04/08/23 6' 4 (1.93 m)    Weight:   Wt Readings from Last 1 Encounters:  05/14/23 135.5 kg    Ideal Body Weight:  79.16 kg  BMI:  Body mass index is 36.36 kg/m.  Estimated Nutritional Needs:   Kcal:  2450- 2800 kcal/d  Protein:  140-160 gm  Fluid:   >2.2L   Olivia Kenning, RD Registered Dietitian  See Amion for more information

## 2023-05-14 NOTE — TOC Progression Note (Addendum)
 Transition of Care (TOC) - Progression Note    Patient Details  Name: Alan Tapia. MRN: [id number] Date of Birth: 1975/11/17  Transition of Care [facility]) CM/SW Contact  Harlene Sharps, LCSW Phone Number: 05/14/2023, 11:09 AM  Clinical Narrative:    Per conversations with Mr. Bernstein last week, CSW attempted to speak with Viki at [facility] about disposition and updates on disability for placement. VM left. Will also attempt to set up a family meeting due to many conflicting stories. TOC will continue to follow.  1:00 CSW spoke with Viki who is in agreement with a family meeting as she has also been experiencing concerns with pt and family. Per Viki, she states she is concerned pt is only allowing spouse to continue being with him since she brings him things. She also notes concerns about pt not being truthful about reports made for home situation. TOC will continue to follow.   Expected Discharge Plan: Skilled Nursing Facility Barriers to Discharge: Insurance Authorization, Continued Medical Work up, SNF Pending bed offer  Expected Discharge Plan and Services In-house Referral: Clinical Social Work     Living arrangements for the past 2 months: Single Family Home Expected Discharge Date: 04/11/23                                     Social Determinants of Health (SDOH) Interventions SDOH Screenings   Food Insecurity: No Food Insecurity (04/07/2023)  Housing: Unknown (04/23/2023)  Recent Concern: Housing - Medium Risk (04/07/2023)  Transportation Needs: No Transportation Needs (04/07/2023)  Recent Concern: Transportation Needs - Unmet Transportation Needs (02/17/2023)   Received from [facility]  Utilities: At Risk (04/07/2023)  Alcohol  Screen: Low Risk  (08/20/2022)  Financial Resource Strain: Patient Unable To Answer (08/20/2022)  Tobacco Use: Low Risk  (04/08/2023)    Readmission Risk Interventions    04/07/2023    2:34 PM  Readmission Risk Prevention Plan   Transportation Screening Complete  PCP or Specialist Appt within 3-5 Days Complete  HRI or Home Care Consult Complete  Social Work Consult for Recovery Care Planning/Counseling Complete  Palliative Care Screening Not Applicable  Medication Review Oceanographer) Complete

## 2023-05-14 NOTE — TOC Progression Note (Addendum)
 Transition of Care (TOC) - Progression Note    Patient Details  Name: Alan Tapia. MRN: [id number] Date of Birth: Oct 06, 1975  Transition of Care [facility]) CM/SW Contact  Corean JAYSON Canary, RN Phone Number: 05/14/2023, 10:36 AM  Clinical Narrative:    Received a call from CM Josetta  at [facility], the plan expired 05/13/2023, they can re-enroll, Josetta has already spoken to Ms Bryan and MS Wilmeth can contact her for assistance. Attempted to call MS Fulwider, unable to speak to her at this time.   Expected Discharge Plan: Skilled Nursing Facility Barriers to Discharge: Insurance Authorization, Continued Medical Work up, SNF Pending bed offer  Expected Discharge Plan and Services In-house Referral: Clinical Social Work     Living arrangements for the past 2 months: Single Family Home Expected Discharge Date: 04/11/23                                     Social Determinants of Health (SDOH) Interventions SDOH Screenings   Food Insecurity: No Food Insecurity (04/07/2023)  Housing: Unknown (04/23/2023)  Recent Concern: Housing - Medium Risk (04/07/2023)  Transportation Needs: No Transportation Needs (04/07/2023)  Recent Concern: Transportation Needs - Unmet Transportation Needs (02/17/2023)   Received from [facility]  Utilities: At Risk (04/07/2023)  Alcohol  Screen: Low Risk  (08/20/2022)  Financial Resource Strain: Patient Unable To Answer (08/20/2022)  Tobacco Use: Low Risk  (04/08/2023)    Readmission Risk Interventions    04/07/2023    2:34 PM  Readmission Risk Prevention Plan  Transportation Screening Complete  PCP or Specialist Appt within 3-5 Days Complete  HRI or Home Care Consult Complete  Social Work Consult for Recovery Care Planning/Counseling Complete  Palliative Care Screening Not Applicable  Medication Review Oceanographer) Complete

## 2023-05-14 NOTE — Telephone Encounter (Signed)
 [name] [name] called requesting a script for prostatic left leg for pt to start therapy with it. Please call pt's wife when sent so she can know to whom and where is was sent. Pt phone number is [phone].

## 2023-05-15 ENCOUNTER — Inpatient Hospital Stay (HOSPITAL_COMMUNITY): Payer: Medicaid Other

## 2023-05-15 DIAGNOSIS — I5042 Chronic combined systolic (congestive) and diastolic (congestive) heart failure: Secondary | ICD-10-CM

## 2023-05-15 DIAGNOSIS — I10 Essential (primary) hypertension: Secondary | ICD-10-CM | POA: Diagnosis not present

## 2023-05-15 DIAGNOSIS — E66812 Obesity, class 2: Secondary | ICD-10-CM | POA: Diagnosis not present

## 2023-05-15 LAB — GLUCOSE, CAPILLARY
Glucose-Capillary: 81 mg/dL (ref 70–99)
Glucose-Capillary: 96 mg/dL (ref 70–99)
Glucose-Capillary: 116 mg/dL — ABNORMAL HIGH (ref 70–99)
Glucose-Capillary: 140 mg/dL — ABNORMAL HIGH (ref 70–99)

## 2023-05-15 LAB — ECHOCARDIOGRAM COMPLETE
Area-P 1/2: 4.71 cm2
Calc EF: 32 %
Est EF: 30
Height: 76 in
S' Lateral: 4.4 cm
Single Plane A2C EF: 30.4 %
Single Plane A4C EF: 32 %
Weight: 4779.57 [oz_av]

## 2023-05-15 MED ORDER — NALOXEGOL OXALATE 25 MG PO TABS
25.0000 mg | ORAL_TABLET | Freq: Every day | ORAL | Status: DC
  Administered 2023-05-15: 25 mg via ORAL
  Filled 2023-05-15 (×2): qty 1

## 2023-05-15 NOTE — Telephone Encounter (Signed)
 I tried to call pt's wife x 3 and message states call can not be completed as dialed. The pt is s/p a BKA 04/08/2023 per Dr. Harden will take time to use shrinker and decrease volume in limb before he can be casted fro prosthetic and this usually takes about 3 months for surgical date. Will hold message and try again.

## 2023-05-15 NOTE — Progress Notes (Signed)
 Patient refused his  Metoprolol tartrate 75 mg tablet and Entresto 49-51 mg tablet stated 'I only need my pain medication and my muscle relaxer'. Patient educated  x 3,will continue to monitor.

## 2023-05-15 NOTE — Plan of Care (Signed)
  Problem: Clinical Measurements: Goal: Ability to maintain clinical measurements within normal limits will improve Outcome: Progressing Goal: Will remain free from infection Outcome: Progressing   Problem: Activity: Goal: Risk for activity intolerance will decrease Outcome: Progressing   Problem: Nutrition: Goal: Adequate nutrition will be maintained Outcome: Progressing   Problem: Elimination: Goal: Will not experience complications related to bowel motility Outcome: Progressing Goal: Will not experience complications related to urinary retention Outcome: Progressing   Problem: Pain Management: Goal: General experience of comfort will improve Outcome: Progressing   Problem: Safety: Goal: Ability to remain free from injury will improve Outcome: Progressing   Problem: Skin Integrity: Goal: Risk for impaired skin integrity will decrease Outcome: Progressing

## 2023-05-15 NOTE — Progress Notes (Signed)
   05/15/23 1057  Spiritual Encounters  Type of Visit Initial  Care provided to: Patient  Conversation partners present during encounter Nurse  Referral source Nurse (RN/NT/LPN)  Reason for visit Urgent spiritual support  OnCall Visit No   Chaplain's Reason for Visit: Chaplain was called by RN saying that Pt was looking to speak with a chaplain. 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  Also proves hope. (Needs Support, Supported, Well Supported & Strong ) Spiritual: Well Supported & Strong Emotional: Supported Relational: Needs Support  Additional Assessment:  Pt called chaplain to help him process anemotionally charged situation that occurred in his room this morning where there was a communication breakdown that occurred with the physician and the [name] present.  The Pt is frustrated as he feels that he is not being heard/listened to by the medical staff nor his wife. Chaplain listened and simply held the Pt's feelings and assured him that he is worthy hearing. Chaplain was able to use Theological exploration with  the Pt to help process and defuse the Pt's hurt and frustration.  Chaplaincy Plan: Chaplain is satisfied that the issue the Pt. sought help with has been resolved.  The Pt has been instructed that if he desires to speak with a chaplain again he should reach out.  Chaplain Maude Roll, MDiv [email or web address]

## 2023-05-15 NOTE — Plan of Care (Signed)
  Problem: Clinical Measurements: Goal: Signs and symptoms of infection will decrease Outcome: Progressing   Problem: Clinical Measurements: Goal: Will remain free from infection Outcome: Progressing   Problem: Clinical Measurements: Goal: Cardiovascular complication will be avoided Outcome: Progressing   Problem: Nutrition: Goal: Adequate nutrition will be maintained Outcome: Progressing   Problem: Coping: Goal: Level of anxiety will decrease Outcome: Progressing   Problem: Pain Management: Goal: General experience of comfort will improve Outcome: Progressing   Problem: Skin Integrity: Goal: Risk for impaired skin integrity will decrease Outcome: Progressing

## 2023-05-15 NOTE — Progress Notes (Signed)
 PROGRESS NOTE    Alan Tapia.  FMW:[id number] DOB: June 28, 1975 DOA: 04/06/2023 PCP: Patient, No Pcp Per  Subjective: Pt seen and examined. [name] at bedside. Pt is quite belligerent today. Yelling at his wife not to talk. Pt has been refusing lopressor  and entresto  for the last 2.5 days despite our conversation yesterday and him agreeing to take his meds.  Pt acting oppositional and defiant. He again goes on a tangent about how his right leg was discolored. Wife corrects him and tells him that he leg was infected and he was ignoring his symptoms.  It was at this point when pt's wife told him that he had been ignoring his problems that pt told his wife to BE QUIET and demanded that his wife(who has been at his bedside 24 hours a day) leave the room.  I discussed that if he was getting pill fatigue that all unnecessary pills could be stopped. But pt had been complaining about constipation but not taking the colace and laxatives that were prescribed.  I discussed that lopressor  and entresto  do not cause nausea and/or vomiting.  Pt becoming increasingly irate. Wife telling that this writer has only been trying to help him this week.  Pt again demanded that his wife leave the room.  At this point, I told him that I would leave the room and return tomorrow when he could be more civil to me and his wife.  I did not examine him today due to his agitation.   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 He was also having nausea, vomiting and diarrhea. Orthopedic surgery was consulted and patient underwent right BKA 11/27 by Dr. Harden.  HPI: Alan Tapia. is a 48 y.o. male with medical history significant of right lower extremity below-knee amputation, dilated cardiomyopathy, combined systolic and diastolic heart failure with reduced EF 35 to 40%, essential hypertension, adjustment disorder, phantom phenomenon pain, insulin -dependent DM type II, peripheral neuropathy, hyperlipidemia and insomnia presented emergency departments for evaluation for malodorous wound of his left foot.  Patient reported he has significant diabetic foot infection.   During my evaluation at the bedside patient reported that he has been admitted in the [facility] in August 2024 when he had left-sided foot infection and since he has been discharged from the hospital unfortunately he lost follow-up with primary care doctor as well as lost follow-up with wound care clinic as well.  He has not has got any refill of any blood pressure regimen and insulin  for last 4 months.   Patient reporting that he has ongoing nausea, vomiting and diarrhea for last 1 month.  Also patient noticed right-sided heel wound with malodorous discharge for last 2 weeks.  Patient denies any foot pain.  Denies any fever and chills at home.  Denies any abdominal pain. Patient denies chest pain, orthopnea, PND, dyspnea, headache, change of appetite retroportal and constipation.   Antibiotic Therapy: Anti-infectives (From admission, onward)    Start     Dose/Rate Route Frequency Ordered Stop   04/30/23 1800  nirmatrelvir /ritonavir  (PAXLOVID ) 3 tablet        3 tablet Oral 2 times daily 04/30/23 1621 05/05/23 0820   04/09/23 1345  doxycycline  (VIBRAMYCIN ) 100 mg in dextrose   5 % 250 mL IVPB        100 mg 125 mL/hr over 120 Minutes Intravenous Every 12 hours 04/09/23 1252 04/09/23 1746   04/08/23 1300  levofloxacin  (LEVAQUIN ) IVPB 500 mg  Status:  Discontinued        500 mg 100 mL/hr over 60 Minutes Intravenous On call to O.R. 04/08/23 1229 04/08/23 1648   04/08/23 1300  vancomycin  (VANCOREADY)  IVPB 1500 mg/300 mL        1,500 mg 100 mL/hr over 180 Minutes Intravenous On call to O.R. 04/08/23 1229 04/08/23 1433   04/08/23 1237  vancomycin  HCl (VANCOREADY) 1500 MG/300ML IVPB       Note to Pharmacy: Barron Friday D: cabinet override      04/08/23 1237 04/08/23 1439   04/07/23 0800  piperacillin -tazobactam (ZOSYN ) IVPB 3.375 g        3.375 g 12.5 mL/hr over 240 Minutes Intravenous Every 8 hours 04/07/23 0108 04/09/23 2108   04/07/23 0100  doxycycline  (VIBRAMYCIN ) 100 mg in dextrose  5 % 250 mL IVPB  Status:  Discontinued        100 mg 125 mL/hr over 120 Minutes Intravenous Every 12 hours 04/07/23 0057 04/09/23 1252   04/07/23 0000  ceFEPIme  (MAXIPIME ) 2 g in sodium chloride  0.9 % 100 mL IVPB  Status:  Discontinued        2 g 200 mL/hr over 30 Minutes Intravenous Every 8 hours 04/06/23 2349 04/07/23 0108   04/06/23 2330  metroNIDAZOLE  (FLAGYL ) IVPB 500 mg        500 mg 100 mL/hr over 60 Minutes Intravenous  Once 04/06/23 2321 04/07/23 0032   04/06/23 2300  piperacillin -tazobactam (ZOSYN ) IVPB 3.375 g  Status:  Discontinued        3.375 g 100 mL/hr over 30 Minutes Intravenous  Once 04/06/23 2257 04/06/23 2320   04/06/23 2300  vancomycin  (VANCOREADY) IVPB 2000 mg/400 mL  Status:  Discontinued        2,000 mg 200 mL/hr over 120 Minutes Intravenous  Once 04/06/23 2257 04/06/23 2312       Procedures: 04-08-2023 Right BKA  Consultants: [name]    Assessment and Plan: * Sepsis (HCC) 05-11-2023 resolved after treatment and amputation of right foot.  S/P BKA (below knee amputation), right [facility]) - 04-08-2023 05-13-2023 pt has stump shrinker on right LE stump 05-14-2023 has stump shrinkers on both LE stumps  CKD (chronic kidney disease), stage II 05-11-2023 Scr 1.03 today. 05-12-2023 check BMP tomorrow since he has started Entresto  yesterday. 05-14-2023 Scr 0.95 yesterday  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) -  chronic 05-11-2023 chronic. stable. Start low dose entresto . Scr 1.02. euvolemic. Doesn't need scheduled lasix . 05-12-2023 started on low dose entresto  24-26 bid. Has had 3 doses so far.  Will check BMP in AM.  05-13-2023 awaiting this AM labs. May be able to increase entresto  dose. 05-14-2023 Pt has refused 3 doses of lopressor  thus far(evening dose 05-12-23, both doses 05-13-2023). Also refused both doses of Entresto  yesterday. Do not change any doses until pt has taken current doses with some regularity. 05-15-2023 continues to refuse lopressor  and entresto . Make no dosing changes. If pt starts taking entresto  on a regular basis, check BMP to monitor Scr.   Essential hypertension 05-11-2023 increase lopressor  to 75 mg bid due to uncontrolled BP. Currently on 50 mg bid. 05-12-2023 pt has only had 2 doses of 75 mg lopressor . Will monitor today and adjust. HR 77. He  has room to increase lopressor  dose.  05-13-2023 he refused last night's dose of lopressor . HR 101 this AM. May need to change to Toprol -XL to ensure better compliance 05-14-2023 Pt has refused 3 doses of lopressor  thus far(evening dose 05-12-23, both doses 05-13-2023). Do not change lopressor  dose until pt has been taking medication with some regularity. 05-15-2023 continues to refuse lopressor  and entresto . Make no dosing changes. If pt starts taking entresto  on a regular basis, check BMP to monitor Scr.  Diarrhea-resolved as of 05/11/2023 Resolved.  CAP (community acquired pneumonia)-resolved as of 05/11/2023 Treated and resolved.  Metabolic acidosis-resolved as of 05/11/2023 Resolved.  Acute on chronic anemia 05-11-2023 iron  level is somewhat low. Will start nu-iron  150 mg daily. Iron /TIBC/Ferritin/ %Sat    Component Value Date/Time   IRON  34 (L) 04/10/2023 1338   TIBC NOT CALCULATED 04/10/2023 1338   IRONPCTSAT NOT CALCULATED 04/10/2023 1338   05-12-2023 continue nu-iron  150 mg daily.  Acute osteomyelitis of right  foot (HCC) 05-11-2023 resolved after right BKA on 04-08-2023.  COVID-19 virus infection 05-11-2023 messaged with infection control RN. Pt's covid test was positive on 04-30-2023. Today is 12 days from PCR. Pt can come out of droplet isolation. 05-12-2023 still has a slight cough. Advised pt and wife that this will get better over time.  Malnutrition of moderate degree 05-11-2023 seen RD noted from 05-07-2023. Moderate Malnutrition related to chronic illness as evidenced by energy intake < or equal to 75% for > or equal to 1 month, moderate fat depletion, moderate muscle depletion.   Cutaneous abscess of right foot 05-11-2023 resolved after right BKA on 04-08-2023.  Subacute osteomyelitis of right foot (HCC) 05-11-2023 resolved after right BKA on 04-08-2023.  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  Hx of left BKA (HCC) 05-11-2023 chronic. 05-13-2023 has stump shrinker on left LE stump 05-14-2023 has stump shrinkers on both LE stumps  Insulin  dependent type 2 diabetes mellitus (HCC) 05-12-2023 continue with SSI. CBG in adequate ranges.  Obesity, Class II, BMI 35-39.9 05-11-2023 BMI 37.03  05-13-2023 pt has lost 28 lbs since admission. Admit weight of 320 lbs. Current weight of 292.11 lbs.   DVT prophylaxis:   Lovenox    Code Status: Full Code Family Communication: discussed with pt and his wife Disposition Plan: snf Reason for continuing need for hospitalization: medically stable for DC  Objective: Vitals:   05/14/23 1948 05/15/23 0502 05/15/23 0726 05/15/23 0804  BP: (!) 159/83 (!) 150/86  (!) 158/94  Pulse: 95 93 (!) 101 (!) 102  Resp: 17   17  Temp: 98 F (36.7 C) 98.6 F (37 C)  98.1 F (36.7 C)  TempSrc: Oral     SpO2: 96% 98% 100% 100%  Weight:      Height:        Intake/Output Summary (Last 24 hours) at 05/15/2023 1102 Last data filed at 05/14/2023 2124 Gross per 24 hour  Intake 240 ml  Output --  Net 240 ml    Filed Weights   05/12/23 0500 05/13/23 0331 05/14/23 0443  Weight: 134.4 kg 132.5 kg 135.5 kg    Examination:  Physical Exam Vitals and nursing note reviewed.  Psychiatric:     Comments: Pt agitated and yelling. I did not examine him due to his agitation and concern for my own personal safety.     Data Reviewed: I have personally reviewed following labs and imaging studies  CBC: Recent Labs  Lab 05/10/23 0518  WBC 6.3  NEUTROABS  3.7  HGB 7.9*  HCT 26.8*  MCV 86.5  PLT 284   Basic Metabolic Panel: Recent Labs  Lab 05/10/23 0518 05/11/23 0616 05/13/23 0818  NA 135 140 139  K 4.1 3.8 3.8  CL 106 107 108  CO2 20* 25 27  GLUCOSE 91 87 78  BUN 9 8 7   CREATININE 1.00 1.02 0.95  CALCIUM  7.5* 7.9* 7.8*   GFR: Estimated Creatinine Clearance: 144.5 mL/min (by C-G formula based on SCr of 0.95 mg/dL). Liver Function Tests: Recent Labs  Lab 05/13/23 0818  AST 14*  ALT 14  ALKPHOS 229*  BILITOT 0.2  PROT 5.7*  ALBUMIN  <1.5*   BNP (last 3 results) Recent Labs    12/14/22 0839  BNP 197.3*   CBG: Recent Labs  Lab 05/14/23 0833 05/14/23 1135 05/14/23 1604 05/14/23 1946 05/15/23 0803  GLUCAP 80 97 97 87 81   Scheduled Meds:  docusate sodium   100 mg Oral BID   enoxaparin  (LOVENOX ) injection  70 mg Subcutaneous Daily   feeding supplement  237 mL Oral BID BM   Gerhardt's butt cream   Topical BID   insulin  aspart  0-9 Units Subcutaneous TID WC   lidocaine   2 patch Transdermal Q24H   metoprolol  tartrate  75 mg Oral BID   naloxegol  oxalate  25 mg Oral Daily   nutrition supplement (JUVEN)  1 packet Oral BID BM   pantoprazole   40 mg Oral BID   saccharomyces boulardii  250 mg Oral BID   sacubitril -valsartan   1 tablet Oral BID   Continuous Infusions:   LOS: 38 days   Time spent: 40 minutes  Camellia Door, DO  [facility]  05/15/2023, 11:02 AM

## 2023-05-16 DIAGNOSIS — E440 Moderate protein-calorie malnutrition: Secondary | ICD-10-CM | POA: Diagnosis not present

## 2023-05-16 DIAGNOSIS — E119 Type 2 diabetes mellitus without complications: Secondary | ICD-10-CM | POA: Diagnosis not present

## 2023-05-16 DIAGNOSIS — I10 Essential (primary) hypertension: Secondary | ICD-10-CM | POA: Diagnosis not present

## 2023-05-16 DIAGNOSIS — I5042 Chronic combined systolic (congestive) and diastolic (congestive) heart failure: Secondary | ICD-10-CM | POA: Diagnosis not present

## 2023-05-16 LAB — GLUCOSE, CAPILLARY
Glucose-Capillary: 117 mg/dL — ABNORMAL HIGH (ref 70–99)
Glucose-Capillary: 123 mg/dL — ABNORMAL HIGH (ref 70–99)
Glucose-Capillary: 116 mg/dL — ABNORMAL HIGH (ref 70–99)
Glucose-Capillary: 140 mg/dL — ABNORMAL HIGH (ref 70–99)

## 2023-05-16 MED ORDER — CLONIDINE HCL 0.1 MG PO TABS: 0.1000 mg | ORAL_TABLET | ORAL | Status: DC | PRN

## 2023-05-16 NOTE — Plan of Care (Signed)
 Problem: Fluid Volume: Goal: Hemodynamic stability will improve Outcome: Progressing   Problem: Clinical Measurements: Goal: Diagnostic test results will improve Outcome: Progressing Goal: Signs and symptoms of infection will decrease Outcome: Progressing   Problem: Respiratory: Goal: Ability to maintain adequate ventilation will improve Outcome: Progressing   Problem: Education: Goal: Knowledge of General Education information will improve Description: Including pain rating scale, medication(s)/side effects and non-pharmacologic comfort measures Outcome: Progressing   Problem: Health Behavior/Discharge Planning: Goal: Ability to manage health-related needs will improve Outcome: Progressing   Problem: Clinical Measurements: Goal: Ability to maintain clinical measurements within normal limits will improve Outcome: Progressing Goal: Will remain free from infection Outcome: Progressing Goal: Diagnostic test results will improve Outcome: Progressing Goal: Respiratory complications will improve Outcome: Progressing Goal: Cardiovascular complication will be avoided Outcome: Progressing   Problem: Activity: Goal: Risk for activity intolerance will decrease Outcome: Progressing   Problem: Nutrition: Goal: Adequate nutrition will be maintained Outcome: Progressing   Problem: Coping: Goal: Level of anxiety will decrease Outcome: Progressing   Problem: Elimination: Goal: Will not experience complications related to bowel motility Outcome: Progressing Goal: Will not experience complications related to urinary retention Outcome: Progressing   Problem: Pain Management: Goal: General experience of comfort will improve Outcome: Progressing   Problem: Safety: Goal: Ability to remain free from injury will improve Outcome: Progressing   Problem: Skin Integrity: Goal: Risk for impaired skin integrity will decrease Outcome: Progressing   Problem: Activity: Goal: Ability  to tolerate increased activity will improve Outcome: Progressing   Problem: Clinical Measurements: Goal: Ability to maintain a body temperature in the normal range will improve Outcome: Progressing   Problem: Respiratory: Goal: Ability to maintain adequate ventilation will improve Outcome: Progressing Goal: Ability to maintain a clear airway will improve Outcome: Progressing   Problem: Education: Goal: Knowledge of the prescribed therapeutic regimen will improve Outcome: Progressing Goal: Ability to verbalize activity precautions or restrictions will improve Outcome: Progressing Goal: Understanding of discharge needs will improve Outcome: Progressing   Problem: Activity: Goal: Ability to perform//tolerate increased activity and mobilize with assistive devices will improve Outcome: Progressing   Problem: Clinical Measurements: Goal: Postoperative complications will be avoided or minimized Outcome: Progressing   Problem: Self-Care: Goal: Ability to meet self-care needs will improve Outcome: Progressing   Problem: Self-Concept: Goal: Ability to maintain and perform role responsibilities to the fullest extent possible will improve Outcome: Progressing   Problem: Education: Goal: Knowledge of General Education information will improve Description: Including pain rating scale, medication(s)/side effects and non-pharmacologic comfort measures Outcome: Progressing   Problem: Health Behavior/Discharge Planning: Goal: Ability to manage health-related needs will improve Outcome: Progressing   Problem: Clinical Measurements: Goal: Ability to maintain clinical measurements within normal limits will improve Outcome: Progressing Goal: Will remain free from infection Outcome: Progressing Goal: Diagnostic test results will improve Outcome: Progressing Goal: Respiratory complications will improve Outcome: Progressing Goal: Cardiovascular complication will be avoided Outcome:  Progressing   Problem: Activity: Goal: Risk for activity intolerance will decrease Outcome: Progressing   Problem: Nutrition: Goal: Adequate nutrition will be maintained Outcome: Progressing   Problem: Coping: Goal: Level of anxiety will decrease Outcome: Progressing   Problem: Elimination: Goal: Will not experience complications related to bowel motility Outcome: Progressing Goal: Will not experience complications related to urinary retention Outcome: Progressing   Problem: Pain Management: Goal: General experience of comfort will improve Outcome: Progressing   Problem: Safety: Goal: Ability to remain free from injury will improve Outcome: Progressing   Problem: Skin Integrity: Goal: Risk for  impaired skin integrity will decrease Outcome: Progressing

## 2023-05-16 NOTE — Progress Notes (Signed)
 PROGRESS NOTE    Alan Tapia.  FMW:[id number] DOB: 1976-01-13 DOA: 04/06/2023 PCP: Patient, No Pcp Per  Subjective: Pt seen and examined. [name] not at bedside.  Pt seems more sullen today. Definitely not as agitated today. Pt compliant in taking his lopressor  and entresto  yesterday Echo yesterday shows worsening LVEF. Now 30%. 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 He was also having nausea, vomiting and diarrhea. Orthopedic surgery was consulted and patient underwent right BKA 11/27 by Dr. Harden.  HPI: Alan Tapia. is a 48 y.o. male with medical history significant of right lower extremity below-knee amputation, dilated cardiomyopathy, combined systolic and diastolic heart failure with reduced EF 35 to 40%, essential hypertension, adjustment disorder, phantom phenomenon pain, insulin -dependent DM type II, peripheral neuropathy, hyperlipidemia and insomnia presented emergency departments for evaluation for malodorous wound of his left foot.  Patient reported he has significant diabetic foot infection.   During my evaluation at the bedside patient reported that he has been admitted in the [facility] in August 2024 when he had left-sided foot infection and since he has been discharged from the hospital unfortunately he lost follow-up with primary care doctor as well as lost follow-up with wound care clinic as well.  He has not has got any refill of any blood pressure regimen and insulin  for last 4 months.   Patient reporting that he has ongoing nausea, vomiting and diarrhea for last 1 month.  Also patient noticed  right-sided heel wound with malodorous discharge for last 2 weeks.  Patient denies any foot pain.  Denies any fever and chills at home.  Denies any abdominal pain. Patient denies chest pain, orthopnea, PND, dyspnea, headache, change of appetite retroportal and constipation.   Antibiotic Therapy: Anti-infectives (From admission, onward)    Start     Dose/Rate Route Frequency Ordered Stop   04/30/23 1800  nirmatrelvir /ritonavir  (PAXLOVID ) 3 tablet        3 tablet Oral 2 times daily 04/30/23 1621 05/05/23 0820   04/09/23 1345  doxycycline  (VIBRAMYCIN ) 100 mg in dextrose  5 % 250 mL IVPB        100 mg 125 mL/hr over 120 Minutes Intravenous Every 12 hours 04/09/23 1252 04/09/23 1746   04/08/23 1300  levofloxacin  (LEVAQUIN ) IVPB 500 mg  Status:  Discontinued        500 mg 100 mL/hr over 60 Minutes Intravenous On call to O.R. 04/08/23 1229 04/08/23 1648   04/08/23 1300  vancomycin  (VANCOREADY) IVPB 1500 mg/300 mL        1,500 mg 100 mL/hr over 180 Minutes Intravenous On call to O.R. 04/08/23 1229 04/08/23 1433   04/08/23 1237  vancomycin  HCl (VANCOREADY) 1500 MG/300ML IVPB       Note to Pharmacy: Barron Friday D: cabinet override      04/08/23 1237 04/08/23 1439   04/07/23 0800  piperacillin -tazobactam (ZOSYN ) IVPB 3.375 g        3.375 g 12.5 mL/hr over 240 Minutes Intravenous Every 8 hours 04/07/23 0108 04/09/23 2108   04/07/23 0100  doxycycline  (VIBRAMYCIN ) 100 mg in dextrose  5 % 250 mL IVPB  Status:  Discontinued        100 mg 125 mL/hr over  120 Minutes Intravenous Every 12 hours 04/07/23 0057 04/09/23 1252   04/07/23 0000  ceFEPIme  (MAXIPIME ) 2 g in sodium chloride  0.9 % 100 mL IVPB  Status:  Discontinued        2 g 200 mL/hr over 30 Minutes Intravenous Every 8 hours 04/06/23 2349 04/07/23 0108   04/06/23 2330  metroNIDAZOLE  (FLAGYL ) IVPB 500 mg        500 mg 100 mL/hr over 60 Minutes Intravenous  Once 04/06/23 2321 04/07/23 0032   04/06/23 2300  piperacillin -tazobactam (ZOSYN ) IVPB  3.375 g  Status:  Discontinued        3.375 g 100 mL/hr over 30 Minutes Intravenous  Once 04/06/23 2257 04/06/23 2320   04/06/23 2300  vancomycin  (VANCOREADY) IVPB 2000 mg/400 mL  Status:  Discontinued        2,000 mg 200 mL/hr over 120 Minutes Intravenous  Once 04/06/23 2257 04/06/23 2312       Procedures: 04-08-2023 Right BKA  Consultants: [name]    Assessment and Plan: * Sepsis (HCC) 05-11-2023 resolved after treatment and amputation of right foot.  S/P BKA (below knee amputation), right [facility]) - 04-08-2023 05-13-2023 pt has stump shrinker on right LE stump 05-14-2023 has stump shrinkers on both LE stumps  CKD (chronic kidney disease), stage II 05-11-2023 Scr 1.03 today. 05-12-2023 check BMP tomorrow since he has started Entresto  yesterday. 05-14-2023 Scr 0.95 yesterday  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 05-11-2023 chronic. stable. Start low dose entresto . Scr 1.02. euvolemic. Doesn't need scheduled lasix . 05-12-2023 started on low dose entresto  24-26 bid. Has had 3 doses so far.  Will check BMP in AM.  05-13-2023 awaiting this AM labs. May be able to increase entresto  dose. 05-14-2023 Pt has refused 3 doses of lopressor  thus far(evening dose 05-12-23, both doses 05-13-2023). Also refused both doses of Entresto  yesterday. Do not change any doses until pt has taken current doses with some regularity. 05-15-2023 continues to refuse lopressor  and entresto . Make no dosing changes. If pt starts taking entresto  on a regular basis, check BMP to monitor Scr.  05-16-2023 pt just started taking his lopressor  75 mg BID and Entresto  49-51 bid yesterday.  No changes for now. Monitor BP. Check Scr tomorrow. No indication for diuretics at this point.  Essential hypertension 05-11-2023 increase lopressor  to 75 mg bid due to uncontrolled BP. Currently on 50 mg bid. 05-12-2023 pt has only had 2 doses of 75 mg lopressor . Will  monitor today and adjust. HR 77. He has room to increase lopressor  dose.  05-13-2023 he refused last night's dose of lopressor . HR 101 this AM. May need to change to Toprol -XL to ensure better compliance 05-14-2023 Pt has refused 3 doses of lopressor  thus far(evening dose 05-12-23, both doses 05-13-2023). Do not change lopressor  dose until pt has been taking medication with some regularity. 05-15-2023 continues to refuse lopressor  and entresto . Make no dosing changes. If pt starts taking entresto  on a regular basis, check BMP to monitor Scr. 05-16-2023 pt just started taking his lopressor  75 mg BID and Entresto  49-51 bid yesterday.  No changes for now. Monitor BP.  Diarrhea-resolved as of 05/11/2023 Resolved.  CAP (community acquired pneumonia)-resolved as of 05/11/2023 Treated and resolved.  Metabolic acidosis-resolved as of 05/11/2023 Resolved.  Acute on chronic anemia 05-11-2023 iron  level is somewhat low. Will start nu-iron  150 mg daily. Iron /TIBC/Ferritin/ %Sat    Component Value Date/Time   IRON  34 (L) 04/10/2023 1338   TIBC  NOT CALCULATED 04/10/2023 1338   IRONPCTSAT NOT CALCULATED 04/10/2023 1338   05-12-2023 continue nu-iron  150 mg daily.  Acute osteomyelitis of right foot (HCC) 05-11-2023 resolved after right BKA on 04-08-2023.  COVID-19 virus infection 05-11-2023 messaged with infection control RN. Pt's covid test was positive on 04-30-2023. Today is 12 days from PCR. Pt can come out of droplet isolation. 05-12-2023 still has a slight cough. Advised pt and wife that this will get better over time.  Malnutrition of moderate degree 05-11-2023 seen RD noted from 05-07-2023. Moderate Malnutrition related to chronic illness as evidenced by energy intake < or equal to 75% for > or equal to 1 month, moderate fat depletion, moderate muscle depletion.   Cutaneous abscess of right foot 05-11-2023 resolved after right BKA on 04-08-2023.  Subacute osteomyelitis of right foot  (HCC) 05-11-2023 resolved after right BKA on 04-08-2023.  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  Hx of left BKA (HCC) 05-11-2023 chronic. 05-13-2023 has stump shrinker on left LE stump 05-14-2023 has stump shrinkers on both LE stumps  Insulin  dependent type 2 diabetes mellitus (HCC) 05-12-2023 continue with SSI. CBG in adequate ranges.  Obesity, Class II, BMI 35-39.9 05-11-2023 BMI 37.03  05-13-2023 pt has lost 28 lbs since admission. Admit weight of 320 lbs. Current weight of 292.11 lbs.   DVT prophylaxis:   Lovenox    Code Status: Full Code Family Communication: no family at bedside Disposition Plan: awaiting SNF placement. Reason for continuing need for hospitalization: medically stable for DC.  Objective: Vitals:   05/16/23 0439 05/16/23 0443 05/16/23 0748 05/16/23 0836  BP:  (!) 145/82 (!) 170/95 (!) 170/95  Pulse:  80 90 90  Resp:   18   Temp:  (!) 97.5 F (36.4 C) 97.8 F (36.6 C)   TempSrc:  Oral Oral   SpO2:  100% 100%   Weight: 135.5 kg     Height:        Intake/Output Summary (Last 24 hours) at 05/16/2023 1155 Last data filed at 05/16/2023 0811 Gross per 24 hour  Intake 600 ml  Output 900 ml  Net -300 ml   Filed Weights   05/13/23 0331 05/14/23 0443 05/16/23 0439  Weight: 132.5 kg 135.5 kg 135.5 kg    Examination:  Physical Exam Vitals and nursing note reviewed.  Constitutional:      General: He is not in acute distress.    Appearance: He is obese. He is not toxic-appearing.  HENT:     Head: Normocephalic and atraumatic.     Nose: Nose normal.  Eyes:     General: No scleral icterus. Cardiovascular:     Rate and Rhythm: Normal rate and regular rhythm.  Pulmonary:     Effort: Pulmonary effort is normal.  Abdominal:     General: Bowel sounds are normal. There is no distension.     Palpations: Abdomen is soft.  Musculoskeletal:     Comments: Bilateral BKA  Neurological:     Mental Status: He is alert  and oriented to person, place, and time.  Psychiatric:        Mood and Affect: Affect is flat.        Behavior: Behavior is withdrawn.     Data Reviewed: I have personally reviewed following labs and imaging studies  CBC: Recent Labs  Lab 05/10/23 0518  WBC 6.3  NEUTROABS 3.7  HGB 7.9*  HCT 26.8*  MCV 86.5  PLT 284   Basic Metabolic Panel: Recent  Labs  Lab 05/10/23 0518 05/11/23 0616 05/13/23 0818  NA 135 140 139  K 4.1 3.8 3.8  CL 106 107 108  CO2 20* 25 27  GLUCOSE 91 87 78  BUN 9 8 7   CREATININE 1.00 1.02 0.95  CALCIUM  7.5* 7.9* 7.8*   GFR: Estimated Creatinine Clearance: 144.5 mL/min (by C-G formula based on SCr of 0.95 mg/dL). Liver Function Tests: Recent Labs  Lab 05/13/23 0818  AST 14*  ALT 14  ALKPHOS 229*  BILITOT 0.2  PROT 5.7*  ALBUMIN  <1.5*   BNP (last 3 results) Recent Labs    12/14/22 0839  BNP 197.3*   CBG: Recent Labs  Lab 05/15/23 1151 05/15/23 1612 05/15/23 2002 05/16/23 0748 05/16/23 1124  GLUCAP 96 116* 140* 117* 123*    Radiology Studies: ECHOCARDIOGRAM COMPLETE Result Date: 05/15/2023    ECHOCARDIOGRAM REPORT   Patient Name:   Alan Higginbotham. Date of Exam: 05/15/2023 Medical Rec #:  [id number]          Height:       76.0 in Accession #:    [id number]         Weight:       298.7 lb Date of Birth:  01-Apr-1976          BSA:          2.627 m Patient Age:    47 years           BP:           150/86 mmHg Patient Gender: M                  HR:           78 bpm. Exam Location:  Inpatient Procedure: 2D Echo, Cardiac Doppler and Color Doppler Indications:    CHF I50.9  History:        Patient has prior history of Echocardiogram examinations, most                 recent 08/17/2022.  Sonographer:    Tinnie Gosling RDCS Referring Phys: [phone] Kaileen Bronkema IMPRESSIONS  1. Left ventricular ejection fraction, by estimation, is 30%. The left ventricle has severely decreased function. The left ventricle demonstrates global hypokinesis. The left ventricular  internal cavity size was mildly dilated. Left ventricular diastolic parameters are consistent with Grade II diastolic dysfunction (pseudonormalization).  2. Right ventricular systolic function is normal. The right ventricular size is normal.  3. Left atrial size was mildly dilated.  4. The mitral valve is normal in structure. Mild mitral valve regurgitation. No evidence of mitral stenosis.  5. The aortic valve is normal in structure. Aortic valve regurgitation is not visualized. No aortic stenosis is present.  6. The inferior vena cava is normal in size with greater than 50% respiratory variability, suggesting right atrial pressure of 3 mmHg. FINDINGS  Left Ventricle: Left ventricular ejection fraction, by estimation, is 30%. The left ventricle has severely decreased function. The left ventricle demonstrates global hypokinesis. The left ventricular internal cavity size was mildly dilated. There is no left ventricular hypertrophy. Left ventricular diastolic parameters are consistent with Grade II diastolic dysfunction (pseudonormalization). Right Ventricle: The right ventricular size is normal. No increase in right ventricular wall thickness. Right ventricular systolic function is normal. Left Atrium: Left atrial size was mildly dilated. Right Atrium: Right atrial size was normal in size. Pericardium: There is no evidence of pericardial effusion. Mitral Valve: The mitral valve is normal in structure. Mild mitral  valve regurgitation. No evidence of mitral valve stenosis. Tricuspid Valve: The tricuspid valve is normal in structure. Tricuspid valve regurgitation is trivial. No evidence of tricuspid stenosis. Aortic Valve: The aortic valve is normal in structure. Aortic valve regurgitation is not visualized. No aortic stenosis is present. Pulmonic Valve: The pulmonic valve was not well visualized. Pulmonic valve regurgitation is not visualized. No evidence of pulmonic stenosis. Aorta: The aortic root is normal in size  and structure. Venous: The inferior vena cava is normal in size with greater than 50% respiratory variability, suggesting right atrial pressure of 3 mmHg. IAS/Shunts: No atrial level shunt detected by color flow Doppler.  LEFT VENTRICLE PLAX 2D LVIDd:         5.60 cm      Diastology LVIDs:         4.40 cm      LV e' medial:    4.79 cm/s LV PW:         0.90 cm      LV E/e' medial:  16.9 LV IVS:        0.80 cm      LV e' lateral:   7.07 cm/s LVOT diam:     2.30 cm      LV E/e' lateral: 11.4 LV SV:         61 LV SV Index:   23 LVOT Area:     4.15 cm  LV Volumes (MOD) LV vol d, MOD A2C: 227.0 ml LV vol d, MOD A4C: 275.0 ml LV vol s, MOD A2C: 158.0 ml LV vol s, MOD A4C: 187.0 ml LV SV MOD A2C:     69.0 ml LV SV MOD A4C:     275.0 ml LV SV MOD BP:      80.9 ml RIGHT VENTRICLE            IVC RV S prime:     9.57 cm/s  IVC diam: 3.30 cm TAPSE (M-mode): 1.6 cm LEFT ATRIUM              Index        RIGHT ATRIUM           Index LA diam:        4.40 cm  1.67 cm/m   RA Area:     14.70 cm LA Vol (A2C):   93.5 ml  35.59 ml/m  RA Volume:   39.80 ml  15.15 ml/m LA Vol (A4C):   98.1 ml  37.34 ml/m LA Biplane Vol: 106.0 ml 40.35 ml/m  AORTIC VALVE LVOT Vmax:   80.20 cm/s LVOT Vmean:  56.900 cm/s LVOT VTI:    0.147 m  AORTA Ao Root diam: 2.90 cm Ao Asc diam:  2.90 cm MITRAL VALVE MV Area (PHT): 4.71 cm    SHUNTS MV Decel Time: 161 msec    Systemic VTI:  0.15 m MV E velocity: 80.90 cm/s  Systemic Diam: 2.30 cm MV A velocity: 72.10 cm/s MV E/A ratio:  1.12 Toribio Fuel MD Electronically signed by Toribio Fuel MD Signature Date/Time: 05/15/2023/3:48:51 PM    Final     Scheduled Meds:  docusate sodium   100 mg Oral BID   enoxaparin  (LOVENOX ) injection  70 mg Subcutaneous Daily   feeding supplement  237 mL Oral BID BM   Gerhardt's butt cream   Topical BID   insulin  aspart  0-9 Units Subcutaneous TID WC   lidocaine   2 patch Transdermal Q24H   metoprolol  tartrate  75 mg Oral BID  naloxegol  oxalate  25 mg Oral Daily    nutrition supplement (JUVEN)  1 packet Oral BID BM   pantoprazole   40 mg Oral BID   saccharomyces boulardii  250 mg Oral BID   sacubitril -valsartan   1 tablet Oral BID   Continuous Infusions:   LOS: 39 days   Time spent: 40 minutes  Camellia Door, DO  [facility]  05/16/2023, 11:55 AM

## 2023-05-16 NOTE — Progress Notes (Signed)
 Patient refused most of his daily medications, Physician aware.  Patient also refused to turn on his in order for me to assess his sacral wound, patient was educated.

## 2023-05-17 DIAGNOSIS — I5042 Chronic combined systolic (congestive) and diastolic (congestive) heart failure: Secondary | ICD-10-CM | POA: Diagnosis not present

## 2023-05-17 DIAGNOSIS — E440 Moderate protein-calorie malnutrition: Secondary | ICD-10-CM | POA: Diagnosis not present

## 2023-05-17 DIAGNOSIS — I10 Essential (primary) hypertension: Secondary | ICD-10-CM | POA: Diagnosis not present

## 2023-05-17 DIAGNOSIS — Z89511 Acquired absence of right leg below knee: Secondary | ICD-10-CM | POA: Diagnosis not present

## 2023-05-17 LAB — COMPREHENSIVE METABOLIC PANEL
ALT: 13 U/L (ref 0–44)
AST: 13 U/L — ABNORMAL LOW (ref 15–41)
Albumin: <1.5 g/dL — ABNORMAL LOW (ref 3.5–5.0)
Alkaline Phosphatase: 198 U/L — ABNORMAL HIGH (ref 38–126)
Anion gap: 6 (ref 5–15)
BUN: 6 mg/dL (ref 6–20)
CO2: 26 mmol/L (ref 22–32)
Calcium: 7.7 mg/dL — ABNORMAL LOW (ref 8.9–10.3)
Chloride: 107 mmol/L (ref 98–111)
Creatinine, Ser: 1 mg/dL (ref 0.61–1.24)
GFR, Estimated: >60 mL/min (ref >60)
Glucose, Bld: 139 mg/dL — ABNORMAL HIGH (ref 70–99)
Potassium: 3.9 mmol/L (ref 3.5–5.1)
Sodium: 139 mmol/L (ref 135–145)
Total Bilirubin: 0.6 mg/dL (ref 0.0–1.2)
Total Protein: 5.6 g/dL — ABNORMAL LOW (ref 6.5–8.1)

## 2023-05-17 LAB — GLUCOSE, CAPILLARY
Glucose-Capillary: 136 mg/dL — ABNORMAL HIGH (ref 70–99)
Glucose-Capillary: 151 mg/dL — ABNORMAL HIGH (ref 70–99)
Glucose-Capillary: 142 mg/dL — ABNORMAL HIGH (ref 70–99)
Glucose-Capillary: 148 mg/dL — ABNORMAL HIGH (ref 70–99)

## 2023-05-17 NOTE — Progress Notes (Addendum)
 PROGRESS NOTE    Alan Tapia.  FMW:[id number] DOB: [birth year] DOA: 04/06/2023 PCP: Patient, No Pcp Per  Subjective: Pt seen and examined. [name] not at bedside. Pt is calm today. Seems depressed He did take both doses of lopressor  yesterday and this AM's dose of lopressor  Only took PM dose of Entresto  yesterday. Refused yesterday's AM dose. Did take this AM's dose of Entresto .  His BP is slowly decreasing. No stump edema  Awaiting TOC to find him a SNF for DC.   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 He was also having nausea, vomiting and diarrhea. Orthopedic surgery was consulted and patient underwent right BKA 11/27 by Dr. Harden.  HPI: Alan Tapia. is a 48 y.o. male with medical history significant of right lower extremity below-knee amputation, dilated cardiomyopathy, combined systolic and diastolic heart failure with reduced EF 35 to 40%, essential hypertension, adjustment disorder, phantom phenomenon pain, insulin -dependent DM type II, peripheral neuropathy, hyperlipidemia and insomnia presented emergency departments for evaluation for malodorous wound of his left foot.  Patient reported he has significant diabetic foot infection.   During my evaluation at the bedside patient reported that he has been admitted in the [facility] in August 2024 when he had left-sided foot infection and since he has been discharged from the hospital unfortunately he lost follow-up with primary care doctor as well as lost follow-up with wound care clinic as well.  He has not has got any refill of any blood pressure regimen and insulin  for last 4  months.   Patient reporting that he has ongoing nausea, vomiting and diarrhea for last 1 month.  Also patient noticed right-sided heel wound with malodorous discharge for last 2 weeks.  Patient denies any foot pain.  Denies any fever and chills at home.  Denies any abdominal pain. Patient denies chest pain, orthopnea, PND, dyspnea, headache, change of appetite retroportal and constipation.   Antibiotic Therapy: Anti-infectives (From admission, onward)    Start     Dose/Rate Route Frequency Ordered Stop   04/30/23 1800  nirmatrelvir /ritonavir  (PAXLOVID ) 3 tablet        3 tablet Oral 2 times daily 04/30/23 1621 05/05/23 0820   04/09/23 1345  doxycycline  (VIBRAMYCIN ) 100 mg in dextrose  5 % 250 mL IVPB        100 mg 125 mL/hr over 120 Minutes Intravenous Every 12 hours 04/09/23 1252 04/09/23 1746   04/08/23 1300  levofloxacin  (LEVAQUIN ) IVPB 500 mg  Status:  Discontinued        500 mg 100 mL/hr over 60 Minutes Intravenous On call to O.R. 04/08/23 1229 04/08/23 1648   04/08/23 1300  vancomycin  (VANCOREADY) IVPB 1500 mg/300 mL        1,500 mg 100 mL/hr over 180 Minutes Intravenous On call to O.R. 04/08/23 1229 04/08/23 1433   04/08/23 1237  vancomycin  HCl (VANCOREADY) 1500 MG/300ML IVPB       Note to Pharmacy: Barron Friday D: cabinet override      04/08/23 1237 04/08/23 1439   04/07/23 0800  piperacillin -tazobactam (ZOSYN ) IVPB 3.375 g        3.375 g 12.5 mL/hr over 240 Minutes Intravenous Every 8 hours 04/07/23 0108 04/09/23 2108   04/07/23 0100  doxycycline  (  VIBRAMYCIN ) 100 mg in dextrose  5 % 250 mL IVPB  Status:  Discontinued        100 mg 125 mL/hr over 120 Minutes Intravenous Every 12 hours 04/07/23 0057 04/09/23 1252   04/07/23 0000  ceFEPIme  (MAXIPIME ) 2 g in sodium chloride  0.9 % 100 mL IVPB  Status:  Discontinued        2 g 200 mL/hr over 30 Minutes Intravenous Every 8 hours 04/06/23 2349 04/07/23 0108   04/06/23 2330  metroNIDAZOLE  (FLAGYL ) IVPB 500 mg        500 mg 100  mL/hr over 60 Minutes Intravenous  Once 04/06/23 2321 04/07/23 0032   04/06/23 2300  piperacillin -tazobactam (ZOSYN ) IVPB 3.375 g  Status:  Discontinued        3.375 g 100 mL/hr over 30 Minutes Intravenous  Once 04/06/23 2257 04/06/23 2320   04/06/23 2300  vancomycin  (VANCOREADY) IVPB 2000 mg/400 mL  Status:  Discontinued        2,000 mg 200 mL/hr over 120 Minutes Intravenous  Once 04/06/23 2257 04/06/23 2312       Procedures: 04-08-2023 Right BKA  Consultants: [name]    Assessment and Plan: * Sepsis (HCC) 05-11-2023 resolved after treatment and amputation of right foot.  S/P BKA (below knee amputation), right [facility]) - 04-08-2023 05-13-2023 pt has stump shrinker on right LE stump 05-14-2023 has stump shrinkers on both LE stumps  CKD (chronic kidney disease), stage II 05-11-2023 Scr 1.03 today. 05-12-2023 check BMP tomorrow since he has started Entresto  yesterday. 05-14-2023 Scr 0.95 yesterday 05-16-2022 Scr 1.0 today.  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 05-11-2023 chronic. stable. Start low dose entresto . Scr 1.02. euvolemic. Doesn't need scheduled lasix . 05-12-2023 started on low dose entresto  24-26 bid. Has had 3 doses so far.  Will check BMP in AM.  05-13-2023 awaiting this AM labs. May be able to increase entresto  dose. 05-14-2023 Pt has refused 3 doses of lopressor  thus far(evening dose 05-12-23, both doses 05-13-2023). Also refused both doses of Entresto  yesterday. Do not change any doses until pt has taken current doses with some regularity. 05-15-2023 continues to refuse lopressor  and entresto . Make no dosing changes. If pt starts taking entresto  on a regular basis, check BMP to monitor Scr.  05-16-2023 pt just started taking his lopressor  75 mg BID and Entresto  49-51 bid yesterday.  No changes for now. Monitor BP. Check Scr tomorrow. No indication for diuretics at this point. 05-17-2023 repeat echo shows  worsening LVEF of 30%. Was 40% in 08-2021.  Pt has not been compliant with taking CHF GDMT the last several weeks due to oppositional/defiant behavior. He has had some improving compliance with taking Lopressor  and Entresto  over the last 2 days. Will need to see how long this keeps up. Scr is stable. Repeat CMP on Wednesday, May 20, 2023 if he remains compliant with taking CHF GDMT. Remains euvolemic. No indication for diuretics. Given his bilateral LE amputation and his morbid obesity, adding diuretics will be cumbersome for his daily care as he will likely need a lot of assistance to use the urinal.  Essential hypertension 05-11-2023 increase lopressor  to 75 mg bid due to uncontrolled BP. Currently on 50 mg bid. 05-12-2023 pt has only had 2 doses of 75 mg lopressor . Will monitor today and adjust. HR 77. He has room to increase lopressor  dose.  05-13-2023 he refused last night's dose of lopressor . HR 101 this AM. May need to change to  Toprol -XL to ensure better compliance 05-14-2023 Pt has refused 3 doses of lopressor  thus far(evening dose 05-12-23, both doses 05-13-2023). Do not change lopressor  dose until pt has been taking medication with some regularity. 05-15-2023 continues to refuse lopressor  and entresto . Make no dosing changes. If pt starts taking entresto  on a regular basis, check BMP to monitor Scr. 05-16-2023 pt just started taking his lopressor  75 mg BID and Entresto  49-51 bid yesterday.  No changes for now. Monitor BP. 05-17-2023 improving compliance with taking Lopressor  and Entresto . Scr is stable. Repeat CMP on Wednesday, May 20, 2023 if he remains compliant with taking CHF GDMT.  Diarrhea-resolved as of 05/11/2023 Resolved.  CAP (community acquired pneumonia)-resolved as of 05/11/2023 Treated and resolved.  Metabolic acidosis-resolved as of 05/11/2023 Resolved.  Acute on chronic anemia 05-11-2023 iron  level is somewhat low. Will start nu-iron  150 mg daily. Iron /TIBC/Ferritin/  %Sat    Component Value Date/Time   IRON  34 (L) 04/10/2023 1338   TIBC NOT CALCULATED 04/10/2023 1338   IRONPCTSAT NOT CALCULATED 04/10/2023 1338   05-12-2023 continue nu-iron  150 mg daily.  Acute osteomyelitis of right foot (HCC) 05-11-2023 resolved after right BKA on 04-08-2023.  COVID-19 virus infection 05-11-2023 messaged with infection control RN. Pt's covid test was positive on 04-30-2023. Today is 12 days from PCR. Pt can come out of droplet isolation. 05-12-2023 still has a slight cough. Advised pt and wife that this will get better over time.  Malnutrition of moderate degree 05-11-2023 seen RD noted from 05-07-2023. Moderate Malnutrition related to chronic illness as evidenced by energy intake < or equal to 75% for > or equal to 1 month, moderate fat depletion, moderate muscle depletion.   Cutaneous abscess of right foot 05-11-2023 resolved after right BKA on 04-08-2023.  Subacute osteomyelitis of right foot (HCC) 05-11-2023 resolved after right BKA on 04-08-2023.  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  Hx of left BKA (HCC) 05-11-2023 chronic. 05-13-2023 has stump shrinker on left LE stump 05-14-2023 has stump shrinkers on both LE stumps  Insulin  dependent type 2 diabetes mellitus (HCC) 05-12-2023 continue with SSI. CBG in adequate ranges.  Obesity, Class II, BMI 35-39.9 05-11-2023 BMI 37.03  05-13-2023 pt has lost 28 lbs since admission. Admit weight of 320 lbs. Current weight of 292.11 lbs.   DVT prophylaxis:     Code Status: Full Code Family Communication: no family at bedside. Wife has been absent for 2 days now. Ever since pt was yelling at his wife. Disposition Plan: SNF Reason for continuing need for hospitalization: medically stable for DC to SNF  Objective: Vitals:   05/16/23 2015 05/17/23 0312 05/17/23 0313 05/17/23 0722  BP: (!) 170/89  (!) 156/92 (!) 155/82  Pulse: 90  83 88  Resp: 18  18 19   Temp: 98  F (36.7 C)  98.4 F (36.9 C) 98 F (36.7 C)  TempSrc: Oral  Oral Oral  SpO2: 100%  94% 97%  Weight:  134.7 kg    Height:        Intake/Output Summary (Last 24 hours) at 05/17/2023 1041 Last data filed at 05/17/2023 0000 Gross per 24 hour  Intake 240 ml  Output 800 ml  Net -560 ml   Filed Weights   05/14/23 0443 05/16/23 0439 05/17/23 0312  Weight: 135.5 kg 135.5 kg 134.7 kg    Examination:  Physical Exam Vitals and nursing note reviewed.  Constitutional:      Appearance: He is obese.  HENT:  Head: Normocephalic and atraumatic.     Nose: Nose normal.  Cardiovascular:     Rate and Rhythm: Normal rate and regular rhythm.  Pulmonary:     Effort: Pulmonary effort is normal.     Breath sounds: Normal breath sounds.  Abdominal:     General: Bowel sounds are normal.     Palpations: Abdomen is soft.  Musculoskeletal:     Comments: Bilateral LE BKA. Bilateral stump shrinkers on.  Skin:    Capillary Refill: Capillary refill takes less than 2 seconds.  Neurological:     Mental Status: He is alert and oriented to person, place, and time.  Psychiatric:        Mood and Affect: Affect is flat. Affect is not angry or inappropriate.        Behavior: Behavior is withdrawn.     Data Reviewed: I have personally reviewed following labs and imaging studies  CBC: No results for input(s): WBC, NEUTROABS, HGB, HCT, MCV, PLT in the last 168 hours. Basic Metabolic Panel: Recent Labs  Lab 05/11/23 0616 05/13/23 0818 05/17/23 0633  NA 140 139 139  K 3.8 3.8 3.9  CL 107 108 107  CO2 25 27 26   GLUCOSE 87 78 139*  BUN 8 7 6   CREATININE 1.02 0.95 1.00  CALCIUM  7.9* 7.8* 7.7*   GFR: Estimated Creatinine Clearance: 136.9 mL/min (by C-G formula based on SCr of 1 mg/dL). Liver Function Tests: Recent Labs  Lab 05/13/23 0818 05/17/23 0633  AST 14* 13*  ALT 14 13  ALKPHOS 229* 198*  BILITOT 0.2 0.6  PROT 5.7* 5.6*  ALBUMIN  <1.5* <1.5*   BNP (last 3  results) Recent Labs    12/14/22 0839  BNP 197.3*   CBG: Recent Labs  Lab 05/16/23 0748 05/16/23 1124 05/16/23 1757 05/16/23 2016 05/17/23 0724  GLUCAP 117* 123* 116* 140* 136*    Radiology Studies: ECHOCARDIOGRAM COMPLETE Result Date: 05/15/2023    ECHOCARDIOGRAM REPORT   Patient Name:   Alan Tapia. Date of Exam: 05/15/2023 Medical Rec #:  [id number]          Height:       76.0 in Accession #:    [id number]         Weight:       298.7 lb Date of Birth:  1975/12/18          BSA:          2.627 m Patient Age:    47 years           BP:           150/86 mmHg Patient Gender: M                  HR:           78 bpm. Exam Location:  Inpatient Procedure: 2D Echo, Cardiac Doppler and Color Doppler Indications:    CHF I50.9  History:        Patient has prior history of Echocardiogram examinations, most                 recent 08/17/2022.  Sonographer:    Tinnie Gosling RDCS Referring Phys: [phone] Kimela Malstrom IMPRESSIONS  1. Left ventricular ejection fraction, by estimation, is 30%. The left ventricle has severely decreased function. The left ventricle demonstrates global hypokinesis. The left ventricular internal cavity size was mildly dilated. Left ventricular diastolic parameters are consistent with Grade II diastolic dysfunction (pseudonormalization).  2. Right ventricular systolic function  is normal. The right ventricular size is normal.  3. Left atrial size was mildly dilated.  4. The mitral valve is normal in structure. Mild mitral valve regurgitation. No evidence of mitral stenosis.  5. The aortic valve is normal in structure. Aortic valve regurgitation is not visualized. No aortic stenosis is present.  6. The inferior vena cava is normal in size with greater than 50% respiratory variability, suggesting right atrial pressure of 3 mmHg. FINDINGS  Left Ventricle: Left ventricular ejection fraction, by estimation, is 30%. The left ventricle has severely decreased function. The left ventricle demonstrates  global hypokinesis. The left ventricular internal cavity size was mildly dilated. There is no left ventricular hypertrophy. Left ventricular diastolic parameters are consistent with Grade II diastolic dysfunction (pseudonormalization). Right Ventricle: The right ventricular size is normal. No increase in right ventricular wall thickness. Right ventricular systolic function is normal. Left Atrium: Left atrial size was mildly dilated. Right Atrium: Right atrial size was normal in size. Pericardium: There is no evidence of pericardial effusion. Mitral Valve: The mitral valve is normal in structure. Mild mitral valve regurgitation. No evidence of mitral valve stenosis. Tricuspid Valve: The tricuspid valve is normal in structure. Tricuspid valve regurgitation is trivial. No evidence of tricuspid stenosis. Aortic Valve: The aortic valve is normal in structure. Aortic valve regurgitation is not visualized. No aortic stenosis is present. Pulmonic Valve: The pulmonic valve was not well visualized. Pulmonic valve regurgitation is not visualized. No evidence of pulmonic stenosis. Aorta: The aortic root is normal in size and structure. Venous: The inferior vena cava is normal in size with greater than 50% respiratory variability, suggesting right atrial pressure of 3 mmHg. IAS/Shunts: No atrial level shunt detected by color flow Doppler.  LEFT VENTRICLE PLAX 2D LVIDd:         5.60 cm      Diastology LVIDs:         4.40 cm      LV e' medial:    4.79 cm/s LV PW:         0.90 cm      LV E/e' medial:  16.9 LV IVS:        0.80 cm      LV e' lateral:   7.07 cm/s LVOT diam:     2.30 cm      LV E/e' lateral: 11.4 LV SV:         61 LV SV Index:   23 LVOT Area:     4.15 cm  LV Volumes (MOD) LV vol d, MOD A2C: 227.0 ml LV vol d, MOD A4C: 275.0 ml LV vol s, MOD A2C: 158.0 ml LV vol s, MOD A4C: 187.0 ml LV SV MOD A2C:     69.0 ml LV SV MOD A4C:     275.0 ml LV SV MOD BP:      80.9 ml RIGHT VENTRICLE            IVC RV S prime:     9.57 cm/s   IVC diam: 3.30 cm TAPSE (M-mode): 1.6 cm LEFT ATRIUM              Index        RIGHT ATRIUM           Index LA diam:        4.40 cm  1.67 cm/m   RA Area:     14.70 cm LA Vol (A2C):   93.5 ml  35.59 ml/m  RA Volume:  39.80 ml  15.15 ml/m LA Vol (A4C):   98.1 ml  37.34 ml/m LA Biplane Vol: 106.0 ml 40.35 ml/m  AORTIC VALVE LVOT Vmax:   80.20 cm/s LVOT Vmean:  56.900 cm/s LVOT VTI:    0.147 m  AORTA Ao Root diam: 2.90 cm Ao Asc diam:  2.90 cm MITRAL VALVE MV Area (PHT): 4.71 cm    SHUNTS MV Decel Time: 161 msec    Systemic VTI:  0.15 m MV E velocity: 80.90 cm/s  Systemic Diam: 2.30 cm MV A velocity: 72.10 cm/s MV E/A ratio:  1.12 Toribio Fuel MD Electronically signed by Toribio Fuel MD Signature Date/Time: 05/15/2023/3:48:51 PM    Final     Scheduled Meds:  docusate sodium   100 mg Oral BID   enoxaparin  (LOVENOX ) injection  70 mg Subcutaneous Daily   feeding supplement  237 mL Oral BID BM   Gerhardt's butt cream   Topical BID   insulin  aspart  0-9 Units Subcutaneous TID WC   lidocaine   2 patch Transdermal Q24H   metoprolol  tartrate  75 mg Oral BID   nutrition supplement (JUVEN)  1 packet Oral BID BM   pantoprazole   40 mg Oral BID   saccharomyces boulardii  250 mg Oral BID   sacubitril -valsartan   1 tablet Oral BID   Continuous Infusions:   LOS: 40 days   Time spent: 40 minutes  Camellia Door, DO  [facility]  05/17/2023, 10:41 AM

## 2023-05-18 DIAGNOSIS — E440 Moderate protein-calorie malnutrition: Secondary | ICD-10-CM | POA: Diagnosis not present

## 2023-05-18 DIAGNOSIS — I10 Essential (primary) hypertension: Secondary | ICD-10-CM | POA: Diagnosis not present

## 2023-05-18 DIAGNOSIS — N182 Chronic kidney disease, stage 2 (mild): Secondary | ICD-10-CM | POA: Diagnosis not present

## 2023-05-18 DIAGNOSIS — I5042 Chronic combined systolic (congestive) and diastolic (congestive) heart failure: Secondary | ICD-10-CM | POA: Diagnosis not present

## 2023-05-18 LAB — GLUCOSE, CAPILLARY
Glucose-Capillary: 144 mg/dL — ABNORMAL HIGH (ref 70–99)
Glucose-Capillary: 140 mg/dL — ABNORMAL HIGH (ref 70–99)
Glucose-Capillary: 154 mg/dL — ABNORMAL HIGH (ref 70–99)
Glucose-Capillary: 155 mg/dL — ABNORMAL HIGH (ref 70–99)

## 2023-05-18 MED ORDER — METOPROLOL TARTRATE 50 MG PO TABS
100.0000 mg | ORAL_TABLET | Freq: Two times a day (BID) | ORAL | Status: DC
  Administered 2023-05-18 (×2): 100 mg via ORAL
  Filled 2023-05-18 (×2): qty 2

## 2023-05-18 NOTE — Telephone Encounter (Signed)
 I have tried to call the pt's wife again and verified the phone number in the pt's chart and message states that call can not be completed at this time.

## 2023-05-18 NOTE — Plan of Care (Signed)
  Problem: Fluid Volume: Goal: Hemodynamic stability will improve Outcome: Progressing   Problem: Clinical Measurements: Goal: Signs and symptoms of infection will decrease Outcome: Progressing   Problem: Respiratory: Goal: Ability to maintain adequate ventilation will improve Outcome: Progressing   Problem: Activity: Goal: Risk for activity intolerance will decrease Outcome: Progressing   Problem: Coping: Goal: Level of anxiety will decrease Outcome: Progressing   Problem: Elimination: Goal: Will not experience complications related to bowel motility Outcome: Progressing   Problem: Safety: Goal: Ability to remain free from injury will improve Outcome: Progressing   Problem: Skin Integrity: Goal: Risk for impaired skin integrity will decrease Outcome: Progressing

## 2023-05-18 NOTE — TOC Progression Note (Signed)
 Transition of Care (TOC) - Progression Note    Patient Details  Name: Alan Tapia. MRN: [id number] Date of Birth: 1976/03/21  [facility] CM/SW Contact  Harlene Sharps, LCSW Phone Number: 05/18/2023, 3:12 PM  Clinical Narrative:    CSW continues to follow and work with DSS/ family to assist in placement needs. At this time plan has changed from DC home to possible SNF placement. Team meeting with DSS and family in the works due to conflicting stories. TOC continues to follow.   Expected Discharge Plan: Skilled Nursing Facility Barriers to Discharge: Insurance Authorization, Continued Medical Work up, SNF Pending bed offer  Expected Discharge Plan and Services In-house Referral: Clinical Social Work     Living arrangements for the past 2 months: Single Family Home Expected Discharge Date: 04/11/23                                     Social Determinants of Health (SDOH) Interventions SDOH Screenings   Food Insecurity: No Food Insecurity (04/07/2023)  Housing: Unknown (04/23/2023)  Recent Concern: Housing - Medium Risk (04/07/2023)  Transportation Needs: No Transportation Needs (04/07/2023)  Recent Concern: Transportation Needs - Unmet Transportation Needs (02/17/2023)   Received from [facility]  Utilities: At Risk (04/07/2023)  Alcohol  Screen: Low Risk  (08/20/2022)  Financial Resource Strain: Patient Unable To Answer (08/20/2022)  Tobacco Use: Low Risk  (04/08/2023)    Readmission Risk Interventions    04/07/2023    2:34 PM  Readmission Risk Prevention Plan  Transportation Screening Complete  PCP or Specialist Appt within 3-5 Days Complete  HRI or Home Care Consult Complete  Social Work Consult for Recovery Care Planning/Counseling Complete  Palliative Care Screening Not Applicable  Medication Review Oceanographer) Complete

## 2023-05-18 NOTE — Progress Notes (Signed)
 PROGRESS NOTE    Alan Tapia.  FMW:[id number] DOB: [birth year] DOA: 04/06/2023 PCP: Patient, No Pcp Per  Subjective: Pt seen and examined. [name] not at bedside. Pt now consistently taking his lopressor  and Entresto . Can increase lopressor  to 100 mg bid. If he tolerates this, can change over to Toprol -xl. He thinks that reducing number of pills is helping with his pill fatigue. Reviewed that he needs to take his GDMT due to worsening CHF on echo. He agrees.   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 He was also having nausea, vomiting and diarrhea. Orthopedic surgery was consulted and patient underwent right BKA 11/27 by Dr. Harden.  HPI: Alan Tapia. is a 48 y.o. male with medical history significant of right lower extremity below-knee amputation, dilated cardiomyopathy, combined systolic and diastolic heart failure with reduced EF 35 to 40%, essential hypertension, adjustment disorder, phantom phenomenon pain, insulin -dependent DM type II, peripheral neuropathy, hyperlipidemia and insomnia presented emergency departments for evaluation for malodorous wound of his left foot.  Patient reported he has significant diabetic foot infection.   During my evaluation at the bedside patient reported that he has been admitted in the [facility] in August 2024 when he had left-sided foot infection and since he has been discharged from the hospital unfortunately he lost follow-up with primary care doctor as well as lost follow-up with wound care clinic as well.  He has not has got any refill of any blood pressure regimen and insulin  for last 4 months.    Patient reporting that he has ongoing nausea, vomiting and diarrhea for last 1 month.  Also patient noticed right-sided heel wound with malodorous discharge for last 2 weeks.  Patient denies any foot pain.  Denies any fever and chills at home.  Denies any abdominal pain. Patient denies chest pain, orthopnea, PND, dyspnea, headache, change of appetite retroportal and constipation.   Antibiotic Therapy: Anti-infectives (From admission, onward)    Start     Dose/Rate Route Frequency Ordered Stop   04/30/23 1800  nirmatrelvir /ritonavir  (PAXLOVID ) 3 tablet        3 tablet Oral 2 times daily 04/30/23 1621 05/05/23 0820   04/09/23 1345  doxycycline  (VIBRAMYCIN ) 100 mg in dextrose  5 % 250 mL IVPB        100 mg 125 mL/hr over 120 Minutes Intravenous Every 12 hours 04/09/23 1252 04/09/23 1746   04/08/23 1300  levofloxacin  (LEVAQUIN ) IVPB 500 mg  Status:  Discontinued        500 mg 100 mL/hr over 60 Minutes Intravenous On call to O.R. 04/08/23 1229 04/08/23 1648   04/08/23 1300  vancomycin  (VANCOREADY) IVPB 1500 mg/300 mL        1,500 mg 100 mL/hr over 180 Minutes Intravenous On call to O.R. 04/08/23 1229 04/08/23 1433   04/08/23 1237  vancomycin  HCl (VANCOREADY) 1500 MG/300ML IVPB       Note to Pharmacy: Barron Friday D: cabinet override      04/08/23 1237 04/08/23 1439   04/07/23 0800  piperacillin -tazobactam (ZOSYN ) IVPB 3.375 g        3.375 g 12.5 mL/hr over 240 Minutes Intravenous Every 8 hours 04/07/23 0108 04/09/23 2108   04/07/23 0100  doxycycline  (VIBRAMYCIN ) 100 mg in  dextrose  5 % 250 mL IVPB  Status:  Discontinued        100 mg 125 mL/hr over 120 Minutes Intravenous Every 12 hours 04/07/23 0057 04/09/23 1252   04/07/23 0000  ceFEPIme  (MAXIPIME ) 2 g in sodium chloride  0.9 % 100 mL IVPB  Status:  Discontinued        2 g 200 mL/hr over 30 Minutes Intravenous Every 8 hours 04/06/23 2349 04/07/23 0108   04/06/23 2330  metroNIDAZOLE  (FLAGYL ) IVPB 500 mg        500 mg 100 mL/hr over 60  Minutes Intravenous  Once 04/06/23 2321 04/07/23 0032   04/06/23 2300  piperacillin -tazobactam (ZOSYN ) IVPB 3.375 g  Status:  Discontinued        3.375 g 100 mL/hr over 30 Minutes Intravenous  Once 04/06/23 2257 04/06/23 2320   04/06/23 2300  vancomycin  (VANCOREADY) IVPB 2000 mg/400 mL  Status:  Discontinued        2,000 mg 200 mL/hr over 120 Minutes Intravenous  Once 04/06/23 2257 04/06/23 2312       Procedures: 04-08-2023 Right BKA  Consultants: [name]    Assessment and Plan: * Sepsis (HCC) 05-11-2023 resolved after treatment and amputation of right foot.  S/P BKA (below knee amputation), right [facility]) - 04-08-2023 05-13-2023 pt has stump shrinker on right LE stump 05-14-2023 has stump shrinkers on both LE stumps  05-18-2023 only has stump shrinker on right LE today. No stump shrinker on Left LE today.  CKD (chronic kidney disease), stage II 05-11-2023 Scr 1.03 today. 05-12-2023 check BMP tomorrow since he has started Entresto  yesterday. 05-14-2023 Scr 0.95 yesterday 05-17-2023 Scr 1.0 today. 05-18-2023 plan to check CMP on Wednesday, May 20, 2023. Pt has poor vein and he complains a lot about frequent attempts at venipuncture. Trying to limit lab attempts.  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 05-11-2023 chronic. stable. Start low dose entresto . Scr 1.02. euvolemic. Doesn't need scheduled lasix . 05-12-2023 started on low dose entresto  24-26 bid. Has had 3 doses so far.  Will check BMP in AM.  05-13-2023 awaiting this AM labs. May be able to increase entresto  dose. 05-14-2023 Pt has refused 3 doses of lopressor  thus far(evening dose 05-12-23, both doses 05-13-2023). Also refused both doses of Entresto  yesterday. Do not change any doses until pt has taken current doses with some regularity. 05-15-2023 continues to refuse lopressor  and entresto . Make no dosing changes. If pt starts taking entresto  on a regular basis,  check BMP to monitor Scr.  05-16-2023 pt just started taking his lopressor  75 mg BID and Entresto  49-51 bid yesterday.  No changes for now. Monitor BP. Check Scr tomorrow. No indication for diuretics at this point. 05-17-2023 repeat echo shows worsening LVEF of 30%. Was 40% in 08-2021.  Pt has not been compliant with taking CHF GDMT the last several weeks due to oppositional/defiant behavior. He has had some improving compliance with taking Lopressor  and Entresto  over the last 2 days. Will need to see how long this keeps up. Scr is stable. Repeat CMP on Wednesday, May 20, 2023 if he remains compliant with taking CHF GDMT. Remains euvolemic. No indication for diuretics. Given his bilateral LE amputation and his morbid obesity, adding diuretics will be cumbersome for his daily care as he will likely need a lot of assistance to use the urinal.  05-18-2023 will increase lopressor  to 100 mg bid.  If tolerates this for 2-4 doses, consider changing to Toprol -XL  200 mg qday  After change to Toprol -XL, if BP still has room for titration, can increase Entresto  dose. Currently on Entresto  49-51 bid  Essential hypertension 05-11-2023 increase lopressor  to 75 mg bid due to uncontrolled BP. Currently on 50 mg bid. 05-12-2023 pt has only had 2 doses of 75 mg lopressor . Will monitor today and adjust. HR 77. He has room to increase lopressor  dose. 05-13-2023 he refused last night's dose of lopressor . HR 101 this AM. May need to change to Toprol -XL to ensure better compliance 05-14-2023 Pt has refused 3 doses of lopressor  thus far(evening dose 05-12-23, both doses 05-13-2023). Do not change lopressor  dose until pt has been taking medication with some regularity. 05-15-2023 continues to refuse lopressor  and entresto . Make no dosing changes. If pt starts taking entresto  on a regular basis, check BMP to monitor Scr. 05-16-2023 pt just started taking his lopressor  75 mg BID and Entresto  49-51 bid yesterday.  No changes for now.  Monitor BP. 05-17-2023 improving compliance with taking Lopressor  and Entresto . Scr is stable. Repeat CMP on Wednesday, May 20, 2023 if he remains compliant with taking CHF GDMT.  05-18-2023 will increase lopressor  to 100 mg bid.  If tolerates this for 2-4 doses, consider changing to Toprol -XL 200 mg qday  After change to Toprol -XL, if BP still has room for titration, can increase Entresto  dose. Currently on Entresto  49-51 bid  Diarrhea-resolved as of 05/11/2023 Resolved.  CAP (community acquired pneumonia)-resolved as of 05/11/2023 Treated and resolved.  Metabolic acidosis-resolved as of 05/11/2023 Resolved.  Acute on chronic anemia 05-11-2023 iron  level is somewhat low. Will start nu-iron  150 mg daily. Iron /TIBC/Ferritin/ %Sat    Component Value Date/Time   IRON  34 (L) 04/10/2023 1338   TIBC NOT CALCULATED 04/10/2023 1338   IRONPCTSAT NOT CALCULATED 04/10/2023 1338   05-12-2023 continue nu-iron  150 mg daily.  Acute osteomyelitis of right foot (HCC) 05-11-2023 resolved after right BKA on 04-08-2023.  COVID-19 virus infection 05-11-2023 messaged with infection control RN. Pt's covid test was positive on 04-30-2023. Today is 12 days from PCR. Pt can come out of droplet isolation. 05-12-2023 still has a slight cough. Advised pt and wife that this will get better over time.  Malnutrition of moderate degree 05-11-2023 seen RD noted from 05-07-2023. Moderate Malnutrition related to chronic illness as evidenced by energy intake < or equal to 75% for > or equal to 1 month, moderate fat depletion, moderate muscle depletion.   Cutaneous abscess of right foot 05-11-2023 resolved after right BKA on 04-08-2023.  Subacute osteomyelitis of right foot (HCC) 05-11-2023 resolved after right BKA on 04-08-2023.  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  Hx of left BKA (HCC) 05-11-2023 chronic. 05-13-2023 has stump shrinker on left LE stump 05-14-2023  has stump shrinkers on both LE stumps  05-18-2023 only has stump shrinker on right LE today. No stump shrinker on Left LE today.  Insulin  dependent type 2 diabetes mellitus (HCC) 05-12-2023 continue with SSI. CBG in adequate ranges.  Obesity, Class II, BMI 35-39.9 05-11-2023 BMI 37.03  05-13-2023 pt has lost 28 lbs since admission. Admit weight of 320 lbs. Current weight of 292.11 lbs.       DVT prophylaxis:   Lovenox    Code Status: Full Code Family Communication: no family at bedside. [name] has been absent for 3 days now ever since pt was yelling at her on 05-14-2023. Disposition Plan: SNF Reason for continuing need for hospitalization: medically stable for DC.  Objective: Vitals:  05/17/23 2238 05/18/23 0424 05/18/23 0428 05/18/23 0749  BP: (!) 141/78  (!) 153/85 (!) 145/79  Pulse: 97  85 83  Resp:   20 17  Temp:   97.8 F (36.6 C) 97.6 F (36.4 C)  TempSrc:   Oral Oral  SpO2:   100% 96%  Weight:  133.7 kg    Height:        Intake/Output Summary (Last 24 hours) at 05/18/2023 1107 Last data filed at 05/17/2023 2248 Gross per 24 hour  Intake --  Output 900 ml  Net -900 ml   Filed Weights   05/16/23 0439 05/17/23 0312 05/18/23 0424  Weight: 135.5 kg 134.7 kg 133.7 kg    Examination:  Physical Exam Vitals and nursing note reviewed.  Constitutional:      General: He is not in acute distress.    Appearance: He is not toxic-appearing or diaphoretic.  HENT:     Head: Normocephalic and atraumatic.     Nose: Nose normal.  Eyes:     General: No scleral icterus. Cardiovascular:     Rate and Rhythm: Normal rate and regular rhythm.  Pulmonary:     Effort: Pulmonary effort is normal.     Breath sounds: Normal breath sounds.  Abdominal:     General: Bowel sounds are normal.     Palpations: Abdomen is soft.  Musculoskeletal:     Comments: Bilateral BKAs  Skin:    General: Skin is warm and dry.     Capillary Refill: Capillary refill takes less than 2  seconds.  Neurological:     Mental Status: He is alert and oriented to person, place, and time.  Psychiatric:        Mood and Affect: Mood normal.     Data Reviewed: I have personally reviewed following labs and imaging studies  CBC: No results for input(s): WBC, NEUTROABS, HGB, HCT, MCV, PLT in the last 168 hours. Basic Metabolic Panel: Recent Labs  Lab 05/13/23 0818 05/17/23 0633  NA 139 139  K 3.8 3.9  CL 108 107  CO2 27 26  GLUCOSE 78 139*  BUN 7 6  CREATININE 0.95 1.00  CALCIUM  7.8* 7.7*   GFR: Estimated Creatinine Clearance: 136.4 mL/min (by C-G formula based on SCr of 1 mg/dL). Liver Function Tests: Recent Labs  Lab 05/13/23 0818 05/17/23 0633  AST 14* 13*  ALT 14 13  ALKPHOS 229* 198*  BILITOT 0.2 0.6  PROT 5.7* 5.6*  ALBUMIN  <1.5* <1.5*   BNP (last 3 results) Recent Labs    12/14/22 0839  BNP 197.3*   CBG: Recent Labs  Lab 05/17/23 0724 05/17/23 1146 05/17/23 1702 05/17/23 2147 05/18/23 0750  GLUCAP 136* 151* 142* 148* 144*    Scheduled Meds:  docusate sodium   100 mg Oral BID   enoxaparin  (LOVENOX ) injection  70 mg Subcutaneous Daily   feeding supplement  237 mL Oral BID BM   Gerhardt's butt cream   Topical BID   insulin  aspart  0-9 Units Subcutaneous TID WC   lidocaine   2 patch Transdermal Q24H   metoprolol  tartrate  100 mg Oral BID   nutrition supplement (JUVEN)  1 packet Oral BID BM   pantoprazole   40 mg Oral BID   saccharomyces boulardii  250 mg Oral BID   sacubitril -valsartan   1 tablet Oral BID   Continuous Infusions:   LOS: 41 days   Time spent: 40 minutes  Camellia Door, DO  [facility]  05/18/2023, 11:07 AM

## 2023-05-19 DIAGNOSIS — G894 Chronic pain syndrome: Secondary | ICD-10-CM

## 2023-05-19 DIAGNOSIS — I5042 Chronic combined systolic (congestive) and diastolic (congestive) heart failure: Secondary | ICD-10-CM | POA: Diagnosis not present

## 2023-05-19 DIAGNOSIS — E119 Type 2 diabetes mellitus without complications: Secondary | ICD-10-CM | POA: Diagnosis not present

## 2023-05-19 DIAGNOSIS — I10 Essential (primary) hypertension: Secondary | ICD-10-CM | POA: Diagnosis not present

## 2023-05-19 DIAGNOSIS — G8929 Other chronic pain: Secondary | ICD-10-CM

## 2023-05-19 LAB — GLUCOSE, CAPILLARY
Glucose-Capillary: 107 mg/dL — ABNORMAL HIGH (ref 70–99)
Glucose-Capillary: 95 mg/dL (ref 70–99)
Glucose-Capillary: 118 mg/dL — ABNORMAL HIGH (ref 70–99)
Glucose-Capillary: 128 mg/dL — ABNORMAL HIGH (ref 70–99)

## 2023-05-19 MED ORDER — METOPROLOL SUCCINATE ER 100 MG PO TB24
200.0000 mg | ORAL_TABLET | Freq: Every day | ORAL | Status: DC
  Administered 2023-05-19 – 2023-06-29 (×42): 200 mg via ORAL
  Filled 2023-05-19 (×43): qty 2

## 2023-05-19 MED ORDER — METHOCARBAMOL 500 MG PO TABS
500.0000 mg | ORAL_TABLET | ORAL | Status: DC | PRN
  Administered 2023-05-19 – 2023-06-29 (×116): 500 mg via ORAL
  Filled 2023-05-19 (×120): qty 1

## 2023-05-19 NOTE — Plan of Care (Signed)
  Problem: Pain Management: Goal: General experience of comfort will improve Outcome: Progressing   Problem: Safety: Goal: Ability to remain free from injury will improve Outcome: Progressing

## 2023-05-19 NOTE — Assessment & Plan Note (Signed)
 05-19-2023 remains on prn oxycodone. Pt does not want any more addicting pain meds. I told him that all pain meds were addicting. He c/o mostly of cramping in his legs/stumps. Will increase robaxin to q4h prn. Currently at q8h prn.

## 2023-05-19 NOTE — Progress Notes (Signed)
 PROGRESS NOTE    Alan Tapia.  FMW:[id number] DOB: 08-Apr-1976 DOA: 04/06/2023 PCP: Patient, No Pcp Per  Subjective: Pt seen and examined. [name] not at bedside. Pt now consistently taking his lopressor  and Entresto . Have changed lopressor  100 mg bid over to Toprol -XL 200 mg qday. Continue with Entresto  49/51 bid.  Pt c/o of pain/muscle spasms. Doesn't want any more addicting pain meds. Discussed increasing his robaxin  to q4h prn.   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 He was also having nausea, vomiting and diarrhea. Orthopedic surgery was consulted and patient underwent right BKA 11/27 by Dr. Harden.  HPI: Alan Tapia. is a 48 y.o. male with medical history significant of right lower extremity below-knee amputation, dilated cardiomyopathy, combined systolic and diastolic heart failure with reduced EF 35 to 40%, essential hypertension, adjustment disorder, phantom phenomenon pain, insulin -dependent DM type II, peripheral neuropathy, hyperlipidemia and insomnia presented emergency departments for evaluation for malodorous wound of his left foot.  Patient reported he has significant diabetic foot infection.   During my evaluation at the bedside patient reported that he has been admitted in the [facility] in August 2024 when he had left-sided foot infection and since he has been discharged from the hospital unfortunately he lost follow-up with primary care doctor as well as lost follow-up with wound care clinic as well.  He has not has got any refill of any blood pressure regimen and insulin  for last 4 months.   Patient reporting that  he has ongoing nausea, vomiting and diarrhea for last 1 month.  Also patient noticed right-sided heel wound with malodorous discharge for last 2 weeks.  Patient denies any foot pain.  Denies any fever and chills at home.  Denies any abdominal pain. Patient denies chest pain, orthopnea, PND, dyspnea, headache, change of appetite retroportal and constipation.   Antibiotic Therapy: Anti-infectives (From admission, onward)    Start     Dose/Rate Route Frequency Ordered Stop   04/30/23 1800  nirmatrelvir /ritonavir  (PAXLOVID ) 3 tablet        3 tablet Oral 2 times daily 04/30/23 1621 05/05/23 0820   04/09/23 1345  doxycycline  (VIBRAMYCIN ) 100 mg in dextrose  5 % 250 mL IVPB        100 mg 125 mL/hr over 120 Minutes Intravenous Every 12 hours 04/09/23 1252 04/09/23 1746   04/08/23 1300  levofloxacin  (LEVAQUIN ) IVPB 500 mg  Status:  Discontinued        500 mg 100 mL/hr over 60 Minutes Intravenous On call to O.R. 04/08/23 1229 04/08/23 1648   04/08/23 1300  vancomycin  (VANCOREADY) IVPB 1500 mg/300 mL        1,500 mg 100 mL/hr over 180 Minutes Intravenous On call to O.R. 04/08/23 1229 04/08/23 1433   04/08/23 1237  vancomycin  HCl (VANCOREADY) 1500 MG/300ML IVPB       Note to Pharmacy: Barron Friday D: cabinet override      04/08/23 1237 04/08/23 1439   04/07/23 0800  piperacillin -tazobactam (ZOSYN ) IVPB 3.375 g        3.375 g 12.5 mL/hr over 240 Minutes Intravenous Every 8 hours 04/07/23 0108 04/09/23 2108   04/07/23 0100  doxycycline  (VIBRAMYCIN ) 100 mg in dextrose  5 % 250 mL IVPB  Status:  Discontinued        100 mg 125 mL/hr over 120 Minutes Intravenous Every 12 hours 04/07/23 0057 04/09/23 1252   04/07/23 0000  ceFEPIme  (MAXIPIME ) 2 g in sodium chloride  0.9 % 100 mL IVPB  Status:  Discontinued        2 g 200 mL/hr over 30 Minutes Intravenous Every 8 hours 04/06/23 2349 04/07/23 0108   04/06/23 2330  metroNIDAZOLE  (FLAGYL ) IVPB 500 mg        500 mg 100 mL/hr over 60 Minutes Intravenous  Once  04/06/23 2321 04/07/23 0032   04/06/23 2300  piperacillin -tazobactam (ZOSYN ) IVPB 3.375 g  Status:  Discontinued        3.375 g 100 mL/hr over 30 Minutes Intravenous  Once 04/06/23 2257 04/06/23 2320   04/06/23 2300  vancomycin  (VANCOREADY) IVPB 2000 mg/400 mL  Status:  Discontinued        2,000 mg 200 mL/hr over 120 Minutes Intravenous  Once 04/06/23 2257 04/06/23 2312       Procedures: 04-08-2023 Right BKA  Consultants: [name]    Assessment and Plan: * Sepsis (HCC) 05-11-2023 resolved after treatment and amputation of right foot.  S/P BKA (below knee amputation), right [facility]) - 04-08-2023 05-13-2023 pt has stump shrinker on right LE stump 05-14-2023 has stump shrinkers on both LE stumps  05-18-2023 only has stump shrinker on right LE today. No stump shrinker on Left LE today.  CKD (chronic kidney disease), stage II 05-11-2023 Scr 1.03 today. 05-12-2023 check BMP tomorrow since he has started Entresto  yesterday. 05-14-2023 Scr 0.95 yesterday 05-17-2023 Scr 1.0 today. 05-18-2023 plan to check CMP on Wednesday, May 20, 2023. Pt has poor vein and he complains a lot about frequent attempts at venipuncture. Trying to limit lab attempts.  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 05-11-2023 chronic. stable. Start low dose entresto . Scr 1.02. euvolemic. Doesn't need scheduled lasix . 05-12-2023 started on low dose entresto  24-26 bid. Has had 3 doses so far.  Will check BMP in AM. 05-13-2023 awaiting this AM labs. May be able to increase entresto  dose. 05-14-2023 Pt has refused 3 doses of lopressor  thus far(evening dose 05-12-23, both doses 05-13-2023). Also refused both doses of Entresto  yesterday. Do not change any doses until pt has taken current doses with some regularity. 05-15-2023 continues to refuse lopressor  and entresto . Make no dosing changes. If pt starts taking entresto  on a regular basis, check BMP to monitor Scr.   05-16-2023 pt just started taking his lopressor  75 mg BID and Entresto  49-51 bid yesterday.  No changes for now. Monitor BP. Check Scr tomorrow. No indication for diuretics at this point. 05-17-2023 repeat echo shows worsening LVEF of 30%. Was 40% in 08-2021.  Pt has not been compliant with taking CHF GDMT the last several weeks due to oppositional/defiant behavior. He has had some improving compliance with taking Lopressor  and Entresto  over the last 2 days. Will need to see how long this keeps up. Scr is stable. Repeat CMP on Wednesday, May 20, 2023 if he remains compliant with taking CHF GDMT. Remains euvolemic. No indication for diuretics. Given his bilateral LE amputation and his morbid obesity, adding diuretics will be cumbersome for his daily care as he will likely need a lot of assistance to use the urinal. 05-18-2023 will increase lopressor  to 100 mg bid.  If tolerates this for 2-4 doses, consider changing to Toprol -XL 200 mg qday  After change to Toprol -XL, if BP still  has room for titration, can increase Entresto  dose. Currently on Entresto  49-51 bid  05-19-2023 tolerating lopressor  100 mg bid. SBP still >150. Will change over to Toprol -XL 200 mg qday. This will help with his pill fatigue.  Remains on Entresto  49-51 PO BID. Check renal function tomorrow. If renal function stable, will increase Entresto  to 97/103 bid on 05-20-2023 and check renal function on Friday, May 22, 2023.  Pt seems more compliant with taking his meds this week. Seems to have coincided when his wife left the hospital after he yelled at her on 05-15-2023.   Essential hypertension 05-11-2023 increase lopressor  to 75 mg bid due to uncontrolled BP. Currently on 50 mg bid. 05-12-2023 pt has only had 2 doses of 75 mg lopressor . Will monitor today and adjust. HR 77. He has room to increase lopressor  dose. 05-13-2023 he refused last night's dose of lopressor . HR 101 this AM. May need to change to Toprol -XL to ensure better  compliance 05-14-2023 Pt has refused 3 doses of lopressor  thus far(evening dose 05-12-23, both doses 05-13-2023). Do not change lopressor  dose until pt has been taking medication with some regularity. 05-15-2023 continues to refuse lopressor  and entresto . Make no dosing changes. If pt starts taking entresto  on a regular basis, check BMP to monitor Scr. 05-16-2023 pt just started taking his lopressor  75 mg BID and Entresto  49-51 bid yesterday.  No changes for now. Monitor BP. 05-17-2023 improving compliance with taking Lopressor  and Entresto . Scr is stable. Repeat CMP on Wednesday, May 20, 2023 if he remains compliant with taking CHF GDMT. 05-18-2023 will increase lopressor  to 100 mg bid.  If tolerates this for 2-4 doses, consider changing to Toprol -XL 200 mg qday  After change to Toprol -XL, if BP still has room for titration, can increase Entresto  dose. Currently on Entresto  49-51 bid  05-19-2023 tolerating lopressor  100 mg bid. SBP still >150. Will change over to Toprol -XL 200 mg qday. This will help with his pill fatigue.  Remains on Entresto  49-51 PO BID. Check renal function tomorrow. If renal function stable, will increase Entresto  to 97/103 bid on 05-20-2023 and check renal function on Friday, May 22, 2023.  Pt seems more compliant with taking his meds this week. Seems to have coincided when his wife left the hospital after he yelled at her on 05-15-2023.  Diarrhea-resolved as of 05/11/2023 Resolved.  CAP (community acquired pneumonia)-resolved as of 05/11/2023 Treated and resolved.  Metabolic acidosis-resolved as of 05/11/2023 Resolved.  Chronic pain 05-19-2023 remains on prn oxycodone . Pt does not want any more addicting pain meds. I told him that all pain meds were addicting. He c/o mostly of cramping in his legs/stumps. Will increase robaxin  to q4h prn. Currently at q8h prn.  Acute on chronic anemia 05-11-2023 iron  level is somewhat low. Will start nu-iron  150 mg daily. Iron /TIBC/Ferritin/  %Sat    Component Value Date/Time   IRON  34 (L) 04/10/2023 1338   TIBC NOT CALCULATED 04/10/2023 1338   IRONPCTSAT NOT CALCULATED 04/10/2023 1338   05-12-2023 continue nu-iron  150 mg daily.  Acute osteomyelitis of right foot (HCC) 05-11-2023 resolved after right BKA on 04-08-2023.  COVID-19 virus infection 05-11-2023 messaged with infection control RN. Pt's covid test was positive on 04-30-2023. Today is 12 days from PCR. Pt can come out of droplet isolation. 05-12-2023 still has a slight cough. Advised pt and wife that this will get better over time.  Malnutrition of moderate degree 05-11-2023 seen RD noted from 05-07-2023. Moderate Malnutrition related to chronic illness as evidenced by  energy intake < or equal to 75% for > or equal to 1 month, moderate fat depletion, moderate muscle depletion.   Cutaneous abscess of right foot 05-11-2023 resolved after right BKA on 04-08-2023.  Subacute osteomyelitis of right foot (HCC) 05-11-2023 resolved after right BKA on 04-08-2023.  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  Hx of left BKA (HCC) 05-11-2023 chronic. 05-13-2023 has stump shrinker on left LE stump 05-14-2023 has stump shrinkers on both LE stumps  05-18-2023 only has stump shrinker on right LE today. No stump shrinker on Left LE today.  Insulin  dependent type 2 diabetes mellitus (HCC) 05-12-2023 continue with SSI. CBG in adequate ranges.  Obesity, Class II, BMI 35-39.9 05-11-2023 BMI 37.03  05-13-2023 pt has lost 28 lbs since admission. Admit weight of 320 lbs. Current weight of 292.11 lbs.       DVT prophylaxis:   Lovenox    Code Status: Full Code Family Communication: no family or wife at bedside. Have not seen pt's wife since 05-15-2023 Disposition Plan: SNF Reason for continuing need for hospitalization: medically stable for DC  Objective: Vitals:   05/19/23 0425 05/19/23 0500 05/19/23 0729 05/19/23 0845  BP: (!)  146/92  (!) 153/87 (!) 153/87  Pulse: 83  87 87  Resp: 16  18   Temp: 97.9 F (36.6 C)  98.1 F (36.7 C)   TempSrc: Oral  Oral   SpO2: 95%  96%   Weight:  133.7 kg    Height:        Intake/Output Summary (Last 24 hours) at 05/19/2023 1231 Last data filed at 05/19/2023 0800 Gross per 24 hour  Intake 720 ml  Output 400 ml  Net 320 ml   Filed Weights   05/17/23 0312 05/18/23 0424 05/19/23 0500  Weight: 134.7 kg 133.7 kg 133.7 kg    Examination:  Physical Exam Vitals and nursing note reviewed.  Constitutional:      Appearance: He is obese.  HENT:     Head: Normocephalic and atraumatic.     Nose: Nose normal.  Eyes:     General: No scleral icterus. Pulmonary:     Effort: Pulmonary effort is normal. No respiratory distress.  Musculoskeletal:     Comments: Bilateral BKA  Neurological:     Mental Status: He is alert and oriented to person, place, and time.     Data Reviewed: I have personally reviewed following labs and imaging studies  Basic Metabolic Panel: Recent Labs  Lab 05/13/23 0818 05/17/23 0633  NA 139 139  K 3.8 3.9  CL 108 107  CO2 27 26  GLUCOSE 78 139*  BUN 7 6  CREATININE 0.95 1.00  CALCIUM  7.8* 7.7*   GFR: Estimated Creatinine Clearance: 136.4 mL/min (by C-G formula based on SCr of 1 mg/dL). Liver Function Tests: Recent Labs  Lab 05/13/23 0818 05/17/23 0633  AST 14* 13*  ALT 14 13  ALKPHOS 229* 198*  BILITOT 0.2 0.6  PROT 5.7* 5.6*  ALBUMIN  <1.5* <1.5*   BNP (last 3 results) Recent Labs    12/14/22 0839  BNP 197.3*   CBG: Recent Labs  Lab 05/18/23 1202 05/18/23 1729 05/18/23 2020 05/19/23 0728 05/19/23 1200  GLUCAP 140* 154* 155* 107* 95    Scheduled Meds:  docusate sodium   100 mg Oral BID   enoxaparin  (LOVENOX ) injection  70 mg Subcutaneous Daily   feeding supplement  237 mL Oral BID BM   Gerhardt's butt cream   Topical BID  insulin  aspart  0-9 Units Subcutaneous TID WC   lidocaine   2 patch Transdermal Q24H    metoprolol  succinate  200 mg Oral Daily   nutrition supplement (JUVEN)  1 packet Oral BID BM   pantoprazole   40 mg Oral BID   saccharomyces boulardii  250 mg Oral BID   sacubitril -valsartan   1 tablet Oral BID   Continuous Infusions:   LOS: 42 days   Time spent: 40 minutes  Camellia Door, DO  [facility]  05/19/2023, 12:31 PM

## 2023-05-19 NOTE — Progress Notes (Signed)
 Physical Therapy Treatment Patient Details Name: Alan Tapia. MRN: [id number] DOB: [birth year] Today's Date: 05/19/2023   History of Present Illness The pt is a 48 yo male presenting 11/25 with nausea, vomiting, and fever as well as R foot wound. 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    PT Comments  Pt reporting preferring not trialing transfer out of bed without spouse present; pt has some fear of falling due to last session sliding anteriorly while in wheelchair, with +3 assist required to return to bed. Pt agreeable to participate otherwise and demonstrates good activity tolerance. Simulated  anterior posterior transfer with maxi slide by progressing to long sitting position and maneuvering hips to edge of bed. Pt performed core and balance exercises edge of bed. Patient will benefit from continued inpatient follow up therapy, <3 hours/day.   If plan is discharge home, recommend the following: Two people to help with walking and/or transfers;Assist for transportation;Assistance with cooking/housework;Help with stairs or ramp for entrance   Can travel by private vehicle     No  Equipment Recommendations  Hoyer lift;Wheelchair cushion (measurements PT);Wheelchair (measurements PT);Hospital bed    Recommendations for Other Services       Precautions / Restrictions Precautions Precautions: Fall Restrictions Weight Bearing Restrictions Per Provider Order: Yes RLE Weight Bearing Per Provider Order: Non weight bearing     Mobility  Bed Mobility Overal bed mobility: Needs Assistance Bed Mobility: Supine to Sit, Rolling Rolling: Min assist   Supine to sit: Mod assist     General bed mobility comments: Utilized bed rails and modA to transition to long sitting position. +2 with use of maxi slide to maneuver hips to  edge of bed for simulated anterior posterior transfer. Cues for lateral lean to off weight hip and head/hip relationship. Pt minimally using BUE's to assist    Transfers                        Ambulation/Gait                   Stairs             Wheelchair Mobility     Tilt Bed    Modified Rankin (Stroke Patients Only)       Balance Overall balance assessment: Needs assistance Sitting-balance support: Feet unsupported, No upper extremity supported Sitting balance-Leahy Scale: Fair                                      Cognition Arousal: Alert Behavior During Therapy: WFL for tasks assessed/performed Overall Cognitive Status: Within Functional Limits for tasks assessed                                          Exercises Amputee Exercises Hip Flexion/Marching: Both, 10 reps, Seated, Other (comment) (2 sets) Knee Extension: Both, 15 reps, Seated, Other (comment) (2 sets) Other Exercises Other Exercises: Sitting: lateral leans onto R/L elbow x 10 each, modified sit ups x 10, modified sit ups with ball toss x 10, ball toss x 20    General Comments  Pertinent Vitals/Pain Pain Assessment Pain Assessment: Faces Faces Pain Scale: Hurts even more Pain Location: R residual limb, perineum Pain Descriptors / Indicators: Discomfort, Grimacing, Guarding, Moaning, Cramping, Spasm Pain Intervention(s): Monitored during session, Limited activity within patient's tolerance    Home Living                          Prior Function            PT Goals (current goals can now be found in the care plan section) Acute Rehab PT Goals Patient Stated Goal: get bil prosthetics Potential to Achieve Goals: Fair Progress towards PT goals: Progressing toward goals    Frequency    Min 1X/week      PT Plan      Co-evaluation              AM-PAC PT 6 Clicks Mobility   Outcome Measure  Help needed  turning from your back to your side while in a flat bed without using bedrails?: A Little Help needed moving from lying on your back to sitting on the side of a flat bed without using bedrails?: A Lot Help needed moving to and from a bed to a chair (including a wheelchair)?: Total Help needed standing up from a chair using your arms (e.g., wheelchair or bedside chair)?: Total Help needed to walk in hospital room?: Total Help needed climbing 3-5 steps with a railing? : Total 6 Click Score: 9    End of Session   Activity Tolerance: Patient tolerated treatment well Patient left: in bed;with call bell/phone within reach Nurse Communication: Mobility status PT Visit Diagnosis: Other abnormalities of gait and mobility (R26.89);Muscle weakness (generalized) (M62.81);Pain Pain - Right/Left: Right Pain - part of body: Leg     Time: 1458-1550 PT Time Calculation (min) (ACUTE ONLY): 52 min  Charges:    $Therapeutic Exercise: 8-22 mins $Therapeutic Activity: 23-37 mins PT General Charges $$ ACUTE PT VISIT: 1 Visit                     Alan Tapia, PT, DPT Acute Rehabilitation Services Office [phone]    Alan Tapia 05/19/2023, 4:34 PM

## 2023-05-20 DIAGNOSIS — Z89511 Acquired absence of right leg below knee: Secondary | ICD-10-CM | POA: Diagnosis not present

## 2023-05-20 DIAGNOSIS — Z89512 Acquired absence of left leg below knee: Secondary | ICD-10-CM | POA: Diagnosis not present

## 2023-05-20 DIAGNOSIS — I10 Essential (primary) hypertension: Secondary | ICD-10-CM | POA: Diagnosis not present

## 2023-05-20 DIAGNOSIS — I5042 Chronic combined systolic (congestive) and diastolic (congestive) heart failure: Secondary | ICD-10-CM | POA: Diagnosis not present

## 2023-05-20 LAB — COMPREHENSIVE METABOLIC PANEL
ALT: 12 U/L (ref 0–44)
AST: 12 U/L — ABNORMAL LOW (ref 15–41)
Albumin: <1.5 g/dL — ABNORMAL LOW (ref 3.5–5.0)
Alkaline Phosphatase: 182 U/L — ABNORMAL HIGH (ref 38–126)
Anion gap: 7 (ref 5–15)
BUN: 8 mg/dL (ref 6–20)
CO2: 27 mmol/L (ref 22–32)
Calcium: 7.7 mg/dL — ABNORMAL LOW (ref 8.9–10.3)
Chloride: 105 mmol/L (ref 98–111)
Creatinine, Ser: 0.93 mg/dL (ref 0.61–1.24)
GFR, Estimated: >60 mL/min (ref >60)
Glucose, Bld: 121 mg/dL — ABNORMAL HIGH (ref 70–99)
Potassium: 3.9 mmol/L (ref 3.5–5.1)
Sodium: 139 mmol/L (ref 135–145)
Total Bilirubin: 0.5 mg/dL (ref 0.0–1.2)
Total Protein: 5.3 g/dL — ABNORMAL LOW (ref 6.5–8.1)

## 2023-05-20 LAB — GLUCOSE, CAPILLARY
Glucose-Capillary: 110 mg/dL — ABNORMAL HIGH (ref 70–99)
Glucose-Capillary: 116 mg/dL — ABNORMAL HIGH (ref 70–99)
Glucose-Capillary: 174 mg/dL — ABNORMAL HIGH (ref 70–99)
Glucose-Capillary: 187 mg/dL — ABNORMAL HIGH (ref 70–99)

## 2023-05-20 LAB — MAGNESIUM: Magnesium: 1.8 mg/dL (ref 1.7–2.4)

## 2023-05-20 MED ORDER — SACUBITRIL-VALSARTAN 97-103 MG PO TABS
1.0000 | ORAL_TABLET | Freq: Two times a day (BID) | ORAL | Status: DC
  Administered 2023-05-20 – 2023-06-29 (×79): 1 via ORAL
  Filled 2023-05-20 (×81): qty 1

## 2023-05-20 NOTE — Progress Notes (Signed)
 Occupational Therapy Treatment Patient Details Name: Alan Tapia. MRN: [id number] DOB: [birth year] Today's Date: 05/20/2023   History of present illness The pt is a 48 yo male presenting 11/25 with nausea, vomiting, and fever as well as R foot wound. 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   OT comments  Pt making fair progress towards OT goals and eager to improve independence. Focused session on pt's current LB ADL strategies and educated on other strategies to trial. Emphasis on flexibility to reach B residual limbs, therapeutic exercises for core strength bed level and education on other UB strengthening exercises with various theraband placement. Pt's spouse at bedside and involved in session. Pt hopeful to progress BSC transfers (likely AP transfers).       If plan is discharge home, recommend the following:  Two people to help with walking and/or transfers;Two people to help with bathing/dressing/bathroom;Assistance with cooking/housework;Assist for transportation;Help with stairs or ramp for entrance   Equipment Recommendations  Hoyer lift    Recommendations for Other Services      Precautions / Restrictions Precautions Precautions: Fall Restrictions Weight Bearing Restrictions Per Provider Order: Yes RLE Weight Bearing Per Provider Order: Non weight bearing Other Position/Activity Restrictions: bilateral BKA (L chronic, R new)       Mobility Bed Mobility Overal bed mobility: Needs Assistance Bed Mobility: Supine to Sit, Sit to Supine     Supine to sit: Supervision, HOB elevated, Used rails Sit to supine: Supervision   General bed mobility comments: HOB elevated with pt using bedcontrols indepedently to sit EOB and return to bed. Pt unable to scoot laterally on bed sheets    Transfers                          Balance Overall balance assessment: Needs assistance Sitting-balance support: Feet unsupported, No upper extremity supported Sitting balance-Leahy Scale: Fair Sitting balance - Comments: fair+. able to reach overhead and across abdomen slowly EOB without LOB though pt anxious                                   ADL either performed or assessed with clinical judgement   ADL Overall ADL's : Needs assistance/impaired             Lower Body Bathing: Moderate assistance;Bed level       Lower Body Dressing: Bed level;Moderate assistance                 General ADL Comments: Emphasis on functional skills assessments for ADLs with education on strategies for LB dressing/bathing. Clean pants not available during session (wife laundering them) so simulated and focused on LB bathing skills - sitting EOB vs long sitting in bed vs rolling side to side). Assist with wrapping R residual limb and repositioning shrinkers (sliding off) and therapeutic exercises that pt can complete    Extremity/Trunk Assessment Upper Extremity Assessment Upper Extremity Assessment: Overall WFL for tasks assessed;Right hand dominant   Lower Extremity Assessment Lower Extremity Assessment: Defer to PT evaluation        Vision   Vision Assessment?: No apparent visual deficits   Perception     Praxis      Cognition Arousal: Alert Behavior During  Therapy: WFL for tasks assessed/performed Overall Cognitive Status: Within Functional Limits for tasks assessed                                          Exercises Exercises: Other exercises Other Exercises Other Exercises: sitting EOB: reaching overhead and reaching across abdomen Other Exercises: theraband around foot board for bicep curls and rows in long sitting Other Exercises: BLE abduction, adduction, quad activation actively bed level Other Exercises: long sitting with B bedrails (Min A)     Shoulder Instructions       General Comments      Pertinent Vitals/ Pain       Pain Assessment Pain Assessment: Faces Faces Pain Scale: Hurts a little bit Pain Location: generalized with movement Pain Descriptors / Indicators: Discomfort Pain Intervention(s): Monitored during session, Limited activity within patient's tolerance  Home Living                                          Prior Functioning/Environment              Frequency  Min 1X/week        Progress Toward Goals  OT Goals(current goals can now be found in the care plan section)  Progress towards OT goals: Progressing toward goals  Acute Rehab OT Goals Patient Stated Goal: increase independence, propel self in w/c on unit, transfer to [facility] OT Goal Formulation: With patient Time For Goal Achievement: 06/04/23 Potential to Achieve Goals: Good ADL Goals Pt Will Perform Grooming: with modified independence;sitting Pt Will Perform Upper Body Bathing: with set-up;sitting Pt Will Perform Upper Body Dressing: with modified independence;sitting Pt Will Perform Lower Body Dressing: with min assist;sitting/lateral leans;bed level Pt Will Transfer to Toilet: with mod assist;with +2 assist;bedside commode;anterior/posterior transfer Pt Will Perform Toileting - Clothing Manipulation and hygiene: with min assist;sitting/lateral leans;bed level Pt/caregiver will Perform Home Exercise Program: Increased strength;Both right and left upper extremity;With theraband;Independently;With written HEP provided Additional ADL Goal #1: Pt to don B shrinkers independently  Plan      Co-evaluation                 AM-PAC OT 6 Clicks Daily Activity     Outcome Measure   Help from another person eating meals?: A Little Help from another person taking care of personal grooming?: A Little Help from another person toileting, which includes using toliet, bedpan, or urinal?: A Lot Help from another person  bathing (including washing, rinsing, drying)?: A Lot Help from another person to put on and taking off regular upper body clothing?: A Little Help from another person to put on and taking off regular lower body clothing?: A Lot 6 Click Score: 15    End of Session    OT Visit Diagnosis: Other abnormalities of gait and mobility (R26.89);Muscle weakness (generalized) (M62.81);Other (comment)   Activity Tolerance Patient tolerated treatment well   Patient Left in bed;with call bell/phone within reach   Nurse Communication          Time: 8665-8574 OT Time Calculation (min): 51 min  Charges: OT General Charges $OT Visit: 1 Visit OT Treatments $Self Care/Home Management : 8-22 mins $Therapeutic Activity: 8-22 mins $Therapeutic Exercise: 8-22 mins  Mliss NOVAK, OTR/L Acute Rehab Services Office: [phone]   Mliss  Britt 05/20/2023, 2:47 PM

## 2023-05-20 NOTE — Plan of Care (Signed)
  Problem: Pain Management: Goal: General experience of comfort will improve Outcome: Progressing   Problem: Safety: Goal: Ability to remain free from injury will improve Outcome: Progressing

## 2023-05-20 NOTE — Progress Notes (Signed)
 PROGRESS NOTE    [name]  FMW:[id number] DOB: June 20, 1975 DOA: 04/06/2023 PCP: Patient, No Pcp Per  Subjective: Pt seen and examined. [name] back at bedside. She had to take a break from seeing patient for several days due to his behavior on 05-15-2023.  OT at bedside and discussing ways on help him achieve more success with movement.  Discussed with pt and wife that pt has been more compliant with taking CHF/HTN meds. Changed to Toprol -XL 200 mg daily. Will also increase Entresto  to 97-103 bid. Repeat BMP on Saturday, Jan 11 to limit venipunctures due to poor veins.   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 He was also having nausea, vomiting and diarrhea. Orthopedic surgery was consulted and patient underwent right BKA 11/27 by Dr. Harden.  HPI: Ashlee Bewley. is a 48 y.o. male with medical history significant of right lower extremity below-knee amputation, dilated cardiomyopathy, combined systolic and diastolic heart failure with reduced EF 35 to 40%, essential hypertension, adjustment disorder, phantom phenomenon pain, insulin -dependent DM type II, peripheral neuropathy, hyperlipidemia and insomnia presented emergency departments for evaluation for malodorous wound of his left foot.  Patient reported he has significant diabetic foot infection.   During my evaluation at the bedside patient reported that he has been admitted in the [facility] in August 2024 when he had left-sided foot infection and since he has been discharged from the hospital unfortunately he lost follow-up with primary care doctor as well as lost follow-up with  wound care clinic as well.  He has not has got any refill of any blood pressure regimen and insulin  for last 4 months.   Patient reporting that he has ongoing nausea, vomiting and diarrhea for last 1 month.  Also patient noticed right-sided heel wound with malodorous discharge for last 2 weeks.  Patient denies any foot pain.  Denies any fever and chills at home.  Denies any abdominal pain. Patient denies chest pain, orthopnea, PND, dyspnea, headache, change of appetite retroportal and constipation.   Antibiotic Therapy: Anti-infectives (From admission, onward)    Start     Dose/Rate Route Frequency Ordered Stop   04/30/23 1800  nirmatrelvir /ritonavir  (PAXLOVID ) 3 tablet        3 tablet Oral 2 times daily 04/30/23 1621 05/05/23 0820   04/09/23 1345  doxycycline  (VIBRAMYCIN ) 100 mg in dextrose  5 % 250 mL IVPB        100 mg 125 mL/hr over 120 Minutes Intravenous Every 12 hours 04/09/23 1252 04/09/23 1746   04/08/23 1300  levofloxacin  (LEVAQUIN ) IVPB 500 mg  Status:  Discontinued        500 mg 100 mL/hr over 60 Minutes Intravenous On call to O.R. 04/08/23 1229 04/08/23 1648   04/08/23 1300  vancomycin  (VANCOREADY) IVPB 1500 mg/300 mL        1,500 mg 100 mL/hr over 180 Minutes Intravenous On call to O.R. 04/08/23 1229 04/08/23 1433   04/08/23 1237  vancomycin  HCl (VANCOREADY) 1500 MG/300ML IVPB       Note to Pharmacy: Barron Friday D: cabinet override      04/08/23 1237 04/08/23 1439   04/07/23 0800  piperacillin -tazobactam (ZOSYN ) IVPB 3.375 g        3.375 g 12.5  mL/hr over 240 Minutes Intravenous Every 8 hours 04/07/23 0108 04/09/23 2108   04/07/23 0100  doxycycline  (VIBRAMYCIN ) 100 mg in dextrose  5 % 250 mL IVPB  Status:  Discontinued        100 mg 125 mL/hr over 120 Minutes Intravenous Every 12 hours 04/07/23 0057 04/09/23 1252   04/07/23 0000  ceFEPIme  (MAXIPIME ) 2 g in sodium chloride  0.9 % 100 mL IVPB  Status:  Discontinued        2 g 200 mL/hr over 30 Minutes Intravenous Every 8  hours 04/06/23 2349 04/07/23 0108   04/06/23 2330  metroNIDAZOLE  (FLAGYL ) IVPB 500 mg        500 mg 100 mL/hr over 60 Minutes Intravenous  Once 04/06/23 2321 04/07/23 0032   04/06/23 2300  piperacillin -tazobactam (ZOSYN ) IVPB 3.375 g  Status:  Discontinued        3.375 g 100 mL/hr over 30 Minutes Intravenous  Once 04/06/23 2257 04/06/23 2320   04/06/23 2300  vancomycin  (VANCOREADY) IVPB 2000 mg/400 mL  Status:  Discontinued        2,000 mg 200 mL/hr over 120 Minutes Intravenous  Once 04/06/23 2257 04/06/23 2312       Procedures: 04-08-2023 Right BKA  Consultants: [name]    Assessment and Plan: * Sepsis (HCC) 05-11-2023 resolved after treatment and amputation of right foot.  S/P BKA (below knee amputation), right [facility]) - 04-08-2023 05-13-2023 pt has stump shrinker on right LE stump 05-14-2023 has stump shrinkers on both LE stumps 05-18-2023 only has stump shrinker on right LE today. No stump shrinker on Left LE today.  05-20-2023 has stump shrinkers on both LE stumps  CKD (chronic kidney disease), stage II 05-11-2023 Scr 1.03 today. 05-12-2023 check BMP tomorrow since he has started Entresto  yesterday. 05-14-2023 Scr 0.95 yesterday 05-17-2023 Scr 1.0 today. 05-18-2023 plan to check CMP on Wednesday, May 20, 2023. Pt has poor vein and he complains a lot about frequent attempts at venipuncture. Trying to limit lab attempts.  05-20-2023 BUN 8, Scr 0.93, Mg 1.8. stable.  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 05-11-2023 chronic. stable. Start low dose entresto . Scr 1.02. euvolemic. Doesn't need scheduled lasix . 05-12-2023 started on low dose entresto  24-26 bid. Has had 3 doses so far.  Will check BMP in AM. 05-13-2023 awaiting this AM labs. May be able to increase entresto  dose. 05-14-2023 Pt has refused 3 doses of lopressor  thus far(evening dose 05-12-23, both doses 05-13-2023). Also refused both doses of Entresto   yesterday. Do not change any doses until pt has taken current doses with some regularity. 05-15-2023 continues to refuse lopressor  and entresto . Make no dosing changes. If pt starts taking entresto  on a regular basis, check BMP to monitor Scr.  05-16-2023 pt just started taking his lopressor  75 mg BID and Entresto  49-51 bid yesterday.  No changes for now. Monitor BP. Check Scr tomorrow. No indication for diuretics at this point. 05-17-2023 repeat echo shows worsening LVEF of 30%. Was 40% in 08-2021.  Pt has not been compliant with taking CHF GDMT the last several weeks due to oppositional/defiant behavior. He has had some improving compliance with taking Lopressor  and Entresto  over the last 2 days. Will need to see how long this keeps up. Scr is stable. Repeat CMP on Wednesday, May 20, 2023 if he remains compliant with taking CHF GDMT. Remains euvolemic. No indication for diuretics. Given his bilateral LE amputation and his morbid obesity, adding diuretics will be  cumbersome for his daily care as he will likely need a lot of assistance to use the urinal. 05-18-2023 will increase lopressor  to 100 mg bid.  If tolerates this for 2-4 doses, consider changing to Toprol -XL 200 mg qday  After change to Toprol -XL, if BP still has room for titration, can increase Entresto  dose. Currently on Entresto  49-51 bid 05-19-2023 tolerating lopressor  100 mg bid. SBP still >150. Will change over to Toprol -XL 200 mg qday. This will help with his pill fatigue.  Remains on Entresto  49-51 PO BID. Check renal function tomorrow. If renal function stable, will increase Entresto  to 97/103 bid on 05-20-2023 and check renal function on Friday, May 22, 2023.  Pt seems more compliant with taking his meds this week. Seems to have coincided when his wife left the hospital after he yelled at her on 05-15-2023.   05-20-2023 Changed to Toprol -XL 200 mg daily. Scr is normal today. Scr 0.93. BUN 8.  Will also increase Entresto  to 97-103 bid. Repeat BMP  on Saturday, Jan 11 to limit venipunctures due to poor veins. Have not started diuretics due to pt's body habitus and difficulty getting to urinal. Will check BNP on Sat May 23, 2023 to get a baseline BNP while he is euvolemic. Pt has no edema. Current wtg is 288 lbs.  Essential hypertension 05-11-2023 increase lopressor  to 75 mg bid due to uncontrolled BP. Currently on 50 mg bid. 05-12-2023 pt has only had 2 doses of 75 mg lopressor . Will monitor today and adjust. HR 77. He has room to increase lopressor  dose. 05-13-2023 he refused last night's dose of lopressor . HR 101 this AM. May need to change to Toprol -XL to ensure better compliance 05-14-2023 Pt has refused 3 doses of lopressor  thus far(evening dose 05-12-23, both doses 05-13-2023). Do not change lopressor  dose until pt has been taking medication with some regularity. 05-15-2023 continues to refuse lopressor  and entresto . Make no dosing changes. If pt starts taking entresto  on a regular basis, check BMP to monitor Scr. 05-16-2023 pt just started taking his lopressor  75 mg BID and Entresto  49-51 bid yesterday.  No changes for now. Monitor BP. 05-17-2023 improving compliance with taking Lopressor  and Entresto . Scr is stable. Repeat CMP on Wednesday, May 20, 2023 if he remains compliant with taking CHF GDMT. 05-18-2023 will increase lopressor  to 100 mg bid.  If tolerates this for 2-4 doses, consider changing to Toprol -XL 200 mg qday  After change to Toprol -XL, if BP still has room for titration, can increase Entresto  dose. Currently on Entresto  49-51 bid 05-19-2023 tolerating lopressor  100 mg bid. SBP still >150. Will change over to Toprol -XL 200 mg qday. This will help with his pill fatigue.  Remains on Entresto  49-51 PO BID. Check renal function tomorrow. If renal function stable, will increase Entresto  to 97/103 bid on 05-20-2023 and check renal function on Friday, May 22, 2023.  Pt seems more compliant with taking his meds this week. Seems to have  coincided when his wife left the hospital after he yelled at her on 05-15-2023.  05-20-2023 Changed to Toprol -XL 200 mg daily. Scr is normal today. Scr 0.93. BUN 8.  Will also increase Entresto  to 97-103 bid. Repeat BMP on Saturday, Jan 11 to limit venipunctures due to poor veins.  Diarrhea-resolved as of 05/11/2023 Resolved.  CAP (community acquired pneumonia)-resolved as of 05/11/2023 Treated and resolved.  Metabolic acidosis-resolved as of 05/11/2023 Resolved.  Chronic pain 05-19-2023 remains on prn oxycodone . Pt does not want any more addicting pain meds. I told  him that all pain meds were addicting. He c/o mostly of cramping in his legs/stumps. Will increase robaxin  to q4h prn. Currently at q8h prn.  Acute on chronic anemia 05-11-2023 iron  level is somewhat low. Will start nu-iron  150 mg daily. Iron /TIBC/Ferritin/ %Sat    Component Value Date/Time   IRON  34 (L) 04/10/2023 1338   TIBC NOT CALCULATED 04/10/2023 1338   IRONPCTSAT NOT CALCULATED 04/10/2023 1338   05-12-2023 continue nu-iron  150 mg daily.  COVID-19 virus infection 05-11-2023 messaged with infection control RN. Pt's covid test was positive on 04-30-2023. Today is 12 days from PCR. Pt can come out of droplet isolation. 05-12-2023 still has a slight cough. Advised pt and wife that this will get better over time.  resolved  Malnutrition of moderate degree 05-11-2023 seen RD noted from 05-07-2023. Moderate Malnutrition related to chronic illness as evidenced by energy intake < or equal to 75% for > or equal to 1 month, moderate fat depletion, moderate muscle depletion.   Cutaneous abscess of right foot 05-11-2023 resolved after right BKA on 04-08-2023.  Subacute osteomyelitis of right foot (HCC) 05-11-2023 resolved after right BKA on 04-08-2023.  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  Hx of left BKA (HCC) 05-11-2023 chronic. 05-13-2023 has stump shrinker on left LE  stump 05-14-2023 has stump shrinkers on both LE stumps 05-18-2023 only has stump shrinker on right LE today. No stump shrinker on Left LE today.  05-20-2023 has stump shrinkers on both LE stumps  Insulin  dependent type 2 diabetes mellitus (HCC) 05-12-2023 continue with SSI. CBG in adequate ranges.  Acute osteomyelitis of right foot (HCC) 05-11-2023 resolved after right BKA on 04-08-2023.  Obesity, Class II, BMI 35-39.9 05-11-2023 BMI 37.03 05-13-2023 pt has lost 28 lbs since admission. Admit weight of 320 lbs. Current weight of 292.11 lbs.  05-20-2023 current weight 288 lbs.   DVT prophylaxis:   Lovenox    Code Status: Full Code Family Communication: discussed with pt and wife at bedside Disposition Plan: SNF Reason for continuing need for hospitalization: medically stable for DC.  Objective: Vitals:   05/20/23 0427 05/20/23 0500 05/20/23 0813 05/20/23 0827  BP: (!) 155/90  (!) 161/92 (!) 161/92  Pulse: 90  93 93  Resp: 20  16   Temp: 97.8 F (36.6 C)  97.6 F (36.4 C)   TempSrc: Oral  Oral   SpO2: 98%  98%   Weight:  131 kg    Height:        Intake/Output Summary (Last 24 hours) at 05/20/2023 1522 Last data filed at 05/20/2023 0427 Gross per 24 hour  Intake 240 ml  Output 1300 ml  Net -1060 ml   Filed Weights   05/18/23 0424 05/19/23 0500 05/20/23 0500  Weight: 133.7 kg 133.7 kg 131 kg    Examination:  Physical Exam Vitals and nursing note reviewed.  Constitutional:      Appearance: He is obese.  HENT:     Head: Normocephalic and atraumatic.     Nose: Nose normal.  Cardiovascular:     Rate and Rhythm: Normal rate and regular rhythm.  Pulmonary:     Effort: Pulmonary effort is normal.     Breath sounds: Normal breath sounds.  Abdominal:     General: Bowel sounds are normal. There is no distension.     Palpations: Abdomen is soft.     Tenderness: There is no abdominal tenderness.  Musculoskeletal:     Comments: Bilateral BKA Bilateral stump  shrinkers  on both stumps  Skin:    General: Skin is warm and dry.     Capillary Refill: Capillary refill takes less than 2 seconds.  Neurological:     General: No focal deficit present.     Mental Status: He is alert and oriented to person, place, and time.     Data Reviewed: I have personally reviewed following labs and imaging studies  Basic Metabolic Panel: Recent Labs  Lab 05/17/23 0633 05/20/23 0657  NA 139 139  K 3.9 3.9  CL 107 105  CO2 26 27  GLUCOSE 139* 121*  BUN 6 8  CREATININE 1.00 0.93  CALCIUM  7.7* 7.7*  MG  --  1.8   GFR: Estimated Creatinine Clearance: 145.1 mL/min (by C-G formula based on SCr of 0.93 mg/dL). Liver Function Tests: Recent Labs  Lab 05/17/23 0633 05/20/23 0657  AST 13* 12*  ALT 13 12  ALKPHOS 198* 182*  BILITOT 0.6 0.5  PROT 5.6* 5.3*  ALBUMIN  <1.5* <1.5*   BNP (last 3 results) Recent Labs    12/14/22 0839  BNP 197.3*   CBG: Recent Labs  Lab 05/19/23 1200 05/19/23 1624 05/19/23 2043 05/20/23 0849 05/20/23 1141  GLUCAP 95 118* 128* 110* 116*    Scheduled Meds:  docusate sodium   100 mg Oral BID   enoxaparin  (LOVENOX ) injection  70 mg Subcutaneous Daily   feeding supplement  237 mL Oral BID BM   Gerhardt's butt cream   Topical BID   insulin  aspart  0-9 Units Subcutaneous TID WC   lidocaine   2 patch Transdermal Q24H   metoprolol  succinate  200 mg Oral Daily   nutrition supplement (JUVEN)  1 packet Oral BID BM   pantoprazole   40 mg Oral BID   saccharomyces boulardii  250 mg Oral BID   sacubitril -valsartan   1 tablet Oral BID   Continuous Infusions:   LOS: 43 days   Time spent: 40 minutes  Camellia Door, DO  [facility]  05/20/2023, 3:22 PM

## 2023-05-20 NOTE — TOC Progression Note (Signed)
 Transition of Care (TOC) - Progression Note    Patient Details  Name: Alan Tapia. MRN: [id number] Date of Birth: 04-Oct-1975  Transition of Care [facility]) CM/SW Contact  Harlene Sharps, LCSW Phone Number: 05/20/2023, 3:58 PM  Clinical Narrative:    CSW and CM met with pt and spouse at bedside at pt's request. Per pt he was having a situation last week and spoke with the Chaplain, but wanted to schedule a time to meet with CSW to discuss with situation at a later time.  CSW confirmed plan with pt and wife that pt wants to proceed with SNF at [place]. Barrier continues to include disability pending through DSS. Topics of insurance coverage, Medicare, living arrangements and other were discussed at length.  CSW spoke with Viki at DSS who noted she had spoken with pt's mother who is still not able to provide additional support at this time. Viki notes that she will speak with DSS on Friday and will advise CSW if they need any documentation from the hospital. TOC will continue to follow.    Expected Discharge Plan: Skilled Nursing Facility Barriers to Discharge: Insurance Authorization, Continued Medical Work up, SNF Pending bed offer  Expected Discharge Plan and Services In-house Referral: Clinical Social Work     Living arrangements for the past 2 months: Single Family Home Expected Discharge Date: 04/11/23                                     Social Determinants of Health (SDOH) Interventions SDOH Screenings   Food Insecurity: No Food Insecurity (04/07/2023)  Housing: Unknown (04/23/2023)  Recent Concern: Housing - Medium Risk (04/07/2023)  Transportation Needs: No Transportation Needs (04/07/2023)  Recent Concern: Transportation Needs - Unmet Transportation Needs (02/17/2023)   Received from [facility]  Utilities: At Risk (04/07/2023)  Alcohol  Screen: Low Risk  (08/20/2022)  Financial Resource Strain: Patient Unable To Answer (08/20/2022)  Tobacco Use: Low Risk   (04/08/2023)    Readmission Risk Interventions    04/07/2023    2:34 PM  Readmission Risk Prevention Plan  Transportation Screening Complete  PCP or Specialist Appt within 3-5 Days Complete  HRI or Home Care Consult Complete  Social Work Consult for Recovery Care Planning/Counseling Complete  Palliative Care Screening Not Applicable  Medication Review Oceanographer) Complete

## 2023-05-21 ENCOUNTER — Encounter: Payer: Self-pay | Admitting: Orthopedic Surgery

## 2023-05-21 DIAGNOSIS — E11628 Type 2 diabetes mellitus with other skin complications: Secondary | ICD-10-CM | POA: Diagnosis not present

## 2023-05-21 DIAGNOSIS — L089 Local infection of the skin and subcutaneous tissue, unspecified: Secondary | ICD-10-CM | POA: Diagnosis not present

## 2023-05-21 LAB — GLUCOSE, CAPILLARY
Glucose-Capillary: 130 mg/dL — ABNORMAL HIGH (ref 70–99)
Glucose-Capillary: 108 mg/dL — ABNORMAL HIGH (ref 70–99)
Glucose-Capillary: 122 mg/dL — ABNORMAL HIGH (ref 70–99)
Glucose-Capillary: 101 mg/dL — ABNORMAL HIGH (ref 70–99)

## 2023-05-21 NOTE — Progress Notes (Signed)
 Pt blood sugar 130. Pt refused insulin. He stated, "I don't want any insulin."

## 2023-05-21 NOTE — Progress Notes (Signed)
 Nutrition Follow-up  DOCUMENTATION CODES:   Non-severe (moderate) malnutrition in context of chronic illness, Obesity unspecified  INTERVENTION:   - Continue low sodium regular diet with double proteins and snacks daily  - Ensure Enlive po BID, each supplement provides 350 kcal and 20 grams of protein. - Continue MVI with minerals daily - Juven BID, each packet provides 100 kcal, and 2.5 gm protein - Discontinue Mighty Shakes due to patients preference  NUTRITION DIAGNOSIS:   Moderate Malnutrition related to chronic illness as evidenced by energy intake < or equal to 75% for > or equal to 1 month, moderate fat depletion, moderate muscle depletion.  - Still applicable   GOAL:   Patient will meet greater than or equal to 90% of their needs  - Progressing   MONITOR:   PO intake, Supplement acceptance, Weight trends  REASON FOR ASSESSMENT:   Consult Assessment of nutrition requirement/status, Calorie Count  ASSESSMENT:   48 y.o. M presented  from home with N/V/D x 2 weeks and right foot wound, admitted with sepsis. PMH; type 2 diabetes, CHF, CKD 2, BKA of left lower extremity and ongoing diabetic foot infection, s/p RBKA.  4/5 - LBKA 11/27- RBKA  Pt having goo appetite and PO intake. Consuming 75-100% of his meals with double proteins. Does mention that he not receiving double proteins consistently with his meals. Loves the Ensure and has been drinking them consistently as well as the Juven. Not a fan of the mighty shakes and will discontinue. Pt has been eating his snacks and really enjoys them. Noted 100% of breakfast consumed. Does report having occasional N/V but none today. Is having abdominal pain, he states it is from his medication. Reports BM's to be getting better.   Admit weight: 145.2 kg  Current weight: 135 kg    Average Meal Intake: 1/4-1/9: 77% intake x 8 recorded meals  Nutritionally Relevant Medications: Scheduled Meds:  docusate sodium   100 mg Oral  BID   enoxaparin  (LOVENOX ) injection  70 mg Subcutaneous Daily   feeding supplement  237 mL Oral BID BM   Gerhardt's butt cream   Topical BID   insulin  aspart  0-9 Units Subcutaneous TID WC   lidocaine   2 patch Transdermal Q24H   metoprolol  succinate  200 mg Oral Daily   nutrition supplement (JUVEN)  1 packet Oral BID BM   pantoprazole   40 mg Oral BID   saccharomyces boulardii  250 mg Oral BID   sacubitril -valsartan   1 tablet Oral BID    Labs Reviewed: Calcium  7.7,  CBG ranges from 95-174 mg/dL over the last 24 hours HgbA1c 6.8 (03/2023)  Diet Order:   Diet Order             Diet regular Room service appropriate? Yes; Fluid consistency: Thin  Diet effective now           Diet - low sodium heart healthy                   EDUCATION NEEDS:   Education needs have been addressed  Skin:  Skin Assessment: Reviewed RN Assessment Skin Integrity Issues:: Stage II, Incisions, Wound VAC Stage II: [place] Wound Vac: R knee Incisions: R knee  Last BM:  1/9 type 5  Height:   Ht Readings from Last 1 Encounters:  04/08/23 6' 4 (1.93 m)    Weight:   Wt Readings from Last 1 Encounters:  05/21/23 135 kg    Ideal Body Weight:  79.16 kg  BMI:  Body mass index is 36.23 kg/m.  Estimated Nutritional Needs:   Kcal:  2450- 2800 kcal/d  Protein:  140-160 gm  Fluid:  >2.2L    Olivia Kenning, RD Registered Dietitian  See Amion for more information

## 2023-05-21 NOTE — Progress Notes (Signed)
 PROGRESS NOTE    Alan Tapia.  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 He was also having nausea, vomiting and diarrhea. Orthopedic surgery was consulted and patient underwent right BKA 11/27 by Dr. Harden.  Now awaiting placement    Procedures: 04-08-2023 Right BKA  Consultants: [name]   Assessment and Plan: * Sepsis (HCC) -resolved after treatment and amputation of right foot.  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 increase Entresto  to 97-103 bid. Repeat BMP on Saturday, Jan 11 to limit venipunctures due to poor veins. Have not started diuretics due to pt's body habitus and difficulty getting to urinal. Will check BNP on Sat May 23, 2023 to get a baseline BNP while he is euvolemic. Pt has no edema. Current wtg is 288 lbs.  Essential hypertension - Changed to Toprol -XL 200 mg daily. Scr is normal today. Scr 0.93. BUN 8.  Will also increase Entresto  to 97-103 bid. Repeat BMP on Saturday, Jan 11 to limit venipunctures due to poor veins.  Diarrhea-resolved as of 05/11/2023 Resolved.  CAP (community acquired pneumonia)-resolved as of 05/11/2023 Treated and resolved.  Metabolic acidosis-resolved as of 05/11/2023 Resolved.  Chronic pain 05-19-2023 remains on prn oxycodone . Pt does not want any  more addicting pain meds. I told him that all pain meds were addicting. He c/o mostly of cramping in his legs/stumps. Will increase robaxin  to q4h prn. Currently at q8h prn.  Acute on chronic anemia -trend as able  COVID-19 virus infection - covid test was positive on 04-30-2023. Today is 12 days from PCR. Pt can come out of droplet isolation.   Malnutrition of moderate degree 05-11-2023 seen RD noted from 05-07-2023. Moderate Malnutrition related to chronic illness as evidenced by energy intake < or equal to 75% for > or equal to 1 month, moderate fat depletion, moderate muscle depletion.    PVD (peripheral vascular disease) (HCC) - now has bilateral BKA.   Insulin  dependent type 2 diabetes mellitus (HCC) -SSI  Acute osteomyelitis of right foot (HCC) - resolved after right BKA on 04-08-2023.  Obesity, Class II, BMI 35-39.9 Estimated body mass index is 36.23 kg/m as calculated from the following:   Height as of this encounter: 6' 4 (1.93 m).   Weight as of this encounter: 135 kg.        Code Status: Full Code Family Communication: discussed with pt  Disposition Plan: SNF Reason for continuing need for hospitalization: medically stable for DC to SNF  Objective: Vitals:   05/21/23 0432 05/21/23 0434 05/21/23 0748 05/21/23 1207  BP:  (!) 146/79 (!) 157/90 (!) 158/85  Pulse:  75 88 82  Resp:  17 18   Temp:  98.1 F (36.7 C) 98 F (36.7 C)   TempSrc:  Oral Oral   SpO2:  97% 100%   Weight: 135 kg     Height:       No intake or output  data in the 24 hours ending 05/21/23 1253  Filed Weights   05/19/23 0500 05/20/23 0500 05/21/23 0432  Weight: 133.7 kg 131 kg 135 kg    Examination:   General: Appearance:    Obese male in no acute distress     Lungs:     Clear to auscultation bilaterally, respirations unlabored  Heart:    Normal heart rate.  MS:   Below knee amputation of left lower extremity is noted. Below knee amputation of right lower extremity is noted.    Neurologic:   Awake, alert     Data Reviewed: I have personally reviewed following labs and imaging studies  Basic Metabolic Panel: Recent Labs  Lab 05/17/23 0633 05/20/23 0657  NA 139 139  K 3.9 3.9  CL 107 105  CO2 26 27  GLUCOSE 139* 121*  BUN 6 8  CREATININE 1.00 0.93  CALCIUM  7.7* 7.7*  MG  --  1.8   GFR: Estimated Creatinine Clearance: 147.4 mL/min (by C-G formula based on SCr of 0.93 mg/dL). Liver Function Tests: Recent Labs  Lab 05/17/23 0633 05/20/23 0657  AST 13* 12*  ALT 13 12  ALKPHOS 198* 182*  BILITOT 0.6 0.5  PROT 5.6* 5.3*  ALBUMIN  <1.5* <1.5*   BNP (last 3 results) Recent Labs    12/14/22 0839  BNP 197.3*   CBG: Recent Labs  Lab 05/20/23 1141 05/20/23 1817 05/20/23 2125 05/21/23 0744 05/21/23 1216  GLUCAP 116* 174* 187* 130* 108*    Scheduled Meds:  docusate sodium   100 mg Oral BID   enoxaparin  (LOVENOX ) injection  70 mg Subcutaneous Daily   feeding supplement  237 mL Oral BID BM   Gerhardt's butt cream   Topical BID   insulin  aspart  0-9 Units Subcutaneous TID WC   lidocaine   2 patch Transdermal Q24H   metoprolol  succinate  200 mg Oral Daily   nutrition supplement (JUVEN)  1 packet Oral BID BM   pantoprazole   40 mg Oral BID   saccharomyces boulardii  250 mg Oral BID   sacubitril -valsartan   1 tablet Oral BID   Continuous Infusions:   LOS: 44 days   Time spent: 40 minutes  Harlene RAYMOND Bowl, DO  [facility]  05/21/2023, 12:53 PM

## 2023-05-21 NOTE — Progress Notes (Signed)
 Pt blood sugar 122. Pt continues to refuse insulin.

## 2023-05-21 NOTE — Progress Notes (Signed)
 Physical Therapy Treatment Patient Details Name: Alan Tapia. MRN: [id number] DOB: 11-May-1976 Today's Date: 05/21/2023   History of Present Illness The pt is a 48 yo male presenting 11/25 with nausea, vomiting, and fever as well as R foot wound. 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    PT Comments  Pt declining practicing anterior posterior transfer, but agreeable to transfer out of bed via maxi move lift. Pt noted to have bowel incontinence, so prior to transition, worked with RN on bed mobility for rolling for peri care and lift pad placement. Pt able to wipe backside from sidelying position. Worked on seated exercises for bilateral residual limb strengthening once in chair. Despite geomat cushions, pt reporting buttocks discomfort in chair. Encouraged to sit up for one hour. Patient will benefit from continued inpatient follow up therapy, <3 hours/day.    If plan is discharge home, recommend the following: Two people to help with walking and/or transfers;Assist for transportation;Assistance with cooking/housework;Help with stairs or ramp for entrance   Can travel by private vehicle     No  Equipment Recommendations  Hoyer lift;Wheelchair cushion (measurements PT);Wheelchair (measurements PT);Hospital bed    Recommendations for Other Services       Precautions / Restrictions Precautions Precautions: Fall Restrictions Weight Bearing Restrictions Per Provider Order: Yes RLE Weight Bearing Per Provider Order: Non weight bearing     Mobility  Bed Mobility Overal bed mobility: Needs Assistance Bed Mobility: Rolling Rolling: Min assist, Mod assist         General bed mobility comments: Min-modA for multiple rolls to R/L for peri care, pad change, and placement of maxi lift pad    Transfers Overall transfer  level: Needs assistance Equipment used: Ambulation equipment used Transfers: Bed to chair/wheelchair/BSC             General transfer comment: Maxisky transfer from bed to chair. Pt brought into chair laterally via lift due to poor tolerance for chair position Transfer via Lift Equipment: Maxisky  Ambulation/Gait                   Stairs             Wheelchair Mobility     Tilt Bed    Modified Rankin (Stroke Patients Only)       Balance                                            Cognition Arousal: Alert Behavior During Therapy: WFL for tasks assessed/performed Overall Cognitive Status: Within Functional Limits for tasks assessed                                          Exercises Amputee Exercises Hip ABduction/ADduction: Both, 20 reps, Seated Hip Flexion/Marching: Both, 10 reps, Seated Knee Extension: Both, 20 reps, Seated    General Comments        Pertinent Vitals/Pain Pain Assessment Pain Assessment: 0-10 Pain Score: 10-Worst pain ever Pain Location: R residual limb Pain Descriptors / Indicators: Discomfort Pain Intervention(s): Limited activity within patient's tolerance, Monitored during session, RN gave pain meds  during session    Home Living                          Prior Function            PT Goals (current goals can now be found in the care plan section) Acute Rehab PT Goals Patient Stated Goal: get bil prosthetics Potential to Achieve Goals: Fair    Frequency    Min 1X/week      PT Plan      Co-evaluation              AM-PAC PT 6 Clicks Mobility   Outcome Measure  Help needed turning from your back to your side while in a flat bed without using bedrails?: A Lot Help needed moving from lying on your back to sitting on the side of a flat bed without using bedrails?: A Lot Help needed moving to and from a bed to a chair (including a wheelchair)?: Total Help  needed standing up from a chair using your arms (e.g., wheelchair or bedside chair)?: Total Help needed to walk in hospital room?: Total Help needed climbing 3-5 steps with a railing? : Total 6 Click Score: 8    End of Session   Activity Tolerance: Patient tolerated treatment well Patient left: in chair;with call bell/phone within reach Nurse Communication: Need for lift equipment;Mobility status PT Visit Diagnosis: Other abnormalities of gait and mobility (R26.89);Muscle weakness (generalized) (M62.81);Pain Pain - Right/Left: Right Pain - part of body: Leg     Time: 8572-8482 PT Time Calculation (min) (ACUTE ONLY): 50 min  Charges:    $Therapeutic Activity: 38-52 mins PT General Charges $$ ACUTE PT VISIT: 1 Visit                     [name], PT, DPT Acute Rehabilitation Services Office [phone]    [name] 05/21/2023, 4:08 PM

## 2023-05-21 NOTE — Plan of Care (Signed)
  Problem: Clinical Measurements: Goal: Signs and symptoms of infection will decrease Outcome: Progressing   Problem: Respiratory: Goal: Ability to maintain adequate ventilation will improve Outcome: Progressing   Problem: Education: Goal: Knowledge of General Education information will improve Description: Including pain rating scale, medication(s)/side effects and non-pharmacologic comfort measures Outcome: Progressing   Problem: Health Behavior/Discharge Planning: Goal: Ability to manage health-related needs will improve Outcome: Progressing   Problem: Clinical Measurements: Goal: Diagnostic test results will improve Outcome: Progressing Goal: Cardiovascular complication will be avoided Outcome: Progressing   Problem: Activity: Goal: Risk for activity intolerance will decrease Outcome: Progressing

## 2023-05-21 NOTE — Progress Notes (Signed)
 Mobility Specialist: Progress Note   05/21/23 1623  Mobility  Activity Transferred from chair to bed  Level of Assistance Total care  Assistive Device MaxiMove  RLE Weight Bearing Per Provider Order NWB  Activity Response Tolerated well  Mobility Referral Yes  Mobility visit 1 Mobility  Mobility Specialist Start Time (ACUTE ONLY) 1600  Mobility Specialist Stop Time (ACUTE ONLY) 1619  Mobility Specialist Time Calculation (min) (ACUTE ONLY) 19 min    Pt requested to get back to bed - received in chair. Transferred via maximove with occasional assistance from wife. No verbal complaints but visually in pain/discomfort. Left in bed with all needs met, call bell in reach.   Ileana Lute Mobility Specialist Please contact via SecureChat or Rehab office at [phone]

## 2023-05-22 DIAGNOSIS — L089 Local infection of the skin and subcutaneous tissue, unspecified: Secondary | ICD-10-CM | POA: Diagnosis not present

## 2023-05-22 DIAGNOSIS — E11628 Type 2 diabetes mellitus with other skin complications: Secondary | ICD-10-CM | POA: Diagnosis not present

## 2023-05-22 LAB — GLUCOSE, CAPILLARY: Glucose-Capillary: 138 mg/dL — ABNORMAL HIGH (ref 70–99)

## 2023-05-22 NOTE — Plan of Care (Signed)
  Problem: Pain Management: Goal: Pain level will decrease with appropriate interventions Outcome: Progressing   Problem: Skin Integrity: Goal: Demonstration of wound healing without infection will improve Outcome: Progressing   Problem: Education: Goal: Ability to describe self-care measures that may prevent or decrease complications (Diabetes Survival Skills Education) will improve Outcome: Progressing Goal: Individualized Educational Video(s) Outcome: Progressing   Problem: Coping: Goal: Ability to adjust to condition or change in health will improve Outcome: Progressing   Problem: Fluid Volume: Goal: Ability to maintain a balanced intake and output will improve Outcome: Progressing   Problem: Health Behavior/Discharge Planning: Goal: Ability to identify and utilize available resources and services will improve Outcome: Progressing Goal: Ability to manage health-related needs will improve Outcome: Progressing   Problem: Metabolic: Goal: Ability to maintain appropriate glucose levels will improve Outcome: Progressing   Problem: Nutritional: Goal: Maintenance of adequate nutrition will improve Outcome: Progressing Goal: Progress toward achieving an optimal weight will improve Outcome: Progressing   Problem: Skin Integrity: Goal: Risk for impaired skin integrity will decrease Outcome: Progressing   Problem: Tissue Perfusion: Goal: Adequacy of tissue perfusion will improve Outcome: Progressing   Problem: Education: Goal: Knowledge of risk factors and measures for prevention of condition will improve Outcome: Progressing   Problem: Coping: Goal: Psychosocial and spiritual needs will be supported Outcome: Progressing   Problem: Respiratory: Goal: Will maintain a patent airway Outcome: Progressing Goal: Complications related to the disease process, condition or treatment will be avoided or minimized Outcome: Progressing

## 2023-05-22 NOTE — Progress Notes (Signed)
 PROGRESS NOTE    Alan Tapia.  FMW:[id number] DOB: [birth year] DOA: 04/06/2023 PCP: Patient, No Pcp Per  Subjective: Await SNF placement  Hospital Course: 48 year old male with past medical history significant for left lower extremity BKA, dilated cardiomyopathy, history of combined systolic and diastolic heart failure with EF 35 to 40%, hypertension, insulin -dependent diabetes, peripheral neuropathy, hyperlipidemia who presented to the emergency department on 04/06/2023 with complaints of odor, wound of his foot. He was also having nausea, vomiting and diarrhea. Orthopedic surgery was consulted and patient underwent right BKA 11/27 by Dr. Harden.  Now awaiting placement    Procedures: 04-08-2023 Right BKA  Consultants: [name]   Assessment and Plan: * Sepsis (HCC) -resolved after treatment and amputation of right foot.  S/P BKA (below knee amputation), right 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 increase Entresto  to 97-103 bid. Repeat BMP on Saturday, Jan 11 to limit venipunctures due to poor veins. Have not started diuretics due to pt's body habitus and difficulty getting to urinal. Will check BNP on Sat May 23, 2023 to get a baseline BNP while he is euvolemic. Pt has no edema. Current wtg is 288 lbs.  Essential hypertension - Changed to Toprol -XL 200 mg daily. Scr is normal today. Scr 0.93. BUN 8.  Will also increase Entresto  to 97-103 bid. Repeat BMP on Saturday, Jan 11 to limit venipunctures due to poor veins.  Diarrhea-resolved as of 05/11/2023 Resolved.  CAP (community acquired pneumonia)-resolved as of 05/11/2023 Treated and resolved.  Metabolic acidosis-resolved as of 05/11/2023 Resolved.  Chronic pain 05-19-2023 remains on prn oxycodone . Pt does not want any  more addicting pain meds. I told him that all pain meds were addicting. He c/o mostly of cramping in his legs/stumps. Will increase robaxin  to q4h prn. Currently at q8h prn.  Acute on chronic anemia -trend as able  COVID-19 virus infection - covid test was positive on 04-30-2023. Today is 12 days from PCR. Pt can come out of droplet isolation.   Malnutrition of moderate degree 05-11-2023 seen RD noted from 05-07-2023. Moderate Malnutrition related to chronic illness as evidenced by energy intake < or equal to 75% for > or equal to 1 month, moderate fat depletion, moderate muscle depletion.    PVD (peripheral vascular disease) (HCC) - now has bilateral BKA.   Insulin  dependent type 2 diabetes mellitus (HCC) -d/c SSI for now as blood sugars well controlled  Acute osteomyelitis of right foot (HCC) - resolved after right BKA on 04-08-2023.  Obesity, Class II, BMI 35-39.9 Estimated body mass index is 36.23 kg/m as calculated from the following:   Height as of this encounter: 6' 4 (1.93 m).   Weight as of this encounter: 135 kg.        Code Status: Full Code Family Communication: discussed with pt  Disposition Plan: SNF Reason for continuing need for hospitalization: medically stable for DC to SNF  Objective: Vitals:   05/21/23 2023 05/22/23 0543 05/22/23 0545 05/22/23 0724  BP: (!) 149/82 (!) 146/89  (!) 151/84  Pulse: 76 82  77  Resp: 18 18  16   Temp: 98 F (36.7 C) 97.9 F (36.6 C)  97.9 F (36.6 C)  TempSrc:    Oral  SpO2: 99% 98%  96%  Weight:   135 kg  Height:       No intake or output data in the 24 hours ending 05/22/23 1150  Filed Weights   05/20/23 0500 05/21/23 0432 05/22/23 0545  Weight: 131 kg 135 kg 135 kg    Examination:   General: Appearance:    Obese male in no acute distress     Lungs:     Clear to auscultation bilaterally, respirations unlabored  Heart:    Normal heart rate.  MS:   Below knee amputation of left lower extremity is  noted. Below knee amputation of right lower extremity is noted.   Neurologic:   Awake, alert     Data Reviewed: I have personally reviewed following labs and imaging studies  Basic Metabolic Panel: Recent Labs  Lab 05/17/23 0633 05/20/23 0657  NA 139 139  K 3.9 3.9  CL 107 105  CO2 26 27  GLUCOSE 139* 121*  BUN 6 8  CREATININE 1.00 0.93  CALCIUM  7.7* 7.7*  MG  --  1.8   GFR: Estimated Creatinine Clearance: 147.4 mL/min (by C-G formula based on SCr of 0.93 mg/dL). Liver Function Tests: Recent Labs  Lab 05/17/23 0633 05/20/23 0657  AST 13* 12*  ALT 13 12  ALKPHOS 198* 182*  BILITOT 0.6 0.5  PROT 5.6* 5.3*  ALBUMIN  <1.5* <1.5*   BNP (last 3 results) Recent Labs    12/14/22 0839  BNP 197.3*   CBG: Recent Labs  Lab 05/21/23 0744 05/21/23 1216 05/21/23 1747 05/21/23 2024 05/22/23 0745  GLUCAP 130* 108* 122* 101* 138*    Scheduled Meds:  docusate sodium   100 mg Oral BID   enoxaparin  (LOVENOX ) injection  70 mg Subcutaneous Daily   feeding supplement  237 mL Oral BID BM   Gerhardt's butt cream   Topical BID   lidocaine   2 patch Transdermal Q24H   metoprolol  succinate  200 mg Oral Daily   nutrition supplement (JUVEN)  1 packet Oral BID BM   pantoprazole   40 mg Oral BID   saccharomyces boulardii  250 mg Oral BID   sacubitril -valsartan   1 tablet Oral BID   Continuous Infusions:   LOS: 45 days   Time spent: 40 minutes  Harlene RAYMOND Bowl, DO  [facility]  05/22/2023, 11:50 AM

## 2023-05-23 DIAGNOSIS — L089 Local infection of the skin and subcutaneous tissue, unspecified: Secondary | ICD-10-CM | POA: Diagnosis not present

## 2023-05-23 DIAGNOSIS — E11628 Type 2 diabetes mellitus with other skin complications: Secondary | ICD-10-CM | POA: Diagnosis not present

## 2023-05-23 LAB — BASIC METABOLIC PANEL
Anion gap: 3 — ABNORMAL LOW (ref 5–15)
BUN: 7 mg/dL (ref 6–20)
CO2: 26 mmol/L (ref 22–32)
Calcium: 7.4 mg/dL — ABNORMAL LOW (ref 8.9–10.3)
Chloride: 109 mmol/L (ref 98–111)
Creatinine, Ser: 1.07 mg/dL (ref 0.61–1.24)
GFR, Estimated: >60 mL/min (ref >60)
Glucose, Bld: 163 mg/dL — ABNORMAL HIGH (ref 70–99)
Potassium: 4 mmol/L (ref 3.5–5.1)
Sodium: 138 mmol/L (ref 135–145)

## 2023-05-23 LAB — MAGNESIUM: Magnesium: 1.8 mg/dL (ref 1.7–2.4)

## 2023-05-23 LAB — BRAIN NATRIURETIC PEPTIDE: B Natriuretic Peptide: 1520.2 pg/mL — ABNORMAL HIGH (ref 0.0–100.0)

## 2023-05-23 MED ORDER — SENNA 8.6 MG PO TABS
1.0000 | ORAL_TABLET | Freq: Every day | ORAL | Status: DC | PRN
  Administered 2023-05-30: 8.6 mg via ORAL
  Filled 2023-05-23: qty 1

## 2023-05-23 MED ORDER — POLYETHYLENE GLYCOL 3350 17 G PO PACK
17.0000 g | PACK | Freq: Every day | ORAL | Status: DC
  Administered 2023-05-23 – 2023-05-26 (×4): 17 g via ORAL
  Filled 2023-05-23 (×4): qty 1

## 2023-05-23 MED ORDER — MELATONIN 3 MG PO TABS
3.0000 mg | ORAL_TABLET | Freq: Every day | ORAL | Status: DC
  Administered 2023-05-23: 3 mg via ORAL
  Filled 2023-05-23: qty 1

## 2023-05-23 NOTE — Progress Notes (Signed)
 PROGRESS NOTE    Alan Tapia.  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 He was also having nausea, vomiting and diarrhea. Orthopedic surgery was consulted and patient underwent right BKA 11/27 by Dr. Harden.  Now awaiting placement    Procedures: 04-08-2023 Right BKA  Consultants: [name]   Assessment and Plan: * Sepsis (HCC) -resolved after treatment and amputation of right foot.  S/P BKA (below knee amputation), right 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 increase Entresto  to 97-103 bid.  -adjust meds as able  Essential hypertension - Changed to Toprol -XL 200 mg daily. Scr is normal today. Scr 0.93. BUN 8.  Will also increase Entresto  to 97-103 bid.   Diarrhea-resolved as of 05/11/2023 Resolved. Now constipated  CAP (community acquired pneumonia)-resolved as of 05/11/2023 Treated and resolved.  Metabolic acidosis-resolved as of 05/11/2023 Resolved.  Chronic pain -continue pain meds PRN with bowel regimen  Acute on chronic anemia -trend as able  COVID-19 virus infection - covid test was positive on 04-30-2023. Today is 12 days from PCR. Pt can come out of droplet isolation.   Malnutrition of moderate degree 05-11-2023 seen RD noted from 05-07-2023. Moderate Malnutrition  related to chronic illness as evidenced by energy intake < or equal to 75% for > or equal to 1 month, moderate fat depletion, moderate muscle depletion.    PVD (peripheral vascular disease) (HCC) - now has bilateral BKA.   Insulin  dependent type 2 diabetes mellitus (HCC) -d/c SSI for now as blood sugars well controlled  Acute osteomyelitis of right foot (HCC) - resolved after right BKA on 04-08-2023.  Obesity, Class II, BMI 35-39.9 Estimated body mass index is 35.72 kg/m as calculated from the following:   Height as of this encounter: 6' 4 (1.93 m).   Weight as of this encounter: 133.1 kg.        Code Status: Full Code Family Communication: discussed with pt and wife at bedside Disposition Plan: SNF Reason for continuing need for hospitalization: medically stable for DC to SNF  Objective: Vitals:   05/22/23 2109 05/23/23 0442 05/23/23 0444 05/23/23 1010  BP: (!) 148/79 136/76  (!) 146/79  Pulse: 76 72  76  Resp: 18 18  18   Temp: 97.9 F (36.6 C) 98.1 F (36.7 C)  98.6 F (37 C)  TempSrc: Oral Oral  Oral  SpO2: 99% 96%  96%  Weight:   133.1 kg   Height:        Intake/Output Summary (Last 24 hours) at 05/23/2023 1110 Last data filed at 05/23/2023 1000 Gross per 24 hour  Intake 0 ml  Output 800 ml  Net -800 ml    Filed Weights   05/21/23 0432 05/22/23 0545 05/23/23 0444  Weight: 135 kg 135 kg 133.1 kg    Examination:   General: Appearance:    Obese male in no acute distress, on [place]  Lungs:    respirations unlabored  Heart:    Normal heart rate.   MS:   Below knee amputation of left lower extremity is noted. Below knee amputation of right lower extremity is noted.   Neurologic:   Awake, alert     Data Reviewed: I have personally reviewed following labs and imaging studies  Basic Metabolic Panel: Recent Labs  Lab 05/17/23 0633 05/20/23 0657 05/23/23 0449  NA 139 139 138  K 3.9 3.9 4.0  CL 107 105 109  CO2 26 27 26   GLUCOSE 139* 121* 163*   BUN 6 8 7   CREATININE 1.00 0.93 1.07  CALCIUM  7.7* 7.7* 7.4*  MG  --  1.8 1.8   GFR: Estimated Creatinine Clearance: 127.1 mL/min (by C-G formula based on SCr of 1.07 mg/dL). Liver Function Tests: Recent Labs  Lab 05/17/23 0633 05/20/23 0657  AST 13* 12*  ALT 13 12  ALKPHOS 198* 182*  BILITOT 0.6 0.5  PROT 5.6* 5.3*  ALBUMIN  <1.5* <1.5*   BNP (last 3 results) Recent Labs    12/14/22 0839 05/23/23 0449  BNP 197.3* 1,520.2*   CBG: Recent Labs  Lab 05/21/23 0744 05/21/23 1216 05/21/23 1747 05/21/23 2024 05/22/23 0745  GLUCAP 130* 108* 122* 101* 138*    Scheduled Meds:  docusate sodium   100 mg Oral BID   enoxaparin  (LOVENOX ) injection  70 mg Subcutaneous Daily   feeding supplement  237 mL Oral BID BM   Gerhardt's butt cream   Topical BID   lidocaine   2 patch Transdermal Q24H   metoprolol  succinate  200 mg Oral Daily   nutrition supplement (JUVEN)  1 packet Oral BID BM   pantoprazole   40 mg Oral BID   polyethylene glycol  17 g Oral Daily   saccharomyces boulardii  250 mg Oral BID   sacubitril -valsartan   1 tablet Oral BID   Continuous Infusions:   LOS: 46 days   Time spent: 40 minutes  Harlene RAYMOND Bowl, DO  [facility]  05/23/2023, 11:10 AM

## 2023-05-23 NOTE — Plan of Care (Signed)
  Problem: Clinical Measurements: Goal: Diagnostic test results will improve Outcome: Progressing Goal: Signs and symptoms of infection will decrease Outcome: Progressing   Problem: Respiratory: Goal: Ability to maintain adequate ventilation will improve Outcome: Progressing   Problem: Education: Goal: Knowledge of General Education information will improve Description: Including pain rating scale, medication(s)/side effects and non-pharmacologic comfort measures Outcome: Progressing   Problem: Nutrition: Goal: Adequate nutrition will be maintained Outcome: Progressing   Problem: Coping: Goal: Level of anxiety will decrease Outcome: Progressing

## 2023-05-23 NOTE — Plan of Care (Signed)
 Problem: Fluid Volume: Goal: Hemodynamic stability will improve Outcome: Progressing   Problem: Clinical Measurements: Goal: Diagnostic test results will improve Outcome: Progressing Goal: Signs and symptoms of infection will decrease Outcome: Progressing   Problem: Respiratory: Goal: Ability to maintain adequate ventilation will improve Outcome: Progressing   Problem: Education: Goal: Knowledge of General Education information will improve Description: Including pain rating scale, medication(s)/side effects and non-pharmacologic comfort measures Outcome: Progressing   Problem: Health Behavior/Discharge Planning: Goal: Ability to manage health-related needs will improve Outcome: Progressing   Problem: Clinical Measurements: Goal: Ability to maintain clinical measurements within normal limits will improve Outcome: Progressing Goal: Will remain free from infection Outcome: Progressing Goal: Diagnostic test results will improve Outcome: Progressing Goal: Respiratory complications will improve Outcome: Progressing Goal: Cardiovascular complication will be avoided Outcome: Progressing   Problem: Activity: Goal: Risk for activity intolerance will decrease Outcome: Progressing   Problem: Nutrition: Goal: Adequate nutrition will be maintained Outcome: Progressing   Problem: Coping: Goal: Level of anxiety will decrease Outcome: Progressing   Problem: Elimination: Goal: Will not experience complications related to bowel motility Outcome: Progressing Goal: Will not experience complications related to urinary retention Outcome: Progressing   Problem: Pain Management: Goal: General experience of comfort will improve Outcome: Progressing   Problem: Safety: Goal: Ability to remain free from injury will improve Outcome: Progressing   Problem: Skin Integrity: Goal: Risk for impaired skin integrity will decrease Outcome: Progressing   Problem: Activity: Goal: Ability  to tolerate increased activity will improve Outcome: Progressing   Problem: Clinical Measurements: Goal: Ability to maintain a body temperature in the normal range will improve Outcome: Progressing   Problem: Respiratory: Goal: Ability to maintain adequate ventilation will improve Outcome: Progressing Goal: Ability to maintain a clear airway will improve Outcome: Progressing   Problem: Education: Goal: Knowledge of the prescribed therapeutic regimen will improve Outcome: Progressing Goal: Ability to verbalize activity precautions or restrictions will improve Outcome: Progressing Goal: Understanding of discharge needs will improve Outcome: Progressing   Problem: Activity: Goal: Ability to perform//tolerate increased activity and mobilize with assistive devices will improve Outcome: Progressing   Problem: Clinical Measurements: Goal: Postoperative complications will be avoided or minimized Outcome: Progressing   Problem: Self-Care: Goal: Ability to meet self-care needs will improve Outcome: Progressing   Problem: Self-Concept: Goal: Ability to maintain and perform role responsibilities to the fullest extent possible will improve Outcome: Progressing   Problem: Education: Goal: Knowledge of General Education information will improve Description: Including pain rating scale, medication(s)/side effects and non-pharmacologic comfort measures Outcome: Progressing   Problem: Health Behavior/Discharge Planning: Goal: Ability to manage health-related needs will improve Outcome: Progressing   Problem: Clinical Measurements: Goal: Ability to maintain clinical measurements within normal limits will improve Outcome: Progressing Goal: Will remain free from infection Outcome: Progressing Goal: Diagnostic test results will improve Outcome: Progressing Goal: Respiratory complications will improve Outcome: Progressing Goal: Cardiovascular complication will be avoided Outcome:  Progressing   Problem: Activity: Goal: Risk for activity intolerance will decrease Outcome: Progressing   Problem: Nutrition: Goal: Adequate nutrition will be maintained Outcome: Progressing   Problem: Coping: Goal: Level of anxiety will decrease Outcome: Progressing   Problem: Elimination: Goal: Will not experience complications related to bowel motility Outcome: Progressing Goal: Will not experience complications related to urinary retention Outcome: Progressing   Problem: Pain Management: Goal: General experience of comfort will improve Outcome: Progressing   Problem: Safety: Goal: Ability to remain free from injury will improve Outcome: Progressing   Problem: Skin Integrity: Goal: Risk for  impaired skin integrity will decrease Outcome: Progressing

## 2023-05-24 DIAGNOSIS — M86171 Other acute osteomyelitis, right ankle and foot: Secondary | ICD-10-CM | POA: Diagnosis not present

## 2023-05-24 MED ORDER — MELATONIN 5 MG PO TABS
5.0000 mg | ORAL_TABLET | Freq: Every day | ORAL | Status: DC
  Administered 2023-05-24 – 2023-06-28 (×35): 5 mg via ORAL
  Filled 2023-05-24 (×36): qty 1

## 2023-05-24 NOTE — Progress Notes (Signed)
 PROGRESS NOTE    Alan Tapia.  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 He was also having nausea, vomiting and diarrhea. Orthopedic surgery was consulted and patient underwent right BKA 11/27 by Dr. Harden.  Now awaiting placement    Procedures: 04-08-2023 Right BKA  Consultants: [name]    Assessment and Plan: * Sepsis (HCC) -resolved after treatment and amputation of right foot.  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 increase Entresto  to 97-103 bid.  -adjust meds as able  Essential hypertension - Changed to Toprol -XL 200 mg daily. Scr is normal today. Scr 0.93. BUN 8.  Will also increase Entresto  to 97-103 bid.   Diarrhea-resolved as of 05/11/2023 Resolved. Now constipated-- bowel regimen  CAP (community acquired pneumonia)-resolved as of 05/11/2023 Treated and resolved.  Metabolic acidosis-resolved as of 05/11/2023 Resolved.  Chronic pain -continue pain meds PRN with bowel regimen  Acute on chronic anemia -trend as able  COVID-19 virus infection - covid test was positive on 04-30-2023. Today is 12 days from PCR. Pt can come out of droplet isolation.   Malnutrition of moderate degree 05-11-2023 seen RD  noted from 05-07-2023. Moderate Malnutrition related to chronic illness as evidenced by energy intake < or equal to 75% for > or equal to 1 month, moderate fat depletion, moderate muscle depletion.    PVD (peripheral vascular disease) (HCC) - now has bilateral BKA.   Insulin  dependent type 2 diabetes mellitus (HCC) -d/c SSI for now as blood sugars well controlled  Acute osteomyelitis of right foot (HCC) - resolved after right BKA on 04-08-2023.  Obesity, Class II, BMI 35-39.9 Estimated body mass index is 35.72 kg/m as calculated from the following:   Height as of this encounter: 6' 4 (1.93 m).   Weight as of this encounter: 133.1 kg.        Code Status: Full Code Family Communication: discussed with pt and wife at bedside Disposition Plan: SNF Reason for continuing need for hospitalization: medically stable for DC to SNF  Objective: Vitals:   05/23/23 1612 05/23/23 2157 05/24/23 0136 05/24/23 0818  BP: (!) 145/78 (!) 154/88 (!) 149/82 (!) 149/83  Pulse: 70 80 76 82  Resp: 16 17 20 19   Temp: 98.7 F (37.1 C) 98 F (36.7 C) 97.9 F (36.6 C) 97.6 F (36.4 C)  TempSrc: Oral Oral Oral Oral  SpO2: 100% 100% 100% 99%  Weight:      Height:        Intake/Output Summary (Last 24 hours) at 05/24/2023 1035 Last data filed at 05/23/2023 2200 Gross per 24 hour  Intake 480 ml  Output 400 ml  Net 80 ml    Filed Weights   05/21/23 0432 05/22/23 0545 05/23/23 0444  Weight: 135 kg 135 kg 133.1 kg    Examination:   General: Appearance:  Obese male in no acute distress, on [place]     Lungs:    respirations unlabored  Heart:    Normal heart rate.   MS:   Below knee amputation of left lower extremity is noted. Below knee amputation of right lower extremity is noted.   Neurologic:   Awake, alert     Data Reviewed: I have personally reviewed following labs and imaging studies  Basic Metabolic Panel: Recent Labs  Lab 05/20/23 0657 05/23/23 0449  NA 139 138  K 3.9 4.0   CL 105 109  CO2 27 26  GLUCOSE 121* 163*  BUN 8 7  CREATININE 0.93 1.07  CALCIUM  7.7* 7.4*  MG 1.8 1.8   GFR: Estimated Creatinine Clearance: 127.1 mL/min (by C-G formula based on SCr of 1.07 mg/dL). Liver Function Tests: Recent Labs  Lab 05/20/23 0657  AST 12*  ALT 12  ALKPHOS 182*  BILITOT 0.5  PROT 5.3*  ALBUMIN  <1.5*   BNP (last 3 results) Recent Labs    12/14/22 0839 05/23/23 0449  BNP 197.3* 1,520.2*   CBG: Recent Labs  Lab 05/21/23 0744 05/21/23 1216 05/21/23 1747 05/21/23 2024 05/22/23 0745  GLUCAP 130* 108* 122* 101* 138*    Scheduled Meds:  docusate sodium   100 mg Oral BID   enoxaparin  (LOVENOX ) injection  70 mg Subcutaneous Daily   feeding supplement  237 mL Oral BID BM   Gerhardt's butt cream   Topical BID   lidocaine   2 patch Transdermal Q24H   melatonin  3 mg Oral QHS   metoprolol  succinate  200 mg Oral Daily   nutrition supplement (JUVEN)  1 packet Oral BID BM   pantoprazole   40 mg Oral BID   polyethylene glycol  17 g Oral Daily   saccharomyces boulardii  250 mg Oral BID   sacubitril -valsartan   1 tablet Oral BID   Continuous Infusions:   LOS: 47 days   Time spent: 40 minutes  Harlene RAYMOND Bowl, DO  [facility]  05/24/2023, 10:35 AM

## 2023-05-24 NOTE — Plan of Care (Signed)
 Patient alert and oriented. No acute distress noted. Medicated for pain as per PRN orders with positive effect. Wife remains at bedside. Will continue to monitor.   Problem: Fluid Volume: Goal: Hemodynamic stability will improve Outcome: Progressing   Problem: Clinical Measurements: Goal: Diagnostic test results will improve Outcome: Progressing Goal: Signs and symptoms of infection will decrease Outcome: Progressing   Problem: Respiratory: Goal: Ability to maintain adequate ventilation will improve Outcome: Progressing   Problem: Education: Goal: Knowledge of General Education information will improve Description: Including pain rating scale, medication(s)/side effects and non-pharmacologic comfort measures Outcome: Progressing   Problem: Health Behavior/Discharge Planning: Goal: Ability to manage health-related needs will improve Outcome: Progressing   Problem: Clinical Measurements: Goal: Ability to maintain clinical measurements within normal limits will improve Outcome: Progressing Goal: Will remain free from infection Outcome: Progressing Goal: Diagnostic test results will improve Outcome: Progressing Goal: Respiratory complications will improve Outcome: Progressing Goal: Cardiovascular complication will be avoided Outcome: Progressing   Problem: Activity: Goal: Risk for activity intolerance will decrease Outcome: Progressing   Problem: Nutrition: Goal: Adequate nutrition will be maintained Outcome: Progressing   Problem: Coping: Goal: Level of anxiety will decrease Outcome: Progressing   Problem: Elimination: Goal: Will not experience complications related to bowel motility Outcome: Progressing Goal: Will not experience complications related to urinary retention Outcome: Progressing   Problem: Pain Management: Goal: General experience of comfort will improve Outcome: Progressing   Problem: Safety: Goal: Ability to remain free from injury will  improve Outcome: Progressing   Problem: Skin Integrity: Goal: Risk for impaired skin integrity will decrease Outcome: Progressing   Problem: Activity: Goal: Ability to tolerate increased activity will improve Outcome: Progressing   Problem: Clinical Measurements: Goal: Ability to maintain a body temperature in the normal range will improve Outcome: Progressing   Problem: Respiratory: Goal: Ability to maintain adequate ventilation will improve Outcome: Progressing Goal: Ability to maintain a clear airway will improve Outcome: Progressing   Problem: Education: Goal: Knowledge of the prescribed therapeutic regimen will improve Outcome: Progressing Goal: Ability to verbalize activity precautions or restrictions will improve Outcome: Progressing Goal: Understanding of discharge needs will improve Outcome: Progressing   Problem: Activity: Goal: Ability to perform//tolerate increased activity and mobilize with assistive devices will improve Outcome: Progressing   Problem: Clinical Measurements: Goal: Postoperative complications will be avoided or minimized Outcome: Progressing   Problem: Self-Care: Goal: Ability to meet self-care needs will improve Outcome: Progressing   Problem: Self-Concept: Goal: Ability to maintain and perform role responsibilities to the fullest extent possible will improve Outcome: Progressing   Problem: Education: Goal: Knowledge of General Education information will improve Description: Including pain rating scale, medication(s)/side effects and non-pharmacologic comfort measures Outcome: Progressing   Problem: Health Behavior/Discharge Planning: Goal: Ability to manage health-related needs will improve Outcome: Progressing   Problem: Clinical Measurements: Goal: Ability to maintain clinical measurements within normal limits will improve Outcome: Progressing Goal: Will remain free from infection Outcome: Progressing Goal: Diagnostic test  results will improve Outcome: Progressing Goal: Respiratory complications will improve Outcome: Progressing Goal: Cardiovascular complication will be avoided Outcome: Progressing   Problem: Activity: Goal: Risk for activity intolerance will decrease Outcome: Progressing   Problem: Nutrition: Goal: Adequate nutrition will be maintained Outcome: Progressing   Problem: Coping: Goal: Level of anxiety will decrease Outcome: Progressing   Problem: Elimination: Goal: Will not experience complications related to bowel motility Outcome: Progressing Goal: Will not experience complications related to urinary retention Outcome: Progressing   Problem: Pain Management: Goal: General experience of  comfort will improve Outcome: Progressing   Problem: Safety: Goal: Ability to remain free from injury will improve Outcome: Progressing   Problem: Skin Integrity: Goal: Risk for impaired skin integrity will decrease Outcome: Progressing

## 2023-05-24 NOTE — Plan of Care (Signed)
  Problem: Pain Management: Goal: Pain level will decrease with appropriate interventions Outcome: Progressing   Problem: Skin Integrity: Goal: Demonstration of wound healing without infection will improve Outcome: Progressing   Problem: Education: Goal: Ability to describe self-care measures that may prevent or decrease complications (Diabetes Survival Skills Education) will improve Outcome: Progressing Goal: Individualized Educational Video(s) Outcome: Progressing   Problem: Coping: Goal: Ability to adjust to condition or change in health will improve Outcome: Progressing   Problem: Fluid Volume: Goal: Ability to maintain a balanced intake and output will improve Outcome: Progressing   Problem: Health Behavior/Discharge Planning: Goal: Ability to identify and utilize available resources and services will improve Outcome: Progressing Goal: Ability to manage health-related needs will improve Outcome: Progressing   Problem: Metabolic: Goal: Ability to maintain appropriate glucose levels will improve Outcome: Progressing   Problem: Nutritional: Goal: Maintenance of adequate nutrition will improve Outcome: Progressing Goal: Progress toward achieving an optimal weight will improve Outcome: Progressing   Problem: Skin Integrity: Goal: Risk for impaired skin integrity will decrease Outcome: Progressing   Problem: Tissue Perfusion: Goal: Adequacy of tissue perfusion will improve Outcome: Progressing   Problem: Education: Goal: Knowledge of risk factors and measures for prevention of condition will improve Outcome: Progressing   Problem: Coping: Goal: Psychosocial and spiritual needs will be supported Outcome: Progressing   Problem: Respiratory: Goal: Will maintain a patent airway Outcome: Progressing Goal: Complications related to the disease process, condition or treatment will be avoided or minimized Outcome: Progressing

## 2023-05-25 DIAGNOSIS — I10 Essential (primary) hypertension: Secondary | ICD-10-CM | POA: Diagnosis not present

## 2023-05-25 DIAGNOSIS — E11628 Type 2 diabetes mellitus with other skin complications: Secondary | ICD-10-CM | POA: Diagnosis not present

## 2023-05-25 DIAGNOSIS — L089 Local infection of the skin and subcutaneous tissue, unspecified: Secondary | ICD-10-CM | POA: Diagnosis not present

## 2023-05-25 LAB — BASIC METABOLIC PANEL
Anion gap: 5 (ref 5–15)
BUN: 9 mg/dL (ref 6–20)
CO2: 25 mmol/L (ref 22–32)
Calcium: 7.5 mg/dL — ABNORMAL LOW (ref 8.9–10.3)
Chloride: 108 mmol/L (ref 98–111)
Creatinine, Ser: 1 mg/dL (ref 0.61–1.24)
GFR, Estimated: >60 mL/min (ref >60)
Glucose, Bld: 112 mg/dL — ABNORMAL HIGH (ref 70–99)
Potassium: 4.1 mmol/L (ref 3.5–5.1)
Sodium: 138 mmol/L (ref 135–145)

## 2023-05-25 LAB — CBC
HCT: 36 % — ABNORMAL LOW (ref 39.0–52.0)
Hemoglobin: 10.7 g/dL — ABNORMAL LOW (ref 13.0–17.0)
MCH: 25.6 pg — ABNORMAL LOW (ref 26.0–34.0)
MCHC: 29.7 g/dL — ABNORMAL LOW (ref 30.0–36.0)
MCV: 86.1 fL (ref 80.0–100.0)
Platelets: 366 10*3/uL (ref 150–400)
RBC: 4.18 MIL/uL — ABNORMAL LOW (ref 4.22–5.81)
RDW: 16.4 % — ABNORMAL HIGH (ref 11.5–15.5)
WBC: 5.9 10*3/uL (ref 4.0–10.5)
nRBC: 0 % (ref 0.0–0.2)

## 2023-05-25 NOTE — Plan of Care (Signed)
  Problem: Clinical Measurements: Goal: Signs and symptoms of infection will decrease Outcome: Progressing   Problem: Education: Goal: Knowledge of General Education information will improve Description: Including pain rating scale, medication(s)/side effects and non-pharmacologic comfort measures Outcome: Progressing   Problem: Clinical Measurements: Goal: Will remain free from infection Outcome: Progressing Goal: Respiratory complications will improve Outcome: Progressing Goal: Cardiovascular complication will be avoided Outcome: Progressing   Problem: Nutrition: Goal: Adequate nutrition will be maintained Outcome: Progressing   Problem: Elimination: Goal: Will not experience complications related to bowel motility Outcome: Progressing   Problem: Safety: Goal: Ability to remain free from injury will improve Outcome: Progressing   Problem: Skin Integrity: Goal: Risk for impaired skin integrity will decrease Outcome: Progressing

## 2023-05-25 NOTE — TOC Progression Note (Addendum)
 Transition of Care (TOC) - Progression Note    Patient Details  Name: Alan Tapia. MRN: [id number] Date of Birth: 1975-05-15  Transition of Care [facility]) CM/SW Contact  Harlene Sharps, LCSW Phone Number: 05/25/2023, 1:25 PM  Clinical Narrative:    CSW left VM for Viki W/ DSS as she had stated she would follow up on pt's disability on Friday. TOC will continue to follow.   CSW received a call back from [facility], she notes they have received the information for the disability case worker and has left several messages. She states she will give it a day and try again. If she doesn't hear, she may request CSW attempt to reach caseworker as well. TOC will continue to follow.   Expected Discharge Plan: Skilled Nursing Facility Barriers to Discharge: Insurance Authorization, Continued Medical Work up, SNF Pending bed offer  Expected Discharge Plan and Services In-house Referral: Clinical Social Work     Living arrangements for the past 2 months: Single Family Home Expected Discharge Date: 04/11/23                                     Social Determinants of Health (SDOH) Interventions SDOH Screenings   Food Insecurity: No Food Insecurity (04/07/2023)  Housing: Unknown (04/23/2023)  Recent Concern: Housing - Medium Risk (04/07/2023)  Transportation Needs: No Transportation Needs (04/07/2023)  Recent Concern: Transportation Needs - Unmet Transportation Needs (02/17/2023)   Received from [facility]  Utilities: At Risk (04/07/2023)  Alcohol  Screen: Low Risk  (08/20/2022)  Financial Resource Strain: Patient Unable To Answer (08/20/2022)  Tobacco Use: Low Risk  (04/08/2023)    Readmission Risk Interventions    04/07/2023    2:34 PM  Readmission Risk Prevention Plan  Transportation Screening Complete  PCP or Specialist Appt within 3-5 Days Complete  HRI or Home Care Consult Complete  Social Work Consult for Recovery Care Planning/Counseling Complete  Palliative Care  Screening Not Applicable  Medication Review [employer]) Complete

## 2023-05-25 NOTE — Progress Notes (Signed)
 PROGRESS NOTE    Alan Tapia.  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 He was also having nausea, vomiting and diarrhea. Orthopedic surgery was consulted and patient underwent right BKA 11/27 by Dr. Harden.  Now awaiting placement    Procedures: 04-08-2023 Right BKA  Consultants: [name]    Assessment and Plan: * Sepsis (HCC) -resolved after treatment and amputation of right foot.  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 increase Entresto  to 97-103 bid.  -adjust meds as able  Essential hypertension - Changed to Toprol -XL 200 mg daily. Scr is normal today. Scr 0.93. BUN 8.  Will also increase Entresto  to 97-103 bid.   Diarrhea-resolved as of 05/11/2023 Resolved. Now constipated-- bowel regimen  CAP (community acquired pneumonia)-resolved as of 05/11/2023 Treated and resolved.  Metabolic acidosis-resolved as of 05/11/2023 Resolved.  Chronic pain -continue pain meds PRN with bowel regimen  Acute on chronic anemia -trend as able  COVID-19 virus infection - covid test was positive on 04-30-2023. Today is 12 days from PCR. Pt can come out of droplet isolation.   Malnutrition of moderate degree 05-11-2023 seen RD noted from 05-07-2023. Moderate Malnutrition  related to chronic illness as evidenced by energy intake < or equal to 75% for > or equal to 1 month, moderate fat depletion, moderate muscle depletion.    PVD (peripheral vascular disease) (HCC) - now has bilateral BKA.   Insulin  dependent type 2 diabetes mellitus (HCC) -d/c SSI for now as blood sugars well controlled  Acute osteomyelitis of right foot (HCC) - resolved after right BKA on 04-08-2023.  Obesity, Class II, BMI 35-39.9 Estimated body mass index is 35.34 kg/m as calculated from the following:   Height as of this encounter: 6' 4 (1.93 m).   Weight as of this encounter: 131.7 kg.        Code Status: Full Code Family Communication: discussed with pt and wife at bedside Disposition Plan: SNF Reason for continuing need for hospitalization: medically stable for DC to SNF  Objective: Vitals:   05/24/23 2021 05/25/23 0349 05/25/23 0500 05/25/23 0754  BP: (!) 144/82 (!) 156/94  (!) 147/79  Pulse: 78 86  78  Resp: 18 18  16   Temp: 98.6 F (37 C) 98.6 F (37 C)  97.7 F (36.5 C)  TempSrc: Oral Oral  Oral  SpO2: 97% 100%  98%  Weight:   131.7 kg   Height:        Intake/Output Summary (Last 24 hours) at 05/25/2023 1053 Last data filed at 05/25/2023 0517 Gross per 24 hour  Intake --  Output 250 ml  Net -250 ml    Filed Weights   05/22/23 0545 05/23/23 0444 05/25/23 0500  Weight: 135 kg 133.1 kg 131.7 kg    Examination:   General: Appearance:    Obese male in no acute distress  Lungs:    respirations unlabored  Heart:    Normal heart rate.   MS:   Below knee amputation of left lower extremity is noted. Below knee amputation of right lower extremity is noted.   Neurologic:   Awake, alert     Data Reviewed: I have personally reviewed following labs and imaging studies  Basic Metabolic Panel: Recent Labs  Lab 05/20/23 0657 05/23/23 0449 05/25/23 0500  NA 139 138 138  K 3.9 4.0 4.1  CL 105 109 108  CO2 27 26 25   GLUCOSE 121* 163* 112*  BUN 8  7 9   CREATININE 0.93 1.07 1.00  CALCIUM  7.7* 7.4* 7.5*  MG 1.8 1.8  --    GFR: Estimated Creatinine Clearance: 135.4 mL/min (by C-G formula based on SCr of 1 mg/dL). Liver Function Tests: Recent Labs  Lab 05/20/23 0657  AST 12*  ALT 12  ALKPHOS 182*  BILITOT 0.5  PROT 5.3*  ALBUMIN  <1.5*   BNP (last 3 results) Recent Labs    12/14/22 0839 05/23/23 0449  BNP 197.3* 1,520.2*   CBG: Recent Labs  Lab 05/21/23 0744 05/21/23 1216 05/21/23 1747 05/21/23 2024 05/22/23 0745  GLUCAP 130* 108* 122* 101* 138*    Scheduled Meds:  docusate sodium   100 mg Oral BID   enoxaparin  (LOVENOX ) injection  70 mg Subcutaneous Daily   feeding supplement  237 mL Oral BID BM   Gerhardt's butt cream   Topical BID   lidocaine   2 patch Transdermal Q24H   melatonin  5 mg Oral QHS   metoprolol  succinate  200 mg Oral Daily   nutrition supplement (JUVEN)  1 packet Oral BID BM   pantoprazole   40 mg Oral BID   polyethylene glycol  17 g Oral Daily   saccharomyces boulardii  250 mg Oral BID   sacubitril -valsartan   1 tablet Oral BID   Continuous Infusions:   LOS: 48 days   Time spent: 40 minutes  Jamera Vanloan U Keeley Sussman, DO  [facility]  05/25/2023, 10:53 AM

## 2023-05-25 NOTE — Plan of Care (Signed)
 Problem: Fluid Volume: Goal: Hemodynamic stability will improve Outcome: Progressing   Problem: Clinical Measurements: Goal: Diagnostic test results will improve Outcome: Progressing Goal: Signs and symptoms of infection will decrease Outcome: Progressing   Problem: Respiratory: Goal: Ability to maintain adequate ventilation will improve Outcome: Progressing   Problem: Education: Goal: Knowledge of General Education information will improve Description: Including pain rating scale, medication(s)/side effects and non-pharmacologic comfort measures Outcome: Progressing   Problem: Health Behavior/Discharge Planning: Goal: Ability to manage health-related needs will improve Outcome: Progressing   Problem: Clinical Measurements: Goal: Ability to maintain clinical measurements within normal limits will improve Outcome: Progressing Goal: Will remain free from infection Outcome: Progressing Goal: Diagnostic test results will improve Outcome: Progressing Goal: Respiratory complications will improve Outcome: Progressing Goal: Cardiovascular complication will be avoided Outcome: Progressing   Problem: Activity: Goal: Risk for activity intolerance will decrease Outcome: Progressing   Problem: Nutrition: Goal: Adequate nutrition will be maintained Outcome: Progressing   Problem: Coping: Goal: Level of anxiety will decrease Outcome: Progressing   Problem: Elimination: Goal: Will not experience complications related to bowel motility Outcome: Progressing Goal: Will not experience complications related to urinary retention Outcome: Progressing   Problem: Pain Management: Goal: General experience of comfort will improve Outcome: Progressing   Problem: Safety: Goal: Ability to remain free from injury will improve Outcome: Progressing   Problem: Skin Integrity: Goal: Risk for impaired skin integrity will decrease Outcome: Progressing   Problem: Activity: Goal: Ability  to tolerate increased activity will improve Outcome: Progressing   Problem: Clinical Measurements: Goal: Ability to maintain a body temperature in the normal range will improve Outcome: Progressing   Problem: Respiratory: Goal: Ability to maintain adequate ventilation will improve Outcome: Progressing Goal: Ability to maintain a clear airway will improve Outcome: Progressing   Problem: Education: Goal: Knowledge of the prescribed therapeutic regimen will improve Outcome: Progressing Goal: Ability to verbalize activity precautions or restrictions will improve Outcome: Progressing Goal: Understanding of discharge needs will improve Outcome: Progressing   Problem: Activity: Goal: Ability to perform//tolerate increased activity and mobilize with assistive devices will improve Outcome: Progressing   Problem: Clinical Measurements: Goal: Postoperative complications will be avoided or minimized Outcome: Progressing   Problem: Self-Care: Goal: Ability to meet self-care needs will improve Outcome: Progressing   Problem: Self-Concept: Goal: Ability to maintain and perform role responsibilities to the fullest extent possible will improve Outcome: Progressing   Problem: Education: Goal: Knowledge of General Education information will improve Description: Including pain rating scale, medication(s)/side effects and non-pharmacologic comfort measures Outcome: Progressing   Problem: Health Behavior/Discharge Planning: Goal: Ability to manage health-related needs will improve Outcome: Progressing   Problem: Clinical Measurements: Goal: Ability to maintain clinical measurements within normal limits will improve Outcome: Progressing Goal: Will remain free from infection Outcome: Progressing Goal: Diagnostic test results will improve Outcome: Progressing Goal: Respiratory complications will improve Outcome: Progressing Goal: Cardiovascular complication will be avoided Outcome:  Progressing   Problem: Activity: Goal: Risk for activity intolerance will decrease Outcome: Progressing   Problem: Nutrition: Goal: Adequate nutrition will be maintained Outcome: Progressing   Problem: Coping: Goal: Level of anxiety will decrease Outcome: Progressing   Problem: Elimination: Goal: Will not experience complications related to bowel motility Outcome: Progressing Goal: Will not experience complications related to urinary retention Outcome: Progressing   Problem: Pain Management: Goal: General experience of comfort will improve Outcome: Progressing   Problem: Safety: Goal: Ability to remain free from injury will improve Outcome: Progressing   Problem: Skin Integrity: Goal: Risk for  impaired skin integrity will decrease Outcome: Progressing

## 2023-05-26 ENCOUNTER — Inpatient Hospital Stay (HOSPITAL_COMMUNITY): Payer: Medicaid Other

## 2023-05-26 DIAGNOSIS — E11628 Type 2 diabetes mellitus with other skin complications: Secondary | ICD-10-CM | POA: Diagnosis not present

## 2023-05-26 DIAGNOSIS — L089 Local infection of the skin and subcutaneous tissue, unspecified: Secondary | ICD-10-CM | POA: Diagnosis not present

## 2023-05-26 MED ORDER — POLYETHYLENE GLYCOL 3350 17 G PO PACK
17.0000 g | PACK | Freq: Two times a day (BID) | ORAL | Status: DC
  Administered 2023-05-26 – 2023-06-09 (×8): 17 g via ORAL
  Filled 2023-05-26 (×24): qty 1

## 2023-05-26 NOTE — Progress Notes (Signed)
 PROGRESS NOTE    Alan Tapia.  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 He was also having nausea, vomiting and diarrhea. Orthopedic surgery was consulted and patient underwent right BKA 11/27 by Dr. Harden.  Now awaiting placement.    Procedures: 04-08-2023 Right BKA  Consultants: [name]    Assessment and Plan: * Sepsis (HCC) -resolved after treatment and amputation of right foot.  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 increase Entresto  to 97-103 bid.  -adjust meds as able-- patient is resistant  Essential hypertension - Changed to Toprol -XL 200 mg daily. Scr is normal today. Scr 0.93. BUN 8.  Will also increase Entresto  to 97-103 bid.   Diarrhea-resolved as of 05/11/2023 Resolved. Now constipated-- bowel regimen  CAP (community acquired pneumonia)-resolved as of 05/11/2023 Treated and resolved.  Metabolic acidosis-resolved as of 05/11/2023 Resolved.  Chronic pain -continue pain meds PRN with bowel regimen  Acute on chronic anemia -trend as able  COVID-19 virus infection - covid test was positive on 04-30-2023. Today is 12 days from PCR. Pt can come out of droplet isolation.   Malnutrition of moderate  degree 05-11-2023 seen RD noted from 05-07-2023. Moderate Malnutrition related to chronic illness as evidenced by energy intake < or equal to 75% for > or equal to 1 month, moderate fat depletion, moderate muscle depletion.    PVD (peripheral vascular disease) (HCC) - now has bilateral BKA.   Insulin  dependent type 2 diabetes mellitus (HCC) -d/c SSI for now as blood sugars well controlled  Acute osteomyelitis of right foot (HCC) - resolved after right BKA on 04-08-2023.  Obesity, Class II, BMI 35-39.9 Estimated body mass index is 35.34 kg/m as calculated from the following:   Height as of this encounter: 6' 4 (1.93 m).   Weight as of this encounter: 131.7 kg.        Code Status: Full Code Family Communication: discussed with pt and wife at bedside Disposition Plan: SNF Reason for continuing need for hospitalization: medically stable for DC to SNF- placement issues  Objective: Vitals:   05/25/23 1602 05/25/23 2040 05/26/23 0500 05/26/23 0828  BP: (!) 152/82 (!) 152/83 (!) 141/73 (!) 142/72  Pulse: 79 72 70 68  Resp: 16 16 16 17   Temp: 98.1 F (36.7 C) 98.4 F (36.9 C) 97.9 F (36.6 C) 98.3 F (36.8 C)  TempSrc: Oral Oral Oral   SpO2: 100% 100% 99% 100%  Weight:      Height:       No intake or output data in the 24 hours ending 05/26/23 1318   Filed Weights   05/22/23 0545 05/23/23 0444 05/25/23 0500  Weight: 135 kg 133.1 kg 131.7 kg    Examination:   General: Appearance:    Obese male in no acute distress   + BS, obese abd  Lungs:    respirations  unlabored  Heart:    Normal heart rate.   MS:   Below knee amputation of left lower extremity is noted. Below knee amputation of right lower extremity is noted.   Neurologic:   Awake, alert     Data Reviewed: I have personally reviewed following labs and imaging studies  Basic Metabolic Panel: Recent Labs  Lab 05/20/23 0657 05/23/23 0449 05/25/23 0500  NA 139 138 138  K 3.9 4.0 4.1  CL 105 109 108   CO2 27 26 25   GLUCOSE 121* 163* 112*  BUN 8 7 9   CREATININE 0.93 1.07 1.00  CALCIUM  7.7* 7.4* 7.5*  MG 1.8 1.8  --    GFR: Estimated Creatinine Clearance: 135.4 mL/min (by C-G formula based on SCr of 1 mg/dL). Liver Function Tests: Recent Labs  Lab 05/20/23 0657  AST 12*  ALT 12  ALKPHOS 182*  BILITOT 0.5  PROT 5.3*  ALBUMIN  <1.5*   BNP (last 3 results) Recent Labs    12/14/22 0839 05/23/23 0449  BNP 197.3* 1,520.2*   CBG: Recent Labs  Lab 05/21/23 0744 05/21/23 1216 05/21/23 1747 05/21/23 2024 05/22/23 0745  GLUCAP 130* 108* 122* 101* 138*    Scheduled Meds:  docusate sodium   100 mg Oral BID   enoxaparin  (LOVENOX ) injection  70 mg Subcutaneous Daily   feeding supplement  237 mL Oral BID BM   Gerhardt's butt cream   Topical BID   lidocaine   2 patch Transdermal Q24H   melatonin  5 mg Oral QHS   metoprolol  succinate  200 mg Oral Daily   nutrition supplement (JUVEN)  1 packet Oral BID BM   pantoprazole   40 mg Oral BID   polyethylene glycol  17 g Oral Daily   saccharomyces boulardii  250 mg Oral BID   sacubitril -valsartan   1 tablet Oral BID   Continuous Infusions:   LOS: 49 days   Time spent: 40 minutes  Alan Wojdyla U Monet North, DO  [facility]  05/26/2023, 1:18 PM

## 2023-05-26 NOTE — Progress Notes (Signed)
 Physical Therapy Treatment Patient Details Name: Alan Tapia. MRN: [id number] DOB: May 16, 1975 Today's Date: 05/26/2023   History of Present Illness The pt is a 48 yo male presenting 11/25 with nausea, vomiting, and fever as well as R foot wound. 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    PT Comments  Pt agreeable to participate in therapy session. Barriers for mobility progression include continued perineal discomfort with sliding attempts out of bed to wheelchair. Utilized maxi slide to perform anterior posterior transfer from bed <> wheelchair with +3 assist. Pt unable to tolerate sitting up in wheelchair for extended period due to discomfort. Continue to try to strategize techniques for pressure relief and optimal positioning for transfer training.    If plan is discharge home, recommend the following: Two people to help with walking and/or transfers;Assist for transportation;Assistance with cooking/housework;Help with stairs or ramp for entrance   Can travel by private vehicle     No  Equipment Recommendations  Hoyer lift;Wheelchair cushion (measurements PT);Wheelchair (measurements PT);Hospital bed    Recommendations for Other Services       Precautions / Restrictions Precautions Precautions: Fall Restrictions Weight Bearing Restrictions Per Provider Order: Yes RLE Weight Bearing Per Provider Order: Non weight bearing     Mobility  Bed Mobility Overal bed mobility: Needs Assistance Bed Mobility: Supine to Sit, Rolling Rolling: Min assist   Supine to sit: Min assist     General bed mobility comments: Pt progressing into long sitting position with HHA and support at trunk to pull up    Transfers Overall transfer level: Needs assistance Equipment used: None Transfers: Bed to chair/wheelchair/BSC          Anterior-Posterior transfers: Max assist (+3)   General transfer comment: MaxA + 3 for anterior posterior transfer from bed to chair, use of bed pad and maxi slide and pt pushing with arms. difficulty with reciprocal scooting    Ambulation/Gait                   Stairs             Wheelchair Mobility     Tilt Bed    Modified Rankin (Stroke Patients Only)       Balance Overall balance assessment: Needs assistance Sitting-balance support: Feet unsupported, No upper extremity supported Sitting balance-Leahy Scale: Fair                                      Cognition Arousal: Alert Behavior During Therapy: WFL for tasks assessed/performed Overall Cognitive Status: Within Functional Limits for tasks assessed                                          Exercises      General Comments        Pertinent Vitals/Pain Pain Assessment Pain Assessment: 0-10 Pain Score: 10-Worst pain ever Pain Location: perineum Pain Descriptors / Indicators: Discomfort Pain Intervention(s): Limited activity within patient's tolerance, Monitored during session    Home Living  Prior Function            PT Goals (current goals can now be found in the care plan section) Acute Rehab PT Goals Patient Stated Goal: get bil prosthetics Time For Goal Achievement: 06/09/23 Potential to Achieve Goals: Fair    Frequency    Min 1X/week      PT Plan      Co-evaluation              AM-PAC PT 6 Clicks Mobility   Outcome Measure  Help needed turning from your back to your side while in a flat bed without using bedrails?: A Lot Help needed moving from lying on your back to sitting on the side of a flat bed without using bedrails?: A Lot Help needed moving to and from a bed to a chair (including a wheelchair)?: Total Help needed standing up from a chair using your arms (e.g., wheelchair or bedside  chair)?: Total Help needed to walk in hospital room?: Total Help needed climbing 3-5 steps with a railing? : Total 6 Click Score: 8    End of Session Equipment Utilized During Treatment: Gait belt Activity Tolerance: Patient limited by pain Patient left: in bed;with call bell/phone within reach;with bed alarm set Nurse Communication: Mobility status PT Visit Diagnosis: Other abnormalities of gait and mobility (R26.89);Muscle weakness (generalized) (M62.81);Pain Pain - Right/Left: Right     Time: 8564-8480 PT Time Calculation (min) (ACUTE ONLY): 44 min  Charges:    $Therapeutic Activity: 38-52 mins PT General Charges $$ ACUTE PT VISIT: 1 Visit                     [name], PT, DPT Acute Rehabilitation Services Office [phone]    [name] 05/26/2023, 4:15 PM

## 2023-05-26 NOTE — Plan of Care (Signed)
 Problem: Fluid Volume: Goal: Hemodynamic stability will improve Outcome: Progressing   Problem: Clinical Measurements: Goal: Diagnostic test results will improve Outcome: Progressing Goal: Signs and symptoms of infection will decrease Outcome: Progressing   Problem: Respiratory: Goal: Ability to maintain adequate ventilation will improve Outcome: Progressing   Problem: Education: Goal: Knowledge of General Education information will improve Description: Including pain rating scale, medication(s)/side effects and non-pharmacologic comfort measures Outcome: Progressing   Problem: Health Behavior/Discharge Planning: Goal: Ability to manage health-related needs will improve Outcome: Progressing   Problem: Clinical Measurements: Goal: Ability to maintain clinical measurements within normal limits will improve Outcome: Progressing Goal: Will remain free from infection Outcome: Progressing Goal: Diagnostic test results will improve Outcome: Progressing Goal: Respiratory complications will improve Outcome: Progressing Goal: Cardiovascular complication will be avoided Outcome: Progressing   Problem: Activity: Goal: Risk for activity intolerance will decrease Outcome: Progressing   Problem: Nutrition: Goal: Adequate nutrition will be maintained Outcome: Progressing   Problem: Coping: Goal: Level of anxiety will decrease Outcome: Progressing   Problem: Elimination: Goal: Will not experience complications related to bowel motility Outcome: Progressing Goal: Will not experience complications related to urinary retention Outcome: Progressing   Problem: Pain Management: Goal: General experience of comfort will improve Outcome: Progressing   Problem: Safety: Goal: Ability to remain free from injury will improve Outcome: Progressing   Problem: Skin Integrity: Goal: Risk for impaired skin integrity will decrease Outcome: Progressing   Problem: Activity: Goal: Ability  to tolerate increased activity will improve Outcome: Progressing   Problem: Clinical Measurements: Goal: Ability to maintain a body temperature in the normal range will improve Outcome: Progressing   Problem: Respiratory: Goal: Ability to maintain adequate ventilation will improve Outcome: Progressing Goal: Ability to maintain a clear airway will improve Outcome: Progressing   Problem: Education: Goal: Knowledge of the prescribed therapeutic regimen will improve Outcome: Progressing Goal: Ability to verbalize activity precautions or restrictions will improve Outcome: Progressing Goal: Understanding of discharge needs will improve Outcome: Progressing   Problem: Activity: Goal: Ability to perform//tolerate increased activity and mobilize with assistive devices will improve Outcome: Progressing   Problem: Clinical Measurements: Goal: Postoperative complications will be avoided or minimized Outcome: Progressing   Problem: Self-Care: Goal: Ability to meet self-care needs will improve Outcome: Progressing   Problem: Self-Concept: Goal: Ability to maintain and perform role responsibilities to the fullest extent possible will improve Outcome: Progressing   Problem: Education: Goal: Knowledge of General Education information will improve Description: Including pain rating scale, medication(s)/side effects and non-pharmacologic comfort measures Outcome: Progressing   Problem: Health Behavior/Discharge Planning: Goal: Ability to manage health-related needs will improve Outcome: Progressing   Problem: Clinical Measurements: Goal: Ability to maintain clinical measurements within normal limits will improve Outcome: Progressing Goal: Will remain free from infection Outcome: Progressing Goal: Diagnostic test results will improve Outcome: Progressing Goal: Respiratory complications will improve Outcome: Progressing Goal: Cardiovascular complication will be avoided Outcome:  Progressing   Problem: Activity: Goal: Risk for activity intolerance will decrease Outcome: Progressing   Problem: Nutrition: Goal: Adequate nutrition will be maintained Outcome: Progressing   Problem: Coping: Goal: Level of anxiety will decrease Outcome: Progressing   Problem: Elimination: Goal: Will not experience complications related to bowel motility Outcome: Progressing Goal: Will not experience complications related to urinary retention Outcome: Progressing   Problem: Pain Management: Goal: General experience of comfort will improve Outcome: Progressing   Problem: Safety: Goal: Ability to remain free from injury will improve Outcome: Progressing   Problem: Skin Integrity: Goal: Risk for  impaired skin integrity will decrease Outcome: Progressing

## 2023-05-27 DIAGNOSIS — E440 Moderate protein-calorie malnutrition: Secondary | ICD-10-CM | POA: Diagnosis not present

## 2023-05-27 DIAGNOSIS — M86271 Subacute osteomyelitis, right ankle and foot: Secondary | ICD-10-CM | POA: Diagnosis not present

## 2023-05-27 DIAGNOSIS — D649 Anemia, unspecified: Secondary | ICD-10-CM | POA: Diagnosis not present

## 2023-05-27 DIAGNOSIS — I10 Essential (primary) hypertension: Secondary | ICD-10-CM | POA: Diagnosis not present

## 2023-05-27 LAB — BASIC METABOLIC PANEL
Anion gap: 4 — ABNORMAL LOW (ref 5–15)
BUN: 8 mg/dL (ref 6–20)
CO2: 26 mmol/L (ref 22–32)
Calcium: 7.2 mg/dL — ABNORMAL LOW (ref 8.9–10.3)
Chloride: 110 mmol/L (ref 98–111)
Creatinine, Ser: 1.04 mg/dL (ref 0.61–1.24)
GFR, Estimated: >60 mL/min (ref >60)
Glucose, Bld: 158 mg/dL — ABNORMAL HIGH (ref 70–99)
Potassium: 4 mmol/L (ref 3.5–5.1)
Sodium: 140 mmol/L (ref 135–145)

## 2023-05-27 LAB — CBC
HCT: 31.9 % — ABNORMAL LOW (ref 39.0–52.0)
Hemoglobin: 9.5 g/dL — ABNORMAL LOW (ref 13.0–17.0)
MCH: 25.7 pg — ABNORMAL LOW (ref 26.0–34.0)
MCHC: 29.8 g/dL — ABNORMAL LOW (ref 30.0–36.0)
MCV: 86.4 fL (ref 80.0–100.0)
Platelets: 295 10*3/uL (ref 150–400)
RBC: 3.69 MIL/uL — ABNORMAL LOW (ref 4.22–5.81)
RDW: 16.1 % — ABNORMAL HIGH (ref 11.5–15.5)
WBC: 4.9 10*3/uL (ref 4.0–10.5)
nRBC: 0 % (ref 0.0–0.2)

## 2023-05-27 NOTE — Progress Notes (Addendum)
 Nutrition Follow-up  DOCUMENTATION CODES:   Non-severe (moderate) malnutrition in context of chronic illness, Obesity unspecified  INTERVENTION:   - Continue low sodium regular diet with double proteins and snacks daily  - Ensure Enlive po BID, each supplement provides 350 kcal and 20 grams of protein. - Continue MVI with minerals daily - Discontinue Juven as RN reports no open areas of breakdown  NUTRITION DIAGNOSIS:   Moderate Malnutrition related to chronic illness as evidenced by energy intake < or equal to 75% for > or equal to 1 month, moderate fat depletion, moderate muscle depletion.  - Progressing   GOAL:   Patient will meet greater than or equal to 90% of their needs  - Ongoing   MONITOR:   PO intake, Supplement acceptance, Weight trends  REASON FOR ASSESSMENT:   Consult Assessment of nutrition requirement/status, Calorie Count  ASSESSMENT:   48 y.o. M presented  from home with N/V/D x 2 weeks and right foot wound, admitted with sepsis. PMH; type 2 diabetes, CHF, CKD 2, BKA of left lower extremity and ongoing diabetic foot infection, s/p RBKA.  4/5 - LBKA 11/27- RBKA  Patient is having fair appetite and PO intake. He reports being nauseas and complains of abdominal pain which he states is from his medications. Throughout admission pt has endorsed fluctuating nausea and abdominal pain. Per MD x-ray showed he appeared constipated and bowel regimen was added. He is doing good with double proteins with his meals and consuming >75%. Drinking his Ensures and eating his snacks.   He is not a fan of the Juven. Noted stage 2 pressure injury on perineum, pt has been refusing care and wanting to take care of it himself. Patient allowed RN to check today and reports seeing no open areas of breakdown just bumps. Will D/C Juven.   Admit weight: 145.2 kg  Current weight: 132.6 kg    Average Meal Intake: 1/7-1/14: 68% intake x 8 recorded meals  Nutritionally Relevant  Medications: Scheduled Meds:  feeding supplement  237 mL Oral BID BM   melatonin  5 mg Oral QHS   metoprolol  succinate  200 mg Oral Daily   nutrition supplement (JUVEN)  1 packet Oral BID BM   pantoprazole   40 mg Oral BID    Labs Reviewed: Calcium 7.2 HgbA1c HgbA1c 6.8 (03/2023)    NUTRITION - FOCUSED PHYSICAL EXAM:  Flowsheet Row Most Recent Value  Orbital Region Mild depletion  Upper Arm Region No depletion  Thoracic and Lumbar Region No depletion  Buccal Region Mild depletion  Temple Region Moderate depletion  Clavicle Bone Region Moderate depletion  Clavicle and Acromion Bone Region Moderate depletion  Scapular Bone Region Moderate depletion  Dorsal Hand Moderate depletion  Patellar Region Unable to assess  Anterior Thigh Region Unable to assess  Posterior Calf Region Unable to assess  Edema (RD Assessment) None  Hair Reviewed  Eyes Reviewed  Mouth Reviewed  Skin Reviewed  [Dry]  Nails Reviewed       Diet Order:   Diet Order             Diet regular Room service appropriate? Yes; Fluid consistency: Thin  Diet effective now           Diet - low sodium heart healthy                   EDUCATION NEEDS:   Education needs have been addressed  Skin:  Skin Assessment: Skin Integrity Issues: Skin Integrity Issues:: Stage II,  Incisions Stage II: Perinieum- RN checked 1/15 and no open areas of breakdown Incisions: R knee  Last BM:  1/14  Height:   Ht Readings from Last 1 Encounters:  04/08/23 6\' 4"  (1.93 m)    Weight:   Wt Readings from Last 1 Encounters:  05/27/23 132.6 kg    Ideal Body Weight:  79.16 kg  BMI:  Body mass index is 35.58 kg/m.  Estimated Nutritional Needs:   Kcal:  2300-2600 kcal  Protein:  130-160 gm  Fluid:  >2.2L    Frederik Jansky, RD Registered Dietitian  See Amion for more information

## 2023-05-27 NOTE — Progress Notes (Signed)
 PROGRESS NOTE    Alan Tapia.  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 He was also having nausea, vomiting and diarrhea. Orthopedic surgery was consulted and patient underwent right BKA 11/27 by Dr. Julio Ohm.   Assessment and Plan:  Sepsis Present on admission, secondary to right foot osteomyelitis. Blood cultures with no growth.  Right foot osteomyelitis s/p right BKA Patient was initially treated empirically with antibiotics. Orthopedic surgery performed a right BKA on 11/27. Antibiotics discontinued.  CKD stage II Stable.  Chronic combined systolic and diastolic heart failure Stable. -Continue Toprol  XL and Entresto   Primary hypertension -Continue Toprol  XL and Entresto   Diarrhea Resolved.  Community acquired pneumonia Patient treated with Zosyn  and doxycycline . Resolved.  Acute on chronic anemia Patient with hemoglobin drop down to 5.9 g/dL secondary to right foot wound, requiring 3 units of PRBC on 11/26. Patient requiring another 1 unit of PRBC on 11/30. Stable.  COVID-19 infection Patient completed isolation protocol. Patient treated with Paxlovid .  Constipation -Continue bowel regimen  Pressure injury Bilateral perineum, unclear if present on admission.   DVT prophylaxis: Lovenox  Code Status:   Code Status: Full Code Family Communication: Wife at bedside Disposition Plan: Discharge to SNF once bed is available   Consultants:    Procedures:    Antimicrobials:     Subjective: Patient with no concerns this morning. Abdominal pain is  doing better since having bowel movements yesterday.  Objective: BP (!) 151/88 (BP Location: Right Arm)   Pulse 72   Temp 98.6 F (37 C) (Oral)   Resp 17   Ht 6\' 4"  (1.93 m)   Wt 132.6 kg   SpO2 100%   BMI 35.58 kg/m   Examination:  General exam: Appears calm and comfortable Respiratory system: Clear to auscultation. Respiratory effort normal. Cardiovascular system: S1 & S2 heard, RRR. Gastrointestinal system: Abdomen is obese, soft and nontender. Normal bowel sounds heard. Central nervous system: Alert and oriented. No focal neurological deficits. Musculoskeletal: Bilateral LE amputations Psychiatry: Judgement and insight appear normal. Mood & affect appropriate.    Data Reviewed: I have personally reviewed following labs and imaging studies  CBC Lab Results  Component Value Date   WBC 4.9 05/27/2023   RBC 3.69 (L) 05/27/2023   HGB 9.5 (L) 05/27/2023   HCT 31.9 (L) 05/27/2023   MCV 86.4 05/27/2023   MCH 25.7 (L) 05/27/2023   PLT 295 05/27/2023   MCHC 29.8 (L) 05/27/2023   RDW 16.1 (H) 05/27/2023   LYMPHSABS 1.9 05/10/2023   MONOABS 0.5 05/10/2023   EOSABS 0.1 05/10/2023   BASOSABS 0.1 05/10/2023     Last metabolic panel Lab Results  Component Value Date   NA 140 05/27/2023   K 4.0 05/27/2023   CL 110 05/27/2023   CO2 26 05/27/2023   BUN 8 05/27/2023   CREATININE 1.04 05/27/2023   GLUCOSE 158 (H) 05/27/2023   GFRNONAA >60 05/27/2023   GFRAA >60 09/20/2016   CALCIUM 7.2 (L) 05/27/2023   PROT 5.3 (L) 05/20/2023   ALBUMIN  <1.5 (L) 05/20/2023   BILITOT 0.5 05/20/2023   ALKPHOS 182 (H) 05/20/2023   AST 12 (L) 05/20/2023   ALT 12 05/20/2023  ANIONGAP 4 (L) 05/27/2023    GFR: Estimated Creatinine Clearance: 130.5 mL/min (by C-G formula based on SCr of 1.04 mg/dL).  No results found for this or any previous visit (from the past 240 hours).    Radiology Studies: DG Abd Portable 1V Result Date: 05/26/2023 CLINICAL DATA:  Low abdominal pain. EXAM:  PORTABLE ABDOMEN - 1 VIEW COMPARISON:  None Available. FINDINGS: There is large amount of stool throughout the colon. No bowel dilatation or evidence of obstruction. No free air or free fluid. The osseous structures are intact. The soft tissues are unremarkable. IMPRESSION: Large colonic stool burden. No bowel obstruction. Electronically Signed   By: Angus Bark M.D.   On: 05/26/2023 15:30      LOS: 50 days    Aneita Keens, MD [facility] 05/27/2023, 10:01 AM   If 7PM-7AM, please contact night-coverage [email or web address]

## 2023-05-27 NOTE — TOC Progression Note (Signed)
 Transition of Care (TOC) - Progression Note    Patient Details  Name: Alan Tapia. MRN: [id number] Date of Birth: 1976/03/01  Transition of Care [facility]) CM/SW Contact  Sherial Dimes, LCSW Phone Number: 05/27/2023, 12:34 PM  Clinical Narrative:    CSW spoke with Eulene Hickman with DSS who noted she has been attempting to reach Brianna [phone]) with social security, but has been unsuccessful. Eulene Hickman requested CSW attempt to speak with SS case worker, VM left. TOC will continue to follow.    Expected Discharge Plan: Skilled Nursing Facility Barriers to Discharge: Insurance Authorization, Continued Medical Work up, SNF Pending bed offer  Expected Discharge Plan and Services In-house Referral: Clinical Social Work     Living arrangements for the past 2 months: Single Family Home Expected Discharge Date: 04/11/23                                     Social Determinants of Health (SDOH) Interventions SDOH Screenings   Food Insecurity: No Food Insecurity (04/07/2023)  Housing: Unknown (04/23/2023)  Recent Concern: Housing - Medium Risk (04/07/2023)  Transportation Needs: No Transportation Needs (04/07/2023)  Recent Concern: Transportation Needs - Unmet Transportation Needs (02/17/2023)   Received from [facility]  Utilities: At Risk (04/07/2023)  Alcohol Screen: Low Risk  (08/20/2022)  Financial Resource Strain: Patient Unable To Answer (08/20/2022)  Tobacco Use: Low Risk  (04/08/2023)    Readmission Risk Interventions    04/07/2023    2:34 PM  Readmission Risk Prevention Plan  Transportation Screening Complete  PCP or Specialist Appt within 3-5 Days Complete  HRI or Home Care Consult Complete  Social Work Consult for Recovery Care Planning/Counseling Complete  Palliative Care Screening Not Applicable  Medication Review Oceanographer) Complete

## 2023-05-27 NOTE — Progress Notes (Signed)
 Occupational Therapy Treatment Patient Details Name: Alan Tapia. MRN: [id number] DOB: 1975-10-02 Today's Date: 05/27/2023   History of present illness The pt is a 48 yo male presenting 11/25 with nausea, vomiting, and fever as well as R foot wound. 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   OT comments  Session focused on flexibility for LB ADL mgmt (including shrinkers), exercises focusing on UE strength and bed mobility independence, as well as use of Maxislide to simulate AP transfers bed level. Pt with good carryover of exercises and ADL mgmt but continues to require extensive assist for scooting attempts (reportedly limited by perineum discomfort and UE weakness today). Discussed use of LB clothing to improve comfort and ability to maneuver on maxislide once clothing laundered by spouse. Will continue to follow acutely.      If plan is discharge home, recommend the following:  Two people to help with walking and/or transfers;Two people to help with bathing/dressing/bathroom;Assistance with cooking/housework;Assist for transportation;Help with stairs or ramp for entrance   Equipment Recommendations  Hoyer lift;Other (comment) (maxislide)    Recommendations for Other Services      Precautions / Restrictions Precautions Precautions: Fall Restrictions Weight Bearing Restrictions Per Provider Order: Yes RLE Weight Bearing Per Provider Order: Non weight bearing Other Position/Activity Restrictions: bilateral BKA (L chronic, R new)       Mobility Bed Mobility Overal bed mobility: Needs Assistance Bed Mobility: Rolling Rolling: Mod assist         General bed mobility comments: Mod A to roll for removal of maxislide    Transfers Overall transfer level: Needs assistance                 General  transfer comment: simulation of AP transfer with Maxislide bed level. Pt unable to push self up enough to scoot backwards with tendency to lean back. Use of maxislide and +2 to scoot back in bed with Total A needed w/ minimal muscle engagement by pt (lost balance first attempt with assist to correct but improved on second attempt in scooting back in bed).     Balance Overall balance assessment: Needs assistance Sitting-balance support: Feet unsupported, No upper extremity supported Sitting balance-Leahy Scale: Fair                                     ADL either performed or assessed with clinical judgement   ADL Overall ADL's : Needs assistance/impaired                     Lower Body Dressing: Moderate assistance;Bed level Lower Body Dressing Details (indicate cue type and reason): donning shrinkers bed level; pt able to reach end of limb but difficulty lifting LEs. assisted to start shrinker over limb with pt able to pull up in long sitting bed level               General ADL Comments: Emphasis on techniques to practice scooting backwards in prep for AP transfers via maxislide in bed though relatively unsuccessful. Also reinforced UE exercises and bed features for therapeutic exercises    Extremity/Trunk Assessment Upper Extremity Assessment Upper Extremity Assessment: Overall WFL for tasks assessed;Right hand dominant   Lower Extremity Assessment Lower Extremity Assessment: Defer  to PT evaluation        Vision   Vision Assessment?: No apparent visual deficits   Perception     Praxis      Cognition Arousal: Alert Behavior During Therapy: WFL for tasks assessed/performed Overall Cognitive Status: Within Functional Limits for tasks assessed                                          Exercises Exercises: Other exercises, General Upper Extremity General Exercises - Upper Extremity Elbow Flexion: 20 reps, Strengthening, Both,  Theraband Theraband Level (Elbow Flexion): Level 3 (Green) Elbow Extension: Strengthening, Both, 20 reps, Theraband Theraband Level (Elbow Extension): Level 3 (Green) Other Exercises Other Exercises: rows 2 x 15 with theraband at footboard Other Exercises: tricep extension bed level (pushing up from bed) Other Exercises: pulling on bedrails to long sitting    Shoulder Instructions       General Comments      Pertinent Vitals/ Pain       Pain Assessment Pain Assessment: Faces Faces Pain Scale: Hurts little more Pain Location: stomach, perineum, B residual limbs Pain Descriptors / Indicators: Discomfort Pain Intervention(s): Monitored during session, Limited activity within patient's tolerance  Home Living                                          Prior Functioning/Environment              Frequency  Min 1X/week        Progress Toward Goals  OT Goals(current goals can now be found in the care plan section)  Progress towards OT goals: Progressing toward goals  Acute Rehab OT Goals Patient Stated Goal: get in wheelchair and propel around unit OT Goal Formulation: With patient Time For Goal Achievement: 06/04/23 Potential to Achieve Goals: Good ADL Goals Pt Will Perform Grooming: with modified independence;sitting Pt Will Perform Upper Body Bathing: with set-up;sitting Pt Will Perform Upper Body Dressing: with modified independence;sitting Pt Will Perform Lower Body Dressing: with min assist;sitting/lateral leans;bed level Pt Will Transfer to Toilet: with mod assist;with +2 assist;bedside commode;anterior/posterior transfer Pt Will Perform Toileting - Clothing Manipulation and hygiene: with min assist;sitting/lateral leans;bed level Pt/caregiver will Perform Home Exercise Program: Increased strength;Both right and left upper extremity;With theraband;Independently;With written HEP provided Additional ADL Goal #1: Pt to don B shrinkers independently   Plan      Co-evaluation                 AM-PAC OT "6 Clicks" Daily Activity     Outcome Measure   Help from another person eating meals?: A Little Help from another person taking care of personal grooming?: A Little Help from another person toileting, which includes using toliet, bedpan, or urinal?: A Lot Help from another person bathing (including washing, rinsing, drying)?: A Lot Help from another person to put on and taking off regular upper body clothing?: A Little Help from another person to put on and taking off regular lower body clothing?: A Lot 6 Click Score: 15    End of Session    OT Visit Diagnosis: Other abnormalities of gait and mobility (R26.89);Muscle weakness (generalized) (M62.81);Other (comment)   Activity Tolerance Patient tolerated treatment well   Patient Left in bed;with call bell/phone within reach;with family/visitor present  Nurse Communication Mobility status        Time: 1610-9604 OT Time Calculation (min): 46 min  Charges: OT General Charges $OT Visit: 1 Visit OT Treatments $Self Care/Home Management : 8-22 mins $Therapeutic Activity: 8-22 mins $Therapeutic Exercise: 8-22 mins  Lawrence Pretty, OTR/L Acute Rehab Services Office: [phone]   Annabella Barr 05/27/2023, 2:37 PM

## 2023-05-27 NOTE — Plan of Care (Signed)
   Problem: Activity: Goal: Risk for activity intolerance will decrease Outcome: Progressing   Problem: Nutrition: Goal: Adequate nutrition will be maintained Outcome: Progressing

## 2023-05-28 DIAGNOSIS — D649 Anemia, unspecified: Secondary | ICD-10-CM | POA: Diagnosis not present

## 2023-05-28 DIAGNOSIS — E440 Moderate protein-calorie malnutrition: Secondary | ICD-10-CM | POA: Diagnosis not present

## 2023-05-28 DIAGNOSIS — I10 Essential (primary) hypertension: Secondary | ICD-10-CM | POA: Diagnosis not present

## 2023-05-28 DIAGNOSIS — M86271 Subacute osteomyelitis, right ankle and foot: Secondary | ICD-10-CM | POA: Diagnosis not present

## 2023-05-28 NOTE — Progress Notes (Signed)
PROGRESS NOTE    Alan Tapia.  YNW:[id number] DOB: 1976-02-10 DOA: 04/06/2023 PCP: Patient, No Pcp Per   Brief Narrative: 48 year old male with past medical history significant for left lower extremity BKA, dilated cardiomyopathy, history of combined systolic and diastolic heart failure with EF 35 to 40%, hypertension, insulin-dependent diabetes, peripheral neuropathy, hyperlipidemia who presented to the emergency department on 04/06/2023 with complaints of odor, wound of his foot. He was also having nausea, vomiting and diarrhea. Orthopedic surgery was consulted and patient underwent right BKA 11/27 by Dr. Lajoyce Corners.   Assessment and Plan:  Sepsis Present on admission, secondary to right foot osteomyelitis. Blood cultures with no growth.  Right foot osteomyelitis s/p right BKA Patient was initially treated empirically with antibiotics. Orthopedic surgery performed a right BKA on 11/27. Antibiotics discontinued.  CKD stage II Stable.  Chronic combined systolic and diastolic heart failure Stable. -Continue Toprol XL and Entresto  Primary hypertension -Continue Toprol XL and Entresto  Diarrhea Resolved.  Community acquired pneumonia Patient treated with Zosyn and doxycycline. Resolved.  Acute on chronic anemia Patient with hemoglobin drop down to 5.9 g/dL secondary to right foot wound, requiring 3 units of PRBC on 11/26. Patient requiring another 1 unit of PRBC on 11/30. Stable.  COVID-19 infection Patient completed isolation protocol. Patient treated with Paxlovid.  Constipation -Continue bowel regimen  Pressure injury Bilateral perineum, unclear if present on admission.   DVT prophylaxis: Lovenox Code Status:   Code Status: Full Code Family Communication: Wife at bedside Disposition Plan: Discharge to SNF once bed is available   Consultants:    Procedures:    Antimicrobials:     Subjective: Abdominal pain is improving. No other  concerns.  Objective: BP (!) 160/85 (BP Location: Right Arm)   Pulse 79   Temp 97.8 F (36.6 C) (Oral)   Resp 18   Ht 6\' 4"  (1.93 m)   Wt 132.6 kg   SpO2 100%   BMI 35.58 kg/m   Examination:  General exam: Appears calm and comfortable Respiratory system: Clear to auscultation. Respiratory effort normal. Cardiovascular system: S1 & S2 heard, RRR. No murmurs. Gastrointestinal system: Abdomen is nondistended, soft and nontender. Normal bowel sounds heard. Central nervous system: Alert and oriented. No focal neurological deficits. Musculoskeletal: Bilateral BKAs Psychiatry: Judgement and insight appear normal. Mood & affect appropriate.    Data Reviewed: I have personally reviewed following labs and imaging studies  CBC Lab Results  Component Value Date   WBC 4.9 05/27/2023   RBC 3.69 (L) 05/27/2023   HGB 9.5 (L) 05/27/2023   HCT 31.9 (L) 05/27/2023   MCV 86.4 05/27/2023   MCH 25.7 (L) 05/27/2023   PLT 295 05/27/2023   MCHC 29.8 (L) 05/27/2023   RDW 16.1 (H) 05/27/2023   LYMPHSABS 1.9 05/10/2023   MONOABS 0.5 05/10/2023   EOSABS 0.1 05/10/2023   BASOSABS 0.1 05/10/2023     Last metabolic panel Lab Results  Component Value Date   NA 140 05/27/2023   K 4.0 05/27/2023   CL 110 05/27/2023   CO2 26 05/27/2023   BUN 8 05/27/2023   CREATININE 1.04 05/27/2023   GLUCOSE 158 (H) 05/27/2023   GFRNONAA >60 05/27/2023   GFRAA >60 09/20/2016   CALCIUM 7.2 (L) 05/27/2023   PROT 5.3 (L) 05/20/2023   ALBUMIN <1.5 (L) 05/20/2023   BILITOT 0.5 05/20/2023   ALKPHOS 182 (H) 05/20/2023   AST 12 (L) 05/20/2023   ALT 12 05/20/2023   ANIONGAP 4 (L) 05/27/2023  GFR: Estimated Creatinine Clearance: 130.5 mL/min (by C-G formula based on SCr of 1.04 mg/dL).  No results found for this or any previous visit (from the past 240 hours).    Radiology Studies: No results found.     LOS: 51 days    Jacquelin Hawking, MD [facility] 05/28/2023, 12:22 PM   If 7PM-7AM,  please contact night-coverage [email or web address]

## 2023-05-28 NOTE — Progress Notes (Signed)
Physical Therapy Treatment Patient Details Name: Alan Tapia. MRN: [id number] DOB: [birth year] Today's Date: 05/28/2023   History of Present Illness The pt is a 48 yo male presenting 11/25 with nausea, vomiting, and fever as well as R foot wound. 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    PT Comments  Session focused on transfer training from bed <> wheelchair. MaxA for donning shorts in supine position by rolling R/L. Pt performed anterior posterior transfer with +3 assist for safety, utilizing maxi slide. Pt will benefit from a wheelchair with elevating legrests to maintain optimal positioning for residual limbs to progress wheelchair mobility. Barriers remain obtaining appropriate DME for pt size and comfort due to perineal breakdown.    If plan is discharge home, recommend the following: Two people to help with walking and/or transfers;Assist for transportation;Assistance with cooking/housework;Help with stairs or ramp for entrance   Can travel by private vehicle     No  Equipment Recommendations  Hoyer lift;Wheelchair cushion (measurements PT);Wheelchair (measurements PT);Hospital bed    Recommendations for Other Services       Precautions / Restrictions Precautions Precautions: Fall Restrictions Weight Bearing Restrictions Per Provider Order: Yes RLE Weight Bearing Per Provider Order: Non weight bearing Other Position/Activity Restrictions: bilateral BKA (L chronic, R new)     Mobility  Bed Mobility Overal bed mobility: Needs Assistance Bed Mobility: Rolling Rolling: Mod assist   Supine to sit: Mod assist     General bed mobility comments: Mod A to roll for placement and removal of soiled pads and maxi slide. HHA to support trunk to sit upright    Transfers Overall transfer level: Needs  assistance Equipment used: None           Anterior-Posterior transfers: Max assist (+3 safety)   General transfer comment: Maxislide from bed to wheelchair with pt pants donned. Cues for hand placement and reciprocal scooting to and from wheelchair. +3 for safety    Ambulation/Gait                   Stairs             Wheelchair Mobility     Tilt Bed    Modified Rankin (Stroke Patients Only)       Balance Overall balance assessment: Needs assistance Sitting-balance support: Feet unsupported, No upper extremity supported Sitting balance-Leahy Scale: Fair                                      Cognition Arousal: Alert Behavior During Therapy: WFL for tasks assessed/performed Overall Cognitive Status: Within Functional Limits for tasks assessed                                          Exercises      General Comments        Pertinent Vitals/Pain Pain Assessment Pain Assessment: Faces Faces Pain Scale: Hurts a little bit Pain Location: stomach, perineum, B residual limbs Pain Descriptors / Indicators: Discomfort Pain Intervention(s): Monitored during session    Home Living  Prior Function            PT Goals (current goals can now be found in the care plan section) Acute Rehab PT Goals Potential to Achieve Goals: Fair    Frequency    Min 1X/week      PT Plan      Co-evaluation              AM-PAC PT "6 Clicks" Mobility   Outcome Measure  Help needed turning from your back to your side while in a flat bed without using bedrails?: A Lot Help needed moving from lying on your back to sitting on the side of a flat bed without using bedrails?: A Lot Help needed moving to and from a bed to a chair (including a wheelchair)?: Total Help needed standing up from a chair using your arms (e.g., wheelchair or bedside chair)?: Total Help needed to walk in hospital  room?: Total Help needed climbing 3-5 steps with a railing? : Total 6 Click Score: 8    End of Session   Activity Tolerance: Patient tolerated treatment well Patient left: in bed;with call bell/phone within reach Nurse Communication: Mobility status PT Visit Diagnosis: Other abnormalities of gait and mobility (R26.89);Muscle weakness (generalized) (M62.81);Pain Pain - Right/Left: Right Pain - part of body: Leg     Time: 1433-1530 PT Time Calculation (min) (ACUTE ONLY): 57 min  Charges:    $Therapeutic Activity: 53-67 mins PT General Charges $$ ACUTE PT VISIT: 1 Visit                     Lillia Pauls, PT, DPT Acute Rehabilitation Services Office [phone]    Norval Morton 05/28/2023, 4:00 PM

## 2023-05-29 DIAGNOSIS — I10 Essential (primary) hypertension: Secondary | ICD-10-CM | POA: Diagnosis not present

## 2023-05-29 DIAGNOSIS — M86271 Subacute osteomyelitis, right ankle and foot: Secondary | ICD-10-CM | POA: Diagnosis not present

## 2023-05-29 DIAGNOSIS — D649 Anemia, unspecified: Secondary | ICD-10-CM | POA: Diagnosis not present

## 2023-05-29 DIAGNOSIS — E440 Moderate protein-calorie malnutrition: Secondary | ICD-10-CM | POA: Diagnosis not present

## 2023-05-29 NOTE — Plan of Care (Signed)
  Problem: Fluid Volume: Goal: Hemodynamic stability will improve Outcome: Progressing   Problem: Clinical Measurements: Goal: Diagnostic test results will improve Outcome: Progressing Goal: Signs and symptoms of infection will decrease Outcome: Progressing   Problem: Respiratory: Goal: Ability to maintain adequate ventilation will improve Outcome: Progressing   Problem: Health Behavior/Discharge Planning: Goal: Ability to manage health-related needs will improve Outcome: Progressing   Problem: Clinical Measurements: Goal: Ability to maintain clinical measurements within normal limits will improve Outcome: Progressing Goal: Diagnostic test results will improve Outcome: Progressing Goal: Respiratory complications will improve Outcome: Progressing Goal: Cardiovascular complication will be avoided Outcome: Progressing   Problem: Activity: Goal: Risk for activity intolerance will decrease Outcome: Progressing   Problem: Nutrition: Goal: Adequate nutrition will be maintained Outcome: Progressing   Problem: Coping: Goal: Level of anxiety will decrease Outcome: Progressing   Problem: Elimination: Goal: Will not experience complications related to bowel motility Outcome: Progressing

## 2023-05-29 NOTE — Progress Notes (Signed)
PROGRESS NOTE    Alan Tapia.  ZOX:[id number] DOB: 12-Jan-1976 DOA: 04/06/2023 PCP: Patient, No Pcp Per   Brief Narrative: 48 year old male with past medical history significant for left lower extremity BKA, dilated cardiomyopathy, history of combined systolic and diastolic heart failure with EF 35 to 40%, hypertension, insulin-dependent diabetes, peripheral neuropathy, hyperlipidemia who presented to the emergency department on 04/06/2023 with complaints of odor, wound of his foot. He was also having nausea, vomiting and diarrhea. Orthopedic surgery was consulted and patient underwent right BKA 11/27 by Dr. Lajoyce Corners.   Assessment and Plan:  Sepsis Present on admission, secondary to right foot osteomyelitis. Blood cultures with no growth.  Right foot osteomyelitis s/p right BKA Patient was initially treated empirically with antibiotics. Orthopedic surgery performed a right BKA on 11/27. Antibiotics discontinued.  CKD stage II Stable.  Chronic combined systolic and diastolic heart failure Stable. -Continue Toprol XL and Entresto  Primary hypertension -Continue Toprol XL and Entresto  Diarrhea Resolved.  Community acquired pneumonia Patient treated with Zosyn and doxycycline. Resolved.  Acute on chronic anemia Patient with hemoglobin drop down to 5.9 g/dL secondary to right foot wound, requiring 3 units of PRBC on 11/26. Patient requiring another 1 unit of PRBC on 11/30. Stable.  COVID-19 infection Patient completed isolation protocol. Patient treated with Paxlovid.  Constipation -Continue bowel regimen  Pressure injury Bilateral perineum, unclear if present on admission.   DVT prophylaxis: Lovenox Code Status:   Code Status: Full Code Family Communication: Wife at bedside Disposition Plan: Discharge to SNF once bed is available   Consultants:    Procedures:    Antimicrobials:     Subjective: No issues this morning.  Objective: BP (!) 153/81 (BP  Location: Right Arm)   Pulse 80   Temp 98 F (36.7 C) (Oral)   Resp 16   Ht 6\' 4"  (1.93 m)   Wt 132.6 kg   SpO2 96%   BMI 35.58 kg/m   Examination:  General exam: Appears calm and comfortable Respiratory system: Clear to auscultation. Respiratory effort normal. Cardiovascular system: S1 & S2 heard, RRR.  Gastrointestinal system: Abdomen is nondistended, soft and nontender. Normal bowel sounds heard. Central nervous system: Alert and oriented. No focal neurological deficits. Psychiatry: Judgement and insight appear normal. Mood & affect appropriate.    Data Reviewed: I have personally reviewed following labs and imaging studies  CBC Lab Results  Component Value Date   WBC 4.9 05/27/2023   RBC 3.69 (L) 05/27/2023   HGB 9.5 (L) 05/27/2023   HCT 31.9 (L) 05/27/2023   MCV 86.4 05/27/2023   MCH 25.7 (L) 05/27/2023   PLT 295 05/27/2023   MCHC 29.8 (L) 05/27/2023   RDW 16.1 (H) 05/27/2023   LYMPHSABS 1.9 05/10/2023   MONOABS 0.5 05/10/2023   EOSABS 0.1 05/10/2023   BASOSABS 0.1 05/10/2023     Last metabolic panel Lab Results  Component Value Date   NA 140 05/27/2023   K 4.0 05/27/2023   CL 110 05/27/2023   CO2 26 05/27/2023   BUN 8 05/27/2023   CREATININE 1.04 05/27/2023   GLUCOSE 158 (H) 05/27/2023   GFRNONAA >60 05/27/2023   GFRAA >60 09/20/2016   CALCIUM 7.2 (L) 05/27/2023   PROT 5.3 (L) 05/20/2023   ALBUMIN <1.5 (L) 05/20/2023   BILITOT 0.5 05/20/2023   ALKPHOS 182 (H) 05/20/2023   AST 12 (L) 05/20/2023   ALT 12 05/20/2023   ANIONGAP 4 (L) 05/27/2023    GFR: Estimated Creatinine Clearance: 130.5 mL/min (by  C-G formula based on SCr of 1.04 mg/dL).  No results found for this or any previous visit (from the past 240 hours).    Radiology Studies: No results found.     LOS: 52 days    Jacquelin Hawking, MD [facility] 05/29/2023, 11:55 AM   If 7PM-7AM, please contact night-coverage [email or web address]

## 2023-05-29 NOTE — TOC Progression Note (Signed)
Transition of Care (TOC) - Progression Note    Patient Details  Name: Alan Tapia. MRN: [id number] Date of Birth: 1976/04/07  [facility]) CM/SW Contact  Carley Hammed, LCSW Phone Number: 05/29/2023, 11:35 AM  Clinical Narrative:    CSW and DSS SW unable to contact Social Security case worker to discuss pt's pending disability, main barrier to placement. Many VM's left. TOC will continue to follow for DC needs.    Expected Discharge Plan: Skilled Nursing Facility Barriers to Discharge: Insurance Authorization, Continued Medical Work up, SNF Pending bed offer  Expected Discharge Plan and Services In-house Referral: Clinical Social Work     Living arrangements for the past 2 months: Single Family Home Expected Discharge Date: 04/11/23                                     Social Determinants of Health (SDOH) Interventions SDOH Screenings   Food Insecurity: No Food Insecurity (04/07/2023)  Housing: Unknown (04/23/2023)  Recent Concern: Housing - Medium Risk (04/07/2023)  Transportation Needs: No Transportation Needs (04/07/2023)  Recent Concern: Transportation Needs - Unmet Transportation Needs (02/17/2023)   Received from [facility]  Utilities: At Risk (04/07/2023)  Alcohol Screen: Low Risk  (08/20/2022)  Financial Resource Strain: Patient Unable To Answer (08/20/2022)  Tobacco Use: Low Risk  (04/08/2023)    Readmission Risk Interventions    04/07/2023    2:34 PM  Readmission Risk Prevention Plan  Transportation Screening Complete  PCP or Specialist Appt within 3-5 Days Complete  HRI or Home Care Consult Complete  Social Work Consult for Recovery Care Planning/Counseling Complete  Palliative Care Screening Not Applicable  Medication Review Oceanographer) Complete

## 2023-05-30 DIAGNOSIS — M86271 Subacute osteomyelitis, right ankle and foot: Secondary | ICD-10-CM | POA: Diagnosis not present

## 2023-05-30 DIAGNOSIS — I10 Essential (primary) hypertension: Secondary | ICD-10-CM | POA: Diagnosis not present

## 2023-05-30 DIAGNOSIS — E440 Moderate protein-calorie malnutrition: Secondary | ICD-10-CM | POA: Diagnosis not present

## 2023-05-30 DIAGNOSIS — D649 Anemia, unspecified: Secondary | ICD-10-CM | POA: Diagnosis not present

## 2023-05-30 MED ORDER — HYDROCORTISONE ACETATE 25 MG RE SUPP
25.0000 mg | Freq: Two times a day (BID) | RECTAL | Status: DC
  Administered 2023-05-30 – 2023-05-31 (×2): 25 mg via RECTAL
  Filled 2023-05-30 (×2): qty 1

## 2023-05-30 NOTE — Plan of Care (Signed)
 Problem: Fluid Volume: Goal: Hemodynamic stability will improve Outcome: Progressing   Problem: Clinical Measurements: Goal: Diagnostic test results will improve Outcome: Progressing Goal: Signs and symptoms of infection will decrease Outcome: Progressing   Problem: Respiratory: Goal: Ability to maintain adequate ventilation will improve Outcome: Progressing   Problem: Education: Goal: Knowledge of General Education information will improve Description: Including pain rating scale, medication(s)/side effects and non-pharmacologic comfort measures Outcome: Progressing   Problem: Health Behavior/Discharge Planning: Goal: Ability to manage health-related needs will improve Outcome: Progressing   Problem: Clinical Measurements: Goal: Ability to maintain clinical measurements within normal limits will improve Outcome: Progressing Goal: Will remain free from infection Outcome: Progressing Goal: Diagnostic test results will improve Outcome: Progressing Goal: Respiratory complications will improve Outcome: Progressing Goal: Cardiovascular complication will be avoided Outcome: Progressing   Problem: Activity: Goal: Risk for activity intolerance will decrease Outcome: Progressing   Problem: Nutrition: Goal: Adequate nutrition will be maintained Outcome: Progressing   Problem: Coping: Goal: Level of anxiety will decrease Outcome: Progressing   Problem: Elimination: Goal: Will not experience complications related to bowel motility Outcome: Progressing Goal: Will not experience complications related to urinary retention Outcome: Progressing   Problem: Pain Management: Goal: General experience of comfort will improve Outcome: Progressing   Problem: Safety: Goal: Ability to remain free from injury will improve Outcome: Progressing   Problem: Skin Integrity: Goal: Risk for impaired skin integrity will decrease Outcome: Progressing   Problem: Activity: Goal: Ability  to tolerate increased activity will improve Outcome: Progressing   Problem: Clinical Measurements: Goal: Ability to maintain a body temperature in the normal range will improve Outcome: Progressing   Problem: Respiratory: Goal: Ability to maintain adequate ventilation will improve Outcome: Progressing Goal: Ability to maintain a clear airway will improve Outcome: Progressing   Problem: Education: Goal: Knowledge of the prescribed therapeutic regimen will improve Outcome: Progressing Goal: Ability to verbalize activity precautions or restrictions will improve Outcome: Progressing Goal: Understanding of discharge needs will improve Outcome: Progressing   Problem: Activity: Goal: Ability to perform//tolerate increased activity and mobilize with assistive devices will improve Outcome: Progressing   Problem: Clinical Measurements: Goal: Postoperative complications will be avoided or minimized Outcome: Progressing   Problem: Self-Care: Goal: Ability to meet self-care needs will improve Outcome: Progressing   Problem: Self-Concept: Goal: Ability to maintain and perform role responsibilities to the fullest extent possible will improve Outcome: Progressing   Problem: Education: Goal: Knowledge of General Education information will improve Description: Including pain rating scale, medication(s)/side effects and non-pharmacologic comfort measures Outcome: Progressing   Problem: Health Behavior/Discharge Planning: Goal: Ability to manage health-related needs will improve Outcome: Progressing   Problem: Clinical Measurements: Goal: Ability to maintain clinical measurements within normal limits will improve Outcome: Progressing Goal: Will remain free from infection Outcome: Progressing Goal: Diagnostic test results will improve Outcome: Progressing Goal: Respiratory complications will improve Outcome: Progressing Goal: Cardiovascular complication will be avoided Outcome:  Progressing   Problem: Activity: Goal: Risk for activity intolerance will decrease Outcome: Progressing   Problem: Nutrition: Goal: Adequate nutrition will be maintained Outcome: Progressing   Problem: Coping: Goal: Level of anxiety will decrease Outcome: Progressing   Problem: Elimination: Goal: Will not experience complications related to bowel motility Outcome: Progressing Goal: Will not experience complications related to urinary retention Outcome: Progressing   Problem: Pain Management: Goal: General experience of comfort will improve Outcome: Progressing   Problem: Safety: Goal: Ability to remain free from injury will improve Outcome: Progressing   Problem: Skin Integrity: Goal: Risk for  impaired skin integrity will decrease Outcome: Progressing

## 2023-05-30 NOTE — Progress Notes (Signed)
PROGRESS NOTE    Alan Tapia.  ZOX:[id number] DOB: 06/01/1975 DOA: 04/06/2023 PCP: Patient, No Pcp Per   Brief Narrative: 48 year old male with past medical history significant for left lower extremity BKA, dilated cardiomyopathy, history of combined systolic and diastolic heart failure with EF 35 to 40%, hypertension, insulin-dependent diabetes, peripheral neuropathy, hyperlipidemia who presented to the emergency department on 04/06/2023 with complaints of odor, wound of his foot. He was also having nausea, vomiting and diarrhea. Orthopedic surgery was consulted and patient underwent right BKA 11/27 by Dr. Lajoyce Corners.   Assessment and Plan:  Sepsis Present on admission, secondary to right foot osteomyelitis. Blood cultures with no growth.  Right foot osteomyelitis s/p right BKA Patient was initially treated empirically with antibiotics. Orthopedic surgery performed a right BKA on 11/27. Antibiotics discontinued.  CKD stage II Stable.  Chronic combined systolic and diastolic heart failure Stable. -Continue Toprol XL and Entresto  Primary hypertension -Continue Toprol XL and Entresto  Diarrhea Resolved.  Community acquired pneumonia Patient treated with Zosyn and doxycycline. Resolved.  Acute on chronic anemia Patient with hemoglobin drop down to 5.9 g/dL secondary to right foot wound, requiring 3 units of PRBC on 11/26. Patient requiring another 1 unit of PRBC on 11/30. Stable.  COVID-19 infection Patient completed isolation protocol. Patient treated with Paxlovid.  Constipation -Continue bowel regimen  Pressure injury Bilateral perineum, unclear if present on admission.   DVT prophylaxis: Lovenox Code Status:   Code Status: Full Code Family Communication: Wife at bedside Disposition Plan: Discharge to SNF once bed is available   Consultants:    Procedures:    Antimicrobials:     Subjective: No issues overnight. No concerns this morning. Hoping to  get to inpatient rehab.  Objective: BP (!) 147/77   Pulse 74   Temp 98.5 F (36.9 C) (Oral)   Resp 16   Ht 6\' 4"  (1.93 m)   Wt 132.4 kg   SpO2 98%   BMI 35.53 kg/m   Examination:  General exam: Appears calm and comfortable   Data Reviewed: I have personally reviewed following labs and imaging studies  CBC Lab Results  Component Value Date   WBC 4.9 05/27/2023   RBC 3.69 (L) 05/27/2023   HGB 9.5 (L) 05/27/2023   HCT 31.9 (L) 05/27/2023   MCV 86.4 05/27/2023   MCH 25.7 (L) 05/27/2023   PLT 295 05/27/2023   MCHC 29.8 (L) 05/27/2023   RDW 16.1 (H) 05/27/2023   LYMPHSABS 1.9 05/10/2023   MONOABS 0.5 05/10/2023   EOSABS 0.1 05/10/2023   BASOSABS 0.1 05/10/2023     Last metabolic panel Lab Results  Component Value Date   NA 140 05/27/2023   K 4.0 05/27/2023   CL 110 05/27/2023   CO2 26 05/27/2023   BUN 8 05/27/2023   CREATININE 1.04 05/27/2023   GLUCOSE 158 (H) 05/27/2023   GFRNONAA >60 05/27/2023   GFRAA >60 09/20/2016   CALCIUM 7.2 (L) 05/27/2023   PROT 5.3 (L) 05/20/2023   ALBUMIN <1.5 (L) 05/20/2023   BILITOT 0.5 05/20/2023   ALKPHOS 182 (H) 05/20/2023   AST 12 (L) 05/20/2023   ALT 12 05/20/2023   ANIONGAP 4 (L) 05/27/2023    GFR: Estimated Creatinine Clearance: 130.4 mL/min (by C-G formula based on SCr of 1.04 mg/dL).  No results found for this or any previous visit (from the past 240 hours).    Radiology Studies: No results found.     LOS: 53 days    Jacquelin Hawking, MD  [facility] 05/30/2023, 12:30 PM   If 7PM-7AM, please contact night-coverage [email or web address]

## 2023-05-30 NOTE — Plan of Care (Signed)
  Problem: Clinical Measurements: Goal: Diagnostic test results will improve Outcome: Progressing Goal: Signs and symptoms of infection will decrease Outcome: Progressing   Problem: Education: Goal: Knowledge of General Education information will improve Description: Including pain rating scale, medication(s)/side effects and non-pharmacologic comfort measures Outcome: Progressing   Problem: Nutrition: Goal: Adequate nutrition will be maintained Outcome: Progressing   Problem: Coping: Goal: Level of anxiety will decrease Outcome: Progressing

## 2023-05-31 DIAGNOSIS — D649 Anemia, unspecified: Secondary | ICD-10-CM | POA: Diagnosis not present

## 2023-05-31 DIAGNOSIS — I10 Essential (primary) hypertension: Secondary | ICD-10-CM | POA: Diagnosis not present

## 2023-05-31 DIAGNOSIS — M86271 Subacute osteomyelitis, right ankle and foot: Secondary | ICD-10-CM | POA: Diagnosis not present

## 2023-05-31 DIAGNOSIS — E440 Moderate protein-calorie malnutrition: Secondary | ICD-10-CM | POA: Diagnosis not present

## 2023-05-31 MED ORDER — HYDROCORTISONE (PERIANAL) 2.5 % EX CREA
TOPICAL_CREAM | Freq: Two times a day (BID) | CUTANEOUS | Status: AC
  Filled 2023-05-31: qty 28.35

## 2023-05-31 NOTE — Plan of Care (Signed)
  Problem: Pain Management: Goal: General experience of comfort will improve Outcome: Progressing   Problem: Safety: Goal: Ability to remain free from injury will improve Outcome: Progressing   Problem: Skin Integrity: Goal: Risk for impaired skin integrity will decrease Outcome: Progressing   Problem: Education: Goal: Knowledge of General Education information will improve Description: Including pain rating scale, medication(s)/side effects and non-pharmacologic comfort measures Outcome: Progressing

## 2023-05-31 NOTE — Plan of Care (Signed)
  Problem: Pain Management: Goal: General experience of comfort will improve Outcome: Progressing   Problem: Safety: Goal: Ability to remain free from injury will improve Outcome: Progressing   Problem: Skin Integrity: Goal: Risk for impaired skin integrity will decrease Outcome: Progressing   Problem: Coping: Goal: Level of anxiety will decrease Outcome: Progressing

## 2023-05-31 NOTE — Progress Notes (Signed)
PROGRESS NOTE    Alan Tapia.  QIO:[id number] DOB: [birth year] DOA: 04/06/2023 PCP: Patient, No Pcp Per   Brief Narrative: 48 year old male with past medical history significant for left lower extremity BKA, dilated cardiomyopathy, history of combined systolic and diastolic heart failure with EF 35 to 40%, hypertension, insulin-dependent diabetes, peripheral neuropathy, hyperlipidemia who presented to the emergency department on 04/06/2023 with complaints of odor, wound of his foot. He was also having nausea, vomiting and diarrhea. Orthopedic surgery was consulted and patient underwent right BKA 11/27 by Dr. Lajoyce Corners.   Assessment and Plan:  Sepsis Present on admission, secondary to right foot osteomyelitis. Blood cultures with no growth.  Right foot osteomyelitis s/p right BKA Patient was initially treated empirically with antibiotics. Orthopedic surgery performed a right BKA on 11/27. Antibiotics discontinued.  CKD stage II Stable.  Chronic combined systolic and diastolic heart failure Stable. -Continue Toprol XL and Entresto  Primary hypertension -Continue Toprol XL and Entresto  Diarrhea Resolved.  Abdominal edema Mostly to left side of abdomen with resultant fluid filled striae and mild dermatitis. Patient declining diuresis at this time. -Change positioning in bed to offload fluid from left side of abdomen  Hemorrhoids Anusol cream  Community acquired pneumonia Patient treated with Zosyn and doxycycline. Resolved.  Acute on chronic anemia Patient with hemoglobin drop down to 5.9 g/dL secondary to right foot wound, requiring 3 units of PRBC on 11/26. Patient requiring another 1 unit of PRBC on 11/30. Stable.  COVID-19 infection Patient completed isolation protocol. Patient treated with Paxlovid.  Constipation -Continue bowel regimen  Pressure injury Bilateral perineum, unclear if present on admission.   DVT prophylaxis: Lovenox Code Status:   Code  Status: Full Code Family Communication: Wife at bedside Disposition Plan: Discharge to SNF once bed is available   Consultants:  Orthopedic surgery  Procedures:  Right below knee amputation  Antimicrobials: Paxlovid Vancomycin Zosyn Flagyl Doxycycline Cefepime   Subjective: Some improvement of rectal/perineal pain with steroid suppositories, however he is not tolerating the suppositories well. He also notices left sided abdominal swelling.  Objective: BP (!) 146/80   Pulse 74   Temp 98.1 F (36.7 C) (Oral)   Resp 19   Ht 6\' 4"  (1.93 m)   Wt 132.4 kg   SpO2 99%   BMI 35.53 kg/m   Examination:  General exam: Appears calm and comfortable Respiratory system: Clear to auscultation. Respiratory effort normal. Cardiovascular system: S1 & S2 heard, RRR. No murmurs, rubs, gallops or clicks. Gastrointestinal system: Abdomen is obese, soft and nontender. Normal bowel sounds heard. Fluid filled striae with edema noted on left side Central nervous system: Alert and oriented. No focal neurological deficits. Musculoskeletal: Bilateral BKA Psychiatry: Judgement and insight appear normal. Mood & affect appropriate.    Data Reviewed: I have personally reviewed following labs and imaging studies  CBC Lab Results  Component Value Date   WBC 4.9 05/27/2023   RBC 3.69 (L) 05/27/2023   HGB 9.5 (L) 05/27/2023   HCT 31.9 (L) 05/27/2023   MCV 86.4 05/27/2023   MCH 25.7 (L) 05/27/2023   PLT 295 05/27/2023   MCHC 29.8 (L) 05/27/2023   RDW 16.1 (H) 05/27/2023   LYMPHSABS 1.9 05/10/2023   MONOABS 0.5 05/10/2023   EOSABS 0.1 05/10/2023   BASOSABS 0.1 05/10/2023     Last metabolic panel Lab Results  Component Value Date   NA 140 05/27/2023   K 4.0 05/27/2023   CL 110 05/27/2023   CO2 26 05/27/2023   BUN  8 05/27/2023   CREATININE 1.04 05/27/2023   GLUCOSE 158 (H) 05/27/2023   GFRNONAA >60 05/27/2023   GFRAA >60 09/20/2016   CALCIUM 7.2 (L) 05/27/2023   PROT 5.3 (L)  05/20/2023   ALBUMIN <1.5 (L) 05/20/2023   BILITOT 0.5 05/20/2023   ALKPHOS 182 (H) 05/20/2023   AST 12 (L) 05/20/2023   ALT 12 05/20/2023   ANIONGAP 4 (L) 05/27/2023    GFR: Estimated Creatinine Clearance: 130.4 mL/min (by C-G formula based on SCr of 1.04 mg/dL).  No results found for this or any previous visit (from the past 240 hours).    Radiology Studies: No results found.     LOS: 54 days    Jacquelin Hawking, MD [facility] 05/31/2023, 1:13 PM   If 7PM-7AM, please contact night-coverage [email or web address]

## 2023-05-31 NOTE — Plan of Care (Signed)
 Problem: Fluid Volume: Goal: Hemodynamic stability will improve Outcome: Progressing   Problem: Clinical Measurements: Goal: Diagnostic test results will improve Outcome: Progressing Goal: Signs and symptoms of infection will decrease Outcome: Progressing   Problem: Respiratory: Goal: Ability to maintain adequate ventilation will improve Outcome: Progressing   Problem: Education: Goal: Knowledge of General Education information will improve Description: Including pain rating scale, medication(s)/side effects and non-pharmacologic comfort measures Outcome: Progressing   Problem: Health Behavior/Discharge Planning: Goal: Ability to manage health-related needs will improve Outcome: Progressing   Problem: Clinical Measurements: Goal: Ability to maintain clinical measurements within normal limits will improve Outcome: Progressing Goal: Will remain free from infection Outcome: Progressing Goal: Diagnostic test results will improve Outcome: Progressing Goal: Respiratory complications will improve Outcome: Progressing Goal: Cardiovascular complication will be avoided Outcome: Progressing   Problem: Activity: Goal: Risk for activity intolerance will decrease Outcome: Progressing   Problem: Nutrition: Goal: Adequate nutrition will be maintained Outcome: Progressing   Problem: Coping: Goal: Level of anxiety will decrease Outcome: Progressing   Problem: Elimination: Goal: Will not experience complications related to bowel motility Outcome: Progressing Goal: Will not experience complications related to urinary retention Outcome: Progressing   Problem: Pain Management: Goal: General experience of comfort will improve Outcome: Progressing   Problem: Safety: Goal: Ability to remain free from injury will improve Outcome: Progressing   Problem: Skin Integrity: Goal: Risk for impaired skin integrity will decrease Outcome: Progressing   Problem: Activity: Goal: Ability  to tolerate increased activity will improve Outcome: Progressing   Problem: Clinical Measurements: Goal: Ability to maintain a body temperature in the normal range will improve Outcome: Progressing   Problem: Respiratory: Goal: Ability to maintain adequate ventilation will improve Outcome: Progressing Goal: Ability to maintain a clear airway will improve Outcome: Progressing   Problem: Education: Goal: Knowledge of the prescribed therapeutic regimen will improve Outcome: Progressing Goal: Ability to verbalize activity precautions or restrictions will improve Outcome: Progressing Goal: Understanding of discharge needs will improve Outcome: Progressing   Problem: Activity: Goal: Ability to perform//tolerate increased activity and mobilize with assistive devices will improve Outcome: Progressing   Problem: Clinical Measurements: Goal: Postoperative complications will be avoided or minimized Outcome: Progressing   Problem: Self-Care: Goal: Ability to meet self-care needs will improve Outcome: Progressing   Problem: Self-Concept: Goal: Ability to maintain and perform role responsibilities to the fullest extent possible will improve Outcome: Progressing   Problem: Education: Goal: Knowledge of General Education information will improve Description: Including pain rating scale, medication(s)/side effects and non-pharmacologic comfort measures Outcome: Progressing   Problem: Health Behavior/Discharge Planning: Goal: Ability to manage health-related needs will improve Outcome: Progressing   Problem: Clinical Measurements: Goal: Ability to maintain clinical measurements within normal limits will improve Outcome: Progressing Goal: Will remain free from infection Outcome: Progressing Goal: Diagnostic test results will improve Outcome: Progressing Goal: Respiratory complications will improve Outcome: Progressing Goal: Cardiovascular complication will be avoided Outcome:  Progressing   Problem: Activity: Goal: Risk for activity intolerance will decrease Outcome: Progressing   Problem: Nutrition: Goal: Adequate nutrition will be maintained Outcome: Progressing   Problem: Coping: Goal: Level of anxiety will decrease Outcome: Progressing   Problem: Elimination: Goal: Will not experience complications related to bowel motility Outcome: Progressing Goal: Will not experience complications related to urinary retention Outcome: Progressing   Problem: Pain Management: Goal: General experience of comfort will improve Outcome: Progressing   Problem: Safety: Goal: Ability to remain free from injury will improve Outcome: Progressing   Problem: Skin Integrity: Goal: Risk for  impaired skin integrity will decrease Outcome: Progressing

## 2023-06-01 DIAGNOSIS — I10 Essential (primary) hypertension: Secondary | ICD-10-CM | POA: Diagnosis not present

## 2023-06-01 DIAGNOSIS — M86271 Subacute osteomyelitis, right ankle and foot: Secondary | ICD-10-CM | POA: Diagnosis not present

## 2023-06-01 DIAGNOSIS — E440 Moderate protein-calorie malnutrition: Secondary | ICD-10-CM | POA: Diagnosis not present

## 2023-06-01 DIAGNOSIS — D649 Anemia, unspecified: Secondary | ICD-10-CM | POA: Diagnosis not present

## 2023-06-01 NOTE — Plan of Care (Signed)
  Problem: Fluid Volume: Goal: Hemodynamic stability will improve Outcome: Progressing   Problem: Education: Goal: Knowledge of General Education information will improve Description: Including pain rating scale, medication(s)/side effects and non-pharmacologic comfort measures Outcome: Progressing   Problem: Activity: Goal: Risk for activity intolerance will decrease Outcome: Progressing   Problem: Nutrition: Goal: Adequate nutrition will be maintained Outcome: Progressing   Problem: Coping: Goal: Level of anxiety will decrease Outcome: Progressing   Problem: Safety: Goal: Ability to remain free from injury will improve Outcome: Progressing   Problem: Skin Integrity: Goal: Risk for impaired skin integrity will decrease Outcome: Progressing   Problem: Elimination: Goal: Will not experience complications related to bowel motility Outcome: Not Progressing   Problem: Pain Management: Goal: General experience of comfort will improve Outcome: Not Progressing

## 2023-06-01 NOTE — Progress Notes (Signed)
   Inpatient Rehab Admissions Coordinator :  Per Dr Caleb Popp' request, patient was screened for CIR candidacy by Ottie Glazier RN MSN.  As per my note 12/17, His BCBS is with [facility] and not participating with [facility]. He is also not at a level for the intensity of CIR rehab . He needs prolonged rehab, caregiver supports and safe discharge options which CIR can not offer. We commend to continue to pursue other rehab/discharge options. Please contact me with any questions.  Ottie Glazier RN MSN Admissions Coordinator [phone]   Dr Caleb Popp states patient now has new insurance. He is still not at a level that we would proceed with CIR admit. Maybe other rehab venues could take a second look, but likely needs prolonged rehab that we can not offer.  Ottie Glazier, RN, MSN Rehab Admissions Coordinator [phone] 06/02/2023 9:20 AM

## 2023-06-01 NOTE — TOC Progression Note (Signed)
Transition of Care (TOC) - Progression Note    Patient Details  Name: Alan Tapia. MRN: [id number] Date of Birth: 09-21-1975  Transition of Care [facility]) CM/SW Contact  Carley Hammed, LCSW Phone Number: 06/01/2023, 10:12 AM  Clinical Narrative:    DSS is closed on MLK day. Unable to follow up on DSS or Social Security application progress. CSW will attempt again tomorrow. TOC will continue to follow.    Expected Discharge Plan: Skilled Nursing Facility Barriers to Discharge: Insurance Authorization, Continued Medical Work up, SNF Pending bed offer  Expected Discharge Plan and Services In-house Referral: Clinical Social Work     Living arrangements for the past 2 months: Single Family Home Expected Discharge Date: 04/11/23                                     Social Determinants of Health (SDOH) Interventions SDOH Screenings   Food Insecurity: No Food Insecurity (04/07/2023)  Housing: Unknown (04/23/2023)  Recent Concern: Housing - Medium Risk (04/07/2023)  Transportation Needs: No Transportation Needs (04/07/2023)  Recent Concern: Transportation Needs - Unmet Transportation Needs (02/17/2023)   Received from [facility]  Utilities: At Risk (04/07/2023)  Alcohol Screen: Low Risk  (08/20/2022)  Financial Resource Strain: Patient Unable To Answer (08/20/2022)  Tobacco Use: Low Risk  (04/08/2023)    Readmission Risk Interventions    04/07/2023    2:34 PM  Readmission Risk Prevention Plan  Transportation Screening Complete  PCP or Specialist Appt within 3-5 Days Complete  HRI or Home Care Consult Complete  Social Work Consult for Recovery Care Planning/Counseling Complete  Palliative Care Screening Not Applicable  Medication Review [employer]) Complete

## 2023-06-01 NOTE — Progress Notes (Signed)
PROGRESS NOTE    Alan Tapia.  ZOX:[id number] DOB: 07-20-1975 DOA: 04/06/2023 PCP: Patient, No Pcp Per   Brief Narrative: 48 year old male with past medical history significant for left lower extremity BKA, dilated cardiomyopathy, history of combined systolic and diastolic heart failure with EF 35 to 40%, hypertension, insulin-dependent diabetes, peripheral neuropathy, hyperlipidemia who presented to the emergency department on 04/06/2023 with complaints of odor, wound of his foot. He was also having nausea, vomiting and diarrhea. Orthopedic surgery was consulted and patient underwent right BKA 11/27 by Dr. Lajoyce Corners.   Assessment and Plan:  Sepsis Present on admission, secondary to right foot osteomyelitis. Blood cultures with no growth.  Right foot osteomyelitis s/p right BKA Patient was initially treated empirically with antibiotics. Orthopedic surgery performed a right BKA on 11/27. Antibiotics discontinued.  CKD stage II Stable.  Chronic combined systolic and diastolic heart failure Stable. -Continue Toprol XL and Entresto  Primary hypertension -Continue Toprol XL and Entresto  Diarrhea Resolved.  Abdominal edema Mostly to left side of abdomen with resultant fluid filled striae and mild dermatitis. Patient declining diuresis at this time. -Change positioning in bed to offload fluid from left side of abdomen  Hemorrhoids Anusol cream  Community acquired pneumonia Patient treated with Zosyn and doxycycline. Resolved.  Acute on chronic anemia Patient with hemoglobin drop down to 5.9 g/dL secondary to right foot wound, requiring 3 units of PRBC on 11/26. Patient requiring another 1 unit of PRBC on 11/30. Stable.  COVID-19 infection Patient completed isolation protocol. Patient treated with Paxlovid.  Constipation -Continue bowel regimen  Pressure injury Bilateral perineum, unclear if present on admission.   DVT prophylaxis: Lovenox Code Status:   Code  Status: Full Code Family Communication: Wife at bedside Disposition Plan: Discharge to SNF once bed is available   Consultants:  Orthopedic surgery  Procedures:  Right below knee amputation  Antimicrobials: Paxlovid Vancomycin Zosyn Flagyl Doxycycline Cefepime   Subjective: No issues today. Feels that the swelling on his abdomen is improving with changing positions.  Objective: BP (!) 147/83 (BP Location: Right Arm)   Pulse 83   Temp 97.7 F (36.5 C) (Oral)   Resp 16   Ht 6\' 4"  (1.93 m)   Wt 132 kg   SpO2 97%   BMI 35.42 kg/m   Examination:  General exam: Appears calm and comfortable Respiratory system: Respiratory effort normal. Central nervous system: Alert and oriented. Musculoskeletal: Bilateral BKA Psychiatry: Judgement and insight appear normal. Mood & affect appropriate.    Data Reviewed: I have personally reviewed following labs and imaging studies  CBC Lab Results  Component Value Date   WBC 4.9 05/27/2023   RBC 3.69 (L) 05/27/2023   HGB 9.5 (L) 05/27/2023   HCT 31.9 (L) 05/27/2023   MCV 86.4 05/27/2023   MCH 25.7 (L) 05/27/2023   PLT 295 05/27/2023   MCHC 29.8 (L) 05/27/2023   RDW 16.1 (H) 05/27/2023   LYMPHSABS 1.9 05/10/2023   MONOABS 0.5 05/10/2023   EOSABS 0.1 05/10/2023   BASOSABS 0.1 05/10/2023     Last metabolic panel Lab Results  Component Value Date   NA 140 05/27/2023   K 4.0 05/27/2023   CL 110 05/27/2023   CO2 26 05/27/2023   BUN 8 05/27/2023   CREATININE 1.04 05/27/2023   GLUCOSE 158 (H) 05/27/2023   GFRNONAA >60 05/27/2023   GFRAA >60 09/20/2016   CALCIUM 7.2 (L) 05/27/2023   PROT 5.3 (L) 05/20/2023   ALBUMIN <1.5 (L) 05/20/2023   BILITOT 0.5  05/20/2023   ALKPHOS 182 (H) 05/20/2023   AST 12 (L) 05/20/2023   ALT 12 05/20/2023   ANIONGAP 4 (L) 05/27/2023    GFR: Estimated Creatinine Clearance: 130.3 mL/min (by C-G formula based on SCr of 1.04 mg/dL).  No results found for this or any previous visit (from  the past 240 hours).    Radiology Studies: No results found.     LOS: 55 days    Jacquelin Hawking, MD [facility] 06/01/2023, 9:05 AM   If 7PM-7AM, please contact night-coverage [email or web address]

## 2023-06-02 DIAGNOSIS — M86271 Subacute osteomyelitis, right ankle and foot: Secondary | ICD-10-CM | POA: Diagnosis not present

## 2023-06-02 DIAGNOSIS — D649 Anemia, unspecified: Secondary | ICD-10-CM | POA: Diagnosis not present

## 2023-06-02 DIAGNOSIS — I10 Essential (primary) hypertension: Secondary | ICD-10-CM | POA: Diagnosis not present

## 2023-06-02 DIAGNOSIS — E440 Moderate protein-calorie malnutrition: Secondary | ICD-10-CM | POA: Diagnosis not present

## 2023-06-02 NOTE — Progress Notes (Signed)
PROGRESS NOTE    Alan Tapia.  GNF:[id number] DOB: 20-Mar-1976 DOA: 04/06/2023 PCP: Patient, No Pcp Per   Brief Narrative: 48 year old male with past medical history significant for left lower extremity BKA, dilated cardiomyopathy, history of combined systolic and diastolic heart failure with EF 35 to 40%, hypertension, insulin-dependent diabetes, peripheral neuropathy, hyperlipidemia who presented to the emergency department on 04/06/2023 with complaints of odor, wound of his foot. He was also having nausea, vomiting and diarrhea. Orthopedic surgery was consulted and patient underwent right BKA 11/27 by Dr. Lajoyce Corners.   Assessment and Plan:  Sepsis Present on admission, secondary to right foot osteomyelitis. Blood cultures with no growth.  Right foot osteomyelitis s/p right BKA Patient was initially treated empirically with antibiotics. Orthopedic surgery performed a right BKA on 11/27. Antibiotics discontinued.  CKD stage II Stable.  Chronic combined systolic and diastolic heart failure Stable. -Continue Toprol XL and Entresto  Primary hypertension -Continue Toprol XL and Entresto  Diarrhea Resolved.  Abdominal edema Mostly to left side of abdomen with resultant fluid filled striae and mild dermatitis. Patient declining diuresis at this time. -Change positioning in bed to offload fluid from left side of abdomen  Hemorrhoids Anusol cream  Community acquired pneumonia Patient treated with Zosyn and doxycycline. Resolved.  Acute on chronic anemia Patient with hemoglobin drop down to 5.9 g/dL secondary to right foot wound, requiring 3 units of PRBC on 11/26. Patient requiring another 1 unit of PRBC on 11/30. Stable.  COVID-19 infection Patient completed isolation protocol. Patient treated with Paxlovid.  Constipation -Continue bowel regimen  Pressure injury Bilateral perineum, unclear if present on admission.   DVT prophylaxis: Lovenox Code Status:   Code  Status: Full Code Family Communication: Wife at bedside Disposition Plan: Discharge to SNF once bed is available   Consultants:  Orthopedic surgery  Procedures:  Right below knee amputation  Antimicrobials: Paxlovid Vancomycin Zosyn Flagyl Doxycycline Cefepime   Subjective: Patient wants to progress further with physical therapy. Eager to do more work. Hoping to get into a wheelchair and work on motility.  Objective: BP (!) 163/88   Pulse 86   Temp (!) 97.5 F (36.4 C) (Oral)   Resp 18   Ht 6\' 4"  (1.93 m)   Wt 133.1 kg   SpO2 98%   BMI 35.72 kg/m   Examination:  General exam: Appears calm and comfortable Respiratory system: Clear to auscultation. Respiratory effort normal. Gastrointestinal system: Abdomen is soft and nontender. Central nervous system: Alert and oriented. No focal neurological deficits. Musculoskeletal: Bilateral BKA Psychiatry: Judgement and insight appear normal. Mood & affect appropriate.    Data Reviewed: I have personally reviewed following labs and imaging studies  CBC Lab Results  Component Value Date   WBC 4.9 05/27/2023   RBC 3.69 (L) 05/27/2023   HGB 9.5 (L) 05/27/2023   HCT 31.9 (L) 05/27/2023   MCV 86.4 05/27/2023   MCH 25.7 (L) 05/27/2023   PLT 295 05/27/2023   MCHC 29.8 (L) 05/27/2023   RDW 16.1 (H) 05/27/2023   LYMPHSABS 1.9 05/10/2023   MONOABS 0.5 05/10/2023   EOSABS 0.1 05/10/2023   BASOSABS 0.1 05/10/2023     Last metabolic panel Lab Results  Component Value Date   NA 140 05/27/2023   K 4.0 05/27/2023   CL 110 05/27/2023   CO2 26 05/27/2023   BUN 8 05/27/2023   CREATININE 1.04 05/27/2023   GLUCOSE 158 (H) 05/27/2023   GFRNONAA >60 05/27/2023   GFRAA >60 09/20/2016   CALCIUM  7.2 (L) 05/27/2023   PROT 5.3 (L) 05/20/2023   ALBUMIN <1.5 (L) 05/20/2023   BILITOT 0.5 05/20/2023   ALKPHOS 182 (H) 05/20/2023   AST 12 (L) 05/20/2023   ALT 12 05/20/2023   ANIONGAP 4 (L) 05/27/2023    GFR: Estimated  Creatinine Clearance: 130.8 mL/min (by C-G formula based on SCr of 1.04 mg/dL).  No results found for this or any previous visit (from the past 240 hours).    Radiology Studies: No results found.     LOS: 56 days    Jacquelin Hawking, MD [facility] 06/02/2023, 10:33 AM   If 7PM-7AM, please contact night-coverage [email or web address]

## 2023-06-02 NOTE — Plan of Care (Signed)
  Problem: Clinical Measurements: Goal: Diagnostic test results will improve Outcome: Progressing Goal: Signs and symptoms of infection will decrease Outcome: Progressing   Problem: Respiratory: Goal: Ability to maintain adequate ventilation will improve Outcome: Progressing   Problem: Education: Goal: Knowledge of General Education information will improve Description: Including pain rating scale, medication(s)/side effects and non-pharmacologic comfort measures Outcome: Progressing   Problem: Health Behavior/Discharge Planning: Goal: Ability to manage health-related needs will improve Outcome: Progressing   Problem: Clinical Measurements: Goal: Ability to maintain clinical measurements within normal limits will improve Outcome: Progressing   Problem: Activity: Goal: Risk for activity intolerance will decrease Outcome: Progressing   Problem: Nutrition: Goal: Adequate nutrition will be maintained Outcome: Progressing

## 2023-06-02 NOTE — Plan of Care (Signed)
  Problem: Pain Management: Goal: General experience of comfort will improve Outcome: Progressing   Problem: Safety: Goal: Ability to remain free from injury will improve Outcome: Progressing

## 2023-06-02 NOTE — Progress Notes (Signed)
Physical Therapy Treatment Patient Details Name: Alan Tapia. MRN: [id number] DOB: January 07, 1976 Today's Date: 06/02/2023   History of Present Illness The pt is a 48 yo male presenting 11/25 with nausea, vomiting, and fever as well as R foot wound. 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    PT Comments  Pt eager to participate. Session today focused on transfer training and wheelchair mobility. Pt continues to require 2-3 person assist for safety for anterior posterior transfers from bed to and from wheelchair, however, does demonstrate gains in requiring less assist for bed mobility including long sitting. Pt propelled wheelchair limited hallway distances with bilateral upper extremities. Fatigues quickly. Needs continued work on strengthening residual limbs and core.   If plan is discharge home, recommend the following: Two people to help with walking and/or transfers;Assist for transportation;Assistance with cooking/housework;Help with stairs or ramp for entrance   Can travel by private vehicle     No  Equipment Recommendations  Hoyer lift;Wheelchair cushion (measurements PT);Wheelchair (measurements PT);Hospital bed    Recommendations for Other Services       Precautions / Restrictions Precautions Precautions: Fall Restrictions Weight Bearing Restrictions Per Provider Order: Yes RLE Weight Bearing Per Provider Order: Non weight bearing Other Position/Activity Restrictions: bilateral BKA (L chronic, R new)     Mobility  Bed Mobility Overal bed mobility: Needs Assistance Bed Mobility: Supine to Sit     Supine to sit: Contact guard     General bed mobility comments: Progressing from supine to long sitting with HOB elevated and CGA; use of rails to maneuver. Increased time/effort    Transfers Overall  transfer level: Needs assistance Equipment used: None           Anterior-Posterior transfers: Max assist (+3 safety)   General transfer comment: Maxislide from bed to wheelchair with pt pants donned. Cues for hand placement and reciprocal scooting to and from wheelchair. +3 for safety. Assist to unweight bilateral residual limbs    Ambulation/Gait                   Psychologist, counselling mobility: Yes Wheelchair propulsion: Both upper extremities Wheelchair parts: Supervision/cueing Distance: 100 Wheelchair Assistance Details (indicate cue type and reason): Several rest breaks, practiced level surface and inclined descent   Tilt Bed    Modified Rankin (Stroke Patients Only)       Balance Overall balance assessment: Needs assistance Sitting-balance support: Feet unsupported, No upper extremity supported Sitting balance-Leahy Scale: Fair                                      Cognition Arousal: Alert Behavior During Therapy: WFL for tasks assessed/performed Overall Cognitive Status: Within Functional Limits for tasks assessed                                          Exercises      General Comments        Pertinent Vitals/Pain Pain Assessment Pain Assessment: Faces Faces Pain Scale: Hurts a little bit Pain Location: buttocks, perinum  with scooting Pain Descriptors / Indicators: Discomfort Pain Intervention(s): Monitored during session    Home Living                          Prior Function            PT Goals (current goals can now be found in the care plan section) Acute Rehab PT Goals Potential to Achieve Goals: Fair Progress towards PT goals: Progressing toward goals    Frequency    Min 1X/week      PT Plan      Co-evaluation              AM-PAC PT "6 Clicks" Mobility   Outcome Measure  Help needed turning from your back to  your side while in a flat bed without using bedrails?: A Lot Help needed moving from lying on your back to sitting on the side of a flat bed without using bedrails?: A Lot Help needed moving to and from a bed to a chair (including a wheelchair)?: Total Help needed standing up from a chair using your arms (e.g., wheelchair or bedside chair)?: Total Help needed to walk in hospital room?: Total Help needed climbing 3-5 steps with a railing? : Total 6 Click Score: 8    End of Session   Activity Tolerance: Patient tolerated treatment well Patient left: in bed;with call bell/phone within reach Nurse Communication: Mobility status PT Visit Diagnosis: Other abnormalities of gait and mobility (R26.89);Muscle weakness (generalized) (M62.81);Pain Pain - Right/Left: Right Pain - part of body: Leg     Time: 1610-9604 PT Time Calculation (min) (ACUTE ONLY): 52 min  Charges:    $Therapeutic Activity: 38-52 mins PT General Charges $$ ACUTE PT VISIT: 1 Visit                     Lillia Pauls, PT, DPT Acute Rehabilitation Services Office [phone]    Norval Morton 06/02/2023, 4:08 PM

## 2023-06-03 DIAGNOSIS — N179 Acute kidney failure, unspecified: Secondary | ICD-10-CM | POA: Diagnosis not present

## 2023-06-03 DIAGNOSIS — R6520 Severe sepsis without septic shock: Secondary | ICD-10-CM | POA: Diagnosis not present

## 2023-06-03 DIAGNOSIS — A419 Sepsis, unspecified organism: Secondary | ICD-10-CM | POA: Diagnosis not present

## 2023-06-03 LAB — CBC
HCT: 33.7 % — ABNORMAL LOW (ref 39.0–52.0)
Hemoglobin: 10.1 g/dL — ABNORMAL LOW (ref 13.0–17.0)
MCH: 25.8 pg — ABNORMAL LOW (ref 26.0–34.0)
MCHC: 30 g/dL (ref 30.0–36.0)
MCV: 86 fL (ref 80.0–100.0)
Platelets: 296 10*3/uL (ref 150–400)
RBC: 3.92 MIL/uL — ABNORMAL LOW (ref 4.22–5.81)
RDW: 15.1 % (ref 11.5–15.5)
WBC: 4.7 10*3/uL (ref 4.0–10.5)
nRBC: 0 % (ref 0.0–0.2)

## 2023-06-03 LAB — BASIC METABOLIC PANEL
Anion gap: 5 (ref 5–15)
BUN: 7 mg/dL (ref 6–20)
CO2: 23 mmol/L (ref 22–32)
Calcium: 7.2 mg/dL — ABNORMAL LOW (ref 8.9–10.3)
Chloride: 106 mmol/L (ref 98–111)
Creatinine, Ser: 0.96 mg/dL (ref 0.61–1.24)
GFR, Estimated: >60 mL/min (ref >60)
Glucose, Bld: 187 mg/dL — ABNORMAL HIGH (ref 70–99)
Potassium: 4.2 mmol/L (ref 3.5–5.1)
Sodium: 134 mmol/L — ABNORMAL LOW (ref 135–145)

## 2023-06-03 MED ORDER — LIDOCAINE 2% (20 MG/ML) 5 ML SYRINGE
INTRAMUSCULAR | Status: AC
  Filled 2023-06-03: qty 5

## 2023-06-03 MED ORDER — ENOXAPARIN SODIUM 80 MG/0.8ML IJ SOSY
65.0000 mg | PREFILLED_SYRINGE | Freq: Every day | INTRAMUSCULAR | Status: DC
  Administered 2023-06-04 – 2023-06-09 (×6): 65 mg via SUBCUTANEOUS
  Filled 2023-06-03 (×5): qty 0.8

## 2023-06-03 MED ORDER — PROPOFOL 1000 MG/100ML IV EMUL
INTRAVENOUS | Status: AC
  Filled 2023-06-03: qty 100

## 2023-06-03 NOTE — Plan of Care (Signed)
  Problem: Respiratory: Goal: Ability to maintain adequate ventilation will improve Outcome: Progressing   Problem: Education: Goal: Knowledge of General Education information will improve Description: Including pain rating scale, medication(s)/side effects and non-pharmacologic comfort measures Outcome: Progressing   Problem: Clinical Measurements: Goal: Ability to maintain clinical measurements within normal limits will improve Outcome: Progressing   Problem: Activity: Goal: Risk for activity intolerance will decrease Outcome: Progressing   Problem: Nutrition: Goal: Adequate nutrition will be maintained Outcome: Progressing   Problem: Coping: Goal: Level of anxiety will decrease Outcome: Progressing   Problem: Pain Management: Goal: General experience of comfort will improve Outcome: Progressing   Problem: Safety: Goal: Ability to remain free from injury will improve Outcome: Progressing

## 2023-06-03 NOTE — Plan of Care (Signed)
  Problem: Fluid Volume: Goal: Hemodynamic stability will improve Outcome: Progressing   Problem: Clinical Measurements: Goal: Diagnostic test results will improve Outcome: Progressing   Problem: Respiratory: Goal: Ability to maintain adequate ventilation will improve Outcome: Progressing   Problem: Education: Goal: Knowledge of General Education information will improve Description: Including pain rating scale, medication(s)/side effects and non-pharmacologic comfort measures Outcome: Progressing   Problem: Clinical Measurements: Goal: Ability to maintain clinical measurements within normal limits will improve Outcome: Progressing   Problem: Nutrition: Goal: Adequate nutrition will be maintained Outcome: Progressing   Problem: Elimination: Goal: Will not experience complications related to bowel motility Outcome: Progressing Goal: Will not experience complications related to urinary retention Outcome: Progressing   Problem: Safety: Goal: Ability to remain free from injury will improve Outcome: Progressing

## 2023-06-03 NOTE — Progress Notes (Signed)
Occupational Therapy Treatment Patient Details Name: Alan Tapia. MRN: [id number] DOB: 1975-06-18 Today's Date: 06/03/2023   History of present illness The pt is a 48 yo male presenting 11/25 with nausea, vomiting, and fever as well as R foot wound. 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   OT comments  Pt. Seen for skilled OT treatment.  Focused on B UE, and trunk strengthening simulating movements needed during bed mobility and ant/post transfers.  Pt. Able to use B lower bed rails for UE support to pull up into long sitting with HOB elevated.  Then controlled lay back and as modified rows/sit ups.  Added leaning L/R to promote weight shifting and education for initiation of moving LEs.  Pt. Requesting higher level of theraband.  Provided new level 4 theraband.  Pt. Able to complete BUE excercises with use of theraband with good tech.  Pt. In good spirits and willing to participate in all exercises today.  Cont. With acute OT POC.        If plan is discharge home, recommend the following:  Two people to help with walking and/or transfers;Two people to help with bathing/dressing/bathroom;Assistance with cooking/housework;Assist for transportation;Help with stairs or ramp for entrance   Equipment Recommendations       Recommendations for Other Services      Precautions / Restrictions Precautions Precautions: Fall Precaution Comments: RLE limb protector for OOB mobility Restrictions RLE Weight Bearing Per Provider Order: Non weight bearing Other Position/Activity Restrictions: bilateral BKA (L chronic, R new)       Mobility Bed Mobility Overal bed mobility: Needs Assistance Bed Mobility: Supine to Sit, Sit to Supine     Supine to sit: Contact guard Sit to supine: Supervision   General bed mobility comments:  Progressing from supine to long sitting with HOB elevated and CGA; use of rails to maneuver. Increased time/effort    Transfers                         Balance                                           ADL either performed or assessed with clinical judgement   ADL                                              Extremity/Trunk Assessment              Vision       Perception     Praxis      Cognition Arousal: Alert Behavior During Therapy: WFL for tasks assessed/performed Overall Cognitive Status: Within Functional Limits for tasks assessed                                 General Comments: pt. in good spirits motivated and eager/willing to complete all indicated exercises        Exercises General Exercises - Upper Extremity Shoulder Flexion: Strengthening, Both, 10 reps, Seated, Theraband Theraband Level (Shoulder Flexion):  Level 4 (Blue) Shoulder Horizontal ABduction: Strengthening, Both, 10 reps, Seated, Theraband Theraband Level (Shoulder Horizontal Abduction): Level 4 (Blue) Elbow Flexion: 20 reps, Strengthening, Both, Theraband Elbow Extension: Strengthening, Both, 20 reps, Theraband Theraband Level (Elbow Extension): Level 3 (Green) Other Exercises Other Exercises: pulling on lower bedrails to long sitting Other Exercises: long sitting with B lower bedrails S/CGA, 5 Reps x2 Other Exercises: leaning L/R in prep for increasing ability to weight shift as steps for increasing abilities to help with ant/post. transfer    Shoulder Instructions       General Comments      Pertinent Vitals/ Pain       Pain Assessment Pain Assessment: No/denies pain  Home Living                                          Prior Functioning/Environment              Frequency  Min 1X/week        Progress Toward Goals  OT Goals(current goals can now be found in the care plan section)   Progress towards OT goals: Progressing toward goals     Plan      Co-evaluation                 AM-PAC OT "6 Clicks" Daily Activity     Outcome Measure   Help from another person eating meals?: A Little Help from another person taking care of personal grooming?: A Little Help from another person toileting, which includes using toliet, bedpan, or urinal?: A Lot Help from another person bathing (including washing, rinsing, drying)?: A Lot Help from another person to put on and taking off regular upper body clothing?: A Little Help from another person to put on and taking off regular lower body clothing?: A Lot 6 Click Score: 15    End of Session    OT Visit Diagnosis: Other abnormalities of gait and mobility (R26.89);Muscle weakness (generalized) (M62.81);Other (comment)   Activity Tolerance Patient tolerated treatment well   Patient Left in bed;with call bell/phone within reach   Nurse Communication          Time: 1231-1300 OT Time Calculation (min): 29 min  Charges: OT General Charges $OT Visit: 1 Visit OT Treatments $Therapeutic Exercise: 23-37 mins  [name], COTA/L Acute Rehabilitation [phone]   [name]-COTA/L 06/03/2023, 1:14 PM

## 2023-06-03 NOTE — Progress Notes (Signed)
PROGRESS NOTE    Alan Tapia.  ZOX:[id number] DOB: Dec 03, 1975 DOA: 04/06/2023 PCP: Patient, No Pcp Per     Brief Narrative:  Alan Tapia. Is a 48 year old male with past medical history significant for left lower extremity BKA, dilated cardiomyopathy, history of combined systolic and diastolic heart failure with EF 35 to 40%, hypertension, insulin-dependent diabetes, peripheral neuropathy, hyperlipidemia who presented to the emergency department on 04/06/2023 with complaints of odor, wound of his foot. He was also having nausea, vomiting and diarrhea. Orthopedic surgery was consulted and patient underwent right BKA 11/27 by Dr. Lajoyce Corners.  Hospitalization prolonged due to difficult to place status.  New events last 24 hours / Subjective: No new issues  Assessment & Plan:   Principal Problem:   Sepsis (HCC) Active Problems:   Essential hypertension   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   CKD (chronic kidney disease), stage II   S/P BKA (below knee amputation), right (HCC) - 04-08-2023   Acute on chronic anemia   Chronic pain   Acute osteomyelitis of right foot (HCC)   Insulin dependent type 2 diabetes mellitus (HCC)   Hx of left BKA (HCC)   PVD (peripheral vascular disease) (HCC)   Subacute osteomyelitis of right foot (HCC)   Cutaneous abscess of right foot   Malnutrition of moderate degree   COVID-19 virus infection   Pressure injury of skin   Obesity, Class II, BMI 35-39.9    Sepsis Present on admission, secondary to right foot osteomyelitis. Blood cultures with no growth.   Right foot osteomyelitis s/p right BKA Patient was initially treated empirically with antibiotics. Orthopedic surgery performed a right BKA on 11/27. Antibiotics discontinued.   CKD stage II Stable.   Chronic combined systolic and diastolic heart failure Stable. -Continue Toprol XL and Entresto   Primary hypertension -Continue Toprol XL  and Entresto   Diarrhea Resolved.   Abdominal edema Mostly to left side of abdomen with resultant fluid filled striae and mild dermatitis. Patient declining diuresis at this time. -Change positioning in bed to offload fluid from left side of abdomen   Hemorrhoids Anusol cream   Community acquired pneumonia Patient treated with Zosyn and doxycycline. Resolved.   Acute on chronic anemia Patient with hemoglobin drop down to 5.9 g/dL secondary to right foot wound, requiring 3 units of PRBC on 11/26. Patient requiring another 1 unit of PRBC on 11/30. Stable.   COVID-19 infection Patient completed isolation protocol. Patient treated with Paxlovid.   Constipation -Continue bowel regimen    In agreement with assessment of the pressure ulcer as below:  Pressure Injury 04/13/23 Perineum Bilateral Stage 2 -  Partial thickness loss of dermis presenting as a shallow open injury with a red, pink wound bed without slough. perineum/scrotum masd on admission (Active)  04/13/23 1100  Location: Perineum  Location Orientation: Bilateral  Staging: Stage 2 -  Partial thickness loss of dermis presenting as a shallow open injury with a red, pink wound bed without slough.  Wound Description (Comments): perineum/scrotum masd on admission  Present on Admission:   Dressing Type None 06/02/23 1933     Nutrition Problem: Moderate Malnutrition Etiology: chronic illness   DVT prophylaxis: Lovenox  Code Status: Full code Family Communication: At bedside Disposition Plan: SNF Status is: Inpatient Remains inpatient appropriate because: SNF placement pending    Antimicrobials:  Anti-infectives (From admission, onward)    Start     Dose/Rate Route Frequency Ordered Stop  04/30/23 1800  nirmatrelvir/ritonavir (PAXLOVID) 3 tablet        3 tablet Oral 2 times daily 04/30/23 1621 05/05/23 0820   04/09/23 1345  doxycycline (VIBRAMYCIN) 100 mg in dextrose 5 % 250 mL IVPB        100 mg 125 mL/hr over  120 Minutes Intravenous Every 12 hours 04/09/23 1252 04/09/23 1746   04/08/23 1300  levofloxacin (LEVAQUIN) IVPB 500 mg  Status:  Discontinued        500 mg 100 mL/hr over 60 Minutes Intravenous On call to O.R. 04/08/23 1229 04/08/23 1648   04/08/23 1300  vancomycin (VANCOREADY) IVPB 1500 mg/300 mL        1,500 mg 100 mL/hr over 180 Minutes Intravenous On call to O.R. 04/08/23 1229 04/08/23 1433   04/08/23 1237  vancomycin HCl (VANCOREADY) 1500 MG/300ML IVPB       Note to Pharmacy: [name] D: cabinet override      04/08/23 1237 04/08/23 1439   04/07/23 0800  piperacillin-tazobactam (ZOSYN) IVPB 3.375 g        3.375 g 12.5 mL/hr over 240 Minutes Intravenous Every 8 hours 04/07/23 0108 04/09/23 2108   04/07/23 0100  doxycycline (VIBRAMYCIN) 100 mg in dextrose 5 % 250 mL IVPB  Status:  Discontinued        100 mg 125 mL/hr over 120 Minutes Intravenous Every 12 hours 04/07/23 0057 04/09/23 1252   04/07/23 0000  ceFEPIme (MAXIPIME) 2 g in sodium chloride 0.9 % 100 mL IVPB  Status:  Discontinued        2 g 200 mL/hr over 30 Minutes Intravenous Every 8 hours 04/06/23 2349 04/07/23 0108   04/06/23 2330  metroNIDAZOLE (FLAGYL) IVPB 500 mg        500 mg 100 mL/hr over 60 Minutes Intravenous  Once 04/06/23 2321 04/07/23 0032   04/06/23 2300  piperacillin-tazobactam (ZOSYN) IVPB 3.375 g  Status:  Discontinued        3.375 g 100 mL/hr over 30 Minutes Intravenous  Once 04/06/23 2257 04/06/23 2320   04/06/23 2300  vancomycin (VANCOREADY) IVPB 2000 mg/400 mL  Status:  Discontinued        2,000 mg 200 mL/hr over 120 Minutes Intravenous  Once 04/06/23 2257 04/06/23 2312        Objective: Vitals:   06/03/23 0458 06/03/23 0500 06/03/23 0742 06/03/23 0858  BP: 129/69  138/78 135/76  Pulse: 71  75 79  Resp: 18  16   Temp: 98.3 F (36.8 C)  98.3 F (36.8 C)   TempSrc: Oral  Oral   SpO2: 100%  100%   Weight:  129.8 kg    Height:        Intake/Output Summary (Last 24 hours) at 06/03/2023  1509 Last data filed at 06/02/2023 2330 Gross per 24 hour  Intake 480 ml  Output 950 ml  Net -470 ml   Filed Weights   06/01/23 0445 06/02/23 0430 06/03/23 0500  Weight: 132 kg 133.1 kg 129.8 kg    Examination:  General exam: Appears calm and comfortable    Data Reviewed: I have personally reviewed following labs and imaging studies  CBC: Recent Labs  Lab 06/03/23 0626  WBC 4.7  HGB 10.1*  HCT 33.7*  MCV 86.0  PLT 296   Basic Metabolic Panel: Recent Labs  Lab 06/03/23 0626  NA 134*  K 4.2  CL 106  CO2 23  GLUCOSE 187*  BUN 7  CREATININE 0.96  CALCIUM 7.2*  GFR: Estimated Creatinine Clearance: 139.9 mL/min (by C-G formula based on SCr of 0.96 mg/dL). Liver Function Tests: No results for input(s): "AST", "ALT", "ALKPHOS", "BILITOT", "PROT", "ALBUMIN" in the last 168 hours. No results for input(s): "LIPASE", "AMYLASE" in the last 168 hours. No results for input(s): "AMMONIA" in the last 168 hours. Coagulation Profile: No results for input(s): "INR", "PROTIME" in the last 168 hours. Cardiac Enzymes: No results for input(s): "CKTOTAL", "CKMB", "CKMBINDEX", "TROPONINI" in the last 168 hours. BNP (last 3 results) No results for input(s): "PROBNP" in the last 8760 hours. HbA1C: No results for input(s): "HGBA1C" in the last 72 hours. CBG: No results for input(s): "GLUCAP" in the last 168 hours. Lipid Profile: No results for input(s): "CHOL", "HDL", "LDLCALC", "TRIG", "CHOLHDL", "LDLDIRECT" in the last 72 hours. Thyroid Function Tests: No results for input(s): "TSH", "T4TOTAL", "FREET4", "T3FREE", "THYROIDAB" in the last 72 hours. Anemia Panel: No results for input(s): "VITAMINB12", "FOLATE", "FERRITIN", "TIBC", "IRON", "RETICCTPCT" in the last 72 hours. Sepsis Labs: No results for input(s): "PROCALCITON", "LATICACIDVEN" in the last 168 hours.  No results found for this or any previous visit (from the past 240 hours).    Radiology Studies: No results  found.    Scheduled Meds:  docusate sodium  100 mg Oral BID   [START ON 06/04/2023] enoxaparin (LOVENOX) injection  65 mg Subcutaneous Daily   feeding supplement  237 mL Oral BID BM   Gerhardt's butt cream   Topical BID   hydrocortisone   Rectal BID   lidocaine  2 patch Transdermal Q24H   melatonin  5 mg Oral QHS   metoprolol succinate  200 mg Oral Daily   pantoprazole  40 mg Oral BID   polyethylene glycol  17 g Oral BID   saccharomyces boulardii  250 mg Oral BID   sacubitril-valsartan  1 tablet Oral BID   Continuous Infusions:   LOS: 57 days   Time spent: 20 minutes   Noralee Stain, DO [facility] 06/03/2023, 3:09 PM   Available via Epic secure chat 7am-7pm After these hours, please refer to coverage provider listed on [email or web address]

## 2023-06-03 NOTE — TOC Progression Note (Signed)
Transition of Care (TOC) - Progression Note    Patient Details  Name: Alan Tapia. MRN: [id number] Date of Birth: 03-03-1976  Transition of Care [facility]) CM/SW Contact  Carley Hammed, LCSW Phone Number: 06/03/2023, 1:34 PM  Clinical Narrative:     CSW spoke with Hale Bogus at DSS who noted she still has not been able to speak with SS Case worker. CSW called Jill Side with [facility], one of the facilities that accepts Medicaid with the disability policy. Jill Side states she will have her Loss adjuster, chartered speak with her contact at [facility] and follow up with any information she was able to find. Will also review pt for possible admission for when SS is approved. TOC will continue to follow.   Expected Discharge Plan: Skilled Nursing Facility Barriers to Discharge: Insurance Authorization, Continued Medical Work up, SNF Pending bed offer  Expected Discharge Plan and Services In-house Referral: Clinical Social Work     Living arrangements for the past 2 months: Single Family Home Expected Discharge Date: 04/11/23                                     Social Determinants of Health (SDOH) Interventions SDOH Screenings   Food Insecurity: No Food Insecurity (04/07/2023)  Housing: Unknown (04/23/2023)  Recent Concern: Housing - Medium Risk (04/07/2023)  Transportation Needs: No Transportation Needs (04/07/2023)  Recent Concern: Transportation Needs - Unmet Transportation Needs (02/17/2023)   Received from [facility]  Utilities: At Risk (04/07/2023)  Alcohol Screen: Low Risk  (08/20/2022)  Financial Resource Strain: Patient Unable To Answer (08/20/2022)  Tobacco Use: Low Risk  (04/08/2023)    Readmission Risk Interventions    04/07/2023    2:34 PM  Readmission Risk Prevention Plan  Transportation Screening Complete  PCP or Specialist Appt within 3-5 Days Complete  HRI or Home Care Consult Complete  Social Work Consult for Recovery Care Planning/Counseling Complete  Palliative  Care Screening Not Applicable  Medication Review Oceanographer) Complete

## 2023-06-04 DIAGNOSIS — A419 Sepsis, unspecified organism: Secondary | ICD-10-CM | POA: Diagnosis not present

## 2023-06-04 DIAGNOSIS — R6520 Severe sepsis without septic shock: Secondary | ICD-10-CM | POA: Diagnosis not present

## 2023-06-04 DIAGNOSIS — N179 Acute kidney failure, unspecified: Secondary | ICD-10-CM | POA: Diagnosis not present

## 2023-06-04 NOTE — Plan of Care (Signed)
 Problem: Fluid Volume: Goal: Hemodynamic stability will improve Outcome: Progressing   Problem: Clinical Measurements: Goal: Diagnostic test results will improve Outcome: Progressing Goal: Signs and symptoms of infection will decrease Outcome: Progressing   Problem: Respiratory: Goal: Ability to maintain adequate ventilation will improve Outcome: Progressing   Problem: Education: Goal: Knowledge of General Education information will improve Description: Including pain rating scale, medication(s)/side effects and non-pharmacologic comfort measures Outcome: Progressing   Problem: Health Behavior/Discharge Planning: Goal: Ability to manage health-related needs will improve Outcome: Progressing   Problem: Clinical Measurements: Goal: Ability to maintain clinical measurements within normal limits will improve Outcome: Progressing Goal: Will remain free from infection Outcome: Progressing Goal: Diagnostic test results will improve Outcome: Progressing Goal: Respiratory complications will improve Outcome: Progressing Goal: Cardiovascular complication will be avoided Outcome: Progressing   Problem: Activity: Goal: Risk for activity intolerance will decrease Outcome: Progressing   Problem: Nutrition: Goal: Adequate nutrition will be maintained Outcome: Progressing   Problem: Coping: Goal: Level of anxiety will decrease Outcome: Progressing   Problem: Elimination: Goal: Will not experience complications related to bowel motility Outcome: Progressing Goal: Will not experience complications related to urinary retention Outcome: Progressing   Problem: Pain Management: Goal: General experience of comfort will improve Outcome: Progressing   Problem: Safety: Goal: Ability to remain free from injury will improve Outcome: Progressing   Problem: Skin Integrity: Goal: Risk for impaired skin integrity will decrease Outcome: Progressing   Problem: Activity: Goal: Ability  to tolerate increased activity will improve Outcome: Progressing   Problem: Clinical Measurements: Goal: Ability to maintain a body temperature in the normal range will improve Outcome: Progressing   Problem: Respiratory: Goal: Ability to maintain adequate ventilation will improve Outcome: Progressing Goal: Ability to maintain a clear airway will improve Outcome: Progressing   Problem: Education: Goal: Knowledge of the prescribed therapeutic regimen will improve Outcome: Progressing Goal: Ability to verbalize activity precautions or restrictions will improve Outcome: Progressing Goal: Understanding of discharge needs will improve Outcome: Progressing   Problem: Activity: Goal: Ability to perform//tolerate increased activity and mobilize with assistive devices will improve Outcome: Progressing   Problem: Clinical Measurements: Goal: Postoperative complications will be avoided or minimized Outcome: Progressing   Problem: Self-Care: Goal: Ability to meet self-care needs will improve Outcome: Progressing   Problem: Self-Concept: Goal: Ability to maintain and perform role responsibilities to the fullest extent possible will improve Outcome: Progressing   Problem: Education: Goal: Knowledge of General Education information will improve Description: Including pain rating scale, medication(s)/side effects and non-pharmacologic comfort measures Outcome: Progressing   Problem: Health Behavior/Discharge Planning: Goal: Ability to manage health-related needs will improve Outcome: Progressing   Problem: Clinical Measurements: Goal: Ability to maintain clinical measurements within normal limits will improve Outcome: Progressing Goal: Will remain free from infection Outcome: Progressing Goal: Diagnostic test results will improve Outcome: Progressing Goal: Respiratory complications will improve Outcome: Progressing Goal: Cardiovascular complication will be avoided Outcome:  Progressing   Problem: Activity: Goal: Risk for activity intolerance will decrease Outcome: Progressing   Problem: Nutrition: Goal: Adequate nutrition will be maintained Outcome: Progressing   Problem: Coping: Goal: Level of anxiety will decrease Outcome: Progressing   Problem: Elimination: Goal: Will not experience complications related to bowel motility Outcome: Progressing Goal: Will not experience complications related to urinary retention Outcome: Progressing   Problem: Pain Management: Goal: General experience of comfort will improve Outcome: Progressing   Problem: Safety: Goal: Ability to remain free from injury will improve Outcome: Progressing   Problem: Skin Integrity: Goal: Risk for  impaired skin integrity will decrease Outcome: Progressing

## 2023-06-04 NOTE — Progress Notes (Signed)
Physical Therapy Treatment Patient Details Name: Alan Tapia. MRN: [id number] DOB: 1976-04-04 Today's Date: 06/04/2023   History of Present Illness The pt is a 48 yo male presenting 11/25 with nausea, vomiting, and fever as well as R foot wound. 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    PT Comments  Patient was willing to participate with therapy today. The focus of today's session was to improve UE strength in order to improve propulsion for w/c mobility. Additionally, other therapeutic exercises focused on core activation and hip strengthening in supine and sidelying.  The patient was able to initiate a roll to both sides, however he required Mod A +2 to completely roll on both sides. The patient was able to use UE support to pull himself to [facility] using handrails on both sides of the bed, with additional assistance +2. Continue with acute PT plan of care.    If plan is discharge home, recommend the following: Two people to help with walking and/or transfers;Assist for transportation;Assistance with cooking/housework;Help with stairs or ramp for entrance   Can travel by private vehicle     No  Equipment Recommendations  Hoyer lift;Wheelchair cushion (measurements PT);Wheelchair (measurements PT);Hospital bed    Recommendations for Other Services       Precautions / Restrictions Precautions Precautions: Fall Restrictions Weight Bearing Restrictions Per Provider Order: Yes RLE Weight Bearing Per Provider Order: Non weight bearing Other Position/Activity Restrictions: bilateral BKA (L chronic, R new)     Mobility  Bed Mobility Overal bed mobility: Needs Assistance Bed Mobility: Rolling, Sit to Supine Rolling: Mod assist, +2 for physical assistance     Sit to supine: Supervision   General bed mobility comments:  Patient was able to intiate roll, needed additional assistance to lay completely on his side. Patient used bed railings to assist with [facility] transfer    Transfers                        Ambulation/Gait                   Stairs             Wheelchair Mobility     Tilt Bed    Modified Rankin (Stroke Patients Only)       Balance Overall balance assessment: Needs assistance Sitting-balance support: Single extremity supported, Feet unsupported Sitting balance-Leahy Scale: Fair Sitting balance - Comments: Prior to treatment, pt was seen EOB. Good trunk control with LUE supported on the side of the bed rail                                    Cognition Arousal: Alert Behavior During Therapy: WFL for tasks assessed/performed Overall Cognitive Status: Within Functional Limits for tasks assessed                                 General Comments: pt. in good spirits motivated and eager/willing to complete all indicated exercises        Exercises General Exercises - Upper Extremity Theraband Level (Shoulder Flexion): Level 3 (Green) (3x10) Theraband Level (Elbow Flexion): Level 3 (Green) (3x10) Theraband Level (Elbow Extension):  Level 3 (Green) (3x10) Amputee Exercises Hip ABduction/ADduction: AROM, Strengthening, Sidelying, 20 reps Other Exercises Other Exercises: Rows 3x10 with GTB while sitting EOB Other Exercises: Bridges 2x10 Other Exercises: Supine Core Crunches 3x10    General Comments        Pertinent Vitals/Pain Pain Assessment Pain Assessment: Faces Faces Pain Scale: Hurts a little bit Pain Location: buttocks, perinum with scooting Pain Descriptors / Indicators: Discomfort Pain Intervention(s): Monitored during session    Home Living                          Prior Function            PT Goals (current goals can now be found in the care plan section) Acute Rehab PT Goals PT Goal Formulation:  With patient Time For Goal Achievement: 06/09/23 Potential to Achieve Goals: Fair Progress towards PT goals: Progressing toward goals    Frequency    Min 1X/week      PT Plan      Co-evaluation              AM-PAC PT "6 Clicks" Mobility   Outcome Measure  Help needed turning from your back to your side while in a flat bed without using bedrails?: A Lot Help needed moving from lying on your back to sitting on the side of a flat bed without using bedrails?: A Lot Help needed moving to and from a bed to a chair (including a wheelchair)?: Total Help needed standing up from a chair using your arms (e.g., wheelchair or bedside chair)?: Total Help needed to walk in hospital room?: Total Help needed climbing 3-5 steps with a railing? : Total 6 Click Score: 8    End of Session   Activity Tolerance: Patient tolerated treatment well Patient left: in bed;with call bell/phone within reach;with family/visitor present   PT Visit Diagnosis: Other abnormalities of gait and mobility (R26.89);Muscle weakness (generalized) (M62.81);Pain Pain - Right/Left: Right Pain - part of body: Leg     Time: 4696-2952 PT Time Calculation (min) (ACUTE ONLY): 36 min  Charges:    $Therapeutic Exercise: 23-37 mins PT General Charges $$ ACUTE PT VISIT: 1 Visit                     Alan Tapia, SPT   Alan Tapia 06/04/2023, 3:50 PM

## 2023-06-04 NOTE — Progress Notes (Signed)
PROGRESS NOTE    Alan Tapia.  ZOX:[id number] DOB: [birth year] DOA: 04/06/2023 PCP: Patient, No Pcp Per     Brief Narrative:  Alan Tapia. Is a 48 year old male with past medical history significant for left lower extremity BKA, dilated cardiomyopathy, history of combined systolic and diastolic heart failure with EF 35 to 40%, hypertension, insulin-dependent diabetes, peripheral neuropathy, hyperlipidemia who presented to the emergency department on 04/06/2023 with complaints of odor, wound of his foot. He was also having nausea, vomiting and diarrhea. Orthopedic surgery was consulted and patient underwent right BKA 11/27 by Dr. Lajoyce Corners.  Hospitalization prolonged due to difficult to place status.  New events last 24 hours / Subjective: Being repositioned in bed by staff.  No acute issues reported overnight.  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 Blood cultures with no growth.   Right foot osteomyelitis s/p right BKA Patient was initially treated empirically with antibiotics. Orthopedic surgery performed a right BKA on 11/27. Antibiotics discontinued.   CKD stage II Stable.   Chronic combined systolic and diastolic heart failure Stable. -Continue Toprol XL  and Entresto   Primary hypertension -Continue Toprol XL and Entresto   Diarrhea Resolved.   Abdominal edema Mostly to left side of abdomen with resultant fluid filled striae and mild dermatitis. Patient declining diuresis at this time. -Change positioning in bed to offload fluid from left side of abdomen   Hemorrhoids Anusol cream   Community acquired pneumonia Patient treated with Zosyn and doxycycline. Resolved.   Acute on chronic anemia Patient with hemoglobin drop down to 5.9 g/dL secondary to right foot wound, requiring 3 units of PRBC on 11/26. Patient requiring another 1 unit of PRBC on 11/30. Stable.   COVID-19 infection Patient completed isolation protocol. Patient treated with Paxlovid.   Constipation -Continue bowel regimen    In agreement with assessment of the pressure ulcer as below:  Pressure Injury 04/13/23 Perineum Bilateral Stage 2 -  Partial thickness loss of dermis presenting as a shallow open injury with a red, pink wound bed without slough. perineum/scrotum masd on admission (Active)  04/13/23 1100  Location: Perineum  Location Orientation: Bilateral  Staging: Stage 2 -  Partial thickness loss of dermis presenting as a shallow open injury with a red, pink wound bed without slough.  Wound Description (Comments): perineum/scrotum masd on admission  Present on Admission:   Dressing Type None 06/04/23 0845     Nutrition Problem: Moderate Malnutrition Etiology: chronic illness   DVT prophylaxis: Lovenox  Code Status: Full code Family Communication: At bedside Disposition Plan: SNF Status is: Inpatient Remains inpatient appropriate because: SNF placement pending    Antimicrobials:  Anti-infectives (From admission, onward)    Start  Dose/Rate Route Frequency Ordered Stop   04/30/23 1800  nirmatrelvir/ritonavir (PAXLOVID) 3 tablet        3 tablet Oral 2 times daily 04/30/23 1621 05/05/23 0820   04/09/23 1345  doxycycline (VIBRAMYCIN) 100 mg  in dextrose 5 % 250 mL IVPB        100 mg 125 mL/hr over 120 Minutes Intravenous Every 12 hours 04/09/23 1252 04/09/23 1746   04/08/23 1300  levofloxacin (LEVAQUIN) IVPB 500 mg  Status:  Discontinued        500 mg 100 mL/hr over 60 Minutes Intravenous On call to O.R. 04/08/23 1229 04/08/23 1648   04/08/23 1300  vancomycin (VANCOREADY) IVPB 1500 mg/300 mL        1,500 mg 100 mL/hr over 180 Minutes Intravenous On call to O.R. 04/08/23 1229 04/08/23 1433   04/08/23 1237  vancomycin HCl (VANCOREADY) 1500 MG/300ML IVPB       Note to Pharmacy: [name] D: cabinet override      04/08/23 1237 04/08/23 1439   04/07/23 0800  piperacillin-tazobactam (ZOSYN) IVPB 3.375 g        3.375 g 12.5 mL/hr over 240 Minutes Intravenous Every 8 hours 04/07/23 0108 04/09/23 2108   04/07/23 0100  doxycycline (VIBRAMYCIN) 100 mg in dextrose 5 % 250 mL IVPB  Status:  Discontinued        100 mg 125 mL/hr over 120 Minutes Intravenous Every 12 hours 04/07/23 0057 04/09/23 1252   04/07/23 0000  ceFEPIme (MAXIPIME) 2 g in sodium chloride 0.9 % 100 mL IVPB  Status:  Discontinued        2 g 200 mL/hr over 30 Minutes Intravenous Every 8 hours 04/06/23 2349 04/07/23 0108   04/06/23 2330  metroNIDAZOLE (FLAGYL) IVPB 500 mg        500 mg 100 mL/hr over 60 Minutes Intravenous  Once 04/06/23 2321 04/07/23 0032   04/06/23 2300  piperacillin-tazobactam (ZOSYN) IVPB 3.375 g  Status:  Discontinued        3.375 g 100 mL/hr over 30 Minutes Intravenous  Once 04/06/23 2257 04/06/23 2320   04/06/23 2300  vancomycin (VANCOREADY) IVPB 2000 mg/400 mL  Status:  Discontinued        2,000 mg 200 mL/hr over 120 Minutes Intravenous  Once 04/06/23 2257 04/06/23 2312        Objective: Vitals:   06/03/23 1613 06/03/23 1951 06/04/23 0348 06/04/23 0843  BP: (!) 146/75 (!) 146/81 (!) 147/82 (!) 151/82  Pulse: 83 79 77 86  Resp: 16 18 18 17   Temp: 98.7 F (37.1 C) 98.4 F (36.9 C) 97.7 F (36.5 C) 98.2 F (36.8 C)  TempSrc:  Oral Oral Oral Oral  SpO2: 98% 100% 96% 100%  Weight:      Height:        Intake/Output Summary (Last 24 hours) at 06/04/2023 1300 Last data filed at 06/04/2023 1000 Gross per 24 hour  Intake 120 ml  Output 700 ml  Net -580 ml   Filed Weights   06/01/23 0445 06/02/23 0430 06/03/23 0500  Weight: 132 kg 133.1 kg 129.8 kg    Examination:  General exam: Appears calm and comfortable    Data Reviewed: I have personally reviewed following labs and imaging studies  CBC: Recent Labs  Lab 06/03/23 0626  WBC 4.7  HGB 10.1*  HCT 33.7*  MCV 86.0  PLT 296   Basic Metabolic Panel: Recent Labs  Lab 06/03/23 0626  NA 134*  K 4.2  CL 106  CO2 23  GLUCOSE 187*  BUN 7  CREATININE 0.96  CALCIUM 7.2*   GFR: Estimated Creatinine Clearance: 139.9 mL/min (by C-G formula based on SCr of 0.96 mg/dL). Liver Function Tests: No results for input(s): "AST", "ALT", "ALKPHOS", "BILITOT", "PROT", "ALBUMIN" in the last 168 hours. No results for input(s): "LIPASE", "AMYLASE" in the last 168 hours. No results for input(s): "AMMONIA" in the last 168 hours. Coagulation Profile: No results for input(s): "INR", "PROTIME" in the last 168 hours. Cardiac Enzymes: No results for input(s): "CKTOTAL", "CKMB", "CKMBINDEX", "TROPONINI" in the last 168 hours. BNP (last 3 results) No results for input(s): "PROBNP" in the last 8760 hours. HbA1C: No results for input(s): "HGBA1C" in the last 72 hours. CBG: No results for input(s): "GLUCAP" in the last 168 hours. Lipid Profile: No results for input(s): "CHOL", "HDL", "LDLCALC", "TRIG", "CHOLHDL", "LDLDIRECT" in the last 72 hours. Thyroid Function Tests: No results for input(s): "TSH", "T4TOTAL", "FREET4", "T3FREE", "THYROIDAB" in the last 72 hours. Anemia Panel: No results for input(s): "VITAMINB12", "FOLATE", "FERRITIN", "TIBC", "IRON", "RETICCTPCT" in the last 72 hours. Sepsis Labs: No results for input(s): "PROCALCITON", "LATICACIDVEN" in the last  168 hours.  No results found for this or any previous visit (from the past 240 hours).    Radiology Studies: No results found.    Scheduled Meds:  docusate sodium  100 mg Oral BID   enoxaparin (LOVENOX) injection  65 mg Subcutaneous Daily   feeding supplement  237 mL Oral BID BM   Gerhardt's butt cream   Topical BID   hydrocortisone   Rectal BID   lidocaine  2 patch Transdermal Q24H   melatonin  5 mg Oral QHS   metoprolol succinate  200 mg Oral Daily   pantoprazole  40 mg Oral BID   polyethylene glycol  17 g Oral BID   saccharomyces boulardii  250 mg Oral BID   sacubitril-valsartan  1 tablet Oral BID   Continuous Infusions:   LOS: 58 days   Time spent: 20 minutes   Noralee Stain, DO [facility] 06/04/2023, 1:00 PM   Available via Epic secure chat 7am-7pm After these hours, please refer to coverage provider listed on [email or web address]

## 2023-06-05 DIAGNOSIS — R6520 Severe sepsis without septic shock: Secondary | ICD-10-CM | POA: Diagnosis not present

## 2023-06-05 DIAGNOSIS — A419 Sepsis, unspecified organism: Secondary | ICD-10-CM | POA: Diagnosis not present

## 2023-06-05 DIAGNOSIS — N179 Acute kidney failure, unspecified: Secondary | ICD-10-CM | POA: Diagnosis not present

## 2023-06-05 NOTE — Progress Notes (Signed)
PROGRESS NOTE    Alan Tapia.  WUJ:[id number] DOB: [birth year] DOA: 04/06/2023 PCP: Patient, No Pcp Per     Brief Narrative:  Alan Tapia. Is a 48 year old male with past medical history significant for left lower extremity BKA, dilated cardiomyopathy, history of combined systolic and diastolic heart failure with EF 35 to 40%, hypertension, insulin-dependent diabetes, peripheral neuropathy, hyperlipidemia who presented to the emergency department on 04/06/2023 with complaints of odor, wound of his foot. He was also having nausea, vomiting and diarrhea. Orthopedic surgery was consulted and patient underwent right BKA 11/27 by Dr. Lajoyce Corners.  Hospitalization prolonged due to difficult to place status.  New events last 24 hours / Subjective: No new issues  Assessment & Plan:   Principal Problem:   Sepsis (HCC) Active Problems:   Essential hypertension   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   CKD (chronic kidney disease), stage II   S/P BKA (below knee amputation), right (HCC) - 04-08-2023   Acute on chronic anemia   Chronic pain   Acute osteomyelitis of right foot (HCC)   Insulin dependent type 2 diabetes mellitus (HCC)   Hx of left BKA (HCC)   PVD (peripheral vascular disease) (HCC)   Subacute osteomyelitis of right foot (HCC)   Cutaneous abscess of right foot   Malnutrition of moderate degree   COVID-19 virus infection   Pressure injury of skin   Obesity, Class II, BMI 35-39.9    Sepsis Present on admission, secondary to right foot osteomyelitis. Blood cultures with no growth.   Right foot osteomyelitis s/p right BKA Patient was initially treated empirically with antibiotics. Orthopedic surgery performed a right BKA on 11/27. Antibiotics discontinued.   CKD stage II Stable.   Chronic combined systolic and diastolic heart failure Stable. -Continue Toprol XL and Entresto   Primary hypertension -Continue Toprol XL  and Entresto   Diarrhea Resolved.   Abdominal edema Mostly to left side of abdomen with resultant fluid filled striae and mild dermatitis. Patient declining diuresis at this time. -Change positioning in bed to offload fluid from left side of abdomen   Hemorrhoids Anusol cream   Community acquired pneumonia Patient treated with Zosyn and doxycycline. Resolved.   Acute on chronic anemia Patient with hemoglobin drop down to 5.9 g/dL secondary to right foot wound, requiring 3 units of PRBC on 11/26. Patient requiring another 1 unit of PRBC on 11/30. Stable.   COVID-19 infection Patient completed isolation protocol. Patient treated with Paxlovid.   Constipation -Continue bowel regimen    In agreement with assessment of the pressure ulcer as below:  Pressure Injury 04/13/23 Perineum Bilateral Stage 2 -  Partial thickness loss of dermis presenting as a shallow open injury with a red, pink wound bed without slough. perineum/scrotum masd on admission (Active)  04/13/23 1100  Location: Perineum  Location Orientation: Bilateral  Staging: Stage 2 -  Partial thickness loss of dermis presenting as a shallow open injury with a red, pink wound bed without slough.  Wound Description (Comments): perineum/scrotum masd on admission  Present on Admission:   Dressing Type None 06/04/23 0845     Nutrition Problem: Moderate Malnutrition Etiology: chronic illness   DVT prophylaxis: Lovenox  Code Status: Full code Family Communication: At bedside Disposition Plan: SNF Status is: Inpatient Remains inpatient appropriate because: SNF placement pending    Antimicrobials:  Anti-infectives (From admission, onward)    Start     Dose/Rate Route Frequency Ordered Stop  04/30/23 1800  nirmatrelvir/ritonavir (PAXLOVID) 3 tablet        3 tablet Oral 2 times daily 04/30/23 1621 05/05/23 0820   04/09/23 1345  doxycycline (VIBRAMYCIN) 100 mg in dextrose 5 % 250 mL IVPB        100 mg 125 mL/hr over  120 Minutes Intravenous Every 12 hours 04/09/23 1252 04/09/23 1746   04/08/23 1300  levofloxacin (LEVAQUIN) IVPB 500 mg  Status:  Discontinued        500 mg 100 mL/hr over 60 Minutes Intravenous On call to O.R. 04/08/23 1229 04/08/23 1648   04/08/23 1300  vancomycin (VANCOREADY) IVPB 1500 mg/300 mL        1,500 mg 100 mL/hr over 180 Minutes Intravenous On call to O.R. 04/08/23 1229 04/08/23 1433   04/08/23 1237  vancomycin HCl (VANCOREADY) 1500 MG/300ML IVPB       Note to Pharmacy: [name] D: cabinet override      04/08/23 1237 04/08/23 1439   04/07/23 0800  piperacillin-tazobactam (ZOSYN) IVPB 3.375 g        3.375 g 12.5 mL/hr over 240 Minutes Intravenous Every 8 hours 04/07/23 0108 04/09/23 2108   04/07/23 0100  doxycycline (VIBRAMYCIN) 100 mg in dextrose 5 % 250 mL IVPB  Status:  Discontinued        100 mg 125 mL/hr over 120 Minutes Intravenous Every 12 hours 04/07/23 0057 04/09/23 1252   04/07/23 0000  ceFEPIme (MAXIPIME) 2 g in sodium chloride 0.9 % 100 mL IVPB  Status:  Discontinued        2 g 200 mL/hr over 30 Minutes Intravenous Every 8 hours 04/06/23 2349 04/07/23 0108   04/06/23 2330  metroNIDAZOLE (FLAGYL) IVPB 500 mg        500 mg 100 mL/hr over 60 Minutes Intravenous  Once 04/06/23 2321 04/07/23 0032   04/06/23 2300  piperacillin-tazobactam (ZOSYN) IVPB 3.375 g  Status:  Discontinued        3.375 g 100 mL/hr over 30 Minutes Intravenous  Once 04/06/23 2257 04/06/23 2320   04/06/23 2300  vancomycin (VANCOREADY) IVPB 2000 mg/400 mL  Status:  Discontinued        2,000 mg 200 mL/hr over 120 Minutes Intravenous  Once 04/06/23 2257 04/06/23 2312        Objective: Vitals:   06/04/23 1534 06/04/23 2000 06/05/23 0501 06/05/23 0727  BP: (!) 145/85 134/69 (!) 142/75 132/71  Pulse: 79 74 77 82  Resp: 16  16 17   Temp: 98 F (36.7 C) 98.4 F (36.9 C) 98.1 F (36.7 C) 97.6 F (36.4 C)  TempSrc: Oral Oral Oral Oral  SpO2: 100% 99% 98% 100%  Weight:      Height:        No intake or output data in the 24 hours ending 06/05/23 1515  Filed Weights   06/01/23 0445 06/02/23 0430 06/03/23 0500  Weight: 132 kg 133.1 kg 129.8 kg    Examination:  General exam: Appears calm and comfortable    Data Reviewed: I have personally reviewed following labs and imaging studies  CBC: Recent Labs  Lab 06/03/23 0626  WBC 4.7  HGB 10.1*  HCT 33.7*  MCV 86.0  PLT 296   Basic Metabolic Panel: Recent Labs  Lab 06/03/23 0626  NA 134*  K 4.2  CL 106  CO2 23  GLUCOSE 187*  BUN 7  CREATININE 0.96  CALCIUM 7.2*   GFR: Estimated Creatinine Clearance: 138.4 mL/min (by C-G formula based on SCr of  0.96 mg/dL). Liver Function Tests: No results for input(s): "AST", "ALT", "ALKPHOS", "BILITOT", "PROT", "ALBUMIN" in the last 168 hours. No results for input(s): "LIPASE", "AMYLASE" in the last 168 hours. No results for input(s): "AMMONIA" in the last 168 hours. Coagulation Profile: No results for input(s): "INR", "PROTIME" in the last 168 hours. Cardiac Enzymes: No results for input(s): "CKTOTAL", "CKMB", "CKMBINDEX", "TROPONINI" in the last 168 hours. BNP (last 3 results) No results for input(s): "PROBNP" in the last 8760 hours. HbA1C: No results for input(s): "HGBA1C" in the last 72 hours. CBG: No results for input(s): "GLUCAP" in the last 168 hours. Lipid Profile: No results for input(s): "CHOL", "HDL", "LDLCALC", "TRIG", "CHOLHDL", "LDLDIRECT" in the last 72 hours. Thyroid Function Tests: No results for input(s): "TSH", "T4TOTAL", "FREET4", "T3FREE", "THYROIDAB" in the last 72 hours. Anemia Panel: No results for input(s): "VITAMINB12", "FOLATE", "FERRITIN", "TIBC", "IRON", "RETICCTPCT" in the last 72 hours. Sepsis Labs: No results for input(s): "PROCALCITON", "LATICACIDVEN" in the last 168 hours.  No results found for this or any previous visit (from the past 240 hours).    Radiology Studies: No results found.    Scheduled Meds:  docusate  sodium  100 mg Oral BID   enoxaparin (LOVENOX) injection  65 mg Subcutaneous Daily   feeding supplement  237 mL Oral BID BM   Gerhardt's butt cream   Topical BID   hydrocortisone   Rectal BID   lidocaine  2 patch Transdermal Q24H   melatonin  5 mg Oral QHS   metoprolol succinate  200 mg Oral Daily   pantoprazole  40 mg Oral BID   polyethylene glycol  17 g Oral BID   saccharomyces boulardii  250 mg Oral BID   sacubitril-valsartan  1 tablet Oral BID   Continuous Infusions:   LOS: 59 days   Time spent: 15 minutes   Noralee Stain, DO [facility] 06/05/2023, 3:15 PM   Available via Epic secure chat 7am-7pm After these hours, please refer to coverage provider listed on [email or web address]

## 2023-06-06 DIAGNOSIS — A419 Sepsis, unspecified organism: Secondary | ICD-10-CM | POA: Diagnosis not present

## 2023-06-06 DIAGNOSIS — N179 Acute kidney failure, unspecified: Secondary | ICD-10-CM | POA: Diagnosis not present

## 2023-06-06 DIAGNOSIS — R6520 Severe sepsis without septic shock: Secondary | ICD-10-CM | POA: Diagnosis not present

## 2023-06-06 NOTE — Progress Notes (Signed)
PROGRESS NOTE    Alan Tapia.  ZHY:[id number] DOB: 09-20-1975 DOA: 04/06/2023 PCP: Patient, No Pcp Per     Brief Narrative:  Alan Tapia. Is a 48 year old male with past medical history significant for left lower extremity BKA, dilated cardiomyopathy, history of combined systolic and diastolic heart failure with EF 35 to 40%, hypertension, insulin-dependent diabetes, peripheral neuropathy, hyperlipidemia who presented to the emergency department on 04/06/2023 with complaints of odor, wound of his foot. He was also having nausea, vomiting and diarrhea. Orthopedic surgery was consulted and patient underwent right BKA 11/27 by Dr. Lajoyce Corners.  Hospitalization prolonged due to difficult to place status.  New events last 24 hours / Subjective: No new issues.  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 Blood cultures with no growth.   Right foot osteomyelitis s/p right BKA Patient was initially treated empirically with antibiotics. Orthopedic surgery performed a right BKA on 11/27. Antibiotics discontinued.   CKD stage II Stable.   Chronic combined systolic and diastolic heart  failure Stable. -Continue Toprol XL and Entresto   Primary hypertension -Continue Toprol XL and Entresto   Diarrhea Resolved.   Abdominal edema Mostly to left side of abdomen with resultant fluid filled striae and mild dermatitis. Patient declining diuresis at this time. -Change positioning in bed to offload fluid from left side of abdomen   Hemorrhoids Anusol cream   Community acquired pneumonia Patient treated with Zosyn and doxycycline. Resolved.   Acute on chronic anemia Patient with hemoglobin drop down to 5.9 g/dL secondary to right foot wound, requiring 3 units of PRBC on 11/26. Patient requiring another 1 unit of PRBC on 11/30. Stable.   COVID-19 infection Patient completed isolation protocol. Patient treated with Paxlovid.   Constipation -Continue bowel regimen    In agreement with assessment of the pressure ulcer as below:  Pressure Injury 04/13/23 Perineum Bilateral Stage 2 -  Partial thickness loss of dermis presenting as a shallow open injury with a red, pink wound bed without slough. perineum/scrotum masd on admission (Active)  04/13/23 1100  Location: Perineum  Location Orientation: Bilateral  Staging: Stage 2 -  Partial thickness loss of dermis presenting as a shallow open injury with a red, pink wound bed without slough.  Wound Description (Comments): perineum/scrotum masd on admission  Present on Admission:   Dressing Type None 06/05/23 1930     Nutrition Problem: Moderate Malnutrition Etiology: chronic illness   DVT prophylaxis: Lovenox  Code Status: Full code Family Communication: At bedside Disposition Plan: SNF Status is: Inpatient Remains inpatient appropriate because: SNF placement pending    Antimicrobials:  Anti-infectives (From admission, onward)  Start     Dose/Rate Route Frequency Ordered Stop   04/30/23 1800  nirmatrelvir/ritonavir (PAXLOVID) 3 tablet        3 tablet Oral 2 times daily 04/30/23 1621 05/05/23 0820   04/09/23  1345  doxycycline (VIBRAMYCIN) 100 mg in dextrose 5 % 250 mL IVPB        100 mg 125 mL/hr over 120 Minutes Intravenous Every 12 hours 04/09/23 1252 04/09/23 1746   04/08/23 1300  levofloxacin (LEVAQUIN) IVPB 500 mg  Status:  Discontinued        500 mg 100 mL/hr over 60 Minutes Intravenous On call to O.R. 04/08/23 1229 04/08/23 1648   04/08/23 1300  vancomycin (VANCOREADY) IVPB 1500 mg/300 mL        1,500 mg 100 mL/hr over 180 Minutes Intravenous On call to O.R. 04/08/23 1229 04/08/23 1433   04/08/23 1237  vancomycin HCl (VANCOREADY) 1500 MG/300ML IVPB       Note to Pharmacy: [name] D: cabinet override      04/08/23 1237 04/08/23 1439   04/07/23 0800  piperacillin-tazobactam (ZOSYN) IVPB 3.375 g        3.375 g 12.5 mL/hr over 240 Minutes Intravenous Every 8 hours 04/07/23 0108 04/09/23 2108   04/07/23 0100  doxycycline (VIBRAMYCIN) 100 mg in dextrose 5 % 250 mL IVPB  Status:  Discontinued        100 mg 125 mL/hr over 120 Minutes Intravenous Every 12 hours 04/07/23 0057 04/09/23 1252   04/07/23 0000  ceFEPIme (MAXIPIME) 2 g in sodium chloride 0.9 % 100 mL IVPB  Status:  Discontinued        2 g 200 mL/hr over 30 Minutes Intravenous Every 8 hours 04/06/23 2349 04/07/23 0108   04/06/23 2330  metroNIDAZOLE (FLAGYL) IVPB 500 mg        500 mg 100 mL/hr over 60 Minutes Intravenous  Once 04/06/23 2321 04/07/23 0032   04/06/23 2300  piperacillin-tazobactam (ZOSYN) IVPB 3.375 g  Status:  Discontinued        3.375 g 100 mL/hr over 30 Minutes Intravenous  Once 04/06/23 2257 04/06/23 2320   04/06/23 2300  vancomycin (VANCOREADY) IVPB 2000 mg/400 mL  Status:  Discontinued        2,000 mg 200 mL/hr over 120 Minutes Intravenous  Once 04/06/23 2257 04/06/23 2312        Objective: Vitals:   06/05/23 1601 06/05/23 2008 06/06/23 0339 06/06/23 0750  BP: (!) 153/89 (!) 145/80 (!) 143/78 (!) 148/85  Pulse: 76 76 75 90  Resp: 17 16 18 16   Temp: 98 F (36.7 C) 98 F (36.7 C) 98.7 F (37.1  C) 98.1 F (36.7 C)  TempSrc: Oral Oral Oral Oral  SpO2: 100% 98% 99% 100%  Weight:      Height:       No intake or output data in the 24 hours ending 06/06/23 1204  Filed Weights   06/01/23 0445 06/02/23 0430 06/03/23 0500  Weight: 132 kg 133.1 kg 129.8 kg    Examination:  General exam: Appears calm and comfortable    Data Reviewed: I have personally reviewed following labs and imaging studies  CBC: Recent Labs  Lab 06/03/23 0626  WBC 4.7  HGB 10.1*  HCT 33.7*  MCV 86.0  PLT 296   Basic Metabolic Panel: Recent Labs  Lab 06/03/23 0626  NA 134*  K 4.2  CL 106  CO2 23  GLUCOSE 187*  BUN 7  CREATININE 0.96  CALCIUM 7.2*  GFR: Estimated Creatinine Clearance: 138.4 mL/min (by C-G formula based on SCr of 0.96 mg/dL). Liver Function Tests: No results for input(s): "AST", "ALT", "ALKPHOS", "BILITOT", "PROT", "ALBUMIN" in the last 168 hours. No results for input(s): "LIPASE", "AMYLASE" in the last 168 hours. No results for input(s): "AMMONIA" in the last 168 hours. Coagulation Profile: No results for input(s): "INR", "PROTIME" in the last 168 hours. Cardiac Enzymes: No results for input(s): "CKTOTAL", "CKMB", "CKMBINDEX", "TROPONINI" in the last 168 hours. BNP (last 3 results) No results for input(s): "PROBNP" in the last 8760 hours. HbA1C: No results for input(s): "HGBA1C" in the last 72 hours. CBG: No results for input(s): "GLUCAP" in the last 168 hours. Lipid Profile: No results for input(s): "CHOL", "HDL", "LDLCALC", "TRIG", "CHOLHDL", "LDLDIRECT" in the last 72 hours. Thyroid Function Tests: No results for input(s): "TSH", "T4TOTAL", "FREET4", "T3FREE", "THYROIDAB" in the last 72 hours. Anemia Panel: No results for input(s): "VITAMINB12", "FOLATE", "FERRITIN", "TIBC", "IRON", "RETICCTPCT" in the last 72 hours. Sepsis Labs: No results for input(s): "PROCALCITON", "LATICACIDVEN" in the last 168 hours.  No results found for this or any previous visit  (from the past 240 hours).    Radiology Studies: No results found.    Scheduled Meds:  docusate sodium  100 mg Oral BID   enoxaparin (LOVENOX) injection  65 mg Subcutaneous Daily   feeding supplement  237 mL Oral BID BM   Gerhardt's butt cream   Topical BID   hydrocortisone   Rectal BID   lidocaine  2 patch Transdermal Q24H   melatonin  5 mg Oral QHS   metoprolol succinate  200 mg Oral Daily   pantoprazole  40 mg Oral BID   polyethylene glycol  17 g Oral BID   saccharomyces boulardii  250 mg Oral BID   sacubitril-valsartan  1 tablet Oral BID   Continuous Infusions:   LOS: 60 days   Time spent: 15 minutes   Noralee Stain, DO [facility] 06/06/2023, 12:04 PM   Available via Epic secure chat 7am-7pm After these hours, please refer to coverage provider listed on [email or web address]

## 2023-06-07 NOTE — Progress Notes (Signed)
PROGRESS NOTE    Alan Tapia.  ZOX:[id number] DOB: Nov 02, 1975 DOA: 04/06/2023 PCP: Patient, No Pcp Per     Brief Narrative:  Alan Tapia. Is a 48 year old male with past medical history significant for left lower extremity BKA, dilated cardiomyopathy, history of combined systolic and diastolic heart failure with EF 35 to 40%, hypertension, insulin-dependent diabetes, peripheral neuropathy, hyperlipidemia who presented to the emergency department on 04/06/2023 with complaints of odor, wound of his foot. He was also having nausea, vomiting and diarrhea. Orthopedic surgery was consulted and patient underwent right BKA 11/27 by Dr. Lajoyce Corners.  Hospitalization prolonged due to difficult to place status.  New events last 24 hours / Subjective: Awaiting placement.  Difficult to place list.  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 Blood cultures with no growth.   Right foot osteomyelitis s/p right BKA Patient was initially treated empirically with antibiotics. Orthopedic surgery performed a right BKA on 11/27. Antibiotics discontinued.   CKD stage II Stable.   Chronic combined systolic and diastolic heart failure Stable. -Continue Toprol XL and Entresto   Primary  hypertension -Continue Toprol XL and Entresto   Diarrhea Resolved.   Abdominal edema Mostly to left side of abdomen with resultant fluid filled striae and mild dermatitis. Patient declining diuresis at this time. -Change positioning in bed to offload fluid from left side of abdomen   Hemorrhoids Treated with Anusol cream   Community acquired pneumonia Patient treated with Zosyn and doxycycline. Resolved.   Acute on chronic anemia Patient with hemoglobin drop down to 5.9 g/dL secondary to right foot wound, requiring 3 units of PRBC on 11/26. Patient requiring another 1 unit of PRBC on 11/30. Stable.   COVID-19 infection Patient completed isolation protocol. Patient treated with Paxlovid.   Constipation -Continue bowel regimen    In agreement with assessment of the pressure ulcer as below:  Pressure Injury 04/13/23 Perineum Bilateral Stage 2 -  Partial thickness loss of dermis presenting as a shallow open injury with a red, pink wound bed without slough. perineum/scrotum masd on admission (Active)  04/13/23 1100  Location: Perineum  Location Orientation: Bilateral  Staging: Stage 2 -  Partial thickness loss of dermis presenting as a shallow open injury with a red, pink wound bed without slough.  Wound Description (Comments): perineum/scrotum masd on admission  Present on Admission:   Dressing Type None 06/06/23 1930     Nutrition Problem: Moderate Malnutrition Etiology: chronic illness   DVT prophylaxis: Lovenox  Code Status: Full code Family Communication:  Disposition Plan: SNF Status is: Inpatient Remains inpatient appropriate because: SNF placement pending    Antimicrobials:  Anti-infectives (From admission, onward)    Start     Dose/Rate  Route Frequency Ordered Stop   04/30/23 1800  nirmatrelvir/ritonavir (PAXLOVID) 3 tablet        3 tablet Oral 2 times daily 04/30/23 1621 05/05/23 0820   04/09/23 1345  doxycycline (VIBRAMYCIN) 100 mg in dextrose 5 % 250  mL IVPB        100 mg 125 mL/hr over 120 Minutes Intravenous Every 12 hours 04/09/23 1252 04/09/23 1746   04/08/23 1300  levofloxacin (LEVAQUIN) IVPB 500 mg  Status:  Discontinued        500 mg 100 mL/hr over 60 Minutes Intravenous On call to O.R. 04/08/23 1229 04/08/23 1648   04/08/23 1300  vancomycin (VANCOREADY) IVPB 1500 mg/300 mL        1,500 mg 100 mL/hr over 180 Minutes Intravenous On call to O.R. 04/08/23 1229 04/08/23 1433   04/08/23 1237  vancomycin HCl (VANCOREADY) 1500 MG/300ML IVPB       Note to Pharmacy: [name] D: cabinet override      04/08/23 1237 04/08/23 1439   04/07/23 0800  piperacillin-tazobactam (ZOSYN) IVPB 3.375 g        3.375 g 12.5 mL/hr over 240 Minutes Intravenous Every 8 hours 04/07/23 0108 04/09/23 2108   04/07/23 0100  doxycycline (VIBRAMYCIN) 100 mg in dextrose 5 % 250 mL IVPB  Status:  Discontinued        100 mg 125 mL/hr over 120 Minutes Intravenous Every 12 hours 04/07/23 0057 04/09/23 1252   04/07/23 0000  ceFEPIme (MAXIPIME) 2 g in sodium chloride 0.9 % 100 mL IVPB  Status:  Discontinued        2 g 200 mL/hr over 30 Minutes Intravenous Every 8 hours 04/06/23 2349 04/07/23 0108   04/06/23 2330  metroNIDAZOLE (FLAGYL) IVPB 500 mg        500 mg 100 mL/hr over 60 Minutes Intravenous  Once 04/06/23 2321 04/07/23 0032   04/06/23 2300  piperacillin-tazobactam (ZOSYN) IVPB 3.375 g  Status:  Discontinued        3.375 g 100 mL/hr over 30 Minutes Intravenous  Once 04/06/23 2257 04/06/23 2320   04/06/23 2300  vancomycin (VANCOREADY) IVPB 2000 mg/400 mL  Status:  Discontinued        2,000 mg 200 mL/hr over 120 Minutes Intravenous  Once 04/06/23 2257 04/06/23 2312        Objective: Vitals:   06/06/23 0750 06/06/23 1538 06/06/23 1956 06/07/23 0455  BP: (!) 148/85 (!) 144/80 (!) 157/89 (!) 151/76  Pulse: 90 74 78 72  Resp: 16 16 18 18   Temp: 98.1 F (36.7 C) 98.2 F (36.8 C) 98.4 F (36.9 C) 97.8 F (36.6 C)  TempSrc: Oral Oral Oral Oral   SpO2: 100% 96% 99% 98%  Weight:      Height:        Intake/Output Summary (Last 24 hours) at 06/07/2023 1229 Last data filed at 06/06/2023 1930 Gross per 24 hour  Intake 240 ml  Output --  Net 240 ml    Filed Weights   06/01/23 0445 06/02/23 0430 06/03/23 0500  Weight: 132 kg 133.1 kg 129.8 kg    Data Reviewed: I have personally reviewed following labs and imaging studies  CBC: Recent Labs  Lab 06/03/23 0626  WBC 4.7  HGB 10.1*  HCT 33.7*  MCV 86.0  PLT 296   Basic Metabolic Panel: Recent Labs  Lab 06/03/23 0626  NA 134*  K 4.2  CL 106  CO2 23  GLUCOSE 187*  BUN 7  CREATININE 0.96  CALCIUM 7.2*   GFR: Estimated Creatinine Clearance: 138.4 mL/min (by C-G formula based on SCr of 0.96 mg/dL). Liver Function Tests: No results for input(s): "AST", "ALT", "ALKPHOS", "BILITOT", "PROT", "ALBUMIN" in the last 168 hours. No results for input(s): "LIPASE", "AMYLASE" in the last 168 hours. No results for input(s): "AMMONIA" in the last 168 hours. Coagulation Profile: No results for input(s): "INR", "PROTIME" in the last 168 hours. Cardiac Enzymes: No results for input(s): "CKTOTAL", "CKMB", "CKMBINDEX", "TROPONINI" in the last 168 hours. BNP (last 3 results) No results for input(s): "PROBNP" in the last 8760 hours. HbA1C: No results for input(s): "HGBA1C" in the last 72 hours. CBG: No results for input(s): "GLUCAP" in the last 168 hours. Lipid Profile: No results for input(s): "CHOL", "HDL", "LDLCALC", "TRIG", "CHOLHDL", "LDLDIRECT" in the last 72 hours. Thyroid Function Tests: No results for input(s): "TSH", "T4TOTAL", "FREET4", "T3FREE", "THYROIDAB" in the last 72 hours. Anemia Panel: No results for input(s): "VITAMINB12", "FOLATE", "FERRITIN", "TIBC", "IRON", "RETICCTPCT" in the last 72 hours. Sepsis Labs: No results for input(s): "PROCALCITON", "LATICACIDVEN" in the last 168 hours.  No results found for this or any previous visit (from the past 240 hours).     Radiology Studies: No results found.    Scheduled Meds:  docusate sodium  100 mg Oral BID   enoxaparin (LOVENOX) injection  65 mg Subcutaneous Daily   feeding supplement  237 mL Oral BID BM   Gerhardt's butt cream   Topical BID   lidocaine  2 patch Transdermal Q24H   melatonin  5 mg Oral QHS   metoprolol succinate  200 mg Oral Daily   pantoprazole  40 mg Oral BID   polyethylene glycol  17 g Oral BID   saccharomyces boulardii  250 mg Oral BID   sacubitril-valsartan  1 tablet Oral BID   Continuous Infusions:   LOS: 61 days    Noralee Stain, DO [facility] 06/07/2023, 12:29 PM   Available via Epic secure chat 7am-7pm After these hours, please refer to coverage provider listed on [email or web address]

## 2023-06-08 DIAGNOSIS — R6520 Severe sepsis without septic shock: Secondary | ICD-10-CM | POA: Diagnosis not present

## 2023-06-08 DIAGNOSIS — N179 Acute kidney failure, unspecified: Secondary | ICD-10-CM | POA: Diagnosis not present

## 2023-06-08 DIAGNOSIS — A419 Sepsis, unspecified organism: Secondary | ICD-10-CM | POA: Diagnosis not present

## 2023-06-08 NOTE — Progress Notes (Signed)
PROGRESS NOTE    Alan Tapia.  ZOX:[id number] DOB: 01-29-1976 DOA: 04/06/2023 PCP: Patient, No Pcp Per     Brief Narrative:  Alan Tapia. Is a 48 year old male with past medical history significant for left lower extremity BKA, dilated cardiomyopathy, history of combined systolic and diastolic heart failure with EF 35 to 40%, hypertension, insulin-dependent diabetes, peripheral neuropathy, hyperlipidemia who presented to the emergency department on 04/06/2023 with complaints of odor, wound of his foot. He was also having nausea, vomiting and diarrhea. Orthopedic surgery was consulted and patient underwent right BKA 11/27 by Dr. Lajoyce Corners.  Hospitalization prolonged due to difficult to place status.  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 Blood cultures with no growth.   Right foot osteomyelitis s/p right BKA Patient was initially treated empirically with antibiotics. Orthopedic surgery performed a right BKA on 11/27. Antibiotics discontinued.   CKD stage II Stable.   Chronic combined systolic and diastolic heart failure Stable. -Continue Toprol XL and Entresto   Primary hypertension -Continue  Toprol XL and Entresto   Diarrhea Resolved.   Abdominal edema Mostly to left side of abdomen with resultant fluid filled striae and mild dermatitis. Patient declining diuresis at this time. -Change positioning in bed to offload fluid from left side of abdomen   Hemorrhoids Treated with Anusol cream   Community acquired pneumonia Patient treated with Zosyn and doxycycline. Resolved.   Acute on chronic anemia Patient with hemoglobin drop down to 5.9 g/dL secondary to right foot wound, requiring 3 units of PRBC on 11/26. Patient requiring another 1 unit of PRBC on 11/30. Stable.   COVID-19 infection Patient completed isolation protocol. Patient treated with Paxlovid.   Constipation -Continue bowel regimen    In agreement with assessment of the pressure ulcer as below:  Pressure Injury 04/13/23 Perineum Bilateral Stage 2 -  Partial thickness loss of dermis presenting as a shallow open injury with a red, pink wound bed without slough. perineum/scrotum masd on admission (Active)  04/13/23 1100  Location: Perineum  Location Orientation: Bilateral  Staging: Stage 2 -  Partial thickness loss of dermis presenting as a shallow open injury with a red, pink wound bed without slough.  Wound Description (Comments): perineum/scrotum masd on admission  Present on Admission:   Dressing Type None 06/08/23 0815     Nutrition Problem: Moderate Malnutrition Etiology: chronic illness   DVT prophylaxis: Lovenox  Code Status: Full code Family Communication:  Disposition Plan: SNF Status is: Inpatient Remains inpatient appropriate because: SNF placement pending    Antimicrobials:  Anti-infectives (From admission, onward)    Start     Dose/Rate Route Frequency Ordered  Stop   04/30/23 1800  nirmatrelvir/ritonavir (PAXLOVID) 3 tablet        3 tablet Oral 2 times daily 04/30/23 1621 05/05/23 0820   04/09/23 1345  doxycycline (VIBRAMYCIN) 100 mg in dextrose 5 % 250 mL IVPB        100  mg 125 mL/hr over 120 Minutes Intravenous Every 12 hours 04/09/23 1252 04/09/23 1746   04/08/23 1300  levofloxacin (LEVAQUIN) IVPB 500 mg  Status:  Discontinued        500 mg 100 mL/hr over 60 Minutes Intravenous On call to O.R. 04/08/23 1229 04/08/23 1648   04/08/23 1300  vancomycin (VANCOREADY) IVPB 1500 mg/300 mL        1,500 mg 100 mL/hr over 180 Minutes Intravenous On call to O.R. 04/08/23 1229 04/08/23 1433   04/08/23 1237  vancomycin HCl (VANCOREADY) 1500 MG/300ML IVPB       Note to Pharmacy: [name] D: cabinet override      04/08/23 1237 04/08/23 1439   04/07/23 0800  piperacillin-tazobactam (ZOSYN) IVPB 3.375 g        3.375 g 12.5 mL/hr over 240 Minutes Intravenous Every 8 hours 04/07/23 0108 04/09/23 2108   04/07/23 0100  doxycycline (VIBRAMYCIN) 100 mg in dextrose 5 % 250 mL IVPB  Status:  Discontinued        100 mg 125 mL/hr over 120 Minutes Intravenous Every 12 hours 04/07/23 0057 04/09/23 1252   04/07/23 0000  ceFEPIme (MAXIPIME) 2 g in sodium chloride 0.9 % 100 mL IVPB  Status:  Discontinued        2 g 200 mL/hr over 30 Minutes Intravenous Every 8 hours 04/06/23 2349 04/07/23 0108   04/06/23 2330  metroNIDAZOLE (FLAGYL) IVPB 500 mg        500 mg 100 mL/hr over 60 Minutes Intravenous  Once 04/06/23 2321 04/07/23 0032   04/06/23 2300  piperacillin-tazobactam (ZOSYN) IVPB 3.375 g  Status:  Discontinued        3.375 g 100 mL/hr over 30 Minutes Intravenous  Once 04/06/23 2257 04/06/23 2320   04/06/23 2300  vancomycin (VANCOREADY) IVPB 2000 mg/400 mL  Status:  Discontinued        2,000 mg 200 mL/hr over 120 Minutes Intravenous  Once 04/06/23 2257 04/06/23 2312        Objective: Vitals:   06/07/23 1713 06/07/23 1936 06/08/23 0726 06/08/23 0816  BP: (!) 151/90 (!) 150/86 (!) 143/70 (!) 143/70  Pulse: 78 85 89 89  Resp: 15 16 16    Temp: 98.4 F (36.9 C) 98.2 F (36.8 C) 98.2 F (36.8 C)   TempSrc: Oral Oral Oral   SpO2: 98% 99% 98%   Weight:      Height:        No intake or output data in the 24 hours ending 06/08/23 1355   Filed Weights   06/01/23 0445 06/02/23 0430 06/03/23 0500  Weight: 132 kg 133.1 kg 129.8 kg   Examination: General exam: Appears calm and comfortable    Data Reviewed: I have personally reviewed following labs and imaging studies  CBC: Recent Labs  Lab 06/03/23 0626  WBC 4.7  HGB 10.1*  HCT 33.7*  MCV 86.0  PLT 296   Basic Metabolic Panel: Recent Labs  Lab 06/03/23 0626  NA 134*  K 4.2  CL 106  CO2 23  GLUCOSE 187*  BUN 7  CREATININE 0.96  CALCIUM 7.2*   GFR: Estimated Creatinine Clearance: 138.4 mL/min (by C-G formula based on SCr  of 0.96 mg/dL). Liver Function Tests: No results for input(s): "AST", "ALT", "ALKPHOS", "BILITOT", "PROT", "ALBUMIN" in the last 168 hours. No results for input(s): "LIPASE", "AMYLASE" in the last 168 hours. No results for input(s): "AMMONIA" in the last 168 hours. Coagulation Profile: No results for input(s): "INR", "PROTIME" in the last 168 hours. Cardiac Enzymes: No results for input(s): "CKTOTAL", "CKMB", "CKMBINDEX", "TROPONINI" in the last 168 hours. BNP (last 3 results) No results for input(s): "PROBNP" in the last 8760 hours. HbA1C: No results for input(s): "HGBA1C" in the last 72 hours. CBG: No results for input(s): "GLUCAP" in the last 168 hours. Lipid Profile: No results for input(s): "CHOL", "HDL", "LDLCALC", "TRIG", "CHOLHDL", "LDLDIRECT" in the last 72 hours. Thyroid Function Tests: No results for input(s): "TSH", "T4TOTAL", "FREET4", "T3FREE", "THYROIDAB" in the last 72 hours. Anemia Panel: No results for input(s): "VITAMINB12", "FOLATE", "FERRITIN", "TIBC", "IRON", "RETICCTPCT" in the last 72 hours. Sepsis Labs: No results for input(s): "PROCALCITON", "LATICACIDVEN" in the last 168 hours.  No results found for this or any previous visit (from the past 240 hours).    Radiology Studies: No results found.    Scheduled Meds:  docusate  sodium  100 mg Oral BID   enoxaparin (LOVENOX) injection  65 mg Subcutaneous Daily   feeding supplement  237 mL Oral BID BM   Gerhardt's butt cream   Topical BID   lidocaine  2 patch Transdermal Q24H   melatonin  5 mg Oral QHS   metoprolol succinate  200 mg Oral Daily   pantoprazole  40 mg Oral BID   polyethylene glycol  17 g Oral BID   saccharomyces boulardii  250 mg Oral BID   sacubitril-valsartan  1 tablet Oral BID   Continuous Infusions:   LOS: 62 days   Time spent 15 minutes  Noralee Stain, DO [facility] 06/08/2023, 1:55 PM   Available via Epic secure chat 7am-7pm After these hours, please refer to coverage provider listed on [email or web address]

## 2023-06-09 MED ORDER — AMLODIPINE BESYLATE 5 MG PO TABS
5.0000 mg | ORAL_TABLET | Freq: Every day | ORAL | Status: DC
  Administered 2023-06-09 – 2023-06-29 (×21): 5 mg via ORAL
  Filled 2023-06-09 (×21): qty 1

## 2023-06-09 MED ORDER — ENOXAPARIN SODIUM 60 MG/0.6ML IJ SOSY
60.0000 mg | PREFILLED_SYRINGE | Freq: Every day | INTRAMUSCULAR | Status: DC
  Administered 2023-06-10 – 2023-06-29 (×20): 60 mg via SUBCUTANEOUS
  Filled 2023-06-09 (×20): qty 0.6

## 2023-06-09 NOTE — TOC Progression Note (Addendum)
Transition of Care (TOC) - Progression Note    Patient Details  Name: Alan Tapia. MRN: [id number] Date of Birth: Oct 08, 1975  Transition of Care [facility]) CM/SW Contact  Carley Hammed, LCSW Phone Number: 06/09/2023, 12:14 PM  Clinical Narrative:    CSW notified by spouse that pt is supposed to be in court this morning, documentation requested to prove pt is hospitalized. Documentation provided. CSW followed up with [facility] re[facility] who is working on determining if SS disability is in process and at what stage. CSW also asked if they would consider pt for rehab. TOC will continue to follow.   2:30 Pt's spouse noted that [employer] has been calling and asking about the equipment and spouse advised that CSW would follow up. CM reached out to [employer] rep who is looking into it. Per spouse, all of pt's equipment is at the house and he has a ramp to get in. At this time, placement options are not available and CSW/ CM to start the conversation about transitioning home with supports. Spouse asks to discuss this tomorrow so she can get more information and showed CSW a Medicaid application that she stated [place] with Financial counseling said she had a week to complete. CSW reached out to [place] who noted that the documentation in question is for the SS disability that was needed for pt placement. Per Meriam Sprague she has gotten pt and spouse several extensions, but the paperwork was never filed, and the last extension ended on the 21st, she is attempting for another but unsure if they will allow it.  CSW spoke with Hale Bogus at [facility] and advised of shift to home at [place]. Hale Bogus to follow up in the community to confirm services. TOC will continue to follow.   Expected Discharge Plan: Skilled Nursing Facility Barriers to Discharge: Insurance Authorization, Continued Medical Work up, SNF Pending bed offer  Expected Discharge Plan and Services In-house Referral: Clinical Social Work     Living arrangements for  the past 2 months: Single Family Home Expected Discharge Date: 04/11/23                                     Social Determinants of Health (SDOH) Interventions SDOH Screenings   Food Insecurity: No Food Insecurity (04/07/2023)  Housing: Unknown (04/23/2023)  Recent Concern: Housing - Medium Risk (04/07/2023)  Transportation Needs: No Transportation Needs (04/07/2023)  Recent Concern: Transportation Needs - Unmet Transportation Needs (02/17/2023)   Received from [facility]  Utilities: At Risk (04/07/2023)  Alcohol Screen: Low Risk  (08/20/2022)  Financial Resource Strain: Patient Unable To Answer (08/20/2022)  Tobacco Use: Low Risk  (04/08/2023)    Readmission Risk Interventions    04/07/2023    2:34 PM  Readmission Risk Prevention Plan  Transportation Screening Complete  PCP or Specialist Appt within 3-5 Days Complete  HRI or Home Care Consult Complete  Social Work Consult for Recovery Care Planning/Counseling Complete  Palliative Care Screening Not Applicable  Medication Review [employer]) Complete

## 2023-06-09 NOTE — Plan of Care (Signed)
Problem: Fluid Volume: Goal: Hemodynamic stability will improve Outcome: Progressing   Problem: Clinical Measurements: Goal: Diagnostic test results will improve Outcome: Progressing Goal: Signs and symptoms of infection will decrease Outcome: Progressing   Problem: Respiratory: Goal: Ability to maintain adequate ventilation will improve Outcome: Progressing   Problem: Education: Goal: Knowledge of General Education information will improve Description: Including pain rating scale, medication(s)/side effects and non-pharmacologic comfort measures Outcome: Progressing   Problem: Health Behavior/Discharge Planning: Goal: Ability to manage health-related needs will improve Outcome: Progressing   Problem: Clinical Measurements: Goal: Ability to maintain clinical measurements within normal limits will improve Outcome: Progressing Goal: Will remain free from infection Outcome: Progressing Goal: Diagnostic test results will improve Outcome: Progressing Goal: Respiratory complications will improve Outcome: Progressing Goal: Cardiovascular complication will be avoided Outcome: Progressing   Problem: Activity: Goal: Risk for activity intolerance will decrease Outcome: Progressing   Problem: Nutrition: Goal: Adequate nutrition will be maintained Outcome: Progressing   Problem: Coping: Goal: Level of anxiety will decrease Outcome: Progressing   Problem: Elimination: Goal: Will not experience complications related to bowel motility Outcome: Progressing Goal: Will not experience complications related to urinary retention Outcome: Progressing   Problem: Pain Management: Goal: General experience of comfort will improve Outcome: Progressing   Problem: Safety: Goal: Ability to remain free from injury will improve Outcome: Progressing   Problem: Skin Integrity: Goal: Risk for impaired skin integrity will decrease Outcome: Progressing   Problem: Activity: Goal: Ability  to tolerate increased activity will improve Outcome: Progressing   Problem: Clinical Measurements: Goal: Ability to maintain a body temperature in the normal range will improve Outcome: Progressing   Problem: Respiratory: Goal: Ability to maintain adequate ventilation will improve Outcome: Progressing Goal: Ability to maintain a clear airway will improve Outcome: Progressing   Problem: Education: Goal: Knowledge of the prescribed therapeutic regimen will improve Outcome: Progressing Goal: Ability to verbalize activity precautions or restrictions will improve Outcome: Progressing Goal: Understanding of discharge needs will improve Outcome: Progressing   Problem: Activity: Goal: Ability to perform//tolerate increased activity and mobilize with assistive devices will improve Outcome: Progressing   Problem: Clinical Measurements: Goal: Postoperative complications will be avoided or minimized Outcome: Progressing   Problem: Self-Care: Goal: Ability to meet self-care needs will improve Outcome: Progressing   Problem: Self-Concept: Goal: Ability to maintain and perform role responsibilities to the fullest extent possible will improve Outcome: Progressing   Problem: Education: Goal: Knowledge of General Education information will improve Description: Including pain rating scale, medication(s)/side effects and non-pharmacologic comfort measures Outcome: Progressing   Problem: Health Behavior/Discharge Planning: Goal: Ability to manage health-related needs will improve Outcome: Progressing   Problem: Clinical Measurements: Goal: Ability to maintain clinical measurements within normal limits will improve Outcome: Progressing Goal: Will remain free from infection Outcome: Progressing Goal: Diagnostic test results will improve Outcome: Progressing Goal: Respiratory complications will improve Outcome: Progressing Goal: Cardiovascular complication will be avoided Outcome:  Progressing   Problem: Activity: Goal: Risk for activity intolerance will decrease Outcome: Progressing   Problem: Nutrition: Goal: Adequate nutrition will be maintained Outcome: Progressing   Problem: Coping: Goal: Level of anxiety will decrease Outcome: Progressing   Problem: Elimination: Goal: Will not experience complications related to bowel motility Outcome: Progressing Goal: Will not experience complications related to urinary retention Outcome: Progressing   Problem: Pain Management: Goal: General experience of comfort will improve Outcome: Progressing   Problem: Safety: Goal: Ability to remain free from injury will improve Outcome: Progressing   Problem: Skin Integrity: Goal: Risk for  impaired skin integrity will decrease Outcome: Progressing

## 2023-06-09 NOTE — Progress Notes (Addendum)
PROGRESS NOTE    Alan Tapia.  ZOX:[id number] DOB: 12-09-1975 DOA: 04/06/2023 PCP: Patient, No Pcp Per     Brief Narrative:  Alan Tapia. Is a 48 year old male with past medical history significant for left lower extremity BKA, dilated cardiomyopathy, history of combined systolic and diastolic heart failure with EF 35 to 40%, hypertension, insulin-dependent diabetes, peripheral neuropathy, hyperlipidemia who presented to the emergency department on 04/06/2023 with complaints of odor, wound of his foot. He was also having nausea, vomiting and diarrhea. Orthopedic surgery was consulted and patient underwent right BKA 11/27 by Dr. Lajoyce Corners.  Hospitalization prolonged due to difficult to place status.  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 Blood cultures with no growth.   Right foot osteomyelitis s/p right BKA Patient was initially treated empirically with antibiotics. Orthopedic surgery performed a right BKA on 11/27. Antibiotics discontinued.   CKD stage II Stable.   Chronic combined systolic and diastolic heart failure Stable. -Continue Toprol XL and Entresto   Primary  hypertension -Continue Toprol XL and Entresto, norvasc    Diarrhea Resolved.   Abdominal edema Mostly to left side of abdomen with resultant fluid filled striae and mild dermatitis. Patient declining diuresis at this time. -Change positioning in bed to offload fluid from left side of abdomen   Hemorrhoids Treated with Anusol cream   Community acquired pneumonia Patient treated with Zosyn and doxycycline. Resolved.   Acute on chronic anemia Patient with hemoglobin drop down to 5.9 g/dL secondary to right foot wound, requiring 3 units of PRBC on 11/26. Patient requiring another 1 unit of PRBC on 11/30. Stable.   COVID-19 infection Patient completed isolation protocol. Patient treated with Paxlovid.   Constipation -Continue bowel regimen    In agreement with assessment of the pressure ulcer as below:  Pressure Injury 04/13/23 Perineum Bilateral Stage 2 -  Partial thickness loss of dermis presenting as a shallow open injury with a red, pink wound bed without slough. perineum/scrotum masd on admission (Active)  04/13/23 1100  Location: Perineum  Location Orientation: Bilateral  Staging: Stage 2 -  Partial thickness loss of dermis presenting as a shallow open injury with a red, pink wound bed without slough.  Wound Description (Comments): perineum/scrotum masd on admission  Present on Admission:   Dressing Type None 06/08/23 2000     Nutrition Problem: Moderate Malnutrition Etiology: chronic illness   DVT prophylaxis: Lovenox  Code Status: Full code Family Communication:  Disposition Plan: SNF Status is: Inpatient Remains inpatient appropriate because: SNF placement pending    Antimicrobials:  Anti-infectives (From admission, onward)    Start     Dose/Rate  Route Frequency Ordered Stop   04/30/23 1800  nirmatrelvir/ritonavir (PAXLOVID) 3 tablet        3 tablet Oral 2 times daily 04/30/23 1621 05/05/23 0820   04/09/23 1345  doxycycline (VIBRAMYCIN) 100 mg in dextrose  5 % 250 mL IVPB        100 mg 125 mL/hr over 120 Minutes Intravenous Every 12 hours 04/09/23 1252 04/09/23 1746   04/08/23 1300  levofloxacin (LEVAQUIN) IVPB 500 mg  Status:  Discontinued        500 mg 100 mL/hr over 60 Minutes Intravenous On call to O.R. 04/08/23 1229 04/08/23 1648   04/08/23 1300  vancomycin (VANCOREADY) IVPB 1500 mg/300 mL        1,500 mg 100 mL/hr over 180 Minutes Intravenous On call to O.R. 04/08/23 1229 04/08/23 1433   04/08/23 1237  vancomycin HCl (VANCOREADY) 1500 MG/300ML IVPB       Note to Pharmacy: [name] D: cabinet override      04/08/23 1237 04/08/23 1439   04/07/23 0800  piperacillin-tazobactam (ZOSYN) IVPB 3.375 g        3.375 g 12.5 mL/hr over 240 Minutes Intravenous Every 8 hours 04/07/23 0108 04/09/23 2108   04/07/23 0100  doxycycline (VIBRAMYCIN) 100 mg in dextrose 5 % 250 mL IVPB  Status:  Discontinued        100 mg 125 mL/hr over 120 Minutes Intravenous Every 12 hours 04/07/23 0057 04/09/23 1252   04/07/23 0000  ceFEPIme (MAXIPIME) 2 g in sodium chloride 0.9 % 100 mL IVPB  Status:  Discontinued        2 g 200 mL/hr over 30 Minutes Intravenous Every 8 hours 04/06/23 2349 04/07/23 0108   04/06/23 2330  metroNIDAZOLE (FLAGYL) IVPB 500 mg        500 mg 100 mL/hr over 60 Minutes Intravenous  Once 04/06/23 2321 04/07/23 0032   04/06/23 2300  piperacillin-tazobactam (ZOSYN) IVPB 3.375 g  Status:  Discontinued        3.375 g 100 mL/hr over 30 Minutes Intravenous  Once 04/06/23 2257 04/06/23 2320   04/06/23 2300  vancomycin (VANCOREADY) IVPB 2000 mg/400 mL  Status:  Discontinued        2,000 mg 200 mL/hr over 120 Minutes Intravenous  Once 04/06/23 2257 04/06/23 2312        Objective: Vitals:   06/08/23 1605 06/08/23 2144 06/09/23 0436 06/09/23 1049  BP: 134/75 (!) 156/91 (!) 161/86 (!) 161/68  Pulse: 72 90 87 87  Resp: 16 18 17    Temp: 98 F (36.7 C) 98.2 F (36.8 C) 98.4 F (36.9 C)   TempSrc: Oral Oral Oral   SpO2: 98% 97% 98%    Weight:      Height:       No intake or output data in the 24 hours ending 06/09/23 1458   Filed Weights   06/01/23 0445 06/02/23 0430 06/03/23 0500  Weight: 132 kg 133.1 kg 129.8 kg     Data Reviewed: I have personally reviewed following labs and imaging studies  CBC: Recent Labs  Lab 06/03/23 0626  WBC 4.7  HGB 10.1*  HCT 33.7*  MCV 86.0  PLT 296   Basic Metabolic Panel: Recent Labs  Lab 06/03/23 0626  NA 134*  K 4.2  CL 106  CO2 23  GLUCOSE 187*  BUN 7  CREATININE 0.96  CALCIUM 7.2*   GFR: Estimated Creatinine Clearance: 138.4 mL/min (by C-G formula based on SCr of 0.96 mg/dL). Liver Function Tests:  No results for input(s): "AST", "ALT", "ALKPHOS", "BILITOT", "PROT", "ALBUMIN" in the last 168 hours. No results for input(s): "LIPASE", "AMYLASE" in the last 168 hours. No results for input(s): "AMMONIA" in the last 168 hours. Coagulation Profile: No results for input(s): "INR", "PROTIME" in the last 168 hours. Cardiac Enzymes: No results for input(s): "CKTOTAL", "CKMB", "CKMBINDEX", "TROPONINI" in the last 168 hours. BNP (last 3 results) No results for input(s): "PROBNP" in the last 8760 hours. HbA1C: No results for input(s): "HGBA1C" in the last 72 hours. CBG: No results for input(s): "GLUCAP" in the last 168 hours. Lipid Profile: No results for input(s): "CHOL", "HDL", "LDLCALC", "TRIG", "CHOLHDL", "LDLDIRECT" in the last 72 hours. Thyroid Function Tests: No results for input(s): "TSH", "T4TOTAL", "FREET4", "T3FREE", "THYROIDAB" in the last 72 hours. Anemia Panel: No results for input(s): "VITAMINB12", "FOLATE", "FERRITIN", "TIBC", "IRON", "RETICCTPCT" in the last 72 hours. Sepsis Labs: No results for input(s): "PROCALCITON", "LATICACIDVEN" in the last 168 hours.  No results found for this or any previous visit (from the past 240 hours).    Radiology Studies: No results found.    Scheduled Meds:  docusate sodium  100 mg Oral BID   [START ON  06/10/2023] enoxaparin (LOVENOX) injection  60 mg Subcutaneous Daily   feeding supplement  237 mL Oral BID BM   Gerhardt's butt cream   Topical BID   lidocaine  2 patch Transdermal Q24H   melatonin  5 mg Oral QHS   metoprolol succinate  200 mg Oral Daily   pantoprazole  40 mg Oral BID   polyethylene glycol  17 g Oral BID   saccharomyces boulardii  250 mg Oral BID   sacubitril-valsartan  1 tablet Oral BID   Continuous Infusions:   LOS: 63 days    Noralee Stain, DO [facility] 06/09/2023, 2:58 PM   Available via Epic secure chat 7am-7pm After these hours, please refer to coverage provider listed on [email or web address]

## 2023-06-09 NOTE — Progress Notes (Signed)
PT Cancellation Note  Patient Details Name: Alan Tapia. MRN: [id number] DOB: August 05, 1975   Cancelled Treatment:    Reason Eval/Treat Not Completed: Other (comment) Pt spouse stating pt took laxative and is having frequent bowel movements; asking if PT could hold off for today.  Lillia Pauls, PT, DPT Acute Rehabilitation Services Office [phone]    Alan Tapia 06/09/2023, 4:14 PM

## 2023-06-09 NOTE — Progress Notes (Signed)
Passed scheduled meds and prn meds for pain and itching for primary nurse, Charlett Blake, RN. Pt's pain was 10 on pain scale 0-10.

## 2023-06-10 DIAGNOSIS — Z89511 Acquired absence of right leg below knee: Secondary | ICD-10-CM | POA: Diagnosis not present

## 2023-06-10 DIAGNOSIS — R6520 Severe sepsis without septic shock: Secondary | ICD-10-CM | POA: Diagnosis not present

## 2023-06-10 DIAGNOSIS — A419 Sepsis, unspecified organism: Secondary | ICD-10-CM | POA: Diagnosis not present

## 2023-06-10 DIAGNOSIS — I5042 Chronic combined systolic (congestive) and diastolic (congestive) heart failure: Secondary | ICD-10-CM | POA: Diagnosis not present

## 2023-06-10 NOTE — Progress Notes (Signed)
PROGRESS NOTE    Alan Tapia.  WUJ:[id number] DOB: 09-22-1975 DOA: 04/06/2023 PCP: Patient, No Pcp Per   Brief Narrative: 48 year old male with past medical history significant for left lower extremity BKA, dilated cardiomyopathy, history of combined systolic and diastolic heart failure with EF 35 to 40%, hypertension, insulin-dependent diabetes, peripheral neuropathy, hyperlipidemia who presented to the emergency department on 04/06/2023 with complaints of odor, wound of his foot. He was also having nausea, vomiting and diarrhea. Orthopedic surgery was consulted and patient underwent right BKA 11/27 by Dr. Lajoyce Corners.   Assessment and Plan:  Sepsis Present on admission, secondary to right foot osteomyelitis. Blood cultures with no growth.  Right foot osteomyelitis s/p right BKA Patient was initially treated empirically with antibiotics. Orthopedic surgery performed a right BKA on 11/27. Antibiotics discontinued.  CKD stage II Stable.  Chronic combined systolic and diastolic heart failure Stable. -Continue Toprol XL and Entresto  Primary hypertension -Continue Toprol XL and Entresto  Diarrhea Initially resolved, now recurrent. Possibly related to protein supplement as patient is lactose intolerant.  Abdominal edema Mostly to left side of abdomen with resultant fluid filled striae and mild dermatitis. Patient declining diuresis at this time. -Change positioning in bed to offload fluid from left side of abdomen  Hemorrhoids Anusol cream  Community acquired pneumonia Patient treated with Zosyn and doxycycline. Resolved.  Acute on chronic anemia Patient with hemoglobin drop down to 5.9 g/dL secondary to right foot wound, requiring 3 units of PRBC on 11/26. Patient requiring another 1 unit of PRBC on 11/30. Stable.  COVID-19 infection Patient completed isolation protocol. Patient treated with Paxlovid.  Constipation -Continue bowel regimen  Pressure injury Bilateral  perineum, unclear if present on admission.   DVT prophylaxis: Lovenox Code Status:   Code Status: Full Code Family Communication: None at bedside Disposition Plan: Discharge home in 2 days per [facility]   Consultants:  Orthopedic surgery  Procedures:  Right below knee amputation  Antimicrobials: Paxlovid Vancomycin Zosyn Flagyl Doxycycline Cefepime   Subjective: Diarrhea. No other concerns.  Objective: BP 128/83 (BP Location: Left Arm)   Pulse 83   Temp 98.2 F (36.8 C) (Oral)   Resp 16   Ht 6\' 4"  (1.93 m)   Wt 133.8 kg   SpO2 99%   BMI 35.91 kg/m   Examination:  General exam: Appears calm and comfortable Respiratory system: Clear to auscultation. Respiratory effort normal. Gastrointestinal system: Abdomen is soft and nontender. Central nervous system: Alert and oriented. No focal neurological deficits. Musculoskeletal: Bilateral BKA Psychiatry: Judgement and insight appear normal. Mood & affect appropriate.    Data Reviewed: I have personally reviewed following labs and imaging studies  CBC Lab Results  Component Value Date   WBC 4.7 06/03/2023   RBC 3.92 (L) 06/03/2023   HGB 10.1 (L) 06/03/2023   HCT 33.7 (L) 06/03/2023   MCV 86.0 06/03/2023   MCH 25.8 (L) 06/03/2023   PLT 296 06/03/2023   MCHC 30.0 06/03/2023   RDW 15.1 06/03/2023   LYMPHSABS 1.9 05/10/2023   MONOABS 0.5 05/10/2023   EOSABS 0.1 05/10/2023   BASOSABS 0.1 05/10/2023     Last metabolic panel Lab Results  Component Value Date   NA 134 (L) 06/03/2023   K 4.2 06/03/2023   CL 106 06/03/2023   CO2 23 06/03/2023   BUN 7 06/03/2023   CREATININE 0.96 06/03/2023   GLUCOSE 187 (H) 06/03/2023   GFRNONAA >60 06/03/2023   GFRAA >60 09/20/2016   CALCIUM 7.2 (L) 06/03/2023  PROT 5.3 (L) 05/20/2023   ALBUMIN <1.5 (L) 05/20/2023   BILITOT 0.5 05/20/2023   ALKPHOS 182 (H) 05/20/2023   AST 12 (L) 05/20/2023   ALT 12 05/20/2023   ANIONGAP 5 06/03/2023    GFR: Estimated Creatinine  Clearance: 140.6 mL/min (by C-G formula based on SCr of 0.96 mg/dL).  No results found for this or any previous visit (from the past 240 hours).    Radiology Studies: No results found.     LOS: 64 days    Jacquelin Hawking, MD [facility] 06/10/2023, 5:44 PM   If 7PM-7AM, please contact night-coverage [email or web address]

## 2023-06-10 NOTE — Progress Notes (Signed)
Patient is 99% on room air. Patient is unable to ambulate due to bilateral BKA's    06/10/23 1114  Vitals  Pulse Rate 88  Pulse Rate Source Dinamap  Resp 17  MEWS COLOR  MEWS Score Color Green  Oxygen Therapy  SpO2 99 %  O2 Device Room Air  Patient Activity (if Appropriate) In bed  MEWS Score  MEWS Temp 0  MEWS Systolic 0  MEWS Pulse 0  MEWS RR 0  MEWS LOC 0  MEWS Score 0

## 2023-06-10 NOTE — TOC Progression Note (Signed)
Transition of Care (TOC) - Progression Note   See SW note . NCM met with patient, wife and DSS and SW.   Plan for wife to follow up on apartment today at [address] , [place].   Cindie with [facility] aware of address.   Hoyer lift ordered with Mitch with [employer]. Patient has hospital bed, wheelchair and oxygen at old address. Patient and wife are responsible for having DME moved to new address. Patient and wife aware of same.   [employer] had been leaving messages for [facility] to call back, concerning his oxygen. Patient due to be requaified for home oxygen. Patient has not been using oxygen in hospital. [employer] asking for saturation note at rest . Nurse completed note . Patient 99% on room air at rest, he no longer qualifies for home oxygen . [employer] will pick up home oxygen.   Ordered hoyer lift with Mitch with [employer]. [employer] will wait to be sure [name] has secure new apartment and then arrange delivery.   Patient will require ambulance transport home at discharge  Patient Details  Name: Alan Tapia. MRN: [id number] Date of Birth: Jan 10, 1976  Transition of Care [facility]) CM/SW Contact  Laquinn Shippy, Adria Devon, RN Phone Number: 06/10/2023, 4:05 PM  Clinical Narrative:       Expected Discharge Plan: Skilled Nursing Facility Barriers to Discharge: Insurance Authorization, Continued Medical Work up, SNF Pending bed offer  Expected Discharge Plan and Services In-house Referral: Clinical Social Work     Living arrangements for the past 2 months: Single Family Home Expected Discharge Date: 04/11/23                                     Social Determinants of Health (SDOH) Interventions SDOH Screenings   Food Insecurity: No Food Insecurity (04/07/2023)  Housing: Unknown (04/23/2023)  Recent Concern: Housing - Medium Risk (04/07/2023)  Transportation Needs: No Transportation Needs (04/07/2023)  Recent Concern: Transportation Needs - Unmet Transportation Needs  (02/17/2023)   Received from [facility]  Utilities: At Risk (04/07/2023)  Alcohol Screen: Low Risk  (08/20/2022)  Financial Resource Strain: Patient Unable To Answer (08/20/2022)  Tobacco Use: Low Risk  (04/08/2023)    Readmission Risk Interventions    04/07/2023    2:34 PM  Readmission Risk Prevention Plan  Transportation Screening Complete  PCP or Specialist Appt within 3-5 Days Complete  HRI or Home Care Consult Complete  Social Work Consult for Recovery Care Planning/Counseling Complete  Palliative Care Screening Not Applicable  Medication Review Oceanographer) Complete

## 2023-06-10 NOTE — TOC Progression Note (Signed)
Transition of Care (TOC) - Progression Note    Patient Details  Name: Alan Tapia. MRN: [id number] Date of Birth: November 14, 1975  Transition of Care [facility]) CM/SW Contact  Carley Hammed, LCSW Phone Number: 06/10/2023, 3:06 PM  Clinical Narrative:    CSW met with pt, spouse, CM, DSS worker Hale Bogus, and DSS supervisor. Pt and spouse were advised that paperwork had not been completed for disability after several extensions, and likely the disability claim would be denied. They were reminded that this is what was needed to proceed with SNF. DSS worker noted they had spoken with the apartment manager and was advised that a handicap accessible unit was available, but pt and spouse had not responded to their request for documentation. Pt and spouse still owe a balance, which will need to be paid along with a deposit on the new apartment. Spouse will find out how much the amount is and ask pt's mother for funds.  Hale Bogus advised pt they needed to act on this today, as the hospital has been holding the pt for too long without family and pt working on placement. DSS also advising that pt and spouse are responsible for making the necessary arrangements to DC home. Pt stating that he was never told any of this, that spouse never asked him to fill anything out. Spouse stating pt refused to fill out paperwork and said "this isn't for a sick person to do".  CM advised that equipment can be ordered and delivered or moved to the new apartment as soon as it is secured. CM also advised that 2-3 HH visits can be arranged, but pt will need to do it OP after. Pt has Medicaid medical transportation. Spouse states she will be the primary caregiver. Team gave the family a deadline of Friday for possible discharge. TOC continues to follow.   Expected Discharge Plan: Skilled Nursing Facility Barriers to Discharge: Insurance Authorization, Continued Medical Work up, SNF Pending bed offer  Expected Discharge Plan and  Services In-house Referral: Clinical Social Work     Living arrangements for the past 2 months: Single Family Home Expected Discharge Date: 04/11/23                                     Social Determinants of Health (SDOH) Interventions SDOH Screenings   Food Insecurity: No Food Insecurity (04/07/2023)  Housing: Unknown (04/23/2023)  Recent Concern: Housing - Medium Risk (04/07/2023)  Transportation Needs: No Transportation Needs (04/07/2023)  Recent Concern: Transportation Needs - Unmet Transportation Needs (02/17/2023)   Received from [facility]  Utilities: At Risk (04/07/2023)  Alcohol Screen: Low Risk  (08/20/2022)  Financial Resource Strain: Patient Unable To Answer (08/20/2022)  Tobacco Use: Low Risk  (04/08/2023)    Readmission Risk Interventions    04/07/2023    2:34 PM  Readmission Risk Prevention Plan  Transportation Screening Complete  PCP or Specialist Appt within 3-5 Days Complete  HRI or Home Care Consult Complete  Social Work Consult for Recovery Care Planning/Counseling Complete  Palliative Care Screening Not Applicable  Medication Review Oceanographer) Complete

## 2023-06-10 NOTE — Progress Notes (Signed)
PT Cancellation Note  Patient Details Name: Alan Tapia. MRN: [id number] DOB: [birth year]   Cancelled Treatment:    Reason Eval/Treat Not Completed: (P) Pain limiting ability to participate (pt refusing, states he had pain meds "hours ago", PTA had prearranged PT session time with nursing staff so pt could work on OOB and wheelchair mobility. After pt refusal, she reports premedication 2 hours prior and lidocaine patches placed ~15 mins prior, pt not due for more pain medications until 5:22 pm.) This would be 3 hours after attempt time. Pt reports pain meds do not last 2 hours even and his pain is 18/10, and that he is in too much pain to roll for pressure relief or to allow staff to assist him with hygiene (unclear whether pt needs hygiene assist or not, based on discussion, nursing staff notified to check with him at end of attempt, NT entering pt room at that time). RN notified of pt complaints. >10 mins spent in discussion of benefits of mobility and alternatives offered (rolling for pressure relief or repositioning for comfort, supine exercise, etc) but pt defers.   Dorathy Kinsman Shiraz Bastyr 06/10/2023, 5:12 PM

## 2023-06-11 DIAGNOSIS — Z89511 Acquired absence of right leg below knee: Secondary | ICD-10-CM | POA: Diagnosis not present

## 2023-06-11 DIAGNOSIS — A419 Sepsis, unspecified organism: Secondary | ICD-10-CM | POA: Diagnosis not present

## 2023-06-11 DIAGNOSIS — R6520 Severe sepsis without septic shock: Secondary | ICD-10-CM | POA: Diagnosis not present

## 2023-06-11 DIAGNOSIS — I5042 Chronic combined systolic (congestive) and diastolic (congestive) heart failure: Secondary | ICD-10-CM | POA: Diagnosis not present

## 2023-06-11 MED ORDER — ENSURE ENLIVE PO LIQD
237.0000 mL | Freq: Two times a day (BID) | ORAL | Status: DC
  Administered 2023-06-12 – 2023-06-29 (×32): 237 mL via ORAL

## 2023-06-11 MED ORDER — PROPOFOL 1000 MG/100ML IV EMUL
INTRAVENOUS | Status: AC
  Filled 2023-06-11: qty 300

## 2023-06-11 NOTE — Progress Notes (Signed)
Physical Therapy Treatment Patient Details Name: Alan Tapia. MRN: [id number] DOB: [birth year] Today's Date: 06/11/2023   History of Present Illness The pt is a 48 yo male presenting 11/25 with nausea, vomiting, and fever as well as R foot wound. 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    PT Comments  Pt in bed upon arrival and agreeable to PT session. Worked on AP transfers and bed mobility in today's session. With increased time, pt was able to move from supine/sit with CGA and bed rails. Pt required ModA to roll bilaterally and assist to don/doff shorts. Pt was able to perform AP transfer to/from [facility] with use of maxislide. Initially, pt was CGA with cues to perform weight shifts by leaning onto one elbow and scooting opposite hip posteriorly. Pt began to fatigue and required ModAx2 for safety with use of maxislide to complete posterior scoot. Pt/spouse have access to handicapped accessible housing with plan to DC home w/ physical assist available for ADLs and mobility. Pt is progressing slowly towards goals. Acute PT to follow.      If plan is discharge home, recommend the following: Two people to help with walking and/or transfers;Assist for transportation;Assistance with cooking/housework;Help with stairs or ramp for entrance   Can travel by private vehicle     No  Equipment Recommendations  Hoyer lift;Wheelchair cushion (measurements PT);Wheelchair (measurements PT);Hospital bed       Precautions / Restrictions Precautions Precautions: Fall Restrictions Weight Bearing Restrictions Per Provider Order: No     Mobility  Bed Mobility Overal bed mobility: Needs Assistance Bed Mobility: Rolling, Supine to Sit, Sit to Supine Rolling: Mod assist, Used rails   Supine to sit: Contact guard Sit to supine: Contact guard  assist   General bed mobility comments: CGA for safety to come to EOB multiple times during session. Mod A to roll side to side to remove maxislide and pants    Transfers Overall transfer level: Needs assistance Equipment used:  (Maxislide) Transfers: Bed to chair/wheelchair/BSC    Anterior-Posterior transfers: Mod assist, +2 safety/equipment, +2 physical assistance   General transfer comment: Mod A x 2 for AP transfer to/from wheelchair with use of maxislide. Cues for technique to offload each hip. CGA for scooting until pt moved closer to gap. ModAx2 to scoot over gap from bed <> chair and use of maxislide to scoot back up in wheelchair due to posterior tilt of pelvis in chair      Balance Overall balance assessment: Needs assistance Sitting-balance support: Single extremity supported, Feet unsupported Sitting balance-Leahy Scale: Fair         Cognition Arousal: Alert Behavior During Therapy: WFL for tasks assessed/performed Overall Cognitive Status: Within Functional Limits for tasks assessed    General Comments: initially a bit agitated when he felt rushed by therapist. cues for safety and problem solving tasks intermittently        Exercises General Exercises - Upper Extremity Chair Push Up: AROM, Both, 5 reps, Seated (x3 sec, unable to lift bottom)    General Comments General comments (skin integrity, edema, etc.): VSS on RA      Pertinent Vitals/Pain Pain Assessment Pain Assessment: No/denies pain     PT Goals (current goals can now be found in the care plan section) Acute Rehab PT Goals PT Goal Formulation: With patient Time For  Goal Achievement: 06/25/23 Potential to Achieve Goals: Fair Progress towards PT goals: Progressing toward goals    Frequency    Min 1X/week           Co-evaluation   Reason for Co-Treatment: Complexity of the patient's impairments (multi-system involvement);Necessary to address cognition/behavior during functional  activity;For patient/therapist safety PT goals addressed during session: Mobility/safety with mobility;Balance OT goals addressed during session: ADL's and self-care;Strengthening/ROM      AM-PAC PT "6 Clicks" Mobility   Outcome Measure  Help needed turning from your back to your side while in a flat bed without using bedrails?: A Lot Help needed moving from lying on your back to sitting on the side of a flat bed without using bedrails?: A Little Help needed moving to and from a bed to a chair (including a wheelchair)?: Total Help needed standing up from a chair using your arms (e.g., wheelchair or bedside chair)?: Total Help needed to walk in hospital room?: Total Help needed climbing 3-5 steps with a railing? : Total 6 Click Score: 9    End of Session Equipment Utilized During Treatment: Gait belt (maxislide) Activity Tolerance: Patient tolerated treatment well Patient left: in bed;with call bell/phone within reach Nurse Communication: Mobility status PT Visit Diagnosis: Other abnormalities of gait and mobility (R26.89);Muscle weakness (generalized) (M62.81)     Time: 1914-7829 PT Time Calculation (min) (ACUTE ONLY): 53 min  Charges:    $Therapeutic Activity: 8-22 mins PT General Charges $$ ACUTE PT VISIT: 1 Visit                     Hilton Cork, PT, DPT Secure Chat Preferred  Rehab Office [phone]   Arturo Morton Brion Aliment 06/11/2023, 3:51 PM

## 2023-06-11 NOTE — Progress Notes (Signed)
Occupational Therapy Treatment Patient Details Name: Alan Tapia. MRN: [id number] DOB: 1975/08/03 Today's Date: 06/11/2023   History of present illness The pt is a 48 yo male presenting 11/25 with nausea, vomiting, and fever as well as R foot wound. 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   OT comments  Pt with slower progress towards OT goals due to continued deficits in overall strength and endurance. Emphasis on continued problem solving for safe AP transfers to minimize pt's discomfort in peri region and decrease fall risk. With use of Maxislide, pt able to demo AP transfer to/from wheelchair with Mod A x 2 though unable to work on wheelchair mobility d/t pelvic posterior bias in wheelchair (footrests may minimize some sliding-will attempt next time). Pt w/ increased difficulty reaching B residual limbs to assist with LB dressing today requiring Mod A. Noted pt/spouse have access to handicapped accessible housing now with plan to DC home w/ continued spousal assist for ADLs/transfers.       If plan is discharge home, recommend the following:  A lot of help with walking and/or transfers;A lot of help with bathing/dressing/bathroom   Equipment Recommendations  Hoyer lift;Other (comment) (Maxislide)    Recommendations for Other Services      Precautions / Restrictions Precautions Precautions: Fall Restrictions Weight Bearing Restrictions Per Provider Order: No       Mobility Bed Mobility Overal bed mobility: Needs Assistance Bed Mobility: Rolling, Supine to Sit, Sit to Supine Rolling: Mod assist, Used rails   Supine to sit: Contact guard Sit to supine: Contact guard assist   General bed mobility comments: CGA for safety to come to EOB multiple times during session. Mod A to roll side to side to remove  maxislide and pants    Transfers Overall transfer level: Needs assistance Equipment used:  (Maxislide) Transfers: Bed to chair/wheelchair/BSC         Anterior-Posterior transfers: Mod assist, +2 safety/equipment, +2 physical assistance   General transfer comment: Mod A x 2 for AP transfer to/from wheelchair with use of maxislide. Cues for technique to offload each hip to allow easier scooting and less friction. assist to scoot over gap from bed <> chair and use of maxislide to scoot back up in wheelchair due to posterior tilt of pelvis in chair     Balance Overall balance assessment: Needs assistance Sitting-balance support: Single extremity supported, Feet unsupported Sitting balance-Leahy Scale: Fair                                     ADL either performed or assessed with clinical judgement   ADL Overall ADL's : Needs assistance/impaired             Lower Body Bathing: Minimal assistance;Bed level Lower Body Bathing Details (indicate cue type and reason): able to bathe posterior region x 2 bed level during session. unable to reach end of residual limbs today so will need assist with this     Lower Body Dressing: Moderate assistance;Bed level Lower Body Dressing Details (indicate cue type and reason): pt reports unable to reach either residual limb today (though had previously worked on and demonstrated with this OT in a prior session). assist to don shorts around B residual limbs and intermittent assist  to don over waist while pt laterally leaning EOB               General ADL Comments: Emphasis on AP transfer to/from wheelchair with maxislide    Extremity/Trunk Assessment Upper Extremity Assessment Upper Extremity Assessment: Overall WFL for tasks assessed;Right hand dominant   Lower Extremity Assessment Lower Extremity Assessment: Defer to PT evaluation        Vision   Vision Assessment?: No apparent visual deficits   Perception      Praxis      Cognition Arousal: Alert Behavior During Therapy: WFL for tasks assessed/performed Overall Cognitive Status: Within Functional Limits for tasks assessed                                 General Comments: initially a bit agitated when he felt rushed by therapist. cues for safety and problem solving tasks intermittently        Exercises      Shoulder Instructions       General Comments      Pertinent Vitals/ Pain       Pain Assessment Pain Assessment: No/denies pain  Home Living                                          Prior Functioning/Environment              Frequency  Min 1X/week        Progress Toward Goals  OT Goals(current goals can now be found in the care plan section)  Progress towards OT goals: OT to reassess next treatment  Acute Rehab OT Goals Patient Stated Goal: improve transfers, get prosthetic LE OT Goal Formulation: With patient Time For Goal Achievement: 06/25/23 Potential to Achieve Goals: Good ADL Goals Pt Will Perform Lower Body Dressing: with min assist;sitting/lateral leans;bed level Pt Will Transfer to Toilet: with mod assist;with +2 assist;anterior/posterior transfer;bedside commode Pt Will Perform Toileting - Clothing Manipulation and hygiene: with min assist;sitting/lateral leans;bed level Pt/caregiver will Perform Home Exercise Program: Increased strength;Both right and left upper extremity;Independently;With written HEP provided Additional ADL Goal #1: Pt to don B shrinkers Independently Additional ADL Goal #2: Caregiver to demonstrate safest way to assist pt with ADLs/transfers to decrease fall risk at home  Plan      Co-evaluation    PT/OT/SLP Co-Evaluation/Treatment: Yes Reason for Co-Treatment: Complexity of the patient's impairments (multi-system involvement);Necessary to address cognition/behavior during functional activity;For patient/therapist safety PT goals addressed  during session: Mobility/safety with mobility;Balance OT goals addressed during session: ADL's and self-care;Strengthening/ROM      AM-PAC OT "6 Clicks" Daily Activity     Outcome Measure   Help from another person eating meals?: A Little Help from another person taking care of personal grooming?: A Little Help from another person toileting, which includes using toliet, bedpan, or urinal?: A Lot Help from another person bathing (including washing, rinsing, drying)?: A Lot Help from another person to put on and taking off regular upper body clothing?: A Little Help from another person to put on and taking off regular lower body clothing?: A Lot 6 Click Score: 15    End of Session Equipment Utilized During Treatment: Gait belt  OT Visit Diagnosis: Other abnormalities of gait and mobility (R26.89);Muscle weakness (generalized) (M62.81);Other (comment)   Activity Tolerance Patient tolerated treatment well  Patient Left in bed;with call bell/phone within reach   Nurse Communication Mobility status        Time: 6578-4696 OT Time Calculation (min): 54 min  Charges: OT General Charges $OT Visit: 1 Visit OT Treatments $Self Care/Home Management : 8-22 mins $Therapeutic Activity: 8-22 mins  Bradd Canary, OTR/L Acute Rehab Services Office: [phone]   Lorre Munroe 06/11/2023, 3:19 PM

## 2023-06-11 NOTE — Plan of Care (Signed)
  Problem: Fluid Volume: Goal: Hemodynamic stability will improve Outcome: Progressing   Problem: Clinical Measurements: Goal: Signs and symptoms of infection will decrease Outcome: Progressing   Problem: Respiratory: Goal: Ability to maintain adequate ventilation will improve Outcome: Progressing   Problem: Education: Goal: Knowledge of General Education information will improve Description: Including pain rating scale, medication(s)/side effects and non-pharmacologic comfort measures Outcome: Progressing   Problem: Health Behavior/Discharge Planning: Goal: Ability to manage health-related needs will improve Outcome: Progressing   Problem: Elimination: Goal: Will not experience complications related to bowel motility Outcome: Progressing   Problem: Pain Management: Goal: General experience of comfort will improve Outcome: Progressing   Problem: Safety: Goal: Ability to remain free from injury will improve Outcome: Progressing   Problem: Skin Integrity: Goal: Risk for impaired skin integrity will decrease Outcome: Progressing

## 2023-06-11 NOTE — Plan of Care (Signed)
Problem: Fluid Volume: Goal: Hemodynamic stability will improve Outcome: Progressing   Problem: Clinical Measurements: Goal: Diagnostic test results will improve Outcome: Progressing Goal: Signs and symptoms of infection will decrease Outcome: Progressing   Problem: Respiratory: Goal: Ability to maintain adequate ventilation will improve Outcome: Progressing   Problem: Education: Goal: Knowledge of General Education information will improve Description: Including pain rating scale, medication(s)/side effects and non-pharmacologic comfort measures Outcome: Progressing   Problem: Health Behavior/Discharge Planning: Goal: Ability to manage health-related needs will improve Outcome: Progressing   Problem: Clinical Measurements: Goal: Ability to maintain clinical measurements within normal limits will improve Outcome: Progressing Goal: Will remain free from infection Outcome: Progressing Goal: Diagnostic test results will improve Outcome: Progressing Goal: Respiratory complications will improve Outcome: Progressing Goal: Cardiovascular complication will be avoided Outcome: Progressing   Problem: Activity: Goal: Risk for activity intolerance will decrease Outcome: Progressing   Problem: Nutrition: Goal: Adequate nutrition will be maintained Outcome: Progressing   Problem: Coping: Goal: Level of anxiety will decrease Outcome: Progressing   Problem: Elimination: Goal: Will not experience complications related to bowel motility Outcome: Progressing Goal: Will not experience complications related to urinary retention Outcome: Progressing   Problem: Pain Management: Goal: General experience of comfort will improve Outcome: Progressing   Problem: Safety: Goal: Ability to remain free from injury will improve Outcome: Progressing   Problem: Skin Integrity: Goal: Risk for impaired skin integrity will decrease Outcome: Progressing   Problem: Activity: Goal: Ability  to tolerate increased activity will improve Outcome: Progressing   Problem: Clinical Measurements: Goal: Ability to maintain a body temperature in the normal range will improve Outcome: Progressing   Problem: Respiratory: Goal: Ability to maintain adequate ventilation will improve Outcome: Progressing Goal: Ability to maintain a clear airway will improve Outcome: Progressing   Problem: Education: Goal: Knowledge of the prescribed therapeutic regimen will improve Outcome: Progressing Goal: Ability to verbalize activity precautions or restrictions will improve Outcome: Progressing Goal: Understanding of discharge needs will improve Outcome: Progressing   Problem: Activity: Goal: Ability to perform//tolerate increased activity and mobilize with assistive devices will improve Outcome: Progressing   Problem: Clinical Measurements: Goal: Postoperative complications will be avoided or minimized Outcome: Progressing   Problem: Self-Care: Goal: Ability to meet self-care needs will improve Outcome: Progressing   Problem: Self-Concept: Goal: Ability to maintain and perform role responsibilities to the fullest extent possible will improve Outcome: Progressing   Problem: Education: Goal: Knowledge of General Education information will improve Description: Including pain rating scale, medication(s)/side effects and non-pharmacologic comfort measures Outcome: Progressing   Problem: Health Behavior/Discharge Planning: Goal: Ability to manage health-related needs will improve Outcome: Progressing   Problem: Clinical Measurements: Goal: Ability to maintain clinical measurements within normal limits will improve Outcome: Progressing Goal: Will remain free from infection Outcome: Progressing Goal: Diagnostic test results will improve Outcome: Progressing Goal: Respiratory complications will improve Outcome: Progressing Goal: Cardiovascular complication will be avoided Outcome:  Progressing   Problem: Activity: Goal: Risk for activity intolerance will decrease Outcome: Progressing   Problem: Nutrition: Goal: Adequate nutrition will be maintained Outcome: Progressing   Problem: Coping: Goal: Level of anxiety will decrease Outcome: Progressing   Problem: Elimination: Goal: Will not experience complications related to bowel motility Outcome: Progressing Goal: Will not experience complications related to urinary retention Outcome: Progressing   Problem: Pain Management: Goal: General experience of comfort will improve Outcome: Progressing   Problem: Safety: Goal: Ability to remain free from injury will improve Outcome: Progressing   Problem: Skin Integrity: Goal: Risk for  impaired skin integrity will decrease Outcome: Progressing

## 2023-06-11 NOTE — Progress Notes (Signed)
PROGRESS NOTE    Alan Tapia.  XLK:[id number] DOB: 1976/03/25 DOA: 04/06/2023 PCP: Patient, No Pcp Per   Brief Narrative: 48 year old male with past medical history significant for left lower extremity BKA, dilated cardiomyopathy, history of combined systolic and diastolic heart failure with EF 35 to 40%, hypertension, insulin-dependent diabetes, peripheral neuropathy, hyperlipidemia who presented to the emergency department on 04/06/2023 with complaints of odor, wound of his foot. He was also having nausea, vomiting and diarrhea. Orthopedic surgery was consulted and patient underwent right BKA 11/27 by Dr. Lajoyce Corners.   Assessment and Plan:  Sepsis Present on admission, secondary to right foot osteomyelitis. Blood cultures with no growth.  Right foot osteomyelitis s/p right BKA Patient was initially treated empirically with antibiotics. Orthopedic surgery performed a right BKA on 11/27. Antibiotics discontinued.  CKD stage II Stable.  Chronic combined systolic and diastolic heart failure Stable. -Continue Toprol XL and Entresto  Primary hypertension -Continue Toprol XL and Entresto  Diarrhea Initially resolved, now recurrent. Possibly related to diet as patient is lactose intolerant. Probiotic held.  Abdominal edema Mostly to left side of abdomen with resultant fluid filled striae and mild dermatitis. Patient declining diuresis at this time. -Change positioning in bed to offload fluid from left side of abdomen  Hemorrhoids Treated with Anusol cream.  Community acquired pneumonia Patient treated with Zosyn and doxycycline. Resolved.  Acute on chronic anemia Patient with hemoglobin drop down to 5.9 g/dL secondary to right foot wound, requiring 3 units of PRBC on 11/26. Patient requiring another 1 unit of PRBC on 11/30. Stable.  COVID-19 infection Patient completed isolation protocol. Patient treated with Paxlovid.  Constipation -Continue bowel regimen  Pressure  injury Bilateral perineum, unclear if present on admission.   DVT prophylaxis: Lovenox Code Status:   Code Status: Full Code Family Communication: None at bedside Disposition Plan: Discharge home in 2 days per [facility]   Consultants:  Orthopedic surgery  Procedures:  Right below knee amputation  Antimicrobials: Paxlovid Vancomycin Zosyn Flagyl Doxycycline Cefepime   Subjective: No specific concerns today.  Objective: BP 133/70 (BP Location: Left Arm)   Pulse 70   Temp 98.5 F (36.9 C)   Resp 16   Ht 6\' 4"  (1.93 m)   Wt 133.8 kg   SpO2 99%   BMI 35.91 kg/m   Examination:  General exam: Appears calm and comfortable Respiratory system: Respiratory effort normal. Gastrointestinal system: Abdomen is non-distended Central nervous system: Alert and oriented. Musculoskeletal: Bilateral BKA   Data Reviewed: I have personally reviewed following labs and imaging studies  CBC Lab Results  Component Value Date   WBC 4.7 06/03/2023   RBC 3.92 (L) 06/03/2023   HGB 10.1 (L) 06/03/2023   HCT 33.7 (L) 06/03/2023   MCV 86.0 06/03/2023   MCH 25.8 (L) 06/03/2023   PLT 296 06/03/2023   MCHC 30.0 06/03/2023   RDW 15.1 06/03/2023   LYMPHSABS 1.9 05/10/2023   MONOABS 0.5 05/10/2023   EOSABS 0.1 05/10/2023   BASOSABS 0.1 05/10/2023     Last metabolic panel Lab Results  Component Value Date   NA 134 (L) 06/03/2023   K 4.2 06/03/2023   CL 106 06/03/2023   CO2 23 06/03/2023   BUN 7 06/03/2023   CREATININE 0.96 06/03/2023   GLUCOSE 187 (H) 06/03/2023   GFRNONAA >60 06/03/2023   GFRAA >60 09/20/2016   CALCIUM 7.2 (L) 06/03/2023   PROT 5.3 (L) 05/20/2023   ALBUMIN <1.5 (L) 05/20/2023   BILITOT 0.5 05/20/2023   ALKPHOS  182 (H) 05/20/2023   AST 12 (L) 05/20/2023   ALT 12 05/20/2023   ANIONGAP 5 06/03/2023    GFR: Estimated Creatinine Clearance: 140.6 mL/min (by C-G formula based on SCr of 0.96 mg/dL).  No results found for this or any previous visit (from the  past 240 hours).    Radiology Studies: No results found.     LOS: 65 days    Jacquelin Hawking, MD [facility] 06/11/2023, 1:23 PM   If 7PM-7AM, please contact night-coverage [email or web address]

## 2023-06-11 NOTE — TOC Progression Note (Signed)
Transition of Care (TOC) - Progression Note    Patient Details  Name: Alan Tapia. MRN: [id number] Date of Birth: [birth year]  Transition of Care [facility]) CM/SW Contact  Carley Hammed, LCSW Phone Number: 06/11/2023, 4:31 PM  Clinical Narrative:    Per DSS worker Hale Bogus, spouse advised that She is working on getting the balance paid and the keys to the new apartment. [employer] told her they were also prepared to deliver equipment. TOC will continue to follow.    Expected Discharge Plan: Skilled Nursing Facility Barriers to Discharge: Insurance Authorization, Continued Medical Work up, SNF Pending bed offer  Expected Discharge Plan and Services In-house Referral: Clinical Social Work     Living arrangements for the past 2 months: Single Family Home Expected Discharge Date: 04/11/23                                     Social Determinants of Health (SDOH) Interventions SDOH Screenings   Food Insecurity: No Food Insecurity (04/07/2023)  Housing: Unknown (04/23/2023)  Recent Concern: Housing - Medium Risk (04/07/2023)  Transportation Needs: No Transportation Needs (04/07/2023)  Recent Concern: Transportation Needs - Unmet Transportation Needs (02/17/2023)   Received from [facility]  Utilities: At Risk (04/07/2023)  Alcohol Screen: Low Risk  (08/20/2022)  Financial Resource Strain: Patient Unable To Answer (08/20/2022)  Tobacco Use: Low Risk  (04/08/2023)    Readmission Risk Interventions    04/07/2023    2:34 PM  Readmission Risk Prevention Plan  Transportation Screening Complete  PCP or Specialist Appt within 3-5 Days Complete  HRI or Home Care Consult Complete  Social Work Consult for Recovery Care Planning/Counseling Complete  Palliative Care Screening Not Applicable  Medication Review Oceanographer) Complete

## 2023-06-12 ENCOUNTER — Inpatient Hospital Stay (HOSPITAL_COMMUNITY): Payer: Medicaid Other

## 2023-06-12 DIAGNOSIS — I5042 Chronic combined systolic (congestive) and diastolic (congestive) heart failure: Secondary | ICD-10-CM | POA: Diagnosis not present

## 2023-06-12 DIAGNOSIS — A419 Sepsis, unspecified organism: Secondary | ICD-10-CM | POA: Diagnosis not present

## 2023-06-12 DIAGNOSIS — Z89511 Acquired absence of right leg below knee: Secondary | ICD-10-CM | POA: Diagnosis not present

## 2023-06-12 DIAGNOSIS — R6520 Severe sepsis without septic shock: Secondary | ICD-10-CM | POA: Diagnosis not present

## 2023-06-12 MED ORDER — POLYETHYLENE GLYCOL 3350 17 G PO PACK
17.0000 g | PACK | Freq: Every day | ORAL | Status: DC | PRN
  Administered 2023-06-22: 17 g via ORAL
  Filled 2023-06-12 (×2): qty 1

## 2023-06-12 NOTE — Progress Notes (Signed)
PROGRESS NOTE    Alan Tapia.  ZOX:[id number] DOB: [birth year] DOA: 04/06/2023 PCP: Patient, No Pcp Per   Brief Narrative: 48 year old male with past medical history significant for left lower extremity BKA, dilated cardiomyopathy, history of combined systolic and diastolic heart failure with EF 35 to 40%, hypertension, insulin-dependent diabetes, peripheral neuropathy, hyperlipidemia who presented to the emergency department on 04/06/2023 with complaints of odor, wound of his foot. He was also having nausea, vomiting and diarrhea. Orthopedic surgery was consulted and patient underwent right BKA 11/27 by Dr. Lajoyce Corners.   Assessment and Plan:  Sepsis Present on admission, secondary to right foot osteomyelitis. Blood cultures with no growth.  Right foot osteomyelitis s/p right BKA Patient was initially treated empirically with antibiotics. Orthopedic surgery performed a right BKA on 11/27. Antibiotics discontinued.  CKD stage II Stable.  Chronic combined systolic and diastolic heart failure Stable. -Continue Toprol XL and Entresto  Primary hypertension -Continue Toprol XL and Entresto  Diarrhea Recurrent. Likely related to patient's aggressive bowel regimen.  -Discontinue scheduled bowel regimen  Abdominal edema Mostly to left side of abdomen with resultant fluid filled striae and mild dermatitis. Patient declining diuresis at this time. -Change positioning in bed to offload fluid from left side of abdomen  Hemorrhoids Treated with Anusol cream.  Community acquired pneumonia Patient treated with Zosyn and doxycycline. Resolved.  Acute on chronic anemia Patient with hemoglobin drop down to 5.9 g/dL secondary to right foot wound, requiring 3 units of PRBC on 11/26. Patient requiring another 1 unit of PRBC on 11/30. Stable.  COVID-19 infection Patient completed isolation protocol. Patient treated with Paxlovid.  Constipation Holding bowel regimen secondary to worsening  diarrhea.  Pressure injury Bilateral perineum, unclear if present on admission.   DVT prophylaxis: Lovenox Code Status:   Code Status: Full Code Family Communication: None at bedside Disposition Plan: Discharge home pending TOC ability to ensure safe discharge home   Consultants:  Orthopedic surgery  Procedures:  Right below knee amputation  Antimicrobials: Paxlovid Vancomycin Zosyn Flagyl Doxycycline Cefepime   Subjective: Diarrhea this morning. Abdominal pain which improved with a bowel movement.  Objective: BP 136/74 (BP Location: Left Arm)   Pulse 75   Temp 98 F (36.7 C) (Oral)   Resp 17   Ht 6\' 4"  (1.93 m)   Wt 133.8 kg   SpO2 99%   BMI 35.91 kg/m   Examination:  General exam: Appears calm and comfortable Respiratory system: Clear to auscultation. Respiratory effort normal. Cardiovascular system: S1 & S2 heard, RRR. No murmurs Gastrointestinal system: Abdomen is nondistended, soft and nontender. Normal bowel sounds heard. Central nervous system: Alert and oriented. No focal neurological deficits. Musculoskeletal: BLE BKA Psychiatry: Judgement and insight appear normal. Mood & affect appropriate.    Data Reviewed: I have personally reviewed following labs and imaging studies  CBC Lab Results  Component Value Date   WBC 4.7 06/03/2023   RBC 3.92 (L) 06/03/2023   HGB 10.1 (L) 06/03/2023   HCT 33.7 (L) 06/03/2023   MCV 86.0 06/03/2023   MCH 25.8 (L) 06/03/2023   PLT 296 06/03/2023   MCHC 30.0 06/03/2023   RDW 15.1 06/03/2023   LYMPHSABS 1.9 05/10/2023   MONOABS 0.5 05/10/2023   EOSABS 0.1 05/10/2023   BASOSABS 0.1 05/10/2023     Last metabolic panel Lab Results  Component Value Date   NA 134 (L) 06/03/2023   K 4.2 06/03/2023   CL 106 06/03/2023   CO2 23 06/03/2023   BUN 7 06/03/2023  CREATININE 0.96 06/03/2023   GLUCOSE 187 (H) 06/03/2023   GFRNONAA >60 06/03/2023   GFRAA >60 09/20/2016   CALCIUM 7.2 (L) 06/03/2023   PROT 5.3  (L) 05/20/2023   ALBUMIN <1.5 (L) 05/20/2023   BILITOT 0.5 05/20/2023   ALKPHOS 182 (H) 05/20/2023   AST 12 (L) 05/20/2023   ALT 12 05/20/2023   ANIONGAP 5 06/03/2023    GFR: Estimated Creatinine Clearance: 140.6 mL/min (by C-G formula based on SCr of 0.96 mg/dL).  No results found for this or any previous visit (from the past 240 hours).    Radiology Studies: No results found.     LOS: 66 days    Jacquelin Hawking, MD [facility] 06/12/2023, 9:55 AM   If 7PM-7AM, please contact night-coverage [email or web address]

## 2023-06-12 NOTE — Progress Notes (Signed)
Nutrition Follow-up  DOCUMENTATION CODES:   Non-severe (moderate) malnutrition in context of chronic illness, Obesity unspecified  INTERVENTION:   - Continue low sodium regular diet with double proteins and snacks daily  - Ensure Enlive po BID, each supplement provides 350 kcal and 20 grams of protein. *Consider Ensure max for less calories and more protein  - Continue MVI with minerals daily  NUTRITION DIAGNOSIS:   Moderate Malnutrition related to chronic illness as evidenced by energy intake < or equal to 75% for > or equal to 1 month, moderate fat depletion, moderate muscle depletion.  - Still applicable   GOAL:   Patient will meet greater than or equal to 90% of their needs  - progressing   MONITOR:   PO intake, Supplement acceptance, Weight trends  REASON FOR ASSESSMENT:   Consult Assessment of nutrition requirement/status, Calorie Count  ASSESSMENT:   48 y.o. M presented  from home with N/V/D x 2 weeks and right foot wound, admitted with sepsis. PMH; type 2 diabetes, CHF, CKD 2, BKA of left lower extremity and ongoing diabetic foot infection, s/p RBKA.  4/5 - LBKA 11/27- RBKA   Patient has fluctuating  appetite and PO intake due to abdominal pain and diarrhea. He states his diarrhea is from the medications as he had no issues at home. Discussed with him about dairy intake while being lactose intolerant. He reports it is not the dairy itself but how much he has at one time. He states he knows how much is too much and is able to manage it himself. The Ensures he drinks are lactose free so should not be causing him to have loose stools. He has been going from being constipated to having loose bowel movements frequently during this admission. Stool softeners discontinued.   He reports he did not eat much last night due to his diarrhea, but is motivated to eat more today. Is receiving double proteins with his meals Likes the Ensures and is drinking 2/day. Also likes his  snacks that he is eating 3 times per day. Noted 1 Liter of soda at bedside. May want to consider Ensure Max for less calories and more protein.   Of note RN checked stage 2 pressure injury on perineum on 1/15, and reports seeing no open areas of breakdown just bumps.    Admit weight: 145.2 kg  Current weight: 132.6 kg   Average Meal Intake: 1/23-1/31: 50% intake x 2 recorded meals  Nutritionally Relevant Medications: Scheduled Meds:  amLODipine  5 mg Oral Daily   enoxaparin (LOVENOX) injection  60 mg Subcutaneous Daily   feeding supplement  237 mL Oral BID BM   Gerhardt's butt cream   Topical BID   lidocaine  2 patch Transdermal Q24H   melatonin  5 mg Oral QHS   metoprolol succinate  200 mg Oral Daily   pantoprazole  40 mg Oral BID   sacubitril-valsartan  1 tablet Oral BID   Labs Reviewed: No recent labs  HgbA1c 6.8 (03/2023)  Diet Order:   Diet Order             Diet regular Room service appropriate? Yes; Fluid consistency: Thin  Diet effective now           Diet - low sodium heart healthy                   EDUCATION NEEDS:   Education needs have been addressed  Skin:  Skin Assessment: Skin Integrity Issues: Skin Integrity Issues::  Stage II, Incisions Stage II: Perinieum- RD checked 1/15 and no open areas of breakdown Incisions: R knee  Last BM:  1/14  Height:   Ht Readings from Last 1 Encounters:  04/08/23 6\' 4"  (1.93 m)    Weight:   Wt Readings from Last 1 Encounters:  06/10/23 133.8 kg    Ideal Body Weight:  79.16 kg  BMI:  Body mass index is 35.91 kg/m.  Estimated Nutritional Needs:   Kcal:  2300-2600 kcal  Protein:  130-160 gm  Fluid:  >2.2L  Elliot Dally, RD Registered Dietitian  See Amion for more information

## 2023-06-12 NOTE — TOC Progression Note (Signed)
Transition of Care (TOC) - Progression Note    Patient Details  Name: Alan Tapia. MRN: [id number] Date of Birth: [birth year]  Transition of Care [facility]) CM/SW Contact  Carley Hammed, LCSW Phone Number: 06/12/2023, 11:33 AM  Clinical Narrative:    CSW was advised by pt's spouse that they still owed $210 before they can move into the accessible apartment. Spouse states that no family member is able to assist at this time. Pt spouse was advised by DSS to call around for community resources, Spouse states that she has called churches and other resources with no luck. She states she is at home packing now. CSW sent in a fund request to the foundation, it is being reviewed. Barriers were discussed with team and DSS. TOC will continue to follow for DC needs.   Expected Discharge Plan: Skilled Nursing Facility Barriers to Discharge: Insurance Authorization, Continued Medical Work up, SNF Pending bed offer  Expected Discharge Plan and Services In-house Referral: Clinical Social Work     Living arrangements for the past 2 months: Single Family Home Expected Discharge Date: 04/11/23                                     Social Determinants of Health (SDOH) Interventions SDOH Screenings   Food Insecurity: No Food Insecurity (04/07/2023)  Housing: Unknown (04/23/2023)  Recent Concern: Housing - Medium Risk (04/07/2023)  Transportation Needs: No Transportation Needs (04/07/2023)  Recent Concern: Transportation Needs - Unmet Transportation Needs (02/17/2023)   Received from [facility]  Utilities: At Risk (04/07/2023)  Alcohol Screen: Low Risk  (08/20/2022)  Financial Resource Strain: Patient Unable To Answer (08/20/2022)  Tobacco Use: Low Risk  (04/08/2023)    Readmission Risk Interventions    04/07/2023    2:34 PM  Readmission Risk Prevention Plan  Transportation Screening Complete  PCP or Specialist Appt within 3-5 Days Complete  HRI or Home Care Consult Complete   Social Work Consult for Recovery Care Planning/Counseling Complete  Palliative Care Screening Not Applicable  Medication Review Oceanographer) Complete

## 2023-06-13 DIAGNOSIS — I5042 Chronic combined systolic (congestive) and diastolic (congestive) heart failure: Secondary | ICD-10-CM | POA: Diagnosis not present

## 2023-06-13 DIAGNOSIS — A419 Sepsis, unspecified organism: Secondary | ICD-10-CM | POA: Diagnosis not present

## 2023-06-13 DIAGNOSIS — Z89511 Acquired absence of right leg below knee: Secondary | ICD-10-CM | POA: Diagnosis not present

## 2023-06-13 DIAGNOSIS — R6520 Severe sepsis without septic shock: Secondary | ICD-10-CM | POA: Diagnosis not present

## 2023-06-13 NOTE — Plan of Care (Signed)
 Problem: Fluid Volume: Goal: Hemodynamic stability will improve Outcome: Progressing   Problem: Clinical Measurements: Goal: Diagnostic test results will improve Outcome: Progressing Goal: Signs and symptoms of infection will decrease Outcome: Progressing   Problem: Respiratory: Goal: Ability to maintain adequate ventilation will improve Outcome: Progressing   Problem: Education: Goal: Knowledge of General Education information will improve Description: Including pain rating scale, medication(s)/side effects and non-pharmacologic comfort measures Outcome: Progressing   Problem: Health Behavior/Discharge Planning: Goal: Ability to manage health-related needs will improve Outcome: Progressing   Problem: Clinical Measurements: Goal: Ability to maintain clinical measurements within normal limits will improve Outcome: Progressing Goal: Will remain free from infection Outcome: Progressing Goal: Diagnostic test results will improve Outcome: Progressing Goal: Respiratory complications will improve Outcome: Progressing Goal: Cardiovascular complication will be avoided Outcome: Progressing   Problem: Activity: Goal: Risk for activity intolerance will decrease Outcome: Progressing   Problem: Nutrition: Goal: Adequate nutrition will be maintained Outcome: Progressing   Problem: Coping: Goal: Level of anxiety will decrease Outcome: Progressing   Problem: Elimination: Goal: Will not experience complications related to bowel motility Outcome: Progressing Goal: Will not experience complications related to urinary retention Outcome: Progressing   Problem: Pain Management: Goal: General experience of comfort will improve Outcome: Progressing   Problem: Safety: Goal: Ability to remain free from injury will improve Outcome: Progressing   Problem: Skin Integrity: Goal: Risk for impaired skin integrity will decrease Outcome: Progressing   Problem: Activity: Goal: Ability  to tolerate increased activity will improve Outcome: Progressing   Problem: Clinical Measurements: Goal: Ability to maintain a body temperature in the normal range will improve Outcome: Progressing   Problem: Respiratory: Goal: Ability to maintain adequate ventilation will improve Outcome: Progressing Goal: Ability to maintain a clear airway will improve Outcome: Progressing   Problem: Education: Goal: Knowledge of the prescribed therapeutic regimen will improve Outcome: Progressing Goal: Ability to verbalize activity precautions or restrictions will improve Outcome: Progressing Goal: Understanding of discharge needs will improve Outcome: Progressing   Problem: Activity: Goal: Ability to perform//tolerate increased activity and mobilize with assistive devices will improve Outcome: Progressing   Problem: Clinical Measurements: Goal: Postoperative complications will be avoided or minimized Outcome: Progressing   Problem: Self-Care: Goal: Ability to meet self-care needs will improve Outcome: Progressing   Problem: Self-Concept: Goal: Ability to maintain and perform role responsibilities to the fullest extent possible will improve Outcome: Progressing   Problem: Education: Goal: Knowledge of General Education information will improve Description: Including pain rating scale, medication(s)/side effects and non-pharmacologic comfort measures Outcome: Progressing   Problem: Health Behavior/Discharge Planning: Goal: Ability to manage health-related needs will improve Outcome: Progressing   Problem: Clinical Measurements: Goal: Ability to maintain clinical measurements within normal limits will improve Outcome: Progressing Goal: Will remain free from infection Outcome: Progressing Goal: Diagnostic test results will improve Outcome: Progressing Goal: Respiratory complications will improve Outcome: Progressing Goal: Cardiovascular complication will be avoided Outcome:  Progressing   Problem: Activity: Goal: Risk for activity intolerance will decrease Outcome: Progressing   Problem: Nutrition: Goal: Adequate nutrition will be maintained Outcome: Progressing   Problem: Coping: Goal: Level of anxiety will decrease Outcome: Progressing   Problem: Elimination: Goal: Will not experience complications related to bowel motility Outcome: Progressing Goal: Will not experience complications related to urinary retention Outcome: Progressing   Problem: Pain Management: Goal: General experience of comfort will improve Outcome: Progressing   Problem: Safety: Goal: Ability to remain free from injury will improve Outcome: Progressing   Problem: Skin Integrity: Goal: Risk for  impaired skin integrity will decrease Outcome: Progressing

## 2023-06-13 NOTE — Progress Notes (Signed)
PROGRESS NOTE    Alan Tapia.  GNF:[id number] DOB: April 20, 1976 DOA: 04/06/2023 PCP: Patient, No Pcp Per   Brief Narrative: 48 year old male with past medical history significant for left lower extremity BKA, dilated cardiomyopathy, history of combined systolic and diastolic heart failure with EF 35 to 40%, hypertension, insulin-dependent diabetes, peripheral neuropathy, hyperlipidemia who presented to the emergency department on 04/06/2023 with complaints of odor, wound of his foot. He was also having nausea, vomiting and diarrhea. Orthopedic surgery was consulted and patient underwent right BKA 11/27 by Dr. Lajoyce Corners.   Assessment and Plan:  Sepsis Present on admission, secondary to right foot osteomyelitis. Blood cultures with no growth.  Right foot osteomyelitis s/p right BKA Patient was initially treated empirically with antibiotics. Orthopedic surgery performed a right BKA on 11/27. Antibiotics discontinued.  CKD stage II Stable.  Chronic combined systolic and diastolic heart failure Stable. -Continue Toprol XL and Entresto  Primary hypertension -Continue Toprol XL and Entresto  Diarrhea Recurrent. Likely related to patient's aggressive bowel regimen.  -Discontinue scheduled bowel regimen  Abdominal edema Mostly to left side of abdomen with resultant fluid filled striae and mild dermatitis. Patient declining diuresis at this time. Improved with changing positions.  Hemorrhoids Treated with Anusol cream.  Community acquired pneumonia Patient treated with Zosyn and doxycycline. Resolved.  Acute on chronic anemia Patient with hemoglobin drop down to 5.9 g/dL secondary to right foot wound, requiring 3 units of PRBC on 11/26. Patient requiring another 1 unit of PRBC on 11/30. Stable.  COVID-19 infection Patient completed isolation protocol. Patient treated with Paxlovid.  Constipation Holding bowel regimen secondary to worsening diarrhea.  Pressure  injury Bilateral perineum, unclear if present on admission.   DVT prophylaxis: Lovenox Code Status:   Code Status: Full Code Family Communication: None at bedside Disposition Plan: Discharge home pending TOC ability to ensure safe discharge home   Consultants:  Orthopedic surgery  Procedures:  Right below knee amputation  Antimicrobials: Paxlovid Vancomycin Zosyn Flagyl Doxycycline Cefepime   Subjective: No concern this morning.  Objective: BP 136/64 (BP Location: Right Arm)   Pulse 68   Temp 98.1 F (36.7 C) (Oral)   Resp 17   Ht 6\' 4"  (1.93 m)   Wt 128.8 kg   SpO2 98%   BMI 34.56 kg/m   Examination:  General exam: Appears calm and comfortable   Data Reviewed: I have personally reviewed following labs and imaging studies  CBC Lab Results  Component Value Date   WBC 4.7 06/03/2023   RBC 3.92 (L) 06/03/2023   HGB 10.1 (L) 06/03/2023   HCT 33.7 (L) 06/03/2023   MCV 86.0 06/03/2023   MCH 25.8 (L) 06/03/2023   PLT 296 06/03/2023   MCHC 30.0 06/03/2023   RDW 15.1 06/03/2023   LYMPHSABS 1.9 05/10/2023   MONOABS 0.5 05/10/2023   EOSABS 0.1 05/10/2023   BASOSABS 0.1 05/10/2023     Last metabolic panel Lab Results  Component Value Date   NA 134 (L) 06/03/2023   K 4.2 06/03/2023   CL 106 06/03/2023   CO2 23 06/03/2023   BUN 7 06/03/2023   CREATININE 0.96 06/03/2023   GLUCOSE 187 (H) 06/03/2023   GFRNONAA >60 06/03/2023   GFRAA >60 09/20/2016   CALCIUM 7.2 (L) 06/03/2023   PROT 5.3 (L) 05/20/2023   ALBUMIN <1.5 (L) 05/20/2023   BILITOT 0.5 05/20/2023   ALKPHOS 182 (H) 05/20/2023   AST 12 (L) 05/20/2023   ALT 12 05/20/2023   ANIONGAP 5 06/03/2023  GFR: Estimated Creatinine Clearance: 137.9 mL/min (by C-G formula based on SCr of 0.96 mg/dL).  No results found for this or any previous visit (from the past 240 hours).    Radiology Studies: DG Abd Portable 1V Result Date: 06/12/2023 CLINICAL DATA:  [id number] Abdominal pain [id number] EXAM:  PORTABLE ABDOMEN - 1 VIEW COMPARISON:  05/26/2023 FINDINGS: Small bowel loop within the central abdomen measuring upper limits of normal in caliber (3.0 cm). No additional abnormally distended loops of bowel. Moderate volume stool within the colon. No gross free intraperitoneal air on supine imaging. IMPRESSION: 1. Small bowel loop within the central abdomen measuring upper limits of normal in caliber. Findings may reflect ileus or enteritis although a early/partial small bowel obstruction is not excluded. 2. Moderate volume stool within the colon. Electronically Signed   By: Duanne Guess D.O.   On: 06/12/2023 14:44       LOS: 67 days    Jacquelin Hawking, MD [facility] 06/13/2023, 12:21 PM   If 7PM-7AM, please contact night-coverage [email or web address]

## 2023-06-14 DIAGNOSIS — I5042 Chronic combined systolic (congestive) and diastolic (congestive) heart failure: Secondary | ICD-10-CM | POA: Diagnosis not present

## 2023-06-14 DIAGNOSIS — M86171 Other acute osteomyelitis, right ankle and foot: Secondary | ICD-10-CM | POA: Diagnosis not present

## 2023-06-14 DIAGNOSIS — Z89511 Acquired absence of right leg below knee: Secondary | ICD-10-CM | POA: Diagnosis not present

## 2023-06-14 NOTE — Progress Notes (Signed)
PROGRESS NOTE    Alan Tapia.  WGN:[id number] DOB: 1976-02-23 DOA: 04/06/2023 PCP: Patient, No Pcp Per   Brief Narrative: 48 year old male with past medical history significant for left lower extremity BKA, dilated cardiomyopathy, history of combined systolic and diastolic heart failure with EF 35 to 40%, hypertension, insulin-dependent diabetes, peripheral neuropathy, hyperlipidemia who presented to the emergency department on 04/06/2023 with complaints of odor, wound of his foot. He was also having nausea, vomiting and diarrhea. Orthopedic surgery was consulted and patient underwent right BKA 11/27 by Dr. Lajoyce Corners.   Assessment and Plan:  Sepsis Present on admission, secondary to right foot osteomyelitis. Blood cultures with no growth.  Right foot osteomyelitis s/p right BKA Patient was initially treated empirically with antibiotics. Orthopedic surgery performed a right BKA on 11/27. Antibiotics discontinued.  CKD stage II Stable.  Chronic combined systolic and diastolic heart failure Stable. -Continue Toprol XL and Entresto  Primary hypertension -Continue Toprol XL and Entresto  Diarrhea Recurrent. Likely related to patient's aggressive bowel regimen.  -Discontinue scheduled bowel regimen  Abdominal edema Mostly to left side of abdomen with resultant fluid filled striae and mild dermatitis. Patient declining diuresis at this time. Improved with changing positions.  Hemorrhoids Treated with Anusol cream.  Community acquired pneumonia Patient treated with Zosyn and doxycycline. Resolved.  Acute on chronic anemia Patient with hemoglobin drop down to 5.9 g/dL secondary to right foot wound, requiring 3 units of PRBC on 11/26. Patient requiring another 1 unit of PRBC on 11/30. Stable.  COVID-19 infection Patient completed isolation protocol. Patient treated with Paxlovid.  Constipation Holding bowel regimen secondary to worsening diarrhea.  Pressure  injury Bilateral perineum, unclear if present on admission.   DVT prophylaxis: Lovenox Code Status:   Code Status: Full Code Family Communication: None at bedside Disposition Plan: Discharge home pending TOC ability to ensure safe discharge home   Consultants:  Orthopedic surgery  Procedures:  Right below knee amputation  Antimicrobials: Paxlovid Vancomycin Zosyn Flagyl Doxycycline Cefepime   Subjective: No concerns for me.  Objective: BP (!) 159/79 (BP Location: Right Arm)   Pulse 75   Temp 98.4 F (36.9 C) (Oral)   Resp 14   Ht 6\' 4"  (1.93 m)   Wt 128.8 kg   SpO2 98%   BMI 34.56 kg/m   Examination:  General exam: Appears calm and comfortable   Data Reviewed: I have personally reviewed following labs and imaging studies  CBC Lab Results  Component Value Date   WBC 4.7 06/03/2023   RBC 3.92 (L) 06/03/2023   HGB 10.1 (L) 06/03/2023   HCT 33.7 (L) 06/03/2023   MCV 86.0 06/03/2023   MCH 25.8 (L) 06/03/2023   PLT 296 06/03/2023   MCHC 30.0 06/03/2023   RDW 15.1 06/03/2023   LYMPHSABS 1.9 05/10/2023   MONOABS 0.5 05/10/2023   EOSABS 0.1 05/10/2023   BASOSABS 0.1 05/10/2023     Last metabolic panel Lab Results  Component Value Date   NA 134 (L) 06/03/2023   K 4.2 06/03/2023   CL 106 06/03/2023   CO2 23 06/03/2023   BUN 7 06/03/2023   CREATININE 0.96 06/03/2023   GLUCOSE 187 (H) 06/03/2023   GFRNONAA >60 06/03/2023   GFRAA >60 09/20/2016   CALCIUM 7.2 (L) 06/03/2023   PROT 5.3 (L) 05/20/2023   ALBUMIN <1.5 (L) 05/20/2023   BILITOT 0.5 05/20/2023   ALKPHOS 182 (H) 05/20/2023   AST 12 (L) 05/20/2023   ALT 12 05/20/2023   ANIONGAP 5 06/03/2023  GFR: Estimated Creatinine Clearance: 137.9 mL/min (by C-G formula based on SCr of 0.96 mg/dL).  No results found for this or any previous visit (from the past 240 hours).    Radiology Studies: No results found.      LOS: 68 days    Jacquelin Hawking, MD [facility] 06/14/2023,  11:40 AM   If 7PM-7AM, please contact night-coverage [email or web address]

## 2023-06-14 NOTE — Plan of Care (Signed)
  Problem: Fluid Volume: Goal: Hemodynamic stability will improve Outcome: Progressing   Problem: Clinical Measurements: Goal: Diagnostic test results will improve Outcome: Progressing   Problem: Respiratory: Goal: Ability to maintain adequate ventilation will improve Outcome: Progressing   Problem: Clinical Measurements: Goal: Ability to maintain clinical measurements within normal limits will improve Outcome: Progressing   Problem: Elimination: Goal: Will not experience complications related to bowel motility Outcome: Progressing   Problem: Pain Management: Goal: General experience of comfort will improve Outcome: Progressing   Problem: Activity: Goal: Risk for activity intolerance will decrease Outcome: Progressing   Problem: Safety: Goal: Ability to remain free from injury will improve Outcome: Progressing

## 2023-06-15 DIAGNOSIS — I5042 Chronic combined systolic (congestive) and diastolic (congestive) heart failure: Secondary | ICD-10-CM | POA: Diagnosis not present

## 2023-06-15 DIAGNOSIS — M86171 Other acute osteomyelitis, right ankle and foot: Secondary | ICD-10-CM | POA: Diagnosis not present

## 2023-06-15 DIAGNOSIS — Z89511 Acquired absence of right leg below knee: Secondary | ICD-10-CM | POA: Diagnosis not present

## 2023-06-15 NOTE — TOC Progression Note (Addendum)
Transition of Care (TOC) - Progression Note    Patient Details  Name: Alan Tapia. MRN: [id number] Date of Birth: 02-Feb-1976  Transition of Care [facility]) CM/SW Contact  Carley Hammed, LCSW Phone Number: 06/15/2023, 10:50 AM  Clinical Narrative:    CSW followed up with DSS who has no updates at this time on specific cost amount for pt's apartment. No updates yet if pt qualifies for assistance through [facility]. TOC will continue to follow.   11:30 CSW received a phone call from pt's spouse who asked if it is possible for pt to be placed in a facility if they are unable to secure the apartment. CSW reiterated that the plan had been facility placement, without the disability policy, there is no payor source. CSW attempted to explain again that the disability policy is what the team has been waiting on for two months, and without it being submitted, placement is not an option at this time.  CSW received a call from Orland at [facility] who noted she had received a call from [facility] requesting placement. CSW explained to [place] the barriers and Wille Celeste noted it would be 10,000 OOP for pt to be admitted there for a month. Spouse had advised Wadie Lessen pt has a Air cabin crew, this expired at the beginning of the year. Right now pt has a Medicaid [employer] that REQUIRES a disability policy for placement due to pt's age. Facility rep also noting that placement is not an option at this time.  CSW received a call from Aundra Millet at [facility], the state office. She states that spouse called her and stated that the hospital was refusing to provide the papers to place him for LTC. Rose notes that she is unsure of how spouse got her number as she is not able to assist with this. She also states, that spouse notes an active CPS case that the spouse has (not the pt's children). CSW spoke with [place] who notes she is continuing to work with the apartment Production designer, theatre/television/film, but they are becoming frustrated due to inaction from the pt and spouse.  Hale Bogus to continue to follow in the community but is noting that spouse is now actively trying to back out of taking pt home.  CSW spoke with Scherry Ran in Financial counseling who continues to work on the disability application with family. TOC continues to follow.   3:00 CSW was sent several emails by pt's spouse, one being a request from [employer] to make a payment to reactivate pt's policy. The other was an application for [facility] services. Hale Bogus with DSS forwarded several messages from the spouse stating that the spouse had emailed CSW documentation for short term rehab, and documentation to continue services without disability. She also told DSS that this CSW told the facilities she is calling that pt cannot go to their facility because he "needs memory care" and is "too disabled". CSW explained to the facility reps and on multiple occasions the spouse and pt that without the disability in place, facilities cannot take the Medicaid as a payor source.  CSW met with CM, pt, pt's mother at bedside, spouse on speaker phone. Spouse stating she was told by [facility] with [employer] that these applications will get pt STR or LTC, CSW and CM advised that is not accurate, these are community programs. Spouse became confrontational, stating that she was told by the Medicaid worker Dorathy Daft that they needed documentation and spouse was told that this should already be in place. The number provided to  Kayla with Medicaid is for the main [facility] office, no extension. CSW attempted to educate again on the process and stated that it has been everyone's goal to have pt get rehab, no one is undermining that goal or sharing different information.   Pt's mother asked about expediting Disability determination. CSW assured pt and mother that the [facility] team is working hard to get disability in place. Pt noted understanding of the process CSW was explaining. CSW spoke with Hale Bogus again who stated she spoke with Eli Phillips at [employer] who  noted she had no info for the [facility] and that referral would not help with placement. Pt to follow up with [employer] on wether the policy is active and if they have any SNF benefits. TOC will continue to follow.  Expected Discharge Plan: Skilled Nursing Facility Barriers to Discharge: Insurance Authorization, Continued Medical Work up, SNF Pending bed offer  Expected Discharge Plan and Services In-house Referral: Clinical Social Work     Living arrangements for the past 2 months: Single Family Home Expected Discharge Date: 04/11/23                                     Social Determinants of Health (SDOH) Interventions SDOH Screenings   Food Insecurity: No Food Insecurity (04/07/2023)  Housing: Unknown (04/23/2023)  Recent Concern: Housing - Medium Risk (04/07/2023)  Transportation Needs: No Transportation Needs (04/07/2023)  Recent Concern: Transportation Needs - Unmet Transportation Needs (02/17/2023)   Received from [facility]  Utilities: At Risk (04/07/2023)  Alcohol Screen: Low Risk  (08/20/2022)  Financial Resource Strain: Patient Unable To Answer (08/20/2022)  Tobacco Use: Low Risk  (04/08/2023)    Readmission Risk Interventions    04/07/2023    2:34 PM  Readmission Risk Prevention Plan  Transportation Screening Complete  PCP or Specialist Appt within 3-5 Days Complete  HRI or Home Care Consult Complete  Social Work Consult for Recovery Care Planning/Counseling Complete  Palliative Care Screening Not Applicable  Medication Review Oceanographer) Complete

## 2023-06-15 NOTE — Plan of Care (Signed)
 Problem: Fluid Volume: Goal: Hemodynamic stability will improve Outcome: Progressing   Problem: Clinical Measurements: Goal: Diagnostic test results will improve Outcome: Progressing Goal: Signs and symptoms of infection will decrease Outcome: Progressing   Problem: Respiratory: Goal: Ability to maintain adequate ventilation will improve Outcome: Progressing   Problem: Education: Goal: Knowledge of General Education information will improve Description: Including pain rating scale, medication(s)/side effects and non-pharmacologic comfort measures Outcome: Progressing   Problem: Health Behavior/Discharge Planning: Goal: Ability to manage health-related needs will improve Outcome: Progressing   Problem: Clinical Measurements: Goal: Ability to maintain clinical measurements within normal limits will improve Outcome: Progressing Goal: Will remain free from infection Outcome: Progressing Goal: Diagnostic test results will improve Outcome: Progressing Goal: Respiratory complications will improve Outcome: Progressing Goal: Cardiovascular complication will be avoided Outcome: Progressing   Problem: Activity: Goal: Risk for activity intolerance will decrease Outcome: Progressing   Problem: Nutrition: Goal: Adequate nutrition will be maintained Outcome: Progressing   Problem: Coping: Goal: Level of anxiety will decrease Outcome: Progressing   Problem: Elimination: Goal: Will not experience complications related to bowel motility Outcome: Progressing Goal: Will not experience complications related to urinary retention Outcome: Progressing   Problem: Pain Management: Goal: General experience of comfort will improve Outcome: Progressing   Problem: Safety: Goal: Ability to remain free from injury will improve Outcome: Progressing   Problem: Skin Integrity: Goal: Risk for impaired skin integrity will decrease Outcome: Progressing   Problem: Activity: Goal: Ability  to tolerate increased activity will improve Outcome: Progressing   Problem: Clinical Measurements: Goal: Ability to maintain a body temperature in the normal range will improve Outcome: Progressing   Problem: Respiratory: Goal: Ability to maintain adequate ventilation will improve Outcome: Progressing Goal: Ability to maintain a clear airway will improve Outcome: Progressing   Problem: Education: Goal: Knowledge of the prescribed therapeutic regimen will improve Outcome: Progressing Goal: Ability to verbalize activity precautions or restrictions will improve Outcome: Progressing Goal: Understanding of discharge needs will improve Outcome: Progressing   Problem: Activity: Goal: Ability to perform//tolerate increased activity and mobilize with assistive devices will improve Outcome: Progressing   Problem: Clinical Measurements: Goal: Postoperative complications will be avoided or minimized Outcome: Progressing   Problem: Self-Care: Goal: Ability to meet self-care needs will improve Outcome: Progressing   Problem: Self-Concept: Goal: Ability to maintain and perform role responsibilities to the fullest extent possible will improve Outcome: Progressing   Problem: Education: Goal: Knowledge of General Education information will improve Description: Including pain rating scale, medication(s)/side effects and non-pharmacologic comfort measures Outcome: Progressing   Problem: Health Behavior/Discharge Planning: Goal: Ability to manage health-related needs will improve Outcome: Progressing   Problem: Clinical Measurements: Goal: Ability to maintain clinical measurements within normal limits will improve Outcome: Progressing Goal: Will remain free from infection Outcome: Progressing Goal: Diagnostic test results will improve Outcome: Progressing Goal: Respiratory complications will improve Outcome: Progressing Goal: Cardiovascular complication will be avoided Outcome:  Progressing   Problem: Activity: Goal: Risk for activity intolerance will decrease Outcome: Progressing   Problem: Nutrition: Goal: Adequate nutrition will be maintained Outcome: Progressing   Problem: Coping: Goal: Level of anxiety will decrease Outcome: Progressing   Problem: Elimination: Goal: Will not experience complications related to bowel motility Outcome: Progressing Goal: Will not experience complications related to urinary retention Outcome: Progressing   Problem: Pain Management: Goal: General experience of comfort will improve Outcome: Progressing   Problem: Safety: Goal: Ability to remain free from injury will improve Outcome: Progressing   Problem: Skin Integrity: Goal: Risk for  impaired skin integrity will decrease Outcome: Progressing

## 2023-06-15 NOTE — Progress Notes (Signed)
PROGRESS NOTE    Alan Tapia.  ZOX:[id number] DOB: [birth year] DOA: 04/06/2023 PCP: Patient, No Pcp Per   Brief Narrative: 48 year old male with past medical history significant for left lower extremity BKA, dilated cardiomyopathy, history of combined systolic and diastolic heart failure with EF 35 to 40%, hypertension, insulin-dependent diabetes, peripheral neuropathy, hyperlipidemia who presented to the emergency department on 04/06/2023 with complaints of odor, wound of his foot. He was also having nausea, vomiting and diarrhea. Orthopedic surgery was consulted and patient underwent right BKA 11/27 by Dr. Lajoyce Corners.   Assessment and Plan:  Sepsis Present on admission, secondary to right foot osteomyelitis. Blood cultures with no growth.  Right foot osteomyelitis s/p right BKA Patient was initially treated empirically with antibiotics. Orthopedic surgery performed a right BKA on 11/27. Antibiotics discontinued.  CKD stage II Stable.  Chronic combined systolic and diastolic heart failure Stable. -Continue Toprol XL and Entresto  Primary hypertension -Continue Toprol XL and Entresto  Diarrhea Recurrent. Likely related to patient's aggressive bowel regimen.  -Discontinue scheduled bowel regimen  Abdominal edema Mostly to left side of abdomen with resultant fluid filled striae and mild dermatitis. Patient declining diuresis at this time. Improved with changing positions.  Hemorrhoids Treated with Anusol cream.  Community acquired pneumonia Patient treated with Zosyn and doxycycline. Resolved.  Acute on chronic anemia Patient with hemoglobin drop down to 5.9 g/dL secondary to right foot wound, requiring 3 units of PRBC on 11/26. Patient requiring another 1 unit of PRBC on 11/30. Stable.  COVID-19 infection Patient completed isolation protocol. Patient treated with Paxlovid.  Constipation Holding bowel regimen secondary to worsening diarrhea.  Pressure  injury Bilateral perineum, unclear if present on admission.   DVT prophylaxis: Lovenox Code Status:   Code Status: Full Code Family Communication: None at bedside Disposition Plan: Discharge home pending TOC ability to ensure safe discharge home   Consultants:  Orthopedic surgery  Procedures:  Right below knee amputation  Antimicrobials: Paxlovid Vancomycin Zosyn Flagyl Doxycycline Cefepime   Subjective: Continues to have no medical concerns at this time.  Objective: BP (!) 162/88 (BP Location: Right Arm)   Pulse 79   Temp 98.2 F (36.8 C) (Oral)   Resp 16   Ht 6\' 4"  (1.93 m)   Wt 128.8 kg   SpO2 100%   BMI 34.56 kg/m   Examination:  General exam: Appears calm and comfortable   Data Reviewed: I have personally reviewed following labs and imaging studies  CBC Lab Results  Component Value Date   WBC 4.7 06/03/2023   RBC 3.92 (L) 06/03/2023   HGB 10.1 (L) 06/03/2023   HCT 33.7 (L) 06/03/2023   MCV 86.0 06/03/2023   MCH 25.8 (L) 06/03/2023   PLT 296 06/03/2023   MCHC 30.0 06/03/2023   RDW 15.1 06/03/2023   LYMPHSABS 1.9 05/10/2023   MONOABS 0.5 05/10/2023   EOSABS 0.1 05/10/2023   BASOSABS 0.1 05/10/2023     Last metabolic panel Lab Results  Component Value Date   NA 134 (L) 06/03/2023   K 4.2 06/03/2023   CL 106 06/03/2023   CO2 23 06/03/2023   BUN 7 06/03/2023   CREATININE 0.96 06/03/2023   GLUCOSE 187 (H) 06/03/2023   GFRNONAA >60 06/03/2023   GFRAA >60 09/20/2016   CALCIUM 7.2 (L) 06/03/2023   PROT 5.3 (L) 05/20/2023   ALBUMIN <1.5 (L) 05/20/2023   BILITOT 0.5 05/20/2023   ALKPHOS 182 (H) 05/20/2023   AST 12 (L) 05/20/2023   ALT 12 05/20/2023  ANIONGAP 5 06/03/2023    GFR: Estimated Creatinine Clearance: 137.9 mL/min (by C-G formula based on SCr of 0.96 mg/dL).  No results found for this or any previous visit (from the past 240 hours).    Radiology Studies: No results found.      LOS: 69 days    Jacquelin Hawking,  MD [facility] 06/15/2023, 9:33 AM   If 7PM-7AM, please contact night-coverage [email or web address]

## 2023-06-15 NOTE — Progress Notes (Signed)
PT Cancellation Note  Patient Details Name: Alan Tapia. MRN: [id number] DOB: [birth year]   Cancelled Treatment:    Reason Eval/Treat Not Completed: (P) Other (comment) (pt on phone working on discharge planning, PTA to reattempt later if time.) Will continue efforts per Physical Therapy Plan of Care as schedule permits.   Dorathy Kinsman Sabina Beavers 06/15/2023, 4:27 PM

## 2023-06-15 NOTE — TOC Progression Note (Addendum)
Transition of Care (TOC) - Progression Note   Patient's wife [name] emailed PCS through Medicaid forms to [facility] Team asking for them to be completed.   TOC Team spoke at bedside with patient, his mother and [name] on speaker phone. Samanatha stated the forms she emailed were for short term rehab . NCM printed the emailed and showed patient form is the [id number] Request For Independent Assessment For Personal Care Services [facility]) Attestation of Medical Need   Explained forms were for PCS in the home. Patient will need a PCP to follow to complete on going paperwork . This was discussed earlier in the admission.  Paperwork requires home address ( waiting on handicap apartment) and discharge date from hospital or SNF . NCM explained all this to patient, his wife and mother.  NCM completed paperwork and sent secure message to attending to see if he will sign. MD signed paperwork. Paperwork faxed , again paperwork requires date patient discharged from hospital or SNF . NCM faxed at wife's request  Patient Details  Name: Alan Tapia. MRN: [id number] Date of Birth: April 10, 1976  Transition of Care [facility]) CM/SW Contact  Advit Trethewey, Adria Devon, RN Phone Number: 06/15/2023, 3:15 PM  Clinical Narrative:       Expected Discharge Plan: Skilled Nursing Facility Barriers to Discharge: Insurance Authorization, Continued Medical Work up, SNF Pending bed offer  Expected Discharge Plan and Services In-house Referral: Clinical Social Work     Living arrangements for the past 2 months: Single Family Home Expected Discharge Date: 04/11/23                                     Social Determinants of Health (SDOH) Interventions SDOH Screenings   Food Insecurity: No Food Insecurity (04/07/2023)  Housing: Unknown (04/23/2023)  Recent Concern: Housing - Medium Risk (04/07/2023)  Transportation Needs: No Transportation Needs (04/07/2023)  Recent Concern: Transportation Needs - Unmet Transportation  Needs (02/17/2023)   Received from [facility]  Utilities: At Risk (04/07/2023)  Alcohol Screen: Low Risk  (08/20/2022)  Financial Resource Strain: Patient Unable To Answer (08/20/2022)  Tobacco Use: Low Risk  (04/08/2023)    Readmission Risk Interventions    04/07/2023    2:34 PM  Readmission Risk Prevention Plan  Transportation Screening Complete  PCP or Specialist Appt within 3-5 Days Complete  HRI or Home Care Consult Complete  Social Work Consult for Recovery Care Planning/Counseling Complete  Palliative Care Screening Not Applicable  Medication Review Oceanographer) Complete

## 2023-06-16 DIAGNOSIS — M86171 Other acute osteomyelitis, right ankle and foot: Secondary | ICD-10-CM | POA: Diagnosis not present

## 2023-06-16 DIAGNOSIS — I5042 Chronic combined systolic (congestive) and diastolic (congestive) heart failure: Secondary | ICD-10-CM | POA: Diagnosis not present

## 2023-06-16 DIAGNOSIS — Z89511 Acquired absence of right leg below knee: Secondary | ICD-10-CM | POA: Diagnosis not present

## 2023-06-16 NOTE — Progress Notes (Signed)
 PROGRESS NOTE    Alan Tapia.  FMW:[id number] DOB: 09/27/1975 DOA: 04/06/2023 PCP: Patient, No Pcp Per   Brief Narrative: 48 year old male with past medical history significant for left lower extremity BKA, dilated cardiomyopathy, history of combined systolic and diastolic heart failure with EF 35 to 40%, hypertension, insulin -dependent diabetes, peripheral neuropathy, hyperlipidemia who presented to the emergency department on 04/06/2023 with complaints of odor, wound of his foot. He was also having nausea, vomiting and diarrhea. Orthopedic surgery was consulted and patient underwent right BKA 11/27 by Dr. Harden.   Assessment and Plan:  Sepsis Present on admission, secondary to right foot osteomyelitis. Blood cultures with no growth.  Right foot osteomyelitis s/p right BKA Patient was initially treated empirically with antibiotics. Orthopedic surgery performed a right BKA on 11/27. Antibiotics discontinued.  CKD stage II Stable.  Chronic combined systolic and diastolic heart failure Stable. -Continue Toprol  XL and Entresto   Primary hypertension -Continue Toprol  XL and Entresto   Diarrhea Recurrent. Likely related to patient's aggressive bowel regimen. Improved after discontinuing bowel regimen.  Abdominal edema Mostly to left side of abdomen with resultant fluid filled striae and mild dermatitis. Patient declining diuresis at this time. Improved with changing positions.  Hemorrhoids Treated with Anusol  cream.  Community acquired pneumonia Patient treated with Zosyn  and doxycycline . Resolved.  Acute on chronic anemia Patient with hemoglobin drop down to 5.9 g/dL secondary to right foot wound, requiring 3 units of PRBC on 11/26. Patient requiring another 1 unit of PRBC on 11/30. Stable.  COVID-19 infection Patient completed isolation protocol. Patient treated with Paxlovid .  Constipation Holding bowel regimen secondary to worsening diarrhea.  Pressure  injury Bilateral perineum, unclear if present on admission.   DVT prophylaxis: Lovenox  Code Status:   Code Status: Full Code Family Communication: Mother at bedside Disposition Plan: Discharge home pending TOC ability to ensure safe discharge home   Consultants:  Orthopedic surgery  Procedures:  Right below knee amputation  Antimicrobials: Paxlovid  Vancomycin  Zosyn  Flagyl  Doxycycline  Cefepime    Subjective: Some abdominal pain. No other concerns.  Objective: BP (!) 170/85 (BP Location: Right Arm)   Pulse 78   Temp 97.7 F (36.5 C) (Oral)   Resp 17   Ht 6' 4 (1.93 m)   Wt 128.8 kg   SpO2 97%   BMI 34.56 kg/m   Examination:  General exam: Appears calm and comfortable   Data Reviewed: I have personally reviewed following labs and imaging studies  CBC Lab Results  Component Value Date   WBC 4.7 06/03/2023   RBC 3.92 (L) 06/03/2023   HGB 10.1 (L) 06/03/2023   HCT 33.7 (L) 06/03/2023   MCV 86.0 06/03/2023   MCH 25.8 (L) 06/03/2023   PLT 296 06/03/2023   MCHC 30.0 06/03/2023   RDW 15.1 06/03/2023   LYMPHSABS 1.9 05/10/2023   MONOABS 0.5 05/10/2023   EOSABS 0.1 05/10/2023   BASOSABS 0.1 05/10/2023     Last metabolic panel Lab Results  Component Value Date   NA 134 (L) 06/03/2023   K 4.2 06/03/2023   CL 106 06/03/2023   CO2 23 06/03/2023   BUN 7 06/03/2023   CREATININE 0.96 06/03/2023   GLUCOSE 187 (H) 06/03/2023   GFRNONAA >60 06/03/2023   GFRAA >60 09/20/2016   CALCIUM  7.2 (L) 06/03/2023   PROT 5.3 (L) 05/20/2023   ALBUMIN  <1.5 (L) 05/20/2023   BILITOT 0.5 05/20/2023   ALKPHOS 182 (H) 05/20/2023   AST 12 (L) 05/20/2023   ALT 12 05/20/2023   ANIONGAP 5  06/03/2023    GFR: Estimated Creatinine Clearance: 137.9 mL/min (by C-G formula based on SCr of 0.96 mg/dL).  No results found for this or any previous visit (from the past 240 hours).    Radiology Studies: No results found.      LOS: 70 days    Elgin Lam, MD [facility] 06/16/2023, 10:59 AM   If 7PM-7AM, please contact night-coverage [email or web address]

## 2023-06-16 NOTE — Progress Notes (Signed)
 OT Cancellation Note  Patient Details Name: Alan Tapia. MRN: [id number] DOB: 1975/09/19   Cancelled Treatment:    Reason Eval/Treat Not Completed: Pain limiting ability to participate (Patient stating 10/10 back pain and asked for  bed level exercises with PT. OT to attempt on later date.) Dick Laine, OTA Acute Rehabilitation Services  Office [phone]  Jeb LITTIE Laine 06/16/2023, 3:04 PM

## 2023-06-16 NOTE — Plan of Care (Signed)
 Problem: Fluid Volume: Goal: Hemodynamic stability will improve Outcome: Progressing   Problem: Clinical Measurements: Goal: Diagnostic test results will improve Outcome: Progressing Goal: Signs and symptoms of infection will decrease Outcome: Progressing   Problem: Respiratory: Goal: Ability to maintain adequate ventilation will improve Outcome: Progressing   Problem: Education: Goal: Knowledge of General Education information will improve Description: Including pain rating scale, medication(s)/side effects and non-pharmacologic comfort measures Outcome: Progressing   Problem: Health Behavior/Discharge Planning: Goal: Ability to manage health-related needs will improve Outcome: Progressing   Problem: Clinical Measurements: Goal: Ability to maintain clinical measurements within normal limits will improve Outcome: Progressing Goal: Will remain free from infection Outcome: Progressing Goal: Diagnostic test results will improve Outcome: Progressing Goal: Respiratory complications will improve Outcome: Progressing Goal: Cardiovascular complication will be avoided Outcome: Progressing   Problem: Activity: Goal: Risk for activity intolerance will decrease Outcome: Progressing   Problem: Nutrition: Goal: Adequate nutrition will be maintained Outcome: Progressing   Problem: Coping: Goal: Level of anxiety will decrease Outcome: Progressing   Problem: Elimination: Goal: Will not experience complications related to bowel motility Outcome: Progressing Goal: Will not experience complications related to urinary retention Outcome: Progressing   Problem: Pain Management: Goal: General experience of comfort will improve Outcome: Progressing   Problem: Safety: Goal: Ability to remain free from injury will improve Outcome: Progressing   Problem: Skin Integrity: Goal: Risk for impaired skin integrity will decrease Outcome: Progressing   Problem: Activity: Goal: Ability  to tolerate increased activity will improve Outcome: Progressing   Problem: Clinical Measurements: Goal: Ability to maintain a body temperature in the normal range will improve Outcome: Progressing   Problem: Respiratory: Goal: Ability to maintain adequate ventilation will improve Outcome: Progressing Goal: Ability to maintain a clear airway will improve Outcome: Progressing   Problem: Education: Goal: Knowledge of the prescribed therapeutic regimen will improve Outcome: Progressing Goal: Ability to verbalize activity precautions or restrictions will improve Outcome: Progressing Goal: Understanding of discharge needs will improve Outcome: Progressing   Problem: Activity: Goal: Ability to perform//tolerate increased activity and mobilize with assistive devices will improve Outcome: Progressing   Problem: Clinical Measurements: Goal: Postoperative complications will be avoided or minimized Outcome: Progressing   Problem: Self-Care: Goal: Ability to meet self-care needs will improve Outcome: Progressing   Problem: Self-Concept: Goal: Ability to maintain and perform role responsibilities to the fullest extent possible will improve Outcome: Progressing   Problem: Education: Goal: Knowledge of General Education information will improve Description: Including pain rating scale, medication(s)/side effects and non-pharmacologic comfort measures Outcome: Progressing   Problem: Health Behavior/Discharge Planning: Goal: Ability to manage health-related needs will improve Outcome: Progressing   Problem: Clinical Measurements: Goal: Ability to maintain clinical measurements within normal limits will improve Outcome: Progressing Goal: Will remain free from infection Outcome: Progressing Goal: Diagnostic test results will improve Outcome: Progressing Goal: Respiratory complications will improve Outcome: Progressing Goal: Cardiovascular complication will be avoided Outcome:  Progressing   Problem: Activity: Goal: Risk for activity intolerance will decrease Outcome: Progressing   Problem: Nutrition: Goal: Adequate nutrition will be maintained Outcome: Progressing   Problem: Coping: Goal: Level of anxiety will decrease Outcome: Progressing   Problem: Elimination: Goal: Will not experience complications related to bowel motility Outcome: Progressing Goal: Will not experience complications related to urinary retention Outcome: Progressing   Problem: Pain Management: Goal: General experience of comfort will improve Outcome: Progressing   Problem: Safety: Goal: Ability to remain free from injury will improve Outcome: Progressing   Problem: Skin Integrity: Goal: Risk for  impaired skin integrity will decrease Outcome: Progressing

## 2023-06-16 NOTE — Progress Notes (Signed)
 PT Cancellation Note  Patient Details Name: Alan Tapia. MRN: [id number] DOB: Feb 23, 1976   Cancelled Treatment:    Reason Eval/Treat Not Completed: Pain limiting ability to participate (pt declining due to back and stomach spasms and pain; RN notified).  [name], PT, DPT Acute Rehabilitation Services Office [phone]    [name] 06/16/2023, 3:48 PM

## 2023-06-16 NOTE — TOC Progression Note (Signed)
 Transition of Care (TOC) - Progression Note    Patient Details  Name: Alan Tapia. MRN: [id number] Date of Birth: 08/22/1975  Transition of Care [facility]) CM/SW Contact  Harlene Sharps, LCSW Phone Number: 06/16/2023, 2:18 PM  Clinical Narrative:    CSW received an email forwarded from pt's spouse in which she was requesting another FL2 for another DSS worker for special assistance.  CSW spoke with Sharlet at DSS who noted she is the spouses children and family Medicaid worker and does not deal with the Medicaid for [name]. Per Sharlet the email she received stated that this CSW was refusing to place pt, set him up with a PCP or complete paperwork. Sharlet was advised of the process and she will send email to [place], pt's Medicaid worker.  CSW received an email from [facility] stating that pt will need to be placed and have the facility do an FL2 before they can approve LTC Medicaid. Ileana noted that disability paperwork was turned in on Jan. 21st and should be active now. Kayla to send documentation of pt's disability. CSW currently has several facilities reviewing for possible placement. This was all discussed with pt who is agreeable. TOC will continue to follow.    Expected Discharge Plan: Skilled Nursing Facility Barriers to Discharge: Insurance Authorization, Continued Medical Work up, SNF Pending bed offer  Expected Discharge Plan and Services In-house Referral: Clinical Social Work     Living arrangements for the past 2 months: Single Family Home Expected Discharge Date: 04/11/23                                     Social Determinants of Health (SDOH) Interventions SDOH Screenings   Food Insecurity: No Food Insecurity (04/07/2023)  Housing: Unknown (04/23/2023)  Recent Concern: Housing - Medium Risk (04/07/2023)  Transportation Needs: No Transportation Needs (04/07/2023)  Recent Concern: Transportation Needs - Unmet Transportation Needs (02/17/2023)   Received from  [facility]  Utilities: At Risk (04/07/2023)  Alcohol  Screen: Low Risk  (08/20/2022)  Financial Resource Strain: Patient Unable To Answer (08/20/2022)  Tobacco Use: Low Risk  (04/08/2023)    Readmission Risk Interventions    04/07/2023    2:34 PM  Readmission Risk Prevention Plan  Transportation Screening Complete  PCP or Specialist Appt within 3-5 Days Complete  HRI or Home Care Consult Complete  Social Work Consult for Recovery Care Planning/Counseling Complete  Palliative Care Screening Not Applicable  Medication Review [facility]) Complete

## 2023-06-16 NOTE — Plan of Care (Signed)
  Problem: Fluid Volume: Goal: Hemodynamic stability will improve Outcome: Progressing   Problem: Clinical Measurements: Goal: Signs and symptoms of infection will decrease Outcome: Progressing   Problem: Respiratory: Goal: Ability to maintain adequate ventilation will improve Outcome: Progressing   Problem: Activity: Goal: Risk for activity intolerance will decrease Outcome: Progressing   Problem: Nutrition: Goal: Adequate nutrition will be maintained Outcome: Progressing   Problem: Pain Management: Goal: General experience of comfort will improve Outcome: Progressing   Problem: Skin Integrity: Goal: Risk for impaired skin integrity will decrease Outcome: Progressing

## 2023-06-17 DIAGNOSIS — M86171 Other acute osteomyelitis, right ankle and foot: Secondary | ICD-10-CM | POA: Diagnosis not present

## 2023-06-17 DIAGNOSIS — I5042 Chronic combined systolic (congestive) and diastolic (congestive) heart failure: Secondary | ICD-10-CM | POA: Diagnosis not present

## 2023-06-17 DIAGNOSIS — Z89511 Acquired absence of right leg below knee: Secondary | ICD-10-CM | POA: Diagnosis not present

## 2023-06-17 NOTE — Progress Notes (Signed)
 PROGRESS NOTE    Alan Tapia.  FMW:[id number] DOB: 14-Nov-1975 DOA: 04/06/2023 PCP: Patient, No Pcp Per   Brief Narrative: 48 year old male with past medical history significant for left lower extremity BKA, dilated cardiomyopathy, history of combined systolic and diastolic heart failure with EF 35 to 40%, hypertension, insulin -dependent diabetes, peripheral neuropathy, hyperlipidemia who presented to the emergency department on 04/06/2023 with complaints of odor, wound of his foot. He was also having nausea, vomiting and diarrhea. Orthopedic surgery was consulted and patient underwent right BKA 11/27 by Dr. Harden.   Assessment and Plan:  Sepsis Present on admission, secondary to right foot osteomyelitis. Blood cultures with no growth.  Right foot osteomyelitis s/p right BKA Patient was initially treated empirically with antibiotics. Orthopedic surgery performed a right BKA on 11/27. Antibiotics discontinued.  CKD stage II Stable.  Chronic combined systolic and diastolic heart failure Stable. -Continue Toprol  XL and Entresto   Primary hypertension -Continue Toprol  XL and Entresto   Diarrhea Recurrent. Likely related to patient's aggressive bowel regimen. Improved after discontinuing bowel regimen.  Abdominal edema Mostly to left side of abdomen with resultant fluid filled striae and mild dermatitis. Patient declining diuresis at this time. Improved with changing positions.  Hemorrhoids Treated with Anusol  cream.  Community acquired pneumonia Patient treated with Zosyn  and doxycycline . Resolved.  Acute on chronic anemia Patient with hemoglobin drop down to 5.9 g/dL secondary to right foot wound, requiring 3 units of PRBC on 11/26. Patient requiring another 1 unit of PRBC on 11/30. Stable.  COVID-19 infection Patient completed isolation protocol. Patient treated with Paxlovid .  Constipation Holding bowel regimen secondary to worsening diarrhea.  Pressure  injury Bilateral perineum, unclear if present on admission.   DVT prophylaxis: Lovenox  Code Status:   Code Status: Full Code Family Communication: Mother at bedside Disposition Plan: Discharge home pending TOC ability to ensure safe discharge home   Consultants:  Orthopedic surgery  Procedures:  Right below knee amputation  Antimicrobials: Paxlovid  Vancomycin  Zosyn  Flagyl  Doxycycline  Cefepime    Subjective: Still with some abdominal pain. Patient contributes this to the food in the hospital. No nausea, vomiting. Passing gas and having bowel movements  Objective: BP (!) 149/75 (BP Location: Right Arm)   Pulse 72   Temp 98.3 F (36.8 C) (Oral)   Resp 16   Ht 6' 4 (1.93 m)   Wt 128.8 kg   SpO2 99%   BMI 34.56 kg/m   Examination:  General exam: Appears calm and comfortable Gastrointestinal system: Abdomen is nondistended, soft and nontender. Central nervous system: Alert and oriented. Psychiatry: Judgement and insight appear normal. Mood & affect appropriate.    Data Reviewed: I have personally reviewed following labs and imaging studies  CBC Lab Results  Component Value Date   WBC 4.7 06/03/2023   RBC 3.92 (L) 06/03/2023   HGB 10.1 (L) 06/03/2023   HCT 33.7 (L) 06/03/2023   MCV 86.0 06/03/2023   MCH 25.8 (L) 06/03/2023   PLT 296 06/03/2023   MCHC 30.0 06/03/2023   RDW 15.1 06/03/2023   LYMPHSABS 1.9 05/10/2023   MONOABS 0.5 05/10/2023   EOSABS 0.1 05/10/2023   BASOSABS 0.1 05/10/2023     Last metabolic panel Lab Results  Component Value Date   NA 134 (L) 06/03/2023   K 4.2 06/03/2023   CL 106 06/03/2023   CO2 23 06/03/2023   BUN 7 06/03/2023   CREATININE 0.96 06/03/2023   GLUCOSE 187 (H) 06/03/2023   GFRNONAA >60 06/03/2023   GFRAA >60 09/20/2016  CALCIUM  7.2 (L) 06/03/2023   PROT 5.3 (L) 05/20/2023   ALBUMIN  <1.5 (L) 05/20/2023   BILITOT 0.5 05/20/2023   ALKPHOS 182 (H) 05/20/2023   AST 12 (L) 05/20/2023   ALT 12 05/20/2023    ANIONGAP 5 06/03/2023    GFR: Estimated Creatinine Clearance: 137.9 mL/min (by C-G formula based on SCr of 0.96 mg/dL).  No results found for this or any previous visit (from the past 240 hours).    Radiology Studies: No results found.      LOS: 71 days    Elgin Lam, MD [facility] 06/17/2023, 9:43 AM   If 7PM-7AM, please contact night-coverage [email or web address]

## 2023-06-17 NOTE — Progress Notes (Signed)
 Physical Therapy Treatment Patient Details Name: Alan Tapia. MRN: [id number] DOB: [birth year] Today's Date: 06/17/2023   History of Present Illness The pt is a 48 yo male presenting 11/25 with nausea, vomiting, and fever as well as R foot wound. 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    PT Comments  Pt making functional gains this session, requiring less assist overall for transfers. Performed slide board transfer to wheelchair with min-mod assist (+2 safety) and posterior anterior transfer back to bed with +2 assist with BLE's and trunk. Plan to get prosthetics and continue work on strengthening and transfer training.    If plan is discharge home, recommend the following: Two people to help with walking and/or transfers;Assist for transportation;Assistance with cooking/housework;Help with stairs or ramp for entrance   Can travel by private vehicle     No  Equipment Recommendations  Hoyer lift;Wheelchair cushion (measurements PT);Wheelchair (measurements PT);Hospital bed    Recommendations for Other Services       Precautions / Restrictions Precautions Precautions: Fall Restrictions Weight Bearing Restrictions Per Provider Order: No     Mobility  Bed Mobility Overal bed mobility: Needs Assistance Bed Mobility: Rolling, Supine to Sit Rolling: Contact guard assist, Used rails   Supine to sit: Min assist     General bed mobility comments: Able to roll to R/L for peri care, removal/placement of pad and donning of shorts with use of rails. Pt maneuvering to right side of bed with HOB elevated and rail, minA to execute and pull up to complete sitting position    Transfers Overall transfer level: Needs assistance Equipment used: Sliding board, None Transfers: Bed to chair/wheelchair/BSC          Anterior-Posterior transfers: Mod assist, +2 physical assistance  Lateral/Scoot Transfers: Min assist, Mod assist, +2 safety/equipment General transfer comment: Pt performed slide board transfer from bed to chair towards right with assist for placement of slide board, min-modA to execute to fully slide hips back into wheelchair. Cues for head/hip relationship but pt with tendency to lean backwards. Posterior to anterior transfer back to bed with assist unweighting bilat residual limbs and facilitation at hips to scoot all the way forward back onto rail. Use of rails to reposition shoulders in the bed    Ambulation/Gait                   Stairs             Wheelchair Mobility     Tilt Bed    Modified Rankin (Stroke Patients Only)       Balance Overall balance assessment: Needs assistance Sitting-balance support: Feet unsupported Sitting balance-Leahy Scale: Fair                                      Cognition Arousal: Alert Behavior During Therapy: WFL for tasks assessed/performed Overall Cognitive Status: Within Functional Limits for tasks assessed                                          Exercises Amputee Exercises Hip ABduction/ADduction: Both, 5 reps, Supine Straight Leg Raises: Both, 5 reps, Supine Other  Exercises Other Exercises: supine: bilat SAQ's x 10 each    General Comments        Pertinent Vitals/Pain Pain Assessment Pain Assessment: Faces Faces Pain Scale: Hurts a little bit Pain Location: stomach Pain Descriptors / Indicators: Discomfort Pain Intervention(s): Monitored during session    Home Living                          Prior Function            PT Goals (current goals can now be found in the care plan section) Acute Rehab PT Goals Patient Stated Goal: get bil prosthetics Potential to Achieve Goals: Fair Progress towards PT goals: Progressing toward goals    Frequency    Min  1X/week      PT Plan      Co-evaluation PT/OT/SLP Co-Evaluation/Treatment: Yes Reason for Co-Treatment: For patient/therapist safety;To address functional/ADL transfers PT goals addressed during session: Mobility/safety with mobility        AM-PAC PT 6 Clicks Mobility   Outcome Measure  Help needed turning from your back to your side while in a flat bed without using bedrails?: A Little Help needed moving from lying on your back to sitting on the side of a flat bed without using bedrails?: A Little Help needed moving to and from a bed to a chair (including a wheelchair)?: A Lot Help needed standing up from a chair using your arms (e.g., wheelchair or bedside chair)?: Total Help needed to walk in hospital room?: Total Help needed climbing 3-5 steps with a railing? : Total 6 Click Score: 11    End of Session Equipment Utilized During Treatment: Gait belt Activity Tolerance: Patient tolerated treatment well Patient left: in bed;with call bell/phone within reach   PT Visit Diagnosis: Other abnormalities of gait and mobility (R26.89);Muscle weakness (generalized) (M62.81) Pain - Right/Left: Right Pain - part of body: Leg     Time: 1405-1440 PT Time Calculation (min) (ACUTE ONLY): 35 min  Charges:    $Therapeutic Activity: 8-22 mins PT General Charges $$ ACUTE PT VISIT: 1 Visit                     [name], PT, DPT Acute Rehabilitation Services Office [phone]    [name] 06/17/2023, 2:52 PM

## 2023-06-17 NOTE — Progress Notes (Signed)
 Occupational Therapy Treatment Patient Details Name: Alan Tapia. MRN: [id number] DOB: 1976/02/11 Today's Date: 06/17/2023   History of present illness The pt is a 48 yo male presenting 11/25 with nausea, vomiting, and fever as well as R foot wound. 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   OT comments  Patient seen with PT to address transfers to/from wheelchair. Patient participated in LB dressing with donning shorts prior to slide board transfer with mod assist. Patient requiring min assist with trunk to get to EOB. Patient required max assist with slide board placement and demonstrating improvement with slide board use with min/mod assist to complete due to hip extension.  Posterior to anterior transfer to bed with +2 assist due to assistance with BLEs and trunk. Patient to continue to be followed by acute OT to address established goals to facilitate DC to next venue of care.       If plan is discharge home, recommend the following:  A lot of help with walking and/or transfers;A lot of help with bathing/dressing/bathroom   Equipment Recommendations  Hoyer lift;Other (comment) ([place])    Recommendations for Other Services      Precautions / Restrictions Precautions Precautions: Fall Restrictions Weight Bearing Restrictions Per Provider Order: No       Mobility Bed Mobility Overal bed mobility: Needs Assistance Bed Mobility: Rolling, Supine to Sit Rolling: Contact guard assist, Used rails   Supine to sit: Min assist     General bed mobility comments: Able to roll to R/L for peri care, removal/placement of pad and donning of shorts with use of rails. Pt maneuvering to right side of bed with HOB elevated and rail, minA to execute and pull up to complete sitting position    Transfers Overall transfer  level: Needs assistance Equipment used: Sliding board, None Transfers: Bed to chair/wheelchair/BSC         Anterior-Posterior transfers: Mod assist, +2 physical assistance  Lateral/Scoot Transfers: Min assist, Mod assist, +2 safety/equipment General transfer comment: Pt performed slide board transfer from bed to chair towards right with assist for placement of slide board, min-modA to execute to fully slide hips back into wheelchair. Cues for head/hip relationship but pt with tendency to lean backwards. Posterior to anterior transfer back to bed with assist unweighting bilat residual limbs and facilitation at hips to scoot all the way forward back onto rail. Use of rails to reposition shoulders in the bed     Balance Overall balance assessment: Needs assistance Sitting-balance support: Feet unsupported Sitting balance-Leahy Scale: Fair                                     ADL either performed or assessed with clinical judgement   ADL Overall ADL's : Needs assistance/impaired                     Lower Body Dressing: Moderate assistance;Bed level Lower Body Dressing Details (indicate cue type and reason): patient performed rolling right  and left to donn and doff short with assistance to pull up and pull off hips                    Extremity/Trunk Assessment  Vision       Perception     Praxis      Cognition Arousal: Alert Behavior During Therapy: WFL for tasks assessed/performed Overall Cognitive Status: Within Functional Limits for tasks assessed                                          Exercises      Shoulder Instructions       General Comments VSS on RA    Pertinent Vitals/ Pain       Pain Assessment Pain Assessment: Faces Faces Pain Scale: Hurts a little bit Pain Location: stomach Pain Descriptors / Indicators: Discomfort Pain Intervention(s): Monitored during session  Home Living                                           Prior Functioning/Environment              Frequency  Min 1X/week        Progress Toward Goals  OT Goals(current goals can now be found in the care plan section)  Progress towards OT goals: Progressing toward goals  Acute Rehab OT Goals Patient Stated Goal: get new prosthesis OT Goal Formulation: With patient Time For Goal Achievement: 06/25/23 Potential to Achieve Goals: Good ADL Goals Pt Will Perform Grooming: with modified independence;sitting Pt Will Perform Upper Body Bathing: with set-up;sitting Pt Will Perform Upper Body Dressing: with modified independence;sitting Pt Will Perform Lower Body Dressing: with min assist;sitting/lateral leans;bed level Pt Will Transfer to Toilet: with mod assist;with +2 assist;anterior/posterior transfer;bedside commode Pt Will Perform Toileting - Clothing Manipulation and hygiene: with min assist;sitting/lateral leans;bed level Pt/caregiver will Perform Home Exercise Program: Increased strength;Both right and left upper extremity;Independently;With written HEP provided Additional ADL Goal #1: Pt to don B shrinkers Independently Additional ADL Goal #2: Caregiver to demonstrate safest way to assist pt with ADLs/transfers to decrease fall risk at home  Plan      Co-evaluation    PT/OT/SLP Co-Evaluation/Treatment: Yes Reason for Co-Treatment: For patient/therapist safety;To address functional/ADL transfers PT goals addressed during session: Mobility/safety with mobility OT goals addressed during session: ADL's and self-care;Strengthening/ROM      AM-PAC OT 6 Clicks Daily Activity     Outcome Measure   Help from another person eating meals?: A Little Help from another person taking care of personal grooming?: A Little Help from another person toileting, which includes using toliet, bedpan, or urinal?: A Lot Help from another person bathing (including washing, rinsing, drying)?: A  Lot Help from another person to put on and taking off regular upper body clothing?: A Little Help from another person to put on and taking off regular lower body clothing?: A Lot 6 Click Score: 15    End of Session Equipment Utilized During Treatment: Gait belt;Other (comment) (sliding board)  OT Visit Diagnosis: Other abnormalities of gait and mobility (R26.89);Muscle weakness (generalized) (M62.81);Other (comment)   Activity Tolerance Patient tolerated treatment well   Patient Left in bed;with call bell/phone within reach   Nurse Communication Mobility status        Time: 8596-8561 OT Time Calculation (min): 35 min  Charges: OT General Charges $OT Visit: 1 Visit OT Treatments $Self Care/Home Management : 8-22 mins  Dick Laine, OTA Acute Rehabilitation Services  Office [phone]  Jeb LITTIE Laine 06/17/2023, 2:59 PM

## 2023-06-17 NOTE — Social Work (Addendum)
 CSW followed back up with [facility] and [facility] to determine if they are able to accept pt. Both facilities are still reviewing. [facility] continues to be, unable to confirm Disability. CSW emailed Medicaid case worker, Kayla again, to ask for physical documentation of Disability. She had stated this had been available since Jan 21st.  CSW received a chat from CIR rep stating that spouse had called to see if pt can go to CIR. CIR rep and CSW discussed case and CIR rep reviewed extensively. At this time pt doesn't meet medical criteria, but also does not have a firm DC plan in place for after CIR. CSW advised CIR of pt's request for team to speak directly to him, not spouse, CSW noted that CSW will update pt at bedside.  CSW met with pt at bedside and advised of CIR denial. Difference between CIR and SNF explained and pt still wants to continue on to SNF. CSW confirmed with pt that team update him directly and he can update spouse as he feels appropriate. Pt confirms. Pt notes that [facility] should be bringing his prosthetics within the next few weeks, so is excited to start rehab. Pt also beginning to think about plans for after rehab including working again.  CSW followed up with DSS worker, no updates at this time. TOC continues to follow for DC planning at this time.  Referrals sent to [facility]- Declined due to social issues [facility]- reviewing [facility] - Reviewing [facility]- Reviewing [facility]

## 2023-06-17 NOTE — Progress Notes (Signed)
 Inpatient Rehab Admissions Coordinator:   Received a voicemail from Alan Tapia's spouse on rehab line requesting placement for Alan Tapia.  Chart reviewed extensively.  Spoke with TOC who has requested we not contact the spouse at this time.  Alan Tapia is not a candidate for acute inpatient rehabilitation.  Alan Tapia does not meet AIR criteria as he does not have the medical necessity to be followed daily by a rehab physician, he is not participating with therapy consistently, and has made limited functional progress during his 71 day hospital stay.  Also note significant concerns with safe dispo and caregiver support.  He is most appropriate for SNF at this time, in line with therapy recommendations.  Also note barriers to SNF.  We will not follow this patient.   Reche Lowers, Alan Tapia, DPT Admissions Coordinator [phone] 06/17/23  2:56 PM

## 2023-06-18 ENCOUNTER — Ambulatory Visit: Payer: No Typology Code available for payment source | Admitting: Orthopedic Surgery

## 2023-06-18 DIAGNOSIS — N179 Acute kidney failure, unspecified: Secondary | ICD-10-CM | POA: Diagnosis not present

## 2023-06-18 DIAGNOSIS — A419 Sepsis, unspecified organism: Secondary | ICD-10-CM | POA: Diagnosis not present

## 2023-06-18 DIAGNOSIS — R6520 Severe sepsis without septic shock: Secondary | ICD-10-CM | POA: Diagnosis not present

## 2023-06-18 NOTE — Progress Notes (Signed)
 PROGRESS NOTE    Alan Tapia.  FMW:[id number] DOB: March 05, 1976 DOA: 04/06/2023 PCP: Patient, No Pcp Per   Brief Narrative: 48 year old male with past medical history significant for left lower extremity BKA, dilated cardiomyopathy, history of combined systolic and diastolic heart failure with EF 35 to 40%, hypertension, insulin -dependent diabetes, peripheral neuropathy, hyperlipidemia who presented to the emergency department on 04/06/2023 with complaints of odor, wound of his foot. He was also having nausea, vomiting and diarrhea. Orthopedic surgery was consulted and patient underwent right BKA 11/27 by Dr. Harden.   Assessment and Plan:  Sepsis Present on admission, secondary to right foot osteomyelitis. Blood cultures with no growth.  Right foot osteomyelitis s/p right BKA Patient was initially treated empirically with antibiotics. Orthopedic surgery performed a right BKA on 11/27. Antibiotics discontinued. --need SNF  Hx of left BKA --need SNF  CKD stage II Stable.  Chronic combined systolic and diastolic heart failure Stable. -Continue Toprol  XL and Entresto   Primary hypertension -Continue Toprol  XL and Entresto  and amlodipine   Diarrhea Recurrent. Likely related to patient's aggressive bowel regimen. Improved after discontinuing bowel regimen.  Abdominal edema Mostly to left side of abdomen with resultant fluid filled striae and mild dermatitis. Patient declining diuresis at this time. Improved with changing positions.  Hemorrhoids Treated with Anusol  cream.  Community acquired pneumonia Patient treated with Zosyn  and doxycycline . Resolved.  Acute on chronic anemia Patient with hemoglobin drop down to 5.9 g/dL secondary to right foot wound, requiring 3 units of PRBC on 11/26. Patient requiring another 1 unit of PRBC on 11/30. Hgb now stable around 9-10's.  COVID-19 infection Patient completed isolation protocol. Patient treated with  Paxlovid .  Constipation Holding bowel regimen secondary to worsening diarrhea.  Pressure injury Bilateral perineum, unclear if present on admission.   DVT prophylaxis: Lovenox  Code Status:   Code Status: Full Code Family Communication:  Disposition Plan: TOC looking for SNF   Consultants:  Orthopedic surgery  Procedures:  Right below knee amputation  Antimicrobials: Paxlovid  Vancomycin  Zosyn  Flagyl  Doxycycline  Cefepime    Subjective: Pt reported good oral intake, BM's.  Objective: BP (!) 150/76 (BP Location: Right Arm)   Pulse 72   Temp 98.1 F (36.7 C) (Oral)   Resp 17   Ht 6' 4 (1.93 m)   Wt 126.8 kg   SpO2 95%   BMI 34.03 kg/m   Examination:  Constitutional: NAD, AAOx3 HEENT: conjunctivae and lids normal, EOMI CV: No cyanosis.   RESP: normal respiratory effort, on RA Neuro: II - XII grossly intact.   Psych: Normal mood and affect.  Appropriate judgement and reason   Data Reviewed: I have personally reviewed following labs and imaging studies  CBC Lab Results  Component Value Date   WBC 4.7 06/03/2023   RBC 3.92 (L) 06/03/2023   HGB 10.1 (L) 06/03/2023   HCT 33.7 (L) 06/03/2023   MCV 86.0 06/03/2023   MCH 25.8 (L) 06/03/2023   PLT 296 06/03/2023   MCHC 30.0 06/03/2023   RDW 15.1 06/03/2023   LYMPHSABS 1.9 05/10/2023   MONOABS 0.5 05/10/2023   EOSABS 0.1 05/10/2023   BASOSABS 0.1 05/10/2023     Last metabolic panel Lab Results  Component Value Date   NA 134 (L) 06/03/2023   K 4.2 06/03/2023   CL 106 06/03/2023   CO2 23 06/03/2023   BUN 7 06/03/2023   CREATININE 0.96 06/03/2023   GLUCOSE 187 (H) 06/03/2023   GFRNONAA >60 06/03/2023   GFRAA >60 09/20/2016   CALCIUM  7.2 (L)  06/03/2023   PROT 5.3 (L) 05/20/2023   ALBUMIN  <1.5 (L) 05/20/2023   BILITOT 0.5 05/20/2023   ALKPHOS 182 (H) 05/20/2023   AST 12 (L) 05/20/2023   ALT 12 05/20/2023   ANIONGAP 5 06/03/2023    GFR: Estimated Creatinine Clearance: 136.8 mL/min (by C-G  formula based on SCr of 0.96 mg/dL).  No results found for this or any previous visit (from the past 240 hours).    Radiology Studies: No results found.      LOS: 72 days

## 2023-06-18 NOTE — Progress Notes (Signed)
 Physical Therapy Treatment Patient Details Name: Alan Tapia. MRN: [id number] DOB: [birth year] Today's Date: 06/18/2023   History of Present Illness The pt is a 48 yo male presenting 11/25 with nausea, vomiting, and fever as well as R foot wound. 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    PT Comments  Pt seen for PT tx with pt agreeable. PT encouraged pt to transfer to recliner via A/P method but pt not willing as he reports almost falling while transferring this way in the past. PT encouraged pt to transfer to recliner daily but pt reports it's uncomfortable. Pt transitioned to long sitting & engaged in balloon taps with focus on core strengthening & balance with pt tolerating ~8 minutes; pt notes increasing back pain with activity. PT educated pt on desensitization techniques as pt endorses phantom limb pain, PT also encouraged pt to lie on stomach to stretch hip flexors with pt reporting he lies prone some. Will continue to follow pt acutely to progress mobility as able.    If plan is discharge home, recommend the following: Two people to help with walking and/or transfers;Assist for transportation;Assistance with cooking/housework;Help with stairs or ramp for entrance   Can travel by private vehicle     No  Equipment Recommendations  Hoyer lift;Wheelchair cushion (measurements PT);Wheelchair (measurements PT);Hospital bed    Recommendations for Other Services       Precautions / Restrictions Precautions Precautions: Fall Precaution Comments: RLE limb protector for OOB mobility Restrictions Weight Bearing Restrictions Per Provider Order: No RLE Weight Bearing Per Provider Order: Non weight bearing     Mobility  Bed Mobility               General bed mobility comments: Pt able to transition semi fowler to  long sitting with mod I with use of bed rails.    Transfers                        Ambulation/Gait                   Stairs             Wheelchair Mobility     Tilt Bed    Modified Rankin (Stroke Patients Only)       Balance                                            Cognition Arousal: Alert Behavior During Therapy: WFL for tasks assessed/performed Overall Cognitive Status: Within Functional Limits for tasks assessed                                 General Comments: Pt requires encoruagement to attempt tasks even when struggling. Pt not comfortable with attempting A/P transfer to recliner with 1 person assist despite PT educating him on safety of this; pt unwilling 2/2 hx of fall.        Exercises      General Comments        Pertinent Vitals/Pain Pain Assessment Pain Assessment: Faces Faces Pain Scale: Hurts even more Pain Location: back Pain Descriptors / Indicators:  Discomfort Pain Intervention(s): Monitored during session, Limited activity within patient's tolerance    Home Living                          Prior Function            PT Goals (current goals can now be found in the care plan section) Acute Rehab PT Goals Patient Stated Goal: get bil prosthetics PT Goal Formulation: With patient Time For Goal Achievement: 06/25/23 Potential to Achieve Goals: Fair Additional Goals Additional Goal #1: Pt will propel W/C for 50 ft at Min A with Total A for w/c set up Progress towards PT goals: Progressing toward goals    Frequency    Min 1X/week      PT Plan      Co-evaluation              AM-PAC PT 6 Clicks Mobility   Outcome Measure  Help needed turning from your back to your side while in a flat bed without using bedrails?: A Little Help needed moving from lying on your back to sitting on the side of a flat bed without using bedrails?: A Little Help needed  moving to and from a bed to a chair (including a wheelchair)?: A Lot Help needed standing up from a chair using your arms (e.g., wheelchair or bedside chair)?: Total Help needed to walk in hospital room?: Total Help needed climbing 3-5 steps with a railing? : Total 6 Click Score: 11    End of Session   Activity Tolerance: Patient tolerated treatment well Patient left: in bed;with call bell/phone within reach;with bed alarm set   PT Visit Diagnosis: Other abnormalities of gait and mobility (R26.89);Muscle weakness (generalized) (M62.81);Pain     Time: 1440-1457 PT Time Calculation (min) (ACUTE ONLY): 17 min  Charges:    $Therapeutic Activity: 8-22 mins PT General Charges $$ ACUTE PT VISIT: 1 Visit                     Richerd Pinal, PT, DPT 06/18/23, 3:05 PM   Richerd CHRISTELLA Pinal 06/18/2023, 3:03 PM

## 2023-06-18 NOTE — Plan of Care (Signed)
 Problem: Fluid Volume: Goal: Hemodynamic stability will improve Outcome: Progressing   Problem: Clinical Measurements: Goal: Diagnostic test results will improve Outcome: Progressing Goal: Signs and symptoms of infection will decrease Outcome: Progressing   Problem: Respiratory: Goal: Ability to maintain adequate ventilation will improve Outcome: Progressing   Problem: Education: Goal: Knowledge of General Education information will improve Description: Including pain rating scale, medication(s)/side effects and non-pharmacologic comfort measures Outcome: Progressing   Problem: Health Behavior/Discharge Planning: Goal: Ability to manage health-related needs will improve Outcome: Progressing   Problem: Clinical Measurements: Goal: Ability to maintain clinical measurements within normal limits will improve Outcome: Progressing Goal: Will remain free from infection Outcome: Progressing Goal: Diagnostic test results will improve Outcome: Progressing Goal: Respiratory complications will improve Outcome: Progressing Goal: Cardiovascular complication will be avoided Outcome: Progressing   Problem: Activity: Goal: Risk for activity intolerance will decrease Outcome: Progressing   Problem: Nutrition: Goal: Adequate nutrition will be maintained Outcome: Progressing   Problem: Coping: Goal: Level of anxiety will decrease Outcome: Progressing   Problem: Elimination: Goal: Will not experience complications related to bowel motility Outcome: Progressing Goal: Will not experience complications related to urinary retention Outcome: Progressing   Problem: Pain Management: Goal: General experience of comfort will improve Outcome: Progressing   Problem: Safety: Goal: Ability to remain free from injury will improve Outcome: Progressing   Problem: Skin Integrity: Goal: Risk for impaired skin integrity will decrease Outcome: Progressing   Problem: Activity: Goal: Ability  to tolerate increased activity will improve Outcome: Progressing   Problem: Clinical Measurements: Goal: Ability to maintain a body temperature in the normal range will improve Outcome: Progressing   Problem: Respiratory: Goal: Ability to maintain adequate ventilation will improve Outcome: Progressing Goal: Ability to maintain a clear airway will improve Outcome: Progressing   Problem: Education: Goal: Knowledge of the prescribed therapeutic regimen will improve Outcome: Progressing Goal: Ability to verbalize activity precautions or restrictions will improve Outcome: Progressing Goal: Understanding of discharge needs will improve Outcome: Progressing   Problem: Activity: Goal: Ability to perform//tolerate increased activity and mobilize with assistive devices will improve Outcome: Progressing   Problem: Clinical Measurements: Goal: Postoperative complications will be avoided or minimized Outcome: Progressing   Problem: Self-Care: Goal: Ability to meet self-care needs will improve Outcome: Progressing   Problem: Self-Concept: Goal: Ability to maintain and perform role responsibilities to the fullest extent possible will improve Outcome: Progressing   Problem: Education: Goal: Knowledge of General Education information will improve Description: Including pain rating scale, medication(s)/side effects and non-pharmacologic comfort measures Outcome: Progressing   Problem: Health Behavior/Discharge Planning: Goal: Ability to manage health-related needs will improve Outcome: Progressing   Problem: Clinical Measurements: Goal: Ability to maintain clinical measurements within normal limits will improve Outcome: Progressing Goal: Will remain free from infection Outcome: Progressing Goal: Diagnostic test results will improve Outcome: Progressing Goal: Respiratory complications will improve Outcome: Progressing Goal: Cardiovascular complication will be avoided Outcome:  Progressing   Problem: Activity: Goal: Risk for activity intolerance will decrease Outcome: Progressing   Problem: Nutrition: Goal: Adequate nutrition will be maintained Outcome: Progressing   Problem: Coping: Goal: Level of anxiety will decrease Outcome: Progressing   Problem: Elimination: Goal: Will not experience complications related to bowel motility Outcome: Progressing Goal: Will not experience complications related to urinary retention Outcome: Progressing   Problem: Pain Management: Goal: General experience of comfort will improve Outcome: Progressing   Problem: Safety: Goal: Ability to remain free from injury will improve Outcome: Progressing   Problem: Skin Integrity: Goal: Risk for  impaired skin integrity will decrease Outcome: Progressing

## 2023-06-18 NOTE — TOC Progression Note (Addendum)
 Transition of Care (TOC) - Progression Note    Patient Details  Name: Alan Tapia. MRN: [id number] Date of Birth: 03-19-1976  Transition of Care [facility]) CM/SW Contact  Harlene Sharps, LCSW Phone Number: 06/18/2023, 2:35 PM  Clinical Narrative:    DSS has not been able to provide proof of or confirm disability at this time. CSW continuing to work with facilities and attempting to find Placement. [facility] has declined at this time, [facility] reviewing. CSW left VM for [facility] and [facility] as they have offered in the hub, will confirm acceptance with facilities as pt is a difficult case. TOC will continue to follow.   3:50 [place] discussing with corporate. Will talk to them on Monday.  Expected Discharge Plan: Skilled Nursing Facility Barriers to Discharge: Insurance Authorization, Continued Medical Work up, SNF Pending bed offer  Expected Discharge Plan and Services In-house Referral: Clinical Social Work     Living arrangements for the past 2 months: Single Family Home Expected Discharge Date: 04/11/23                                     Social Determinants of Health (SDOH) Interventions SDOH Screenings   Food Insecurity: No Food Insecurity (04/07/2023)  Housing: Unknown (04/23/2023)  Recent Concern: Housing - Medium Risk (04/07/2023)  Transportation Needs: No Transportation Needs (04/07/2023)  Recent Concern: Transportation Needs - Unmet Transportation Needs (02/17/2023)   Received from [facility]  Utilities: At Risk (04/07/2023)  Alcohol  Screen: Low Risk  (08/20/2022)  Financial Resource Strain: Patient Unable To Answer (08/20/2022)  Tobacco Use: Low Risk  (04/08/2023)    Readmission Risk Interventions    04/07/2023    2:34 PM  Readmission Risk Prevention Plan  Transportation Screening Complete  PCP or Specialist Appt within 3-5 Days Complete  HRI or Home Care Consult Complete  Social Work Consult for Recovery Care Planning/Counseling Complete  Palliative  Care Screening Not Applicable  Medication Review Oceanographer) Complete

## 2023-06-19 ENCOUNTER — Inpatient Hospital Stay (HOSPITAL_COMMUNITY): Payer: Medicaid Other

## 2023-06-19 DIAGNOSIS — R6520 Severe sepsis without septic shock: Secondary | ICD-10-CM | POA: Diagnosis not present

## 2023-06-19 DIAGNOSIS — N179 Acute kidney failure, unspecified: Secondary | ICD-10-CM | POA: Diagnosis not present

## 2023-06-19 DIAGNOSIS — A419 Sepsis, unspecified organism: Secondary | ICD-10-CM | POA: Diagnosis not present

## 2023-06-19 LAB — BASIC METABOLIC PANEL
Anion gap: 7 (ref 5–15)
BUN: 6 mg/dL (ref 6–20)
CO2: 22 mmol/L (ref 22–32)
Calcium: 7.2 mg/dL — ABNORMAL LOW (ref 8.9–10.3)
Chloride: 106 mmol/L (ref 98–111)
Creatinine, Ser: 0.86 mg/dL (ref 0.61–1.24)
GFR, Estimated: >60 mL/min (ref >60)
Glucose, Bld: 225 mg/dL — ABNORMAL HIGH (ref 70–99)
Potassium: 4.2 mmol/L (ref 3.5–5.1)
Sodium: 135 mmol/L (ref 135–145)

## 2023-06-19 LAB — CBC
HCT: 34.6 % — ABNORMAL LOW (ref 39.0–52.0)
Hemoglobin: 10.5 g/dL — ABNORMAL LOW (ref 13.0–17.0)
MCH: 25.1 pg — ABNORMAL LOW (ref 26.0–34.0)
MCHC: 30.3 g/dL (ref 30.0–36.0)
MCV: 82.8 fL (ref 80.0–100.0)
Platelets: 322 10*3/uL (ref 150–400)
RBC: 4.18 MIL/uL — ABNORMAL LOW (ref 4.22–5.81)
RDW: 14.1 % (ref 11.5–15.5)
WBC: 5.3 10*3/uL (ref 4.0–10.5)
nRBC: 0 % (ref 0.0–0.2)

## 2023-06-19 LAB — MAGNESIUM: Magnesium: 1.7 mg/dL (ref 1.7–2.4)

## 2023-06-19 LAB — GLUCOSE, CAPILLARY: Glucose-Capillary: 226 mg/dL — ABNORMAL HIGH (ref 70–99)

## 2023-06-19 MED ORDER — INSULIN ASPART 100 UNIT/ML IJ SOLN
0.0000 [IU] | Freq: Every day | INTRAMUSCULAR | Status: DC
  Administered 2023-06-19: 2 [IU] via SUBCUTANEOUS

## 2023-06-19 MED ORDER — MAGNESIUM OXIDE -MG SUPPLEMENT 400 (240 MG) MG PO TABS
400.0000 mg | ORAL_TABLET | Freq: Two times a day (BID) | ORAL | Status: AC
  Administered 2023-06-19 (×2): 400 mg via ORAL
  Filled 2023-06-19 (×2): qty 1

## 2023-06-19 MED ORDER — INSULIN ASPART 100 UNIT/ML IJ SOLN
0.0000 [IU] | Freq: Three times a day (TID) | INTRAMUSCULAR | Status: DC
  Administered 2023-06-20: 2 [IU] via SUBCUTANEOUS
  Administered 2023-06-20: 1 [IU] via SUBCUTANEOUS
  Administered 2023-06-21: 2 [IU] via SUBCUTANEOUS
  Administered 2023-06-21 – 2023-06-22 (×2): 1 [IU] via SUBCUTANEOUS

## 2023-06-19 MED ORDER — MAGNESIUM SULFATE 2 GM/50ML IV SOLN
2.0000 g | Freq: Once | INTRAVENOUS | Status: DC
Start: 2023-06-19 — End: 2023-06-19

## 2023-06-19 MED ORDER — HYDROMORPHONE HCL 1 MG/ML IJ SOLN
0.5000 mg | Freq: Once | INTRAMUSCULAR | Status: DC
  Filled 2023-06-19: qty 0.5

## 2023-06-19 NOTE — Progress Notes (Signed)
 On attending the call ,walked  to the patient's  room and was found lying on the floor in prone position.Called the Nurse tech near by, called other RN and charge nurse and assisted him to the bed via hoyer lift. Patient stated that he was trying to change position when he fell .Patient also stated that he did not hit his head but complained pain on his right rib.  Vital signs stable and did an assessment of patient and no visual injury noticed. Notified the provider and X-Ray was ordered and instructed to monitor the patient for any changes.His PRN Oxycodone  15mg  was given.

## 2023-06-19 NOTE — TOC Progression Note (Signed)
 Transition of Care (TOC) - Progression Note    Patient Details  Name: Alan Tapia. MRN: [id number] Date of Birth: 22-Dec-1975  Transition of Care [facility]) CM/SW Contact  [name], [name] Phone Number: 06/19/2023, 10:50 AM  Clinical Narrative:   CSW received call from [facility] that they attempted to run the patient's BCBS but it does not appear to be active at this time. CSW to follow.    Expected Discharge Plan: Skilled Nursing Facility Barriers to Discharge: Insurance Authorization, Continued Medical Work up, SNF Pending bed offer  Expected Discharge Plan and Services In-house Referral: Clinical Social Work     Living arrangements for the past 2 months: Single Family Home Expected Discharge Date: 04/11/23                                     Social Determinants of Health (SDOH) Interventions SDOH Screenings   Food Insecurity: No Food Insecurity (04/07/2023)  Housing: Unknown (04/23/2023)  Recent Concern: Housing - Medium Risk (04/07/2023)  Transportation Needs: No Transportation Needs (04/07/2023)  Recent Concern: Transportation Needs - Unmet Transportation Needs (02/17/2023)   Received from [facility]  Utilities: At Risk (04/07/2023)  Alcohol  Screen: Low Risk  (08/20/2022)  Financial Resource Strain: Patient Unable To Answer (08/20/2022)  Tobacco Use: Low Risk  (04/08/2023)    Readmission Risk Interventions    04/07/2023    2:34 PM  Readmission Risk Prevention Plan  Transportation Screening Complete  PCP or Specialist Appt within 3-5 Days Complete  HRI or Home Care Consult Complete  Social Work Consult for Recovery Care Planning/Counseling Complete  Palliative Care Screening Not Applicable  Medication Review Oceanographer) Complete

## 2023-06-19 NOTE — Progress Notes (Signed)
 PROGRESS NOTE  [name]  DOB: March 18, 1976  PCP: Patient, No Pcp Per FMW:[id number]  DOA: 04/06/2023  LOS: 73 days  Hospital Day: 75  Brief narrative: Estevon Fluke. is a 48 y.o. male with PMH significant for DM2, HTN, HLD, combined CHF with EF 30%, DCM, peripheral neuropathy, left BKA status. 04/06/2023, patient presented to the ED with complaint of order from his right foot wound.  Admitted to [facility] for sepsis secondary to right foot osteomyelitis 11/27, underwent right BKA by Dr. Harden.  Subjective: Patient was seen and examined this afternoon. Pleasant middle-aged African-American male.  Sitting up in bed.  Not in distress. Chart reviewed.  No fever, blood pressure 150s Noted episode of fall to the floor last night, impacted right side of the chest. Chest x-ray this morning without fractures.  Assessment and plan: Sepsis secondary to Present on admission, secondary to right foot osteomyelitis. Blood cultures with no growth.   Sepsis POA  right foot osteomyelitis s/p right BKA H/o left BKA Patient was initially treated empirically with antibiotics. Orthopedic surgery performed a right BKA on 11/27. Antibiotics discontinued. PT recommended SNF   Chronic combined systolic and diastolic heart failure Essential hypertension Last echo 1/3 with EF 30% Continue Toprol  XL, amlodipine  and Entresto  Net IO Since Admission: -83,351.49 mL [06/19/23 1720] Continue to monitor for daily intake output, weight, blood pressure, BNP, renal function and electrolytes. Recent Labs  Lab 06/19/23 0517  BUN 6  CREATININE 0.86  NA 135  K 4.2  MG 1.7    Type 2 diabetes mellitus A1c 6.8 in November 2024 PTA meds-not on insulin  Blood sugar level in labs this morning was elevated to over 225. Start SSI Accu-Cheks.  If fingersticks consistently under 200, can stop doing it. No results for input(s): GLUCAP in the last 168 hours.  Recurrent diarrhea Likely due to aggressive bowel  regimen.  Diarrhea improved after bowel regimen was stopped.   Abdominal edema Mostly to left side of abdomen with resultant fluid filled striae and mild dermatitis.  Patient declining diuresis at this time. Improved with changing positions.   Hemorrhoids Treated with Anusol  cream.   Community acquired pneumonia Patient treated with Zosyn  and doxycycline . Resolved.   Acute on chronic anemia Patient with hemoglobin drop down to 5.9 g/dL secondary to right foot wound, requiring 3 units of PRBC on 11/26. Patient requiring another 1 unit of PRBC on 11/30. Hgb now stable around 9-10's.   COVID-19 infection Patient completed isolation protocol. Patient treated with Paxlovid .   Constipation Holding bowel regimen secondary to worsening diarrhea.   Pressure injury Bilateral perineum, unclear if present on admission.   Fall 2/6, fell out of bed.  No obvious injury.  Chest x-ray without any evidence of rib fracture.    Mobility: Limited due to bilateral BKA status  Goals of care   Code Status: Full Code     DVT prophylaxis: Lovenox  subcu    Antimicrobials: None currently Fluid: None Consultants: None Family Communication: None at bedside  Status: Inpatient Level of care:  Med-Surg   Patient is from: Home Needs to continue in-hospital care: Pending SNF Anticipated d/c to: Pending clinical course and SNF    Diet:  Diet Order             Diet regular Room service appropriate? Yes; Fluid consistency: Thin  Diet effective now           Diet - low sodium heart healthy  Scheduled Meds:  amLODipine   5 mg Oral Daily   enoxaparin  (LOVENOX ) injection  60 mg Subcutaneous Daily   feeding supplement  237 mL Oral BID BM   Gerhardt's butt cream   Topical BID    HYDROmorphone  (DILAUDID ) injection  0.5 mg Intravenous Once   insulin  aspart  0-5 Units Subcutaneous QHS   [START ON 06/20/2023] insulin  aspart  0-9 Units Subcutaneous TID WC   lidocaine   2 patch  Transdermal Q24H   magnesium  oxide  400 mg Oral BID   melatonin  5 mg Oral QHS   metoprolol  succinate  200 mg Oral Daily   pantoprazole   40 mg Oral BID   sacubitril -valsartan   1 tablet Oral BID    PRN meds: acetaminophen , cloNIDine , dextromethorphan -guaiFENesin , diclofenac  Sodium, hydrOXYzine , methocarbamol , ondansetron  **OR** ondansetron  (ZOFRAN ) IV, mouth rinse, oxyCODONE , oxyCODONE , phenol, polyethylene glycol, simethicone    Infusions:    Antimicrobials: Anti-infectives (From admission, onward)    Start     Dose/Rate Route Frequency Ordered Stop   04/30/23 1800  nirmatrelvir /ritonavir  (PAXLOVID ) 3 tablet        3 tablet Oral 2 times daily 04/30/23 1621 05/05/23 0820   04/09/23 1345  doxycycline  (VIBRAMYCIN ) 100 mg in dextrose  5 % 250 mL IVPB        100 mg 125 mL/hr over 120 Minutes Intravenous Every 12 hours 04/09/23 1252 04/09/23 1746   04/08/23 1300  levofloxacin  (LEVAQUIN ) IVPB 500 mg  Status:  Discontinued        500 mg 100 mL/hr over 60 Minutes Intravenous On call to O.R. 04/08/23 1229 04/08/23 1648   04/08/23 1300  vancomycin  (VANCOREADY) IVPB 1500 mg/300 mL        1,500 mg 100 mL/hr over 180 Minutes Intravenous On call to O.R. 04/08/23 1229 04/08/23 1433   04/08/23 1237  vancomycin  HCl (VANCOREADY) 1500 MG/300ML IVPB       Note to Pharmacy: Barron Friday D: cabinet override      04/08/23 1237 04/08/23 1439   04/07/23 0800  piperacillin -tazobactam (ZOSYN ) IVPB 3.375 g        3.375 g 12.5 mL/hr over 240 Minutes Intravenous Every 8 hours 04/07/23 0108 04/09/23 2108   04/07/23 0100  doxycycline  (VIBRAMYCIN ) 100 mg in dextrose  5 % 250 mL IVPB  Status:  Discontinued        100 mg 125 mL/hr over 120 Minutes Intravenous Every 12 hours 04/07/23 0057 04/09/23 1252   04/07/23 0000  ceFEPIme  (MAXIPIME ) 2 g in sodium chloride  0.9 % 100 mL IVPB  Status:  Discontinued        2 g 200 mL/hr over 30 Minutes Intravenous Every 8 hours 04/06/23 2349 04/07/23 0108   04/06/23 2330   metroNIDAZOLE  (FLAGYL ) IVPB 500 mg        500 mg 100 mL/hr over 60 Minutes Intravenous  Once 04/06/23 2321 04/07/23 0032   04/06/23 2300  piperacillin -tazobactam (ZOSYN ) IVPB 3.375 g  Status:  Discontinued        3.375 g 100 mL/hr over 30 Minutes Intravenous  Once 04/06/23 2257 04/06/23 2320   04/06/23 2300  vancomycin  (VANCOREADY) IVPB 2000 mg/400 mL  Status:  Discontinued        2,000 mg 200 mL/hr over 120 Minutes Intravenous  Once 04/06/23 2257 04/06/23 2312       Objective: Vitals:   06/19/23 0727 06/19/23 1654  BP: (!) 155/77 (!) 139/90  Pulse: 66 80  Resp: 16 16  Temp: 98.2 F (36.8 C) 98.1 F (36.7 C)  SpO2:  98% 100%    Intake/Output Summary (Last 24 hours) at 06/19/2023 1720 Last data filed at 06/18/2023 1900 Gross per 24 hour  Intake --  Output 0 ml  Net 0 ml   Filed Weights   06/13/23 0447 06/18/23 0451 06/19/23 0559  Weight: 128.8 kg 126.8 kg 126.1 kg   Weight change: -0.7 kg Body mass index is 33.84 kg/m.   Physical Exam: General exam: Pleasant, middle-aged African-American male Skin: No rashes, lesions or ulcers. HEENT: Atraumatic, normocephalic, no obvious bleeding Lungs: Clear to auscultation bilaterally, no tenderness on palpation CVS: S1, S2, no murmur,   GI/Abd: Soft, nontender, nondistended, bowel sound present,   CNS: Alert, awake, oriented x 3 Psychiatry: Mood appropriate,  Extremities: Bilateral BKA status  Data Review: I have personally reviewed the laboratory data and studies available.  F/u labs  Unresulted Labs (From admission, onward)    None      Total time spent in review of labs and imaging, patient evaluation, formulation of plan, documentation and communication with family: 45 minutes  Signed, Chapman Rota, MD [facility] 06/19/2023

## 2023-06-19 NOTE — Progress Notes (Signed)
 Patient refused Xray, education provided.

## 2023-06-19 NOTE — Plan of Care (Signed)
  Problem: Health Behavior/Discharge Planning: Goal: Ability to manage health-related needs will improve Outcome: Progressing   Problem: Clinical Measurements: Goal: Will remain free from infection Outcome: Progressing   Problem: Pain Management: Goal: General experience of comfort will improve Outcome: Progressing   Problem: Education: Goal: Knowledge of General Education information will improve Description: Including pain rating scale, medication(s)/side effects and non-pharmacologic comfort measures Outcome: Progressing

## 2023-06-19 NOTE — Progress Notes (Signed)
    Patient Name: [name].           DOB: May 01, 1976  MRN: [id number]      Admission Date: 04/06/2023  Attending Provider: Awanda City, MD  Primary Diagnosis: Sepsis [facility])   Level of care: Med-Surg    CROSS COVER NOTE   Date of Service   06/19/2023   [name], 48 y.o. male, was admitted on 04/06/2023 for Sepsis [facility]).    HPI/Events of Note   Unwitnessed fall Fall occurred while patient was attempting to reposition in bed. He was found in prone position on floor, alert.  Patient denies injury to head, however reports 10/10 pain to right rib cage. Pain increases with deep inhalation.  RN reports there are no visible signs of injury or deformities.  Skin is intact. Patient was assisted back to bed.    Interventions/ Plan    Images have been ordered Nursing staff to continue monitoring for change in acute symptoms, mobility, and pain.        Anayansi Rundquist, DNP, ACNPC- AG [facility] [place]

## 2023-06-20 DIAGNOSIS — A419 Sepsis, unspecified organism: Secondary | ICD-10-CM | POA: Diagnosis not present

## 2023-06-20 DIAGNOSIS — R6520 Severe sepsis without septic shock: Secondary | ICD-10-CM | POA: Diagnosis not present

## 2023-06-20 DIAGNOSIS — N179 Acute kidney failure, unspecified: Secondary | ICD-10-CM | POA: Diagnosis not present

## 2023-06-20 LAB — GLUCOSE, CAPILLARY
Glucose-Capillary: 152 mg/dL — ABNORMAL HIGH (ref 70–99)
Glucose-Capillary: 105 mg/dL — ABNORMAL HIGH (ref 70–99)
Glucose-Capillary: 122 mg/dL — ABNORMAL HIGH (ref 70–99)
Glucose-Capillary: 144 mg/dL — ABNORMAL HIGH (ref 70–99)

## 2023-06-20 NOTE — Progress Notes (Signed)
 PROGRESS NOTE  Alan Tapia.  DOB: 09-05-1975  PCP: Patient, No Pcp Per FMW:[id number]  DOA: 04/06/2023  LOS: 74 days  Hospital Day: 76  Brief narrative: Alan Viloria. is a 48 y.o. male with PMH significant for DM2, HTN, HLD, combined CHF with EF 30%, DCM, peripheral neuropathy, left BKA status. 04/06/2023, patient presented to the ED with complaint of order from his right foot wound.  Admitted to [facility] for sepsis secondary to right foot osteomyelitis 11/27, underwent right BKA by Dr. Harden.  Subjective: Patient was seen and examined this morning. Sitting up in bed.  Not in distress.  Assessment and plan: Sepsis secondary to Present on admission, secondary to right foot osteomyelitis. Blood cultures with no growth.   Sepsis POA  right foot osteomyelitis s/p right BKA H/o left BKA Patient was initially treated empirically with antibiotics. Orthopedic surgery performed a right BKA on 11/27. Antibiotics discontinued. PT recommended SNF   Chronic combined systolic and diastolic heart failure Essential hypertension Last echo 1/3 with EF 30% Continue Toprol  XL, amlodipine  and Entresto  Net IO Since Admission: -83,351.49 mL [06/20/23 1505] Continue to monitor for daily intake output, weight, blood pressure, BNP, renal function and electrolytes. Recent Labs  Lab 06/19/23 0517  BUN 6  CREATININE 0.86  NA 135  K 4.2  MG 1.7    Type 2 diabetes mellitus A1c 6.8 in November 2024 PTA meds-not on insulin  Blood sugar level in labs this morning was elevated to over 225. Start SSI Accu-Cheks.  If fingersticks consistently under 200, can stop doing it. Recent Labs  Lab 06/19/23 2144 06/20/23 0816 06/20/23 1155  GLUCAP 226* 152* 105*   Recurrent diarrhea Likely due to aggressive bowel regimen.  Diarrhea improved after bowel regimen was stopped.   Abdominal edema Mostly to left side of abdomen with resultant fluid filled striae and mild dermatitis.  Patient declining  diuresis at this time. Improved with changing positions.   Hemorrhoids Treated with Anusol  cream.   Community acquired pneumonia Patient treated with Zosyn  and doxycycline . Resolved.   Acute on chronic anemia Patient with hemoglobin drop down to 5.9 g/dL secondary to right foot wound, requiring 3 units of PRBC on 11/26. Patient requiring another 1 unit of PRBC on 11/30. Hgb now stable around 9-10's.   COVID-19 infection Patient completed isolation protocol. Patient treated with Paxlovid .   Constipation Holding bowel regimen secondary to worsening diarrhea.   Pressure injury Bilateral perineum, unclear if present on admission.   Fall 2/6, fell out of bed.  No obvious injury.  Chest x-ray without any evidence of rib fracture.    Mobility: Limited due to bilateral BKA status  Goals of care   Code Status: Full Code     DVT prophylaxis: Lovenox  subcu    Antimicrobials: None currently Fluid: None Consultants: None Family Communication: None at bedside  Status: Inpatient Level of care:  Med-Surg   Patient is from: Home Needs to continue in-hospital care: Pending SNF Anticipated d/c to: Pending clinical course and SNF    Diet:  Diet Order             Diet regular Room service appropriate? Yes; Fluid consistency: Thin  Diet effective now           Diet - low sodium heart healthy                   Scheduled Meds:  amLODipine   5 mg Oral Daily   enoxaparin  (LOVENOX ) injection  60 mg Subcutaneous  Daily   feeding supplement  237 mL Oral BID BM   Gerhardt's butt cream   Topical BID    HYDROmorphone  (DILAUDID ) injection  0.5 mg Intravenous Once   insulin  aspart  0-5 Units Subcutaneous QHS   insulin  aspart  0-9 Units Subcutaneous TID WC   lidocaine   2 patch Transdermal Q24H   melatonin  5 mg Oral QHS   metoprolol  succinate  200 mg Oral Daily   pantoprazole   40 mg Oral BID   sacubitril -valsartan   1 tablet Oral BID    PRN meds: acetaminophen , cloNIDine ,  dextromethorphan -guaiFENesin , diclofenac  Sodium, hydrOXYzine , methocarbamol , ondansetron  **OR** ondansetron  (ZOFRAN ) IV, mouth rinse, oxyCODONE , oxyCODONE , phenol, polyethylene glycol, simethicone    Infusions:    Antimicrobials: Anti-infectives (From admission, onward)    Start     Dose/Rate Route Frequency Ordered Stop   04/30/23 1800  nirmatrelvir /ritonavir  (PAXLOVID ) 3 tablet        3 tablet Oral 2 times daily 04/30/23 1621 05/05/23 0820   04/09/23 1345  doxycycline  (VIBRAMYCIN ) 100 mg in dextrose  5 % 250 mL IVPB        100 mg 125 mL/hr over 120 Minutes Intravenous Every 12 hours 04/09/23 1252 04/09/23 1746   04/08/23 1300  levofloxacin  (LEVAQUIN ) IVPB 500 mg  Status:  Discontinued        500 mg 100 mL/hr over 60 Minutes Intravenous On call to O.R. 04/08/23 1229 04/08/23 1648   04/08/23 1300  vancomycin  (VANCOREADY) IVPB 1500 mg/300 mL        1,500 mg 100 mL/hr over 180 Minutes Intravenous On call to O.R. 04/08/23 1229 04/08/23 1433   04/08/23 1237  vancomycin  HCl (VANCOREADY) 1500 MG/300ML IVPB       Note to Pharmacy: Barron Friday D: cabinet override      04/08/23 1237 04/08/23 1439   04/07/23 0800  piperacillin -tazobactam (ZOSYN ) IVPB 3.375 g        3.375 g 12.5 mL/hr over 240 Minutes Intravenous Every 8 hours 04/07/23 0108 04/09/23 2108   04/07/23 0100  doxycycline  (VIBRAMYCIN ) 100 mg in dextrose  5 % 250 mL IVPB  Status:  Discontinued        100 mg 125 mL/hr over 120 Minutes Intravenous Every 12 hours 04/07/23 0057 04/09/23 1252   04/07/23 0000  ceFEPIme  (MAXIPIME ) 2 g in sodium chloride  0.9 % 100 mL IVPB  Status:  Discontinued        2 g 200 mL/hr over 30 Minutes Intravenous Every 8 hours 04/06/23 2349 04/07/23 0108   04/06/23 2330  metroNIDAZOLE  (FLAGYL ) IVPB 500 mg        500 mg 100 mL/hr over 60 Minutes Intravenous  Once 04/06/23 2321 04/07/23 0032   04/06/23 2300  piperacillin -tazobactam (ZOSYN ) IVPB 3.375 g  Status:  Discontinued        3.375 g 100 mL/hr over 30  Minutes Intravenous  Once 04/06/23 2257 04/06/23 2320   04/06/23 2300  vancomycin  (VANCOREADY) IVPB 2000 mg/400 mL  Status:  Discontinued        2,000 mg 200 mL/hr over 120 Minutes Intravenous  Once 04/06/23 2257 04/06/23 2312       Objective: Vitals:   06/20/23 0419 06/20/23 0747  BP: (!) 143/78 (!) 151/81  Pulse: 66 72  Resp: 18 16  Temp: 97.8 F (36.6 C) 98.3 F (36.8 C)  SpO2: 98% 100%   No intake or output data in the 24 hours ending 06/20/23 1505  Filed Weights   06/18/23 0451 06/19/23 0559 06/20/23 0500  Weight: 126.8  kg 126.1 kg 130.8 kg   Weight change: 4.7 kg Body mass index is 35.1 kg/m.   Physical Exam: General exam: Pleasant, middle-aged African-American male Skin: No rashes, lesions or ulcers. HEENT: Atraumatic, normocephalic, no obvious bleeding Lungs: Clear to auscultation bilaterally, no tenderness on palpation CVS: S1, S2, no murmur,   GI/Abd: Soft, nontender, nondistended, bowel sound present,   CNS: Alert, awake, oriented x 3 Psychiatry: Mood appropriate,  Extremities: Bilateral BKA status  Data Review: I have personally reviewed the laboratory data and studies available.  F/u labs  Unresulted Labs (From admission, onward)    None      Total time spent in review of labs and imaging, patient evaluation, formulation of plan, documentation and communication with family: 25 minutes  Signed, Chapman Rota, MD [facility] 06/20/2023

## 2023-06-20 NOTE — Plan of Care (Signed)
  Problem: Fluid Volume: Goal: Hemodynamic stability will improve Outcome: Progressing   Problem: Clinical Measurements: Goal: Diagnostic test results will improve Outcome: Progressing Goal: Signs and symptoms of infection will decrease Outcome: Progressing   Problem: Respiratory: Goal: Ability to maintain adequate ventilation will improve Outcome: Progressing   Problem: Health Behavior/Discharge Planning: Goal: Ability to manage health-related needs will improve Outcome: Progressing   Problem: Clinical Measurements: Goal: Ability to maintain clinical measurements within normal limits will improve Outcome: Progressing

## 2023-06-21 DIAGNOSIS — N179 Acute kidney failure, unspecified: Secondary | ICD-10-CM | POA: Diagnosis not present

## 2023-06-21 DIAGNOSIS — A419 Sepsis, unspecified organism: Secondary | ICD-10-CM | POA: Diagnosis not present

## 2023-06-21 DIAGNOSIS — R6520 Severe sepsis without septic shock: Secondary | ICD-10-CM | POA: Diagnosis not present

## 2023-06-21 LAB — GLUCOSE, CAPILLARY
Glucose-Capillary: 180 mg/dL — ABNORMAL HIGH (ref 70–99)
Glucose-Capillary: 109 mg/dL — ABNORMAL HIGH (ref 70–99)
Glucose-Capillary: 142 mg/dL — ABNORMAL HIGH (ref 70–99)
Glucose-Capillary: 199 mg/dL — ABNORMAL HIGH (ref 70–99)

## 2023-06-21 NOTE — Plan of Care (Signed)
 Problem: Fluid Volume: Goal: Hemodynamic stability will improve Outcome: Progressing   Problem: Clinical Measurements: Goal: Diagnostic test results will improve Outcome: Progressing Goal: Signs and symptoms of infection will decrease Outcome: Progressing   Problem: Respiratory: Goal: Ability to maintain adequate ventilation will improve Outcome: Progressing   Problem: Education: Goal: Knowledge of General Education information will improve Description: Including pain rating scale, medication(s)/side effects and non-pharmacologic comfort measures Outcome: Progressing   Problem: Health Behavior/Discharge Planning: Goal: Ability to manage health-related needs will improve Outcome: Progressing   Problem: Clinical Measurements: Goal: Ability to maintain clinical measurements within normal limits will improve Outcome: Progressing Goal: Will remain free from infection Outcome: Progressing Goal: Diagnostic test results will improve Outcome: Progressing Goal: Respiratory complications will improve Outcome: Progressing Goal: Cardiovascular complication will be avoided Outcome: Progressing   Problem: Activity: Goal: Risk for activity intolerance will decrease Outcome: Progressing   Problem: Nutrition: Goal: Adequate nutrition will be maintained Outcome: Progressing   Problem: Coping: Goal: Level of anxiety will decrease Outcome: Progressing   Problem: Elimination: Goal: Will not experience complications related to bowel motility Outcome: Progressing Goal: Will not experience complications related to urinary retention Outcome: Progressing   Problem: Pain Management: Goal: General experience of comfort will improve Outcome: Progressing   Problem: Safety: Goal: Ability to remain free from injury will improve Outcome: Progressing   Problem: Skin Integrity: Goal: Risk for impaired skin integrity will decrease Outcome: Progressing   Problem: Activity: Goal: Ability  to tolerate increased activity will improve Outcome: Progressing   Problem: Clinical Measurements: Goal: Ability to maintain a body temperature in the normal range will improve Outcome: Progressing   Problem: Respiratory: Goal: Ability to maintain adequate ventilation will improve Outcome: Progressing Goal: Ability to maintain a clear airway will improve Outcome: Progressing   Problem: Education: Goal: Knowledge of the prescribed therapeutic regimen will improve Outcome: Progressing Goal: Ability to verbalize activity precautions or restrictions will improve Outcome: Progressing Goal: Understanding of discharge needs will improve Outcome: Progressing   Problem: Activity: Goal: Ability to perform//tolerate increased activity and mobilize with assistive devices will improve Outcome: Progressing   Problem: Clinical Measurements: Goal: Postoperative complications will be avoided or minimized Outcome: Progressing   Problem: Self-Care: Goal: Ability to meet self-care needs will improve Outcome: Progressing   Problem: Self-Concept: Goal: Ability to maintain and perform role responsibilities to the fullest extent possible will improve Outcome: Progressing   Problem: Education: Goal: Knowledge of General Education information will improve Description: Including pain rating scale, medication(s)/side effects and non-pharmacologic comfort measures Outcome: Progressing   Problem: Health Behavior/Discharge Planning: Goal: Ability to manage health-related needs will improve Outcome: Progressing   Problem: Clinical Measurements: Goal: Ability to maintain clinical measurements within normal limits will improve Outcome: Progressing Goal: Will remain free from infection Outcome: Progressing Goal: Diagnostic test results will improve Outcome: Progressing Goal: Respiratory complications will improve Outcome: Progressing Goal: Cardiovascular complication will be avoided Outcome:  Progressing   Problem: Activity: Goal: Risk for activity intolerance will decrease Outcome: Progressing   Problem: Nutrition: Goal: Adequate nutrition will be maintained Outcome: Progressing   Problem: Coping: Goal: Level of anxiety will decrease Outcome: Progressing   Problem: Elimination: Goal: Will not experience complications related to bowel motility Outcome: Progressing Goal: Will not experience complications related to urinary retention Outcome: Progressing   Problem: Pain Management: Goal: General experience of comfort will improve Outcome: Progressing   Problem: Safety: Goal: Ability to remain free from injury will improve Outcome: Progressing   Problem: Skin Integrity: Goal: Risk for  impaired skin integrity will decrease Outcome: Progressing

## 2023-06-21 NOTE — Progress Notes (Signed)
 PROGRESS NOTE  Alan Tapia.  DOB: December 16, 1975  PCP: Patient, No Pcp Per FMW:[id number]  DOA: 04/06/2023  LOS: 75 days  Hospital Day: 77  Brief narrative: Loc Feinstein. is a 48 y.o. male with PMH significant for DM2, HTN, HLD, combined CHF with EF 30%, DCM, peripheral neuropathy, left BKA status. 04/06/2023, patient presented to the ED with complaint of order from his right foot wound.  Admitted to [facility] for sepsis secondary to right foot osteomyelitis 11/27, underwent right BKA by Dr. Harden.  Subjective: Patient was seen and examined this morning. Sitting up in bed.  Not in distress. No new symptoms.  Assessment and plan:  Sepsis POA  right foot osteomyelitis s/p right BKA H/o left BKA Patient was initially treated empirically with antibiotics. Orthopedic surgery performed a right BKA on 11/27. Antibiotics discontinued. PT recommended SNF   Chronic combined systolic and diastolic heart failure Essential hypertension Last echo 1/3 with EF 30% Continue Toprol  XL, amlodipine  and Entresto  Net IO Since Admission: -17,608.49 mL [06/21/23 1010] Continue to monitor for daily intake output, weight, blood pressure, BNP, renal function and electrolytes. Recent Labs  Lab 06/19/23 0517  BUN 6  CREATININE 0.86  NA 135  K 4.2  MG 1.7    Type 2 diabetes mellitus A1c 6.8 in November 2024 PTA meds-not on insulin  Blood sugar level improved.  Patient states it was elevated last week because he was eating a lot for his birthday. Currently on SSI Accu-Cheks.  If fingersticks consistently under 200, can stop doing it tomorrow. Recent Labs  Lab 06/20/23 0816 06/20/23 1155 06/20/23 1628 06/20/23 2014 06/21/23 0739  GLUCAP 152* 105* 122* 144* 180*   Recurrent diarrhea Likely due to aggressive bowel regimen.  Diarrhea improved after bowel regimen was stopped.   Abdominal wall edema Mostly to left side of abdomen with resultant fluid filled striae and mild dermatitis.   Patient declining diuresis at this time. Improved with changing positions.   Hemorrhoids Treated with Anusol  cream.   Community acquired pneumonia Patient treated with Zosyn  and doxycycline . Resolved.   Acute on chronic anemia Patient with hemoglobin drop down to 5.9 g/dL secondary to right foot wound, requiring 3 units of PRBC on 11/26. Patient requiring another 1 unit of PRBC on 11/30. Hgb now stable around 9-10's.   COVID-19 infection Patient completed isolation protocol. Patient treated with Paxlovid .   Constipation Holding bowel regimen secondary to worsening diarrhea.   Pressure injury Bilateral perineum, unclear if present on admission.   Fall 2/6, fell out of bed.  No obvious injury.  Chest x-ray without any evidence of rib fracture.    Mobility: Limited due to bilateral BKA status  Goals of care   Code Status: Full Code     DVT prophylaxis: Lovenox  subcu    Antimicrobials: None currently Fluid: None Consultants: None Family Communication: None at bedside  Status: Inpatient Level of care:  Med-Surg   Patient is from: Home Needs to continue in-hospital care: Pending SNF Anticipated d/c to: Pending clinical course and SNF    Diet:  Diet Order             Diet regular Room service appropriate? Yes; Fluid consistency: Thin  Diet effective now           Diet - low sodium heart healthy                   Scheduled Meds:  amLODipine   5 mg Oral Daily   enoxaparin  (LOVENOX ) injection  60 mg Subcutaneous Daily   feeding supplement  237 mL Oral BID BM   Gerhardt's butt cream   Topical BID    HYDROmorphone  (DILAUDID ) injection  0.5 mg Intravenous Once   insulin  aspart  0-5 Units Subcutaneous QHS   insulin  aspart  0-9 Units Subcutaneous TID WC   lidocaine   2 patch Transdermal Q24H   melatonin  5 mg Oral QHS   metoprolol  succinate  200 mg Oral Daily   pantoprazole   40 mg Oral BID   sacubitril -valsartan   1 tablet Oral BID    PRN  meds: acetaminophen , cloNIDine , dextromethorphan -guaiFENesin , diclofenac  Sodium, hydrOXYzine , methocarbamol , ondansetron  **OR** ondansetron  (ZOFRAN ) IV, mouth rinse, oxyCODONE , oxyCODONE , phenol, polyethylene glycol, simethicone    Infusions:    Antimicrobials: Anti-infectives (From admission, onward)    Start     Dose/Rate Route Frequency Ordered Stop   04/30/23 1800  nirmatrelvir /ritonavir  (PAXLOVID ) 3 tablet        3 tablet Oral 2 times daily 04/30/23 1621 05/05/23 0820   04/09/23 1345  doxycycline  (VIBRAMYCIN ) 100 mg in dextrose  5 % 250 mL IVPB        100 mg 125 mL/hr over 120 Minutes Intravenous Every 12 hours 04/09/23 1252 04/09/23 1746   04/08/23 1300  levofloxacin  (LEVAQUIN ) IVPB 500 mg  Status:  Discontinued        500 mg 100 mL/hr over 60 Minutes Intravenous On call to O.R. 04/08/23 1229 04/08/23 1648   04/08/23 1300  vancomycin  (VANCOREADY) IVPB 1500 mg/300 mL        1,500 mg 100 mL/hr over 180 Minutes Intravenous On call to O.R. 04/08/23 1229 04/08/23 1433   04/08/23 1237  vancomycin  HCl (VANCOREADY) 1500 MG/300ML IVPB       Note to Pharmacy: Barron Friday D: cabinet override      04/08/23 1237 04/08/23 1439   04/07/23 0800  piperacillin -tazobactam (ZOSYN ) IVPB 3.375 g        3.375 g 12.5 mL/hr over 240 Minutes Intravenous Every 8 hours 04/07/23 0108 04/09/23 2108   04/07/23 0100  doxycycline  (VIBRAMYCIN ) 100 mg in dextrose  5 % 250 mL IVPB  Status:  Discontinued        100 mg 125 mL/hr over 120 Minutes Intravenous Every 12 hours 04/07/23 0057 04/09/23 1252   04/07/23 0000  ceFEPIme  (MAXIPIME ) 2 g in sodium chloride  0.9 % 100 mL IVPB  Status:  Discontinued        2 g 200 mL/hr over 30 Minutes Intravenous Every 8 hours 04/06/23 2349 04/07/23 0108   04/06/23 2330  metroNIDAZOLE  (FLAGYL ) IVPB 500 mg        500 mg 100 mL/hr over 60 Minutes Intravenous  Once 04/06/23 2321 04/07/23 0032   04/06/23 2300  piperacillin -tazobactam (ZOSYN ) IVPB 3.375 g  Status:  Discontinued         3.375 g 100 mL/hr over 30 Minutes Intravenous  Once 04/06/23 2257 04/06/23 2320   04/06/23 2300  vancomycin  (VANCOREADY) IVPB 2000 mg/400 mL  Status:  Discontinued        2,000 mg 200 mL/hr over 120 Minutes Intravenous  Once 04/06/23 2257 04/06/23 2312       Objective: Vitals:   06/21/23 0436 06/21/23 0741  BP: (!) 150/78 (!) 150/70  Pulse: 65 64  Resp: 18 18  Temp: 98 F (36.7 C) (!) 97.5 F (36.4 C)  SpO2: 100% 98%    Intake/Output Summary (Last 24 hours) at 06/21/2023 1010 Last data filed at 06/21/2023 0900 Gross per 24 hour  Intake 240  ml  Output 1200 ml  Net -960 ml    Filed Weights   06/19/23 0559 06/20/23 0500 06/21/23 0438  Weight: 126.1 kg 130.8 kg 131.9 kg   Weight change: 1.1 kg Body mass index is 35.4 kg/m.   Physical Exam: General exam: Pleasant, middle-aged African-American male Skin: No rashes, lesions or ulcers. HEENT: Atraumatic, normocephalic, no obvious bleeding Lungs: Clear to auscultation bilaterally, no tenderness on palpation CVS: S1, S2, no murmur,   GI/Abd: Soft, nontender, nondistended, bowel sound present,   CNS: Alert, awake, oriented x 3 Psychiatry: Mood appropriate,  Extremities: Bilateral BKA status  Data Review: I have personally reviewed the laboratory data and studies available.  F/u labs  Unresulted Labs (From admission, onward)    None      Total time spent in review of labs and imaging, patient evaluation, formulation of plan, documentation and communication with family: 25 minutes  Signed, Chapman Rota, MD [facility] 06/21/2023

## 2023-06-22 ENCOUNTER — Encounter: Payer: Medicaid Other | Attending: Physical Medicine & Rehabilitation | Admitting: Physical Medicine & Rehabilitation

## 2023-06-22 DIAGNOSIS — A419 Sepsis, unspecified organism: Secondary | ICD-10-CM | POA: Diagnosis not present

## 2023-06-22 DIAGNOSIS — R6520 Severe sepsis without septic shock: Secondary | ICD-10-CM | POA: Diagnosis not present

## 2023-06-22 DIAGNOSIS — N179 Acute kidney failure, unspecified: Secondary | ICD-10-CM | POA: Diagnosis not present

## 2023-06-22 LAB — GLUCOSE, CAPILLARY
Glucose-Capillary: 145 mg/dL — ABNORMAL HIGH (ref 70–99)
Glucose-Capillary: 114 mg/dL — ABNORMAL HIGH (ref 70–99)
Glucose-Capillary: 190 mg/dL — ABNORMAL HIGH (ref 70–99)

## 2023-06-22 NOTE — Plan of Care (Signed)
 Problem: Fluid Volume: Goal: Hemodynamic stability will improve Outcome: Progressing   Problem: Clinical Measurements: Goal: Diagnostic test results will improve Outcome: Progressing Goal: Signs and symptoms of infection will decrease Outcome: Progressing   Problem: Respiratory: Goal: Ability to maintain adequate ventilation will improve Outcome: Progressing   Problem: Education: Goal: Knowledge of General Education information will improve Description: Including pain rating scale, medication(s)/side effects and non-pharmacologic comfort measures Outcome: Progressing   Problem: Health Behavior/Discharge Planning: Goal: Ability to manage health-related needs will improve Outcome: Progressing   Problem: Clinical Measurements: Goal: Ability to maintain clinical measurements within normal limits will improve Outcome: Progressing Goal: Will remain free from infection Outcome: Progressing Goal: Diagnostic test results will improve Outcome: Progressing Goal: Respiratory complications will improve Outcome: Progressing Goal: Cardiovascular complication will be avoided Outcome: Progressing   Problem: Activity: Goal: Risk for activity intolerance will decrease Outcome: Progressing   Problem: Nutrition: Goal: Adequate nutrition will be maintained Outcome: Progressing   Problem: Coping: Goal: Level of anxiety will decrease Outcome: Progressing   Problem: Elimination: Goal: Will not experience complications related to bowel motility Outcome: Progressing Goal: Will not experience complications related to urinary retention Outcome: Progressing   Problem: Pain Management: Goal: General experience of comfort will improve Outcome: Progressing   Problem: Safety: Goal: Ability to remain free from injury will improve Outcome: Progressing   Problem: Skin Integrity: Goal: Risk for impaired skin integrity will decrease Outcome: Progressing   Problem: Activity: Goal: Ability  to tolerate increased activity will improve Outcome: Progressing   Problem: Clinical Measurements: Goal: Ability to maintain a body temperature in the normal range will improve Outcome: Progressing   Problem: Respiratory: Goal: Ability to maintain adequate ventilation will improve Outcome: Progressing Goal: Ability to maintain a clear airway will improve Outcome: Progressing   Problem: Education: Goal: Knowledge of the prescribed therapeutic regimen will improve Outcome: Progressing Goal: Ability to verbalize activity precautions or restrictions will improve Outcome: Progressing Goal: Understanding of discharge needs will improve Outcome: Progressing   Problem: Activity: Goal: Ability to perform//tolerate increased activity and mobilize with assistive devices will improve Outcome: Progressing   Problem: Clinical Measurements: Goal: Postoperative complications will be avoided or minimized Outcome: Progressing   Problem: Self-Care: Goal: Ability to meet self-care needs will improve Outcome: Progressing   Problem: Self-Concept: Goal: Ability to maintain and perform role responsibilities to the fullest extent possible will improve Outcome: Progressing   Problem: Pain Management: Goal: Pain level will decrease with appropriate interventions Outcome: Progressing   Problem: Skin Integrity: Goal: Demonstration of wound healing without infection will improve Outcome: Progressing   Problem: Education: Goal: Knowledge of General Education information will improve Description: Including pain rating scale, medication(s)/side effects and non-pharmacologic comfort measures Outcome: Progressing   Problem: Health Behavior/Discharge Planning: Goal: Ability to manage health-related needs will improve Outcome: Progressing   Problem: Clinical Measurements: Goal: Ability to maintain clinical measurements within normal limits will improve Outcome: Progressing Goal: Will remain free  from infection Outcome: Progressing Goal: Diagnostic test results will improve Outcome: Progressing Goal: Respiratory complications will improve Outcome: Progressing Goal: Cardiovascular complication will be avoided Outcome: Progressing   Problem: Activity: Goal: Risk for activity intolerance will decrease Outcome: Progressing   Problem: Nutrition: Goal: Adequate nutrition will be maintained Outcome: Progressing   Problem: Coping: Goal: Level of anxiety will decrease Outcome: Progressing   Problem: Elimination: Goal: Will not experience complications related to bowel motility Outcome: Progressing Goal: Will not experience complications related to urinary retention Outcome: Progressing   Problem: Pain Management: Goal:  General experience of comfort will improve Outcome: Progressing   Problem: Safety: Goal: Ability to remain free from injury will improve Outcome: Progressing   Problem: Skin Integrity: Goal: Risk for impaired skin integrity will decrease Outcome: Progressing   Problem: Education: Goal: Ability to describe self-care measures that may prevent or decrease complications (Diabetes Survival Skills Education) will improve Outcome: Progressing Goal: Individualized Educational Video(s) Outcome: Progressing   Problem: Coping: Goal: Ability to adjust to condition or change in health will improve Outcome: Progressing   Problem: Fluid Volume: Goal: Ability to maintain a balanced intake and output will improve Outcome: Progressing   Problem: Health Behavior/Discharge Planning: Goal: Ability to identify and utilize available resources and services will improve Outcome: Progressing Goal: Ability to manage health-related needs will improve Outcome: Progressing   Problem: Metabolic: Goal: Ability to maintain appropriate glucose levels will improve Outcome: Progressing   Problem: Nutritional: Goal: Maintenance of adequate nutrition will improve Outcome:  Progressing Goal: Progress toward achieving an optimal weight will improve Outcome: Progressing   Problem: Skin Integrity: Goal: Risk for impaired skin integrity will decrease Outcome: Progressing   Problem: Tissue Perfusion: Goal: Adequacy of tissue perfusion will improve Outcome: Progressing   Problem: Education: Goal: Knowledge of risk factors and measures for prevention of condition will improve Outcome: Progressing   Problem: Coping: Goal: Psychosocial and spiritual needs will be supported Outcome: Progressing   Problem: Respiratory: Goal: Will maintain a patent airway Outcome: Progressing Goal: Complications related to the disease process, condition or treatment will be avoided or minimized Outcome: Progressing

## 2023-06-22 NOTE — Progress Notes (Signed)
 Physical Therapy Treatment Patient Details Name: Alan Tapia. MRN: [id number] DOB: 17-Dec-1975 Today's Date: 06/22/2023   History of Present Illness The pt is a 48 yo male presenting 11/25 with nausea, vomiting, and fever as well as R foot wound. 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    PT Comments  Patient agreeable to therapy, despite reporting right abdominal pain. The focus of today's session was improving w/c mobility and transfers. Under supervision, the patient demonstrated a rolling technique to execute a supine to sit transfer. Required Mod A +2 for lateral/scoot transfer to w/c in order to execute full transition to w/c. Once in the w/c, the patient was able to scoot hips back into the w/c, utilizing bilateral UE support. Following bed > w/c transfer, the patient was able to propel himself in his w/c for 8ft in the unit before asking to be pushed back into his room to have a bowel movement. Patient demonstrated an A>P and P>A transfer from bed to chair and chair to bed, requiring Mod A +2 for physical assistance. Acute PT will continue to follow up to improve w/c mobility and tranfers.    If plan is discharge home, recommend the following: Two people to help with walking and/or transfers;Assist for transportation;Assistance with cooking/housework;Help with stairs or ramp for entrance   Can travel by private vehicle     No  Equipment Recommendations  Hoyer lift;Wheelchair cushion (measurements PT);Wheelchair (measurements PT);Hospital bed    Recommendations for Other Services       Precautions / Restrictions Precautions Precautions: Fall Restrictions Weight Bearing Restrictions Per Provider Order: Yes RLE Weight Bearing Per Provider Order: Non weight bearing Other Position/Activity Restrictions: bilateral  BKA (L chronic, R new)     Mobility  Bed Mobility Overal bed mobility: Needs Assistance Bed Mobility: Rolling, Supine to Sit Rolling: Used rails, Supervision   Supine to sit: Supervision     General bed mobility comments: Pt able to transition semi fowler to long sitting with supervision with use of bed rails. Trunk control limited for bed mobility due to abdominal pain    Transfers Overall transfer level: Needs assistance Equipment used: Sliding board Transfers: Bed to chair/wheelchair/BSC         Anterior-Posterior transfers: +2 physical assistance, Mod assist  Lateral/Scoot Transfers: Mod assist, +2 safety/equipment General transfer comment: Pt performed slide board transfer to w/c, requiring Mod A +2 to execute full transition to w/c. Patient able to assist with transitioning hips back into w/c. Anterior to Posterior/Posterior to Anterior transfer to and from [facility]    Ambulation/Gait                   Psychologist, counselling mobility: Yes Wheelchair propulsion: Both upper extremities Wheelchair parts: Supervision/cueing Distance: 80 Wheelchair Assistance Details (indicate cue type and reason): Several rest breaks, practiced on level surface   Tilt Bed    Modified Rankin (Stroke Patients Only)       Balance Overall balance assessment: Needs assistance Sitting-balance support: Feet unsupported Sitting balance-Leahy Scale: Fair  Cognition Arousal: Alert Behavior During Therapy: WFL for tasks assessed/performed Overall Cognitive Status: Within Functional Limits for tasks assessed                                          Exercises      General Comments        Pertinent Vitals/Pain Pain Assessment Pain Assessment: Faces Faces Pain Scale: Hurts even more Pain Location: Abdomen Pain Descriptors / Indicators: Discomfort,  Grimacing Pain Intervention(s): Limited activity within patient's tolerance, Monitored during session    Home Living                          Prior Function            PT Goals (current goals can now be found in the care plan section) Acute Rehab PT Goals PT Goal Formulation: With patient Time For Goal Achievement: 06/25/23 Potential to Achieve Goals: Fair Progress towards PT goals: Progressing toward goals    Frequency    Min 1X/week      PT Plan      Co-evaluation              AM-PAC PT "6 Clicks" Mobility   Outcome Measure  Help needed turning from your back to your side while in a flat bed without using bedrails?: A Little Help needed moving from lying on your back to sitting on the side of a flat bed without using bedrails?: A Little Help needed moving to and from a bed to a chair (including a wheelchair)?: A Lot Help needed standing up from a chair using your arms (e.g., wheelchair or bedside chair)?: Total Help needed to walk in hospital room?: Total Help needed climbing 3-5 steps with a railing? : Total 6 Click Score: 11    End of Session Equipment Utilized During Treatment: Gait belt Activity Tolerance: Patient limited by pain Patient left: in bed;with call bell/phone within reach;with bed alarm set   PT Visit Diagnosis: Other abnormalities of gait and mobility (R26.89);Muscle weakness (generalized) (M62.81);Pain Pain - Right/Left: Right Pain - part of body:  (Abdomen)     Time: 1410-1500 PT Time Calculation (min) (ACUTE ONLY): 50 min  Charges:    $Therapeutic Activity: 38-52 mins PT General Charges $$ ACUTE PT VISIT: 1 Visit                     Alan Tapia, SPT    Alan Tapia 06/22/2023, 4:44 PM

## 2023-06-22 NOTE — Progress Notes (Signed)
 PROGRESS NOTE  Alan Tapia.  DOB: 1975-06-24  PCP: Patient, No Pcp Per BJY:[id number]  DOA: 04/06/2023  LOS: 76 days  Hospital Day: 78  Brief narrative: Alan Kerin. is a 48 y.o. male with PMH significant for DM2, HTN, HLD, combined CHF with EF 30%, DCM, peripheral neuropathy, left BKA status. 04/06/2023, patient presented to the ED with complaint of order from his right foot wound.  Admitted to [facility] for sepsis secondary to right foot osteomyelitis 11/27, underwent right BKA by Dr. Julio Ohm.  Subjective: Patient was seen and examined this morning. Sitting up in bed.  Not in distress. No new symptoms. No essential change in last several days.  Assessment and plan:  Sepsis POA  right foot osteomyelitis s/p right BKA H/o left BKA Patient was initially treated empirically with antibiotics. Orthopedic surgery performed a right BKA on 11/27. Antibiotics discontinued. PT recommended SNF   Chronic combined systolic and diastolic heart failure Essential hypertension Last echo 1/3 with EF 30% Continue Toprol  XL, amlodipine  and Entresto  Net IO Since Admission: -08,657.49 mL [06/22/23 1148] Continue to monitor for daily intake output, weight, blood pressure, BNP, renal function and electrolytes. Recent Labs  Lab 06/19/23 0517  BUN 6  CREATININE 0.86  NA 135  K 4.2  MG 1.7    Type 2 diabetes mellitus A1c 6.8 in November 2024 PTA meds-not on insulin  Blood sugar level was elevated last week.  Patient states it was elevated last week because he was eating a lot for his birthday. Currently on SSI Accu-Cheks. fingersticks consistently under 200. Can stop fingersticks.  Encourage dietary discretion Recent Labs  Lab 06/21/23 1134 06/21/23 1545 06/21/23 2052 06/22/23 0823 06/22/23 1141  GLUCAP 109* 142* 199* 145* 114*   Recurrent diarrhea Likely due to aggressive bowel regimen.  Diarrhea improved after bowel regimen was stopped.   Abdominal wall edema Mostly to left  side of abdomen with resultant fluid filled striae and mild dermatitis.  Patient declining diuresis at this time. Improved with changing positions.   Hemorrhoids Treated with Anusol  cream.   Community acquired pneumonia Patient treated with Zosyn  and doxycycline . Resolved.   Acute on chronic anemia Patient with hemoglobin drop down to 5.9 g/dL secondary to right foot wound, requiring 3 units of PRBC on 11/26. Patient requiring another 1 unit of PRBC on 11/30. Hgb now stable around 9-10's.   COVID-19 infection Patient completed isolation protocol. Patient treated with Paxlovid .   Constipation Holding bowel regimen secondary to worsening diarrhea.   Pressure injury Bilateral perineum, unclear if present on admission.   Fall 2/6, fell out of bed.  No obvious injury.  Chest x-ray without any evidence of rib fracture.    Mobility: Limited due to bilateral BKA status  Goals of care   Code Status: Full Code     DVT prophylaxis: Lovenox  subcu    Antimicrobials: None currently Fluid: None Consultants: None Family Communication: None at bedside  Status: Inpatient Level of care:  Med-Surg   Patient is from: Home Needs to continue in-hospital care: Pending SNF Anticipated d/c to: Pending clinical course and SNF    Diet:  Diet Order             Diet regular Room service appropriate? Yes; Fluid consistency: Thin  Diet effective now           Diet - low sodium heart healthy                   Scheduled Meds:  amLODipine   5 mg Oral Daily   enoxaparin  (LOVENOX ) injection  60 mg Subcutaneous Daily   feeding supplement  237 mL Oral BID BM   Gerhardt's butt cream   Topical BID    HYDROmorphone  (DILAUDID ) injection  0.5 mg Intravenous Once   lidocaine   2 patch Transdermal Q24H   melatonin  5 mg Oral QHS   metoprolol  succinate  200 mg Oral Daily   pantoprazole   40 mg Oral BID   sacubitril -valsartan   1 tablet Oral BID    PRN meds: acetaminophen , cloNIDine ,  dextromethorphan -guaiFENesin , diclofenac  Sodium, hydrOXYzine , methocarbamol , ondansetron  **OR** ondansetron  (ZOFRAN ) IV, mouth rinse, oxyCODONE , oxyCODONE , phenol, polyethylene glycol, simethicone    Infusions:    Antimicrobials: Anti-infectives (From admission, onward)    Start     Dose/Rate Route Frequency Ordered Stop   04/30/23 1800  nirmatrelvir /ritonavir  (PAXLOVID ) 3 tablet        3 tablet Oral 2 times daily 04/30/23 1621 05/05/23 0820   04/09/23 1345  doxycycline  (VIBRAMYCIN ) 100 mg in dextrose  5 % 250 mL IVPB        100 mg 125 mL/hr over 120 Minutes Intravenous Every 12 hours 04/09/23 1252 04/09/23 1746   04/08/23 1300  levofloxacin  (LEVAQUIN ) IVPB 500 mg  Status:  Discontinued        500 mg 100 mL/hr over 60 Minutes Intravenous On call to O.R. 04/08/23 1229 04/08/23 1648   04/08/23 1300  vancomycin  (VANCOREADY) IVPB 1500 mg/300 mL        1,500 mg 100 mL/hr over 180 Minutes Intravenous On call to O.R. 04/08/23 1229 04/08/23 1433   04/08/23 1237  vancomycin  HCl (VANCOREADY) 1500 MG/300ML IVPB       Note to Pharmacy: Harman Lightning D: cabinet override      04/08/23 1237 04/08/23 1439   04/07/23 0800  piperacillin -tazobactam (ZOSYN ) IVPB 3.375 g        3.375 g 12.5 mL/hr over 240 Minutes Intravenous Every 8 hours 04/07/23 0108 04/09/23 2108   04/07/23 0100  doxycycline  (VIBRAMYCIN ) 100 mg in dextrose  5 % 250 mL IVPB  Status:  Discontinued        100 mg 125 mL/hr over 120 Minutes Intravenous Every 12 hours 04/07/23 0057 04/09/23 1252   04/07/23 0000  ceFEPIme  (MAXIPIME ) 2 g in sodium chloride  0.9 % 100 mL IVPB  Status:  Discontinued        2 g 200 mL/hr over 30 Minutes Intravenous Every 8 hours 04/06/23 2349 04/07/23 0108   04/06/23 2330  metroNIDAZOLE  (FLAGYL ) IVPB 500 mg        500 mg 100 mL/hr over 60 Minutes Intravenous  Once 04/06/23 2321 04/07/23 0032   04/06/23 2300  piperacillin -tazobactam (ZOSYN ) IVPB 3.375 g  Status:  Discontinued        3.375 g 100 mL/hr over 30  Minutes Intravenous  Once 04/06/23 2257 04/06/23 2320   04/06/23 2300  vancomycin  (VANCOREADY) IVPB 2000 mg/400 mL  Status:  Discontinued        2,000 mg 200 mL/hr over 120 Minutes Intravenous  Once 04/06/23 2257 04/06/23 2312       Objective: Vitals:   06/22/23 0442 06/22/23 0727  BP: 138/71 (!) 150/74  Pulse: 71 72  Resp: 18 16  Temp: 98.2 F (36.8 C) 98.4 F (36.9 C)  SpO2: 100% 100%    Intake/Output Summary (Last 24 hours) at 06/22/2023 1148 Last data filed at 06/21/2023 1700 Gross per 24 hour  Intake 480 ml  Output 800 ml  Net -320 ml  Filed Weights   06/19/23 0559 06/20/23 0500 06/21/23 0438  Weight: 126.1 kg 130.8 kg 131.9 kg   Weight change:  Body mass index is 35.4 kg/m.   Physical Exam: General exam: Pleasant, middle-aged African-American male Skin: No rashes, lesions or ulcers. HEENT: Atraumatic, normocephalic, no obvious bleeding Lungs: Clear to auscultation bilaterally, no tenderness on palpation CVS: S1, S2, no murmur,   GI/Abd: Soft, nontender, nondistended, bowel sound present,   CNS: Alert, awake, oriented x 3 Psychiatry: Mood appropriate,  Extremities: Bilateral BKA status  Data Review: I have personally reviewed the laboratory data and studies available.  F/u labs  Unresulted Labs (From admission, onward)    None      Total time spent in review of labs and imaging, patient evaluation, formulation of plan, documentation and communication with family: 25 minutes  Signed, Hoyt Macleod, MD [facility] 06/22/2023

## 2023-06-22 NOTE — TOC Progression Note (Signed)
 Transition of Care (TOC) - Progression Note    Patient Details  Name: Alan Tapia. MRN: [id number] Date of Birth: 09/29/1975  Transition of Care [facility]) CM/SW Contact  Sherial Dimes, LCSW Phone Number: 06/22/2023, 12:45 PM  Clinical Narrative:     CSW advised that pt's BCBS is not active. CSW spoke with facility who will need specifics on pt's disability allotment. CSW advised that Disability is said to be active, but it has not been able to be confirmed by several sources. CSW left a VM for DSS worker again, to follow up on proof of pt's disability. TOC continues to follow.   Expected Discharge Plan: Skilled Nursing Facility Barriers to Discharge: Insurance Authorization, Continued Medical Work up, SNF Pending bed offer  Expected Discharge Plan and Services In-house Referral: Clinical Social Work     Living arrangements for the past 2 months: Single Family Home Expected Discharge Date: 04/11/23                                     Social Determinants of Health (SDOH) Interventions SDOH Screenings   Food Insecurity: No Food Insecurity (04/07/2023)  Housing: Unknown (04/23/2023)  Recent Concern: Housing - Medium Risk (04/07/2023)  Transportation Needs: No Transportation Needs (04/07/2023)  Recent Concern: Transportation Needs - Unmet Transportation Needs (02/17/2023)   Received from [facility]  Utilities: At Risk (04/07/2023)  Alcohol Screen: Low Risk  (08/20/2022)  Financial Resource Strain: Patient Unable To Answer (08/20/2022)  Tobacco Use: Low Risk  (04/08/2023)    Readmission Risk Interventions    04/07/2023    2:34 PM  Readmission Risk Prevention Plan  Transportation Screening Complete  PCP or Specialist Appt within 3-5 Days Complete  HRI or Home Care Consult Complete  Social Work Consult for Recovery Care Planning/Counseling Complete  Palliative Care Screening Not Applicable  Medication Review [employer]) Complete

## 2023-06-23 DIAGNOSIS — R6520 Severe sepsis without septic shock: Secondary | ICD-10-CM | POA: Diagnosis not present

## 2023-06-23 DIAGNOSIS — N179 Acute kidney failure, unspecified: Secondary | ICD-10-CM | POA: Diagnosis not present

## 2023-06-23 DIAGNOSIS — A419 Sepsis, unspecified organism: Secondary | ICD-10-CM | POA: Diagnosis not present

## 2023-06-23 NOTE — Plan of Care (Signed)
Problem: Clinical Measurements: Goal: Diagnostic test results will improve Outcome: Progressing   Problem: Activity: Goal: Risk for activity intolerance will decrease Outcome: Progressing

## 2023-06-23 NOTE — Progress Notes (Signed)
PROGRESS NOTE  Alan Tapia.  DOB: 02/10/1976  PCP: Alan Tapia, No Pcp Per ZOX:[id number]  DOA: 04/06/2023  LOS: 77 days  Hospital Day: 79  Brief narrative: Alan Tapia. is a 48 y.o. male with PMH significant for DM2, HTN, HLD, combined CHF with EF 30%, DCM, peripheral neuropathy, left BKA status. 04/06/2023, Alan Tapia presented to the ED with complaint of order from his right foot wound.  Admitted to [facility] for sepsis secondary to right foot osteomyelitis 11/27, underwent right BKA by Dr. Lajoyce Corners.  Subjective: Alan Tapia was seen and examined this morning. Sitting up in bed.  Not in distress. No new symptoms. Fingerstick blood sugar readings stable.  Assessment and plan:  Sepsis POA  right foot osteomyelitis s/p right BKA H/o left BKA Alan Tapia was initially treated empirically with antibiotics. Orthopedic surgery performed a right BKA on 11/27. Antibiotics discontinued. PT recommended SNF   Chronic combined systolic and diastolic heart failure Essential hypertension Last echo 1/3 with EF 30% Continue Toprol XL, amlodipine and Entresto Net IO Since Admission: -81,191.49 mL [06/23/23 1140] Continue to monitor for daily intake output, weight, blood pressure, BNP, renal function and electrolytes. Recent Labs  Lab 06/19/23 0517  BUN 6  CREATININE 0.86  NA 135  K 4.2  MG 1.7    Type 2 diabetes mellitus A1c 6.8 in November 2024 PTA meds-not on insulin Blood sugar level was elevated last week.  Alan Tapia states it was elevated last week because he was eating a lot for his birthday. Currently on SSI Accu-Cheks. fingersticks consistently under 200. No longer getting fingerstick monitoring.  Encourage dietary discipline.  Recurrent diarrhea Likely due to aggressive bowel regimen.  Diarrhea improved after bowel regimen was stopped.   Abdominal wall edema Mostly to left side of abdomen with resultant fluid filled striae and mild dermatitis.  Alan Tapia declining diuresis at this  time. Improved with changing positions.   Hemorrhoids Treated with Anusol cream.   Community acquired pneumonia Alan Tapia was treated with Zosyn and doxycycline. Resolved.   Acute on chronic anemia Alan Tapia with hemoglobin drop down to 5.9 g/dL secondary to right foot wound, requiring 3 units of PRBC on 11/26. Alan Tapia requiring another 1 unit of PRBC on 11/30. Hgb now stable around 9-10's.   COVID-19 infection Alan Tapia completed isolation protocol. Alan Tapia treated with Paxlovid.   Constipation Holding bowel regimen secondary to worsening diarrhea.   Pressure injury Bilateral perineum, unclear if present on admission.   Fall 2/6, fell out of bed.  No obvious injury.  Chest x-ray without any evidence of rib fracture.    Mobility: Limited due to bilateral BKA status  Goals of care   Code Status: Full Code     DVT prophylaxis: Lovenox subcu    Antimicrobials: None currently Fluid: None Consultants: None Family Communication: None at bedside  Status: Inpatient Level of care:  Med-Surg   Alan Tapia is from: Home Anticipated d/c to: Pending SNF.  Long length of stay Alan Tapia    Diet:  Diet Order             Diet regular Room service appropriate? Yes; Fluid consistency: Thin  Diet effective now           Diet - low sodium heart healthy                   Scheduled Meds:  amLODipine  5 mg Oral Daily   enoxaparin (LOVENOX) injection  60 mg Subcutaneous Daily   feeding supplement  237 mL Oral BID BM  Gerhardt's butt cream   Topical BID    HYDROmorphone (DILAUDID) injection  0.5 mg Intravenous Once   lidocaine  2 patch Transdermal Q24H   melatonin  5 mg Oral QHS   metoprolol succinate  200 mg Oral Daily   pantoprazole  40 mg Oral BID   sacubitril-valsartan  1 tablet Oral BID    PRN meds: acetaminophen, cloNIDine, dextromethorphan-guaiFENesin, diclofenac Sodium, hydrOXYzine, methocarbamol, ondansetron **OR** ondansetron (ZOFRAN) IV, mouth rinse, oxyCODONE,  oxyCODONE, phenol, polyethylene glycol, simethicone   Infusions:    Antimicrobials: Anti-infectives (From admission, onward)    Start     Dose/Rate Route Frequency Ordered Stop   04/30/23 1800  nirmatrelvir/ritonavir (PAXLOVID) 3 tablet        3 tablet Oral 2 times daily 04/30/23 1621 05/05/23 0820   04/09/23 1345  doxycycline (VIBRAMYCIN) 100 mg in dextrose 5 % 250 mL IVPB        100 mg 125 mL/hr over 120 Minutes Intravenous Every 12 hours 04/09/23 1252 04/09/23 1746   04/08/23 1300  levofloxacin (LEVAQUIN) IVPB 500 mg  Status:  Discontinued        500 mg 100 mL/hr over 60 Minutes Intravenous On call to O.R. 04/08/23 1229 04/08/23 1648   04/08/23 1300  vancomycin (VANCOREADY) IVPB 1500 mg/300 mL        1,500 mg 100 mL/hr over 180 Minutes Intravenous On call to O.R. 04/08/23 1229 04/08/23 1433   04/08/23 1237  vancomycin HCl (VANCOREADY) 1500 MG/300ML IVPB       Note to Pharmacy: [name] D: cabinet override      04/08/23 1237 04/08/23 1439   04/07/23 0800  piperacillin-tazobactam (ZOSYN) IVPB 3.375 g        3.375 g 12.5 mL/hr over 240 Minutes Intravenous Every 8 hours 04/07/23 0108 04/09/23 2108   04/07/23 0100  doxycycline (VIBRAMYCIN) 100 mg in dextrose 5 % 250 mL IVPB  Status:  Discontinued        100 mg 125 mL/hr over 120 Minutes Intravenous Every 12 hours 04/07/23 0057 04/09/23 1252   04/07/23 0000  ceFEPIme (MAXIPIME) 2 g in sodium chloride 0.9 % 100 mL IVPB  Status:  Discontinued        2 g 200 mL/hr over 30 Minutes Intravenous Every 8 hours 04/06/23 2349 04/07/23 0108   04/06/23 2330  metroNIDAZOLE (FLAGYL) IVPB 500 mg        500 mg 100 mL/hr over 60 Minutes Intravenous  Once 04/06/23 2321 04/07/23 0032   04/06/23 2300  piperacillin-tazobactam (ZOSYN) IVPB 3.375 g  Status:  Discontinued        3.375 g 100 mL/hr over 30 Minutes Intravenous  Once 04/06/23 2257 04/06/23 2320   04/06/23 2300  vancomycin (VANCOREADY) IVPB 2000 mg/400 mL  Status:  Discontinued         2,000 mg 200 mL/hr over 120 Minutes Intravenous  Once 04/06/23 2257 04/06/23 2312       Objective: Vitals:   06/23/23 0615 06/23/23 0833  BP: (!) 161/82 (!) 161/80  Pulse: 71 72  Resp: 18 17  Temp: 98.3 F (36.8 C) 98.3 F (36.8 C)  SpO2: 100% 100%   No intake or output data in the 24 hours ending 06/23/23 1140   Filed Weights   06/19/23 0559 06/20/23 0500 06/21/23 0438  Weight: 126.1 kg 130.8 kg 131.9 kg   Weight change:  Body mass index is 35.4 kg/m.   Physical Exam: General exam: Pleasant, middle-aged African-American male Skin: No rashes, lesions or  ulcers. HEENT: Atraumatic, normocephalic, no obvious bleeding Lungs: Clear to auscultation bilaterally, no tenderness on palpation CVS: S1, S2, no murmur,   GI/Abd: Soft, nontender, nondistended, bowel sound present,   CNS: Alert, awake, oriented x 3 Psychiatry: Mood appropriate,  Extremities: Bilateral BKA status  Data Review: I have personally reviewed the laboratory data and studies available.  F/u labs  Unresulted Labs (From admission, onward)    None      Total time spent in review of labs and imaging, Alan Tapia evaluation, formulation of plan, documentation and communication with family: 25 minutes  Signed, Lorin Glass, MD [facility] 06/23/2023

## 2023-06-23 NOTE — Progress Notes (Signed)
Occupational Therapy Treatment Patient Details Name: Alan Tapia. MRN: [id number] DOB: 04/22/1976 Today's Date: 06/23/2023   History of present illness The pt is a 48 yo male presenting 11/25 with nausea, vomiting, and fever as well as R foot wound. 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   OT comments  Patient making good gains with OT treatment with mod assist to donn shorts with patient able to pull up in front and requiring assistance to pull up around bottom. Patient required assistance with placement of sliding board and mod assist +2 to lateral scoot to wheelchair due to posterior leaning. Patient able to perform wheelchair mobility in hallway with min assist due to right wheel on wheelchair having difficulty making contact with floor. Patient assisted back to bed with lateral scoot to left. Patient to continue to be followed by acute OT to address established goals to facilitate DC to next venue of care.       If plan is discharge home, recommend the following:  A lot of help with walking and/or transfers;A lot of help with bathing/dressing/bathroom   Equipment Recommendations  Hoyer lift;Other (comment) (maxislide)    Recommendations for Other Services      Precautions / Restrictions Precautions Precautions: Fall Restrictions Weight Bearing Restrictions Per Provider Order: Yes RLE Weight Bearing Per Provider Order: Non weight bearing Other Position/Activity Restrictions: bilateral BKA (L chronic, R new)       Mobility Bed Mobility Overal bed mobility: Needs Assistance Bed Mobility: Rolling, Supine to Sit, Sit to Supine Rolling: Used rails, Supervision   Supine to sit: Supervision Sit to supine: Contact guard assist   General bed mobility comments: able to pull self up into long sitting in bed with  bed rails    Transfers Overall transfer level: Needs assistance Equipment used: Sliding board Transfers: Bed to chair/wheelchair/BSC            Lateral/Scoot Transfers: Mod assist, +2 physical assistance, With slide board General transfer comment: frequent cues for body mechanics and safety, patient tends to lean posteriorly during transfer     Balance Overall balance assessment: Needs assistance Sitting-balance support: Feet unsupported Sitting balance-Leahy Scale: Fair Sitting balance - Comments: on EOB                                   ADL either performed or assessed with clinical judgement   ADL Overall ADL's : Needs assistance/impaired         Upper Body Bathing: Set up;Bed level   Lower Body Bathing: Minimal assistance;Bed level       Lower Body Dressing: Moderate assistance;Bed level Lower Body Dressing Details (indicate cue type and reason): patient performed rolling right  and left to donn and doff short with assistance to pull up and pull off hips               General ADL Comments: donned shorts for transfers to/from wheelchair from/to EOB    Extremity/Trunk Assessment Upper Extremity Assessment Upper Extremity Assessment: Overall WFL for tasks assessed            Vision       Perception     Praxis     Communication Communication Communication: No apparent difficulties   Cognition Arousal:  Alert Behavior During Therapy: [facility] for tasks assessed/performed                                 Following commands: Intact        Cueing   Cueing Techniques: Verbal cues  Exercises Exercises: Other exercises Other Exercises Other Exercises: performed wheelchair mobility in hallway with min assist due to right wheel on wheelchair having difficulty making contact with floor.    Shoulder Instructions       General Comments VSS on RA    Pertinent Vitals/ Pain       Pain Assessment Pain Assessment:  Faces Faces Pain Scale: Hurts little more Pain Location: Abdomen Pain Descriptors / Indicators: Discomfort, Grimacing Pain Intervention(s): Limited activity within patient's tolerance, Monitored during session, Repositioned  Home Living                                          Prior Functioning/Environment              Frequency  Min 1X/week        Progress Toward Goals  OT Goals(current goals can now be found in the care plan section)  Progress towards OT goals: Progressing toward goals  Acute Rehab OT Goals Patient Stated Goal: transfer better OT Goal Formulation: With patient Time For Goal Achievement: 06/25/23 Potential to Achieve Goals: Good ADL Goals Pt Will Perform Grooming: with modified independence;sitting Pt Will Perform Upper Body Bathing: with set-up;sitting Pt Will Perform Upper Body Dressing: with modified independence;sitting Pt Will Perform Lower Body Dressing: with min assist;sitting/lateral leans;bed level Pt Will Transfer to Toilet: with mod assist;with +2 assist;anterior/posterior transfer;bedside commode Pt Will Perform Toileting - Clothing Manipulation and hygiene: with min assist;sitting/lateral leans;bed level Pt/caregiver will Perform Home Exercise Program: Increased strength;Both right and left upper extremity;Independently;With written HEP provided Additional ADL Goal #1: Pt to don B shrinkers Independently Additional ADL Goal #2: Caregiver to demonstrate safest way to assist pt with ADLs/transfers to decrease fall risk at home  Plan      Co-evaluation                 AM-PAC OT "6 Clicks" Daily Activity     Outcome Measure   Help from another person eating meals?: A Little Help from another person taking care of personal grooming?: A Little Help from another person toileting, which includes using toliet, bedpan, or urinal?: A Lot Help from another person bathing (including washing, rinsing, drying)?: A Lot Help  from another person to put on and taking off regular upper body clothing?: A Little Help from another person to put on and taking off regular lower body clothing?: A Lot 6 Click Score: 15    End of Session Equipment Utilized During Treatment: Gait belt;Other (comment) (sliding board)  OT Visit Diagnosis: Other abnormalities of gait and mobility (R26.89);Muscle weakness (generalized) (M62.81);Other (comment)   Activity Tolerance Patient tolerated treatment well   Patient Left in bed;with call bell/phone within reach   Nurse Communication Mobility status;Patient requests pain meds        Time: 5366-4403 OT Time Calculation (min): 33 min  Charges: OT General Charges $OT Visit: 1 Visit OT Treatments $Self Care/Home Management : 8-22 mins $Therapeutic Activity: 8-22 mins  Alfonse Flavors, OTA Acute Rehabilitation Services  Office [phone]   Lolah Coghlan L  Cornelius Moras 06/23/2023, 4:09 PM

## 2023-06-23 NOTE — Plan of Care (Signed)
  Problem: Fluid Volume: Goal: Hemodynamic stability will improve Outcome: Progressing   Problem: Clinical Measurements: Goal: Will remain free from infection Outcome: Progressing   Problem: Pain Management: Goal: General experience of comfort will improve Outcome: Progressing   Problem: Education: Goal: Knowledge of General Education information will improve Description: Including pain rating scale, medication(s)/side effects and non-pharmacologic comfort measures Outcome: Progressing

## 2023-06-24 DIAGNOSIS — N179 Acute kidney failure, unspecified: Secondary | ICD-10-CM | POA: Diagnosis not present

## 2023-06-24 DIAGNOSIS — A419 Sepsis, unspecified organism: Secondary | ICD-10-CM | POA: Diagnosis not present

## 2023-06-24 DIAGNOSIS — R6520 Severe sepsis without septic shock: Secondary | ICD-10-CM | POA: Diagnosis not present

## 2023-06-24 LAB — GLUCOSE, CAPILLARY
Glucose-Capillary: 113 mg/dL — ABNORMAL HIGH (ref 70–99)
Glucose-Capillary: 153 mg/dL — ABNORMAL HIGH (ref 70–99)
Glucose-Capillary: 113 mg/dL — ABNORMAL HIGH (ref 70–99)
Glucose-Capillary: 125 mg/dL — ABNORMAL HIGH (ref 70–99)

## 2023-06-24 MED ORDER — SENNOSIDES-DOCUSATE SODIUM 8.6-50 MG PO TABS
1.0000 | ORAL_TABLET | Freq: Every evening | ORAL | Status: DC | PRN
  Administered 2023-06-24 – 2023-06-27 (×2): 1 via ORAL
  Filled 2023-06-24 (×3): qty 1

## 2023-06-24 NOTE — Progress Notes (Signed)
PROGRESS NOTE  Alan Tapia.  DOB: February 29, 1976  PCP: Patient, No Pcp Per UJW:[id number]  DOA: 04/06/2023  LOS: 78 days  Hospital Day: 80  Brief narrative: Alan Mirsky. is a 48 y.o. male with PMH significant for DM2, HTN, HLD, combined CHF with EF 30%, DCM, peripheral neuropathy, left BKA status. 04/06/2023, patient presented to the ED with complaint of order from his right foot wound.  Admitted to [facility] for sepsis secondary to right foot osteomyelitis 11/27, underwent right BKA by Dr. Lajoyce Corners.  His hospitalization is prolonged because of unclear disposition plan. Medically stable Case management following.  Subjective: Patient was seen and examined this morning. Sitting up in bed. Not in distress. No new symptoms. No essential change in last several days  Assessment and plan:  Sepsis POA  right foot osteomyelitis s/p right BKA H/o left BKA Patient was initially treated empirically with antibiotics. Orthopedic surgery performed a right BKA on 11/27. Antibiotics discontinued. PT recommended SNF   Chronic combined systolic and diastolic heart failure Essential hypertension Last echo 1/3 with EF 30% Continue Toprol XL, amlodipine and Entresto Net IO Since Admission: -18,628.49 mL [06/24/23 1255] Continue to monitor for daily intake output, weight, blood pressure, BNP, renal function and electrolytes. Recent Labs  Lab 06/19/23 0517  BUN 6  CREATININE 0.86  NA 135  K 4.2  MG 1.7    Type 2 diabetes mellitus A1c 6.8 in November 2024 PTA meds-not on insulin Blood sugar level was elevated last week.  Patient states it was elevated last week because he was eating a lot for his birthday. He was started on SSI Accu-Cheks. fingersticks improved consistently under 200. No longer getting fingerstick monitoring.  Encourage dietary discipline.  Recurrent diarrhea Likely due to aggressive bowel regimen.  Diarrhea improved after bowel regimen was stopped.   Abdominal wall  edema Mostly to left side of abdomen with resultant fluid filled striae and mild dermatitis.  Patient declining diuresis at this time. Improved with changing positions.   Hemorrhoids Treated with Anusol cream.   Community acquired pneumonia Patient completed the course of Zosyn and doxycycline. Resolved.   Acute on chronic anemia Patient with hemoglobin drop down to 5.9 g/dL secondary to right foot wound, requiring 3 units of PRBC on 11/26. Patient requiring another 1 unit of PRBC on 11/30. Hgb now stable around 9-10's.   COVID-19 infection Patient completed isolation protocol. Patient treated with Paxlovid.   Constipation Holding bowel regimen secondary to worsening diarrhea.   Pressure injury Bilateral perineum, unclear if present on admission.   Fall 2/6, fell out of bed.  No obvious injury.  Chest x-ray without any evidence of rib fracture.    Mobility: Limited due to bilateral BKA status  Goals of care   Code Status: Full Code     DVT prophylaxis: Lovenox subcu    Antimicrobials: None currently Fluid: None Consultants: None Family Communication: None at bedside  Status: Inpatient Level of care:  Med-Surg   Patient is from: Home Anticipated d/c to: Pending SNF.  Long length of stay patient.  Difficulty with disposition.  Case management following.   Diet:  Diet Order             Diet regular Room service appropriate? Yes; Fluid consistency: Thin  Diet effective now           Diet - low sodium heart healthy                   Scheduled Meds:  amLODipine  5 mg Oral Daily   enoxaparin (LOVENOX) injection  60 mg Subcutaneous Daily   feeding supplement  237 mL Oral BID BM   Gerhardt's butt cream   Topical BID    HYDROmorphone (DILAUDID) injection  0.5 mg Intravenous Once   lidocaine  2 patch Transdermal Q24H   melatonin  5 mg Oral QHS   metoprolol succinate  200 mg Oral Daily   pantoprazole  40 mg Oral BID   sacubitril-valsartan  1 tablet Oral  BID    PRN meds: acetaminophen, cloNIDine, dextromethorphan-guaiFENesin, diclofenac Sodium, hydrOXYzine, methocarbamol, ondansetron **OR** ondansetron (ZOFRAN) IV, mouth rinse, oxyCODONE, oxyCODONE, phenol, polyethylene glycol, simethicone   Infusions:    Antimicrobials: Anti-infectives (From admission, onward)    Start     Dose/Rate Route Frequency Ordered Stop   04/30/23 1800  nirmatrelvir/ritonavir (PAXLOVID) 3 tablet        3 tablet Oral 2 times daily 04/30/23 1621 05/05/23 0820   04/09/23 1345  doxycycline (VIBRAMYCIN) 100 mg in dextrose 5 % 250 mL IVPB        100 mg 125 mL/hr over 120 Minutes Intravenous Every 12 hours 04/09/23 1252 04/09/23 1746   04/08/23 1300  levofloxacin (LEVAQUIN) IVPB 500 mg  Status:  Discontinued        500 mg 100 mL/hr over 60 Minutes Intravenous On call to O.R. 04/08/23 1229 04/08/23 1648   04/08/23 1300  vancomycin (VANCOREADY) IVPB 1500 mg/300 mL        1,500 mg 100 mL/hr over 180 Minutes Intravenous On call to O.R. 04/08/23 1229 04/08/23 1433   04/08/23 1237  vancomycin HCl (VANCOREADY) 1500 MG/300ML IVPB       Note to Pharmacy: [name] D: cabinet override      04/08/23 1237 04/08/23 1439   04/07/23 0800  piperacillin-tazobactam (ZOSYN) IVPB 3.375 g        3.375 g 12.5 mL/hr over 240 Minutes Intravenous Every 8 hours 04/07/23 0108 04/09/23 2108   04/07/23 0100  doxycycline (VIBRAMYCIN) 100 mg in dextrose 5 % 250 mL IVPB  Status:  Discontinued        100 mg 125 mL/hr over 120 Minutes Intravenous Every 12 hours 04/07/23 0057 04/09/23 1252   04/07/23 0000  ceFEPIme (MAXIPIME) 2 g in sodium chloride 0.9 % 100 mL IVPB  Status:  Discontinued        2 g 200 mL/hr over 30 Minutes Intravenous Every 8 hours 04/06/23 2349 04/07/23 0108   04/06/23 2330  metroNIDAZOLE (FLAGYL) IVPB 500 mg        500 mg 100 mL/hr over 60 Minutes Intravenous  Once 04/06/23 2321 04/07/23 0032   04/06/23 2300  piperacillin-tazobactam (ZOSYN) IVPB 3.375 g  Status:   Discontinued        3.375 g 100 mL/hr over 30 Minutes Intravenous  Once 04/06/23 2257 04/06/23 2320   04/06/23 2300  vancomycin (VANCOREADY) IVPB 2000 mg/400 mL  Status:  Discontinued        2,000 mg 200 mL/hr over 120 Minutes Intravenous  Once 04/06/23 2257 04/06/23 2312       Objective: Vitals:   06/24/23 0915 06/24/23 0916  BP: (!) 159/85 (!) 159/85  Pulse: 68   Resp:    Temp:    SpO2:      Intake/Output Summary (Last 24 hours) at 06/24/2023 1255 Last data filed at 06/23/2023 2300 Gross per 24 hour  Intake --  Output 700 ml  Net -700 ml     [employer]  06/20/23 0500 06/21/23 0438 06/24/23 0500  Weight: 130.8 kg 131.9 kg 131.9 kg   Weight change:  Body mass index is 35.4 kg/m.   Physical Exam: General exam: Pleasant, middle-aged African-American male Skin: No rashes, lesions or ulcers. HEENT: Atraumatic, normocephalic, no obvious bleeding Lungs: Clear to auscultation bilaterally, no tenderness on palpation CVS: S1, S2, no murmur,   GI/Abd: Soft, nontender, nondistended, bowel sound present,   CNS: Alert, awake, oriented x 3 Psychiatry: Mood appropriate,  Extremities: Bilateral BKA status  Data Review: I have personally reviewed the laboratory data and studies available.  F/u labs  Unresulted Labs (From admission, onward)    None      Total time spent in review of labs and imaging, patient evaluation, formulation of plan, documentation and communication with family: 25 minutes  Signed, Lorin Glass, MD [facility] 06/24/2023

## 2023-06-24 NOTE — TOC Progression Note (Signed)
Transition of Care (TOC) - Progression Note    Patient Details  Name: Alan Tapia. MRN: [id number] Date of Birth: March 21, 1976  Transition of Care [facility]) CM/SW Contact  Carley Hammed, LCSW Phone Number: 06/24/2023, 3:00 PM  Clinical Narrative:     CSW received an email from DSS with confirmation information for disability. CSW advised [name] who requested case information for Medicaid pending status. This was requested from DSS. CSW met with pt at bedside to update him. Pt requested CSW speak with [facility] as he has not heard back on his prosthetics.  CSW called [facility] and was advised that the spouse has been calling and stating he was being discharged, facility was unsure of what the spouse wanted. CSW spoke with Zollie Scale who notes she was told pt had discharged and pt would follow up. CSW requested [facility] speak with pt directly at pt's request, Zollie Scale to meet with pt on Thursday or Friday. CSW was advised by unit director and Hale Bogus with DSS that spouse was also contacting them with the same information. CSW advised that pt is not discharging home and he has requested we speak with him only. Per unit director, spouse states she has pt's SSI check at home. CSW advised [name] of this and he states he has not spoken with Spouse. He says he isn't sure if the SSI check is even real. CSW requested he get as much info as possible about the amounts for the facility to know. Pt expressing frustration with the situation but glad their has been movement in the case. TOC will continue to follow.   Expected Discharge Plan: Skilled Nursing Facility Barriers to Discharge: Insurance Authorization, Continued Medical Work up, SNF Pending bed offer  Expected Discharge Plan and Services In-house Referral: Clinical Social Work     Living arrangements for the past 2 months: Single Family Home Expected Discharge Date: 04/11/23                                     Social Determinants of Health  (SDOH) Interventions SDOH Screenings   Food Insecurity: No Food Insecurity (04/07/2023)  Housing: Unknown (04/23/2023)  Recent Concern: Housing - Medium Risk (04/07/2023)  Transportation Needs: No Transportation Needs (04/07/2023)  Recent Concern: Transportation Needs - Unmet Transportation Needs (02/17/2023)   Received from [facility]  Utilities: At Risk (04/07/2023)  Alcohol Screen: Low Risk  (08/20/2022)  Financial Resource Strain: Patient Unable To Answer (08/20/2022)  Tobacco Use: Low Risk  (04/08/2023)    Readmission Risk Interventions    04/07/2023    2:34 PM  Readmission Risk Prevention Plan  Transportation Screening Complete  PCP or Specialist Appt within 3-5 Days Complete  HRI or Home Care Consult Complete  Social Work Consult for Recovery Care Planning/Counseling Complete  Palliative Care Screening Not Applicable  Medication Review [employer]) Complete

## 2023-06-24 NOTE — Progress Notes (Signed)
Physical Therapy Treatment Patient Details Name: Alan Tapia. MRN: [id number] DOB: 05-Jul-1975 Today's Date: 06/24/2023   History of Present Illness The pt is a 48 yo male presenting 11/25 with nausea, vomiting, and fever as well as R foot wound. 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    PT Comments  Session focused on upper body, bilateral residual limb and core strengthening to improve transfers and wheelchair mobility. Pt with good activity tolerance throughout. Will continue to progress as tolerated.    If plan is discharge home, recommend the following: Two people to help with walking and/or transfers;Assist for transportation;Assistance with cooking/housework;Help with stairs or ramp for entrance   Can travel by private vehicle     No  Equipment Recommendations  Wheelchair cushion (measurements PT);Wheelchair (measurements PT);Hospital bed;Other (comment) (slide board)    Recommendations for Other Services       Precautions / Restrictions Precautions Precautions: Fall Restrictions Weight Bearing Restrictions Per Provider Order: Yes RLE Weight Bearing Per Provider Order: Non weight bearing Other Position/Activity Restrictions: bilateral BKA (L chronic, R new)     Mobility  Bed Mobility Overal bed mobility: Needs Assistance Bed Mobility: Supine to Sit     Supine to sit: Supervision     General bed mobility comments: able to pull self up into long sitting in bed with bed rails    Transfers                        Ambulation/Gait                   Stairs             Wheelchair Mobility     Tilt Bed    Modified Rankin (Stroke Patients Only)       Balance Overall balance assessment: Needs assistance Sitting-balance support: Feet unsupported Sitting  balance-Leahy Scale: Fair Sitting balance - Comments: on EOB                                    Communication Communication Communication: No apparent difficulties  Cognition Arousal: Alert Behavior During Therapy: WFL for tasks assessed/performed   PT - Cognitive impairments: No apparent impairments                         Following commands: Intact      Cueing Cueing Techniques: Verbal cues  Exercises General Exercises - Upper Extremity Shoulder Extension: AROM, Both, 10 reps, Theraband, Seated (3 sets) Theraband Level (Shoulder Extension): Level 4 (Blue) Elbow Flexion: Both, AROM, 10 reps, Seated, Theraband (3 sets) Theraband Level (Elbow Flexion): Level 4 (Blue) Elbow Extension: Strengthening, Both, Theraband, 10 reps, Other (comment) (3 sets) Theraband Level (Elbow Extension): Level 4 (Blue) Amputee Exercises Hip ABduction/ADduction: AROM, Both, 10 reps, Seated, Other (comment) (theraband (blue), 3 sets) Other Exercises Other Exercises: Supine: core press downs 10 reps x 3 sets    General Comments        Pertinent Vitals/Pain Pain Assessment Pain Assessment: Faces Faces Pain Scale: Hurts a little bit Pain Location: Abdomen Pain Descriptors / Indicators: Discomfort, Grimacing Pain Intervention(s): Monitored during session    Home Living  Prior Function            PT Goals (current goals can now be found in the care plan section) Acute Rehab PT Goals Time For Goal Achievement: 07/08/23 Potential to Achieve Goals: Fair Progress towards PT goals: Progressing toward goals    Frequency    Min 1X/week      PT Plan      Co-evaluation              AM-PAC PT "6 Clicks" Mobility   Outcome Measure  Help needed turning from your back to your side while in a flat bed without using bedrails?: A Little Help needed moving from lying on your back to sitting on the side of a flat bed without  using bedrails?: A Little Help needed moving to and from a bed to a chair (including a wheelchair)?: A Lot Help needed standing up from a chair using your arms (e.g., wheelchair or bedside chair)?: Total Help needed to walk in hospital room?: Total Help needed climbing 3-5 steps with a railing? : Total 6 Click Score: 11    End of Session   Activity Tolerance: Patient tolerated treatment well Patient left: in bed;with call bell/phone within reach;with bed alarm set   PT Visit Diagnosis: Other abnormalities of gait and mobility (R26.89);Muscle weakness (generalized) (M62.81);Pain Pain - Right/Left: Right Pain - part of body: Leg     Time: 9604-5409 PT Time Calculation (min) (ACUTE ONLY): 41 min  Charges:    $Therapeutic Exercise: 23-37 mins $Therapeutic Activity: 8-22 mins PT General Charges $$ ACUTE PT VISIT: 1 Visit                     Lillia Pauls, PT, DPT Acute Rehabilitation Services Office [phone]    Norval Morton 06/24/2023, 4:28 PM

## 2023-06-24 NOTE — Plan of Care (Signed)
Problem: Fluid Volume: Goal: Hemodynamic stability will improve Outcome: Progressing   Problem: Clinical Measurements: Goal: Diagnostic test results will improve Outcome: Progressing Goal: Signs and symptoms of infection will decrease Outcome: Progressing   Problem: Respiratory: Goal: Ability to maintain adequate ventilation will improve Outcome: Progressing   Problem: Education: Goal: Knowledge of General Education information will improve Description: Including pain rating scale, medication(s)/side effects and non-pharmacologic comfort measures Outcome: Progressing   Problem: Health Behavior/Discharge Planning: Goal: Ability to manage health-related needs will improve Outcome: Progressing   Problem: Clinical Measurements: Goal: Ability to maintain clinical measurements within normal limits will improve Outcome: Progressing Goal: Will remain free from infection Outcome: Progressing Goal: Diagnostic test results will improve Outcome: Progressing Goal: Respiratory complications will improve Outcome: Progressing Goal: Cardiovascular complication will be avoided Outcome: Progressing   Problem: Activity: Goal: Risk for activity intolerance will decrease Outcome: Progressing   Problem: Nutrition: Goal: Adequate nutrition will be maintained Outcome: Progressing   Problem: Coping: Goal: Level of anxiety will decrease Outcome: Progressing   Problem: Elimination: Goal: Will not experience complications related to bowel motility Outcome: Progressing Goal: Will not experience complications related to urinary retention Outcome: Progressing   Problem: Pain Management: Goal: General experience of comfort will improve Outcome: Progressing   Problem: Safety: Goal: Ability to remain free from injury will improve Outcome: Progressing   Problem: Skin Integrity: Goal: Risk for impaired skin integrity will decrease Outcome: Progressing   Problem: Activity: Goal: Ability  to tolerate increased activity will improve Outcome: Progressing   Problem: Clinical Measurements: Goal: Ability to maintain a body temperature in the normal range will improve Outcome: Progressing   Problem: Respiratory: Goal: Ability to maintain adequate ventilation will improve Outcome: Progressing Goal: Ability to maintain a clear airway will improve Outcome: Progressing   Problem: Education: Goal: Knowledge of the prescribed therapeutic regimen will improve Outcome: Progressing Goal: Ability to verbalize activity precautions or restrictions will improve Outcome: Progressing Goal: Understanding of discharge needs will improve Outcome: Progressing   Problem: Activity: Goal: Ability to perform//tolerate increased activity and mobilize with assistive devices will improve Outcome: Progressing   Problem: Clinical Measurements: Goal: Postoperative complications will be avoided or minimized Outcome: Progressing   Problem: Self-Care: Goal: Ability to meet self-care needs will improve Outcome: Progressing   Problem: Self-Concept: Goal: Ability to maintain and perform role responsibilities to the fullest extent possible will improve Outcome: Progressing   Problem: Education: Goal: Knowledge of General Education information will improve Description: Including pain rating scale, medication(s)/side effects and non-pharmacologic comfort measures Outcome: Progressing   Problem: Health Behavior/Discharge Planning: Goal: Ability to manage health-related needs will improve Outcome: Progressing   Problem: Clinical Measurements: Goal: Ability to maintain clinical measurements within normal limits will improve Outcome: Progressing Goal: Will remain free from infection Outcome: Progressing Goal: Diagnostic test results will improve Outcome: Progressing Goal: Respiratory complications will improve Outcome: Progressing Goal: Cardiovascular complication will be avoided Outcome:  Progressing   Problem: Activity: Goal: Risk for activity intolerance will decrease Outcome: Progressing   Problem: Nutrition: Goal: Adequate nutrition will be maintained Outcome: Progressing   Problem: Coping: Goal: Level of anxiety will decrease Outcome: Progressing   Problem: Elimination: Goal: Will not experience complications related to bowel motility Outcome: Progressing Goal: Will not experience complications related to urinary retention Outcome: Progressing   Problem: Pain Management: Goal: General experience of comfort will improve Outcome: Progressing   Problem: Safety: Goal: Ability to remain free from injury will improve Outcome: Progressing   Problem: Skin Integrity: Goal: Risk for  impaired skin integrity will decrease Outcome: Progressing

## 2023-06-25 DIAGNOSIS — Z89511 Acquired absence of right leg below knee: Secondary | ICD-10-CM | POA: Diagnosis not present

## 2023-06-25 DIAGNOSIS — D649 Anemia, unspecified: Secondary | ICD-10-CM | POA: Diagnosis not present

## 2023-06-25 DIAGNOSIS — M86171 Other acute osteomyelitis, right ankle and foot: Secondary | ICD-10-CM | POA: Diagnosis not present

## 2023-06-25 LAB — GLUCOSE, CAPILLARY
Glucose-Capillary: 87 mg/dL (ref 70–99)
Glucose-Capillary: 90 mg/dL (ref 70–99)
Glucose-Capillary: 123 mg/dL — ABNORMAL HIGH (ref 70–99)

## 2023-06-25 MED ORDER — OXYCODONE HCL 5 MG PO TABS
5.0000 mg | ORAL_TABLET | ORAL | Status: DC | PRN
  Administered 2023-06-26 – 2023-06-28 (×9): 5 mg via ORAL
  Filled 2023-06-25 (×9): qty 1

## 2023-06-25 NOTE — Progress Notes (Signed)
PROGRESS NOTE  [name]  DOB: 06/26/1975  PCP: Patient, No Pcp Per ZOX:[id number]  DOA: 04/06/2023  LOS: 79 days  Hospital Day: 81  Brief narrative: Damiano Stamper. is a 48 y.o. male with PMH significant for DM2, HTN, HLD, combined CHF with EF 30%, DCM, peripheral neuropathy, left BKA status. 04/06/2023, patient presented to the ED with complaint of odor from his right foot wound.  Admitted to [facility] for sepsis secondary to right foot osteomyelitis 11/27, underwent right BKA by Dr. Lajoyce Corners.  His hospitalization is prolonged because of unclear disposition plan. Medically stable Case management following.  Subjective:  Patient seen and examined.  No overnight events.  Discussed with him that he is about 80 days from surgery and should taper off oxycodone and patient is agreeable.  Assessment and plan:  Sepsis POA  right foot osteomyelitis s/p right BKA H/o left BKA Patient was initially treated empirically with antibiotics. Orthopedic surgery performed a right BKA on 11/27. Antibiotics discontinued. PT recommended SNF   Chronic combined systolic and diastolic heart failure Essential hypertension Last echo 1/3 with EF 30% Continue Toprol XL, amlodipine and Entresto Net IO Since Admission: -18,388.49 mL [06/25/23 1403] Continue to monitor for daily intake output, weight, blood pressure, BNP, renal function and electrolytes. Recent Labs  Lab 06/19/23 0517  BUN 6  CREATININE 0.86  NA 135  K 4.2  MG 1.7    Type 2 diabetes mellitus A1c 6.8 in November 2024 PTA meds-not on insulin He was started on SSI Accu-Cheks. fingersticks improved consistently under 200. No longer getting fingerstick monitoring.  Encourage dietary discipline.  Recurrent diarrhea Likely due to aggressive bowel regimen.  Diarrhea improved after bowel regimen was stopped.   Abdominal wall edema Mostly to left side of abdomen with resultant fluid filled striae and mild dermatitis.  Patient  declining diuresis at this time. Improved with changing positions.   Hemorrhoids Treated with Anusol cream.   Community acquired pneumonia Patient completed the course of Zosyn and doxycycline. Resolved.   Acute on chronic anemia Patient with hemoglobin drop down to 5.9 g/dL secondary to right foot wound, requiring 3 units of PRBC on 11/26. Patient requiring another 1 unit of PRBC on 11/30. Hgb now stable around 9-10's.   COVID-19 infection Patient completed isolation protocol. Patient treated with Paxlovid.   Constipation Holding bowel regimen secondary to worsening diarrhea.   Pressure injury Bilateral perineum, unclear if present on admission.   Fall 2/6, fell out of bed.  No obvious injury.  Chest x-ray without any evidence of rib fracture.    Mobility: Limited due to bilateral BKA status.  Total assist.  Michiel Sites lift.  Goals of care   Code Status: Full Code     DVT prophylaxis: Lovenox subcu    Antimicrobials: None currently Fluid: None Consultants: None Family Communication: None at bedside  Status: Inpatient Level of care:  Med-Surg   Patient is from: Home Anticipated d/c to: Pending SNF.  Long length of stay patient.  Difficulty with disposition.  Case management following.   Diet:  Diet Order             Diet regular Room service appropriate? Yes; Fluid consistency: Thin  Diet effective now           Diet - low sodium heart healthy                   Scheduled Meds:  amLODipine  5 mg Oral Daily   enoxaparin (LOVENOX) injection  60 mg Subcutaneous Daily   feeding supplement  237 mL Oral BID BM   Gerhardt's butt cream   Topical BID   lidocaine  2 patch Transdermal Q24H   melatonin  5 mg Oral QHS   metoprolol succinate  200 mg Oral Daily   pantoprazole  40 mg Oral BID   sacubitril-valsartan  1 tablet Oral BID    PRN meds: acetaminophen, cloNIDine, dextromethorphan-guaiFENesin, diclofenac Sodium, hydrOXYzine, methocarbamol, ondansetron  **OR** ondansetron (ZOFRAN) IV, mouth rinse, oxyCODONE, phenol, senna-docusate, simethicone   Infusions:    Antimicrobials: Anti-infectives (From admission, onward)    Start     Dose/Rate Route Frequency Ordered Stop   04/30/23 1800  nirmatrelvir/ritonavir (PAXLOVID) 3 tablet        3 tablet Oral 2 times daily 04/30/23 1621 05/05/23 0820   04/09/23 1345  doxycycline (VIBRAMYCIN) 100 mg in dextrose 5 % 250 mL IVPB        100 mg 125 mL/hr over 120 Minutes Intravenous Every 12 hours 04/09/23 1252 04/09/23 1746   04/08/23 1300  levofloxacin (LEVAQUIN) IVPB 500 mg  Status:  Discontinued        500 mg 100 mL/hr over 60 Minutes Intravenous On call to O.R. 04/08/23 1229 04/08/23 1648   04/08/23 1300  vancomycin (VANCOREADY) IVPB 1500 mg/300 mL        1,500 mg 100 mL/hr over 180 Minutes Intravenous On call to O.R. 04/08/23 1229 04/08/23 1433   04/08/23 1237  vancomycin HCl (VANCOREADY) 1500 MG/300ML IVPB       Note to Pharmacy: [name] D: cabinet override      04/08/23 1237 04/08/23 1439   04/07/23 0800  piperacillin-tazobactam (ZOSYN) IVPB 3.375 g        3.375 g 12.5 mL/hr over 240 Minutes Intravenous Every 8 hours 04/07/23 0108 04/09/23 2108   04/07/23 0100  doxycycline (VIBRAMYCIN) 100 mg in dextrose 5 % 250 mL IVPB  Status:  Discontinued        100 mg 125 mL/hr over 120 Minutes Intravenous Every 12 hours 04/07/23 0057 04/09/23 1252   04/07/23 0000  ceFEPIme (MAXIPIME) 2 g in sodium chloride 0.9 % 100 mL IVPB  Status:  Discontinued        2 g 200 mL/hr over 30 Minutes Intravenous Every 8 hours 04/06/23 2349 04/07/23 0108   04/06/23 2330  metroNIDAZOLE (FLAGYL) IVPB 500 mg        500 mg 100 mL/hr over 60 Minutes Intravenous  Once 04/06/23 2321 04/07/23 0032   04/06/23 2300  piperacillin-tazobactam (ZOSYN) IVPB 3.375 g  Status:  Discontinued        3.375 g 100 mL/hr over 30 Minutes Intravenous  Once 04/06/23 2257 04/06/23 2320   04/06/23 2300  vancomycin (VANCOREADY) IVPB 2000  mg/400 mL  Status:  Discontinued        2,000 mg 200 mL/hr over 120 Minutes Intravenous  Once 04/06/23 2257 04/06/23 2312       Objective: Vitals:   06/25/23 0745 06/25/23 0933  BP: (!) 145/75 (!) 145/75  Pulse: 65 65  Resp: 17   Temp: 97.9 F (36.6 C)   SpO2: 99%     Intake/Output Summary (Last 24 hours) at 06/25/2023 1403 Last data filed at 06/24/2023 1800 Gross per 24 hour  Intake 240 ml  Output --  Net 240 ml     Filed Weights   06/21/23 0438 06/24/23 0500 06/25/23 0352  Weight: 131.9 kg 131.9 kg 131.9 kg   Weight change: 0 kg  Body mass index is 35.4 kg/m.   Physical Exam: General exam: Pleasant, interactive. Skin: No rashes, lesions or ulcers. HEENT: Atraumatic, normocephalic, no obvious bleeding Lungs: Clear to auscultation bilaterally, no tenderness on palpation CVS: S1, S2, no murmur,   GI/Abd: Soft, nontender, nondistended, bowel sound present,   CNS: Alert, awake, oriented x 3 Psychiatry: Mood appropriate,  Extremities: Bilateral BKA status.  Stump clean and dry.  Data Review: I have personally reviewed the laboratory data and studies available.  F/u labs  Unresulted Labs (From admission, onward)    None      Total time spent in review of labs and imaging, patient evaluation, formulation of plan, documentation and communication with family: 25 minutes  Signed, Dorcas Carrow, MD [facility] 06/25/2023

## 2023-06-25 NOTE — Plan of Care (Signed)
Problem: Health Behavior/Discharge Planning: Goal: Ability to manage health-related needs will improve Outcome: Progressing   Problem: Nutrition: Goal: Adequate nutrition will be maintained Outcome: Progressing   Problem: Coping: Goal: Level of anxiety will decrease Outcome: Progressing

## 2023-06-26 DIAGNOSIS — Z89511 Acquired absence of right leg below knee: Secondary | ICD-10-CM | POA: Diagnosis not present

## 2023-06-26 DIAGNOSIS — M86171 Other acute osteomyelitis, right ankle and foot: Secondary | ICD-10-CM | POA: Diagnosis not present

## 2023-06-26 DIAGNOSIS — D649 Anemia, unspecified: Secondary | ICD-10-CM | POA: Diagnosis not present

## 2023-06-26 LAB — GLUCOSE, CAPILLARY: Glucose-Capillary: 101 mg/dL — ABNORMAL HIGH (ref 70–99)

## 2023-06-26 MED ORDER — VASOPRESSIN 20 UNIT/ML IV SOLN
INTRAVENOUS | Status: AC
  Filled 2023-06-26: qty 2

## 2023-06-26 MED ORDER — PHENYLEPHRINE HCL-NACL 20-0.9 MG/250ML-% IV SOLN
INTRAVENOUS | Status: AC
  Filled 2023-06-26: qty 750

## 2023-06-26 MED ORDER — PROPOFOL 1000 MG/100ML IV EMUL
INTRAVENOUS | Status: AC
  Filled 2023-06-26: qty 300

## 2023-06-26 NOTE — Progress Notes (Signed)
PROGRESS NOTE  Alan Tapia.  DOB: 28-Sep-1975  PCP: Patient, No Pcp Per ZOX:[id number]  DOA: 04/06/2023  LOS: 80 days  Hospital Day: 82  Brief narrative: Alan Dy. is a 48 y.o. male with PMH significant for DM2, HTN, HLD, combined CHF with EF 30%, DCM, peripheral neuropathy, left BKA status. 04/06/2023, patient presented to the ED with complaint of odor from his right foot wound.  Admitted to [facility] for sepsis secondary to right foot osteomyelitis 11/27, underwent right BKA by Dr. Lajoyce Corners.  His hospitalization is prolonged because of unclear disposition plan. Medically stable Case management following.  Subjective:  Patient seen and examined.  Wife at the bedside.  Denies any complaints. He was able to secure a SNF bed.  Insurance authorization has been started.  Assessment and plan:  Sepsis POA  right foot osteomyelitis s/p right BKA H/o left BKA Patient was initially treated empirically with antibiotics. Orthopedic surgery performed a right BKA on 11/27. Antibiotics discontinued. PT recommended SNF   Chronic combined systolic and diastolic heart failure Essential hypertension Last echo 1/3 with EF 30% Continue Toprol XL, amlodipine and Entresto Net IO Since Admission: -19,048.49 mL [06/26/23 1359] Continue to monitor for daily intake output, weight, blood pressure, BNP, renal function and electrolytes. No results for input(s): "BNP", "BUN", "CREATININE", "NA", "K", "MG" in the last 168 hours.   Type 2 diabetes mellitus A1c 6.8 in November 2024 PTA meds-not on insulin He was started on SSI Accu-Cheks. fingersticks improved consistently under 200. No longer getting fingerstick monitoring.  Encourage dietary discipline.  Recurrent diarrhea Likely due to aggressive bowel regimen.  Diarrhea improved after bowel regimen was stopped.   Abdominal wall edema Mostly to left side of abdomen with resultant fluid filled striae and mild dermatitis.  Patient declining  diuresis at this time. Improved with changing positions.   Hemorrhoids Treated with Anusol cream.   Community acquired pneumonia Patient completed the course of Zosyn and doxycycline. Resolved.   Acute on chronic anemia Patient with hemoglobin drop down to 5.9 g/dL secondary to right foot wound, requiring 3 units of PRBC on 11/26. Patient requiring another 1 unit of PRBC on 11/30. Hgb now stable around 9-10's.   COVID-19 infection Patient completed isolation protocol. Patient treated with Paxlovid.   Constipation Holding bowel regimen secondary to worsening diarrhea.   Pressure injury Bilateral perineum, unclear if present on admission.   Fall 2/6, fell out of bed.  No obvious injury.  Chest x-ray without any evidence of rib fracture.    Mobility: Limited due to bilateral BKA status.  Total assist.  Michiel Sites lift.  Goals of care   Code Status: Full Code     DVT prophylaxis: Lovenox subcu    Antimicrobials: None currently Fluid: None Consultants: None Family Communication: None at bedside  Status: Inpatient Level of care:  Med-Surg   Patient is from: Home Anticipated d/c to: Pending SNF.  Long length of stay patient.  Difficulty with disposition.  Case management following.   Diet:  Diet Order             Diet regular Room service appropriate? Yes; Fluid consistency: Thin  Diet effective now           Diet - low sodium heart healthy                   Scheduled Meds:  amLODipine  5 mg Oral Daily   enoxaparin (LOVENOX) injection  60 mg Subcutaneous Daily   feeding supplement  237 mL Oral BID BM   Gerhardt's butt cream   Topical BID   lidocaine  2 patch Transdermal Q24H   melatonin  5 mg Oral QHS   metoprolol succinate  200 mg Oral Daily   pantoprazole  40 mg Oral BID   sacubitril-valsartan  1 tablet Oral BID    PRN meds: acetaminophen, cloNIDine, dextromethorphan-guaiFENesin, diclofenac Sodium, hydrOXYzine, methocarbamol, ondansetron **OR**  ondansetron (ZOFRAN) IV, mouth rinse, oxyCODONE, phenol, senna-docusate, simethicone   Infusions:    Antimicrobials: Anti-infectives (From admission, onward)    Start     Dose/Rate Route Frequency Ordered Stop   04/30/23 1800  nirmatrelvir/ritonavir (PAXLOVID) 3 tablet        3 tablet Oral 2 times daily 04/30/23 1621 05/05/23 0820   04/09/23 1345  doxycycline (VIBRAMYCIN) 100 mg in dextrose 5 % 250 mL IVPB        100 mg 125 mL/hr over 120 Minutes Intravenous Every 12 hours 04/09/23 1252 04/09/23 1746   04/08/23 1300  levofloxacin (LEVAQUIN) IVPB 500 mg  Status:  Discontinued        500 mg 100 mL/hr over 60 Minutes Intravenous On call to O.R. 04/08/23 1229 04/08/23 1648   04/08/23 1300  vancomycin (VANCOREADY) IVPB 1500 mg/300 mL        1,500 mg 100 mL/hr over 180 Minutes Intravenous On call to O.R. 04/08/23 1229 04/08/23 1433   04/08/23 1237  vancomycin HCl (VANCOREADY) 1500 MG/300ML IVPB       Note to Pharmacy: [name] D: cabinet override      04/08/23 1237 04/08/23 1439   04/07/23 0800  piperacillin-tazobactam (ZOSYN) IVPB 3.375 g        3.375 g 12.5 mL/hr over 240 Minutes Intravenous Every 8 hours 04/07/23 0108 04/09/23 2108   04/07/23 0100  doxycycline (VIBRAMYCIN) 100 mg in dextrose 5 % 250 mL IVPB  Status:  Discontinued        100 mg 125 mL/hr over 120 Minutes Intravenous Every 12 hours 04/07/23 0057 04/09/23 1252   04/07/23 0000  ceFEPIme (MAXIPIME) 2 g in sodium chloride 0.9 % 100 mL IVPB  Status:  Discontinued        2 g 200 mL/hr over 30 Minutes Intravenous Every 8 hours 04/06/23 2349 04/07/23 0108   04/06/23 2330  metroNIDAZOLE (FLAGYL) IVPB 500 mg        500 mg 100 mL/hr over 60 Minutes Intravenous  Once 04/06/23 2321 04/07/23 0032   04/06/23 2300  piperacillin-tazobactam (ZOSYN) IVPB 3.375 g  Status:  Discontinued        3.375 g 100 mL/hr over 30 Minutes Intravenous  Once 04/06/23 2257 04/06/23 2320   04/06/23 2300  vancomycin (VANCOREADY) IVPB 2000 mg/400  mL  Status:  Discontinued        2,000 mg 200 mL/hr over 120 Minutes Intravenous  Once 04/06/23 2257 04/06/23 2312       Objective: Vitals:   06/26/23 1009 06/26/23 1010  BP: (!) 149/77 (!) 149/77  Pulse:  68  Resp:    Temp:    SpO2:      Intake/Output Summary (Last 24 hours) at 06/26/2023 1359 Last data filed at 06/25/2023 1631 Gross per 24 hour  Intake 340 ml  Output 1000 ml  Net -660 ml     Filed Weights   06/24/23 0500 06/25/23 0352 06/26/23 0500  Weight: 131.9 kg 131.9 kg 131.9 kg   Weight change: 0 kg Body mass index is 35.4 kg/m.   Physical Exam: General  exam: Looks comfortable.  Pleasant to interaction. Skin: No rashes, lesions or ulcers. HEENT: Atraumatic, normocephalic, no obvious bleeding Lungs: Clear to auscultation bilaterally, no tenderness on palpation CVS: S1, S2, no murmur,   GI/Abd: Soft, nontender, nondistended, bowel sound present,   CNS: Alert, awake, oriented x 3 Psychiatry: Mood appropriate,  Extremities: Bilateral BKA status.  Stump clean and dry.  Data Review: I have personally reviewed the laboratory data and studies available.  F/u labs  Unresulted Labs (From admission, onward)    None      Total time spent in review of labs and imaging, patient evaluation, formulation of plan, documentation and communication with family: 25 minutes  Signed, Dorcas Carrow, MD [facility] 06/26/2023

## 2023-06-26 NOTE — TOC Progression Note (Addendum)
Transition of Care (TOC) - Progression Note    Patient Details  Name: Alan Tapia. MRN: [id number] Date of Birth: 1975/10/08  Transition of Care [facility]) CM/SW Contact  Carley Hammed, LCSW Phone Number: 06/26/2023, 11:10 AM  Clinical Narrative:    CSW spoke with DSS and [employer] and were able to confirm Pt's disability and change of program for Medicaid were approved. CSW spoke with facility who were able to confirm on their end. Rep sent pt's info to the financial counselors for [facility] to see if they can make a final offer on admission. TOC will continue to follow.   12:00 CSW spoke with [facility] who noted they will be coming up to do prosthetics at 430 and requested that therapies be involved. CSW messaged therapy teams. Update provided at bedside to pt. Spouse present. TOC will follow.  1:30 [facility] has offered a bed to pt, authorization to be started with new therapy notes. CSW updated pt and he is agreeable to plan. TOC will follow.  Expected Discharge Plan: Skilled Nursing Facility Barriers to Discharge: Insurance Authorization, Continued Medical Work up, SNF Pending bed offer  Expected Discharge Plan and Services In-house Referral: Clinical Social Work     Living arrangements for the past 2 months: Single Family Home Expected Discharge Date: 04/11/23                                     Social Determinants of Health (SDOH) Interventions SDOH Screenings   Food Insecurity: No Food Insecurity (04/07/2023)  Housing: Unknown (04/23/2023)  Recent Concern: Housing - Medium Risk (04/07/2023)  Transportation Needs: No Transportation Needs (04/07/2023)  Recent Concern: Transportation Needs - Unmet Transportation Needs (02/17/2023)   Received from [facility]  Utilities: At Risk (04/07/2023)  Alcohol Screen: Low Risk  (08/20/2022)  Financial Resource Strain: Patient Unable To Answer (08/20/2022)  Tobacco Use: Low Risk  (04/08/2023)    Readmission Risk  Interventions    04/07/2023    2:34 PM  Readmission Risk Prevention Plan  Transportation Screening Complete  PCP or Specialist Appt within 3-5 Days Complete  HRI or Home Care Consult Complete  Social Work Consult for Recovery Care Planning/Counseling Complete  Palliative Care Screening Not Applicable  Medication Review [employer]) Complete

## 2023-06-26 NOTE — Plan of Care (Signed)
  Problem: Nutrition: Goal: Adequate nutrition will be maintained Outcome: Progressing   Problem: Coping: Goal: Level of anxiety will decrease Outcome: Progressing   Problem: Health Behavior/Discharge Planning: Goal: Ability to manage health-related needs will improve Outcome: Progressing   Problem: Nutrition: Goal: Adequate nutrition will be maintained Outcome: Progressing

## 2023-06-26 NOTE — Plan of Care (Signed)
  Problem: Pain Management: Goal: General experience of comfort will improve Outcome: Progressing   Problem: Safety: Goal: Ability to remain free from injury will improve Outcome: Progressing

## 2023-06-26 NOTE — Progress Notes (Signed)
Occupational Therapy Treatment Patient Details Name: Alan Tapia. MRN: [id number] DOB: [birth year] Today's Date: 06/26/2023   History of present illness The pt is a 48 yo male presenting 11/25 with nausea, vomiting, and fever as well as R foot wound. 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   OT comments  Patient seen with OT and orthotist present for fitting L LE temporary prosthetic with OT focusing on training in techniques for increased safety and independence with ADLs and PT initiating training with OOB to wheelchair transfers with L LE prosthetic donned. Pt currently demonstrates ability to complete UB ADLs with Set up to Contact guard assist, LB ADLs with Min to Mod assist with increased time, and functional lateral-scoot transfers from bed<->w/c with L LE prosthetic donned with Mod assist +2. Pt participated well in session and is highly motivated to continue with skilled rehab to increase independence. Pt is making progress toward goals with OT goals updated this session based on pt progress and current functional level. Pt will benefit from continued acute skilled OT services to address deficits outlined below and increase safety and independence with functional tasks. Post acute discharge, pt will benefit from intensive inpatient skilled rehab services < 3 hours per day to maximize rehab potential.       If plan is discharge home, recommend the following:  Two people to help with walking and/or transfers;A lot of help with bathing/dressing/bathroom;Assistance with cooking/housework;Assist for transportation;Help with stairs or ramp for entrance   Equipment Recommendations  Hoyer lift (maxislide)    Recommendations for Other Services      Precautions / Restrictions Precautions Precautions: Fall Required Braces or  Orthoses: Other Brace Other Brace: shrinkers bilateral; orthotist brought temporary prosthetic for L LE       Mobility Bed Mobility Overal bed mobility: Needs Assistance Bed Mobility: Rolling Rolling: Min assist, Used rails   Supine to sit: Min assist Sit to supine: Contact guard assist   General bed mobility comments: rolling for hygiene and linen change, assist for safety; up to EOB with A for positioning bed pad under him; to supine pt rolling out of chair into bed due to pain and difficulty scooting or getting prosthetic limb under him to pivot    Transfers Overall transfer level: Needs assistance   Transfers: Bed to chair/wheelchair/BSC            Lateral/Scoot Transfers: Mod assist, +2 safety/equipment General transfer comment: wheelchair on R side and prosthetic on L LE pt placed R leg on seat of chair and used leg some though unable to lift enough to "pivot" so still scooting into chair; back to bed needing A though unable to "pivot" toward prosthetic leg with pain getting it under him so turned to scoot to R though pt still unable to get up to slightly higher sufface of bed so moved R leg onto bed and rolled out of chair into bed with A for safety; caregiver present and observing     Balance Overall balance assessment: Needs assistance Sitting-balance support: Single extremity supported, Bilateral upper extremity supported, Feet supported, Feet unsupported (feet unsupported and with L LE prostetic donned and supported) Sitting balance-Leahy Scale: Fair Sitting balance - Comments: on EOB and using UE's for dynamic weight shifting to apply shorts without foot support on while seated reaching for rails and  leaning on bed with HOB up                                   ADL either performed or assessed with clinical judgement   ADL Overall ADL's : Needs assistance/impaired Eating/Feeding: Set up;Bed level   Grooming: Set up;Supervision/safety;Sitting    Upper Body Bathing: Set up;Bed level   Lower Body Bathing: Minimal assistance;Moderate assistance;Sitting/lateral leans;Bed level   Upper Body Dressing : Contact guard assist;Bed level   Lower Body Dressing: Moderate assistance;Bed level Lower Body Dressing Details (indicate cue type and reason): patient performed rolling right  and left to donn and doff short with assistance to pull up and pull off hips Toilet Transfer: Moderate assistance;+2 for physical assistance;+2 for safety/equipment Toilet Transfer Details (indicate cue type and reason): simulated bed <-> w/c with prosthetic donned; see details unders "Transfers" Toileting- Clothing Manipulation and Hygiene: Minimal assistance;Sitting/lateral lean;Bed level;With caregiver independent assisting Toileting - Clothing Manipulation Details (indicate cue type and reason): with significantly increased time and effort; with pt declining therapist assist            Extremity/Trunk Assessment Upper Extremity Assessment Upper Extremity Assessment: Right hand dominant;Overall [facility] for tasks assessed   Lower Extremity Assessment Lower Extremity Assessment: Defer to PT evaluation        Vision       Perception     Praxis     Communication Communication Communication: No apparent difficulties   Cognition Arousal: Alert Behavior During Therapy: WFL for tasks assessed/performed               OT - Cognition Comments: Pt AAOx4 and pleasant throughout session. Cognition WFL for tasks assessed this session; not formally assessed.                 Following commands: Intact        Cueing   Cueing Techniques: Verbal cues (for technique and increased safety)  Exercises      Shoulder Instructions       General Comments significant other in the room and supportive throughout session; orthotist, Olivia, from [employer] present for prosthetic fitting of temporary prosthetic on L LE; able to fit and also applied new  shrinkers that did not roll down; pt demonstrating ability to donn new shrinkers with Min-Mod assist    Pertinent Vitals/ Pain       Pain Assessment Pain Assessment: Faces Faces Pain Scale: Hurts whole lot Pain Location: L LE with wearing prosthetic Pain Descriptors / Indicators: Discomfort, Grimacing Pain Intervention(s): Limited activity within patient's tolerance, Monitored during session, Premedicated before session, Repositioned  Home Living                                          Prior Functioning/Environment              Frequency  Min 1X/week        Progress Toward Goals  OT Goals(current goals can now be found in the care plan section)  Progress towards OT goals: Progressing toward goals;Goals updated  Acute Rehab OT Goals Patient Stated Goal: to get used to new prosthetic and continue with rehab OT Goal Formulation: With patient Time For Goal Achievement: 07/10/23 Potential to Achieve Goals: Good ADL Goals Pt Will Perform Grooming: with modified independence;sitting Pt Will Perform Upper  Body Bathing: with set-up;sitting Pt Will Perform Upper Body Dressing: with modified independence;sitting Pt Will Perform Lower Body Dressing: with min assist;sitting/lateral leans;bed level Pt Will Transfer to Toilet: with mod assist;with +2 assist;anterior/posterior transfer;bedside commode Pt Will Perform Toileting - Clothing Manipulation and hygiene: with supervision;bed level;sitting/lateral leans;with set-up Pt/caregiver will Perform Home Exercise Program: Increased strength;Both right and left upper extremity;Independently;With written HEP provided Additional ADL Goal #1: Pt to don B shrinkers Independently Additional ADL Goal #2: Caregiver to demonstrate safest way to assist pt with ADLs/transfers to decrease fall risk at home  Plan      Co-evaluation    PT/OT/SLP Co-Evaluation/Treatment: Yes Reason for Co-Treatment: For patient/therapist  safety;To address functional/ADL transfers PT goals addressed during session: Mobility/safety with mobility;Balance;Proper use of DME OT goals addressed during session: ADL's and self-care      AM-PAC OT "6 Clicks" Daily Activity     Outcome Measure   Help from another person eating meals?: A Little Help from another person taking care of personal grooming?: A Little Help from another person toileting, which includes using toliet, bedpan, or urinal?: A Little Help from another person bathing (including washing, rinsing, drying)?: A Lot Help from another person to put on and taking off regular upper body clothing?: A Little Help from another person to put on and taking off regular lower body clothing?: A Lot 6 Click Score: 16    End of Session Equipment Utilized During Treatment: Gait belt;Other (comment) (w/c)  OT Visit Diagnosis: Other abnormalities of gait and mobility (R26.89);Other (comment) (decreased activity tolerance)   Activity Tolerance Patient tolerated treatment well;Patient limited by pain   Patient Left in bed;with call bell/phone within reach;with family/visitor present   Nurse Communication Mobility status        Time: 7829-5621 OT Time Calculation (min): 43 min  Charges: OT General Charges $OT Visit: 1 Visit OT Treatments $Self Care/Home Management : 8-22 mins  Genie Mirabal "Orson Eva., OTR/L, MA Acute Rehab [phone]   Lendon Colonel 06/26/2023, 6:29 PM

## 2023-06-26 NOTE — Progress Notes (Signed)
Physical Therapy Treatment Patient Details Name: Alan Tapia. MRN: [id number] DOB: 07-Aug-1975 Today's Date: 06/26/2023   History of Present Illness The pt is a 48 yo male presenting 11/25 with nausea, vomiting, and fever as well as R foot wound. 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    PT Comments  Patient seen with OT and orthotist present for fitting L LE temporary prosthetic and initiating training with OOB to wheelchair transfers.  Patient with difficulty getting up on leg and noting generalized weakness preventing full lift for pivot on leg so scooting transfer to wheelchair then stuck as bed higher than wheelchair so rolled into bed from chair with A for safety.  Patient eager to progress though limited by pain and new weight bearing on the L LE.  Feel he remains appropriate for inpatient rehab (<3 hours/day) to progress with prosthetic training for transfer and for strengthening for improved independence with mobility.    If plan is discharge home, recommend the following: Two people to help with walking and/or transfers;Assist for transportation;Assistance with cooking/housework;Help with stairs or ramp for entrance   Can travel by private vehicle     No  Equipment Recommendations  Wheelchair cushion (measurements PT);Wheelchair (measurements PT);Hospital bed;Other (comment)    Recommendations for Other Services       Precautions / Restrictions Precautions Precautions: Fall Required Braces or Orthoses: Other Brace Other Brace: shrinker's bilateral; orthotist brought temporary leg for L LE     Mobility  Bed Mobility Overal bed mobility: Needs Assistance Bed Mobility: Rolling Rolling: Min assist, Used rails   Supine to sit: Min assist Sit to supine: Contact guard assist   General bed mobility  comments: rolling for hygiene and linen change, assist for safety; up to EOB with A for positioning bed pad under him; to supine pt rolling out of chair into bed due to pain and difficulty scooting or getting prosthetic limb under him to pivot    Transfers Overall transfer level: Needs assistance   Transfers: Bed to chair/wheelchair/BSC            Lateral/Scoot Transfers: Mod assist, +2 safety/equipment General transfer comment: wheelchair on R side and prosthetic on L LE pt placed R leg on seat of chair and used leg some though unable to lift enough to "pivot" so still scooting into chair; back to bed needing A though unable to "pivot" toward prosthetic leg with pain getting it under him so turned to scoot to R though pt still unable to get up to slightly higher sufface of bed so moved R leg onto bed and rolled out of chair into bed with A for safety    Ambulation/Gait                   Psychologist, counselling propulsion: Both upper extremities Wheelchair parts: Needs assistance Distance: just to position chair for transfers with a for placing/removing armrest, etc   Tilt Bed    Modified Rankin (Stroke Patients Only)       Balance Overall balance assessment: Needs assistance   Sitting balance-Leahy Scale: Fair Sitting balance - Comments: on EOB and using UE's for dynamic weight shifting to apply shorts on while seated reaching for rails and leaning on  bed with HOB up                                    Communication Communication Communication: No apparent difficulties  Cognition Arousal: Alert Behavior During Therapy: WFL for tasks assessed/performed   PT - Cognitive impairments: No apparent impairments                         Following commands: Intact      Cueing    Exercises Other Exercises Other Exercises: encouraged lying prone for hip flexor stretch and side leg lift on  R though pt declined due to pain after transfers and prosthetic fitting    General Comments General comments (skin integrity, edema, etc.): significant other in the room and supportive; orthotist, Olivia, from [employer] present for prosthetic fitting of temporary prosthetic on L LE; able to fit and also applied new shrinkers that did not roll down      Pertinent Vitals/Pain Pain Assessment Pain Assessment: Faces Faces Pain Scale: Hurts whole lot Pain Location: L LE with wearing leg Pain Descriptors / Indicators: Discomfort, Grimacing Pain Intervention(s): Monitored during session, Premedicated before session, Repositioned, Limited activity within patient's tolerance    Home Living                          Prior Function            PT Goals (current goals can now be found in the care plan section) Progress towards PT goals: Progressing toward goals    Frequency    Min 1X/week      PT Plan      Co-evaluation PT/OT/SLP Co-Evaluation/Treatment: Yes Reason for Co-Treatment: For patient/therapist safety;To address functional/ADL transfers PT goals addressed during session: Mobility/safety with mobility;Balance;Proper use of DME        AM-PAC PT "6 Clicks" Mobility   Outcome Measure  Help needed turning from your back to your side while in a flat bed without using bedrails?: A Little Help needed moving from lying on your back to sitting on the side of a flat bed without using bedrails?: A Little Help needed moving to and from a bed to a chair (including a wheelchair)?: A Lot Help needed standing up from a chair using your arms (e.g., wheelchair or bedside chair)?: Total Help needed to walk in hospital room?: Total Help needed climbing 3-5 steps with a railing? : Total 6 Click Score: 11    End of Session Equipment Utilized During Treatment: Gait belt;Other (comment) (L LE prosthetic) Activity Tolerance: Patient limited by pain Patient left: in bed;with call  bell/phone within reach;with bed alarm set;with family/visitor present   PT Visit Diagnosis: Other abnormalities of gait and mobility (R26.89);Muscle weakness (generalized) (M62.81);Pain Pain - Right/Left: Left (both) Pain - part of body: Leg     Time: 9147-8295 PT Time Calculation (min) (ACUTE ONLY): 39 min  Charges:    $Therapeutic Activity: 8-22 mins $Prosthetics Training: 8-84mins PT General Charges $$ ACUTE PT VISIT: 1 Visit                     Sheran Lawless, PT Acute Rehabilitation Services Office:[phone] 06/26/2023    Elray Mcgregor 06/26/2023, 5:32 PM

## 2023-06-27 DIAGNOSIS — M86171 Other acute osteomyelitis, right ankle and foot: Secondary | ICD-10-CM | POA: Diagnosis not present

## 2023-06-27 DIAGNOSIS — D649 Anemia, unspecified: Secondary | ICD-10-CM | POA: Diagnosis not present

## 2023-06-27 DIAGNOSIS — Z89511 Acquired absence of right leg below knee: Secondary | ICD-10-CM | POA: Diagnosis not present

## 2023-06-27 NOTE — Progress Notes (Signed)
 PROGRESS NOTE  Alan Tapia.  DOB: 30-Apr-1976  PCP: Patient, No Pcp Per ZOX:[id number]  DOA: 04/06/2023  LOS: 81 days  Hospital Day: 83  Brief narrative: Alan Tapia. is a 48 y.o. male with PMH significant for DM2, HTN, HLD, combined CHF with EF 30%, DCM, peripheral neuropathy, left BKA status. 04/06/2023, patient presented to the ED with complaint of odor from his right foot wound.  Admitted to [facility] for sepsis secondary to right foot osteomyelitis 11/27, underwent right BKA by Dr. Lajoyce Corners.  His hospitalization is prolonged because of unclear disposition plan. Medically stable  Subjective:  Seen and examined.  No new events.  He tried some prosthesis yesterday.  Assessment and plan:  Sepsis POA  right foot osteomyelitis s/p right BKA H/o left BKA Patient was initially treated empirically with antibiotics. Orthopedic surgery performed a right BKA on 11/27. Antibiotics discontinued. PT recommended SNF   Chronic combined systolic and diastolic heart failure Essential hypertension Last echo 1/3 with EF 30% Continue Toprol XL, amlodipine and Entresto Net IO Since Admission: -81,191.49 mL [06/27/23 1257] Continue to monitor for daily intake output, weight, blood pressure, BNP, renal function and electrolytes. No results for input(s): "BNP", "BUN", "CREATININE", "NA", "K", "MG" in the last 168 hours.   Type 2 diabetes mellitus A1c 6.8 in November 2024 PTA meds-not on insulin He was started on SSI Accu-Cheks. fingersticks improved consistently under 200. No longer getting fingerstick monitoring.  Encourage dietary discipline.  Hemorrhoids Treated with Anusol cream.   Community acquired pneumonia Patient completed the course of Zosyn and doxycycline. Resolved.   Acute on chronic anemia Patient with hemoglobin drop down to 5.9 g/dL secondary to right foot wound, requiring 3 units of PRBC on 11/26. Patient requiring another 1 unit of PRBC on 11/30. Hgb now stable  around 9-10's.   COVID-19 infection Patient completed isolation protocol. Patient treated with Paxlovid.   Constipation Holding bowel regimen secondary to worsening diarrhea.   Pressure injury Bilateral perineum, unclear if present on admission.   Fall 2/6, fell out of bed.  No obvious injury.  Chest x-ray without any evidence of rib fracture.    Mobility: Limited due to bilateral BKA status.  Total assist.  Michiel Sites lift.  Goals of care   Code Status: Full Code     DVT prophylaxis: Lovenox subcu    Antimicrobials: None currently Fluid: None Consultants: None Family Communication: None at bedside  Status: Inpatient Level of care:  Med-Surg   Patient is from: Home Anticipated d/c to: Pending SNF.  Long length of stay patient.  Difficulty with disposition.  Case management following.   Diet:  Diet Order             Diet regular Room service appropriate? Yes; Fluid consistency: Thin  Diet effective now           Diet - low sodium heart healthy                   Scheduled Meds:  amLODipine  5 mg Oral Daily   enoxaparin (LOVENOX) injection  60 mg Subcutaneous Daily   feeding supplement  237 mL Oral BID BM   Gerhardt's butt cream   Topical BID   lidocaine  2 patch Transdermal Q24H   melatonin  5 mg Oral QHS   metoprolol succinate  200 mg Oral Daily   pantoprazole  40 mg Oral BID   sacubitril-valsartan  1 tablet Oral BID    PRN meds: acetaminophen, cloNIDine, dextromethorphan-guaiFENesin, diclofenac  Sodium, hydrOXYzine, methocarbamol, ondansetron **OR** ondansetron (ZOFRAN) IV, mouth rinse, oxyCODONE, phenol, senna-docusate, simethicone   Infusions:    Antimicrobials: Anti-infectives (From admission, onward)    Start     Dose/Rate Route Frequency Ordered Stop   04/30/23 1800  nirmatrelvir/ritonavir (PAXLOVID) 3 tablet        3 tablet Oral 2 times daily 04/30/23 1621 05/05/23 0820   04/09/23 1345  doxycycline (VIBRAMYCIN) 100 mg in dextrose 5 % 250 mL  IVPB        100 mg 125 mL/hr over 120 Minutes Intravenous Every 12 hours 04/09/23 1252 04/09/23 1746   04/08/23 1300  levofloxacin (LEVAQUIN) IVPB 500 mg  Status:  Discontinued        500 mg 100 mL/hr over 60 Minutes Intravenous On call to O.R. 04/08/23 1229 04/08/23 1648   04/08/23 1300  vancomycin (VANCOREADY) IVPB 1500 mg/300 mL        1,500 mg 100 mL/hr over 180 Minutes Intravenous On call to O.R. 04/08/23 1229 04/08/23 1433   04/08/23 1237  vancomycin HCl (VANCOREADY) 1500 MG/300ML IVPB       Note to Pharmacy: [name] D: cabinet override      04/08/23 1237 04/08/23 1439   04/07/23 0800  piperacillin-tazobactam (ZOSYN) IVPB 3.375 g        3.375 g 12.5 mL/hr over 240 Minutes Intravenous Every 8 hours 04/07/23 0108 04/09/23 2108   04/07/23 0100  doxycycline (VIBRAMYCIN) 100 mg in dextrose 5 % 250 mL IVPB  Status:  Discontinued        100 mg 125 mL/hr over 120 Minutes Intravenous Every 12 hours 04/07/23 0057 04/09/23 1252   04/07/23 0000  ceFEPIme (MAXIPIME) 2 g in sodium chloride 0.9 % 100 mL IVPB  Status:  Discontinued        2 g 200 mL/hr over 30 Minutes Intravenous Every 8 hours 04/06/23 2349 04/07/23 0108   04/06/23 2330  metroNIDAZOLE (FLAGYL) IVPB 500 mg        500 mg 100 mL/hr over 60 Minutes Intravenous  Once 04/06/23 2321 04/07/23 0032   04/06/23 2300  piperacillin-tazobactam (ZOSYN) IVPB 3.375 g  Status:  Discontinued        3.375 g 100 mL/hr over 30 Minutes Intravenous  Once 04/06/23 2257 04/06/23 2320   04/06/23 2300  vancomycin (VANCOREADY) IVPB 2000 mg/400 mL  Status:  Discontinued        2,000 mg 200 mL/hr over 120 Minutes Intravenous  Once 04/06/23 2257 04/06/23 2312       Objective: Vitals:   06/27/23 0430 06/27/23 0756  BP: (!) 164/82 (!) 165/86  Pulse: 80 80  Resp: 18   Temp: 98.4 F (36.9 C) 98.6 F (37 C)  SpO2: 99% 99%    Intake/Output Summary (Last 24 hours) at 06/27/2023 1257 Last data filed at 06/26/2023 1300 Gross per 24 hour  Intake  240 ml  Output --  Net 240 ml     Filed Weights   06/25/23 0352 06/26/23 0500 06/27/23 0432  Weight: 131.9 kg 131.9 kg 131.9 kg   Weight change: 0 kg Body mass index is 35.4 kg/m.   Physical Exam: General exam: Looks comfortable.  Pleasant to interaction. Skin: No rashes, lesions or ulcers. Extremities: Bilateral BKA status.  Stump clean and dry.  Data Review: I have personally reviewed the laboratory data and studies available.  F/u labs  Unresulted Labs (From admission, onward)    None      Total time spent in review of  labs and imaging, patient evaluation, formulation of plan, documentation and communication with family: 25 minutes  Signed, Dorcas Carrow, MD [facility] 06/27/2023

## 2023-06-28 DIAGNOSIS — Z794 Long term (current) use of insulin: Secondary | ICD-10-CM | POA: Diagnosis not present

## 2023-06-28 DIAGNOSIS — Z89511 Acquired absence of right leg below knee: Secondary | ICD-10-CM | POA: Diagnosis not present

## 2023-06-28 DIAGNOSIS — M86171 Other acute osteomyelitis, right ankle and foot: Secondary | ICD-10-CM | POA: Diagnosis not present

## 2023-06-28 DIAGNOSIS — E119 Type 2 diabetes mellitus without complications: Secondary | ICD-10-CM | POA: Diagnosis not present

## 2023-06-28 LAB — GLUCOSE, CAPILLARY
Glucose-Capillary: 73 mg/dL (ref 70–99)
Glucose-Capillary: 113 mg/dL — ABNORMAL HIGH (ref 70–99)
Glucose-Capillary: 111 mg/dL — ABNORMAL HIGH (ref 70–99)

## 2023-06-28 MED ORDER — OXYCODONE HCL 5 MG PO TABS
10.0000 mg | ORAL_TABLET | ORAL | Status: DC | PRN
  Administered 2023-06-28 – 2023-06-29 (×5): 10 mg via ORAL
  Filled 2023-06-28 (×5): qty 2

## 2023-06-28 MED ORDER — HYDROMORPHONE HCL 1 MG/ML IJ SOLN
1.0000 mg | Freq: Once | INTRAMUSCULAR | Status: DC
  Filled 2023-06-28: qty 1

## 2023-06-28 MED ORDER — POLYETHYLENE GLYCOL 3350 17 G PO PACK
17.0000 g | PACK | Freq: Two times a day (BID) | ORAL | Status: DC
  Administered 2023-06-28 (×2): 17 g via ORAL
  Filled 2023-06-28 (×3): qty 1

## 2023-06-28 MED ORDER — HYDROMORPHONE HCL 1 MG/ML IJ SOLN: 1.0000 mg | Freq: Once | INTRAMUSCULAR | Status: DC

## 2023-06-28 NOTE — TOC Progression Note (Signed)
 Transition of Care (TOC) - Progression Note    Patient Details  Name: Alan Tapia. MRN: [id number] Date of Birth: [birth year]  Transition of Care [facility]) CM/SW Contact  Bartlett Enke, [place], [name] Phone Number: 06/28/2023, 1:43 PM  Clinical Narrative:    Phone call to Revonda Standard to follow up on insurance authorization for SNF placement at [facility]. Insurance authorization remains pending.  Tarin Johndrow, LCSW Transition of Care     Expected Discharge Plan: Skilled Nursing Facility Barriers to Discharge: Insurance Authorization, Continued Medical Work up, SNF Pending bed offer  Expected Discharge Plan and Services In-house Referral: Clinical Social Work     Living arrangements for the past 2 months: Single Family Home Expected Discharge Date: 04/11/23                                     Social Determinants of Health (SDOH) Interventions SDOH Screenings   Food Insecurity: No Food Insecurity (04/07/2023)  Housing: Unknown (04/23/2023)  Recent Concern: Housing - Medium Risk (04/07/2023)  Transportation Needs: No Transportation Needs (04/07/2023)  Recent Concern: Transportation Needs - Unmet Transportation Needs (02/17/2023)   Received from [facility]  Utilities: At Risk (04/07/2023)  Alcohol Screen: Low Risk  (08/20/2022)  Financial Resource Strain: Patient Unable To Answer (08/20/2022)  Tobacco Use: Low Risk  (04/08/2023)    Readmission Risk Interventions    04/07/2023    2:34 PM  Readmission Risk Prevention Plan  Transportation Screening Complete  PCP or Specialist Appt within 3-5 Days Complete  HRI or Home Care Consult Complete  Social Work Consult for Recovery Care Planning/Counseling Complete  Palliative Care Screening Not Applicable  Medication Review Oceanographer) Complete

## 2023-06-28 NOTE — Plan of Care (Signed)
 Problem: Fluid Volume: Goal: Hemodynamic stability will improve Outcome: Progressing   Problem: Clinical Measurements: Goal: Diagnostic test results will improve Outcome: Progressing Goal: Signs and symptoms of infection will decrease Outcome: Progressing   Problem: Respiratory: Goal: Ability to maintain adequate ventilation will improve Outcome: Progressing   Problem: Education: Goal: Knowledge of General Education information will improve Description: Including pain rating scale, medication(s)/side effects and non-pharmacologic comfort measures Outcome: Progressing   Problem: Health Behavior/Discharge Planning: Goal: Ability to manage health-related needs will improve Outcome: Progressing   Problem: Clinical Measurements: Goal: Ability to maintain clinical measurements within normal limits will improve Outcome: Progressing Goal: Will remain free from infection Outcome: Progressing Goal: Diagnostic test results will improve Outcome: Progressing Goal: Respiratory complications will improve Outcome: Progressing Goal: Cardiovascular complication will be avoided Outcome: Progressing   Problem: Activity: Goal: Risk for activity intolerance will decrease Outcome: Progressing   Problem: Nutrition: Goal: Adequate nutrition will be maintained Outcome: Progressing   Problem: Coping: Goal: Level of anxiety will decrease Outcome: Progressing   Problem: Elimination: Goal: Will not experience complications related to bowel motility Outcome: Progressing Goal: Will not experience complications related to urinary retention Outcome: Progressing   Problem: Pain Management: Goal: General experience of comfort will improve Outcome: Progressing   Problem: Safety: Goal: Ability to remain free from injury will improve Outcome: Progressing   Problem: Skin Integrity: Goal: Risk for impaired skin integrity will decrease Outcome: Progressing   Problem: Activity: Goal: Ability  to tolerate increased activity will improve Outcome: Progressing   Problem: Clinical Measurements: Goal: Ability to maintain a body temperature in the normal range will improve Outcome: Progressing   Problem: Respiratory: Goal: Ability to maintain adequate ventilation will improve Outcome: Progressing Goal: Ability to maintain a clear airway will improve Outcome: Progressing   Problem: Education: Goal: Knowledge of the prescribed therapeutic regimen will improve Outcome: Progressing Goal: Ability to verbalize activity precautions or restrictions will improve Outcome: Progressing Goal: Understanding of discharge needs will improve Outcome: Progressing   Problem: Activity: Goal: Ability to perform//tolerate increased activity and mobilize with assistive devices will improve Outcome: Progressing   Problem: Clinical Measurements: Goal: Postoperative complications will be avoided or minimized Outcome: Progressing   Problem: Self-Care: Goal: Ability to meet self-care needs will improve Outcome: Progressing   Problem: Self-Concept: Goal: Ability to maintain and perform role responsibilities to the fullest extent possible will improve Outcome: Progressing   Problem: Education: Goal: Knowledge of General Education information will improve Description: Including pain rating scale, medication(s)/side effects and non-pharmacologic comfort measures Outcome: Progressing   Problem: Health Behavior/Discharge Planning: Goal: Ability to manage health-related needs will improve Outcome: Progressing   Problem: Clinical Measurements: Goal: Ability to maintain clinical measurements within normal limits will improve Outcome: Progressing Goal: Will remain free from infection Outcome: Progressing Goal: Diagnostic test results will improve Outcome: Progressing Goal: Respiratory complications will improve Outcome: Progressing Goal: Cardiovascular complication will be avoided Outcome:  Progressing   Problem: Activity: Goal: Risk for activity intolerance will decrease Outcome: Progressing   Problem: Nutrition: Goal: Adequate nutrition will be maintained Outcome: Progressing   Problem: Coping: Goal: Level of anxiety will decrease Outcome: Progressing   Problem: Elimination: Goal: Will not experience complications related to bowel motility Outcome: Progressing Goal: Will not experience complications related to urinary retention Outcome: Progressing   Problem: Pain Management: Goal: General experience of comfort will improve Outcome: Progressing   Problem: Safety: Goal: Ability to remain free from injury will improve Outcome: Progressing   Problem: Skin Integrity: Goal: Risk for  impaired skin integrity will decrease Outcome: Progressing

## 2023-06-28 NOTE — Progress Notes (Signed)
 PROGRESS NOTE  Alan Tapia.  DOB: 1976/04/28  PCP: Patient, No Pcp Per ZOX:[id number]  DOA: 04/06/2023  LOS: 82 days  Hospital Day: 84  Brief narrative: Alan Chevalier. is a 48 y.o. male with PMH significant for DM2, HTN, HLD, combined CHF with EF 30%, DCM, peripheral neuropathy, left BKA status. 04/06/2023, patient presented to the ED with complaint of odor from his right foot wound.  Admitted to [facility] for sepsis secondary to right foot osteomyelitis 11/27, underwent right BKA by Dr. Lajoyce Corners.  His hospitalization is prolonged because of unclear disposition plan. Medically stable  Subjective:  Patient seen and examined.  Complained of ongoing pain on the right leg and also abdominal pain.  Patient tells me that his pain was relieved with bowel movements. Gave him oxycodone 10 mg and also ordered injectable Dilaudid, however he declined to have IV line.  On subsequent exam patient stated his pain is controlled.  Increase dose of MiraLAX to twice daily.  Assessment and plan:  Sepsis POA  right foot osteomyelitis s/p right BKA H/o left BKA Patient was initially treated empirically with antibiotics. Orthopedic surgery performed a right BKA on 11/27. Antibiotics discontinued. PT recommended SNF   Chronic combined systolic and diastolic heart failure Essential hypertension Last echo 1/3 with EF 30% Continue Toprol XL, amlodipine and Entresto Net IO Since Admission: -18,588.49 mL [06/28/23 1306] Continue to monitor for daily intake output, weight, blood pressure, BNP, renal function and electrolytes. No results for input(s): "BNP", "BUN", "CREATININE", "NA", "K", "MG" in the last 168 hours.   Type 2 diabetes mellitus A1c 6.8 in November 2024 PTA meds-not on insulin He was started on SSI Accu-Cheks. fingersticks improved consistently under 200. No longer getting fingerstick monitoring.  Encourage dietary discipline.  Hemorrhoids Treated with Anusol cream.   Community  acquired pneumonia Patient completed the course of Zosyn and doxycycline. Resolved.   Acute on chronic anemia Patient with hemoglobin drop down to 5.9 g/dL secondary to right foot wound, requiring 3 units of PRBC on 11/26. Patient requiring another 1 unit of PRBC on 11/30. Hgb now stable around 9-10's.   COVID-19 infection Patient completed isolation protocol. Patient treated with Paxlovid.   Constipation Holding bowel regimen secondary to worsening diarrhea.   Pressure injury Bilateral perineum, unclear if present on admission.   Fall 2/6, fell out of bed.  No obvious injury.  Chest x-ray without any evidence of rib fracture.    Mobility: Limited due to bilateral BKA status.  Total assist.  Michiel Sites lift.  Goals of care   Code Status: Full Code     DVT prophylaxis: Lovenox subcu    Antimicrobials: None currently Fluid: None Consultants: None Family Communication: None at bedside  Status: Inpatient Level of care:  Med-Surg   Patient is from: Home Anticipated d/c to: Pending SNF.  Long length of stay patient.  Difficulty with disposition.  Case management following.   Diet:  Diet Order             Diet regular Room service appropriate? Yes; Fluid consistency: Thin  Diet effective now           Diet - low sodium heart healthy                   Scheduled Meds:  amLODipine  5 mg Oral Daily   enoxaparin (LOVENOX) injection  60 mg Subcutaneous Daily   feeding supplement  237 mL Oral BID BM   Gerhardt's butt cream   Topical  BID    HYDROmorphone (DILAUDID) injection  1 mg Intramuscular Once   lidocaine  2 patch Transdermal Q24H   melatonin  5 mg Oral QHS   metoprolol succinate  200 mg Oral Daily   pantoprazole  40 mg Oral BID   polyethylene glycol  17 g Oral BID   sacubitril-valsartan  1 tablet Oral BID    PRN meds: acetaminophen, cloNIDine, dextromethorphan-guaiFENesin, diclofenac Sodium, hydrOXYzine, methocarbamol, ondansetron **OR** ondansetron (ZOFRAN)  IV, mouth rinse, oxyCODONE, phenol, senna-docusate, simethicone   Infusions:    Antimicrobials: Anti-infectives (From admission, onward)    Start     Dose/Rate Route Frequency Ordered Stop   04/30/23 1800  nirmatrelvir/ritonavir (PAXLOVID) 3 tablet        3 tablet Oral 2 times daily 04/30/23 1621 05/05/23 0820   04/09/23 1345  doxycycline (VIBRAMYCIN) 100 mg in dextrose 5 % 250 mL IVPB        100 mg 125 mL/hr over 120 Minutes Intravenous Every 12 hours 04/09/23 1252 04/09/23 1746   04/08/23 1300  levofloxacin (LEVAQUIN) IVPB 500 mg  Status:  Discontinued        500 mg 100 mL/hr over 60 Minutes Intravenous On call to O.R. 04/08/23 1229 04/08/23 1648   04/08/23 1300  vancomycin (VANCOREADY) IVPB 1500 mg/300 mL        1,500 mg 100 mL/hr over 180 Minutes Intravenous On call to O.R. 04/08/23 1229 04/08/23 1433   04/08/23 1237  vancomycin HCl (VANCOREADY) 1500 MG/300ML IVPB       Note to Pharmacy: Alan Tapia D: cabinet override      04/08/23 1237 04/08/23 1439   04/07/23 0800  piperacillin-tazobactam (ZOSYN) IVPB 3.375 g        3.375 g 12.5 mL/hr over 240 Minutes Intravenous Every 8 hours 04/07/23 0108 04/09/23 2108   04/07/23 0100  doxycycline (VIBRAMYCIN) 100 mg in dextrose 5 % 250 mL IVPB  Status:  Discontinued        100 mg 125 mL/hr over 120 Minutes Intravenous Every 12 hours 04/07/23 0057 04/09/23 1252   04/07/23 0000  ceFEPIme (MAXIPIME) 2 g in sodium chloride 0.9 % 100 mL IVPB  Status:  Discontinued        2 g 200 mL/hr over 30 Minutes Intravenous Every 8 hours 04/06/23 2349 04/07/23 0108   04/06/23 2330  metroNIDAZOLE (FLAGYL) IVPB 500 mg        500 mg 100 mL/hr over 60 Minutes Intravenous  Once 04/06/23 2321 04/07/23 0032   04/06/23 2300  piperacillin-tazobactam (ZOSYN) IVPB 3.375 g  Status:  Discontinued        3.375 g 100 mL/hr over 30 Minutes Intravenous  Once 04/06/23 2257 04/06/23 2320   04/06/23 2300  vancomycin (VANCOREADY) IVPB 2000 mg/400 mL  Status:   Discontinued        2,000 mg 200 mL/hr over 120 Minutes Intravenous  Once 04/06/23 2257 04/06/23 2312       Objective: Vitals:   06/28/23 0429 06/28/23 0959  BP: (!) 153/91 (!) 150/76  Pulse: 83 92  Resp: 18 19  Temp: 98.3 F (36.8 C) 98 F (36.7 C)  SpO2: 99% 99%    Intake/Output Summary (Last 24 hours) at 06/28/2023 1306 Last data filed at 06/28/2023 0600 Gross per 24 hour  Intake 480 ml  Output 500 ml  Net -20 ml     Filed Weights   06/26/23 0500 06/27/23 0432 06/28/23 0429  Weight: 131.9 kg 131.9 kg 128.2 kg   Weight change: -  3.7 kg Body mass index is 34.4 kg/m.   Physical Exam: General exam: Looks comfortable.  Pleasant to interaction. Skin: No rashes, lesions or ulcers. Extremities: Bilateral BKA status.  Stump clean and dry.  Data Review: I have personally reviewed the laboratory data and studies available.  F/u labs  Unresulted Labs (From admission, onward)    None      Total time spent in review of labs and imaging, patient evaluation, formulation of plan, documentation and communication with family: 25 minutes  Signed, Dorcas Carrow, MD [facility] 06/28/2023

## 2023-06-29 DIAGNOSIS — D649 Anemia, unspecified: Secondary | ICD-10-CM | POA: Diagnosis not present

## 2023-06-29 DIAGNOSIS — Z89511 Acquired absence of right leg below knee: Secondary | ICD-10-CM | POA: Diagnosis not present

## 2023-06-29 DIAGNOSIS — M86171 Other acute osteomyelitis, right ankle and foot: Secondary | ICD-10-CM | POA: Diagnosis not present

## 2023-06-29 LAB — GLUCOSE, CAPILLARY
Glucose-Capillary: 101 mg/dL — ABNORMAL HIGH (ref 70–99)
Glucose-Capillary: 96 mg/dL (ref 70–99)

## 2023-06-29 MED ORDER — POLYETHYLENE GLYCOL 3350 17 G PO PACK
17.0000 g | PACK | Freq: Two times a day (BID) | ORAL | Status: DC
Start: 2023-06-29 — End: 2023-12-08

## 2023-06-29 MED ORDER — SACUBITRIL-VALSARTAN 97-103 MG PO TABS: 1.0000 | ORAL_TABLET | Freq: Two times a day (BID) | ORAL | Status: DC

## 2023-06-29 MED ORDER — OXYCODONE HCL 10 MG PO TABS
5.0000 mg | ORAL_TABLET | Freq: Four times a day (QID) | ORAL | 0 refills | Status: AC | PRN
Start: 2023-06-29 — End: 2023-07-05

## 2023-06-29 MED ORDER — METOPROLOL SUCCINATE ER 200 MG PO TB24: 200.0000 mg | ORAL_TABLET | Freq: Every day | ORAL | 0 refills | Status: DC

## 2023-06-29 MED ORDER — PANTOPRAZOLE SODIUM 40 MG PO TBEC: 40.0000 mg | DELAYED_RELEASE_TABLET | Freq: Two times a day (BID) | ORAL | Status: DC

## 2023-06-29 MED ORDER — SIMETHICONE 80 MG PO CHEW: 80.0000 mg | CHEWABLE_TABLET | Freq: Four times a day (QID) | ORAL | Status: DC | PRN

## 2023-06-29 MED ORDER — LIDOCAINE 5 % EX PTCH: 2.0000 | MEDICATED_PATCH | CUTANEOUS | Status: DC

## 2023-06-29 MED ORDER — PHENYLEPHRINE HCL-NACL 20-0.9 MG/250ML-% IV SOLN
INTRAVENOUS | Status: AC
  Filled 2023-06-29: qty 500

## 2023-06-29 MED ORDER — AMLODIPINE BESYLATE 5 MG PO TABS
5.0000 mg | ORAL_TABLET | Freq: Every day | ORAL | Status: DC
Start: 2023-06-30 — End: 2023-12-08

## 2023-06-29 MED ORDER — PHENYLEPHRINE HCL-NACL 20-0.9 MG/250ML-% IV SOLN
INTRAVENOUS | Status: AC
  Filled 2023-06-29: qty 250

## 2023-06-29 MED ORDER — DICLOFENAC SODIUM 1 % EX GEL: 2.0000 g | Freq: Four times a day (QID) | CUTANEOUS | Status: DC | PRN

## 2023-06-29 MED ORDER — HYDROXYZINE HCL 25 MG PO TABS: 25.0000 mg | ORAL_TABLET | Freq: Three times a day (TID) | ORAL | Status: DC | PRN

## 2023-06-29 MED ORDER — ACETAMINOPHEN 500 MG PO TABS: 1000.0000 mg | ORAL_TABLET | Freq: Three times a day (TID) | ORAL | 0 refills | Status: DC | PRN

## 2023-06-29 MED ORDER — PROPOFOL 1000 MG/100ML IV EMUL
INTRAVENOUS | Status: AC
  Filled 2023-06-29: qty 200

## 2023-06-29 MED ORDER — METHOCARBAMOL 500 MG PO TABS: 500.0000 mg | ORAL_TABLET | ORAL | 0 refills | Status: AC | PRN

## 2023-06-29 NOTE — Discharge Summary (Signed)
 Physician Discharge Summary  Alan Tapia. XBJ:[id number] DOB: Feb 04, 1976 DOA: 04/06/2023  PCP: Patient, No Pcp Per  Admit date: 04/06/2023 Discharge date: 06/29/2023  Admitted From: Home Disposition: Skilled nursing facility  Recommendations for Outpatient Follow-up:  Continue to work with mobility.  Schedule follow-up with cardiology upon discharge.  Discharge Condition: Stable CODE STATUS: Full code Diet recommendation: Low-salt diet  Discharge summary: Admitted with severe medical issues.  Underwent multiple procedures as below.  Prolonged hospitalization due to bilateral amputation, unable to walk.  Medically stable.  Going to rehab today.  Alan Tapia. is a 48 y.o. male with PMH significant for DM2, HTN, HLD, combined CHF with EF 30%, DCM, peripheral neuropathy, left BKA status. 04/06/2023, patient presented to the ED with complaint of odor from his right foot wound.  Admitted to [facility] for sepsis secondary to right foot osteomyelitis 11/27, underwent right BKA by Dr. Lajoyce Corners.   His hospitalization is prolonged because of unclear disposition plan. Medically stable and now able to discharge.  Treated for following conditions.   Assessment and plan:  Sepsis POA  right foot osteomyelitis s/p right BKA H/o left BKA Patient was initially treated empirically with antibiotics. Orthopedic surgery performed a right BKA on 11/27.  Completed antibiotics.  Working on prosthetics.  Will need ongoing rehab.   Chronic combined systolic and diastolic heart failure Essential hypertension Last echo 1/3 with EF 30% Continue Toprol XL, amlodipine and Entresto Currently euvolemic.  Will need frequent monitoring by renal function test.   Type 2 diabetes mellitus A1c 6.8 in November 2024 PTA meds-not using insulin. In the hospital, he has not needed any insulin.  His blood sugars are stable.   Hemorrhoids Treated with Anusol cream.   Community acquired pneumonia Patient completed  the course of Zosyn and doxycycline. Resolved.   Acute on chronic anemia Patient with hemoglobin drop down to 5.9 g/dL secondary to right foot wound, requiring 3 units of PRBC on 11/26. Patient requiring another 1 unit of PRBC on 11/30. Hgb now stable around 9-10's.  Last hemoglobin 10.5 on 2/7.   COVID-19 infection Patient completed isolation protocol. Patient treated with Paxlovid.   Constipation Significant constipation after discontinuation of laxatives.  Will keep on MiraLAX twice daily.  Medically stable to discharge to a skilled nursing level of care. Prescribed oxycodone 5 mg every 6 hours as needed for ongoing pain control.  Discharge Diagnoses:  Principal Problem:   Sepsis (HCC) Active Problems:   Essential hypertension   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   CKD (chronic kidney disease), stage II   S/P BKA (below knee amputation), right (HCC) - 04-08-2023   Acute on chronic anemia   Chronic pain   Acute osteomyelitis of right foot (HCC)   Insulin dependent type 2 diabetes mellitus (HCC)   Hx of left BKA (HCC)   PVD (peripheral vascular disease) (HCC)   Subacute osteomyelitis of right foot (HCC)   Cutaneous abscess of right foot   Malnutrition of moderate degree   COVID-19 virus infection   Pressure injury of skin   Obesity, Class II, BMI 35-39.9    Discharge Instructions  Discharge Instructions     Call MD for:  redness, tenderness, or signs of infection (pain, swelling, redness, odor or green/yellow discharge around incision site)   Complete by: As directed    Call MD for:  severe uncontrolled pain   Complete by: As directed    Call MD for:  temperature >100.4   Complete by: As directed    Diet - low sodium heart healthy   Complete by: As directed    Discharge wound care:   Complete by: As directed    Stump to keep dry and clean , wear stump shrinker   Increase activity slowly   Complete by: As  directed    Increase activity slowly   Complete by: As directed       Allergies as of 06/29/2023       Reactions   Fish Allergy Anaphylaxis, Hives, Other (See Comments)   Benadryl [diphenhydramine] Swelling   Pt reports cannot take pill form of benadryl   Cefepime Other (See Comments)   Redness of the neck and cheek.  Concern for rash   Coreg [carvedilol] Nausea Only   Fish-derived Products    Vancomycin Hives   Redman's-type rash on 08/13/22        Medication List     STOP taking these medications    furosemide 40 MG tablet Commonly known as: LASIX   gabapentin 400 MG capsule Commonly known as: NEURONTIN   hydrocerin Crea   Lantus SoloStar 100 UNIT/ML Solostar Pen Generic drug: insulin glargine   metoprolol tartrate 100 MG tablet Commonly known as: LOPRESSOR   multivitamin with minerals Tabs tablet   NovoLOG FlexPen 100 UNIT/ML FlexPen Generic drug: insulin aspart   polyethylene glycol powder 17 GM/SCOOP powder Commonly known as: GLYCOLAX/MIRALAX Replaced by: polyethylene glycol 17 g packet   spironolactone 50 MG tablet Commonly known as: ALDACTONE   TechLite Pen Needles 32G X 4 MM Misc Generic drug: Insulin Pen Needle   traZODone 50 MG tablet Commonly known as: DESYREL   vitamin C 1000 MG tablet   zinc oxide 20 % ointment       TAKE these medications    acetaminophen 500 MG tablet Commonly known as: TYLENOL Take 2 tablets (1,000 mg total) by mouth every 8 (eight) hours as needed for mild pain (pain score 1-3), fever or headache (or temp > 100.5). What changed:  medication strength how much to take when to take this reasons to take this   amLODipine 5 MG tablet Commonly known as: NORVASC Take 1 tablet (5 mg total) by mouth daily. Start taking on: June 30, 2023   diclofenac Sodium 1 % Gel Commonly known as: VOLTAREN Apply 2 g topically 4 (four) times daily as needed (areas of pain.).   hydrOXYzine 25 MG tablet Commonly known as:  ATARAX Take 1 tablet (25 mg total) by mouth 3 (three) times daily as needed for itching or anxiety.   lidocaine 5 % Commonly known as: LIDODERM Place 2 patches onto the skin daily. Remove & Discard patch within 12 hours or as directed by MD   melatonin 5 MG Tabs Take 1 tablet (5 mg total) by mouth at bedtime.   methocarbamol 500 MG tablet Commonly known as: ROBAXIN Take 1 tablet (500 mg total) by mouth every 4 (four) hours as needed for up to 5 days for muscle spasms. What changed:  medication strength how much to take when to take this   metoprolol 200 MG 24 hr tablet Commonly known as: TOPROL-XL Take 1 tablet (200 mg total) by mouth daily. Take with or immediately following a meal. Start taking on: June 30, 2023   ondansetron 4 MG tablet Commonly known as: Zofran Take 1 tablet (4 mg total) by mouth every 8 (eight) hours as needed for nausea or vomiting.   Oxycodone HCl  10 MG Tabs Take 0.5 tablets (5 mg total) by mouth every 6 (six) hours as needed for up to 6 days. What changed:  medication strength how much to take when to take this reasons to take this   pantoprazole 40 MG tablet Commonly known as: PROTONIX Take 1 tablet (40 mg total) by mouth 2 (two) times daily.   polyethylene glycol 17 g packet Commonly known as: MIRALAX / GLYCOLAX Take 17 g by mouth 2 (two) times daily. Replaces: polyethylene glycol powder 17 GM/SCOOP powder   sacubitril-valsartan 97-103 MG Commonly known as: ENTRESTO Take 1 tablet by mouth 2 (two) times daily.   simethicone 80 MG chewable tablet Commonly known as: MYLICON Chew 1 tablet (80 mg total) by mouth 4 (four) times daily as needed for flatulence.               Durable Medical Equipment  (From admission, onward)           Start     Ordered   06/10/23 1220  For home use only DME Other see comment  Once       Comments: [name] wife [phone] call for delivery   [address] , [place], [name]  [place]  lift  Question:  Length of Need  Answer:  Lifetime   06/10/23 1219              Discharge Care Instructions  (From admission, onward)           Start     Ordered   06/29/23 0000  Discharge wound care:       Comments: Stump to keep dry and clean , wear stump shrinker   06/29/23 1049            Follow-up Information     Nadara Mustard, MD Follow up in 1 week(s).   Specialty: Orthopedic Surgery Contact information: [address] [name] [address] [phone]         [facility] Follow up.   Contact information: [address] [name] [address] [phone]         Medicaid Transportation Follow up.   Contact information: phone [phone]               Allergies  Allergen Reactions   Fish Allergy Anaphylaxis, Hives and Other (See Comments)   Benadryl [Diphenhydramine] Swelling    Pt reports cannot take pill form of benadryl   Cefepime Other (See Comments)    Redness of the neck and cheek.  Concern for rash   Coreg [Carvedilol] Nausea Only   Fish-Derived Products    Vancomycin Hives    Redman's-type rash on 08/13/22    Consultations: Multiple   Procedures/Studies: DG Chest Port 1 View Result Date: 06/19/2023 CLINICAL DATA:  Trauma. EXAM: PORTABLE CHEST 1 VIEW COMPARISON:  May 10, 2023. FINDINGS: Stable cardiomediastinal silhouette. Mild to moderate loculated left pleural effusion is noted laterally. Left perihilar opacity is noted concerning for pneumonia or atelectasis. Right lung is clear. Bony thorax is unremarkable. IMPRESSION: Stable probable loculated left pleural effusion with adjacent left lung atelectasis or infiltrate. Electronically Signed   By: Lupita Raider M.D.   On: 06/19/2023 11:49   DG Abd Portable 1V Result Date: 06/12/2023 CLINICAL DATA:  [id number] Abdominal pain [id number] EXAM: PORTABLE ABDOMEN - 1 VIEW COMPARISON:  05/26/2023 FINDINGS: Small bowel loop within the central abdomen measuring upper  limits of normal in caliber (3.0 cm). No additional  abnormally distended loops of bowel. Moderate volume stool within the colon. No gross free intraperitoneal air on supine imaging. IMPRESSION: 1. Small bowel loop within the central abdomen measuring upper limits of normal in caliber. Findings may reflect ileus or enteritis although a early/partial small bowel obstruction is not excluded. 2. Moderate volume stool within the colon. Electronically Signed   By: Duanne Guess D.O.   On: 06/12/2023 14:44   (Echo, Carotid, EGD, Colonoscopy, ERCP)    Subjective: Patient seen in the morning rounds.  Denies any complaints.  Pain is controlled.  Denies any nausea vomiting.  Abdominal pain has improved after 2 bowel movements yesterday.  Very happy to know he is going to get out of the hospital.   Discharge Exam: Vitals:   06/29/23 0301 06/29/23 0731  BP: (!) 157/89 (!) 146/85  Pulse: 91 88  Resp: 14 16  Temp: 98.2 F (36.8 C) 98 F (36.7 C)  SpO2: 100% 100%   Vitals:   06/28/23 1659 06/28/23 2100 06/29/23 0301 06/29/23 0731  BP: (!) 153/86 (!) 171/85 (!) 157/89 (!) 146/85  Pulse: 87 81 91 88  Resp: 17 14 14 16   Temp:  98.2 F (36.8 C) 98.2 F (36.8 C) 98 F (36.7 C)  TempSrc:  Oral Oral Oral  SpO2: 100% 100% 100% 100%  Weight:      Height:        General: Pt is alert, awake, not in acute distress Cardiovascular: RRR, S1/S2 +, no rubs, no gallops Respiratory: CTA bilaterally, no wheezing, no rhonchi Abdominal: Soft, NT, ND, bowel sounds +, obese and pendulous.  Nontender. Extremities: no edema, no cyanosis Both legs with BKA stump clean and dry.    The results of significant diagnostics from this hospitalization (including imaging, microbiology, ancillary and laboratory) are listed below for reference.     Microbiology: No results found for this or any previous visit (from the past 240 hours).   Labs: BNP (last 3 results) Recent Labs    12/14/22 0839 05/23/23 0449  BNP  197.3* 1,520.2*   Basic Metabolic Panel: No results for input(s): "NA", "K", "CL", "CO2", "GLUCOSE", "BUN", "CREATININE", "CALCIUM", "MG", "PHOS" in the last 168 hours. Liver Function Tests: No results for input(s): "AST", "ALT", "ALKPHOS", "BILITOT", "PROT", "ALBUMIN" in the last 168 hours. No results for input(s): "LIPASE", "AMYLASE" in the last 168 hours. No results for input(s): "AMMONIA" in the last 168 hours. CBC: No results for input(s): "WBC", "NEUTROABS", "HGB", "HCT", "MCV", "PLT" in the last 168 hours. Cardiac Enzymes: No results for input(s): "CKTOTAL", "CKMB", "CKMBINDEX", "TROPONINI" in the last 168 hours. BNP: Invalid input(s): "POCBNP" CBG: Recent Labs  Lab 06/26/23 0747 06/28/23 1030 06/28/23 1211 06/28/23 1659 06/29/23 0821  GLUCAP 101* 73 113* 111* 101*   D-Dimer No results for input(s): "DDIMER" in the last 72 hours. Hgb A1c No results for input(s): "HGBA1C" in the last 72 hours. Lipid Profile No results for input(s): "CHOL", "HDL", "LDLCALC", "TRIG", "CHOLHDL", "LDLDIRECT" in the last 72 hours. Thyroid function studies No results for input(s): "TSH", "T4TOTAL", "T3FREE", "THYROIDAB" in the last 72 hours.  Invalid input(s): "FREET3" Anemia work up No results for input(s): "VITAMINB12", "FOLATE", "FERRITIN", "TIBC", "IRON", "RETICCTPCT" in the last 72 hours. Urinalysis    Component Value Date/Time   COLORURINE AMBER (A) 04/07/2023 1143   APPEARANCEUR CLOUDY (A) 04/07/2023 1143   LABSPEC 1.024 04/07/2023 1143   PHURINE 5.0 04/07/2023 1143   GLUCOSEU 150 (A) 04/07/2023 1143   HGBUR NEGATIVE 04/07/2023 1143  BILIRUBINUR NEGATIVE 04/07/2023 1143   KETONESUR NEGATIVE 04/07/2023 1143   PROTEINUR >=300 (A) 04/07/2023 1143   NITRITE NEGATIVE 04/07/2023 1143   LEUKOCYTESUR NEGATIVE 04/07/2023 1143   Sepsis Labs No results for input(s): "WBC" in the last 168 hours.  Invalid input(s): "PROCALCITONIN", "LACTICIDVEN" Microbiology No results found for  this or any previous visit (from the past 240 hours).   Time coordinating discharge: 35 minutes  SIGNED:   Dorcas Carrow, MD  [facility] 06/29/2023, 10:49 AM

## 2023-06-29 NOTE — Plan of Care (Signed)
 Problem: Fluid Volume: Goal: Hemodynamic stability will improve 06/29/2023 1207 by [place], RN Outcome: Adequate for Discharge 06/29/2023 1206 by [place], RN Outcome: Progressing   Problem: Clinical Measurements: Goal: Diagnostic test results will improve 06/29/2023 1207 by [place], RN Outcome: Adequate for Discharge 06/29/2023 1206 by [place], RN Outcome: Progressing Goal: Signs and symptoms of infection will decrease 06/29/2023 1207 by [place], RN Outcome: Adequate for Discharge 06/29/2023 1206 by [place], RN Outcome: Progressing   Problem: Respiratory: Goal: Ability to maintain adequate ventilation will improve 06/29/2023 1207 by [place], RN Outcome: Adequate for Discharge 06/29/2023 1206 by [place], RN Outcome: Progressing   Problem: Education: Goal: Knowledge of General Education information will improve Description: Including pain rating scale, medication(s)/side effects and non-pharmacologic comfort measures 06/29/2023 1207 by [place], RN Outcome: Adequate for Discharge 06/29/2023 1206 by [place], RN Outcome: Progressing   Problem: Health Behavior/Discharge Planning: Goal: Ability to manage health-related needs will improve 06/29/2023 1207 by [place], RN Outcome: Adequate for Discharge 06/29/2023 1206 by [place], RN Outcome: Progressing   Problem: Clinical Measurements: Goal: Ability to maintain clinical measurements within normal limits will improve 06/29/2023 1207 by [place], RN Outcome: Adequate for Discharge 06/29/2023 1206 by [place], RN Outcome: Progressing Goal: Will remain free from infection 06/29/2023 1207 by [place], RN Outcome: Adequate for Discharge 06/29/2023 1206 by [place], RN Outcome: Progressing Goal: Diagnostic test results will improve 06/29/2023 1207 by [place],  RN Outcome: Adequate for Discharge 06/29/2023 1206 by [place], RN Outcome: Progressing Goal: Respiratory complications will improve 06/29/2023 1207 by [place], RN Outcome: Adequate for Discharge 06/29/2023 1206 by [place], RN Outcome: Progressing Goal: Cardiovascular complication will be avoided 06/29/2023 1207 by [place], RN Outcome: Adequate for Discharge 06/29/2023 1206 by [place], RN Outcome: Progressing   Problem: Activity: Goal: Risk for activity intolerance will decrease 06/29/2023 1207 by [place], RN Outcome: Adequate for Discharge 06/29/2023 1206 by [place], RN Outcome: Progressing   Problem: Nutrition: Goal: Adequate nutrition will be maintained 06/29/2023 1207 by [place], RN Outcome: Adequate for Discharge 06/29/2023 1206 by [place], RN Outcome: Progressing   Problem: Coping: Goal: Level of anxiety will decrease 06/29/2023 1207 by [place], RN Outcome: Adequate for Discharge 06/29/2023 1206 by [place], RN Outcome: Progressing   Problem: Elimination: Goal: Will not experience complications related to bowel motility 06/29/2023 1207 by [place], RN Outcome: Adequate for Discharge 06/29/2023 1206 by [place], RN Outcome: Progressing Goal: Will not experience complications related to urinary retention 06/29/2023 1207 by [place], RN Outcome: Adequate for Discharge 06/29/2023 1206 by [place], RN Outcome: Progressing   Problem: Pain Management: Goal: General experience of comfort will improve 06/29/2023 1207 by [place], RN Outcome: Adequate for Discharge 06/29/2023 1206 by [place], RN Outcome: Progressing   Problem: Safety: Goal: Ability to remain free from injury will improve 06/29/2023 1207 by [place], RN Outcome: Adequate for Discharge 06/29/2023 1206 by [place],  RN Outcome: Progressing   Problem: Skin Integrity: Goal: Risk for impaired skin integrity will decrease 06/29/2023 1207 by [place], RN Outcome: Adequate for Discharge 06/29/2023 1206 by [place], RN Outcome: Progressing   Problem: Activity: Goal: Ability to tolerate increased activity will improve  06/29/2023 1207 by [place], RN Outcome: Adequate for Discharge 06/29/2023 1206 by [place], RN Outcome: Progressing   Problem: Clinical Measurements: Goal: Ability to maintain a body temperature in the normal range will improve 06/29/2023 1207 by [place], RN Outcome: Adequate for Discharge 06/29/2023 1206 by [place], RN Outcome: Progressing   Problem: Respiratory: Goal: Ability to maintain adequate ventilation will improve 06/29/2023 1207 by [place], RN Outcome: Adequate for Discharge 06/29/2023 1206 by [place], RN Outcome: Progressing Goal: Ability to maintain a clear airway will improve 06/29/2023 1207 by [place], RN Outcome: Adequate for Discharge 06/29/2023 1206 by [place], RN Outcome: Progressing   Problem: Education: Goal: Knowledge of the prescribed therapeutic regimen will improve 06/29/2023 1207 by [place], RN Outcome: Adequate for Discharge 06/29/2023 1206 by [place], RN Outcome: Progressing Goal: Ability to verbalize activity precautions or restrictions will improve 06/29/2023 1207 by [place], RN Outcome: Adequate for Discharge 06/29/2023 1206 by [place], RN Outcome: Progressing Goal: Understanding of discharge needs will improve 06/29/2023 1207 by [place], RN Outcome: Adequate for Discharge 06/29/2023 1206 by [place], RN Outcome: Progressing   Problem: Activity: Goal: Ability to perform//tolerate increased activity and mobilize with assistive devices will improve 06/29/2023 1207 by [place],  RN Outcome: Adequate for Discharge 06/29/2023 1206 by [place], RN Outcome: Progressing   Problem: Clinical Measurements: Goal: Postoperative complications will be avoided or minimized 06/29/2023 1207 by [place], RN Outcome: Adequate for Discharge 06/29/2023 1206 by [place], RN Outcome: Progressing   Problem: Self-Care: Goal: Ability to meet self-care needs will improve 06/29/2023 1207 by [place], RN Outcome: Adequate for Discharge 06/29/2023 1206 by [place], RN Outcome: Progressing   Problem: Self-Concept: Goal: Ability to maintain and perform role responsibilities to the fullest extent possible will improve 06/29/2023 1207 by [place], RN Outcome: Adequate for Discharge 06/29/2023 1206 by [place], RN Outcome: Progressing   Problem: Education: Goal: Knowledge of General Education information will improve Description: Including pain rating scale, medication(s)/side effects and non-pharmacologic comfort measures 06/29/2023 1207 by [place], RN Outcome: Adequate for Discharge 06/29/2023 1206 by [place], RN Outcome: Progressing   Problem: Health Behavior/Discharge Planning: Goal: Ability to manage health-related needs will improve 06/29/2023 1207 by [place], RN Outcome: Adequate for Discharge 06/29/2023 1206 by [place], RN Outcome: Progressing   Problem: Clinical Measurements: Goal: Ability to maintain clinical measurements within normal limits will improve 06/29/2023 1207 by [place], RN Outcome: Adequate for Discharge 06/29/2023 1206 by [place], RN Outcome: Progressing Goal: Will remain free from infection 06/29/2023 1207 by [place], RN Outcome: Adequate for Discharge 06/29/2023 1206 by [place], RN Outcome: Progressing Goal: Diagnostic test results will improve 06/29/2023 1207 by [place], RN Outcome: Adequate  for Discharge 06/29/2023 1206 by [place], RN Outcome: Progressing Goal: Respiratory complications will improve 06/29/2023 1207 by [place], RN Outcome: Adequate for Discharge 06/29/2023 1206 by [place], RN Outcome: Progressing Goal: Cardiovascular complication will be avoided 06/29/2023 1207 by [place], RN Outcome: Adequate for Discharge 06/29/2023 1206 by [place], RN Outcome: Progressing   Problem: Activity: Goal: Risk for activity intolerance will decrease 06/29/2023 1207 by [place], RN Outcome: Adequate for Discharge 06/29/2023 1206 by [place], RN Outcome: Progressing  Problem: Nutrition: Goal: Adequate nutrition will be maintained 06/29/2023 1207 by [place], RN Outcome: Adequate for Discharge 06/29/2023 1206 by [place], RN Outcome: Progressing   Problem: Coping: Goal: Level of anxiety will decrease 06/29/2023 1207 by [place], RN Outcome: Adequate for Discharge 06/29/2023 1206 by [place], RN Outcome: Progressing   Problem: Elimination: Goal: Will not experience complications related to bowel motility 06/29/2023 1207 by [place], RN Outcome: Adequate for Discharge 06/29/2023 1206 by [place], RN Outcome: Progressing Goal: Will not experience complications related to urinary retention 06/29/2023 1207 by [place], RN Outcome: Adequate for Discharge 06/29/2023 1206 by [place], RN Outcome: Progressing   Problem: Pain Management: Goal: General experience of comfort will improve 06/29/2023 1207 by [place], RN Outcome: Adequate for Discharge 06/29/2023 1206 by [place], RN Outcome: Progressing   Problem: Safety: Goal: Ability to remain free from injury will improve 06/29/2023 1207 by [place], RN Outcome: Adequate for Discharge 06/29/2023 1206 by [place], RN Outcome: Progressing    Problem: Skin Integrity: Goal: Risk for impaired skin integrity will decrease 06/29/2023 1207 by [place], RN Outcome: Adequate for Discharge 06/29/2023 1206 by [place], RN Outcome: Progressing

## 2023-06-29 NOTE — Plan of Care (Signed)
 Problem: Fluid Volume: Goal: Hemodynamic stability will improve Outcome: Progressing   Problem: Clinical Measurements: Goal: Diagnostic test results will improve Outcome: Progressing Goal: Signs and symptoms of infection will decrease Outcome: Progressing   Problem: Respiratory: Goal: Ability to maintain adequate ventilation will improve Outcome: Progressing   Problem: Education: Goal: Knowledge of General Education information will improve Description: Including pain rating scale, medication(s)/side effects and non-pharmacologic comfort measures Outcome: Progressing   Problem: Health Behavior/Discharge Planning: Goal: Ability to manage health-related needs will improve Outcome: Progressing   Problem: Clinical Measurements: Goal: Ability to maintain clinical measurements within normal limits will improve Outcome: Progressing Goal: Will remain free from infection Outcome: Progressing Goal: Diagnostic test results will improve Outcome: Progressing Goal: Respiratory complications will improve Outcome: Progressing Goal: Cardiovascular complication will be avoided Outcome: Progressing   Problem: Activity: Goal: Risk for activity intolerance will decrease Outcome: Progressing   Problem: Nutrition: Goal: Adequate nutrition will be maintained Outcome: Progressing   Problem: Coping: Goal: Level of anxiety will decrease Outcome: Progressing   Problem: Elimination: Goal: Will not experience complications related to bowel motility Outcome: Progressing Goal: Will not experience complications related to urinary retention Outcome: Progressing   Problem: Pain Management: Goal: General experience of comfort will improve Outcome: Progressing   Problem: Safety: Goal: Ability to remain free from injury will improve Outcome: Progressing   Problem: Skin Integrity: Goal: Risk for impaired skin integrity will decrease Outcome: Progressing   Problem: Activity: Goal: Ability  to tolerate increased activity will improve Outcome: Progressing   Problem: Clinical Measurements: Goal: Ability to maintain a body temperature in the normal range will improve Outcome: Progressing   Problem: Respiratory: Goal: Ability to maintain adequate ventilation will improve Outcome: Progressing Goal: Ability to maintain a clear airway will improve Outcome: Progressing   Problem: Education: Goal: Knowledge of the prescribed therapeutic regimen will improve Outcome: Progressing Goal: Ability to verbalize activity precautions or restrictions will improve Outcome: Progressing Goal: Understanding of discharge needs will improve Outcome: Progressing   Problem: Activity: Goal: Ability to perform//tolerate increased activity and mobilize with assistive devices will improve Outcome: Progressing   Problem: Clinical Measurements: Goal: Postoperative complications will be avoided or minimized Outcome: Progressing   Problem: Self-Care: Goal: Ability to meet self-care needs will improve Outcome: Progressing   Problem: Self-Concept: Goal: Ability to maintain and perform role responsibilities to the fullest extent possible will improve Outcome: Progressing   Problem: Education: Goal: Knowledge of General Education information will improve Description: Including pain rating scale, medication(s)/side effects and non-pharmacologic comfort measures Outcome: Progressing   Problem: Health Behavior/Discharge Planning: Goal: Ability to manage health-related needs will improve Outcome: Progressing   Problem: Clinical Measurements: Goal: Ability to maintain clinical measurements within normal limits will improve Outcome: Progressing Goal: Will remain free from infection Outcome: Progressing Goal: Diagnostic test results will improve Outcome: Progressing Goal: Respiratory complications will improve Outcome: Progressing Goal: Cardiovascular complication will be avoided Outcome:  Progressing   Problem: Activity: Goal: Risk for activity intolerance will decrease Outcome: Progressing   Problem: Nutrition: Goal: Adequate nutrition will be maintained Outcome: Progressing   Problem: Coping: Goal: Level of anxiety will decrease Outcome: Progressing   Problem: Elimination: Goal: Will not experience complications related to bowel motility Outcome: Progressing Goal: Will not experience complications related to urinary retention Outcome: Progressing   Problem: Pain Management: Goal: General experience of comfort will improve Outcome: Progressing   Problem: Safety: Goal: Ability to remain free from injury will improve Outcome: Progressing   Problem: Skin Integrity: Goal: Risk for  impaired skin integrity will decrease Outcome: Progressing

## 2023-06-29 NOTE — TOC Transition Note (Signed)
 Transition of Care [facility]) - Discharge Note   Patient Details  Name: Alan Tapia. MRN: [id number] Date of Birth: [birth year]  [facility] CM/SW Contact:  Carley Hammed, LCSW Phone Number: 06/29/2023, 11:01 AM   Clinical Narrative:    Pt to be transported to [facility] rehab via [place]. Nurse to call report to [phone]. CSW updated DSS and [facility] for prosthetics. Pt to update family.    Final next level of care: Skilled Nursing Facility Barriers to Discharge: Barriers Resolved   Patient Goals and CMS Choice Patient states their goals for this hospitalization and ongoing recovery are:: to be determined          Discharge Placement              Patient chooses bed at: [facility] Patient to be transferred to facility by: [facility] Name of family member notified: N/A (pt to notify) Patient and family notified of of transfer: 06/29/23  Discharge Plan and Services Additional resources added to the After Visit Summary for   In-house Referral: Clinical Social Work                                   Social Drivers of Health (SDOH) Interventions SDOH Screenings   Food Insecurity: No Food Insecurity (04/07/2023)  Housing: Unknown (04/23/2023)  Recent Concern: Housing - Medium Risk (04/07/2023)  Transportation Needs: No Transportation Needs (04/07/2023)  Recent Concern: Transportation Needs - Unmet Transportation Needs (02/17/2023)   Received from [facility]  Utilities: At Risk (04/07/2023)  Alcohol Screen: Low Risk  (08/20/2022)  Financial Resource Strain: Patient Unable To Answer (08/20/2022)  Tobacco Use: Low Risk  (04/08/2023)     Readmission Risk Interventions    04/07/2023    2:34 PM  Readmission Risk Prevention Plan  Transportation Screening Complete  PCP or Specialist Appt within 3-5 Days Complete  HRI or Home Care Consult Complete  Social Work Consult for Recovery Care Planning/Counseling Complete  Palliative Care  Screening Not Applicable  Medication Review [facility]) Complete

## 2023-07-02 ENCOUNTER — Telehealth: Payer: Self-pay | Admitting: Orthopedic Surgery

## 2023-07-02 NOTE — Telephone Encounter (Signed)
 Patient wife called requesting a call back please. [name] [phone]

## 2023-07-03 NOTE — Telephone Encounter (Signed)
 SW patient wife. She says that he is having a hard time with getting his other leg a prosthetic.  Some issue with insurance. Hospital got him fitted and casted for the first leg just fine. Something with the order for the other one has an issue. She said [employer] [employer] has been helping them out and she was under Dr. Audrie Lia department. I let her know that we do not have an Zollie Scale that works here in the office. I advised may be someone with [facility]. I advised her to call [facility], as they are the ones that process Rx's and insurance claims. If there is an issue [facility] will contact us. She said she will call [facility] today.

## 2023-07-07 ENCOUNTER — Encounter: Payer: Self-pay | Admitting: Family

## 2023-07-07 ENCOUNTER — Ambulatory Visit (INDEPENDENT_AMBULATORY_CARE_PROVIDER_SITE_OTHER): Payer: Medicaid Other | Admitting: Family

## 2023-07-07 DIAGNOSIS — S88112D Complete traumatic amputation at level between knee and ankle, left lower leg, subsequent encounter: Secondary | ICD-10-CM

## 2023-07-07 DIAGNOSIS — Z89512 Acquired absence of left leg below knee: Secondary | ICD-10-CM

## 2023-07-07 DIAGNOSIS — Z89511 Acquired absence of right leg below knee: Secondary | ICD-10-CM

## 2023-07-07 NOTE — Progress Notes (Signed)
 Post-Op Visit Note   Patient: Alan Tapia.           Date of Birth: 31-May-1975           MRN: [id number] Visit Date: 07/07/2023 PCP: Patient, No Pcp Per  Chief Complaint:  Chief Complaint  Patient presents with   Right Leg - Routine Post Op    04/08/2023 right BKA     HPI:  HPI The patient is a 48 year old gentleman who is seen status post right below-knee amputation.  He is also status post remote left below-knee amputation  He reports his left prosthesis is open today and it is large due to volume loss.  Patient is a new right transtibial  amputee.  Patient's current comorbidities are not expected to impact the ability to function with the prescribed prosthesis. Patient verbally communicates a strong desire to use a prosthesis. Patient currently requires mobility aids to ambulate without a prosthesis.  Expects not to use mobility aids with a new prosthesis.  Patient is a K3 level ambulator that spends a lot of time walking around on uneven terrain over obstacles, up and down stairs, and ambulates with a variable cadence.  Patient is an existing left transtibial  amputee.  Patient's current comorbidities are not expected to impact the ability to function with the prescribed prosthesis. Patient verbally communicates a strong desire to use a prosthesis. Patient currently requires mobility aids to ambulate without a prosthesis.  Expects not to use mobility aids with a new prosthesis.  Patient is a K3 level ambulator that spends a lot of time walking around on uneven terrain over obstacles, up and down stairs, and ambulates with a variable cadence.     Ortho Exam On examination right residual limb this is well-healed.  There is no open area of the residual limb is consolidating well.  On examination left residual limb this is well consolidated well-healed as well there is no callus no impending skin breakdown  Visit Diagnoses: No diagnosis found.  Plan: Proceed with  prosthesis set up for the right have also provided an order for socket replacement on the left.  He will follow-up as needed  Follow-Up Instructions: No follow-ups on file.   Imaging: No results found.  Orders:  No orders of the defined types were placed in this encounter.  No orders of the defined types were placed in this encounter.    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 04/07/2023   Insulin dependent type 2 diabetes mellitus (HCC) 04/07/2023   CKD (chronic kidney disease), stage II 04/07/2023   Hx of left BKA (HCC) 04/07/2023   PVD (peripheral vascular disease) (HCC) 04/07/2023   Constipation 09/11/2022   Type 2 diabetes mellitus with hyperglycemia, with long-term current use of insulin (HCC) 09/09/2022   Depressed left ventricular ejection fraction 09/05/2022   Phantom pain 09/02/2022   Adjustment disorder 08/29/2022   Essential hypertension 08/27/2022  Dilated cardiomyopathy (HCC) 08/26/2022   Below-knee amputation of left lower extremity (HCC) 08/22/2022   Sepsis (HCC) 08/15/2022   Uncontrolled type 2 diabetes mellitus with hyperglycemia, with long-term current use of insulin (HCC) 06/06/2022   Past Medical History:  Diagnosis Date   Hypertension    Uncontrolled type 2 diabetes mellitus with hyperglycemia, with long-term current use of insulin (HCC) 06/06/2022    Family History  Problem Relation Age of  Onset   Arthritis Mother    Allergies Father    Hypertension Father    Diabetes Father     Past Surgical History:  Procedure Laterality Date   AMPUTATION Left 08/15/2022   Procedure: LEFT BELOW KNEE AMPUTATION;  Surgeon: Nadara Mustard, MD;  Location: [facility] OR;  Service: Orthopedics;  Laterality: Left;   AMPUTATION Right 04/08/2023   Procedure: RIGHT BELOW KNEE AMPUTATION;  Surgeon: Nadara Mustard, MD;  Location[facility] OR;  Service: Orthopedics;  Laterality: Right;   Social History   Occupational History    Comment: Just moved to [place] from [name]  Tobacco Use   Smoking status: Never   Smokeless tobacco: Never  Vaping Use   Vaping status: Never Used  Substance and Sexual Activity   Alcohol use: No   Drug use: No   Sexual activity: Not on file

## 2023-08-25 ENCOUNTER — Telehealth: Admitting: Family Medicine

## 2023-08-25 DIAGNOSIS — K649 Unspecified hemorrhoids: Secondary | ICD-10-CM

## 2023-08-25 NOTE — Progress Notes (Signed)
 Unable to connect via video, tried calling but still does not appear to hear me on phone. Disconnected several times.  And video retried without success.   No charge.

## 2023-08-31 ENCOUNTER — Encounter (HOSPITAL_BASED_OUTPATIENT_CLINIC_OR_DEPARTMENT_OTHER): Payer: Self-pay | Admitting: *Deleted

## 2023-08-31 ENCOUNTER — Ambulatory Visit (HOSPITAL_BASED_OUTPATIENT_CLINIC_OR_DEPARTMENT_OTHER): Admitting: Family Medicine

## 2023-09-03 ENCOUNTER — Telehealth: Admitting: Physician Assistant

## 2023-09-03 NOTE — Progress Notes (Signed)
 No show for appt.

## 2023-09-04 ENCOUNTER — Ambulatory Visit: Admitting: Nurse Practitioner

## 2023-09-04 ENCOUNTER — Telehealth: Admitting: Physician Assistant

## 2023-09-04 DIAGNOSIS — R300 Dysuria: Secondary | ICD-10-CM

## 2023-09-04 NOTE — Progress Notes (Signed)
 E-Visit for Urinary Problems  Based on what you shared with me, I feel your condition warrants further evaluation and I recommend that you be seen for a face to face office visit.  Male bladder infections are not very common.  We worry about prostate or kidney conditions.  The standard of care is to examine the abdomen and kidneys, and to do a urine and blood test to make sure that something more serious is not going on.  We recommend that you see a provider today.  If your doctor's office is closed [facility] has the following Urgent Cares:   NOTE: There will be NO CHARGE for this E-Visit   If you are having a true medical emergency, please call 911.     For an urgent face to face visit, [facility] has multiple urgent care centers for your convenience.  Click the link below for the full list of locations and hours, walk-in wait times, appointment scheduling options and driving directions:  Urgent Care - [place], [place], [place], [place], [place], [name]  [place]     Your MyChart E-visit questionnaire answers were reviewed by a board certified advanced clinical practitioner to complete your personal care plan based on your specific symptoms.  Thank you for using e-Visits.        I have spent 5 minutes in review of e-visit questionnaire, review and updating patient chart, medical decision making and response to patient.   Angelia Kelp, PA-C

## 2023-09-05 ENCOUNTER — Encounter: Payer: Self-pay | Admitting: Physician Assistant

## 2023-09-05 ENCOUNTER — Telehealth: Admitting: Physician Assistant

## 2023-09-05 DIAGNOSIS — R300 Dysuria: Secondary | ICD-10-CM

## 2023-09-05 DIAGNOSIS — Z91199 Patient's noncompliance with other medical treatment and regimen due to unspecified reason: Secondary | ICD-10-CM

## 2023-09-05 NOTE — Patient Instructions (Signed)
 Alan Tapia., thank you for joining Alan Settle, PA-C for today's virtual visit.  While this provider is not your primary care provider (PCP), if your PCP is located in our provider database this encounter information will be shared with them immediately following your visit.   A [facility] MyChart account gives you access to today's visit and all your visits, tests, and labs performed at [facility] " click here if you don't have a [facility] MyChart account or go to [email or web address]  Consent: (Patient) Alan Tapia. provided verbal consent for this virtual visit at the beginning of the encounter.  Current Medications:  Current Outpatient Medications:    acetaminophen  (TYLENOL ) 500 MG tablet, Take 2 tablets (1,000 mg total) by mouth every 8 (eight) hours as needed for mild pain (pain score 1-3), fever or headache (or temp > 100.5)., Disp: 30 tablet, Rfl: 0   amLODipine  (NORVASC ) 5 MG tablet, Take 1 tablet (5 mg total) by mouth daily., Disp: , Rfl:    diclofenac  Sodium (VOLTAREN ) 1 % GEL, Apply 2 g topically 4 (four) times daily as needed (areas of pain.)., Disp: , Rfl:    hydrOXYzine  (ATARAX ) 25 MG tablet, Take 1 tablet (25 mg total) by mouth 3 (three) times daily as needed for itching or anxiety., Disp: , Rfl:    lidocaine  (LIDODERM ) 5 %, Place 2 patches onto the skin daily. Remove & Discard patch within 12 hours or as directed by MD, Disp: , Rfl:    melatonin 5 MG TABS, Take 1 tablet (5 mg total) by mouth at bedtime. (Patient not taking: Reported on 04/07/2023), Disp: 30 tablet, Rfl: 0   metoprolol  succinate (TOPROL -XL) 200 MG 24 hr tablet, Take 1 tablet (200 mg total) by mouth daily. Take with or immediately following a meal., Disp: 30 tablet, Rfl: 0   ondansetron  (ZOFRAN ) 4 MG tablet, Take 1 tablet (4 mg total) by mouth every 8 (eight) hours as needed for nausea or vomiting., Disp: 30 tablet, Rfl: 1   pantoprazole  (PROTONIX ) 40 MG tablet, Take 1 tablet (40  mg total) by mouth 2 (two) times daily., Disp: , Rfl:    polyethylene glycol (MIRALAX  / GLYCOLAX ) 17 g packet, Take 17 g by mouth 2 (two) times daily., Disp: , Rfl:    sacubitril -valsartan  (ENTRESTO ) 97-103 MG, Take 1 tablet by mouth 2 (two) times daily., Disp: , Rfl:    simethicone  (MYLICON) 80 MG chewable tablet, Chew 1 tablet (80 mg total) by mouth 4 (four) times daily as needed for flatulence., Disp: , Rfl:    Medications ordered in this encounter:  No orders of the defined types were placed in this encounter.    *If you need refills on other medications prior to your next appointment, please contact your pharmacy*  Follow-Up: Call back or seek an in-person evaluation if the symptoms worsen or if the condition fails to improve as anticipated.  [facility] [phone]  Other Instructions Report to nearest ER.   If you have been instructed to have an in-person evaluation today at a local Urgent Care facility, please use the link below. It will take you to a list of all of our available [facility], including address, phone number and hours of operation. Please do not delay care.  [facility]  If you or a family member do not have a primary care provider, use the link below to schedule a visit and establish care. When you choose a [facility]  primary care physician or advanced practice provider, you gain a long-term partner in health. Find a Primary Care Provider  Learn more about [facility]'s in-office and virtual care options: [facility] - Get Care Now

## 2023-09-05 NOTE — Progress Notes (Signed)
 The patient no-showed for appointment despite this provider sending direct link, reaching out via phone with no response and waiting for at least 10 minutes from appointment time for patient to join. They will be marked as a NS for this appointment/time.   Laure Kidney, PA-C

## 2023-09-05 NOTE — Progress Notes (Signed)
 Virtual Visit Consent   Alan Tapia., you are scheduled for a virtual visit with a [facility] provider today. Just as with appointments in the office, your consent must be obtained to participate. Your consent will be active for this visit and any virtual visit you may have with one of our providers in the next 365 days. If you have a MyChart account, a copy of this consent can be sent to you electronically.  As this is a virtual visit, video technology does not allow for your provider to perform a traditional examination. This may limit your provider's ability to fully assess your condition. If your provider identifies any concerns that need to be evaluated in person or the need to arrange testing (such as labs, EKG, etc.), we will make arrangements to do so. Although advances in technology are sophisticated, we cannot ensure that it will always work on either your end or our end. If the connection with a video visit is poor, the visit may have to be switched to a telephone visit. With either a video or telephone visit, we are not always able to ensure that we have a secure connection.  By engaging in this virtual visit, you consent to the provision of healthcare and authorize for your insurance to be billed (if applicable) for the services provided during this visit. Depending on your insurance coverage, you may receive a charge related to this service.  I need to obtain your verbal consent now. Are you willing to proceed with your visit today? Alan Tapia. has provided verbal consent on 09/05/2023 for a virtual visit (video or telephone). Alan Tapia, [place]  Date: 09/05/2023 2:39 PM   Virtual Visit via Video Note   I, Alan Tapia, connected with  Arvester Brandow.  ([id number], 07/19/1975) on 09/05/23 at  2:30 PM EDT by a video-enabled telemedicine application and verified that I am speaking with the correct person using two identifiers.  Location: Patient: Virtual Visit Location  Patient: Home Provider: Virtual Visit Location Provider: Home Office   I discussed the limitations of evaluation and management by telemedicine and the availability of in person appointments. The patient expressed understanding and agreed to proceed.    History of Present Illness: Alan Tapia. is a 48 y.o. who identifies as a male who was assigned male at birth, and is being seen today for dysuria and incontinence.  HPI: Dysuria  This is a new problem. The problem occurs every urination. The problem has been rapidly worsening. There has been no fever. He is Not sexually active. There is No history of pyelonephritis. Associated symptoms include urgency. The treatment provided no relief.    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 04/07/2023   Insulin  dependent type 2 diabetes mellitus (HCC) 04/07/2023   CKD (chronic kidney disease), stage II 04/07/2023   Hx of left BKA (HCC) 04/07/2023   PVD (peripheral vascular disease) (HCC) 04/07/2023  Constipation 09/11/2022   Type 2 diabetes mellitus with hyperglycemia, with long-term current use of insulin  (HCC) 09/09/2022   Depressed left ventricular ejection fraction 09/05/2022   Phantom pain 09/02/2022   Adjustment disorder 08/29/2022   Essential hypertension 08/27/2022   Dilated cardiomyopathy (HCC) 08/26/2022   Below-knee amputation of left lower extremity (HCC) 08/22/2022    Sepsis (HCC) 08/15/2022   Uncontrolled type 2 diabetes mellitus with hyperglycemia, with long-term current use of insulin  (HCC) 06/06/2022    Allergies:  Allergies  Allergen Reactions   Fish Allergy Anaphylaxis, Hives and Other (See Comments)   Benadryl  [Diphenhydramine ] Swelling    Pt reports cannot take pill form of benadryl    Cefepime  Other (See Comments)    Redness of the neck and cheek.  Concern for rash   Coreg  [Carvedilol ] Nausea Only   Fish-Derived Products    Vancomycin  Hives    Redman's-type rash on 08/13/22   Medications:  Current Outpatient Medications:    acetaminophen  (TYLENOL ) 500 MG tablet, Take 2 tablets (1,000 mg total) by mouth every 8 (eight) hours as needed for mild pain (pain score 1-3), fever or headache (or temp > 100.5)., Disp: 30 tablet, Rfl: 0   amLODipine  (NORVASC ) 5 MG tablet, Take 1 tablet (5 mg total) by mouth daily., Disp: , Rfl:    diclofenac  Sodium (VOLTAREN ) 1 % GEL, Apply 2 g topically 4 (four) times daily as needed (areas of pain.)., Disp: , Rfl:    hydrOXYzine  (ATARAX ) 25 MG tablet, Take 1 tablet (25 mg total) by mouth 3 (three) times daily as needed for itching or anxiety., Disp: , Rfl:    lidocaine  (LIDODERM ) 5 %, Place 2 patches onto the skin daily. Remove & Discard patch within 12 hours or as directed by MD, Disp: , Rfl:    melatonin 5 MG TABS, Take 1 tablet (5 mg total) by mouth at bedtime. (Patient not taking: Reported on 04/07/2023), Disp: 30 tablet, Rfl: 0   metoprolol  succinate (TOPROL -XL) 200 MG 24 hr tablet, Take 1 tablet (200 mg total) by mouth daily. Take with or immediately following a meal., Disp: 30 tablet, Rfl: 0   ondansetron  (ZOFRAN ) 4 MG tablet, Take 1 tablet (4 mg total) by mouth every 8 (eight) hours as needed for nausea or vomiting., Disp: 30 tablet, Rfl: 1   pantoprazole  (PROTONIX ) 40 MG tablet, Take 1 tablet (40 mg total) by mouth 2 (two) times daily., Disp: , Rfl:    polyethylene glycol (MIRALAX  / GLYCOLAX ) 17 g packet, Take 17  g by mouth 2 (two) times daily., Disp: , Rfl:    sacubitril -valsartan  (ENTRESTO ) 97-103 MG, Take 1 tablet by mouth 2 (two) times daily., Disp: , Rfl:    simethicone  (MYLICON) 80 MG chewable tablet, Chew 1 tablet (80 mg total) by mouth 4 (four) times daily as needed for flatulence., Disp: , Rfl:   Observations/Objective: Patient is well-developed, well-nourished in no acute distress.  Resting comfortably  at home.  Head is normocephalic, atraumatic.  No labored breathing.  Speech is clear and coherent with logical content.  Patient is alert and oriented at baseline.    Assessment and Plan: 1. Dysuria (Primary)  Patient again requesting incontinence supplies. I took my time on today to explain that the previous provider had concerns regarding dysuria. I explained to the patient and family that this patient needs to go to the ER for evaluation due to his multiple conditions and his current compliant. They are to report to the nearest ER immediately for evaluation.  Follow Up Instructions: I discussed the assessment and treatment plan with the patient. The patient was provided an opportunity to ask questions and all were answered. The patient agreed with the plan and demonstrated an understanding of the instructions.  A copy of instructions were sent to the patient via MyChart unless otherwise noted below.   The patient was advised to call back or seek an in-person evaluation if the symptoms worsen or if the condition fails to improve as anticipated.    Alan Settle, PA-C

## 2023-09-10 ENCOUNTER — Ambulatory Visit: Payer: Self-pay

## 2023-09-11 ENCOUNTER — Telehealth: Payer: Self-pay | Admitting: Radiology

## 2023-09-11 ENCOUNTER — Telehealth: Payer: Self-pay | Admitting: Orthopedic Surgery

## 2023-09-11 NOTE — Telephone Encounter (Signed)
 Patients wife called asking if Dr. Julio Ohm can send in an Rx for adult diapers.   States patients had RT BKA on 04/08/23 and has been in 2 rehab facilities. Last facility was [facility] in [place].  Patient is now home and completely out of diapers.  I told spouse I'm not sure if this is something Dr. Julio Ohm usually fills unless it is for home health care but I would send to assistants.

## 2023-09-11 NOTE — Telephone Encounter (Signed)
 Patient's wife called. She would like a referral sent to a Urologist for patient. Her cb# [phone]

## 2023-09-14 NOTE — Telephone Encounter (Signed)
 Duplicate I called and lm on vm to advise that this request will have to come from PCP

## 2023-09-14 NOTE — Telephone Encounter (Signed)
 I called and lm on vm to advise that pt should ask PCp for urology referral and incontinence supplies.To call with any other questions.

## 2023-09-15 ENCOUNTER — Ambulatory Visit (HOSPITAL_BASED_OUTPATIENT_CLINIC_OR_DEPARTMENT_OTHER): Admitting: Family Medicine

## 2023-09-16 ENCOUNTER — Telehealth (HOSPITAL_BASED_OUTPATIENT_CLINIC_OR_DEPARTMENT_OTHER): Payer: Self-pay | Admitting: *Deleted

## 2023-09-16 NOTE — Telephone Encounter (Signed)
 Not our patient please reroute

## 2023-09-16 NOTE — Telephone Encounter (Signed)
 Copied from CRM [id number]. Topic: General - Other >> Sep 16, 2023  1:03 PM Jeris Montes S wrote: Reason for CRM: Pt request a care coordinator or case manager give a cb [phone] has some quests regarding the pt's care

## 2023-10-21 ENCOUNTER — Ambulatory Visit: Admitting: Family Medicine

## 2023-10-22 ENCOUNTER — Telehealth: Payer: Self-pay

## 2023-10-22 ENCOUNTER — Ambulatory Visit: Admitting: Emergency Medicine

## 2023-10-22 NOTE — Telephone Encounter (Signed)
 Att to contact pt to confirm appt w/[facility] no ans lvm

## 2023-11-02 ENCOUNTER — Telehealth: Payer: Self-pay

## 2023-11-02 DIAGNOSIS — Z89511 Acquired absence of right leg below knee: Secondary | ICD-10-CM

## 2023-11-02 NOTE — Telephone Encounter (Signed)
 Patient called and states he would like a Referral to [facility]. S/P Right BKA 04/08/2023.  CB [phone]

## 2023-11-02 NOTE — Telephone Encounter (Signed)
 Autumn- I have entered it. You find it under the physical medicine tab where you send Dr. Lyda things. Let me know if I can do anything else!

## 2023-11-02 NOTE — Addendum Note (Signed)
 Addended by: TRINDA DEANE HERO on: 11/02/2023 01:27 PM   Modules accepted: Orders

## 2023-11-03 ENCOUNTER — Telehealth (HOSPITAL_BASED_OUTPATIENT_CLINIC_OR_DEPARTMENT_OTHER): Payer: Self-pay | Admitting: *Deleted

## 2023-11-03 ENCOUNTER — Telehealth: Payer: Self-pay | Admitting: Orthopedic Surgery

## 2023-11-03 NOTE — Telephone Encounter (Signed)
 Patient called and ask if he can get a prescription for adult briefs. Also he said that duda put in a referral to his PCP and he was in surgery when he had an appointment and the doctor thinks he missed his appointment on purpose so he needs proof that he was in the hospital due to surgery. CB#[phone]

## 2023-11-03 NOTE — Telephone Encounter (Signed)
 Patient is no longer our patient has been dismissed since 5/15

## 2023-11-03 NOTE — Telephone Encounter (Signed)
 Copied from CRM [id number]. Topic: Appointments - Scheduling Inquiry for Clinic >> Nov 03, 2023  9:49 AM Delon DASEN wrote: Reason for CRM: Wife [name] asking if referral needed for physical therapy- he does have a prescription for it- please call [phone]

## 2023-11-04 NOTE — Telephone Encounter (Signed)
 I called and lm on vm to advise that Dr. Harden does not give orders for continence supplies and that this should come from PCP. Also this physician is part of [facility] and can see in the pt's chart he was in the hospital from 04/06/2023 until 06/29/2023. There was 1 appt that was cancelled on 06/22/2023 and he was in the hospital at that time but the office would be able to see this in the chart. To call with any other questions.

## 2023-11-10 ENCOUNTER — Encounter: Attending: Physical Medicine & Rehabilitation | Admitting: Physical Medicine & Rehabilitation

## 2023-11-12 ENCOUNTER — Other Ambulatory Visit: Payer: Self-pay | Admitting: Orthopedic Surgery

## 2023-11-12 DIAGNOSIS — Z89512 Acquired absence of left leg below knee: Secondary | ICD-10-CM

## 2023-11-12 DIAGNOSIS — S88112D Complete traumatic amputation at level between knee and ankle, left lower leg, subsequent encounter: Secondary | ICD-10-CM

## 2023-11-18 NOTE — Progress Notes (Signed)
 Virtual Primary Care-- Acute Care Visit    Patient: [name] [id number] Age: DOB: 48 y.o. 28-Jul-1975     Location Information: Patient State (at time of visit): [place]  Patient Location (at time of visit):Home/Other Non-Medical  Provider Location: Out of State; Other [place]  Is provider licensed to provide clinical care in the current location/state of the patient? Yes   Consent:  Patient's identity was confirmed. Presenting condition or illness was discussed with the patient/personal representative. Current proposed treatment for presenting condition or illness was explained to patient/personal representative along with the likely benefits and any significant risks or complications associated with the provision of treatment by audio/video means. The patient/personal representative verbally authorized treatment to be provided by audio/video, which may include a limited review of patient's current health status, medication, or other treatment recommendations, patient education, and an opportunity to ask questions about condition and treatment. Verbal Consent Granted by Patient/Personal Representative:Yes   Visit Information: Modality: 2-Way Real-Time Audio/Video  Video Start Time: 8:04 AM Video Stop Time: 8:10 am Video Total Time: 42m 39s     Most recent PHQ-2 results: Patient Health Questionnaire-2 Score: 0 (12/16/2022  3:34 PM)  Interpretation: PHQ-2 Interpretation: Negative (None-minimal Depression Severity) (12/16/2022  3:34 PM)    Depression Plan: Normal/Negative Screening    HISTORY OF PRESENT ILLNESS   Alan Tapia is a 48 y.o. male who presents acutely for evaluation of:   Answers submitted by the patient for this visit: Painful Urination Questionnaire (Submitted on 11/18/2023) Chief Complaint: Dysuria Chronicity: new Onset: yesterday Frequency: every urination Progression since onset: unchanged Pain quality: aching, burning Pain  severity: moderate Pain - numeric: 3/10 Fever: no fever Sexually active?: Yes History of pyelonephritis?: No hesitancy: Yes discharge: No possible pregnancy: No sweats: No   Wife states that he is having pain with urination. Is able to urinate.    Recently had a BKA of the LLE.  Wife also notes that his stomach is very swollen as is his eyes, and chest.  Says he was given medicine in the past to help urinate.    Wife notes pt is suppose to be on 2L nasal cannula.   Review of Systems  Constitutional:  Negative for chills.  Gastrointestinal:  Negative for nausea and vomiting.  Genitourinary:  Positive for dysuria and urgency. Negative for flank pain, frequency and hematuria.           Past Medical History    Problem List[1] Surgical History[2] Allergies[3]   Medications  Current Medications[4]   Objective OBJECTIVE   There were no vitals filed for this visit.  No height and weight on file for this encounter.   Physical Exam Constitutional:      Comments: Pt seems lethargic.    Pulmonary:     Comments: [place] in place.  Pt does seem to be breathing shallow and rapidly Abdominal:     Comments: Stomach looks distended on video visit.        Assessment/Plan ASSESSMENT & PLAN   [name] is a 48 y.o. male who presents acutely for evaluation of dysuria.   Diagnoses and all orders for this visit:  Abdominal swelling  Shortness of breath  Dysuria  During the course of the visit audio was going in and out.  Patient was noted to answer my questions wife was having to do the talking.  Patient was responding and talking aggressively towards wife.  Wife was complaining about patient having pain with urination she was also discussing him  being abdominal swelling.  Through the video visit patient did seem lethargic and was seeming to be short of breath.  While trying to figure out how much oxygen patient was currently taking the call disconnected.  Patient never  reconnected through care agility.  I tried to reach out through [employer] that they never connected.  I do feel that patient needs an in person visit for further evaluation.  Patient needs evaluation on the abdominal swelling shortness of breath will also need urine sample for testing for a UTI.  I have sent patient a MyChart message recommending in person visit also having nursing staff try to reach out the patient as well and advised in person visit.    No follow-ups on file.  @Disposition :  Patient referred to emergency room for additional evaluation and management. @   MH     This dictation was prepared using Air traffic controller. As a result, errors may occur that include individual words and phrases .When identified, these errors have been corrected. While every attempt is made to correct these errors during dictation, errors may still exist. Adine Martyr, DO, 11/18/23, 8:04 AM  Adine Martyr, DO 11/18/2023                                 [1] Patient Active Problem List Diagnosis  . Subacute osteomyelitis, left ankle and foot    (CMD)  . Lymphedema  . Dilated cardiomyopathy    (CMD)  . Diabetic foot infection    (CMD)  . Depressed left ventricular ejection fraction  . Constipation  . Below-knee amputation of left lower extremity    (CMD)  . Adjustment disorder  . Primary hypertension  . Phantom pain  . Type 2 diabetes mellitus with hyperglycemia, with long-term current use of insulin     (CMD)  . Acute kidney injury  . Class 3 severe obesity due to excess calories with body mass index (BMI) of 45.0 to 49.9 in adult  . Difficulty walking  . Hx of BKA, left    (CMD)  . Diabetic foot    (CMD)  [2] No past surgical history on file. [3] Allergies Allergen Reactions  . Carvedilol  Nausea Only  . Diphenhydramine  Swelling    Pt reports cannot take pill form of benadryl   Allergic to red dye in pink pill Benadryl   . Fish Containing Products Hives     Temporary blindness per pt  . Pink Dye   . Red Dye Swelling    only had a reaction to the red benadryl  tablets  . Vancomycin      Redman's-type rash on 08/13/22  [4]  Current Outpatient Medications:  .  acetaminophen  (TYLENOL ) 325 mg tablet, Take 650 mg by mouth every 6 (six) hours as needed for mild pain (1-3)., Disp: , Rfl:  .  alcohol swabs  padm, 1 each by Apply Externally  route 3 (three) times a day., Disp: , Rfl:  .  ascorbic acid  (VITAMIN C ) 1,000 mg tablet, Take 1,000 mg by mouth daily., Disp: , Rfl:  .  blood-glucose meter (Accu-Chek Guide Glucose Meter) misc, Use to check blood glucose 3 times daily, Disp: 1 each, Rfl: 0 .  calcium alginate-honey 4 X 5  bndg, Apply daily to wound, Disp: 10 each, Rfl: 1 .  carvediloL  (COREG ) 25 mg tablet, Take 1 tablet (25 mg total) by mouth in the morning and 1 tablet (25 mg total) in the  evening. Take with meals., Disp: 180 tablet, Rfl: 0 .  furosemide  (LASIX ) 40 mg tablet, Take 1 tablet (40 mg total) by mouth daily., Disp: 90 tablet, Rfl: 3 .  glucose blood (Accu-Chek Guide test strips) test strip, 1 each by Other route as needed (BG checks)., Disp: 100 each, Rfl: 3 .  insulin  aspart U-100 (NovoLOG  Flexpen U-100 Insulin ) 100 unit/mL (3 mL) pen, Inject 0-11 Units under the skin 3 (three) times a day before meals., Disp: 9 mL, Rfl: 3 .  insulin  glargine (Semglee  Pen U-100 Insulin ) 100 unit/mL (3 mL) pen, Inject 12 Units under the skin in the morning., Disp: 6 mL, Rfl: 3 .  Lancets (Accu-Chek Softclix Lancets) misc, Inject 1 each under the skin 3 (three) times a day., Disp: 100 each, Rfl: 3 .  melatonin 10 mg tablet, Take 10 mg by mouth nightly as needed., Disp: , Rfl:  .  methocarbamoL  (ROBAXIN ) 750 mg tablet, Take 1 tablet (750 mg total) by mouth 3 (three) times a day. (Patient taking differently: Take 750 mg by mouth 3 (three) times a day as needed.), Disp: 30 tablet, Rfl: 1 .  NIFEdipine (ADALAT CC) 60 mg TbER ER tablet, Take 1 tablet (60 mg total)  by mouth daily., Disp: 90 tablet, Rfl: 0 .  pen needle, diabetic 31 gauge x 5/16 ndle, Pen Needles: 4 mm, Inject insulin  2 times a day, ICD-10 Code: Diabetes Diagnosis Code: Type 2 - E11.9, Disp: 60 each, Rfl: 3 .  sennosides-docusate sodium  (PERICOLACE) 8.6-50 mg per tablet, Take 2 tablets by mouth 2 (two) times a day., Disp: 60 tablet, Rfl: 0 .  syringe with needle, insulin  (INSULIN  SYRINGE-NEEDLE U-100 MISC), Inject 1 each under the skin 3 (three) times a day., Disp: , Rfl:  .  traZODone  (DESYREL ) 50 mg tablet, Take 1 tablet (50 mg total) by mouth at bedtime., Disp: 30 tablet, Rfl: 3 .  white petrolatum 61 % crea, Apply 1 Application topically 2 (two) times a day as needed (apply to affeceted areas)., Disp: 99 g, Rfl: 1 .  zinc  oxide 20 % ointment, Apply 1 Application topically as needed for irritation., Disp: , Rfl:

## 2023-11-26 ENCOUNTER — Ambulatory Visit (HOSPITAL_BASED_OUTPATIENT_CLINIC_OR_DEPARTMENT_OTHER): Admitting: Family Medicine

## 2023-12-02 ENCOUNTER — Ambulatory Visit (HOSPITAL_BASED_OUTPATIENT_CLINIC_OR_DEPARTMENT_OTHER): Admitting: Family Medicine

## 2023-12-03 ENCOUNTER — Emergency Department (HOSPITAL_COMMUNITY)

## 2023-12-03 ENCOUNTER — Inpatient Hospital Stay (HOSPITAL_COMMUNITY)
Admission: EM | Admit: 2023-12-03 | Discharge: 2023-12-08 | DRG: 291 | Disposition: A | Discharge status: Home / Self Care | Attending: Internal Medicine | Admitting: Internal Medicine

## 2023-12-03 DIAGNOSIS — R809 Proteinuria, unspecified: Secondary | ICD-10-CM | POA: Insufficient documentation

## 2023-12-03 DIAGNOSIS — I160 Hypertensive urgency: Secondary | ICD-10-CM | POA: Diagnosis present

## 2023-12-03 DIAGNOSIS — Z794 Long term (current) use of insulin: Secondary | ICD-10-CM

## 2023-12-03 DIAGNOSIS — Z556 Problems related to health literacy: Secondary | ICD-10-CM | POA: Insufficient documentation

## 2023-12-03 DIAGNOSIS — Z8261 Family history of arthritis: Secondary | ICD-10-CM

## 2023-12-03 DIAGNOSIS — E119 Type 2 diabetes mellitus without complications: Secondary | ICD-10-CM | POA: Diagnosis not present

## 2023-12-03 DIAGNOSIS — E1140 Type 2 diabetes mellitus with diabetic neuropathy, unspecified: Secondary | ICD-10-CM | POA: Diagnosis present

## 2023-12-03 DIAGNOSIS — Z6841 Body Mass Index (BMI) 40.0 and over, adult: Secondary | ICD-10-CM

## 2023-12-03 DIAGNOSIS — Z79899 Other long term (current) drug therapy: Secondary | ICD-10-CM | POA: Diagnosis not present

## 2023-12-03 DIAGNOSIS — Z89511 Acquired absence of right leg below knee: Secondary | ICD-10-CM | POA: Diagnosis not present

## 2023-12-03 DIAGNOSIS — Z8249 Family history of ischemic heart disease and other diseases of the circulatory system: Secondary | ICD-10-CM | POA: Diagnosis not present

## 2023-12-03 DIAGNOSIS — E66813 Obesity, class 3: Secondary | ICD-10-CM | POA: Diagnosis present

## 2023-12-03 DIAGNOSIS — Z993 Dependence on wheelchair: Secondary | ICD-10-CM

## 2023-12-03 DIAGNOSIS — N179 Acute kidney failure, unspecified: Secondary | ICD-10-CM | POA: Diagnosis present

## 2023-12-03 DIAGNOSIS — I130 Hypertensive heart and chronic kidney disease with heart failure and stage 1 through stage 4 chronic kidney disease, or unspecified chronic kidney disease: Principal | ICD-10-CM | POA: Diagnosis present

## 2023-12-03 DIAGNOSIS — Z91013 Allergy to seafood: Secondary | ICD-10-CM | POA: Diagnosis not present

## 2023-12-03 DIAGNOSIS — I509 Heart failure, unspecified: Secondary | ICD-10-CM | POA: Diagnosis present

## 2023-12-03 DIAGNOSIS — N182 Chronic kidney disease, stage 2 (mild): Secondary | ICD-10-CM | POA: Diagnosis present

## 2023-12-03 DIAGNOSIS — I739 Peripheral vascular disease, unspecified: Secondary | ICD-10-CM | POA: Diagnosis not present

## 2023-12-03 DIAGNOSIS — E8809 Other disorders of plasma-protein metabolism, not elsewhere classified: Secondary | ICD-10-CM | POA: Diagnosis present

## 2023-12-03 DIAGNOSIS — I5021 Acute systolic (congestive) heart failure: Principal | ICD-10-CM

## 2023-12-03 DIAGNOSIS — E1122 Type 2 diabetes mellitus with diabetic chronic kidney disease: Secondary | ICD-10-CM | POA: Diagnosis present

## 2023-12-03 DIAGNOSIS — I5043 Acute on chronic combined systolic (congestive) and diastolic (congestive) heart failure: Secondary | ICD-10-CM | POA: Insufficient documentation

## 2023-12-03 DIAGNOSIS — I5033 Acute on chronic diastolic (congestive) heart failure: Secondary | ICD-10-CM | POA: Insufficient documentation

## 2023-12-03 DIAGNOSIS — I10 Essential (primary) hypertension: Secondary | ICD-10-CM | POA: Diagnosis present

## 2023-12-03 DIAGNOSIS — E1151 Type 2 diabetes mellitus with diabetic peripheral angiopathy without gangrene: Secondary | ICD-10-CM | POA: Diagnosis present

## 2023-12-03 DIAGNOSIS — J9621 Acute and chronic respiratory failure with hypoxia: Secondary | ICD-10-CM | POA: Diagnosis present

## 2023-12-03 DIAGNOSIS — I5023 Acute on chronic systolic (congestive) heart failure: Secondary | ICD-10-CM | POA: Diagnosis present

## 2023-12-03 DIAGNOSIS — Z888 Allergy status to other drugs, medicaments and biological substances status: Secondary | ICD-10-CM

## 2023-12-03 DIAGNOSIS — E875 Hyperkalemia: Secondary | ICD-10-CM | POA: Diagnosis present

## 2023-12-03 DIAGNOSIS — Z89512 Acquired absence of left leg below knee: Secondary | ICD-10-CM

## 2023-12-03 DIAGNOSIS — Z91148 Patient's other noncompliance with medication regimen for other reason: Secondary | ICD-10-CM | POA: Diagnosis not present

## 2023-12-03 DIAGNOSIS — R601 Generalized edema: Secondary | ICD-10-CM | POA: Diagnosis not present

## 2023-12-03 DIAGNOSIS — Z833 Family history of diabetes mellitus: Secondary | ICD-10-CM

## 2023-12-03 LAB — URINALYSIS, W/ REFLEX TO CULTURE (INFECTION SUSPECTED)
Bacteria, UA: NONE SEEN
Bilirubin Urine: NEGATIVE
Glucose, UA: 50 mg/dL — AB
Hgb urine dipstick: NEGATIVE
Ketones, ur: NEGATIVE mg/dL
Leukocytes,Ua: NEGATIVE
Nitrite: NEGATIVE
Protein, ur: >=300 mg/dL — AB
Specific Gravity, Urine: 1.016 (ref 1.005–1.030)
pH: 6 (ref 5.0–8.0)

## 2023-12-03 LAB — COMPREHENSIVE METABOLIC PANEL WITH GFR
ALT: 11 U/L (ref 0–44)
AST: 15 U/L (ref 15–41)
Albumin: 1.8 g/dL — ABNORMAL LOW (ref 3.5–5.0)
Alkaline Phosphatase: 139 U/L — ABNORMAL HIGH (ref 38–126)
Anion gap: 7 (ref 5–15)
BUN: 24 mg/dL — ABNORMAL HIGH (ref 6–20)
CO2: 24 mmol/L (ref 22–32)
Calcium: 8 mg/dL — ABNORMAL LOW (ref 8.9–10.3)
Chloride: 107 mmol/L (ref 98–111)
Creatinine, Ser: 1.61 mg/dL — ABNORMAL HIGH (ref 0.61–1.24)
GFR, Estimated: 52 mL/min — ABNORMAL LOW (ref >60)
Glucose, Bld: 93 mg/dL (ref 70–99)
Potassium: 5.2 mmol/L — ABNORMAL HIGH (ref 3.5–5.1)
Sodium: 138 mmol/L (ref 135–145)
Total Bilirubin: 0.6 mg/dL (ref 0.0–1.2)
Total Protein: 6.1 g/dL — ABNORMAL LOW (ref 6.5–8.1)

## 2023-12-03 LAB — CBC
HCT: 41.9 % (ref 39.0–52.0)
Hemoglobin: 12.3 g/dL — ABNORMAL LOW (ref 13.0–17.0)
MCH: 25.6 pg — ABNORMAL LOW (ref 26.0–34.0)
MCHC: 29.4 g/dL — ABNORMAL LOW (ref 30.0–36.0)
MCV: 87.1 fL (ref 80.0–100.0)
Platelets: 433 K/uL — ABNORMAL HIGH (ref 150–400)
RBC: 4.81 MIL/uL (ref 4.22–5.81)
RDW: 14.8 % (ref 11.5–15.5)
WBC: 9.5 K/uL (ref 4.0–10.5)
nRBC: 0 % (ref 0.0–0.2)

## 2023-12-03 LAB — BRAIN NATRIURETIC PEPTIDE: B Natriuretic Peptide: 898 pg/mL — ABNORMAL HIGH (ref 0.0–100.0)

## 2023-12-03 LAB — TROPONIN I (HIGH SENSITIVITY): Troponin I (High Sensitivity): 10 ng/L (ref <18)

## 2023-12-03 MED ORDER — BUMETANIDE 0.25 MG/ML IJ SOLN
0.5000 mg | Freq: Once | INTRAMUSCULAR | Status: AC
  Administered 2023-12-03: 0.5 mg via INTRAVENOUS
  Filled 2023-12-03: qty 2

## 2023-12-03 MED ORDER — ENOXAPARIN SODIUM 40 MG/0.4ML IJ SOSY
40.0000 mg | PREFILLED_SYRINGE | INTRAMUSCULAR | Status: DC
  Administered 2023-12-04: 40 mg via SUBCUTANEOUS
  Filled 2023-12-03: qty 0.4

## 2023-12-03 MED ORDER — ACETAMINOPHEN 500 MG PO TABS
1000.0000 mg | ORAL_TABLET | Freq: Once | ORAL | Status: AC
  Administered 2023-12-03: 1000 mg via ORAL
  Filled 2023-12-03: qty 2

## 2023-12-03 MED ORDER — FUROSEMIDE 10 MG/ML IJ SOLN
40.0000 mg | Freq: Once | INTRAMUSCULAR | Status: DC
  Filled 2023-12-03: qty 4

## 2023-12-03 MED ORDER — BUMETANIDE 0.25 MG/ML IJ SOLN
1.5000 mg | Freq: Once | INTRAMUSCULAR | Status: AC
  Administered 2023-12-03: 1.5 mg via INTRAVENOUS
  Filled 2023-12-03: qty 6

## 2023-12-03 MED ORDER — SODIUM CHLORIDE 0.9% FLUSH
3.0000 mL | Freq: Two times a day (BID) | INTRAVENOUS | Status: DC
  Administered 2023-12-04 – 2023-12-08 (×8): 3 mL via INTRAVENOUS

## 2023-12-03 MED ORDER — ALBUTEROL SULFATE HFA 108 (90 BASE) MCG/ACT IN AERS: 2.0000 | INHALATION_SPRAY | RESPIRATORY_TRACT | Status: DC | PRN

## 2023-12-03 MED ORDER — LABETALOL HCL 5 MG/ML IV SOLN: 10.0000 mg | INTRAVENOUS | Status: DC | PRN

## 2023-12-03 MED ORDER — AMLODIPINE BESYLATE 5 MG PO TABS
5.0000 mg | ORAL_TABLET | Freq: Every day | ORAL | Status: DC
  Administered 2023-12-03 – 2023-12-05 (×3): 5 mg via ORAL
  Filled 2023-12-03 (×3): qty 1

## 2023-12-03 MED ORDER — IPRATROPIUM-ALBUTEROL 0.5-2.5 (3) MG/3ML IN SOLN
3.0000 mL | Freq: Once | RESPIRATORY_TRACT | Status: AC
  Administered 2023-12-03: 3 mL via RESPIRATORY_TRACT
  Filled 2023-12-03: qty 3

## 2023-12-03 MED ORDER — ENOXAPARIN SODIUM 40 MG/0.4ML IJ SOSY: 40.0000 mg | PREFILLED_SYRINGE | INTRAMUSCULAR | Status: DC

## 2023-12-03 MED ORDER — PANTOPRAZOLE SODIUM 40 MG PO TBEC
40.0000 mg | DELAYED_RELEASE_TABLET | Freq: Every day | ORAL | Status: DC
Start: 2023-12-04 — End: 2023-12-08
  Administered 2023-12-04 – 2023-12-08 (×5): 40 mg via ORAL
  Filled 2023-12-03 (×5): qty 1

## 2023-12-03 MED ORDER — ALBUTEROL SULFATE (2.5 MG/3ML) 0.083% IN NEBU
2.5000 mg | INHALATION_SOLUTION | RESPIRATORY_TRACT | Status: DC | PRN
  Administered 2023-12-05: 2.5 mg via RESPIRATORY_TRACT
  Filled 2023-12-03: qty 3

## 2023-12-03 MED ORDER — BUMETANIDE 0.25 MG/ML IJ SOLN
2.0000 mg | Freq: Two times a day (BID) | INTRAMUSCULAR | Status: DC
  Administered 2023-12-04 – 2023-12-07 (×6): 2 mg via INTRAVENOUS
  Filled 2023-12-03 (×8): qty 8

## 2023-12-03 MED ORDER — LORAZEPAM 2 MG/ML IJ SOLN
0.5000 mg | Freq: Once | INTRAMUSCULAR | Status: AC
  Administered 2023-12-03: 0.5 mg via INTRAVENOUS
  Filled 2023-12-03: qty 1

## 2023-12-03 NOTE — ED Notes (Signed)
 CCMD contacted for pt monitoring. Monitoring successful

## 2023-12-03 NOTE — ED Triage Notes (Signed)
 [facility] d/t SOB that started today. Increased respiratory effort noted during triage. Pt on 2L [place] at [facility]. Pt also c/o generalized chest tightness. Denies any radiation. Hx of CHF.

## 2023-12-03 NOTE — H&P (Signed)
 History and Physical    Alan Tapia. FMW:[id number] DOB: 30-Jul-1975 DOA: 12/03/2023  PCP: Patient, No Pcp Per   Patient coming from: Home   Chief Complaint:  Chief Complaint  Patient presents with   Shortness of Breath    HPI:  Alan Tapia. is a 48 y.o. male with hx of heart failure with combined systolic, diastolic dysfunction, chronic proteinuria and hypoalbuminemia, PAD, DM type 2, HTN, bilateral BKA, neuropathy, anemia, morbid obesity, who presents with progressive SOB.  Reports symptoms have been progressive over the past few weeks.  He is more comfortable laying with his left side down. + orthopnea. Diffuse edema. Appears significant issues with medication adherence, suspect social barriers contributing. Reports unable to get PCP appointment since march and office cancelling. So has not been able to fill meds since this time. Initially reports adherence with most medication, but when presented with fill hx acknowledges not taking as Rx'd. Reportedly still taking Spironolactone , but last filled last year. Denies any associated chest pain. Denies other recent illness.    Review of Systems:  ROS complete and negative except as marked above   Allergies  Allergen Reactions   Fish Allergy Anaphylaxis, Hives and Other (See Comments)   Lasix  [Furosemide ] Hives   Benadryl  [Diphenhydramine ] Swelling    Pt reports cannot take pill form of benadryl    Cefepime  Other (See Comments)    Redness of the neck and cheek.  Concern for rash   Coreg  [Carvedilol ] Nausea Only   Fish-Derived Products    Vancomycin  Hives    Redman's-type rash on 08/13/22    Prior to Admission medications   Medication Sig Start Date End Date Taking? Authorizing Provider  acetaminophen  (TYLENOL ) 500 MG tablet Take 2 tablets (1,000 mg total) by mouth every 8 (eight) hours as needed for mild pain (pain score 1-3), fever or headache (or temp > 100.5). 06/29/23   Raenelle Coria, MD  amLODipine  (NORVASC ) 5 MG  tablet Take 1 tablet (5 mg total) by mouth daily. 06/30/23   Raenelle Coria, MD  diclofenac  Sodium (VOLTAREN ) 1 % GEL Apply 2 g topically 4 (four) times daily as needed (areas of pain.). 06/29/23   Raenelle Coria, MD  hydrOXYzine  (ATARAX ) 25 MG tablet Take 1 tablet (25 mg total) by mouth 3 (three) times daily as needed for itching or anxiety. 06/29/23   Raenelle Coria, MD  lidocaine  (LIDODERM ) 5 % Place 2 patches onto the skin daily. Remove & Discard patch within 12 hours or as directed by MD 06/29/23   Raenelle Coria, MD  melatonin 5 MG TABS Take 1 tablet (5 mg total) by mouth at bedtime. Patient not taking: Reported on 04/07/2023 09/09/22   Love, Sharlet RAMAN, PA-C  metoprolol  succinate (TOPROL -XL) 200 MG 24 hr tablet Take 1 tablet (200 mg total) by mouth daily. Take with or immediately following a meal. 06/30/23 07/30/23  Raenelle Coria, MD  ondansetron  (ZOFRAN ) 4 MG tablet Take 1 tablet (4 mg total) by mouth every 8 (eight) hours as needed for nausea or vomiting. 04/11/23 04/10/24  Vasireddy, Padmaja, MD  pantoprazole  (PROTONIX ) 40 MG tablet Take 1 tablet (40 mg total) by mouth 2 (two) times daily. 06/29/23   Ghimire, Kuber, MD  polyethylene glycol (MIRALAX  / GLYCOLAX ) 17 g packet Take 17 g by mouth 2 (two) times daily. 06/29/23   Raenelle Coria, MD  sacubitril -valsartan  (ENTRESTO ) 97-103 MG Take 1 tablet by mouth 2 (two) times daily. 06/29/23   Raenelle Coria, MD  simethicone  (MYLICON) 80 MG chewable tablet  Chew 1 tablet (80 mg total) by mouth 4 (four) times daily as needed for flatulence. 06/29/23   Raenelle Coria, MD    Past Medical History:  Diagnosis Date   Hypertension    Uncontrolled type 2 diabetes mellitus with hyperglycemia, with long-term current use of insulin  (HCC) 06/06/2022    Past Surgical History:  Procedure Laterality Date   AMPUTATION Left 08/15/2022   Procedure: LEFT BELOW KNEE AMPUTATION;  Surgeon: Harden Jerona GAILS, MD;  Location: [facility] OR;  Service: Orthopedics;  Laterality: Left;    AMPUTATION Right 04/08/2023   Procedure: RIGHT BELOW KNEE AMPUTATION;  Surgeon: Harden Jerona GAILS, MD;  Location: [facility] OR;  Service: Orthopedics;  Laterality: Right;     reports that he has never smoked. He has never used smokeless tobacco. He reports that he does not drink alcohol and does not use drugs.  Family History  Problem Relation Age of Onset   Arthritis Mother    Allergies Father    Hypertension Father    Diabetes Father      Physical Exam: Vitals:   12/03/23 1953 12/03/23 2012 12/03/23 2100 12/03/23 2130  BP:  (!) 185/123 (!) 173/90 (!) 188/102  Pulse:   95 (!) 108  Temp:      TempSrc:      SpO2:   100% 95%  Height: 6' 4 (1.93 m)       Gen: Awake, alert, chronically and acutely ill appearing,   CV: Regular, normal S1, S2, no murmurs  Resp: In mild resp distress, tachypneic, laying with left side down. On [place]. Diminished throughout the L and in R base.  Abd: Morbidly obese, normoactive, nontender MSK: There is diffuse anasarca, 2+, worse in dependent / Left side  Skin: No rashes or lesions to exposed skin  Neuro: Alert and interactive  Psych: affect sad, appropriate    Data review:   Labs reviewed, notable for:  K 5.2, Cr 1.6 (b/l 0.8)  BNP 898, HS trop neg UA protein > 300 mg/dl, Albumin  1.8 (chronically low)  Blood counts unremarkable   Micro:  Results for orders placed or performed during the hospital encounter of 04/06/23  Culture, blood (Routine x 2)     Status: None   Collection Time: 04/06/23 10:20 PM   Specimen: BLOOD  Result Value Ref Range Status   Specimen Description   Final    BLOOD LEFT ANTECUBITAL Performed at [facility], [address] [place], [name] [address]    Special Requests   Final    BOTTLES DRAWN AEROBIC AND ANAEROBIC Blood Culture adequate volume Performed at [facility], [address] [place], [name] [address]    Culture   Final    NO GROWTH 5 DAYS Performed at [facility], [address], [place], [name] [address]    Report Status 04/11/2023 FINAL  Final  Culture, blood (Routine x 2)     Status: None   Collection Time: 04/06/23 10:32 PM   Specimen: BLOOD  Result Value Ref Range Status   Specimen Description   Final    BLOOD BLOOD LEFT FOREARM Performed at [facility], [address], [place], [name] [address]    Special Requests   Final    BOTTLES DRAWN AEROBIC AND ANAEROBIC Blood Culture results may not be optimal due to an inadequate volume of blood received in culture bottles Performed at [facility], [address] [place], [name] [address]  Culture   Final    NO GROWTH 5 DAYS Performed at [facility], [address] [place], [name] [address]    Report Status 04/11/2023 FINAL  Final  Respiratory (~20 pathogens) panel by PCR     Status: None   Collection Time: 04/07/23  1:04 AM   Specimen: Nasopharyngeal Swab; Respiratory  Result Value Ref Range Status   Adenovirus NOT DETECTED NOT DETECTED Final   Coronavirus 229E NOT DETECTED NOT DETECTED Final    Comment: (NOTE) The Coronavirus on the Respiratory Panel, DOES NOT test for the novel  Coronavirus (2019 nCoV)    Coronavirus HKU1 NOT DETECTED NOT DETECTED Final   Coronavirus NL63 NOT DETECTED NOT DETECTED Final   Coronavirus OC43 NOT DETECTED NOT DETECTED Final   Metapneumovirus NOT DETECTED NOT DETECTED Final   Rhinovirus / Enterovirus NOT DETECTED NOT DETECTED Final   Influenza A NOT DETECTED NOT DETECTED Final   Influenza B NOT DETECTED NOT DETECTED Final   Parainfluenza Virus 1 NOT DETECTED NOT DETECTED Final   Parainfluenza Virus 2 NOT DETECTED NOT DETECTED Final   Parainfluenza Virus 3 NOT DETECTED NOT DETECTED Final   Parainfluenza Virus 4 NOT DETECTED NOT DETECTED Final   Respiratory Syncytial Virus NOT DETECTED NOT DETECTED Final   Bordetella pertussis NOT DETECTED NOT DETECTED Final   Bordetella Parapertussis NOT DETECTED NOT  DETECTED Final   Chlamydophila pneumoniae NOT DETECTED NOT DETECTED Final   Mycoplasma pneumoniae NOT DETECTED NOT DETECTED Final    Comment: Performed at [facility], [address], [place], [name] [address]  Culture, blood (routine x 2) Call MD if unable to obtain prior to antibiotics being given     Status: None   Collection Time: 04/07/23  5:40 AM   Specimen: BLOOD RIGHT ARM  Result Value Ref Range Status   Specimen Description   Final    BLOOD RIGHT ARM Performed at [facility], [address], [place], [name] [address]    Special Requests   Final    BOTTLES DRAWN AEROBIC ONLY Blood Culture adequate volume Performed at [facility], [address], [place], [name] [address]    Culture   Final    NO GROWTH 5 DAYS Performed at [facility], [address] [place], [name] [address]    Report Status 04/12/2023 FINAL  Final  Culture, blood (routine x 2) Call MD if unable to obtain prior to antibiotics being given     Status: None   Collection Time: 04/07/23  5:40 AM   Specimen: BLOOD RIGHT HAND  Result Value Ref Range Status   Specimen Description   Final    BLOOD RIGHT HAND Performed at [facility], [address], [place], [name] [address]    Special Requests   Final    BOTTLES DRAWN AEROBIC ONLY Blood Culture adequate volume Performed at [facility], [address] [place], [name] [address]    Culture   Final    NO GROWTH 5 DAYS Performed at [facility], [address], [place], [name] [address]    Report Status 04/12/2023 FINAL  Final  Surgical pcr screen     Status: None   Collection Time: 04/08/23  2:10 AM   Specimen: Nasal Mucosa; Nasal Swab  Result Value Ref Range Status   MRSA, PCR NEGATIVE NEGATIVE Final   Staphylococcus aureus NEGATIVE NEGATIVE Final    Comment: (NOTE) The Xpert SA Assay (  FDA approved for NASAL specimens in patients 71 years of age and older), is one component  of a comprehensive surveillance program. It is not intended to diagnose infection nor to guide or monitor treatment. Performed at [facility], [address] [place], [name] [address]   SARS Coronavirus 2 by RT PCR (hospital order, performed in [facility] hospital lab) *cepheid single result test* Anterior Nasal Swab     Status: Abnormal   Collection Time: 04/30/23  1:10 PM   Specimen: Anterior Nasal Swab  Result Value Ref Range Status   SARS Coronavirus 2 by RT PCR POSITIVE (A) NEGATIVE Final    Comment: Performed at [facility], [address] [place], [name] [address]    Imaging reviewed:  DG Chest 1 View Result Date: 12/03/2023 EXAM: 1 VIEW XRAY OF THE CHEST 12/03/2023 09:31:46 PM COMPARISON: 06/19/2023 CLINICAL HISTORY: shob. Best possible pt unable to lay flat and techs had to hold.had to use extreme angles FINDINGS: LUNGS AND PLEURA: Moderate interstitial edema. Large left pleural effusion, increased. HEART AND MEDIASTINUM: No acute abnormality of the cardiac and mediastinal silhouettes. BONES AND SOFT TISSUES: No acute osseous abnormality. IMPRESSION: 1. Moderate interstitial edema. 2. Large left pleural effusion, increased. Electronically signed by: Pinkie Pebbles MD 12/03/2023 09:38 PM EDT RP Workstation: [id number]    EKG:  Personally reviewed,  SR, low voltage, PRWP, no acute ischemic changes.   ED Course:  Treated with Bumex  0.5 mg IV, nebs, ativan .     Assessment/Plan:  48 y.o. male with hx  heart failure with combined systolic, diastolic dysfunction, chronic hypoxic respiratory failure on 2L O2, chronic proteinuria and hypoalbuminemia, PAD, DM type 2, HTN, bilateral BKA, neuropathy, anemia, morbid obesity, who presents with progressive SOB. Found to have fluid overload, anasarca likely combined HF and hypoalbuminemia contributing. And symptomatic L pleural effusion.   Volume overload, Anasarca  Symptomatic Large L pleural effusion   Heart failure  with combined systolic, diastolic failure with acute exacerbation  Last TTE 1/'25 with LVEF 30-35%, global hypokinesis with LV dilation, grade 2 diastolic dysfunction, mild MR, mild LA dilation. HX progressive SOB x weeks, + orthopnea, + trepopnea / laying with L side down. On presentation tachypneic in the 30s, mild resp distress, normal sat on 4L O2, tachycardic in low 100s, Hypertensive in 180s systolic. Exam with diffuse anasarca. BNP 898, HS trop neg. See below re: proteinuria. CXR with moderate interstitial edema and large L pleural effusion. Reports allergy to lasix  (putting fluid in different pockets, wife says causes hives) but appears has tolerated this in the past. Not clear if true allergy, has tolerated Bumex  given in ED thus far.  -- Due to degree of his respiratory compromise related to effusion, will have IR eval for therapeutic thoracentesis in AM, ordered. NPO after MN in case of procedure -- For now will plan to continue Bumex , got small dose 0.5 mg IV in ED, given additional 1.5 IV x 1.  -- Tentatively reordered for 2 mg IV BID  -- If continues to have high WOB despite initial diuresis may benefit from BiPAP overnight  -- Repeat echo  -- GDMT will need to be reassessed; for now held with his renal insufficiency, hyperkalemia, and decompensated HF.  - Heart failure navigator/TOC consult - Low-sodium diet, I/O, daily weights.   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 UA otherwise bland. Has AKI per above but suspect  cardiorenal. I suspect may have advanced hypertensive/diabetic nephropathy and proteinuria related. However, will need to assess degree of proteinuria and possibly involve nephrology.   -- Check UPCR,  if elevated in nephrotic range would involve nephrology  -- Check TSH   Acute kidney injury stage II  Mild Hyperkalemia  Baseline Cr near 0.8, elevated to 1.6 on  admission. K 5.2. Suspect cardiorenal + NSAIDS  -- Diuresis per above.  -- Needs additional counseling to avoid NSAIDS (takes daily)  -- Check PVR  -- K elimination with diuresis, low K diet   Hypertensive urgency  SBP 180-190s in the ED.  -- Restart Amlodipine  5 mg daily, first dose tonight.  -- In future would prefer ARB/ARNI as second agent once renal function improves.  -- Labetalol  10 mg IV q4 hr prn for SBP > 180.   Social determinants  Suspect poor health literacy, poor insight into medical conditions + treatment. Most meds last filled in 3/'25  -- TOC involvement re: barriers to medication adherence.  -- HF navigator per above  -- Will need expedited primary care f/u after this admission if we can help arrange. Has not been seen in person since 2/'25.   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  PAD: Noted, not on aspirin / cholesterol lowering agents. Likely benefit from both  DM type 2: Check A1c, currently not requiring SSI will monitor  Bilateral BKA: Hx OM, prolonged hospitalization 11/'24 - 2/'25 and underwent R BKA with Dr. Harden.  Neuropathy: Noted, not on medication.  Morbid obesity: Doubt BMI accurate; will benefit from weight loss outpatient.   Body mass index is 34.4 kg/m.   DVT prophylaxis:  Lovenox ; Ordered to start tomorrow evening (in case of thora in AM)  Code Status:  Full Code Diet:  Diet Orders (From admission, onward)     Start     Ordered   12/04/23 0001  Diet NPO time specified Except for: Sips with Meds  Diet effective midnight       Question:  Except for  Answer:  Sips with Meds   12/03/23 2250   12/03/23 2226  Diet 2 gram sodium Room service appropriate? Yes; Fluid consistency: Thin  Diet effective now       Comments: Low potassium  Question Answer Comment  Room service appropriate? Yes   Fluid consistency: Thin      12/03/23 2226           Family  Communication:  Yes discussed with wife at bedside   Consults:  None   Admission status:   Inpatient, Step Down Unit  Severity of Illness: The appropriate patient status for this patient is INPATIENT. Inpatient status is judged to be reasonable and necessary in order to provide the required intensity of service to ensure the patient's safety. The patient's presenting symptoms, physical exam findings, and initial radiographic and laboratory data in the context of their chronic comorbidities is felt to place them at high risk for further clinical deterioration. Furthermore, it is not anticipated that the patient will be medically stable for discharge from the hospital within 2 midnights of admission.   * I certify that at the point of admission it is my clinical judgment that the patient will require inpatient hospital care spanning beyond 2 midnights from the point of admission due to high intensity of service, high risk for further deterioration and high frequency of surveillance required.*   Alan Dawson,  MD [facility]  How to contact the [facility] Attending or Consulting provider 7A - 7P or covering provider during after hours 7P -7A, for this patient.  Check the care team in [facility] and look for a) attending/consulting [facility] provider listed and b) the [facility] team listed Log into [email or web address] and use [place]'s universal password to access. If you do not have the password, please contact the hospital operator. Locate the [facility] provider you are looking for under [facility] and page to a number that you can be directly reached. If you still have difficulty reaching the provider, please page the [facility] (Director on Call) for the Hospitalists listed on amion for assistance.  12/03/2023, 10:58 PM

## 2023-12-03 NOTE — ED Provider Notes (Signed)
 [facility] Provider Note  CSN: [id number] Arrival date & time: 12/03/23 1936  Chief Complaint(s) Shortness of Breath  HPI Alan Tapia. is a 48 y.o. male history of diabetes, bilateral BKA, CHF, CKD, peripheral vascular disease presenting to the emergency department with shortness of breath.  Patient reports shortness of breath increasing today.  Also reports swelling to the legs and abdomen.  States he is not taking any medication because he did not like taking Lasix  and he ran out of spironolactone .  He reports shortness of breath worsens lying flat.  Denies any cough.  Reports some chest tightness as well.  No headaches.  No syncope.  No abdominal pain.   Past Medical History Past Medical History:  Diagnosis Date   Hypertension    Uncontrolled type 2 diabetes mellitus with hyperglycemia, with long-term current use of insulin  (HCC) 06/06/2022   Patient Active Problem List   Diagnosis Date Noted   Acute exacerbation of CHF (congestive heart failure) (HCC) 12/03/2023   Chronic pain 05/19/2023   Obesity, Class II, BMI 35-39.9 05/11/2023   COVID-19 virus infection 05/01/2023   Pressure injury of skin 05/01/2023   Malnutrition of moderate degree 04/14/2023   Subacute osteomyelitis of right foot (HCC) 04/08/2023   Cutaneous abscess of right foot 04/08/2023   S/P BKA (below knee amputation), right [facility]) - 04-08-2023 04/08/2023   Acute osteomyelitis of right foot (HCC) 04/07/2023   Acute on chronic anemia 04/07/2023   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 04/07/2023   Insulin  dependent type 2 diabetes mellitus (HCC) 04/07/2023   CKD (chronic kidney disease), stage II 04/07/2023   Hx of left BKA (HCC) 04/07/2023   PVD (peripheral vascular disease) (HCC) 04/07/2023   Constipation 09/11/2022   Type 2 diabetes mellitus with hyperglycemia, with long-term current use of insulin  (HCC) 09/09/2022    Depressed left ventricular ejection fraction 09/05/2022   Phantom pain 09/02/2022   Adjustment disorder 08/29/2022   Essential hypertension 08/27/2022   Dilated cardiomyopathy (HCC) 08/26/2022   Below-knee amputation of left lower extremity (HCC) 08/22/2022   Sepsis (HCC) 08/15/2022   Uncontrolled type 2 diabetes mellitus with hyperglycemia, with long-term current use of insulin  (HCC) 06/06/2022   Home Medication(s) Prior to Admission medications   Medication Sig Start Date End Date Taking? Authorizing Provider  acetaminophen  (TYLENOL ) 500 MG tablet Take 2 tablets (1,000 mg total) by mouth every 8 (eight) hours as needed for mild pain (pain score 1-3), fever or headache (or temp > 100.5). 06/29/23   Raenelle Coria, MD  amLODipine  (NORVASC ) 5 MG tablet Take 1 tablet (5 mg total) by mouth daily. 06/30/23   Raenelle Coria, MD  diclofenac  Sodium (VOLTAREN ) 1 % GEL Apply 2 g topically 4 (four) times daily as needed (areas of pain.). 06/29/23   Ghimire, Kuber, MD  hydrOXYzine  (ATARAX ) 25 MG tablet Take 1 tablet (25 mg total) by mouth 3 (three) times daily as needed for itching or anxiety. 06/29/23   Raenelle Coria, MD  lidocaine  (LIDODERM ) 5 % Place 2 patches onto the skin daily. Remove & Discard patch within 12 hours or as directed by MD 06/29/23   Raenelle Coria, MD  melatonin 5 MG TABS Take 1 tablet (5 mg total) by mouth at bedtime. Patient not taking: Reported on 04/07/2023 09/09/22   Love, Sharlet RAMAN, PA-C  metoprolol  succinate (TOPROL -XL) 200 MG 24 hr tablet Take 1 tablet (200 mg total) by mouth daily. Take  with or immediately following a meal. 06/30/23 07/30/23  Raenelle Coria, MD  ondansetron  (ZOFRAN ) 4 MG tablet Take 1 tablet (4 mg total) by mouth every 8 (eight) hours as needed for nausea or vomiting. 04/11/23 04/10/24  Vasireddy, Padmaja, MD  pantoprazole  (PROTONIX ) 40 MG tablet Take 1 tablet (40 mg total) by mouth 2 (two) times daily. 06/29/23   Ghimire, Kuber, MD  polyethylene glycol (MIRALAX  /  GLYCOLAX ) 17 g packet Take 17 g by mouth 2 (two) times daily. 06/29/23   Raenelle Coria, MD  sacubitril -valsartan  (ENTRESTO ) 97-103 MG Take 1 tablet by mouth 2 (two) times daily. 06/29/23   Raenelle Coria, MD  simethicone  (MYLICON) 80 MG chewable tablet Chew 1 tablet (80 mg total) by mouth 4 (four) times daily as needed for flatulence. 06/29/23   Raenelle Coria, MD                                                                                                                                    Past Surgical History Past Surgical History:  Procedure Laterality Date   AMPUTATION Left 08/15/2022   Procedure: LEFT BELOW KNEE AMPUTATION;  Surgeon: Harden Jerona GAILS, MD;  Location: [facility] OR;  Service: Orthopedics;  Laterality: Left;   AMPUTATION Right 04/08/2023   Procedure: RIGHT BELOW KNEE AMPUTATION;  Surgeon: Harden Jerona GAILS, MD;  Location: [facility] OR;  Service: Orthopedics;  Laterality: Right;   Family History Family History  Problem Relation Age of Onset   Arthritis Mother    Allergies Father    Hypertension Father    Diabetes Father     Social History Social History   Tobacco Use   Smoking status: Never   Smokeless tobacco: Never  Vaping Use   Vaping status: Never Used  Substance Use Topics   Alcohol use: No   Drug use: No   Allergies Fish allergy, Benadryl  [diphenhydramine ], Cefepime , Coreg  [carvedilol ], Fish-derived products, and Vancomycin   Review of Systems Review of Systems  All other systems reviewed and are negative.   Physical Exam Vital Signs  I have reviewed the triage vital signs BP (!) 188/102   Pulse (!) 108   Temp 98 F (36.7 C) (Oral)   Ht 6' 4 (1.93 m)   SpO2 95%   BMI 34.40 kg/m  Physical Exam Vitals and nursing note reviewed.  Constitutional:      General: He is in acute distress.     Appearance: Normal appearance.  HENT:     Mouth/Throat:     Mouth: Mucous membranes are moist.  Eyes:     Conjunctiva/sclera: Conjunctivae normal.  Cardiovascular:      Rate and Rhythm: Normal rate and regular rhythm.  Pulmonary:     Effort: Tachypnea and accessory muscle usage present.     Comments: Distant breath sounds due to obesity Abdominal:     General: Abdomen is flat.     Palpations: Abdomen is soft.  Tenderness: There is no abdominal tenderness.  Musculoskeletal:     Right lower leg: Edema present.     Left lower leg: Edema present.     Comments: Bilateral BKA with 2-3+ pitting edema up to the lower abdomen  Skin:    General: Skin is warm and dry.     Capillary Refill: Capillary refill takes less than 2 seconds.  Neurological:     Mental Status: He is alert and oriented to person, place, and time. Mental status is at baseline.  Psychiatric:        Mood and Affect: Mood normal.        Behavior: Behavior normal.     ED Results and Treatments Labs (all labs ordered are listed, but only abnormal results are displayed) Labs Reviewed  CBC - Abnormal; Notable for the following components:      Result Value   Hemoglobin 12.3 (*)    MCH 25.6 (*)    MCHC 29.4 (*)    Platelets 433 (*)    All other components within normal limits  BRAIN NATRIURETIC PEPTIDE - Abnormal; Notable for the following components:   B Natriuretic Peptide 898.0 (*)    All other components within normal limits  COMPREHENSIVE METABOLIC PANEL WITH GFR - Abnormal; Notable for the following components:   Potassium 5.2 (*)    BUN 24 (*)    Creatinine, Ser 1.61 (*)    Calcium 8.0 (*)    Total Protein 6.1 (*)    Albumin  1.8 (*)    Alkaline Phosphatase 139 (*)    GFR, Estimated 52 (*)    All other components within normal limits  URINALYSIS, W/ REFLEX TO CULTURE (INFECTION SUSPECTED) - Abnormal; Notable for the following components:   Glucose, UA 50 (*)    Protein, ur >=300 (*)    All other components within normal limits  HIV ANTIBODY (ROUTINE TESTING W REFLEX)  BASIC METABOLIC PANEL WITH GFR  CBC  MAGNESIUM   PHOSPHORUS  TSH  HEMOGLOBIN A1C  LIPID PANEL   PROTEIN / CREATININE RATIO, URINE  RAPID URINE DRUG SCREEN, HOSP PERFORMED  TROPONIN I (HIGH SENSITIVITY)  TROPONIN I (HIGH SENSITIVITY)                                                                                                                          Radiology DG Chest 1 View Result Date: 12/03/2023 EXAM: 1 VIEW XRAY OF THE CHEST 12/03/2023 09:31:46 PM COMPARISON: 06/19/2023 CLINICAL HISTORY: shob. Best possible pt unable to lay flat and techs had to hold.had to use extreme angles FINDINGS: LUNGS AND PLEURA: Moderate interstitial edema. Large left pleural effusion, increased. HEART AND MEDIASTINUM: No acute abnormality of the cardiac and mediastinal silhouettes. BONES AND SOFT TISSUES: No acute osseous abnormality. IMPRESSION: 1. Moderate interstitial edema. 2. Large left pleural effusion, increased. Electronically signed by: Pinkie Pebbles MD 12/03/2023 09:38 PM EDT RP Workstation: [id number]    Pertinent labs & imaging results that were available during my  care of the patient were reviewed by me and considered in my medical decision making (see MDM for details).  Medications Ordered in ED Medications  albuterol  (VENTOLIN  HFA) 108 (90 Base) MCG/ACT inhaler 2 puff (has no administration in time range)  bumetanide  (BUMEX ) injection 1.5 mg (has no administration in time range)  acetaminophen  (TYLENOL ) tablet 1,000 mg (has no administration in time range)  enoxaparin  (LOVENOX ) injection 40 mg (has no administration in time range)  sodium chloride  flush (NS) 0.9 % injection 3 mL (has no administration in time range)  ipratropium-albuterol  (DUONEB) 0.5-2.5 (3) MG/3ML nebulizer solution 3 mL (3 mLs Nebulization Given 12/03/23 2000)  LORazepam  (ATIVAN ) injection 0.5 mg (0.5 mg Intravenous Given 12/03/23 2044)  bumetanide  (BUMEX ) injection 0.5 mg (0.5 mg Intravenous Given 12/03/23 2130)                                                                                                                                      Procedures Procedures  (including critical care time)  Medical Decision Making / ED Course   MDM:  48 year old presenting to the emergency department shortness of breath.  Patient with mild respiratory distress, tachypnea.  Suspect likely due to acute CHF.  He is extremely edematous and anasarcic on exam.  He has history of CHF.  Chest x-ray shows pleural effusion and interstitial edema.  His BNP is elevated.  Troponin is normal despite reported chest pain.  Differential also includes pneumonia but no cough or leukocytosis.  Chest x-ray with no pneumothorax.  Does have abdominal wall edema but no abdominal tenderness to suggest any intra-abdominal process.  He is not compliant with his diuretic which is likely cause of his CHF exacerbation.  He was not willing to take Lasix  but was willing to take Bumex .  Given his significant volume overload, suspect patient will need admission.  Discussed with hospitalist who will admit the patient for further management of his CHF      Additional history obtained: -Additional history obtained from ems -External records from outside source obtained and reviewed including: Chart review including previous notes, labs, imaging, consultation notes including prior notes    Lab Tests: -I ordered, reviewed, and interpreted labs.   The pertinent results include:   Labs Reviewed  CBC - Abnormal; Notable for the following components:      Result Value   Hemoglobin 12.3 (*)    MCH 25.6 (*)    MCHC 29.4 (*)    Platelets 433 (*)    All other components within normal limits  BRAIN NATRIURETIC PEPTIDE - Abnormal; Notable for the following components:   B Natriuretic Peptide 898.0 (*)    All other components within normal limits  COMPREHENSIVE METABOLIC PANEL WITH GFR - Abnormal; Notable for the following components:   Potassium 5.2 (*)    BUN 24 (*)    Creatinine, Ser 1.61 (*)    Calcium 8.0 (*)  Total Protein 6.1 (*)     Albumin  1.8 (*)    Alkaline Phosphatase 139 (*)    GFR, Estimated 52 (*)    All other components within normal limits  URINALYSIS, W/ REFLEX TO CULTURE (INFECTION SUSPECTED) - Abnormal; Notable for the following components:   Glucose, UA 50 (*)    Protein, ur >=300 (*)    All other components within normal limits  HIV ANTIBODY (ROUTINE TESTING W REFLEX)  BASIC METABOLIC PANEL WITH GFR  CBC  MAGNESIUM   PHOSPHORUS  TSH  HEMOGLOBIN A1C  LIPID PANEL  PROTEIN / CREATININE RATIO, URINE  RAPID URINE DRUG SCREEN, HOSP PERFORMED  TROPONIN I (HIGH SENSITIVITY)  TROPONIN I (HIGH SENSITIVITY)    Notable for elevated BNP ,AKI likely cardiorenal   EKG   EKG Interpretation Date/Time:  Thursday December 03 2023 20:00:15 EDT Ventricular Rate:  103 PR Interval:    QRS Duration:  94 QT Interval:  363 QTC Calculation: 476 R Axis:   -53  Text Interpretation: Sinus tachycardia Left anterior fascicular block Low voltage, extremity and precordial leads Borderline prolonged QT interval Confirmed by Francesca Fallow ([id number]) on 12/03/2023 8:13:02 PM         Imaging Studies ordered: I ordered imaging studies including CXR On my interpretation imaging demonstrates CHF I independently visualized and interpreted imaging. I agree with the radiologist interpretation   Medicines ordered and prescription drug management: Meds ordered this encounter  Medications   albuterol  (VENTOLIN  HFA) 108 (90 Base) MCG/ACT inhaler 2 puff   ipratropium-albuterol  (DUONEB) 0.5-2.5 (3) MG/3ML nebulizer solution 3 mL   DISCONTD: furosemide  (LASIX ) injection 40 mg   LORazepam  (ATIVAN ) injection 0.5 mg   bumetanide  (BUMEX ) injection 0.5 mg   bumetanide  (BUMEX ) injection 1.5 mg   acetaminophen  (TYLENOL ) tablet 1,000 mg   enoxaparin  (LOVENOX ) injection 40 mg   sodium chloride  flush (NS) 0.9 % injection 3 mL    -I have reviewed the patients home medicines and have made adjustments as needed   Consultations  Obtained: I requested consultation with the hospitalist,  and discussed lab and imaging findings as well as pertinent plan - they recommend: admission   Cardiac Monitoring: The patient was maintained on a cardiac monitor.  I personally viewed and interpreted the cardiac monitored which showed an underlying rhythm of: NSR  Social Determinants of Health:  Diagnosis or treatment significantly limited by social determinants of health: obesity   Reevaluation: After the interventions noted above, I reevaluated the patient and found that their symptoms have improved  Co morbidities that complicate the patient evaluation  Past Medical History:  Diagnosis Date   Hypertension    Uncontrolled type 2 diabetes mellitus with hyperglycemia, with long-term current use of insulin  (HCC) 06/06/2022      Dispostion: Disposition decision including need for hospitalization was considered, and patient admitted to the hospital.    Final Clinical Impression(s) / ED Diagnoses Final diagnoses:  Acute systolic congestive heart failure (HCC)     This chart was dictated using voice recognition software.  Despite best efforts to proofread,  errors can occur which can change the documentation meaning.    Francesca Fallow CROME, MD 12/03/23 2229

## 2023-12-04 ENCOUNTER — Inpatient Hospital Stay (HOSPITAL_COMMUNITY)

## 2023-12-04 DIAGNOSIS — I5033 Acute on chronic diastolic (congestive) heart failure: Secondary | ICD-10-CM

## 2023-12-04 DIAGNOSIS — R601 Generalized edema: Secondary | ICD-10-CM | POA: Diagnosis not present

## 2023-12-04 DIAGNOSIS — I5043 Acute on chronic combined systolic (congestive) and diastolic (congestive) heart failure: Secondary | ICD-10-CM | POA: Diagnosis not present

## 2023-12-04 LAB — RAPID URINE DRUG SCREEN, HOSP PERFORMED
Amphetamines: NOT DETECTED
Barbiturates: NOT DETECTED
Benzodiazepines: NOT DETECTED
Cocaine: NOT DETECTED
Opiates: NOT DETECTED
Tetrahydrocannabinol: NOT DETECTED

## 2023-12-04 LAB — BODY FLUID CELL COUNT WITH DIFFERENTIAL
Eos, Fluid: 0 %
Lymphs, Fluid: 70 %
Monocyte-Macrophage-Serous Fluid: 27 % — ABNORMAL LOW (ref 50–90)
Neutrophil Count, Fluid: 3 % (ref 0–25)
Total Nucleated Cell Count, Fluid: 85 uL (ref 0–1000)

## 2023-12-04 LAB — LACTATE DEHYDROGENASE, PLEURAL OR PERITONEAL FLUID: LD, Fluid: 27 U/L — ABNORMAL HIGH (ref 3–23)

## 2023-12-04 LAB — LIPID PANEL
Cholesterol: 234 mg/dL — ABNORMAL HIGH (ref 0–200)
HDL: 71 mg/dL (ref >40)
LDL Cholesterol: 148 mg/dL — ABNORMAL HIGH (ref 0–99)
Total CHOL/HDL Ratio: 3.3 ratio
Triglycerides: 77 mg/dL (ref <150)
VLDL: 15 mg/dL (ref 0–40)

## 2023-12-04 LAB — CBC
HCT: 41.7 % (ref 39.0–52.0)
Hemoglobin: 12.6 g/dL — ABNORMAL LOW (ref 13.0–17.0)
MCH: 25.9 pg — ABNORMAL LOW (ref 26.0–34.0)
MCHC: 30.2 g/dL (ref 30.0–36.0)
MCV: 85.6 fL (ref 80.0–100.0)
Platelets: 490 K/uL — ABNORMAL HIGH (ref 150–400)
RBC: 4.87 MIL/uL (ref 4.22–5.81)
RDW: 14.9 % (ref 11.5–15.5)
WBC: 10.1 K/uL (ref 4.0–10.5)
nRBC: 0 % (ref 0.0–0.2)

## 2023-12-04 LAB — TSH: TSH: 5.02 u[IU]/mL — ABNORMAL HIGH (ref 0.350–4.500)

## 2023-12-04 LAB — ECHOCARDIOGRAM COMPLETE
Area-P 1/2: 4.26 cm2
Calc EF: 43.3 %
Height: 76 in
S' Lateral: 4.7 cm
Single Plane A2C EF: 37 %
Single Plane A4C EF: 48.6 %

## 2023-12-04 LAB — GLUCOSE, PLEURAL OR PERITONEAL FLUID: Glucose, Fluid: 101 mg/dL

## 2023-12-04 LAB — MAGNESIUM: Magnesium: 2.2 mg/dL (ref 1.7–2.4)

## 2023-12-04 LAB — PROTEIN / CREATININE RATIO, URINE
Creatinine, Urine: 63 mg/dL
Protein Creatinine Ratio: 16.35 mg/mg{creat} — ABNORMAL HIGH (ref 0.00–0.15)
Total Protein, Urine: 1030 mg/dL

## 2023-12-04 LAB — HEMOGLOBIN A1C
Hgb A1c MFr Bld: 4.6 % — ABNORMAL LOW (ref 4.8–5.6)
Mean Plasma Glucose: 85.32 mg/dL

## 2023-12-04 LAB — HIV ANTIBODY (ROUTINE TESTING W REFLEX): HIV Screen 4th Generation wRfx: NONREACTIVE

## 2023-12-04 LAB — PROTEIN, PLEURAL OR PERITONEAL FLUID: Total protein, fluid: <3 g/dL

## 2023-12-04 LAB — PHOSPHORUS: Phosphorus: 4.1 mg/dL (ref 2.5–4.6)

## 2023-12-04 LAB — TROPONIN I (HIGH SENSITIVITY): Troponin I (High Sensitivity): 11 ng/L (ref <18)

## 2023-12-04 MED ORDER — ACETAMINOPHEN 325 MG PO TABS
650.0000 mg | ORAL_TABLET | Freq: Four times a day (QID) | ORAL | Status: DC | PRN
  Administered 2023-12-05 – 2023-12-07 (×2): 650 mg via ORAL
  Filled 2023-12-04 (×2): qty 2

## 2023-12-04 MED ORDER — MELATONIN 5 MG PO TABS
10.0000 mg | ORAL_TABLET | Freq: Every evening | ORAL | Status: DC | PRN
  Administered 2023-12-07: 10 mg via ORAL
  Filled 2023-12-04: qty 2

## 2023-12-04 MED ORDER — CLONIDINE HCL 0.1 MG PO TABS
0.1000 mg | ORAL_TABLET | Freq: Two times a day (BID) | ORAL | Status: DC
Start: 2023-12-04 — End: 2023-12-07
  Administered 2023-12-04 – 2023-12-07 (×6): 0.1 mg via ORAL
  Filled 2023-12-04 (×6): qty 1

## 2023-12-04 MED ORDER — HYDROMORPHONE HCL 1 MG/ML IJ SOLN
0.5000 mg | INTRAMUSCULAR | Status: AC | PRN
  Administered 2023-12-04 – 2023-12-05 (×2): 0.5 mg via INTRAVENOUS
  Filled 2023-12-04 (×2): qty 1

## 2023-12-04 MED ORDER — OXYCODONE HCL 5 MG PO TABS
5.0000 mg | ORAL_TABLET | ORAL | Status: DC | PRN
  Administered 2023-12-05 – 2023-12-08 (×4): 5 mg via ORAL
  Filled 2023-12-04 (×4): qty 1

## 2023-12-04 MED ORDER — VITAMIN B-12 1000 MCG PO TABS
1000.0000 ug | ORAL_TABLET | Freq: Every day | ORAL | Status: DC
  Administered 2023-12-05 – 2023-12-08 (×4): 1000 ug via ORAL
  Filled 2023-12-04 (×4): qty 1

## 2023-12-04 MED ORDER — LIDOCAINE HCL 1 % IJ SOLN
INTRAMUSCULAR | Status: AC
  Filled 2023-12-04: qty 20

## 2023-12-04 NOTE — ED Notes (Signed)
Chaplin at bedside per pt request  

## 2023-12-04 NOTE — ED Notes (Signed)
 Patient refused for me to get morning labs, until he talk to Doctor. The Nurse was informed.

## 2023-12-04 NOTE — Progress Notes (Signed)
  Echocardiogram 2D Echocardiogram has been performed.  Alan Tapia 12/04/2023, 3:28 PM

## 2023-12-04 NOTE — Progress Notes (Signed)
 Chaplain rec'd page to contact [facility]. Chaplain notified that this pt would like to speak with a chaplain. Chaplain found pt sleeping in room. Chaplain did not disturb. Chaplain will refer pt to daytime Spiritual Care team.

## 2023-12-04 NOTE — ED Notes (Signed)
 CCMD called.

## 2023-12-04 NOTE — ED Notes (Signed)
 This RN just entered the room again to address the phone call to talk to the MD, to let the visitor know that the MD was notified that they would like to speak with him and be transferred to another hospital. Upon entering room the bed rail was down again. This RN educated pt again as this RN placed the rail back up. Pt visitor states that the pt was told to sleep with the bed rail down by the MD. This RN is now contacting hospital MD again to address the bedrail issue

## 2023-12-04 NOTE — ED Notes (Signed)
 Pt moved to bariatric bed

## 2023-12-04 NOTE — ED Notes (Signed)
 Phlebotomist at bedside attempted to draw morning labs. Pt refused. He states he doesn't want his morning labs drawn until he speaks to the MD. MD made aware. This RN attempted to educate the pt on the importance of morning labs. Pt still refused. Charge nurse made aware.

## 2023-12-04 NOTE — ED Notes (Signed)
 Pts wife educated multiple times on reason to keep bed rail up. Each time this RN has been to the room this RN has placed the bed rail back up in place. NS have called x 2 to get bariatric or even just a hospital bed for pt. This RN was told that there are none in hospital, This RN then contacted [facility] to follow up and find a bed for this pt

## 2023-12-04 NOTE — ED Notes (Signed)
[place] sandwich given

## 2023-12-04 NOTE — Procedures (Signed)
 PROCEDURE SUMMARY:  Successful image-guided diagnostic and therapeutic thoracentesis from the left chest.  Yielded 1.9 liters of clear, yellow fluid.  No immediate complications.  EBL: zero Patient tolerated well.   Specimen sent for labs.  Post-procedure CXR ordered.   Please see imaging section of Epic for full dictation.  Dalores Weger NP 12/04/2023 12:51 PM

## 2023-12-04 NOTE — Progress Notes (Signed)
Came bedside for echo, but patient is not in room at this time.  

## 2023-12-04 NOTE — ED Notes (Signed)
 Pt and family educated on the importance of keeping the side rails up d/t to the pt being a fall risk. Pt and pt's family member verbalized understanding but has continued to leave bed rail down after being put up by this RN. The charge nurse on duty also provided education to the pt regarding the potential risk  of falls associated with leaving the side rail down. Pt's family member continues to put side rail down despite education. Pt informed that bariatric bed has been requested to enhance the pt's comfort and safety. Despite repeated education, the pt's family member continues to leave the side rail down. Education of pt and pt's family member ongoing .

## 2023-12-04 NOTE — Progress Notes (Signed)
 PROGRESS NOTE    Alan Tapia.  FMW:[id number] DOB: Oct 15, 1975 DOA: 12/03/2023 PCP: Patient, No Pcp Per    Brief Narrative:  48 year old with history of chronic combined heart failure, chronic proteinuria and hypoalbuminemia, peripheral arterial disease, type 2 diabetes, hypertension, bilateral BKA, neuropathy, morbid obesity presented with progressive shortness of breath over the last few weeks.  Worse for 1 day.  Positive for orthopnea and anasarca.  Had some issues with obtaining medications but now received from [facility].  He was also scheduled for thoracentesis tomorrow at [facility] as per patient.  In the emergency room hemodynamically stable, electrolytes stable.  Creatinine 1.6 with normal baseline.  BNP 898.  Admitted with IV diuresis.  Chest x-ray with left-sided pleural effusion, interstitial fluid.  Subjective: Patient seen and examined.  Wife at the bedside.  Known to this provider from previous prolonged hospitalization.  Patient tells me that last few days only he has too much fluid building up on his legs and belly.  He is concerned and wants to make sure that he does not need to stay months and months in the hospital that happened last time with leg infection.  Patient is very motivated at these days and wants to get discharged as soon as possible.  He is trying prosthesis these days. Agreeable to thoracentesis.  He thought going to thoracentesis is a major surgery and I counseled him.   Assessment & Plan:   Volume overload, anasarca Symptomatic left-sided pleural effusion Acute exacerbation of chronic combined heart failure with known ejection fraction 30 to 35%.  Medication noncompliance. Presented with anasarca, weight gain, orthopnea and PND. At home on Bumex ?  Unable to verify. Amlodipine , Entresto .  Baseline weight not available, he is wheelchair-bound. Continue Bumex  2 mg IV twice daily, intake output monitoring. repeat echocardiogram.  Low-sodium diet.  Heart failure navigator consult.  Counseling. Resume home Entresto  once renal function stabilizes.  Chronic proteinuria and hypoalbuminemia Nephrotic type syndrome.  Mostly chronic. UA with protein more than 300 mg/dL.  Checking urine protein creatinine ratio, TSH.  This is likely chronic.  AKI: Recent baseline creatinine 0.8.  Creatinine 1.6 on admission.  Suspect cardiorenal syndrome.  Will monitor on diuretics.  Recheck tomorrow morning.  Hypertensive urgency: Blood pressure 190 on presentation.  Back on amlodipine .  Labetalol  as needed.  Further GDMT based on renal functions.  Resume clonidine .  Acute on chronic hypoxemic respiratory failure on 2 L oxygen: With symptomatic pleural effusion.  Diagnostic and therapeutic thoracentesis today.  Incentive spirometry.  Type 2 diabetes: A1c pending.  On sliding scale.  Bilateral BKA: Stable.  Working with occupational therapy for prosthesis..  Consult PT OT in the hospital.     DVT prophylaxis: enoxaparin  (LOVENOX ) injection 40 mg Start: 12/04/23 2200   Code Status: Full code Family Communication: Wife at the bedside Disposition Plan: Status is: Inpatient Remains inpatient appropriate because: IV diuresis, significant volume overload     Consultants:  Plan  Procedures:  None, thoracentesis requested  Antimicrobials:  None     Objective: Vitals:   12/04/23 0859 12/04/23 0930 12/04/23 0945 12/04/23 1030  BP: (!) 183/95 (!) 172/92 (!) 181/79 (!) 174/86  Pulse: (!) 103 (!) 102 (!) 104 100  Resp: (!) 24 (!) 25 (!) 25 19  Temp: 97.6 F (36.4 C)     TempSrc: Oral     SpO2: 100% 100% 100% 100%  Height:        Intake/Output Summary (Last 24 hours) at 12/04/2023 1055  Last data filed at 12/03/2023 2300 Gross per 24 hour  Intake --  Output 2000 ml  Net -2000 ml   There were no vitals filed for this visit.  Examination:  General exam: Morbidly obese gentleman laying on his left side.  On 4 L oxygen.   Pleasant and interactive and motivated.  In mild distress with shortness of breath. Respiratory system: Very difficult to auscultate, poor bilateral air entry. Cardiovascular system: S1 & S2 heard, RRR.  Tense edema both legs, subcutaneous edema of the abdomen. Gastrointestinal system: Distended, subcutaneous edema present, nontender.  Bowel sound present.   Central nervous system: Alert and oriented. No focal neurological deficits. Extremities: Symmetric 5 x 5 power. Tense edema both amputation stump as well as up to the thighs.    Data Reviewed: I have personally reviewed following labs and imaging studies  CBC: Recent Labs  Lab 12/03/23 2004  WBC 9.5  HGB 12.3*  HCT 41.9  MCV 87.1  PLT 433*   Basic Metabolic Panel: Recent Labs  Lab 12/03/23 2010  NA 138  K 5.2*  CL 107  CO2 24  GLUCOSE 93  BUN 24*  CREATININE 1.61*  CALCIUM 8.0*   GFR: CrCl cannot be calculated (Unknown ideal weight.). Liver Function Tests: Recent Labs  Lab 12/03/23 2010  AST 15  ALT 11  ALKPHOS 139*  BILITOT 0.6  PROT 6.1*  ALBUMIN  1.8*   No results for input(s): LIPASE, AMYLASE in the last 168 hours. No results for input(s): AMMONIA in the last 168 hours. Coagulation Profile: No results for input(s): INR, PROTIME in the last 168 hours. Cardiac Enzymes: No results for input(s): CKTOTAL, CKMB, CKMBINDEX, TROPONINI in the last 168 hours. BNP (last 3 results) No results for input(s): PROBNP in the last 8760 hours. HbA1C: No results for input(s): HGBA1C in the last 72 hours. CBG: No results for input(s): GLUCAP in the last 168 hours. Lipid Profile: No results for input(s): CHOL, HDL, LDLCALC, TRIG, CHOLHDL, LDLDIRECT in the last 72 hours. Thyroid Function Tests: No results for input(s): TSH, T4TOTAL, FREET4, T3FREE, THYROIDAB in the last 72 hours. Anemia Panel: No results for input(s): VITAMINB12, FOLATE, FERRITIN, TIBC, IRON ,  RETICCTPCT in the last 72 hours. Sepsis Labs: No results for input(s): PROCALCITON, LATICACIDVEN in the last 168 hours.  No results found for this or any previous visit (from the past 240 hours).       Radiology Studies: DG Chest 1 View Result Date: 12/03/2023 EXAM: 1 VIEW XRAY OF THE CHEST 12/03/2023 09:31:46 PM COMPARISON: 06/19/2023 CLINICAL HISTORY: shob. Best possible pt unable to lay flat and techs had to hold.had to use extreme angles FINDINGS: LUNGS AND PLEURA: Moderate interstitial edema. Large left pleural effusion, increased. HEART AND MEDIASTINUM: No acute abnormality of the cardiac and mediastinal silhouettes. BONES AND SOFT TISSUES: No acute osseous abnormality. IMPRESSION: 1. Moderate interstitial edema. 2. Large left pleural effusion, increased. Electronically signed by: Pinkie Pebbles MD 12/03/2023 09:38 PM EDT RP Workstation: [id number]        Scheduled Meds:  amLODipine   5 mg Oral QHS   bumetanide  (BUMEX ) IV  2 mg Intravenous BID   enoxaparin  (LOVENOX ) injection  40 mg Subcutaneous Q24H   pantoprazole   40 mg Oral Daily   sodium chloride  flush  3 mL Intravenous Q12H   Continuous Infusions:   LOS: 1 day    Time spent: 55 minutes    Renato Applebaum, MD [facility]

## 2023-12-04 NOTE — Progress Notes (Signed)
 Responded to Spiritual Care Consult to support patient who is schedule for surgery today.  Pt. Expressed he was having some anxiety. Chaplain provided emotional and spiritual support. There is a visitor at bedside.  Chaplain available as needed.  Rayleen Dade, [place], [facility], Pager [phone]

## 2023-12-04 NOTE — TOC Initial Note (Addendum)
 Transition of Care (TOC) - Initial/Assessment Note    Patient Details  Name: Alan Tapia. MRN: [id number] Date of Birth: December 24, 1975  Transition of Care [facility]) CM/SW Contact:    Rosalva Jon Bloch, RN Phone Number: 12/04/2023, 3:26 PM  Clinical Narrative:                 Presents with SOB. Hx of diabetes, bilateral BKA, CHF, CKD, peripheral vascular disease.    - S/P thoracentesis, 1/25   From home with wife. PTA required mod assistance with ADL's. Pt without home health services.  DME: hospital bed, W/C, oxygen , RW, prosthetic legs @ home, provider [employer]. Wife with concerns regarding [employer] repossessing DME : oxygen, W/C and hospital bed. NCM called Mitch [phone]) with [employer] and shared concern.  Mitch to f/u with NCM after researching concern ...  PT/OT  EVALUATIONS PENDING.  Pt without PCP, NCM arranged appointment with [facility] [place] and noted on AVS.   [facility] team following and will assist with needs...  Expected Discharge Plan: Home/Self Care Barriers to Discharge: Continued Medical Work up   Patient Goals and CMS Choice            Expected Discharge Plan and Services   Discharge Planning Services: CM Consult   Living arrangements for the past 2 months: Single Family Home                                      Prior Living Arrangements/Services Living arrangements for the past 2 months: Single Family Home Lives with:: Spouse Patient language and need for interpreter reviewed:: Yes Do you feel safe going back to the place where you live?: Yes      Need for Family Participation in Patient Care: Yes (Comment) Care giver support system in place?: Yes (comment) Current home services: DME (hospital bed, W/C, oxygen , RW, prosthetic legs) Criminal Activity/Legal Involvement Pertinent to Current Situation/Hospitalization: No - Comment as needed  Activities of Daily Living      Permission  Sought/Granted                  Emotional Assessment       Orientation: : Oriented to Self, Oriented to Place, Oriented to  Time, Oriented to Situation Alcohol / Substance Use: Not Applicable Psych Involvement: No (comment)  Admission diagnosis:  Acute exacerbation of CHF (congestive heart failure) (HCC) [I50.9] Acute systolic congestive heart failure (HCC) [I50.21] Patient Active Problem List   Diagnosis Date Noted   Acute exacerbation of CHF (congestive heart failure) (HCC) 12/03/2023   Anasarca 12/03/2023   Proteinuria 12/03/2023   Pleural effusion due to CHF (congestive heart failure) (HCC) 12/03/2023   Problem related to health literacy 12/03/2023   Chronic pain 05/19/2023   Obesity, Class II, BMI 35-39.9 05/11/2023   COVID-19 virus infection 05/01/2023   Pressure injury of skin 05/01/2023   Malnutrition of moderate degree 04/14/2023   Subacute osteomyelitis of right foot (HCC) 04/08/2023   Cutaneous abscess of right foot 04/08/2023   S/P BKA (below knee amputation), right [facility]) - 04-08-2023 04/08/2023   Acute osteomyelitis of right foot (HCC) 04/07/2023   Acute on chronic anemia 04/07/2023   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 04/07/2023   Insulin  dependent type 2 diabetes mellitus (HCC) 04/07/2023   CKD (chronic kidney disease), stage II 04/07/2023   Hx  of left BKA (HCC) 04/07/2023   PVD (peripheral vascular disease) (HCC) 04/07/2023   Constipation 09/11/2022   Type 2 diabetes mellitus with hyperglycemia, with long-term current use of insulin  (HCC) 09/09/2022   Depressed left ventricular ejection fraction 09/05/2022   Phantom pain 09/02/2022   Adjustment disorder 08/29/2022   Essential hypertension 08/27/2022   Dilated cardiomyopathy (HCC) 08/26/2022   Below-knee amputation of left lower extremity (HCC) 08/22/2022   Sepsis (HCC) 08/15/2022   Uncontrolled type 2 diabetes mellitus with hyperglycemia, with  long-term current use of insulin  (HCC) 06/06/2022   PCP:  Patient, No Pcp Per Pharmacy:   [facility] [phone] - [address] [place], [place] - [address] AT [facility] OF [address] [place] [name] [address] Phone: [phone] Fax: [phone]  [id number] [place], [name] - [address] AT [facility] [address] [name] [address] Phone: [phone] Fax: [phone]  [id number] - [address] [place] - [address] [name] [address] Phone: [phone] Fax: [phone]  [facility] [address] [name] [address] Phone: [phone] Fax: [phone]  [facility] [address] [place], [name] - [address] [name] [address] Phone: [phone] Fax: [phone]     Social Drivers of Health (SDOH) Social History: SDOH Screenings   Food Insecurity: No Food Insecurity (04/07/2023)  Housing: Unknown (04/23/2023)  Recent Concern: Housing - Medium Risk (04/07/2023)  Transportation Needs: No Transportation Needs (04/07/2023)  Recent Concern: Transportation Needs - Unmet Transportation Needs (02/17/2023)   Received from [facility]  Utilities: At Risk (04/07/2023)  Alcohol Screen: Low Risk  (08/20/2022)  Financial Resource Strain: Patient Unable To Answer (08/20/2022)  Tobacco Use: Low Risk  (08/25/2023)   SDOH Interventions:     Readmission Risk Interventions    04/07/2023    2:34 PM  Readmission Risk Prevention Plan  Transportation Screening Complete  PCP or Specialist Appt within 3-5 Days Complete  HRI or Home Care Consult Complete  Social Work Consult for Recovery Care Planning/Counseling Complete  Palliative Care Screening Not Applicable  Medication Review Oceanographer) Complete

## 2023-12-05 DIAGNOSIS — R601 Generalized edema: Secondary | ICD-10-CM | POA: Diagnosis not present

## 2023-12-05 DIAGNOSIS — I5043 Acute on chronic combined systolic (congestive) and diastolic (congestive) heart failure: Secondary | ICD-10-CM | POA: Diagnosis not present

## 2023-12-05 LAB — BASIC METABOLIC PANEL WITH GFR
Anion gap: 10 (ref 5–15)
BUN: 22 mg/dL — ABNORMAL HIGH (ref 6–20)
CO2: 23 mmol/L (ref 22–32)
Calcium: 8.3 mg/dL — ABNORMAL LOW (ref 8.9–10.3)
Chloride: 107 mmol/L (ref 98–111)
Creatinine, Ser: 1.49 mg/dL — ABNORMAL HIGH (ref 0.61–1.24)
GFR, Estimated: 58 mL/min — ABNORMAL LOW (ref >60)
Glucose, Bld: 79 mg/dL (ref 70–99)
Potassium: 4.4 mmol/L (ref 3.5–5.1)
Sodium: 140 mmol/L (ref 135–145)

## 2023-12-05 MED ORDER — ENOXAPARIN SODIUM 80 MG/0.8ML IJ SOSY
80.0000 mg | PREFILLED_SYRINGE | INTRAMUSCULAR | Status: DC
  Administered 2023-12-05 – 2023-12-07 (×3): 80 mg via SUBCUTANEOUS
  Filled 2023-12-05 (×4): qty 0.8

## 2023-12-05 MED ORDER — GUAIFENESIN 100 MG/5ML PO LIQD
5.0000 mL | ORAL | Status: DC | PRN
  Administered 2023-12-05: 5 mL via ORAL
  Filled 2023-12-05: qty 10

## 2023-12-05 NOTE — Plan of Care (Signed)
  Problem: Clinical Measurements: Goal: Will remain free from infection Outcome: Progressing   Problem: Elimination: Goal: Will not experience complications related to bowel motility Outcome: Progressing   Problem: Safety: Goal: Ability to remain free from injury will improve Outcome: Progressing

## 2023-12-05 NOTE — Evaluation (Signed)
 Occupational Therapy Evaluation Patient Details Name: Alan Tapia. MRN: [id number] DOB: 1976/04/02 Today's Date: 12/05/2023   History of Present Illness   Pt is a 48 y.o male admitted 7/24 for SOB. Imaging showed L pleural effusion. S/p thoracentesis 7/25.  PMH: bil BKA, DM, CHF, CKD, PVD     Clinical Impressions Pt admitted based on above, and was seen based on problem list below. PTA pt was mostly mod I with ADLs from w/c, but receiving assistance for toileting at bed level. Pt recently received bil prosthetics and was working towards standing with RW, prior to prosthetics pt was completing SB transfers with CGA. Limited to bed level eval today d/t stomach pain. Pt's BUE ROM WFL, but decreased strength from baseline. Will determine post-acute rehab once pain is better managed and pt able to increase participation. OT will continue to follow acutely to maximize functional independence     If plan is discharge home, recommend the following:   A lot of help with walking and/or transfers;A lot of help with bathing/dressing/bathroom;Assistance with cooking/housework     Functional Status Assessment   Patient has had a recent decline in their functional status and demonstrates the ability to make significant improvements in function in a reasonable and predictable amount of time.     Equipment Recommendations   Other (comment) (TBD)      Precautions/Restrictions   Precautions Precautions: Fall Recall of Precautions/Restrictions: Intact Restrictions Weight Bearing Restrictions Per Provider Order: No     Mobility Bed Mobility   General bed mobility comments: pt declined d/t pain    Transfers Overall transfer level: Needs assistance     General transfer comment: Pt declined          ADL either performed or assessed with clinical judgement   ADL Overall ADL's : Needs assistance/impaired Eating/Feeding: Set up;Bed level   Grooming: Set up;Bed level    Upper Body Bathing: Minimal assistance;Bed level   Lower Body Bathing: Maximal assistance;Bed level   Upper Body Dressing : Minimal assistance;Bed level   Lower Body Dressing: Maximal assistance;Bed level       Toileting- Clothing Manipulation and Hygiene: Total assistance;Bed level         General ADL Comments: largely based on clinical judgement. pt declining OOB d/t pain     Vision Baseline Vision/History: 1 Wears glasses Patient Visual Report: No change from baseline Vision Assessment?: No apparent visual deficits            Pertinent Vitals/Pain Pain Assessment Pain Assessment: 0-10 Pain Score: 10-Worst pain ever Pain Location: stomach and aches all over Pain Descriptors / Indicators: Aching, Sore Pain Intervention(s): Limited activity within patient's tolerance     Extremity/Trunk Assessment Upper Extremity Assessment Upper Extremity Assessment: Generalized weakness   Lower Extremity Assessment Lower Extremity Assessment: Defer to PT evaluation   Cervical / Trunk Assessment Cervical / Trunk Assessment: Normal   Communication Communication Communication: No apparent difficulties   Cognition Arousal: Alert, Lethargic Behavior During Therapy: WFL for tasks assessed/performed Cognition: No apparent impairments       Following commands: Intact       Cueing  General Comments   Cueing Techniques: Verbal cues  Pt O2 levels 100% at rest on 2L, weaned to 1L           Home Living Family/patient expects to be discharged to:: Private residence Living Arrangements: Spouse/significant other Available Help at Discharge: Family;Available 24 hours/day Type of Home: Other(Comment) (condo  1 floor) Home Access: Stairs to enter  Entrance Stairs-Number of Steps: 1 Entrance Stairs-Rails: Right;Left Home Layout: One level     Bathroom Shower/Tub: Chief Strategy Officer: Standard     Home Equipment: Cane - Programmer, applications (2  wheels);BSC/3in1;Hospital bed;Wheelchair - manual   Additional Comments: pt in the ?living room      Prior Functioning/Environment Prior Level of Function : Needs assist       Mobility Comments: mostrly Mod I from wheelchair level; reports he transfers from bed to w/c and to [facility] with Mod I  until the last 2 weeks (july25) ADLs Comments: Mostly mod I with lateral leans for LB dressing, assist for toileting at bed level, reports can not scoot to [facility]    OT Problem List: Decreased strength;Decreased range of motion;Decreased activity tolerance;Impaired balance (sitting and/or standing);Cardiopulmonary status limiting activity   OT Treatment/Interventions: Self-care/ADL training;Therapeutic exercise;DME and/or AE instruction;Therapeutic activities;Visual/perceptual remediation/compensation;Patient/family education      OT Goals(Current goals can be found in the care plan section)   Acute Rehab OT Goals Patient Stated Goal: To walk again OT Goal Formulation: With patient Time For Goal Achievement: 12/18/23 Potential to Achieve Goals: Good   OT Frequency:  Min 2X/week    Co-evaluation PT/OT/SLP Co-Evaluation/Treatment: Yes Reason for Co-Treatment: For patient/therapist safety;To address functional/ADL transfers PT goals addressed during session: Mobility/safety with mobility OT goals addressed during session: ADL's and self-care;Strengthening/ROM      AM-PAC OT 6 Clicks Daily Activity     Outcome Measure Help from another person eating meals?: None Help from another person taking care of personal grooming?: A Little Help from another person toileting, which includes using toliet, bedpan, or urinal?: Total Help from another person bathing (including washing, rinsing, drying)?: A Lot Help from another person to put on and taking off regular upper body clothing?: A Little Help from another person to put on and taking off regular lower body clothing?: A Lot 6 Click Score: 15    End of Session Equipment Utilized During Treatment: Oxygen Nurse Communication: Mobility status  Activity Tolerance: Patient limited by pain Patient left: in bed;with call bell/phone within reach;with bed alarm set;with family/visitor present  OT Visit Diagnosis: Unsteadiness on feet (R26.81);Other abnormalities of gait and mobility (R26.89);Muscle weakness (generalized) (M62.81)                Time: 8973-8947 OT Time Calculation (min): 26 min Charges:  OT General Charges $OT Visit: 1 Visit OT Evaluation $OT Eval Moderate Complexity: 1 Mod  Adrianne BROCKS, OT  Acute Rehabilitation Services Office [phone] Secure chat preferred   Adrianne GORMAN Savers 12/05/2023, 12:07 PM

## 2023-12-05 NOTE — Evaluation (Signed)
 Physical Therapy Evaluation Patient Details Name: Alan Tapia. MRN: [id number] DOB: 1975-10-01 Today's Date: 12/05/2023  History of Present Illness  Pt is a 48 y.o male admitted 7/24 for SOB. Imaging showed L pleural effusion. S/p thoracentesis 7/25.  PMH: bil BKA, DM, CHF, CKD, PVD  Clinical Impression  Pt admitted with/for SOB, fluid overload.  This eval was limited today due to overall pain and pt discomfort.  Mobility assessment not completed, just general strength testing.  Pt has 2 prostheses not in the hospital today which will be useful for mobility early next week.  Pt currently limited functionally due to the problems listed. ( See problems list.)   Pt will benefit from PT to maximize function and safety in order to get ready for next venue listed below.  Expect pt could benefit from >3 hours of therapy, but TBD further once mobility assessed.           If plan is discharge home, recommend the following: A lot of help with walking and/or transfers;A lot of help with bathing/dressing/bathroom;Assistance with cooking/housework;Assist for transportation   Can travel by private vehicle        Equipment Recommendations None recommended by PT  Recommendations for Other Services  Rehab consult    Functional Status Assessment Patient has had a recent decline in their functional status and demonstrates the ability to make significant improvements in function in a reasonable and predictable amount of time.     Precautions / Restrictions Precautions Precautions: Fall Recall of Precautions/Restrictions: Intact      Mobility  Bed Mobility               General bed mobility comments: pt deferred due to pain and feeling poorly    Transfers Overall transfer level: Needs assistance                 General transfer comment: deferred by pt.\    Ambulation/Gait                  Stairs            Wheelchair Mobility     Tilt Bed    Modified  Rankin (Stroke Patients Only)       Balance                                             Pertinent Vitals/Pain Pain Assessment Pain Assessment: 0-10 Pain Score: 10-Worst pain ever Pain Location: stomach and aches all over Pain Descriptors / Indicators: Aching, Sore (Like a punch to the gut.) Pain Intervention(s): Limited activity within patient's tolerance    Home Living Family/patient expects to be discharged to:: Private residence Living Arrangements: Spouse/significant other Available Help at Discharge: Family;Available 24 hours/day Type of Home: Other(Comment) (condo  1 floor) Home Access: Stairs to enter   Entrance Stairs-Number of Steps: .5-1   Home Layout: One level Home Equipment: Cane - Programmer, applications (2 wheels);BSC/3in1;Hospital bed;Wheelchair - manual Additional Comments: pt in the ?living room    Prior Function Prior Level of Function : Needs assist             Mobility Comments: largely Mod I from wheelchair level; reports he transfers from bed to w/c and to [facility] with Mod I  until the last 2 weeks (july25) ADLs Comments: Independent to Set up for UB ADLs, Mod to Max assist  for LB bathing and dressing; able to toilet with Mod I to Min assist with drop-arm [facility]     Extremity/Trunk Assessment   Upper Extremity Assessment Upper Extremity Assessment: Defer to OT evaluation;Generalized weakness    Lower Extremity Assessment Lower Extremity Assessment: Generalized weakness (bil general weakness, legs quite edematous, but otherwise have healed well.)       Communication   Communication Communication: No apparent difficulties    Cognition Arousal: Alert, Lethargic Behavior During Therapy: WFL for tasks assessed/performed   PT - Cognitive impairments: No apparent impairments                         Following commands: Intact       Cueing Cueing Techniques: Verbal cues     General Comments General comments (skin  integrity, edema, etc.): Mobility TBA early next week when pt feels better.    Exercises     Assessment/Plan    PT Assessment Patient needs continued PT services  PT Problem List Decreased strength;Decreased range of motion;Decreased activity tolerance;Decreased mobility;Decreased knowledge of use of DME;Cardiopulmonary status limiting activity;Obesity;Pain       PT Treatment Interventions Functional mobility training;Therapeutic activities;Therapeutic exercise;Balance training;Patient/family education;DME instruction    PT Goals (Current goals can be found in the Care Plan section)  Acute Rehab PT Goals Patient Stated Goal: Work on independence at w/c level and start/continue using  prostheses for standing and transfers. PT Goal Formulation: With patient Time For Goal Achievement: 12/18/23 Potential to Achieve Goals: Good    Frequency Min 2X/week     Co-evaluation PT/OT/SLP Co-Evaluation/Treatment: Yes Reason for Co-Treatment: For patient/therapist safety;To address functional/ADL transfers PT goals addressed during session: Mobility/safety with mobility OT goals addressed during session: ADL's and self-care;Strengthening/ROM       AM-PAC PT 6 Clicks Mobility  Outcome Measure Help needed turning from your back to your side while in a flat bed without using bedrails?: A Lot Help needed moving from lying on your back to sitting on the side of a flat bed without using bedrails?: A Lot Help needed moving to and from a bed to a chair (including a wheelchair)?: Total Help needed standing up from a chair using your arms (e.g., wheelchair or bedside chair)?: Total Help needed to walk in hospital room?: Total Help needed climbing 3-5 steps with a railing? : Total 6 Click Score: 8    End of Session Equipment Utilized During Treatment: Oxygen Activity Tolerance: Patient tolerated treatment well Patient left: in bed;with call bell/phone within reach;with family/visitor  present Nurse Communication: Mobility status PT Visit Diagnosis: Other abnormalities of gait and mobility (R26.89);Muscle weakness (generalized) (M62.81);Pain Pain - part of body:  (abdomen and general)    Time: 8974-8947 PT Time Calculation (min) (ACUTE ONLY): 27 min   Charges:   PT Evaluation $PT Eval Moderate Complexity: 1 Mod   PT General Charges $$ ACUTE PT VISIT: 1 Visit         12/05/2023  India HERO., PT Acute Rehabilitation Services [phone]  (office)  Vinie GAILS Demiana Crumbley 12/05/2023, 11:12 AM

## 2023-12-05 NOTE — Progress Notes (Signed)
 PROGRESS NOTE    Alan Tapia.  FMW:[id number] DOB: 30-Nov-1975 DOA: 12/03/2023 PCP: Patient, No Pcp Per    Brief Narrative:  48 year old with history of chronic combined heart failure, chronic proteinuria and hypoalbuminemia, peripheral arterial disease, type 2 diabetes, hypertension, bilateral BKA, neuropathy, morbid obesity presented with progressive shortness of breath over the last few weeks.  Worse for 1 day.  Positive for orthopnea and anasarca.  Had some issues with obtaining medications but now received from [facility].  He was also scheduled for thoracentesis tomorrow at [facility] as per patient.  In the emergency room hemodynamically stable, electrolytes stable.  Creatinine 1.6 with normal baseline.  BNP 898.  Admitted with IV diuresis.  Chest x-ray with left-sided pleural effusion, interstitial fluid.  Subjective: Patient seen and examined.  Wife at the bedside.  Feels much better after diuresis and 2 L removed from left lungs.  Still has some swelling of the legs.  Patient and wife tells me that he was not on any Bumex  or Entresto  for last few weeks because his appointment keeps getting postponed at [facility]. Urine output is not accurate, however subjectively feels better.  No other overnight events.   Assessment & Plan:   Volume overload, anasarca Symptomatic left-sided pleural effusion Acute exacerbation of chronic combined heart failure with known ejection fraction 30 to 35%.  Medication noncompliance/unavailability:  Presented with anasarca, weight gain, orthopnea and PND. At home on Bumex  2 mg twice daily, currently not taking. Amlodipine , Entresto .  Baseline weight not available, he is wheelchair-bound. Continue Bumex  2 mg IV twice daily, intake output monitoring.  Repeat echocardiogram is stable with previously known ejection fraction of 35%.. Low-sodium diet.  Heart failure navigator consult.  Counseling. Resume home Entresto   once renal function stabilizes.  Recheck renal functions every day.  Chronic proteinuria and hypoalbuminemia Nephrotic type syndrome.  Mostly chronic. UA with protein more than 300 mg/dL.  Checking urine protein creatinine ratio, TSH.  This is likely chronic.  AKI: Recent baseline creatinine 0.8.  Creatinine 1.6 on admission.  Suspect cardiorenal syndrome.  Appropriately improving on diuretics.  Hypertensive urgency: Blood pressure 190 on presentation.  Back on amlodipine .  Labetalol  as needed.  Further GDMT based on renal functions.  Resume clonidine .  Acute on chronic hypoxemic respiratory failure on 2 L oxygen: With symptomatic pleural effusion.  1.9 L transudate removed with clinical response.  Continue incentive spirometry.  Stabilizing.  Type 2 diabetes: A1c pending.  On sliding scale.  Bilateral BKA: Stable.  Working with occupational therapy for prosthesis..  Consult PT OT in the hospital.     DVT prophylaxis: Lovenox  subcu   Code Status: Full code Family Communication: Wife at the bedside Disposition Plan: Status is: Inpatient Remains inpatient appropriate because: IV diuresis, significant volume overload     Consultants:  None  Procedures:  Left thoracentesis  Antimicrobials:  None     Objective: Vitals:   12/05/23 0004 12/05/23 0425 12/05/23 0635 12/05/23 0755  BP: (!) 159/87 (!) 172/83 (!) 158/79 (!) 168/90  Pulse: 84 (!) 102 96 (!) 109  Resp: 19 (!) 22 18 (!) 30  Temp: 98.4 F (36.9 C) 97.6 F (36.4 C) 97.6 F (36.4 C) 99 F (37.2 C)  TempSrc: Oral Oral Oral Axillary  SpO2: 92% 96% 96% 97%  Weight:  (!) 164.2 kg    Height:  6' 4 (1.93 m)      Intake/Output Summary (Last 24 hours) at 12/05/2023 1008 Last data filed at  12/05/2023 0900 Gross per 24 hour  Intake 477 ml  Output 1300 ml  Net -823 ml   Filed Weights   12/04/23 1900 12/05/23 0425  Weight: (!) 165.6 kg (!) 164.2 kg    Examination:  General exam: Chronically sick looking and  morbidly obese gentleman but looks fairly comfortable and pleasant interactive today. Respiratory system: Very difficult to auscultate, poor bilateral air entry.  On 2 L oxygen. Cardiovascular system: S1 & S2 heard, RRR.  1+ edema both legs, obese extremities.  He does have some subcutaneous edema of the abdomen. Gastrointestinal system: Obese and pendulous.  Nontender.     Central nervous system: Alert and oriented. No focal neurological deficits. Extremities: Symmetric 5 x 5 power. 1+ edema both amputation stump as well as up to the thighs.    Data Reviewed: I have personally reviewed following labs and imaging studies  CBC: Recent Labs  Lab 12/03/23 2004 12/04/23 1135  WBC 9.5 10.1  HGB 12.3* 12.6*  HCT 41.9 41.7  MCV 87.1 85.6  PLT 433* 490*   Basic Metabolic Panel: Recent Labs  Lab 12/03/23 2010 12/04/23 1135 12/05/23 0343  NA 138  --  140  K 5.2*  --  4.4  CL 107  --  107  CO2 24  --  23  GLUCOSE 93  --  79  BUN 24*  --  22*  CREATININE 1.61*  --  1.49*  CALCIUM 8.0*  --  8.3*  MG  --  2.2  --   PHOS  --  4.1  --    GFR: Estimated Creatinine Clearance: 101 mL/min (A) (by C-G formula based on SCr of 1.49 mg/dL (H)). Liver Function Tests: Recent Labs  Lab 12/03/23 2010  AST 15  ALT 11  ALKPHOS 139*  BILITOT 0.6  PROT 6.1*  ALBUMIN  1.8*   No results for input(s): LIPASE, AMYLASE in the last 168 hours. No results for input(s): AMMONIA in the last 168 hours. Coagulation Profile: No results for input(s): INR, PROTIME in the last 168 hours. Cardiac Enzymes: No results for input(s): CKTOTAL, CKMB, CKMBINDEX, TROPONINI in the last 168 hours. BNP (last 3 results) No results for input(s): PROBNP in the last 8760 hours. HbA1C: Recent Labs    12/04/23 1135  HGBA1C 4.6*   CBG: No results for input(s): GLUCAP in the last 168 hours. Lipid Profile: Recent Labs    12/04/23 1135  CHOL 234*  HDL 71  LDLCALC 148*  TRIG 77  CHOLHDL  3.3   Thyroid Function Tests: Recent Labs    12/04/23 1135  TSH 5.020*   Anemia Panel: No results for input(s): VITAMINB12, FOLATE, FERRITIN, TIBC, IRON , RETICCTPCT in the last 72 hours. Sepsis Labs: No results for input(s): PROCALCITON, LATICACIDVEN in the last 168 hours.  No results found for this or any previous visit (from the past 240 hours).       Radiology Studies: ECHOCARDIOGRAM COMPLETE Result Date: 12/04/2023    ECHOCARDIOGRAM REPORT   Patient Name:   Dakhari Zuver. Date of Exam: 12/04/2023 Medical Rec #:  [id number]          Height:       76.0 in Accession #:    [id number]         Weight:       282.6 lb Date of Birth:  [birth year]          BSA:          2.566 m Patient Age:  48 years           BP:           174/86 mmHg Patient Gender: M                  HR:           87 bpm. Exam Location:  Inpatient Procedure: 2D Echo, Cardiac Doppler and Color Doppler (Both Spectral and Color            Flow Doppler were utilized during procedure). Indications:    I50.40* Unspecified combined systolic (congestive) and diastolic                 (congestive) heart failure  History:        Patient has prior history of Echocardiogram examinations, most                 recent 05/15/2023. CHF and Cardiomyopathy,                 Signs/Symptoms:Bacteremia; Risk Factors:Diabetes and                 Hypertension.  Sonographer:    Ellouise Mose RDCS Referring Phys: [id number] [facility]  Sonographer Comments: Technically difficult study due to poor echo windows. Image acquisition challenging due to patient body habitus. Patient leaning on the bed rails on the left side of bed. Apicals medial due to patient nt bein able to move due to discomfort. IMPRESSIONS  1. Left ventricular ejection fraction, by estimation, is 35 to 40%. The left ventricle has moderately decreased function. The left ventricle demonstrates global hypokinesis. The left ventricular internal cavity size was severely dilated.  There is moderate left ventricular hypertrophy. Indeterminate diastolic filling due to E-A fusion.  2. Right ventricular systolic function is mildly reduced. The right ventricular size is moderately enlarged. Mildly increased right ventricular wall thickness.  3. There is no evidence of cardiac tamponade. Large pleural effusion in both left and right lateral regions.  4. The mitral valve is normal in structure. Trivial mitral valve regurgitation. No evidence of mitral stenosis.  5. The aortic valve is tricuspid. Aortic valve regurgitation is not visualized. No aortic stenosis is present.  6. The inferior vena cava is dilated in size with <50% respiratory variability, suggesting right atrial pressure of 15 mmHg. FINDINGS  Left Ventricle: Left ventricular ejection fraction, by estimation, is 35 to 40%. The left ventricle has moderately decreased function. The left ventricle demonstrates global hypokinesis. The left ventricular internal cavity size was severely dilated. There is moderate left ventricular hypertrophy. Indeterminate diastolic filling due to E-A fusion. Right Ventricle: The right ventricular size is moderately enlarged. Mildly increased right ventricular wall thickness. Right ventricular systolic function is mildly reduced. Left Atrium: Left atrial size was normal in size. Right Atrium: Right atrial size was normal in size. Pericardium: Trivial pericardial effusion is present. The pericardial effusion appears to contain focal strands. There is no evidence of cardiac tamponade. Mitral Valve: The mitral valve is normal in structure. Trivial mitral valve regurgitation. No evidence of mitral valve stenosis. Tricuspid Valve: The tricuspid valve is normal in structure. Tricuspid valve regurgitation is trivial. No evidence of tricuspid stenosis. Aortic Valve: The aortic valve is tricuspid. Aortic valve regurgitation is not visualized. No aortic stenosis is present. Pulmonic Valve: The pulmonic valve was normal  in structure. Pulmonic valve regurgitation is not visualized. No evidence of pulmonic stenosis. Aorta: The aortic root and ascending aorta are structurally normal, with no evidence  of dilitation. Venous: The inferior vena cava is dilated in size with less than 50% respiratory variability, suggesting right atrial pressure of 15 mmHg. IAS/Shunts: No atrial level shunt detected by color flow Doppler. Additional Comments: There is a large pleural effusion in both left and right lateral regions.  LEFT VENTRICLE PLAX 2D LVIDd:         5.60 cm      Diastology LVIDs:         4.70 cm      LV e' medial:    3.48 cm/s LV PW:         1.60 cm      LV E/e' medial:  23.8 LV IVS:        1.50 cm      LV e' lateral:   16.40 cm/s LVOT diam:     2.30 cm      LV E/e' lateral: 5.0 LV SV:         64 LV SV Index:   25 LVOT Area:     4.15 cm  LV Volumes (MOD) LV vol d, MOD A2C: 189.0 ml LV vol d, MOD A4C: 183.0 ml LV vol s, MOD A2C: 119.0 ml LV vol s, MOD A4C: 94.0 ml LV SV MOD A2C:     70.0 ml LV SV MOD A4C:     183.0 ml LV SV MOD BP:      82.9 ml IVC IVC diam: 2.50 cm LEFT ATRIUM             Index        RIGHT ATRIUM           Index LA diam:        4.30 cm 1.68 cm/m   RA Area:     21.60 cm LA Vol (A2C):   31.0 ml 12.08 ml/m  RA Volume:   75.50 ml  29.42 ml/m LA Vol (A4C):   27.6 ml 10.76 ml/m LA Biplane Vol: 30.3 ml 11.81 ml/m  AORTIC VALVE LVOT Vmax:   88.00 cm/s LVOT Vmean:  58.200 cm/s LVOT VTI:    0.155 m  AORTA Ao Root diam: 3.20 cm Ao Asc diam:  3.00 cm MITRAL VALVE MV Area (PHT): 4.26 cm    SHUNTS MV Decel Time: 178 msec    Systemic VTI:  0.16 m MV E velocity: 82.80 cm/s  Systemic Diam: 2.30 cm MV A velocity: 67.05 cm/s MV E/A ratio:  1.23 Morene Brownie Electronically signed by Morene Brownie Signature Date/Time: 12/04/2023/3:46:56 PM    Final    DG Chest 1 View Result Date: 12/04/2023 CLINICAL DATA:  Pleural effusion. EXAM: CHEST  1 VIEW COMPARISON:  Chest radiograph dated 12/03/2023 FINDINGS: Shallow inspiration.  Moderate bilateral pleural effusions with bibasilar atelectasis. Pneumonia is not excluded. No pneumothorax. Stable cardiac silhouette. No acute osseous pathology. IMPRESSION: Moderate bilateral pleural effusions with bibasilar atelectasis. Electronically Signed   By: Vanetta Chou M.D.   On: 12/04/2023 13:18   IR THORACENTESIS ASP PLEURAL SPACE W/IMG GUIDE Result Date: 12/04/2023 INDICATION: Inpatient with large left pleural effusion with request for diagnostic and therapeutic thoracentesis. History of HF with progressive SOB over past few weeks. EXAM: ULTRASOUND GUIDED LEFT THORACENTESIS MEDICATIONS: 9 mL 1% lidocaine  COMPLICATIONS: None immediate. PROCEDURE: An ultrasound guided thoracentesis was thoroughly discussed with the patient and questions answered. The benefits, risks, alternatives and complications were also discussed. The patient understands and wishes to proceed with the procedure. Written consent was obtained. Ultrasound was performed to localize and  mark an adequate pocket of fluid in the left chest. The area was then prepped and draped in the normal sterile fashion. 1% Lidocaine  was used for local anesthesia. Under ultrasound guidance a 6 Fr Skater Safe-T-Centesis catheter was introduced. Thoracentesis was performed. The catheter was removed and a dressing applied. FINDINGS: A total of approximately 1.9 liters of clear, yellow fluid was removed. Samples were sent to the laboratory as requested by the clinical team. IMPRESSION: Successful ultrasound guided left thoracentesis yielding 1.9 liters of pleural fluid. Performed and dictated by Laymon Coast, NP Electronically Signed   By: Cordella Banner   On: 12/04/2023 12:59   DG Chest 1 View Result Date: 12/03/2023 EXAM: 1 VIEW XRAY OF THE CHEST 12/03/2023 09:31:46 PM COMPARISON: 06/19/2023 CLINICAL HISTORY: shob. Best possible pt unable to lay flat and techs had to hold.had to use extreme angles FINDINGS: LUNGS AND PLEURA: Moderate  interstitial edema. Large left pleural effusion, increased. HEART AND MEDIASTINUM: No acute abnormality of the cardiac and mediastinal silhouettes. BONES AND SOFT TISSUES: No acute osseous abnormality. IMPRESSION: 1. Moderate interstitial edema. 2. Large left pleural effusion, increased. Electronically signed by: Pinkie Pebbles MD 12/03/2023 09:38 PM EDT RP Workstation: [id number]        Scheduled Meds:  amLODipine   5 mg Oral QHS   bumetanide  (BUMEX ) IV  2 mg Intravenous BID   cloNIDine   0.1 mg Oral BID   cyanocobalamin   1,000 mcg Oral Daily   enoxaparin  (LOVENOX ) injection  80 mg Subcutaneous Q24H   pantoprazole   40 mg Oral Daily   sodium chloride  flush  3 mL Intravenous Q12H   Continuous Infusions:   LOS: 2 days    Time spent: 55 minutes    Renato Applebaum, MD [facility]

## 2023-12-06 DIAGNOSIS — N182 Chronic kidney disease, stage 2 (mild): Secondary | ICD-10-CM | POA: Diagnosis not present

## 2023-12-06 DIAGNOSIS — Z794 Long term (current) use of insulin: Secondary | ICD-10-CM

## 2023-12-06 DIAGNOSIS — I739 Peripheral vascular disease, unspecified: Secondary | ICD-10-CM | POA: Diagnosis not present

## 2023-12-06 DIAGNOSIS — E66813 Obesity, class 3: Secondary | ICD-10-CM

## 2023-12-06 DIAGNOSIS — I10 Essential (primary) hypertension: Secondary | ICD-10-CM | POA: Diagnosis not present

## 2023-12-06 DIAGNOSIS — E119 Type 2 diabetes mellitus without complications: Secondary | ICD-10-CM

## 2023-12-06 DIAGNOSIS — I5033 Acute on chronic diastolic (congestive) heart failure: Secondary | ICD-10-CM

## 2023-12-06 LAB — BASIC METABOLIC PANEL WITH GFR
Anion gap: 6 (ref 5–15)
BUN: 21 mg/dL — ABNORMAL HIGH (ref 6–20)
CO2: 25 mmol/L (ref 22–32)
Calcium: 7.7 mg/dL — ABNORMAL LOW (ref 8.9–10.3)
Chloride: 109 mmol/L (ref 98–111)
Creatinine, Ser: 1.48 mg/dL — ABNORMAL HIGH (ref 0.61–1.24)
GFR, Estimated: 58 mL/min — ABNORMAL LOW (ref >60)
Glucose, Bld: 100 mg/dL — ABNORMAL HIGH (ref 70–99)
Potassium: 4.2 mmol/L (ref 3.5–5.1)
Sodium: 140 mmol/L (ref 135–145)

## 2023-12-06 MED ORDER — SPIRONOLACTONE 12.5 MG HALF TABLET
12.5000 mg | ORAL_TABLET | Freq: Every day | ORAL | Status: DC
  Administered 2023-12-06 – 2023-12-08 (×3): 12.5 mg via ORAL
  Filled 2023-12-06 (×3): qty 1

## 2023-12-06 MED ORDER — LOSARTAN POTASSIUM 25 MG PO TABS
25.0000 mg | ORAL_TABLET | Freq: Every day | ORAL | Status: DC
  Administered 2023-12-07 – 2023-12-08 (×2): 25 mg via ORAL
  Filled 2023-12-06 (×2): qty 1

## 2023-12-06 NOTE — Hospital Course (Signed)
 Mr. Borghi was admitted to the hospital with the working diagnosis of heart failure exacerbation.   48 year old with history heart failure, peripheral arterial disease, type 2 diabetes, hypertension, bilateral BKA, neuropathy, and obesity presented with progressive dyspnea over the last few weeks. Worse for 1 day. Positive for orthopnea and anasarca. Patient non compliant with his medications.  He was also scheduled for thoracentesis as outpatient at [facility]. On his initial physical examination his blood pressure was 185/123, HR 108, 02 saturation 95%, lungs with decreased breath sounds and increased work of breathing, heart with S1 and S2 present and regular with no gallops or rubs, abdomen protuberant with no tenderness, positive lower extremity edema +++ pitting, bilateral BKA.   Na 138, K 5.2 Cl 107 bicarbonate 24 glucose 93 bun 24 cr 1.61 AST 15 ALT 11  BNP 898  High sensitive troponin 10 and 11 Wc 9,5 hgb 12.3 plt 433  Urine analysis SG 1,016, protein > 300, glucose 50, negative leukocytes and negative hgb.   Chest radiograph with cardiomegaly, bilateral hilar vascular congestion with central interstitial infiltrates, moderate left pleural effusion.   EKG 103 bpm, left axis deviation, normal intervals, qtc 476, sinus rhythm with no significant ST segment or T wave changes. Low voltage.   Patient was placed on furosemide  for diuresis.   07/25 left thoracentesis, 1.9 L removed, clear yellow fluid.  07/29 responded well to diuresis, patient will follow up with heart failure clinic as outpatient.

## 2023-12-06 NOTE — Assessment & Plan Note (Signed)
 Hold on amlodipine  and clonidine  Continue losartan  and isosorbide / hydralazine  for afterload reduction.  Continue with spironolactone .

## 2023-12-06 NOTE — Progress Notes (Signed)
  Progress Note   Patient: Alan Tapia. FMW:[id number] DOB: 1975/12/11 DOA: 12/03/2023     3 DOS: the patient was seen and examined on 12/06/2023   Brief hospital course: 48 year old with history of chronic combined heart failure, chronic proteinuria and hypoalbuminemia, peripheral arterial disease, type 2 diabetes, hypertension, bilateral BKA, neuropathy, morbid obesity presented with progressive shortness of breath over the last few weeks. Worse for 1 day. Positive for orthopnea and anasarca. Had some issues with obtaining medications but now received from [facility]. He was also scheduled for thoracentesis tomorrow at [facility] as per patient. In the emergency room hemodynamically stable, electrolytes stable. Creatinine 1.6 with normal baseline. BNP 898. Admitted with IV diuresis. Chest x-ray with left-sided pleural effusion, interstitial fluid.   Assessment and Plan: * Acute on chronic diastolic CHF (congestive heart failure) (HCC) Echocardiogram with reduced LV systolic function EF 35 to 40%, global hypokinesis, LV internal cavity with severe dilatation, moderate LVH, RV systolic function with mild reduction, RV with moderate enlargement, no significant valvular disease. LA and RA with normal size.   Urine output is 3,810 ml Systolic blood pressure 150 mmHg range.   Continue diuresis with bumetanide  2 mg IV bid RAAS inhibition with low dose spironolactone  and low dose losartan .  Possible resumption entresto  if renal function stable.  Due to body habitus not candidate for SGLT 2 inh at this point in time.    Essential hypertension Continue blood pressure control with clonidine , will hold on amlodipine  and start patient on losartan .  Follow up on blood pressure  CKD (chronic kidney disease), stage II Hyperkalemia.   Renal function with serum cr at 48 with K at 4,2 and serum bicarbonate at 25  Na 140   Will add spironolactone  with close follow up on  serum K .  (Patient allergic to furosemide )  Follow up renal function in am.   PVD (peripheral vascular disease) (HCC) Continue blood pressure control   Insulin  dependent type 2 diabetes mellitus (HCC) Glucose has been controlled, currently off insulin  therapy.  Fasting glucose today 100 mg/dl   Obesity, class 3 Calculated BMI is 42.2         Subjective: patient with improvement in his symptoms, but continue to have dyspnea and lower extremity edema.   Physical Exam: Vitals:   12/06/23 0727 12/06/23 0800 12/06/23 1151 12/06/23 1520  BP: (!) 175/89  (!) 157/74 (!) 158/73  Pulse: 87  79 88  Resp: (!) 22  20 20   Temp: 98.6 F (37 C)  98.6 F (37 C) 98.5 F (36.9 C)  TempSrc: Oral  Oral Oral  SpO2: 94% 92% 94% 94%  Weight:      Height:       Neurology awake and alert ENT with mild pallor with no icterus Cardiovascular with S1 and S2 present and regular with no gallops, rubs or murmurs Respiratory with mild rales at bases with no wheezing or rhonchi, poor inspiratory effort Abdomen protuberant with no distention  Bilateral BKA with positive peripheral edema +++ pitting   Data Reviewed:    Family Communication: no family at the bedside   Disposition: Status is: Inpatient Remains inpatient appropriate because: IV diuresis   Planned Discharge Destination: Home    Author: Elidia Toribio Furnace, MD 12/06/2023 3:31 PM  For on call review [email or web address].

## 2023-12-06 NOTE — Assessment & Plan Note (Signed)
 Calculated BMI is 42.2

## 2023-12-06 NOTE — Plan of Care (Signed)

## 2023-12-06 NOTE — Assessment & Plan Note (Signed)
 His glucose remained well controlled during his hospitalization.   Fasting glucose today 93 mg/dl

## 2023-12-06 NOTE — Assessment & Plan Note (Signed)
 Hyperkalemia. Nephrotic range proteinuria   Renal function with serum cr at 1.60 with K at 4.1 and serum bicarbonate at 26 Na 139 and Mg 1.8   Add 2 g Mag sulfate to prevent hypomagnesemia.  (Patient allergic to furosemide )  Hold on loop diuretic pm dose.  Follow up renal function in am.

## 2023-12-06 NOTE — Assessment & Plan Note (Signed)
 Continue blood pressure control.

## 2023-12-06 NOTE — Assessment & Plan Note (Signed)
 Echocardiogram with reduced LV systolic function EF 35 to 40%, global hypokinesis, LV internal cavity with severe dilatation, moderate LVH, RV systolic function with mild reduction, RV with moderate enlargement, no significant valvular disease. LA and RA with normal size.   Urine output is 5,300 ml Systolic blood pressure 150 mmHg range.   Hold pm dose of bumetanide , possible transition to po in am.  RAAS inhibition with low dose spironolactone  and low dose losartan . Further afterload reduction with hydralazine  and isosorbide .    Possible resumption entresto  if renal function stable.  Due to body habitus not candidate for SGLT 2 inh at this point in time.

## 2023-12-07 DIAGNOSIS — I10 Essential (primary) hypertension: Secondary | ICD-10-CM | POA: Diagnosis not present

## 2023-12-07 DIAGNOSIS — I739 Peripheral vascular disease, unspecified: Secondary | ICD-10-CM | POA: Diagnosis not present

## 2023-12-07 DIAGNOSIS — I5033 Acute on chronic diastolic (congestive) heart failure: Secondary | ICD-10-CM | POA: Diagnosis not present

## 2023-12-07 DIAGNOSIS — N182 Chronic kidney disease, stage 2 (mild): Secondary | ICD-10-CM | POA: Diagnosis not present

## 2023-12-07 LAB — BASIC METABOLIC PANEL WITH GFR
Anion gap: 7 (ref 5–15)
BUN: 20 mg/dL (ref 6–20)
CO2: 26 mmol/L (ref 22–32)
Calcium: 7.5 mg/dL — ABNORMAL LOW (ref 8.9–10.3)
Chloride: 106 mmol/L (ref 98–111)
Creatinine, Ser: 1.6 mg/dL — ABNORMAL HIGH (ref 0.61–1.24)
GFR, Estimated: 53 mL/min — ABNORMAL LOW (ref >60)
Glucose, Bld: 143 mg/dL — ABNORMAL HIGH (ref 70–99)
Potassium: 4.1 mmol/L (ref 3.5–5.1)
Sodium: 139 mmol/L (ref 135–145)

## 2023-12-07 LAB — MAGNESIUM: Magnesium: 1.8 mg/dL (ref 1.7–2.4)

## 2023-12-07 LAB — PATHOLOGIST SMEAR REVIEW

## 2023-12-07 MED ORDER — HYDRALAZINE HCL 50 MG PO TABS
50.0000 mg | ORAL_TABLET | Freq: Three times a day (TID) | ORAL | Status: DC
  Administered 2023-12-07 – 2023-12-08 (×4): 50 mg via ORAL
  Filled 2023-12-07 (×4): qty 1

## 2023-12-07 MED ORDER — MAGNESIUM SULFATE 2 GM/50ML IV SOLN
2.0000 g | Freq: Once | INTRAVENOUS | Status: AC
  Administered 2023-12-07: 2 g via INTRAVENOUS
  Filled 2023-12-07: qty 50

## 2023-12-07 MED ORDER — ISOSORBIDE MONONITRATE ER 30 MG PO TB24
30.0000 mg | ORAL_TABLET | Freq: Every day | ORAL | Status: DC
  Administered 2023-12-07 – 2023-12-08 (×2): 30 mg via ORAL
  Filled 2023-12-07 (×2): qty 1

## 2023-12-07 NOTE — Plan of Care (Signed)

## 2023-12-07 NOTE — Progress Notes (Signed)
 1800 patient alert able to make all needs known 1L [place] removed patient SP02 was 100% wife at bedside helps patient with complete ADL care. Patient states he only get up out of bed for appointments only.

## 2023-12-07 NOTE — Progress Notes (Addendum)
 Progress Note   Patient: Alan Tapia. FMW:[id number] DOB: [birth year] DOA: 12/03/2023     4 DOS: the patient was seen and examined on 12/07/2023   Brief hospital course: Mr. Alan Tapia was admitted to the hospital with the working diagnosis of heart failure exacerbation.   48 year old with history heart failure, peripheral arterial disease, type 2 diabetes, hypertension, bilateral BKA, neuropathy, and obesity presented with progressive dyspnea over the last few weeks. Worse for 1 day. Positive for orthopnea and anasarca. Patient non compliant with his medications.  He was also scheduled for thoracentesis as outpatient at [facility]. On his initial physical examination his blood pressure was 185/123, HR 108, 02 saturation 95%, lungs with decreased breath sounds and increased work of breathing, heart with S1 and S2 present and regular with no gallops or rubs, abdomen protuberant with no tenderness, positive lower extremity edema +++ pitting, bilateral BKA.   Na 138, K 5.2 Cl 107 bicarbonate 24 glucose 93 bun 24 cr 1.61 AST 15 ALT 11  BNP 898  High sensitive troponin 10 and 11 Wc 9,5 hgb 12.3 plt 433  Urine analysis SG 1,016, protein > 300, glucose 50, negative leukocytes and negative hgb.   Chest radiograph with cardiomegaly, bilateral hilar vascular congestion with central interstitial infiltrates, moderate left pleural effusion.   EKG 103 bpm, left axis deviation, normal intervals, qtc 476, sinus rhythm with no significant ST segment or T wave changes. Low voltage.   Patient was placed on furosemide  for diuresis.   07/25 left thoracentesis, 1.9 L removed, clear yellow fluid.     Assessment and Plan: * Acute on chronic diastolic CHF (congestive heart failure) (HCC) Echocardiogram with reduced LV systolic function EF 35 to 40%, global hypokinesis, LV internal cavity with severe dilatation, moderate LVH, RV systolic function with mild reduction, RV with moderate enlargement, no  significant valvular disease. LA and RA with normal size.   Urine output is 5,300 ml Systolic blood pressure 150 mmHg range.   Hold pm dose of bumetanide , possible transition to po in am.  RAAS inhibition with low dose spironolactone  and low dose losartan . Further afterload reduction with hydralazine  and isosorbide .    Possible resumption entresto  if renal function stable.  Due to body habitus not candidate for SGLT 2 inh at this point in time.   Essential hypertension Hold on amlodipine  and clonidine  Continue losartan  and add isosorbide / hydralazine .  Spironolactone .  If renal function stable, possible transition to entresto   Follow up on blood pressure  CKD (chronic kidney disease), stage II Hyperkalemia. Nephrotic range proteinuria   Renal function with serum cr at 1.60 with K at 4.1 and serum bicarbonate at 26 Na 139 and Mg 1.8   Add 2 g Mag sulfate to prevent hypomagnesemia.  (Patient allergic to furosemide )  Hold on loop diuretic pm dose.  Follow up renal function in am.   PVD (peripheral vascular disease) (HCC) Continue blood pressure control   Insulin  dependent type 2 diabetes mellitus (HCC) Glucose has been controlled, currently off insulin  therapy.  Fasting glucose today 143 mg/dl   Obesity, class 3 Calculated BMI is 42.2        Subjective: Patient with no chest pain, dyspnea and edema continue to improve. No chest pain   Physical Exam: Vitals:   12/07/23 1156 12/07/23 1200 12/07/23 1500 12/07/23 1523  BP: (!) 160/79 (!) 160/79  (!) 175/73  Pulse: 81 78  88  Resp: 18  20 20   Temp: 97.7 F (36.5 C)  97.9 F (36.6 C)  TempSrc: Oral   Oral  SpO2: 98% 98%  96%  Weight:      Height:       Neurology awake and alert ENT with mild pallor Cardiovascular with S1 and S2 present and regular with no gallops rubs or murmurs Respiratory with no wheezing or rales, on anterior upper auscultation  Abdomen protuberant but not distended and non  tender Peripheral edema has been improving, positive + at the thighs.  Bilateral BKA  Data Reviewed:    Family Communication: I spoke with patient's wife at the bedside, we talked in detail about patient's condition, plan of care and prognosis and all questions were addressed.   Disposition: Status is: Inpatient Remains inpatient appropriate because: recovering heart failure   Planned Discharge Destination: Home    Author: Elidia Toribio Furnace, MD 12/07/2023 3:29 PM  For on call review [email or web address].

## 2023-12-07 NOTE — Progress Notes (Signed)
 Physical Therapy Treatment Patient Details Name: Alan Tapia. MRN: [id number] DOB: 08-01-1975 Today's Date: 12/07/2023   History of Present Illness 48 y.o male admitted 12/03/23 for SOB, Lt pleural effusion, acute on chronic CHF. 7/25 thoracentesis. PMH: bil BKA, CHF, CKD, PVD, T2DM, HTN, obesity    PT Comments  Pt with flat affect, willing to attempt EOB and donning prostheses. Wife dons prostheses and pt has particular sequence and bed positioning to achieve this and transition to siting. Pt states he does not transfer to [facility] daily but only when going out for appointments, he is open to but not fully committed to attempting standing acutely and may defer to OP clinic. Pt educated for bed mobility, using rail to work on repeated trunk pull ups and chair push ups to strengthen UB as well as SLR, LAQ and hip Abduct/add. Pt with tendency to hyperventilate in supine with SPO2 >90% on RA and cues for breathing technique, calming/anxiety reduction and sequence.  Will continue to follow and encouraged shrinkers from home to assist with prostheses fit as unable to get in socket this session.    If plan is discharge home, recommend the following: A lot of help with walking and/or transfers;A lot of help with bathing/dressing/bathroom;Assistance with cooking/housework;Assist for transportation   Can travel by private vehicle        Equipment Recommendations  None recommended by PT    Recommendations for Other Services       Precautions / Restrictions Precautions Precautions: Fall Recall of Precautions/Restrictions: Intact Precaution/Restrictions Comments: bil BKA     Mobility  Bed Mobility Overal bed mobility: Needs Assistance Bed Mobility: Supine to Sit, Sit to Supine     Supine to sit: HOB elevated, Used rails, Min assist Sit to supine: Min assist   General bed mobility comments: min assist to move legs to EOB with HOB 35 degrees, reliance on rail to pull self to sitting EOB. return  to supine with min assist to lift legs. Attempted scooting laterally at EOB with minimal distance gained and cues for sequence. pt performed 1 posterior scoot in long sitting. to return to [facility] pt goes into sidelying then pulls toward HOB. Min assist to roll bil for pericare and pad change due to stool incontinence    Transfers                   General transfer comment: EOB with prostheses donned but denied being able to attempt standing or lateral scoot to chair    Ambulation/Gait                   Stairs             Wheelchair Mobility     Tilt Bed    Modified Rankin (Stroke Patients Only)       Balance Overall balance assessment: Needs assistance Sitting-balance support: No upper extremity supported, Feet unsupported Sitting balance-Leahy Scale: Fair                                      Hotel manager: No apparent difficulties  Cognition Arousal: Alert Behavior During Therapy: Flat affect   PT - Cognitive impairments: No apparent impairments                       PT - Cognition Comments: pt with decreased awareness of deficits and has particular ways  of moving and has difficulty following commands for varied techniques Following commands: Impaired Following commands impaired: Only follows one step commands consistently    Cueing Cueing Techniques: Verbal cues  Exercises General Exercises - Lower Extremity Long Arc Quad: AROM, Both, 5 reps, Seated    General Comments        Pertinent Vitals/Pain Pain Assessment Pain Score: 4  Pain Location: chest Pain Descriptors / Indicators: Aching Pain Intervention(s): Limited activity within patient's tolerance, Monitored during session, Repositioned    Home Living                          Prior Function            PT Goals (current goals can now be found in the care plan section) Progress towards PT goals: Progressing toward  goals    Frequency    Min 2X/week      PT Plan      Co-evaluation              AM-PAC PT 6 Clicks Mobility   Outcome Measure  Help needed turning from your back to your side while in a flat bed without using bedrails?: A Little Help needed moving from lying on your back to sitting on the side of a flat bed without using bedrails?: A Little Help needed moving to and from a bed to a chair (including a wheelchair)?: Total Help needed standing up from a chair using your arms (e.g., wheelchair or bedside chair)?: Total Help needed to walk in hospital room?: Total Help needed climbing 3-5 steps with a railing? : Total 6 Click Score: 10    End of Session   Activity Tolerance: Patient tolerated treatment well Patient left: in bed;with call bell/phone within reach;with family/visitor present Nurse Communication: Mobility status;Need for lift equipment PT Visit Diagnosis: Other abnormalities of gait and mobility (R26.89);Muscle weakness (generalized) (M62.81);Pain     Time: 0833-0909 PT Time Calculation (min) (ACUTE ONLY): 36 min  Charges:    $Therapeutic Activity: 23-37 mins PT General Charges $$ ACUTE PT VISIT: 1 Visit                     Lenoard SQUIBB, PT Acute Rehabilitation Services Office: [phone]    Lenoard NOVAK Masaye Gatchalian 12/07/2023, 10:52 AM

## 2023-12-07 NOTE — Progress Notes (Signed)
 Heart Failure Navigator Progress Note  Assessed for Heart & Vascular TOC clinic readiness.  Patient does not meet criteria due to patient and his wife verbalized that they have a PCP appointment on 12/16/2023. Patient stated if the PCP thinks he should see a cardiologist then they will call the Heart Failure clinic to schedule a appointment. A HF TOC appointment with all the contact information was given to the patient.   Navigator available for reassessment of patient.   Stephane Haddock, BSN, Scientist, clinical (histocompatibility and immunogenetics) Only

## 2023-12-07 NOTE — Progress Notes (Signed)
 [name]   PCP: Patient, No Pcp Per PCP-Cardiologist: Powell FORBES Sorrow, MD (Inactive)    HPI:  48 yo M with PMH of CHF, diabetes, HTN, morbid obesity, bilateral BKA, CKD, and PVD.   Presented to the ED on 7/24 with shortness of breath, orthopnea, edema in legs and abdomen, and chest tightness. Significant issues with medication adherence and social barriers (cancelling appts). CXR with moderate interstitial edema and large left pleural effusion s/p thoracentesis on 7/25 yielding 1.9L of pleural fluid. Repeat CXR showed moderate bilateral pleural effusions. BNP elevated. ECHO 7/25 with LVEF 35-40% (30% in 05/2023 and 35-40% in 08/2022), global hypokinesis, RV mildly reduced.   Met with patient and his wife at bedside. Denies shortness of breath at rest and on some exertion. States that certain movements make him feel like he needs to catch his breath more than others. Able to lay flat comfortably. States his abdominal and leg edema has improved. Reviewed GDMT and goals of therapy. Does not recall any issues tolerating the metoprolol  in the past. States that lasix  is the reason why he lost his right leg. Agreeable to using [facility] TOC pharmacy at discharge and converting to [facility] mail order pharmacy for refills.   Current HF Medications: Diuretic: bumetanide  2 mg IV BID ACE/ARB/ARNI: losartan  25 mg daily MRA: spironolactone  12.5 mg daily *also on clonidine  0.1 mg BID  Prior to admission HF Medications: ACE/ARB/ARNI: Entresto  97/103 mg BID *also on amlodipine  5 mg daily, clonidine  0.1 mg BID *dispense records indicate noncompliance (last fills in March for 30 days or less)  Pertinent Lab Values: Serum creatinine 1.48>1.60, BUN 20, Potassium 4.1, Sodium 139, BNP 898, Magnesium  1.8, A1c 4.6   Vital Signs: Weight: 344 lbs (admission weight: 365 lbs) Blood pressure: 170-180/90s  Heart rate: 70-80s  I/O: net -4.8L yesterday; net -12.5L since  admission  Medication Assistance / Insurance Benefits Check: Does the patient have prescription insurance?  Yes Type of insurance plan: [place] Medicaid  Outpatient Pharmacy:  Prior to admission outpatient pharmacy: [facility] Is the patient willing to use [facility] TOC pharmacy at discharge? Yes Is the patient willing to transition their outpatient pharmacy to utilize a [facility] outpatient pharmacy?   Yes - interested in mail order    Assessment: 1. Acute on chronic systolic CHF (LVEF 35-40%). NYHA class II symptoms. - On bumetanide  2 mg IV BID - consider transitioning to PO. Intolerance to lasix  reported with hives and states that it caused him to lose his right leg. Strict I/Os and daily weights. Keep K>4 and Mg>2. Recommend magnesium  2g IV x 1 for replacement. - Intolerant to carvedilol  in the past. Was transitioned to metoprolol  XL. Reports he was not taking this PTA. Per patient, does not recall any issues with tolerating this medication. Consider restarting this tomorrow.  - Losartan  25 mg daily started - first dose given this AM. Could transition to Entresto  49/51 mg BID tomorrow since BP remains elevated.  - Consider increasing to spironolactone  25 mg daily today - No SGLT2i - poor candidate with body habitus - Would stop clonidine  and transition to hydralazine /Imdur  for better BP control   Plan: 1) Medication changes recommended at this time: - Increase spironolactone  to 25 mg daily - Transition from IV to PO bumex  - Magnesium  2g IV x 1 - Stop losartan  and clonidine  - Start Entresto  49/51 mg BID - Start hydralazine  50 mg TID and Imdur  30 mg daily  2) Patient assistance: - None pending, has  [place] Medicaid  3)  Education  - Initial education completed - Full education to be completed prior to discharge  Duwaine Plant, PharmD, BCPS Heart Failure Stewardship Pharmacist Phone [phone]

## 2023-12-08 ENCOUNTER — Other Ambulatory Visit (HOSPITAL_COMMUNITY): Payer: Self-pay

## 2023-12-08 DIAGNOSIS — N182 Chronic kidney disease, stage 2 (mild): Secondary | ICD-10-CM | POA: Diagnosis not present

## 2023-12-08 DIAGNOSIS — I739 Peripheral vascular disease, unspecified: Secondary | ICD-10-CM | POA: Diagnosis not present

## 2023-12-08 DIAGNOSIS — I5033 Acute on chronic diastolic (congestive) heart failure: Secondary | ICD-10-CM | POA: Diagnosis not present

## 2023-12-08 DIAGNOSIS — I10 Essential (primary) hypertension: Secondary | ICD-10-CM | POA: Diagnosis not present

## 2023-12-08 LAB — BASIC METABOLIC PANEL WITH GFR
Anion gap: 3 — ABNORMAL LOW (ref 5–15)
BUN: 18 mg/dL (ref 6–20)
CO2: 28 mmol/L (ref 22–32)
Calcium: 7.5 mg/dL — ABNORMAL LOW (ref 8.9–10.3)
Chloride: 105 mmol/L (ref 98–111)
Creatinine, Ser: 1.38 mg/dL — ABNORMAL HIGH (ref 0.61–1.24)
GFR, Estimated: >60 mL/min (ref >60)
Glucose, Bld: 93 mg/dL (ref 70–99)
Potassium: 4 mmol/L (ref 3.5–5.1)
Sodium: 136 mmol/L (ref 135–145)

## 2023-12-08 LAB — MAGNESIUM: Magnesium: 1.7 mg/dL (ref 1.7–2.4)

## 2023-12-08 MED ORDER — LOSARTAN POTASSIUM 25 MG PO TABS: 25.0000 mg | ORAL_TABLET | Freq: Every day | ORAL | Status: DC

## 2023-12-08 MED ORDER — BUMETANIDE 1 MG PO TABS
1.0000 mg | ORAL_TABLET | Freq: Every day | ORAL | 0 refills | Status: DC
  Filled 2023-12-08: qty 60, 60d supply, fill #0

## 2023-12-08 MED ORDER — PANTOPRAZOLE SODIUM 40 MG PO TBEC
40.0000 mg | DELAYED_RELEASE_TABLET | Freq: Every day | ORAL | 0 refills | Status: DC
Start: 2023-12-08 — End: 2024-01-15

## 2023-12-08 MED ORDER — HYDRALAZINE HCL 50 MG PO TABS
50.0000 mg | ORAL_TABLET | Freq: Three times a day (TID) | ORAL | 0 refills | Status: AC
  Filled 2023-12-08: qty 90, 30d supply, fill #0

## 2023-12-08 MED ORDER — LOSARTAN POTASSIUM 25 MG PO TABS
25.0000 mg | ORAL_TABLET | Freq: Every day | ORAL | 0 refills | Status: DC
  Filled 2023-12-08: qty 30, 30d supply, fill #0

## 2023-12-08 MED ORDER — SPIRONOLACTONE 25 MG PO TABS
12.5000 mg | ORAL_TABLET | Freq: Every day | ORAL | 0 refills | Status: DC
  Filled 2023-12-08: qty 15, 30d supply, fill #0

## 2023-12-08 MED ORDER — METOPROLOL SUCCINATE ER 25 MG PO TB24
12.5000 mg | ORAL_TABLET | Freq: Every day | ORAL | 0 refills | Status: DC
  Filled 2023-12-08: qty 15, 30d supply, fill #0

## 2023-12-08 MED ORDER — BUMETANIDE 1 MG PO TABS
1.0000 mg | ORAL_TABLET | Freq: Every day | ORAL | Status: DC
  Filled 2023-12-08: qty 1

## 2023-12-08 MED ORDER — SACUBITRIL-VALSARTAN 24-26 MG PO TABS
1.0000 | ORAL_TABLET | Freq: Two times a day (BID) | ORAL | 0 refills | Status: DC
  Filled 2023-12-08: qty 60, 30d supply, fill #0

## 2023-12-08 MED ORDER — ACETAMINOPHEN 325 MG PO TABS: 650.0000 mg | ORAL_TABLET | Freq: Four times a day (QID) | ORAL | Status: DC | PRN

## 2023-12-08 MED ORDER — METOPROLOL SUCCINATE ER 25 MG PO TB24
12.5000 mg | ORAL_TABLET | Freq: Every day | ORAL | Status: DC
  Administered 2023-12-08: 12.5 mg via ORAL
  Filled 2023-12-08: qty 1

## 2023-12-08 MED ORDER — MAGNESIUM SULFATE 2 GM/50ML IV SOLN
2.0000 g | Freq: Once | INTRAVENOUS | Status: AC
  Administered 2023-12-08: 2 g via INTRAVENOUS
  Filled 2023-12-08: qty 50

## 2023-12-08 MED ORDER — ISOSORBIDE MONONITRATE ER 30 MG PO TB24
30.0000 mg | ORAL_TABLET | Freq: Every day | ORAL | 0 refills | Status: DC
  Filled 2023-12-08: qty 30, 30d supply, fill #0

## 2023-12-08 MED ORDER — SACUBITRIL-VALSARTAN 24-26 MG PO TABS: 1.0000 | ORAL_TABLET | Freq: Two times a day (BID) | ORAL | Status: DC

## 2023-12-08 NOTE — Plan of Care (Signed)
 Patient discharging home with spouse via ptar.

## 2023-12-08 NOTE — Progress Notes (Signed)
 [name]   PCP: Patient, No Pcp Per PCP-Cardiologist: Powell FORBES Sorrow, MD (Inactive)    HPI:  48 yo M with PMH of CHF, diabetes, HTN, morbid obesity, bilateral BKA, CKD, and PVD.   Presented to the ED on 7/24 with shortness of breath, orthopnea, edema in legs and abdomen, and chest tightness. Significant issues with medication adherence and social barriers (cancelling appts). CXR with moderate interstitial edema and large left pleural effusion s/p thoracentesis on 7/25 yielding 1.9L of pleural fluid. Repeat CXR showed moderate bilateral pleural effusions. BNP elevated. ECHO 7/25 with LVEF 35-40% (30% in 05/2023 and 35-40% in 08/2022), global hypokinesis, RV mildly reduced.   Met with patient and his wife at bedside. Denies shortness of breath. Complains of fatigue today but states he just got a dose of oxycodone . Able to lay flat comfortably. Reviewed updates to GDMT and goals of therapy. Does not recall any issues tolerating the metoprolol  in the past. States that lasix  is the reason why he lost his right leg. Agreeable to using [facility] TOC pharmacy at discharge and converting to [facility] mail order pharmacy for refills.   Current HF Medications: ACE/ARB/ARNI: losartan  25 mg daily MRA: spironolactone  12.5 mg daily Other: hydralazine  50 mg TID + Imdur  30 mg daily  Prior to admission HF Medications: ACE/ARB/ARNI: Entresto  97/103 mg BID *also on amlodipine  5 mg daily, clonidine  0.1 mg BID *dispense records indicate noncompliance (last fills in March for 30 days or less)  Pertinent Lab Values: Serum creatinine 1.48>1.60>1.38, BUN 18, Potassium 4.0, Sodium 136, BNP 898, Magnesium  1.7, A1c 4.6   Vital Signs: Weight: 351 lbs (admission weight: 365 lbs) - bed weights Blood pressure: 150/70s  Heart rate: 70-90s  I/O: net -3.8L yesterday; net -14.6L since admission  Medication Assistance / Insurance Benefits Check: Does the patient have prescription  insurance?  Yes Type of insurance plan: [place] Medicaid  Outpatient Pharmacy:  Prior to admission outpatient pharmacy: [facility] Is the patient willing to use [facility] TOC pharmacy at discharge? Yes Is the patient willing to transition their outpatient pharmacy to utilize a [facility] outpatient pharmacy?   Yes - interested in mail order    Assessment: 1. Acute on chronic systolic CHF (LVEF 35-40%). NYHA class II symptoms. - Now off IV bumex  - consider transitioning to PO. Intolerance to lasix  reported with hives and states that it caused him to lose his right leg. Strict I/Os and daily weights. Keep K>4 and Mg>2. Recommend magnesium  2g IV x 1 for replacement. - Intolerant to carvedilol  in the past. Was transitioned to metoprolol  XL. Reports he was not taking this PTA. Per patient, does not recall any issues with tolerating this medication. Consider restarting this today. - Losartan  25 mg daily started. Consider transition to Entresto  49/51 mg BID since BP remains elevated.  - Consider increasing to spironolactone  25 mg daily - No SGLT2i - poor candidate with body habitus - Continue hydralazine  50 mg TID + Imdur  30 mg daily   Plan: 1) Medication changes recommended at this time: - Increase spironolactone  to 25 mg daily - Magnesium  2g IV x 1 - Stop losartan  - Start Entresto  49/51 mg BID - Start metoprolol  XL 25 mg daily - Bumex  at discharge  2) Patient assistance: - None pending, has [place] Medicaid  3)  Education  - Patient has been educated on current HF medications and potential additions to HF medication regimen - Patient verbalizes understanding that over the next few months, these medication doses  may change and more medications may be added to optimize HF regimen - Patient has been educated on basic disease state pathophysiology and goals of therapy   Alan Tapia, PharmD, BCPS Heart Failure Stewardship Pharmacist Phone [phone]

## 2023-12-08 NOTE — Progress Notes (Signed)
 Patient called out and complained that he thought he was having an allergic reaction to one of his meds. Patient thinks it is from the iv Magnesium . No rash noted. Patient arms were swollen with some edema bilaterally before starting iv magnesium . Right arm more swollen than the left. MD notified.

## 2023-12-08 NOTE — Progress Notes (Signed)
 OT Cancellation Note  Patient Details Name: Alan Tapia. MRN: [id number] DOB: 01-May-1976   Cancelled Treatment:    Reason Eval/Treat Not Completed: Pain limiting ability to participate (Nursing was made aware and will follow up.)  Avyan Livesay K OTR/L  Acute Rehab Services  [phone] office number   Alan Tapia 12/08/2023, 8:04 AM

## 2023-12-08 NOTE — Progress Notes (Signed)
 OT Cancellation Note  Patient Details Name: Alan Tapia. MRN: [id number] DOB: February 12, 1976   Cancelled Treatment:    Reason Eval/Treat Not Completed: Other (comment) (On the second attempt pt was calling out to nursing as reported edma in BUE and having an allergic reaction. Pt then on the third attempt reported waiting to see the md. Will continue to follow.) Yerik Zeringue K OTR/L  Acute Rehab Services  [phone] office number   Warrick Berber 12/08/2023, 11:43 AM

## 2023-12-08 NOTE — Plan of Care (Signed)
 Patient remains edematous in upper arms bilaterally MD aware. Patient has lost significant amount of fluid weight this admission.

## 2023-12-08 NOTE — Discharge Summary (Addendum)
 Physician Discharge Summary   Patient: Alan Tapia. MRN: [id number] DOB: 1975/07/09  Admit date:     12/03/2023  Discharge date: 12/08/23  Discharge Physician: Alan Tapia   PCP: Patient, No Pcp Per   Recommendations at discharge:    Patient has been placed on heart failure guideline directed medical therapy with losartan , spironolactone  and metoprolol  succinate. Further afterload reduction with hydralazine  and isosorbide . (Entresto  not covered by his insurance)  Discontinue clonidine  and amlodipine  Continue diuresis with bumetanide   Follow up with Heart Failure clinic.   Discharge Diagnoses: Principal Problem:   Acute on chronic diastolic CHF (congestive heart failure) (HCC) Active Problems:   Essential hypertension   CKD (chronic kidney disease), stage II   PVD (peripheral vascular disease) (HCC)   Insulin  dependent type 2 diabetes mellitus (HCC)   Obesity, class 3  Resolved Problems:   * No resolved hospital problems. [facility] Course: Alan Tapia was admitted to the hospital with the working diagnosis of heart failure exacerbation.   48 year old with history heart failure, peripheral arterial disease, type 2 diabetes, hypertension, bilateral BKA, neuropathy, and obesity presented with progressive dyspnea over the last few weeks. Worse for 1 day. Positive for orthopnea and anasarca. Patient non compliant with his medications.  He was also scheduled for thoracentesis as outpatient at [facility]. On his initial physical examination his blood pressure was 185/123, HR 108, 02 saturation 95%, lungs with decreased breath sounds and increased work of breathing, heart with S1 and S2 present and regular with no gallops or rubs, abdomen protuberant with no tenderness, positive lower extremity edema +++ pitting, bilateral BKA.   Na 138, K 5.2 Cl 107 bicarbonate 24 glucose 93 bun 24 cr 1.61 AST 15 ALT 11  BNP 898  High sensitive troponin 10 and 11 Wc 9,5 hgb  12.3 plt 433  Urine analysis SG 1,016, protein > 300, glucose 50, negative leukocytes and negative hgb.   Chest radiograph with cardiomegaly, bilateral hilar vascular congestion with central interstitial infiltrates, moderate left pleural effusion.   EKG 103 bpm, left axis deviation, normal intervals, qtc 476, sinus rhythm with no significant ST segment or T wave changes. Low voltage.   Patient was placed on furosemide  for diuresis.   07/25 left thoracentesis, 1.9 L removed, clear yellow fluid.  07/29 responded well to diuresis, patient will follow up with heart failure clinic as outpatient.    Assessment and Plan: * Acute on chronic systolic CHF (congestive heart failure) (HCC) Echocardiogram with reduced LV systolic function EF 35 to 40%, global hypokinesis, LV internal cavity with severe dilatation, moderate LVH, RV systolic function with mild reduction, RV with moderate enlargement, no significant valvular disease. LA and RA with normal size.   Patient was placed on IV bumetanide  for diuresis, negative fluid balance was achieved, -  14,566 ml, with significant improvement in his symptoms.   RAAS inhibition with low dose spironolactone  and resume low dose losartan . Further afterload reduction with hydralazine  and isosorbide .  B blockade with metoprolol  succinate low dose.   Continue diuresis with bumetanide  as outpatient.  Due to body habitus not candidate for SGLT 2 inh at this point in time.   Essential hypertension Hold on amlodipine  and clonidine  Continue losartan  and isosorbide / hydralazine  for afterload reduction.  Continue with spironolactone .   CKD (chronic kidney disease), stage II Hyperkalemia. Nephrotic range proteinuria   At the time of discharge his renal function has improved with serum cr at 1,38 from peak 1,61, his K is  4.0 and serum bicarbonate at 28  Na 136 Mg 1.7   Added 2 g Mag sulfate to prevent hypomagnesemia.  (Patient allergic to furosemide )  Resume  bumetanide  po and follow up renal function and electrolytes as outpatient.   PVD (peripheral vascular disease) (HCC) Continue blood pressure control   Insulin  dependent type 2 diabetes mellitus (HCC) His glucose remained well controlled during his hospitalization.   Fasting glucose today 93 mg/dl   Obesity, class 3 Calculated BMI is 42.2    Consultants: none  Procedures performed: thoracentesis Left  Disposition: Home Diet recommendation:  Cardiac and Carb modified diet DISCHARGE MEDICATION: Allergies as of 12/08/2023       Reactions   Fish Allergy Anaphylaxis, Hives, Other (See Comments)   Lasix  [furosemide ] Hives   Cefepime  Other (See Comments)   Redness of the neck and cheek.  Concern for rash   Coreg  [carvedilol ] Nausea Only   Fish-derived Products    Vancomycin  Hives   Redman's-type rash on 08/13/22        Medication List     STOP taking these medications    amLODipine  5 MG tablet Commonly known as: NORVASC    Cholecalciferol 125 MCG (5000 UT) capsule   Clear Eyes Cooling Comfort 0.5-0.03 % Soln Generic drug: Naphazoline-Glycerin   cloNIDine  0.1 MG tablet Commonly known as: CATAPRES    cyanocobalamin  1000 MCG tablet   hydrOXYzine  25 MG tablet Commonly known as: ATARAX    lidocaine  5 % Commonly known as: LIDODERM    Melatonin 10 MG Tabs   Metamucil 4 in 1 Fiber 55.6 % Powd Generic drug: Psyllium   multivitamin Tabs tablet   Muscle Rub 10-15 % Crea   Nutritional Shake Plus Protein Liqd   polyethylene glycol 17 g packet Commonly known as: MIRALAX  / GLYCOLAX    sacubitril -valsartan  97-103 MG Commonly known as: ENTRESTO    simethicone  80 MG chewable tablet Commonly known as: MYLICON   Vitamin K2 100 MCG Caps   Zinc  50 MG Tabs       TAKE these medications    acetaminophen  325 MG tablet Commonly known as: TYLENOL  Take 2 tablets (650 mg total) by mouth every 6 (six) hours as needed for mild pain (pain score 1-3) or headache.   bumetanide  1  MG tablet Commonly known as: BUMEX  Take 1 tablet (1 mg total) by mouth daily.   hydrALAZINE  50 MG tablet Commonly known as: APRESOLINE  Take 1 tablet (50 mg total) by mouth every 8 (eight) hours.   isosorbide  mononitrate 30 MG 24 hr tablet Commonly known as: IMDUR  Take 1 tablet (30 mg total) by mouth daily. Start taking on: December 09, 2023   losartan  25 MG tablet Commonly known as: COZAAR  Take 1 tablet (25 mg total) by mouth daily. Start taking on: December 09, 2023   metoprolol  succinate 25 MG 24 hr tablet Commonly known as: TOPROL -XL Take 0.5 tablets (12.5 mg total) by mouth daily.   pantoprazole  40 MG tablet Commonly known as: PROTONIX  Take 1 tablet (40 mg total) by mouth daily. What changed: when to take this   spironolactone  25 MG tablet Commonly known as: ALDACTONE  Take 0.5 tablets (12.5 mg total) by mouth daily. Start taking on: December 09, 2023        Follow-up Information     [facility] Follow up on 12/16/2023.   Why: Hospital follow up and to establish primary care scheduled for 12/16/2023 at1 pm with Dr.Betty Swaziland Contact information: [address] [place],  [name]  [address]  Main: [phone]               Discharge Exam: Filed Weights   12/06/23 0500 12/07/23 0500 12/08/23 0431  Weight: (!) 157.3 kg (!) 156.4 kg (!) 159.5 kg   BP (!) 156/71 (BP Location: Right Wrist)   Pulse 89   Temp 97.8 F (36.6 C) (Oral)   Resp 20   Ht 6' 4 (1.93 m)   Wt (!) 159.5 kg   SpO2 96%   BMI 42.80 kg/m   Patient is feeling better, dyspnea and edema have improved, no PND or orthopnea  Neurology awake and alert ENT with mild pallor Cardiovascular with S1 and S2 present and regular with no gallops or rubs, no murmurs Respiratory with no rales or wheezing, no rhonchi  Abdomen protuberant with no distention, soft and non tender Bilateral BKA, trace edema at the thighs.   Condition at discharge: stable  The results of  significant diagnostics from this hospitalization (including imaging, microbiology, ancillary and laboratory) are listed below for reference.   Imaging Studies: ECHOCARDIOGRAM COMPLETE Result Date: 12/04/2023    ECHOCARDIOGRAM REPORT   Patient Name:   Alan Tapia. Date of Exam: 12/04/2023 Medical Rec #:  [id number]          Height:       76.0 in Accession #:    [id number]         Weight:       282.6 lb Date of Birth:  [birth year]          BSA:          2.566 m Patient Age:    48 years           BP:           174/86 mmHg Patient Gender: M                  HR:           87 bpm. Exam Location:  Inpatient Procedure: 2D Echo, Cardiac Doppler and Color Doppler (Both Spectral and Color            Flow Doppler were utilized during procedure). Indications:    I50.40* Unspecified combined systolic (congestive) and diastolic                 (congestive) heart failure  History:        Patient has prior history of Echocardiogram examinations, most                 recent 05/15/2023. CHF and Cardiomyopathy,                 Signs/Symptoms:Bacteremia; Risk Factors:Diabetes and                 Hypertension.  Sonographer:    Ellouise Mose RDCS Referring Phys: [id number] [facility]  Sonographer Comments: Technically difficult study due to poor echo windows. Image acquisition challenging due to patient body habitus. Patient leaning on the bed rails on the left side of bed. Apicals medial due to patient nt bein able to move due to discomfort. IMPRESSIONS  1. Left ventricular ejection fraction, by estimation, is 35 to 40%. The left ventricle has moderately decreased function. The left ventricle demonstrates global hypokinesis. The left ventricular internal cavity size was severely dilated. There is moderate left ventricular hypertrophy. Indeterminate diastolic filling due to E-A fusion.  2. Right ventricular systolic function is mildly reduced. The right ventricular size is moderately enlarged. Mildly increased  right ventricular wall  thickness.  3. There is no evidence of cardiac tamponade. Large pleural effusion in both left and right lateral regions.  4. The mitral valve is normal in structure. Trivial mitral valve regurgitation. No evidence of mitral stenosis.  5. The aortic valve is tricuspid. Aortic valve regurgitation is not visualized. No aortic stenosis is present.  6. The inferior vena cava is dilated in size with <50% respiratory variability, suggesting right atrial pressure of 15 mmHg. FINDINGS  Left Ventricle: Left ventricular ejection fraction, by estimation, is 35 to 40%. The left ventricle has moderately decreased function. The left ventricle demonstrates global hypokinesis. The left ventricular internal cavity size was severely dilated. There is moderate left ventricular hypertrophy. Indeterminate diastolic filling due to E-A fusion. Right Ventricle: The right ventricular size is moderately enlarged. Mildly increased right ventricular wall thickness. Right ventricular systolic function is mildly reduced. Left Atrium: Left atrial size was normal in size. Right Atrium: Right atrial size was normal in size. Pericardium: Trivial pericardial effusion is present. The pericardial effusion appears to contain focal strands. There is no evidence of cardiac tamponade. Mitral Valve: The mitral valve is normal in structure. Trivial mitral valve regurgitation. No evidence of mitral valve stenosis. Tricuspid Valve: The tricuspid valve is normal in structure. Tricuspid valve regurgitation is trivial. No evidence of tricuspid stenosis. Aortic Valve: The aortic valve is tricuspid. Aortic valve regurgitation is not visualized. No aortic stenosis is present. Pulmonic Valve: The pulmonic valve was normal in structure. Pulmonic valve regurgitation is not visualized. No evidence of pulmonic stenosis. Aorta: The aortic root and ascending aorta are structurally normal, with no evidence of dilitation. Venous: The inferior vena cava is dilated in size with  less than 50% respiratory variability, suggesting right atrial pressure of 15 mmHg. IAS/Shunts: No atrial level shunt detected by color flow Doppler. Additional Comments: There is a large pleural effusion in both left and right lateral regions.  LEFT VENTRICLE PLAX 2D LVIDd:         5.60 cm      Diastology LVIDs:         4.70 cm      LV e' medial:    3.48 cm/s LV PW:         1.60 cm      LV E/e' medial:  23.8 LV IVS:        1.50 cm      LV e' lateral:   16.40 cm/s LVOT diam:     2.30 cm      LV E/e' lateral: 5.0 LV SV:         64 LV SV Index:   25 LVOT Area:     4.15 cm  LV Volumes (MOD) LV vol d, MOD A2C: 189.0 ml LV vol d, MOD A4C: 183.0 ml LV vol s, MOD A2C: 119.0 ml LV vol s, MOD A4C: 94.0 ml LV SV MOD A2C:     70.0 ml LV SV MOD A4C:     183.0 ml LV SV MOD BP:      82.9 ml IVC IVC diam: 2.50 cm LEFT ATRIUM             Index        RIGHT ATRIUM           Index LA diam:        4.30 cm 1.68 cm/m   RA Area:     21.60 cm LA Vol (A2C):   31.0 ml 12.08 ml/m  RA  Volume:   75.50 ml  29.42 ml/m LA Vol (A4C):   27.6 ml 10.76 ml/m LA Biplane Vol: 30.3 ml 11.81 ml/m  AORTIC VALVE LVOT Vmax:   88.00 cm/s LVOT Vmean:  58.200 cm/s LVOT VTI:    0.155 m  AORTA Ao Root diam: 3.20 cm Ao Asc diam:  3.00 cm MITRAL VALVE MV Area (PHT): 4.26 cm    SHUNTS MV Decel Time: 178 msec    Systemic VTI:  0.16 m MV E velocity: 82.80 cm/s  Systemic Diam: 2.30 cm MV A velocity: 67.05 cm/s MV E/A ratio:  1.23 Morene Brownie Electronically signed by Morene Brownie Signature Date/Time: 12/04/2023/3:46:56 PM    Final    DG Chest 1 View Result Date: 12/04/2023 CLINICAL DATA:  Pleural effusion. EXAM: CHEST  1 VIEW COMPARISON:  Chest radiograph dated 12/03/2023 FINDINGS: Shallow inspiration. Moderate bilateral pleural effusions with bibasilar atelectasis. Pneumonia is not excluded. No pneumothorax. Stable cardiac silhouette. No acute osseous pathology. IMPRESSION: Moderate bilateral pleural effusions with bibasilar atelectasis. Electronically  Signed   By: Vanetta Chou M.D.   On: 12/04/2023 13:18   IR THORACENTESIS ASP PLEURAL SPACE W/IMG GUIDE Result Date: 12/04/2023 INDICATION: Inpatient with large left pleural effusion with request for diagnostic and therapeutic thoracentesis. History of HF with progressive SOB over past few weeks. EXAM: ULTRASOUND GUIDED LEFT THORACENTESIS MEDICATIONS: 9 mL 1% lidocaine  COMPLICATIONS: None immediate. PROCEDURE: An ultrasound guided thoracentesis was thoroughly discussed with the patient and questions answered. The benefits, risks, alternatives and complications were also discussed. The patient understands and wishes to proceed with the procedure. Written consent was obtained. Ultrasound was performed to localize and mark an adequate pocket of fluid in the left chest. The area was then prepped and draped in the normal sterile fashion. 1% Lidocaine  was used for local anesthesia. Under ultrasound guidance a 6 Fr Skater Safe-T-Centesis catheter was introduced. Thoracentesis was performed. The catheter was removed and a dressing applied. FINDINGS: A total of approximately 1.9 liters of clear, yellow fluid was removed. Samples were sent to the laboratory as requested by the clinical team. IMPRESSION: Successful ultrasound guided left thoracentesis yielding 1.9 liters of pleural fluid. Performed and dictated by Laymon Coast, NP Electronically Signed   By: Cordella Banner   On: 12/04/2023 12:59   DG Chest 1 View Result Date: 12/03/2023 EXAM: 1 VIEW XRAY OF THE CHEST 12/03/2023 09:31:46 PM COMPARISON: 06/19/2023 CLINICAL HISTORY: shob. Best possible pt unable to lay flat and techs had to hold.had to use extreme angles FINDINGS: LUNGS AND PLEURA: Moderate interstitial edema. Large left pleural effusion, increased. HEART AND MEDIASTINUM: No acute abnormality of the cardiac and mediastinal silhouettes. BONES AND SOFT TISSUES: No acute osseous abnormality. IMPRESSION: 1. Moderate interstitial edema. 2. Large left  pleural effusion, increased. Electronically signed by: Pinkie Pebbles MD 12/03/2023 09:38 PM EDT RP Workstation: [id number]    Microbiology: Results for orders placed or performed during the hospital encounter of 04/06/23  Culture, blood (Routine x 2)     Status: None   Collection Time: 04/06/23 10:20 PM   Specimen: BLOOD  Result Value Ref Range Status   Specimen Description   Final    BLOOD LEFT ANTECUBITAL Performed at [facility], [address] [place], [name] [address]    Special Requests   Final    BOTTLES DRAWN AEROBIC AND ANAEROBIC Blood Culture adequate volume Performed at [facility], [address] [place], [name] [address]    Culture   Final  NO GROWTH 5 DAYS Performed at [facility], [address], [place], [name] [address]    Report Status 04/11/2023 FINAL  Final  Culture, blood (Routine x 2)     Status: None   Collection Time: 04/06/23 10:32 PM   Specimen: BLOOD  Result Value Ref Range Status   Specimen Description   Final    BLOOD BLOOD LEFT FOREARM Performed at [facility], [address] [place], [name] [address]    Special Requests   Final    BOTTLES DRAWN AEROBIC AND ANAEROBIC Blood Culture results may not be optimal due to an inadequate volume of blood received in culture bottles Performed at [facility], [address], [place], [name] [address]    Culture   Final    NO GROWTH 5 DAYS Performed at [facility], [address], [place], [name] [address]    Report Status 04/11/2023 FINAL  Final  Respiratory (~20 pathogens) panel by PCR     Status: None   Collection Time: 04/07/23  1:04 AM   Specimen: Nasopharyngeal Swab; Respiratory  Result Value Ref Range Status   Adenovirus NOT DETECTED NOT DETECTED Final   Coronavirus 229E NOT DETECTED NOT DETECTED Final    Comment: (NOTE) The Coronavirus on the Respiratory Panel, DOES NOT test for the novel   Coronavirus (2019 nCoV)    Coronavirus HKU1 NOT DETECTED NOT DETECTED Final   Coronavirus NL63 NOT DETECTED NOT DETECTED Final   Coronavirus OC43 NOT DETECTED NOT DETECTED Final   Metapneumovirus NOT DETECTED NOT DETECTED Final   Rhinovirus / Enterovirus NOT DETECTED NOT DETECTED Final   Influenza A NOT DETECTED NOT DETECTED Final   Influenza B NOT DETECTED NOT DETECTED Final   Parainfluenza Virus 1 NOT DETECTED NOT DETECTED Final   Parainfluenza Virus 2 NOT DETECTED NOT DETECTED Final   Parainfluenza Virus 3 NOT DETECTED NOT DETECTED Final   Parainfluenza Virus 4 NOT DETECTED NOT DETECTED Final   Respiratory Syncytial Virus NOT DETECTED NOT DETECTED Final   Bordetella pertussis NOT DETECTED NOT DETECTED Final   Bordetella Parapertussis NOT DETECTED NOT DETECTED Final   Chlamydophila pneumoniae NOT DETECTED NOT DETECTED Final   Mycoplasma pneumoniae NOT DETECTED NOT DETECTED Final    Comment: Performed at [facility], [address], [place], [name] [address]  Culture, blood (routine x 2) Call MD if unable to obtain prior to antibiotics being given     Status: None   Collection Time: 04/07/23  5:40 AM   Specimen: BLOOD RIGHT ARM  Result Value Ref Range Status   Specimen Description   Final    BLOOD RIGHT ARM Performed at [facility], [address] [place], [name] [address]    Special Requests   Final    BOTTLES DRAWN AEROBIC ONLY Blood Culture adequate volume Performed at [facility], [address] [place], [name] [address]    Culture   Final    NO GROWTH 5 DAYS Performed at [facility], [address], [place], [name] [address]    Report Status 04/12/2023 FINAL  Final  Culture, blood (routine x 2) Call MD if unable to obtain prior to antibiotics being given     Status: None   Collection Time: 04/07/23  5:40 AM   Specimen: BLOOD RIGHT HAND  Result Value Ref Range Status   Specimen Description   Final    BLOOD RIGHT  HAND Performed at  [facility], [address] [place], [name] [address]    Special Requests   Final    BOTTLES DRAWN AEROBIC ONLY Blood Culture adequate volume Performed at [facility], [address], [place], [name] [address]    Culture   Final    NO GROWTH 5 DAYS Performed at [facility], [address], [place], [name] [address]    Report Status 04/12/2023 FINAL  Final  Surgical pcr screen     Status: None   Collection Time: 04/08/23  2:10 AM   Specimen: Nasal Mucosa; Nasal Swab  Result Value Ref Range Status   MRSA, PCR NEGATIVE NEGATIVE Final   Staphylococcus aureus NEGATIVE NEGATIVE Final    Comment: (NOTE) The Xpert SA Assay (FDA approved for NASAL specimens in patients 57 years of age and older), is one component of a comprehensive surveillance program. It is not intended to diagnose infection nor to guide or monitor treatment. Performed at [facility], [address], [place], [name] [address]   SARS Coronavirus 2 by RT PCR (hospital order, performed in [facility] hospital lab) *cepheid single result test* Anterior Nasal Swab     Status: Abnormal   Collection Time: 04/30/23  1:10 PM   Specimen: Anterior Nasal Swab  Result Value Ref Range Status   SARS Coronavirus 2 by RT PCR POSITIVE (A) NEGATIVE Final    Comment: Performed at [facility], [address], [place], [name] [address]    Labs: CBC: Recent Labs  Lab 12/03/23 2004 12/04/23 1135  WBC 9.5 10.1  HGB 12.3* 12.6*  HCT 41.9 41.7  MCV 87.1 85.6  PLT 433* 490*   Basic Metabolic Panel: Recent Labs  Lab 12/03/23 2010 12/04/23 1135 12/05/23 0343 12/06/23 0244 12/07/23 0314 12/08/23 0225  NA 138  --  140 140 139 136  K 5.2*  --  4.4 4.2 4.1 4.0  CL 107  --  107 109 106 105  CO2 24  --  23 25 26 28   GLUCOSE 93  --  79 100* 143* 93  BUN 24*  --  22* 21* 20 18  CREATININE 1.61*  --  1.49* 1.48* 1.60* 1.38*  CALCIUM 8.0*  --  8.3* 7.7* 7.5* 7.5*   MG  --  2.2  --   --  1.8 1.7  PHOS  --  4.1  --   --   --   --    Liver Function Tests: Recent Labs  Lab 12/03/23 2010  AST 15  ALT 11  ALKPHOS 139*  BILITOT 0.6  PROT 6.1*  ALBUMIN  1.8*   CBG: No results for input(s): GLUCAP in the last 168 hours.  Discharge time spent: greater than 30 minutes.  Signed: Elidia Toribio Furnace, MD [facility] 12/08/2023

## 2023-12-09 ENCOUNTER — Other Ambulatory Visit (HOSPITAL_COMMUNITY): Payer: Self-pay

## 2023-12-10 ENCOUNTER — Ambulatory Visit: Admitting: Family Medicine

## 2023-12-16 ENCOUNTER — Ambulatory Visit: Admitting: Family Medicine

## 2023-12-17 ENCOUNTER — Other Ambulatory Visit: Payer: Self-pay | Admitting: Orthopedic Surgery

## 2023-12-17 DIAGNOSIS — Z89512 Acquired absence of left leg below knee: Secondary | ICD-10-CM

## 2023-12-17 DIAGNOSIS — S88112D Complete traumatic amputation at level between knee and ankle, left lower leg, subsequent encounter: Secondary | ICD-10-CM

## 2023-12-25 ENCOUNTER — Ambulatory Visit: Admitting: Internal Medicine

## 2023-12-25 NOTE — Progress Notes (Deleted)
 Name: Alan Tapia. MRN: [id number] DOB: February 25, 1976    CHIEF COMPLAINT:  ASSESSMENT OF SLEEP APNEA EXCESSIVE DAYTIME SLEEPINESS   HISTORY OF PRESENT ILLNESS: Patient is seen today for problems and issues with sleep related to excessive daytime sleepiness Patient  has been having sleep problems for many years Patient has been having excessive daytime sleepiness for a long time Patient has been having extreme fatigue and tiredness, lack of energy +  very Loud snoring every night + struggling breathe at night and gasps for air + morning headaches + Nonrefreshing sleep  Discussed sleep data and reviewed with patient.  Encouraged proper weight management.  Discussed driving precautions and its relationship with hypersomnolence.  Discussed operating dangerous equipment and its relationship with hypersomnolence.  Discussed sleep hygiene, and benefits of a fixed sleep waked time.  The importance of getting eight or more hours of sleep discussed with patient.  Discussed limiting the use of the computer and television before bedtime.  Decrease naps during the day, so night time sleep will become enhanced.  Limit caffeine, and sleep deprivation.  HTN, stroke, and heart failure are potential risk factors.    EPWORTH SLEEP SCORE***    PAST MEDICAL HISTORY :   has a past medical history of Hypertension and Uncontrolled type 2 diabetes mellitus with hyperglycemia, with long-term current use of insulin (HCC) (06/06/2022).  has a past surgical history that includes Amputation (Left, 08/15/2022); Amputation (Right, 04/08/2023); and IR THORACENTESIS ASP PLEURAL SPACE W/IMG GUIDE (12/04/2023). Prior to Admission medications   Medication Sig Start Date End Date Taking? Authorizing Provider  acetaminophen (TYLENOL) 325 MG tablet Take 2 tablets (650 mg total) by mouth every 6 (six) hours as needed for mild pain (pain score 1-3) or headache. 12/08/23   Arrien, Elidia Sieving, MD  bumetanide  (BUMEX) 1 MG tablet Take 1 tablet (1 mg total) by mouth daily. 12/08/23   Arrien, Mauricio Daniel, MD  hydrALAZINE (APRESOLINE) 50 MG tablet Take 1 tablet (50 mg total) by mouth every 8 (eight) hours. 12/08/23 01/07/24  Arrien, Mauricio Daniel, MD  isosorbide mononitrate (IMDUR) 30 MG 24 hr tablet Take 1 tablet (30 mg total) by mouth daily. 12/09/23   Arrien, Mauricio Daniel, MD  losartan (COZAAR) 25 MG tablet Take 1 tablet (25 mg total) by mouth daily. 12/09/23   Arrien, Mauricio Daniel, MD  metoprolol succinate (TOPROL-XL) 25 MG 24 hr tablet Take 0.5 tablets (12.5 mg total) by mouth daily. 12/08/23   Arrien, Mauricio Daniel, MD  pantoprazole (PROTONIX) 40 MG tablet Take 1 tablet (40 mg total) by mouth daily. 12/08/23   Arrien, Mauricio Daniel, MD  spironolactone (ALDACTONE) 25 MG tablet Take 0.5 tablets (12.5 mg total) by mouth daily. 12/09/23   Arrien, Elidia Sieving, MD   Allergies  Allergen Reactions   Fish Allergy Anaphylaxis, Hives and Other (See Comments)   Lasix [Furosemide] Hives   Cefepime Other (See Comments)    Redness of the neck and cheek.  Concern for rash   Coreg [Carvedilol] Nausea Only   Fish-Derived Products    Vancomycin Hives    Redman's-type rash on 08/13/22    FAMILY HISTORY:  family history includes Allergies in his father; Arthritis in his mother; Diabetes in his father; Hypertension in his father. SOCIAL HISTORY:  reports that he has never smoked. He has never used smokeless tobacco. He reports that he does not drink alcohol and does not use drugs.     VITAL SIGNS: @VSRANGES @     Review of Systems:  Gen:  Denies  fever, sweats, chills weight loss  HEENT: Denies blurred vision, double vision, ear pain, eye pain, hearing loss, nose bleeds, sore throat Cardiac:  No dizziness, chest pain or heaviness, chest tightness,edema, No JVD Resp:   No cough, -sputum production, -shortness of breath,-wheezing, -hemoptysis,  Other:  All other systems negative   Physical  Examination:   General Appearance: No distress  EYES PERRLA, EOM intact.   NECK Supple, No JVD Pulmonary: normal breath sounds, No wheezing.  CardiovascularNormal S1,S2.  No m/r/g.   Abdomen: Benign, Soft, non-tender. Neurology UE/LE 5/5 strength, no focal deficits Ext pulses intact, cap refill intact ALL OTHER ROS ARE NEGATIVE     ASSESSMENT AND PLAN SYNOPSIS  Patient with signs and symptoms of excessive daytime sleepiness with probable underlying diagnosis of obstructive sleep apnea in the setting of obesity and deconditioned state   Recommend Sleep Study for definitve diagnosis  Obesity -recommend significant weight loss -recommend changing diet  Deconditioned state -Recommend increased daily activity and exercise   MEDICATION ADJUSTMENTS/LABS AND TESTS ORDERED: Recommend Sleep Study Recommend weight loss   CURRENT MEDICATIONS REVIEWED AT LENGTH WITH PATIENT TODAY   Patient  satisfied with Plan of action and management. All questions answered   Follow up    I spent a total of *** minutes dedicated to the care of this patient on the date of this encounter to include pre-visit review of records, face-to-face time with the patient discussing conditions above, post visit ordering of testing, clinical documentation with the electronic health record, making appropriate referrals as documented, and communicating necessary information to the patient's healthcare team.     Nickolas Alm Cellar, M.D.  [facility]  Medical Director [facility] Medical Director [facility] Cardio-Pulmonary Department

## 2024-01-11 ENCOUNTER — Inpatient Hospital Stay (HOSPITAL_COMMUNITY)
Admission: EM | Admit: 2024-01-11 | Discharge: 2024-02-10 | DRG: 308 | Disposition: E | Attending: Internal Medicine | Admitting: Internal Medicine

## 2024-01-11 ENCOUNTER — Inpatient Hospital Stay (HOSPITAL_COMMUNITY)

## 2024-01-11 ENCOUNTER — Encounter (HOSPITAL_COMMUNITY): Payer: Self-pay

## 2024-01-11 ENCOUNTER — Emergency Department (HOSPITAL_COMMUNITY)

## 2024-01-11 ENCOUNTER — Other Ambulatory Visit: Payer: Self-pay

## 2024-01-11 DIAGNOSIS — J9602 Acute respiratory failure with hypercapnia: Secondary | ICD-10-CM | POA: Diagnosis present

## 2024-01-11 DIAGNOSIS — E11649 Type 2 diabetes mellitus with hypoglycemia without coma: Secondary | ICD-10-CM | POA: Diagnosis not present

## 2024-01-11 DIAGNOSIS — J90 Pleural effusion, not elsewhere classified: Secondary | ICD-10-CM | POA: Diagnosis not present

## 2024-01-11 DIAGNOSIS — E66813 Obesity, class 3: Secondary | ICD-10-CM | POA: Diagnosis present

## 2024-01-11 DIAGNOSIS — Z515 Encounter for palliative care: Secondary | ICD-10-CM | POA: Diagnosis not present

## 2024-01-11 DIAGNOSIS — E119 Type 2 diabetes mellitus without complications: Secondary | ICD-10-CM | POA: Diagnosis not present

## 2024-01-11 DIAGNOSIS — E875 Hyperkalemia: Secondary | ICD-10-CM | POA: Diagnosis not present

## 2024-01-11 DIAGNOSIS — I4901 Ventricular fibrillation: Secondary | ICD-10-CM | POA: Diagnosis present

## 2024-01-11 DIAGNOSIS — G253 Myoclonus: Secondary | ICD-10-CM | POA: Diagnosis not present

## 2024-01-11 DIAGNOSIS — I739 Peripheral vascular disease, unspecified: Secondary | ICD-10-CM

## 2024-01-11 DIAGNOSIS — Z794 Long term (current) use of insulin: Secondary | ICD-10-CM

## 2024-01-11 DIAGNOSIS — D649 Anemia, unspecified: Secondary | ICD-10-CM | POA: Diagnosis present

## 2024-01-11 DIAGNOSIS — G931 Anoxic brain damage, not elsewhere classified: Secondary | ICD-10-CM | POA: Diagnosis present

## 2024-01-11 DIAGNOSIS — R569 Unspecified convulsions: Secondary | ICD-10-CM | POA: Diagnosis not present

## 2024-01-11 DIAGNOSIS — R579 Shock, unspecified: Secondary | ICD-10-CM | POA: Diagnosis not present

## 2024-01-11 DIAGNOSIS — Z6841 Body Mass Index (BMI) 40.0 and over, adult: Secondary | ICD-10-CM | POA: Diagnosis not present

## 2024-01-11 DIAGNOSIS — J810 Acute pulmonary edema: Secondary | ICD-10-CM | POA: Diagnosis not present

## 2024-01-11 DIAGNOSIS — Z89512 Acquired absence of left leg below knee: Secondary | ICD-10-CM

## 2024-01-11 DIAGNOSIS — R570 Cardiogenic shock: Secondary | ICD-10-CM | POA: Diagnosis present

## 2024-01-11 DIAGNOSIS — I130 Hypertensive heart and chronic kidney disease with heart failure and stage 1 through stage 4 chronic kidney disease, or unspecified chronic kidney disease: Secondary | ICD-10-CM | POA: Diagnosis present

## 2024-01-11 DIAGNOSIS — I462 Cardiac arrest due to underlying cardiac condition: Secondary | ICD-10-CM | POA: Diagnosis present

## 2024-01-11 DIAGNOSIS — J9601 Acute respiratory failure with hypoxia: Secondary | ICD-10-CM | POA: Diagnosis present

## 2024-01-11 DIAGNOSIS — Z888 Allergy status to other drugs, medicaments and biological substances status: Secondary | ICD-10-CM

## 2024-01-11 DIAGNOSIS — N179 Acute kidney failure, unspecified: Secondary | ICD-10-CM | POA: Diagnosis present

## 2024-01-11 DIAGNOSIS — Z881 Allergy status to other antibiotic agents status: Secondary | ICD-10-CM

## 2024-01-11 DIAGNOSIS — E1122 Type 2 diabetes mellitus with diabetic chronic kidney disease: Secondary | ICD-10-CM | POA: Diagnosis present

## 2024-01-11 DIAGNOSIS — Z66 Do not resuscitate: Secondary | ICD-10-CM | POA: Diagnosis not present

## 2024-01-11 DIAGNOSIS — E872 Acidosis, unspecified: Secondary | ICD-10-CM | POA: Diagnosis present

## 2024-01-11 DIAGNOSIS — Z8249 Family history of ischemic heart disease and other diseases of the circulatory system: Secondary | ICD-10-CM

## 2024-01-11 DIAGNOSIS — Z91013 Allergy to seafood: Secondary | ICD-10-CM

## 2024-01-11 DIAGNOSIS — G9341 Metabolic encephalopathy: Secondary | ICD-10-CM

## 2024-01-11 DIAGNOSIS — I5043 Acute on chronic combined systolic (congestive) and diastolic (congestive) heart failure: Secondary | ICD-10-CM | POA: Diagnosis present

## 2024-01-11 DIAGNOSIS — E1151 Type 2 diabetes mellitus with diabetic peripheral angiopathy without gangrene: Secondary | ICD-10-CM | POA: Diagnosis present

## 2024-01-11 DIAGNOSIS — N1832 Chronic kidney disease, stage 3b: Secondary | ICD-10-CM

## 2024-01-11 DIAGNOSIS — N1831 Chronic kidney disease, stage 3a: Secondary | ICD-10-CM | POA: Diagnosis present

## 2024-01-11 DIAGNOSIS — E876 Hypokalemia: Secondary | ICD-10-CM | POA: Diagnosis not present

## 2024-01-11 DIAGNOSIS — I255 Ischemic cardiomyopathy: Secondary | ICD-10-CM | POA: Diagnosis present

## 2024-01-11 DIAGNOSIS — Z89511 Acquired absence of right leg below knee: Secondary | ICD-10-CM | POA: Diagnosis not present

## 2024-01-11 DIAGNOSIS — I469 Cardiac arrest, cause unspecified: Principal | ICD-10-CM

## 2024-01-11 DIAGNOSIS — E162 Hypoglycemia, unspecified: Secondary | ICD-10-CM | POA: Diagnosis not present

## 2024-01-11 DIAGNOSIS — Z91148 Patient's other noncompliance with medication regimen for other reason: Secondary | ICD-10-CM

## 2024-01-11 DIAGNOSIS — I5082 Biventricular heart failure: Secondary | ICD-10-CM | POA: Diagnosis present

## 2024-01-11 DIAGNOSIS — Z833 Family history of diabetes mellitus: Secondary | ICD-10-CM

## 2024-01-11 DIAGNOSIS — Z8261 Family history of arthritis: Secondary | ICD-10-CM

## 2024-01-11 LAB — COMPREHENSIVE METABOLIC PANEL WITH GFR
ALT: 16 U/L (ref 0–44)
AST: 37 U/L (ref 15–41)
Albumin: 1.7 g/dL — ABNORMAL LOW (ref 3.5–5.0)
Alkaline Phosphatase: 141 U/L — ABNORMAL HIGH (ref 38–126)
Anion gap: 16 — ABNORMAL HIGH (ref 5–15)
BUN: 25 mg/dL — ABNORMAL HIGH (ref 6–20)
CO2: 16 mmol/L — ABNORMAL LOW (ref 22–32)
Calcium: 7.8 mg/dL — ABNORMAL LOW (ref 8.9–10.3)
Chloride: 110 mmol/L (ref 98–111)
Creatinine, Ser: 1.98 mg/dL — ABNORMAL HIGH (ref 0.61–1.24)
GFR, Estimated: 41 mL/min — ABNORMAL LOW (ref >60)
Glucose, Bld: 211 mg/dL — ABNORMAL HIGH (ref 70–99)
Potassium: 5.2 mmol/L — ABNORMAL HIGH (ref 3.5–5.1)
Sodium: 142 mmol/L (ref 135–145)
Total Bilirubin: 0.5 mg/dL (ref 0.0–1.2)
Total Protein: 5.9 g/dL — ABNORMAL LOW (ref 6.5–8.1)

## 2024-01-11 LAB — ECHOCARDIOGRAM COMPLETE
AR max vel: 3.21 cm2
AV Area VTI: 3.23 cm2
AV Area mean vel: 3.31 cm2
AV Mean grad: 2 mmHg
AV Peak grad: 4 mmHg
Ao pk vel: 1 m/s
Area-P 1/2: 2.22 cm2
Est EF: 40
S' Lateral: 4.5 cm
Weight: 5291.04 [oz_av]

## 2024-01-11 LAB — I-STAT ARTERIAL BLOOD GAS, ED
Acid-base deficit: 5 mmol/L — ABNORMAL HIGH (ref 0.0–2.0)
Bicarbonate: 22.8 mmol/L (ref 20.0–28.0)
Calcium, Ion: 1.16 mmol/L (ref 1.15–1.40)
HCT: 31 % — ABNORMAL LOW (ref 39.0–52.0)
Hemoglobin: 10.5 g/dL — ABNORMAL LOW (ref 13.0–17.0)
O2 Saturation: 100 %
Patient temperature: 96.4
Potassium: 4.8 mmol/L (ref 3.5–5.1)
Sodium: 139 mmol/L (ref 135–145)
TCO2: 24 mmol/L (ref 22–32)
pCO2 arterial: 49.2 mmHg — ABNORMAL HIGH (ref 32–48)
pH, Arterial: 7.268 — ABNORMAL LOW (ref 7.35–7.45)
pO2, Arterial: 221 mmHg — ABNORMAL HIGH (ref 83–108)

## 2024-01-11 LAB — GLUCOSE, CAPILLARY
Glucose-Capillary: 104 mg/dL — ABNORMAL HIGH (ref 70–99)
Glucose-Capillary: 117 mg/dL — ABNORMAL HIGH (ref 70–99)
Glucose-Capillary: 151 mg/dL — ABNORMAL HIGH (ref 70–99)
Glucose-Capillary: 78 mg/dL (ref 70–99)

## 2024-01-11 LAB — CBC
HCT: 41.5 % (ref 39.0–52.0)
Hemoglobin: 11.6 g/dL — ABNORMAL LOW (ref 13.0–17.0)
MCH: 25.7 pg — ABNORMAL LOW (ref 26.0–34.0)
MCHC: 28 g/dL — ABNORMAL LOW (ref 30.0–36.0)
MCV: 91.8 fL (ref 80.0–100.0)
Platelets: 488 K/uL — ABNORMAL HIGH (ref 150–400)
RBC: 4.52 MIL/uL (ref 4.22–5.81)
RDW: 16 % — ABNORMAL HIGH (ref 11.5–15.5)
WBC: 17.4 K/uL — ABNORMAL HIGH (ref 4.0–10.5)
nRBC: 0.1 % (ref 0.0–0.2)

## 2024-01-11 LAB — URINALYSIS, W/ REFLEX TO CULTURE (INFECTION SUSPECTED)
Bilirubin Urine: NEGATIVE
Glucose, UA: >=500 mg/dL — AB
Ketones, ur: NEGATIVE mg/dL
Leukocytes,Ua: NEGATIVE
Nitrite: NEGATIVE
Protein, ur: >=300 mg/dL — AB
Specific Gravity, Urine: 1.015 (ref 1.005–1.030)
WBC, UA: >50 WBC/hpf (ref 0–5)
pH: 6 (ref 5.0–8.0)

## 2024-01-11 LAB — POCT I-STAT 7, (LYTES, BLD GAS, ICA,H+H)
Acid-base deficit: 2 mmol/L (ref 0.0–2.0)
Bicarbonate: 22 mmol/L (ref 20.0–28.0)
Calcium, Ion: 1.18 mmol/L (ref 1.15–1.40)
HCT: 30 % — ABNORMAL LOW (ref 39.0–52.0)
Hemoglobin: 10.2 g/dL — ABNORMAL LOW (ref 13.0–17.0)
O2 Saturation: 100 %
Patient temperature: 96.6
Potassium: 4.7 mmol/L (ref 3.5–5.1)
Sodium: 139 mmol/L (ref 135–145)
TCO2: 23 mmol/L (ref 22–32)
pCO2 arterial: 33.9 mmHg (ref 32–48)
pH, Arterial: 7.416 (ref 7.35–7.45)
pO2, Arterial: 207 mmHg — ABNORMAL HIGH (ref 83–108)

## 2024-01-11 LAB — BRAIN NATRIURETIC PEPTIDE: B Natriuretic Peptide: 605.9 pg/mL — ABNORMAL HIGH (ref 0.0–100.0)

## 2024-01-11 LAB — COOXEMETRY PANEL
Carboxyhemoglobin: 1.7 % — ABNORMAL HIGH (ref 0.5–1.5)
Methemoglobin: 0.7 % (ref 0.0–1.5)
O2 Saturation: 89.5 %
Total hemoglobin: 11 g/dL — ABNORMAL LOW (ref 12.0–16.0)

## 2024-01-11 LAB — TROPONIN I (HIGH SENSITIVITY)
Troponin I (High Sensitivity): 30 ng/L — ABNORMAL HIGH (ref <18)
Troponin I (High Sensitivity): 74 ng/L — ABNORMAL HIGH (ref <18)

## 2024-01-11 LAB — RAPID URINE DRUG SCREEN, HOSP PERFORMED
Amphetamines: NOT DETECTED
Amphetamines: NOT DETECTED
Barbiturates: NOT DETECTED
Barbiturates: NOT DETECTED
Benzodiazepines: NOT DETECTED
Benzodiazepines: NOT DETECTED
Cocaine: NOT DETECTED
Cocaine: NOT DETECTED
Opiates: NOT DETECTED
Opiates: NOT DETECTED
Tetrahydrocannabinol: NOT DETECTED
Tetrahydrocannabinol: NOT DETECTED

## 2024-01-11 LAB — CBG MONITORING, ED: Glucose-Capillary: 184 mg/dL — ABNORMAL HIGH (ref 70–99)

## 2024-01-11 LAB — MRSA NEXT GEN BY PCR, NASAL: MRSA by PCR Next Gen: DETECTED — AB

## 2024-01-11 LAB — TRIGLYCERIDES: Triglycerides: 129 mg/dL (ref <150)

## 2024-01-11 LAB — PROCALCITONIN: Procalcitonin: 0.5 ng/mL

## 2024-01-11 LAB — PROTIME-INR
INR: 1.2 (ref 0.8–1.2)
Prothrombin Time: 15.7 s — ABNORMAL HIGH (ref 11.4–15.2)

## 2024-01-11 LAB — I-STAT CG4 LACTIC ACID, ED: Lactic Acid, Venous: 4.6 mmol/L (ref 0.5–1.9)

## 2024-01-11 LAB — MAGNESIUM: Magnesium: 2.1 mg/dL (ref 1.7–2.4)

## 2024-01-11 LAB — CG4 I-STAT (LACTIC ACID): Lactic Acid, Venous: 1.3 mmol/L (ref 0.5–1.9)

## 2024-01-11 LAB — TYPE AND SCREEN
ABO/RH(D): O POS
Antibody Screen: NEGATIVE

## 2024-01-11 LAB — CK TOTAL AND CKMB (NOT AT ARMC)
CK, MB: 12.2 ng/mL — ABNORMAL HIGH (ref 0.5–5.0)
Total CK: 409 U/L — ABNORMAL HIGH (ref 49–397)

## 2024-01-11 LAB — APTT: aPTT: 30 s (ref 24–36)

## 2024-01-11 LAB — PHOSPHORUS: Phosphorus: 6.1 mg/dL — ABNORMAL HIGH (ref 2.5–4.6)

## 2024-01-11 MED ORDER — EPINEPHRINE HCL 5 MG/250ML IV SOLN IN NS: 0.5000 ug/min | INTRAVENOUS | Status: DC

## 2024-01-11 MED ORDER — CHLORHEXIDINE GLUCONATE CLOTH 2 % EX PADS
6.0000 | MEDICATED_PAD | Freq: Every day | CUTANEOUS | Status: DC
  Administered 2024-01-11 – 2024-01-14 (×4): 6 via TOPICAL

## 2024-01-11 MED ORDER — DOCUSATE SODIUM 100 MG PO CAPS: 100.0000 mg | ORAL_CAPSULE | Freq: Two times a day (BID) | ORAL | Status: DC | PRN

## 2024-01-11 MED ORDER — FENTANYL CITRATE PF 50 MCG/ML IJ SOSY
50.0000 ug | PREFILLED_SYRINGE | INTRAMUSCULAR | Status: AC | PRN
  Administered 2024-01-12 – 2024-01-14 (×3): 50 ug via INTRAVENOUS
  Filled 2024-01-11 (×3): qty 1

## 2024-01-11 MED ORDER — PROPOFOL 1000 MG/100ML IV EMUL: 0.0000 ug/kg/min | INTRAVENOUS | Status: DC

## 2024-01-11 MED ORDER — FENTANYL 2500MCG IN NS 250ML (10MCG/ML) PREMIX INFUSION: 0.0000 ug/h | INTRAVENOUS | Status: DC

## 2024-01-11 MED ORDER — FAMOTIDINE 20 MG PO TABS
20.0000 mg | ORAL_TABLET | Freq: Two times a day (BID) | ORAL | Status: DC
  Administered 2024-01-11 – 2024-01-13 (×4): 20 mg
  Filled 2024-01-11 (×4): qty 1

## 2024-01-11 MED ORDER — VALPROATE SODIUM 100 MG/ML IV SOLN
500.0000 mg | Freq: Four times a day (QID) | INTRAVENOUS | Status: DC
  Administered 2024-01-11 – 2024-01-14 (×13): 500 mg via INTRAVENOUS
  Filled 2024-01-11 (×7): qty 5
  Filled 2024-01-11: qty 500
  Filled 2024-01-11: qty 5
  Filled 2024-01-11: qty 500
  Filled 2024-01-11 (×2): qty 5
  Filled 2024-01-11: qty 500
  Filled 2024-01-11 (×4): qty 5

## 2024-01-11 MED ORDER — POLYETHYLENE GLYCOL 3350 17 G PO PACK
17.0000 g | PACK | Freq: Every day | ORAL | Status: DC
  Filled 2024-01-11: qty 1

## 2024-01-11 MED ORDER — FENTANYL CITRATE PF 50 MCG/ML IJ SOSY
50.0000 ug | PREFILLED_SYRINGE | INTRAMUSCULAR | Status: DC | PRN
  Administered 2024-01-14: 200 ug via INTRAVENOUS
  Administered 2024-01-14: 100 ug via INTRAVENOUS
  Administered 2024-01-14: 150 ug via INTRAVENOUS
  Filled 2024-01-11: qty 4
  Filled 2024-01-11: qty 3
  Filled 2024-01-11: qty 2

## 2024-01-11 MED ORDER — FENTANYL 2500MCG IN NS 250ML (10MCG/ML) PREMIX INFUSION
0.0000 ug/h | INTRAVENOUS | Status: DC
  Administered 2024-01-11: 25 ug/h via INTRAVENOUS
  Filled 2024-01-11: qty 250

## 2024-01-11 MED ORDER — ASPIRIN 81 MG PO CHEW
81.0000 mg | CHEWABLE_TABLET | Freq: Every day | ORAL | Status: DC
  Administered 2024-01-11 – 2024-01-14 (×4): 81 mg
  Filled 2024-01-11 (×4): qty 1

## 2024-01-11 MED ORDER — INSULIN ASPART 100 UNIT/ML IJ SOLN
0.0000 [IU] | INTRAMUSCULAR | Status: DC
  Administered 2024-01-11: 3 [IU] via SUBCUTANEOUS

## 2024-01-11 MED ORDER — DOCUSATE SODIUM 50 MG/5ML PO LIQD: 100.0000 mg | Freq: Two times a day (BID) | ORAL | Status: DC | PRN

## 2024-01-11 MED ORDER — FAMOTIDINE 20 MG PO TABS
20.0000 mg | ORAL_TABLET | Freq: Two times a day (BID) | ORAL | Status: DC
Start: 2024-01-11 — End: 2024-01-11

## 2024-01-11 MED ORDER — FENTANYL BOLUS VIA INFUSION: 25.0000 ug | INTRAVENOUS | Status: DC | PRN

## 2024-01-11 MED ORDER — HEPARIN SODIUM (PORCINE) 5000 UNIT/ML IJ SOLN
5000.0000 [IU] | Freq: Three times a day (TID) | INTRAMUSCULAR | Status: DC
  Administered 2024-01-11 – 2024-01-14 (×9): 5000 [IU] via SUBCUTANEOUS
  Filled 2024-01-11 (×9): qty 1

## 2024-01-11 MED ORDER — BUMETANIDE 0.25 MG/ML IJ SOLN
2.0000 mg | Freq: Two times a day (BID) | INTRAMUSCULAR | Status: DC
  Administered 2024-01-11 (×2): 2 mg via INTRAVENOUS
  Filled 2024-01-11 (×3): qty 8

## 2024-01-11 MED ORDER — ACETAMINOPHEN 325 MG PO TABS
650.0000 mg | ORAL_TABLET | ORAL | Status: DC | PRN
  Administered 2024-01-12: 650 mg
  Filled 2024-01-11: qty 2

## 2024-01-11 MED ORDER — FENTANYL CITRATE PF 50 MCG/ML IJ SOSY: 100.0000 ug | PREFILLED_SYRINGE | INTRAMUSCULAR | Status: DC | PRN

## 2024-01-11 MED ORDER — PROPOFOL 1000 MG/100ML IV EMUL
0.0000 ug/kg/min | INTRAVENOUS | Status: DC
  Administered 2024-01-11: 20 ug/kg/min via INTRAVENOUS
  Administered 2024-01-11 (×3): 30 ug/kg/min via INTRAVENOUS
  Administered 2024-01-12 (×2): 20 ug/kg/min via INTRAVENOUS
  Administered 2024-01-12: 15 ug/kg/min via INTRAVENOUS
  Administered 2024-01-12 – 2024-01-13 (×2): 30 ug/kg/min via INTRAVENOUS
  Administered 2024-01-13: 25 ug/kg/min via INTRAVENOUS
  Administered 2024-01-13 (×3): 40 ug/kg/min via INTRAVENOUS
  Administered 2024-01-13: 30 ug/kg/min via INTRAVENOUS
  Administered 2024-01-14: 40 ug/kg/min via INTRAVENOUS
  Administered 2024-01-14: 30 ug/kg/min via INTRAVENOUS
  Filled 2024-01-11 (×2): qty 100
  Filled 2024-01-11: qty 200
  Filled 2024-01-11 (×3): qty 100
  Filled 2024-01-11: qty 300
  Filled 2024-01-11 (×9): qty 100

## 2024-01-11 MED ORDER — POLYETHYLENE GLYCOL 3350 17 G PO PACK: 17.0000 g | PACK | Freq: Every day | ORAL | Status: DC | PRN

## 2024-01-11 MED ORDER — DOCUSATE SODIUM 50 MG/5ML PO LIQD
100.0000 mg | Freq: Two times a day (BID) | ORAL | Status: DC
  Administered 2024-01-11: 100 mg
  Filled 2024-01-11 (×2): qty 10

## 2024-01-11 MED ORDER — ASPIRIN 300 MG RE SUPP: 300.0000 mg | Freq: Every day | RECTAL | Status: DC

## 2024-01-11 MED ORDER — SODIUM CHLORIDE 0.9 % IV SOLN
3.0000 g | Freq: Four times a day (QID) | INTRAVENOUS | Status: DC
  Administered 2024-01-11 – 2024-01-12 (×4): 3 g via INTRAVENOUS
  Filled 2024-01-11 (×4): qty 8

## 2024-01-11 MED ORDER — FENTANYL CITRATE PF 50 MCG/ML IJ SOSY
25.0000 ug | PREFILLED_SYRINGE | Freq: Once | INTRAMUSCULAR | Status: AC
  Administered 2024-01-11: 25 ug via INTRAVENOUS

## 2024-01-11 NOTE — ED Triage Notes (Signed)
 Pt BIB [facility] from home d/t witnessed arrest by wife who lowered him to the floor. EMS arrived & he was in V-Fib initially. CPR started at 0750 & ROSC obtained at 0809. Did receive 3 shocks & 3 Epi's. Post ROSC while en route: 100 bpm, 80 SBP palp- Epi gtt started then 120 SBP palp, 96% with assisted resp via BVM/7 ET, Cap 99. Was reported to have some spontaneous resp en route, family reports to EMS he recently had fluid overload & 6 lb fluid removed from lungs. 25 IO in Rt humeral head by EMS. Pulses still present upon arrival.

## 2024-01-11 NOTE — Progress Notes (Signed)
 Pt ETT advanced 2 cm. Pt ETT now 27cm at the lip.

## 2024-01-11 NOTE — ED Provider Notes (Signed)
 [facility] AT [facility] Provider Note   CSN: [id number] Arrival date & time: 01/11/24  9161     Patient presents with: Post CPR   Alan Tapia. is a 48 y.o. male.   HPI Patient presents for cardiac arrest.  Medical history includes DM, HTN, CHF, CKD, osteomyelitis s/p bilateral BKA's.  He was admitted a month ago for CHF exacerbation.  He underwent IV diuresis.  15 L were removed.  Per EMS, his family reports that he has been having ongoing shortness of breath at home since hospital discharge.  This morning, he had a slide out of the bed.  EMS was initially called for lift assist.  While the patient was on the floor, he went unresponsive.  When EMS arrived, he was in prone position and pulseless.  He was placed on monitor which showed ventricular fibrillation.  He underwent 3 to fibrillatory shocks and 3 doses of epinephrine  with approximately 19 minutes of CPR in the field.  ROSC was obtained.  He was started on epinephrine  gtt.  He was intubated.  He has been having some spontaneous movements since achieving ROSC.    Prior to Admission medications   Medication Sig Start Date End Date Taking? Authorizing Provider  acetaminophen  (TYLENOL ) 325 MG tablet Take 2 tablets (650 mg total) by mouth every 6 (six) hours as needed for mild pain (pain score 1-3) or headache. 12/08/23   Arrien, Elidia Sieving, MD  bumetanide  (BUMEX ) 1 MG tablet Take 1 tablet (1 mg total) by mouth daily. 12/08/23   Arrien, Mauricio Daniel, MD  hydrALAZINE  (APRESOLINE ) 50 MG tablet Take 1 tablet (50 mg total) by mouth every 8 (eight) hours. 12/08/23 01/07/24  Arrien, Elidia Sieving, MD  isosorbide  mononitrate (IMDUR ) 30 MG 24 hr tablet Take 1 tablet (30 mg total) by mouth daily. 12/09/23   Arrien, Mauricio Daniel, MD  losartan  (COZAAR ) 25 MG tablet Take 1 tablet (25 mg total) by mouth daily. 12/09/23   Arrien, Mauricio Daniel, MD  metoprolol  succinate (TOPROL -XL) 25 MG 24 hr tablet Take 0.5  tablets (12.5 mg total) by mouth daily. 12/08/23   Arrien, Mauricio Daniel, MD  pantoprazole  (PROTONIX ) 40 MG tablet Take 1 tablet (40 mg total) by mouth daily. 12/08/23   Arrien, Mauricio Daniel, MD  spironolactone  (ALDACTONE ) 25 MG tablet Take 0.5 tablets (12.5 mg total) by mouth daily. 12/09/23   Arrien, Mauricio Daniel, MD    Allergies: Fish allergy, Lasix  [furosemide ], Cefepime , Coreg  [carvedilol ], Fish-derived products, and Vancomycin     Review of Systems  Unable to perform ROS: Patient unresponsive    Updated Vital Signs BP 133/75   Pulse (!) 26   Temp (!) 96.2 F (35.7 C)   Resp (!) 21   Wt (!) 150 kg   SpO2 100%   BMI 40.25 kg/m   Physical Exam Vitals and nursing note reviewed.  Constitutional:      Appearance: He is well-developed. He is obese. He is ill-appearing.  HENT:     Head: Normocephalic and atraumatic.     Right Ear: External ear normal.     Left Ear: External ear normal.     Nose: Nose normal.     Mouth/Throat:     Mouth: Mucous membranes are moist.     Comments: 7.0 cm ETT in place Eyes:     Conjunctiva/sclera: Conjunctivae normal.  Cardiovascular:     Rate and Rhythm: Regular rhythm. Tachycardia present.     Heart sounds: No murmur heard. Pulmonary:  Comments: Ventilated with BVM.  Bilateral breath sounds present. Abdominal:     Palpations: Abdomen is soft.     Comments: Diffuse truncal edema present  Musculoskeletal:        General: Swelling present. No deformity.     Cervical back: Neck supple.     Right lower leg: Edema present.     Left lower leg: Edema present.     Comments: Bilateral BKA's present  Skin:    General: Skin is warm and dry.  Neurological:     Mental Status: He is alert.     GCS: GCS eye subscore is 1. GCS verbal subscore is 1. GCS motor subscore is 1.  Psychiatric:        Mood and Affect: Mood normal.     (all labs ordered are listed, but only abnormal results are displayed) Labs Reviewed  CBC - Abnormal; Notable  for the following components:      Result Value   WBC 17.4 (*)    Hemoglobin 11.6 (*)    MCH 25.7 (*)    MCHC 28.0 (*)    RDW 16.0 (*)    Platelets 488 (*)    All other components within normal limits  PROTIME-INR - Abnormal; Notable for the following components:   Prothrombin Time 15.7 (*)    All other components within normal limits  I-STAT CG4 LACTIC ACID, ED - Abnormal; Notable for the following components:   Lactic Acid, Venous 4.6 (*)    All other components within normal limits  CBG MONITORING, ED - Abnormal; Notable for the following components:   Glucose-Capillary 184 (*)    All other components within normal limits  CULTURE, BLOOD (ROUTINE X 2)  CULTURE, BLOOD (ROUTINE X 2)  CULTURE, RESPIRATORY W GRAM STAIN  APTT  BLOOD GAS, ARTERIAL  COMPREHENSIVE METABOLIC PANEL WITH GFR  MAGNESIUM   PHOSPHORUS  RAPID URINE DRUG SCREEN, HOSP PERFORMED  URINALYSIS, W/ REFLEX TO CULTURE (INFECTION SUSPECTED)  BRAIN NATRIURETIC PEPTIDE  TRIGLYCERIDES  TYPE AND SCREEN  TROPONIN I (HIGH SENSITIVITY)    EKG: None  Radiology: [facility] Chest Port 1 View Addendum Date: 01/11/2024 ADDENDUM REPORT: 01/11/2024 09:45 ADDENDUM: Study discussed by telephone with Dr. Mackie Goon on 01/11/2024 at 09:44 . Electronically Signed   By: VEAR Hurst M.D.   On: 01/11/2024 09:45   Result Date: 01/11/2024 CLINICAL DATA:  48 year old male status post CPR. EXAM: PORTABLE CHEST 1 VIEW COMPARISON:  Portable chest 12/04/2023. FINDINGS: Portable AP supine view at 0859 hours. Substantially rotated to the left, more so than on the comparison. Endotracheal tube is visible in the trachea, tip at the level the clavicles. An enteric tube is looped over the neck in the midline. Poor ventilation of the left lung. July appearance suggested loculated pleural fluid, might have progressed. Veiling opacity over the right lung also, with moderate size right pleural effusion suspected previously. Poorly visible cardiac and mediastinal  contours. No displaced rib fracture identified. IMPRESSION: 1. Leftward rotated portable chest. 2. Endotracheal tube tip at the level the clavicles. Enteric tube looped at the hypopharynx or proximal esophagus. 3. Poor ventilation of the left lung, possibly progression of left pleural effusion seen last month. Ongoing right lung veiling opacity, also likely related to effusion. Electronically Signed: By: VEAR Hurst M.D. On: 01/11/2024 09:25   DG Abd Portable 1V Addendum Date: 01/11/2024 ADDENDUM REPORT: 01/11/2024 09:44 ADDENDUM: Study discussed by telephone with Dr. Ryiah Bellissimo on 01/11/2024 at 09:44 . Electronically Signed   By: VEAR  Shona M.D.   On: 01/11/2024 09:44   Result Date: 01/11/2024 CLINICAL DATA:  48 year old male status post CPR. EXAM: PORTABLE ABDOMEN - 1 VIEW COMPARISON:  Portable chest today 0859 hours. FINDINGS: 4 portable views of the abdomen 0904 hours. Enteric tube seen looped over the lower neck on the comparison. Nonobstructed visible bowel gas pattern. Included lung bases suggest large left and moderate right pleural effusions as suspected on the comparison. No acute osseous abnormality identified. IMPRESSION: 1. Enteric tube seen looped over the lower neck on contemporary portable chest. Recommend removal and repositioning. 2. Nonobstructed visible bowel gas pattern. 3. Further evidence of large left and moderate right pleural effusions. Electronically Signed: By: VEAR Shona M.D. On: 01/11/2024 09:26     Central Line  Date/Time: 01/11/2024 9:41 AM  Performed by: Melvenia Motto, MD Authorized by: Melvenia Motto, MD   Consent:    Consent obtained:  Verbal   Consent given by:  Spouse   Risks, benefits, and alternatives were discussed: yes   Universal protocol:    Procedure explained and questions answered to patient or proxy's satisfaction: yes   Pre-procedure details:    Indication(s): central venous access     Hand hygiene: Hand hygiene performed prior to insertion     Sterile barrier  technique: All elements of maximal sterile technique followed     Skin preparation:  Chlorhexidine    Skin preparation agent: Skin preparation agent completely dried prior to procedure   Sedation:    Sedation type:  Deep Anesthesia:    Anesthesia method:  None Procedure details:    Location:  R internal jugular   Patient position:  Supine   Procedural supplies:  Triple lumen   Catheter size:  7 Fr   Landmarks identified: yes     Ultrasound guidance: yes     Ultrasound guidance timing: prior to insertion and real time     Sterile ultrasound techniques: Sterile gel and sterile probe covers were used     Number of attempts:  1   Successful placement: yes   Post-procedure details:    Post-procedure:  Dressing applied and line sutured   Assessment:  Blood return through all ports and free fluid flow   Procedure completion:  Tolerated well, no immediate complications    Medications Ordered in the ED  Chlorhexidine  Gluconate Cloth 2 % PADS 6 each (has no administration in time range)  EPINEPHrine  (ADRENALIN ) 5 mg in NS 250 mL (0.02 mg/mL) premix infusion (has no administration in time range)  fentaNYL  (SUBLIMAZE ) injection 100 mcg (has no administration in time range)  fentaNYL  in NS (28mcg/ml) infusion-PREMIX (25 mcg/hr Intravenous New Bag/Given 01/11/24 0856)  propofol  (DIPRIVAN ) 1000 MG/100ML infusion (has no administration in time range)                                    Medical Decision Making Amount and/or Complexity of Data Reviewed Labs: ordered. Radiology: ordered.  Risk OTC drugs. Prescription drug management. Decision regarding hospitalization.   This patient presents to the ED for concern of cardiac arrest, this involves an extensive number of treatment options, and is a complaint that carries with it a high risk of complications and morbidity.  The differential diagnosis includes respiratory arrest, arrhythmia, ACS, PE   Co morbidities / Chronic  conditions that complicate the patient evaluation  DM, HTN, CHF, CKD, osteomyelitis s/p bilateral BKA's   Additional history obtained:  Additional history obtained from EMR External records from outside source obtained and reviewed including EMS, patient's wife   Lab Tests:  I Ordered, and personally interpreted labs.  The pertinent results include: Lactic acidosis Consistent with cardiac arrest.  Creatinine is increased from baseline.  Leukocytosis is present.  Troponin only mildly elevated.  BNP actually improved from prior lab work.   Imaging Studies ordered:  I ordered imaging studies including fentanyl  and propofol  for sedation I independently visualized and interpreted imaging which showed improved I agree with the radiologist interpretation   Cardiac Monitoring: / EKG:  The patient was maintained on a cardiac monitor.  I personally viewed and interpreted the cardiac monitored which showed an underlying rhythm of: Sinus rhythm   Problem List / ED Course / Critical interventions / Medication management  Patient presents after witnessed arrest at home.  Per wife, he was laying in bed and complaining of shortness of breath.  When she attempted to sit him up, he fell out of the bed and onto the floor.  While on the floor, he stopped breathing.  EMS was already on their way.  When EMS arrived, patient was in ventricular fibrillation.  CPR was initiated.  Patient was given a total of 3 defibrillatory shocks and 3 doses of epinephrine .  ROSC was obtained after 19 minutes.  Patient had SBP in the 80s.  He was kept on an epinephrine  gtt.  He has had some spontaneous respiratory effort following ROSC.  On arrival, patient is GCS 3.  He will occasionally have spontaneous movements of his mouth and neck.  Epinephrine  gtt. was continued, however, patient had elevated blood pressures and was able to be weaned off of pressors.  He does have bilateral breath sounds.  X-ray confirms ETT placement.   He was placed on ventilator.  Laboratory workup was initiated.  X-ray shows severe pulm edema and likely recurrence of pleural effusion.  Critical care and cardiology were consulted.  Given limited peripheral access, central line was placed.  Cardiology and critical care came to evaluate.  While in the ED, patient had recurrence of hypotension.  Epinephrine  gtt. was started.  Patient was admitted to ICU for further management. I ordered medication including propofol  and fentanyl  for sedation; epinephrine  for as needed pressure support Reevaluation of the patient after these medicines showed that the patient improved I have reviewed the patients home medicines and have made adjustments as needed   Consultations Obtained:  I requested consultation with the cardiologist and intensivist,  and discussed lab and imaging findings as well as pertinent plan - they recommend: [place] to ICU, cardiology will see in consult   Social Determinants of Health:  Lives at home with wife.  Wife reports poor medication adherence  CRITICAL CARE Performed by: Bernardino Fireman   Total critical care time: 35 minutes  Critical care time was exclusive of separately billable procedures and treating other patients.  Critical care was necessary to treat or prevent imminent or life-threatening deterioration.  Critical care was time spent personally by me on the following activities: development of treatment plan with patient and/or surrogate as well as nursing, discussions with consultants, evaluation of patient's response to treatment, examination of patient, obtaining history from patient or surrogate, ordering and performing treatments and interventions, ordering and review of laboratory studies, ordering and review of radiographic studies, pulse oximetry and re-evaluation of patient's condition.      Final diagnoses:  Cardiac arrest [facility])    ED Discharge Orders  None          Melvenia Motto, MD 01/11/24  [phone]

## 2024-01-11 NOTE — Consult Note (Signed)
 Cardiology Consultation   Patient ID: Alan Tapia. MRN: [id number]; DOB: January 26, 1976  Admit date: 01/11/2024 Date of Consult: 01/11/2024  PCP:  Patient, No Pcp Per   [facility] Providers Cardiologist:  Powell FORBES Sorrow, MD (Inactive)        Patient Profile: Alan Tapia. is a 48 y.o. male with a hx of chronic combined heart failure, PAD, T2DM, hypertension, bilateral BKA, morbid obesity who is being seen 01/11/2024 for the evaluation of cardiac arrest at the request of Dr. Melvenia.  History of Present Illness: [name] is a 48 year old male with the above medical history.  He had recent admission from 7/24 through 12/08/2023 with decompensated heart failure.  Was diuresed 15 L that admission.  Also underwent thoracentesis with 2 L removed.  Echocardiogram that admission showed EF 35 to 40%, mild RV dysfunction, no significant valvular disease.  His wife reports that on discharge he was not taking his medications.  He presents today with cardiac arrest.  In talking with his wife, she reports she was trying to help him up in bed because he was feeling short of breath lying flat.  While helping him up, he started to slide out of bed.  Wife called EMS to assist him back in the bed.  Wife reports that he shortly before EMS arrived, he went unresponsive.  When EMS arrived, he was pulseless and initial rhythm was V-fib.  He underwent 19 minutes of ACLS, received 3 shocks and 3 doses of epinephrine .  ROSC was achieved.  He was intubated and transferred to [facility] ED.  BP 184/110, pulse 112, SpO2 100% on ventilator.  Only labs available so far are CBC which show WBC 17.4, hemoglobin 11.6, platelets 488 and elevated lactate at 4.6.  EKG shows sinus rhythm, rate 99, low voltage, nonspecific T wave flattening; appears unchanged from prior EKG.   Past Medical History:  Diagnosis Date   Hypertension    Uncontrolled type 2 diabetes mellitus with hyperglycemia, with long-term current use of  insulin  (HCC) 06/06/2022    Past Surgical History:  Procedure Laterality Date   AMPUTATION Left 08/15/2022   Procedure: LEFT BELOW KNEE AMPUTATION;  Surgeon: Harden Jerona GAILS, MD;  Location: [facility] OR;  Service: Orthopedics;  Laterality: Left;   AMPUTATION Right 04/08/2023   Procedure: RIGHT BELOW KNEE AMPUTATION;  Surgeon: Harden Jerona GAILS, MD;  Location: [facility] OR;  Service: Orthopedics;  Laterality: Right;   IR THORACENTESIS ASP PLEURAL SPACE W/IMG GUIDE  12/04/2023     Scheduled Meds:  Chlorhexidine  Gluconate Cloth  6 each Topical Daily   Continuous Infusions:  epinephrine      fentaNYL  infusion INTRAVENOUS 25 mcg/hr (01/11/24 0856)   propofol  (DIPRIVAN ) infusion     PRN Meds: fentaNYL  (SUBLIMAZE ) injection  Allergies:    Allergies  Allergen Reactions   Fish Allergy Anaphylaxis, Hives and Other (See Comments)   Lasix  [Furosemide ] Hives   Cefepime  Other (See Comments)    Redness of the neck and cheek.  Concern for rash   Coreg  [Carvedilol ] Nausea Only   Fish-Derived Products    Vancomycin  Hives    Redman's-type rash on 08/13/22    Social History:   Social History   Socioeconomic History   Marital status: Married    Spouse name: Alan Tapia   Number of children: 0   Years of education: Not on file   Highest education level: Bachelor's degree (e.g., BA, AB, BS)  Occupational History    Comment: Just moved to [place] from [place]   Tobacco Use   Smoking status: Never   Smokeless tobacco: Never  Vaping Use   Vaping status: Never Used  Substance and Sexual Activity   Alcohol  use: No   Drug use: No   Sexual activity: Not on file  Other Topics Concern   Not on file  Social History Narrative   Not on file   Social Drivers of Health   Financial Resource Strain: Patient Unable To Answer (08/20/2022)   Overall Financial Resource Strain (CARDIA)    Difficulty of Paying Living Expenses: Patient unable to answer  Food Insecurity: No Food Insecurity (12/06/2023)   Hunger Vital Sign     Worried About Running Out of Food in the Last Year: Never true    Ran Out of Food in the Last Year: Never true  Transportation Needs: No Transportation Needs (12/06/2023)   PRAPARE - Administrator, Civil Service (Medical): No    Lack of Transportation (Non-Medical): No  Physical Activity: Not on file  Stress: Not on file  Social Connections: Not on file  Intimate Partner Violence: Not At Risk (12/06/2023)   Humiliation, Afraid, Rape, and Kick questionnaire    Fear of Current or Ex-Partner: No    Emotionally Abused: No    Physically Abused: No    Sexually Abused: No    Family History:    Family History  Problem Relation Age of Onset   Arthritis Mother    Allergies Father    Hypertension Father    Diabetes Father      ROS:  Please see the history of present illness.   All other ROS reviewed and negative.     Physical Exam/Data: Vitals:   01/11/24 0921 01/11/24 0924 01/11/24 0925 01/11/24 0927  BP: (!) 157/77 (!) 147/79  (!) 140/75  Pulse: 91  (!) 149 (!) 117  Resp: (!) 9 (!) 21 15 19   Temp:   (!) 94.5 F (34.7 C) (!) 94.9 F (34.9 C)  SpO2: 100%  100% 100%  Weight:       No intake or output data in the 24 hours ending 01/11/24 0931    01/11/2024    8:51 AM 12/08/2023    4:31 AM 12/07/2023    5:00 AM  Last 3 Weights  Weight (lbs) 330 lb 11 oz 351 lb 10.1 oz 344 lb 12.8 oz  Weight (kg) 150 kg 159.5 kg 156.4 kg     Body mass index is 40.25 kg/m.  General: Intubated Neck: Right IJ CVC in place Cardiac: Distant heart sounds, RRR; no murmur  Lungs: Mechanical breath sounds Abd: soft, nontender Ext: Status post bilateral BKA Skin: Cold Neuro: Intermittent myoclonus Psych: Unable to assess  EKG:  The EKG was personally reviewed and demonstrates:  EKG shows sinus rhythm, rate 99, low voltage, nonspecific T wave flattening; appears unchanged from prior EKG. Telemetry:  Telemetry was personally reviewed and demonstrates:  NSR  Relevant CV  Studies:   Laboratory Data: High Sensitivity Troponin:  No results for input(s): TROPONINIHS in the last 720 hours.   ChemistryNo results for input(s): NA, K, CL, CO2, GLUCOSE, BUN, CREATININE, CALCIUM , MG, GFRNONAA, GFRAA, ANIONGAP in the last 168 hours.  No results for input(s): PROT, ALBUMIN , AST, ALT, ALKPHOS, BILITOT in the last 168 hours. Lipids No results for input(s): CHOL, TRIG, HDL, LABVLDL, LDLCALC, CHOLHDL in the last 168 hours.  Hematology Recent Labs  Lab 01/11/24 0851  WBC 17.4*  RBC 4.52  HGB 11.6*  HCT 41.5  MCV 91.8  MCH 25.7*  MCHC 28.0*  RDW 16.0*  PLT 488*   Thyroid No results for input(s): TSH, FREET4 in the last 168 hours.  BNPNo results for input(s): BNP, PROBNP in the last 168 hours.  DDimer No results for input(s): DDIMER in the last 168 hours.  Radiology/Studies:  DG Abd Portable 1V Result Date: 01/11/2024 CLINICAL DATA:  48 year old male status post CPR. EXAM: PORTABLE ABDOMEN - 1 VIEW COMPARISON:  Portable chest today 0859 hours. FINDINGS: 4 portable views of the abdomen 0904 hours. Enteric tube seen looped over the lower neck on the comparison. Nonobstructed visible bowel gas pattern. Included lung bases suggest large left and moderate right pleural effusions as suspected on the comparison. No acute osseous abnormality identified. IMPRESSION: 1. Enteric tube seen looped over the lower neck on contemporary portable chest. Recommend removal and repositioning. 2. Nonobstructed visible bowel gas pattern. 3. Further evidence of large left and moderate right pleural effusions. Electronically Signed   By: VEAR Hurst M.D.   On: 01/11/2024 09:26   DG Chest Port 1 View Result Date: 01/11/2024 CLINICAL DATA:  48 year old male status post CPR. EXAM: PORTABLE CHEST 1 VIEW COMPARISON:  Portable chest 12/04/2023. FINDINGS: Portable AP supine view at 0859 hours. Substantially rotated to the left, more so than on the  comparison. Endotracheal tube is visible in the trachea, tip at the level the clavicles. An enteric tube is looped over the neck in the midline. Poor ventilation of the left lung. July appearance suggested loculated pleural fluid, might have progressed. Veiling opacity over the right lung also, with moderate size right pleural effusion suspected previously. Poorly visible cardiac and mediastinal contours. No displaced rib fracture identified. IMPRESSION: 1. Leftward rotated portable chest. 2. Endotracheal tube tip at the level the clavicles. Enteric tube looped at the hypopharynx or proximal esophagus. 3. Poor ventilation of the left lung, possibly progression of left pleural effusion seen last month. Ongoing right lung veiling opacity, also likely related to effusion. Electronically Signed   By: VEAR Hurst M.D.   On: 01/11/2024 09:25     Assessment and Plan:  Cardiac arrest: Had out-of-hospital arrest, initial rhythm V-fib.  Received 3 shocks and about 19 minutes of ACLS before ROSC obtained.  Not following commands, having myoclonus.  No ischemic changes on EKG.   - Suspect patient likely has ischemic cardiomyopathy.  Has known chronic systolic heart failure with EF 35 to 40%.  Has not had ischemic evaluation.  He does have a history of PAD and has had bilateral BKA and suspect likely has significant CAD.  Would monitor for recovery of neurologic function and can plan LHC pending recovery -Echocardiogram -Head CT ordered -Central line placed in ED, would check CVP and co-ox  Shock: Cardiogenic in setting of V-fib arrest as above.  Initially on epinephrine  drip, has been weaned off in ED.  Initial lactate 4.6.  Currently normotensive, will monitor  Acute on chronic combined heart failure: Echo 11/2023 showed EF 35 to 40%.  Has not had ischemic evaluation.  Recent admission 11/2023, was diuresed 15 L.  Care has been complicated by noncompliance with medications and outpatient appointments - Volume status  difficult to assess on exam given habitus but suspect likely significantly volume overloaded.  Was not taking his home diuretics.  Check CVP, coox as above and suspect will need aggressive diuresis - Pressors weaned off in ED, can fold in GDMT if BP remains stable  Elevated troponin: Initial troponin 30.  EKG without ischemic changes.  Suspect  likely demand ischemia in setting of cardiac arrest.  Can trend troponins.  Given initial rhythm V-fib, would warrant ischemic evaluation if neurologic recovery as above  AKI: Creatinine 1.98, increased from 1.38 on 7/29.  Likely due to shock following cardiac arrest as above.  Will monitor   CRITICAL CARE TIME: I have spent a total of 40 minutes with patient reviewing hospital notes, telemetry, EKGs, labs and examining the patient as well as establishing an assessment and plan that was discussed with the patient's wife as well as ED, PCCM, and Advanced Heart Failure teams.  > 50% of time was spent in direct patient care. The patient is critically ill with multi-organ system failure and requires high complexity decision making for assessment and support, frequent evaluation and titration of therapies, application of advanced monitoring technologies and extensive interpretation of multiple databases.    For questions or updates, please contact [facility] Please consult [email or web address] for contact info under    Signed, Lonni LITTIE Nanas, MD  01/11/2024 9:31 AM

## 2024-01-11 NOTE — ED Notes (Signed)
 Wife reports pt did fall today & was talking initially before she called 911.

## 2024-01-11 NOTE — ED Notes (Signed)
 EDP inserting Central line at this time.

## 2024-01-11 NOTE — ED Notes (Signed)
 X-ray at bedside.

## 2024-01-11 NOTE — H&P (Signed)
 NAME:  Alan Tapia., MRN:  [id number], DOB:  [birth year], LOS: 0 ADMISSION DATE:  01/11/2024, CONSULTATION DATE:  9/1 REFERRING MD:  dixon, CHIEF COMPLAINT:  cardiac arrest   History of Present Illness:  48 year old male patient with known history of severe peripheral vascular disease status post bilateral BKA, neuropathy, superobesity, type 2 diabetes, and hypertension, as well as newly diagnosed biventricular heart failure with estimated ejection fraction 35 to 40% with global hypokinesis, this was identified during hospitalization in July 2025, he was ultimately discharged to home on 7/29 after diuresing almost 15 L of fluid with IV bumetanide  (reported allergy to Lasix  with hives) was supposed to be discharged to home on oral bumetanide , losartan , isosorbide , beta-blockade and spironolactone .  Since discharge he has essentially been bedbound.  Unable to get up in wheelchair.  His wife has been doing approximately 2 hours a day of activity including placement of his prosthetics on his lower extremities.  Of note since discharge the patient has not been taking his Bumex , his hydralazine , or his Cozaar  stating to his wife he was allergic to these medications, and she witnessed what she felt was seizure activity following these meds  Presents on 9/1 status post VF arrest.  Per wife patient started to have notable physical accumulation of excess fluid and edema over about the last week's time, on 8/31 began to exhibit worsening shortness of breath and orthopnea, asking his wife to raise the bed up more stating he had fluid building up.  On the a.m. of 9/1 had slid out of bed, wife unsure if also hit his head.  She originally called EMS for lift assist.  But by the time EMS arrived he became pulseless and unresponsive, essentially as they walked in the door.  He was shocked 3 times, had 3 doses of epinephrine , estimated time to return of spontaneous circulation was 19 minutes he was intubated in the  field with a #7 endotracheal tube, initially placed on epinephrine  infusion, and transferred to the emergency room  In the emergency room the team was able to wean the epinephrine  infusion off.  Initially a right deltoid IO was placed.  He was unresponsive, exhibiting a cough reflex, not following commands, and demonstrating persistent bursts of myoclonic type activity in all extremities  A central line was placed by the emergency room physician, changes in the mechanical ventilator were made to optimize minute ventilation, cardiology was consulted, and diagnostic labs sent.  He was admitted to the critical care service  Pertinent  Medical History  Unresponsive with persistent myoclonic type activity.  Currently hemodynamically stable.  Significant Hospital Events: Including procedures, antibiotic start and stop dates in addition to other pertinent events   9/1 admitted postarrest myoclonic activity on arrival estimated time to resuscitation estimated at 19 minutes, initial rhythm VF  Interim History / Subjective:  Unresponsive  Objective    Blood pressure 122/72, pulse 86, temperature (!) 96.6 F (35.9 C), resp. rate (!) 27, weight (!) 150 kg, SpO2 100%.    Vent Mode: PRVC FiO2 (%):  [60 %-100 %] 60 % Set Rate:  [26 bmp-30 bmp] 30 bmp Vt Set:  [580 mL] 580 mL PEEP:  [5 cmH20-10 cmH20] 10 cmH20 Plateau Pressure:  [21 cmH20] 21 cmH20  No intake or output data in the 24 hours ending 01/11/24 1015 Filed Weights   01/11/24 0851  Weight: (!) 150 kg    Examination: General: Chronically ill obese 48 year old male patient currently on full ventilator support HENT: Normal  cephalic atraumatic endotracheal tubes in #7, some difficulty passing suction tubing given out of hospital set up pupils equal and reactive he does have scleral edema on the left currently has been side-lying on the left side for some time Lungs: Diminished throughout.  The endotracheal tube is just below the thoracic  inlet, the nasogastric tube was looped in the esophagus there was diffuse pulmonary edema, he has a follow-up film pending central line placement and tube correction Cardiovascular: Distant regular rate and rhythm Abdomen: Large hypoactive soft Extremities: Bilateral BKA, diffuse anasarca collecting in the abdomen, pelvis, and both legs, a right IO is placed in the right deltoid area, extremities are warm.  He has moisture related skin injury noted under the pannus of his abdomen, as well as both inguinal sites Neuro: Positive cough with suction attempt, no purposeful movement, not awake, bursts of what appears to be myoclonus GU: Due to void  Resolved problem list   Assessment and Plan  VF cardiac arrest in a patient with known biventricular heart failure Seen by cardiology Plan Optimize cardiac output Treating heart failure Will defer antiarrhythmics to cardiology team Continue telemetry Needs ischemia evaluation if he has neurological recovery  Lactic acidosis status postcardiac arrest Plan Trend  Acute on chronic biventricular heart failure with pulmonary edema, and history of hypertension - Almost certainly exacerbated by his medical noncompliance.  EF estimated at 35% during last hospitalization Plan Aggressive diuresis, utilizing IV Bumex  with similar diuretic regimen utilized during last hospital admit, given his allergy to Lasix  Central line has been placed, we will check central venous pressures as well as send Co. Oximetry He has been seen by cardiology, and advanced heart failure has been notified For now we will hold off on afterload reduction focusing on volume removal His noncompliance, seemingly driven by allergies and what his wife says are seizure.  Difficult to know if these have been true seizures or not  Acute hypoxic respiratory failure status postcardiac arrest in the setting of pulmonary edema Suspect he has untreated OHS as well as OSA likely contributing  to the RV component of his cardiac dysfunction  plan Full ventilator support Increasing PEEP to 10 giving body habitus, and to assist with preload reduction Increase minute ventilation given hypercarbia Full ventilator support, follow-up ABG  after recent changes Respiratory culture Will start IV Unasyn  empirically Volume reduction with diuresis Following up chest x-ray to assure endotracheal tube has been advanced  Acute metabolic encephalopathy status postcardiac arrest.  With a high level of concern for anoxic brain injury given what appears to be myoclonus Plan Stat CT head Obtain EEG Seizure precautions Neuroprotective measures to include: Head of bed elevated, avoiding fevers, keep euglycemic Sedation utilizing PAD protocol RASS goal -1  Pending neurological progression may need MRI  Stage II CKD, with acute on chronic renal failure, with hyperkalemia Baseline serum creatinine around 1.4 now 1.98 suspect cardiorenal syndrome driving this Plan Diuresis Maximize cardiac output Strict intake output Renal dose medications Serial chemistries  Peripheral vascular disease with bilateral BKA Plan Ensure pressure relief measures Diuresis Aspirin   Insulin -dependent diabetes type 2 Plan Sliding scale insulin  Goal glucose 140-180  Obesity class III Plan Needs weight loss RD consult  Medical noncompliance Plan Will need to identify ideal discharge medication regimen Not clear to me that the seizures that he has been exhibiting are medication related, not even sure they were seizures  Labs   CBC: Recent Labs  Lab 01/11/24 0851 01/11/24 0945  WBC 17.4*  --  HGB 11.6* 10.5*  HCT 41.5 31.0*  MCV 91.8  --   PLT 488*  --     Basic Metabolic Panel: Recent Labs  Lab 01/11/24 0945  NA 139  K 4.8   GFR: CrCl cannot be calculated (Patient's most recent lab result is older than the maximum 21 days allowed.). Recent Labs  Lab 01/11/24 0851 01/11/24 0855  WBC  17.4*  --   LATICACIDVEN  --  4.6*    Liver Function Tests: No results for input(s): AST, ALT, ALKPHOS, BILITOT, PROT, ALBUMIN  in the last 168 hours. No results for input(s): LIPASE, AMYLASE in the last 168 hours. No results for input(s): AMMONIA in the last 168 hours.  ABG    Component Value Date/Time   PHART 7.268 (L) 01/11/2024 0945   PCO2ART 49.2 (H) 01/11/2024 0945   PO2ART 221 (H) 01/11/2024 0945   HCO3 22.8 01/11/2024 0945   TCO2 24 01/11/2024 0945   ACIDBASEDEF 5.0 (H) 01/11/2024 0945   O2SAT 100 01/11/2024 0945     Coagulation Profile: Recent Labs  Lab 01/11/24 0851  INR 1.2    Cardiac Enzymes: No results for input(s): CKTOTAL, CKMB, CKMBINDEX, TROPONINI in the last 168 hours.  HbA1C: Hgb A1c MFr Bld  Date/Time Value Ref Range Status  12/04/2023 11:35 AM 4.6 (L) 4.8 - 5.6 % Final    Comment:    (NOTE) Diagnosis of Diabetes The following HbA1c ranges recommended by the American Diabetes Association (ADA) may be used as an aid in the diagnosis of diabetes mellitus.  Hemoglobin             Suggested A1C NGSP%              Diagnosis  <5.7                   Non Diabetic  5.7-6.4                Pre-Diabetic  >6.4                   Diabetic  <7.0                   Glycemic control for                       adults with diabetes.    04/07/2023 10:35 PM 6.8 (H) 4.8 - 5.6 % Final    Comment:    (NOTE) Pre diabetes:          5.7%-6.4%  Diabetes:              >6.4%  Glycemic control for   <7.0% adults with diabetes     CBG: Recent Labs  Lab 01/11/24 0923  GLUCAP 184*    Review of Systems:   Not able   Past Medical History:  He,  has a past medical history of Hypertension and Uncontrolled type 2 diabetes mellitus with hyperglycemia, with long-term current use of insulin  (HCC) (06/06/2022).   Surgical History:   Past Surgical History:  Procedure Laterality Date   AMPUTATION Left 08/15/2022   Procedure: LEFT BELOW  KNEE AMPUTATION;  Surgeon: Harden Jerona GAILS, MD;  Location: [facility] OR;  Service: Orthopedics;  Laterality: Left;   AMPUTATION Right 04/08/2023   Procedure: RIGHT BELOW KNEE AMPUTATION;  Surgeon: Harden Jerona GAILS, MD;  Location: [facility] OR;  Service: Orthopedics;  Laterality: Right;   IR THORACENTESIS ASP PLEURAL SPACE W/IMG GUIDE  12/04/2023  Social History:   reports that he has never smoked. He has never used smokeless tobacco. He reports that he does not drink alcohol  and does not use drugs.   Family History:  His family history includes Allergies in his father; Arthritis in his mother; Diabetes in his father; Hypertension in his father.   Allergies Allergies  Allergen Reactions   Fish Allergy Anaphylaxis, Hives and Other (See Comments)   Lasix  [Furosemide ] Hives   Cefepime  Other (See Comments)    Redness of the neck and cheek.  Concern for rash   Coreg  [Carvedilol ] Nausea Only   Fish-Derived Products    Vancomycin  Hives    Redman's-type rash on 08/13/22     Home Medications  Prior to Admission medications   Medication Sig Start Date End Date Taking? Authorizing Provider  acetaminophen  (TYLENOL ) 325 MG tablet Take 2 tablets (650 mg total) by mouth every 6 (six) hours as needed for mild pain (pain score 1-3) or headache. 12/08/23   Arrien, Elidia Sieving, MD  bumetanide  (BUMEX ) 1 MG tablet Take 1 tablet (1 mg total) by mouth daily. 12/08/23   Arrien, Mauricio Daniel, MD  hydrALAZINE  (APRESOLINE ) 50 MG tablet Take 1 tablet (50 mg total) by mouth every 8 (eight) hours. 12/08/23 01/07/24  Arrien, Elidia Sieving, MD  isosorbide  mononitrate (IMDUR ) 30 MG 24 hr tablet Take 1 tablet (30 mg total) by mouth daily. 12/09/23   Arrien, Mauricio Daniel, MD  losartan  (COZAAR ) 25 MG tablet Take 1 tablet (25 mg total) by mouth daily. 12/09/23   Arrien, Mauricio Daniel, MD  metoprolol  succinate (TOPROL -XL) 25 MG 24 hr tablet Take 0.5 tablets (12.5 mg total) by mouth daily. 12/08/23   Arrien, Mauricio Daniel, MD   pantoprazole  (PROTONIX ) 40 MG tablet Take 1 tablet (40 mg total) by mouth daily. 12/08/23   Arrien, Mauricio Daniel, MD  spironolactone  (ALDACTONE ) 25 MG tablet Take 0.5 tablets (12.5 mg total) by mouth daily. 12/09/23   Arrien, Elidia Sieving, MD     Critical care time: 63 minutes

## 2024-01-11 NOTE — Progress Notes (Signed)
 CT head reviewed.  There was no blood, but there was concern for possible anoxic injury Contacted by epileptologist EEG on spotcheck showing myoclonic seizures every few seconds, this is consistent with his physical exam Plan Start LTM Valproic  acid Propofol   Will eventually need formal neurology consult, to help with prognostication about potential anoxic injury, but if myoclonus persists on LTM in spite of current measures, will need neurology involvement sooner

## 2024-01-11 NOTE — Progress Notes (Signed)
 EEG complete - results pending

## 2024-01-11 NOTE — Progress Notes (Signed)
 LTM EEG hooked up and running - no initial skin breakdown - push button tested - [facility] monitoring.

## 2024-01-11 NOTE — ED Notes (Addendum)
 Pt having seizure-like jerking movements, Cards & Admitting at bedside & aware, wife at beside.

## 2024-01-11 NOTE — ED Notes (Signed)
 Cards at bedside

## 2024-01-11 NOTE — ED Notes (Signed)
 Pt not following commands yet & not on sedation.

## 2024-01-11 NOTE — ED Notes (Signed)
 Family at bedside.

## 2024-01-11 NOTE — Progress Notes (Signed)
 Patient transported to 2H02 from ED without complications. RN at bedside.

## 2024-01-11 NOTE — Procedures (Signed)
 Patient Name: Alan Tapia.  MRN: [id number]  Epilepsy Attending: Arlin MALVA Krebs  Referring Physician/Provider: Jenna Maude BRAVO, NP  Date: 01/11/2024 Duration: 23.22 mins  Patient history: 48yo M s/p cardiac arrest. EEG to evaluate for seizure  Level of alertness: comatose  AEDs during EEG study: Propofol   Technical aspects: This EEG study was done with scalp electrodes positioned according to the 10-20 International system of electrode placement. Electrical activity was reviewed with band pass filter of 1-70Hz , sensitivity of 7 uV/mm, display speed of 14mm/sec with a 60Hz  notched filter applied as appropriate. EEG data were recorded continuously and digitally stored.  Video monitoring was available and reviewed as appropriate.  Description: Patient was noted to have episodes of brief sudden eye opening with whole body jerking every 2-20 seconds. Concomitant EEG showed generalized polyspikes consistent with myoclonic seizures.  In between seizures EEG showed generalized background suppression. Hyperventilation and photic stimulation were not performed.     ABNORMALITY - Myoclonic seizure, generalized - Background suppression, generalized  IMPRESSION: Patient was noted to have myoclonic seizures every 2-20 seconds. Additionally there was evidence of profound diffuse encephalopathy. In the setting of cardiac arrest, this EEG pattern is suggestive of anoxic/hypoxic brain injury.  Dr.Hunsucker and Maude Jenna was notified.  Nekayla Heider O Carlin Attridge

## 2024-01-12 ENCOUNTER — Inpatient Hospital Stay (HOSPITAL_COMMUNITY)

## 2024-01-12 DIAGNOSIS — I469 Cardiac arrest, cause unspecified: Secondary | ICD-10-CM | POA: Diagnosis not present

## 2024-01-12 DIAGNOSIS — R569 Unspecified convulsions: Secondary | ICD-10-CM | POA: Diagnosis not present

## 2024-01-12 DIAGNOSIS — G931 Anoxic brain damage, not elsewhere classified: Secondary | ICD-10-CM

## 2024-01-12 LAB — POCT I-STAT 7, (LYTES, BLD GAS, ICA,H+H)
Acid-base deficit: 1 mmol/L (ref 0.0–2.0)
Bicarbonate: 22.7 mmol/L (ref 20.0–28.0)
Calcium, Ion: 1.16 mmol/L (ref 1.15–1.40)
HCT: 30 % — ABNORMAL LOW (ref 39.0–52.0)
Hemoglobin: 10.2 g/dL — ABNORMAL LOW (ref 13.0–17.0)
O2 Saturation: 91 %
Patient temperature: 37
Potassium: 4.5 mmol/L (ref 3.5–5.1)
Sodium: 141 mmol/L (ref 135–145)
TCO2: 24 mmol/L (ref 22–32)
pCO2 arterial: 32.6 mmHg (ref 32–48)
pH, Arterial: 7.45 (ref 7.35–7.45)
pO2, Arterial: 58 mmHg — ABNORMAL LOW (ref 83–108)

## 2024-01-12 LAB — BASIC METABOLIC PANEL WITH GFR
Anion gap: 9 (ref 5–15)
BUN: 22 mg/dL — ABNORMAL HIGH (ref 6–20)
CO2: 18 mmol/L — ABNORMAL LOW (ref 22–32)
Calcium: 6.1 mg/dL — CL (ref 8.9–10.3)
Chloride: 117 mmol/L — ABNORMAL HIGH (ref 98–111)
Creatinine, Ser: 1.7 mg/dL — ABNORMAL HIGH (ref 0.61–1.24)
GFR, Estimated: 49 mL/min — ABNORMAL LOW (ref >60)
Glucose, Bld: 60 mg/dL — ABNORMAL LOW (ref 70–99)
Potassium: 3.5 mmol/L (ref 3.5–5.1)
Sodium: 144 mmol/L (ref 135–145)

## 2024-01-12 LAB — CBC
HCT: 27.6 % — ABNORMAL LOW (ref 39.0–52.0)
Hemoglobin: 8.4 g/dL — ABNORMAL LOW (ref 13.0–17.0)
MCH: 26.1 pg (ref 26.0–34.0)
MCHC: 30.4 g/dL (ref 30.0–36.0)
MCV: 85.7 fL (ref 80.0–100.0)
Platelets: 274 K/uL (ref 150–400)
RBC: 3.22 MIL/uL — ABNORMAL LOW (ref 4.22–5.81)
RDW: 16.2 % — ABNORMAL HIGH (ref 11.5–15.5)
WBC: 14.2 K/uL — ABNORMAL HIGH (ref 4.0–10.5)
nRBC: 0 % (ref 0.0–0.2)

## 2024-01-12 LAB — GLUCOSE, CAPILLARY
Glucose-Capillary: 60 mg/dL — ABNORMAL LOW (ref 70–99)
Glucose-Capillary: 67 mg/dL — ABNORMAL LOW (ref 70–99)
Glucose-Capillary: 70 mg/dL (ref 70–99)
Glucose-Capillary: 72 mg/dL (ref 70–99)
Glucose-Capillary: 76 mg/dL (ref 70–99)
Glucose-Capillary: 76 mg/dL (ref 70–99)
Glucose-Capillary: 76 mg/dL (ref 70–99)
Glucose-Capillary: 87 mg/dL (ref 70–99)
Glucose-Capillary: 90 mg/dL (ref 70–99)

## 2024-01-12 LAB — URINE CULTURE: Culture: NO GROWTH

## 2024-01-12 LAB — BRAIN NATRIURETIC PEPTIDE: B Natriuretic Peptide: 539.7 pg/mL — ABNORMAL HIGH (ref 0.0–100.0)

## 2024-01-12 LAB — MAGNESIUM
Magnesium: 1.3 mg/dL — ABNORMAL LOW (ref 1.7–2.4)
Magnesium: 2.6 mg/dL — ABNORMAL HIGH (ref 1.7–2.4)

## 2024-01-12 LAB — PROCALCITONIN: Procalcitonin: 4.54 ng/mL

## 2024-01-12 LAB — TRIGLYCERIDES: Triglycerides: 111 mg/dL (ref <150)

## 2024-01-12 LAB — COOXEMETRY PANEL
Carboxyhemoglobin: 1.6 % — ABNORMAL HIGH (ref 0.5–1.5)
Methemoglobin: <0.7 % (ref 0.0–1.5)
O2 Saturation: 87.8 %
Total hemoglobin: 11 g/dL — ABNORMAL LOW (ref 12.0–16.0)

## 2024-01-12 LAB — PHOSPHORUS: Phosphorus: 2.6 mg/dL (ref 2.5–4.6)

## 2024-01-12 MED ORDER — CALCIUM GLUCONATE-NACL 2-0.675 GM/100ML-% IV SOLN
2.0000 g | Freq: Once | INTRAVENOUS | Status: AC
  Administered 2024-01-12: 2000 mg via INTRAVENOUS
  Filled 2024-01-12: qty 100

## 2024-01-12 MED ORDER — MAGNESIUM SULFATE 4 GM/100ML IV SOLN
4.0000 g | Freq: Once | INTRAVENOUS | Status: AC
  Administered 2024-01-12: 4 g via INTRAVENOUS
  Filled 2024-01-12: qty 100

## 2024-01-12 MED ORDER — DEXTROSE 50 % IV SOLN
INTRAVENOUS | Status: AC
  Filled 2024-01-12: qty 50

## 2024-01-12 MED ORDER — DEXTROSE 50 % IV SOLN
1.0000 | Freq: Once | INTRAVENOUS | Status: AC
  Administered 2024-01-12: 50 mL via INTRAVENOUS

## 2024-01-12 MED ORDER — METOLAZONE 2.5 MG PO TABS
2.5000 mg | ORAL_TABLET | Freq: Once | ORAL | Status: AC
  Administered 2024-01-12: 2.5 mg
  Filled 2024-01-12: qty 1

## 2024-01-12 MED ORDER — DEXTROSE 50 % IV SOLN
1.0000 | Freq: Once | INTRAVENOUS | Status: AC
Start: 2024-01-13 — End: 2024-01-13

## 2024-01-12 MED ORDER — MAGNESIUM SULFATE 2 GM/50ML IV SOLN
2.0000 g | Freq: Once | INTRAVENOUS | Status: AC
  Administered 2024-01-12: 2 g via INTRAVENOUS
  Filled 2024-01-12: qty 50

## 2024-01-12 MED ORDER — FUROSEMIDE 10 MG/ML IJ SOLN
20.0000 mg/h | INTRAVENOUS | Status: DC
  Administered 2024-01-12 – 2024-01-14 (×6): 20 mg/h via INTRAVENOUS
  Filled 2024-01-12 (×7): qty 20

## 2024-01-12 MED ORDER — DEXTROSE 10 % IV SOLN: INTRAVENOUS | Status: DC

## 2024-01-12 MED ORDER — POTASSIUM CHLORIDE 20 MEQ PO PACK
40.0000 meq | PACK | Freq: Once | ORAL | Status: AC
  Administered 2024-01-12: 40 meq
  Filled 2024-01-12: qty 2

## 2024-01-12 MED ORDER — DEXTROSE 50 % IV SOLN: 12.5000 g | INTRAVENOUS | Status: AC

## 2024-01-12 MED ORDER — SODIUM CHLORIDE 0.9 % IV SOLN
2.0000 g | INTRAVENOUS | Status: DC
  Administered 2024-01-12 – 2024-01-13 (×2): 2 g via INTRAVENOUS
  Filled 2024-01-12 (×3): qty 20

## 2024-01-12 MED ORDER — DEXTROSE 50 % IV SOLN
INTRAVENOUS | Status: AC
  Administered 2024-01-13: 50 mL via INTRAVENOUS
  Filled 2024-01-12: qty 50

## 2024-01-12 MED ORDER — FUROSEMIDE 10 MG/ML IJ SOLN
120.0000 mg | Freq: Once | INTRAMUSCULAR | Status: AC
  Administered 2024-01-12: 120 mg via INTRAVENOUS

## 2024-01-12 MED ORDER — FUROSEMIDE 10 MG/ML IJ SOLN
120.0000 mg | Freq: Once | INTRAVENOUS | Status: DC
  Filled 2024-01-12: qty 12

## 2024-01-12 NOTE — Progress Notes (Signed)
 eLink Physician-Brief Progress Note Patient Name: Alan Tapia. DOB: 04/07/1976 MRN: [id number]   Date of Service  01/12/2024  HPI/Events of Note  CBG 60 despite continuous dextrose  infusion  eICU Interventions  Increased dextrose  infusion to 50 cc 1 ampoule D50 per protocol     Intervention Category Intermediate Interventions: Hyperglycemia - evaluation and treatment  Lynzy Rawles 01/12/2024, 11:55 PM

## 2024-01-12 NOTE — Progress Notes (Signed)
EEG maint complete.  ?

## 2024-01-12 NOTE — Progress Notes (Signed)
 [facility] ADULT ICU REPLACEMENT PROTOCOL   The patient does apply for the [facility] Adult ICU Electrolyte Replacment Protocol based on the criteria listed below:   1.Exclusion criteria: TCTS, ECMO, Dialysis, and Myasthenia Gravis patients 2. Is GFR >/= 30 ml/min? Yes.    Patient's GFR today is 49 3. Is SCr </= 2? Yes.   Patient's SCr is 1.70 mg/dL 4. Did SCr increase >/= 0.5 in 24 hours? No. 5.Pt's weight >40kg  Yes.   6. Abnormal electrolyte(s): K+ 3.5, Mag 1.3  7. Electrolytes replaced per protocol 8.  Call MD STAT for K+ </= 2.5, Phos </= 1, or Mag </= 1 Physician:  Dr. Haze Rummer, Rec[place] ORN 01/12/2024 6:18 AM

## 2024-01-12 NOTE — Progress Notes (Signed)
 NAME:  Alan Tapia., MRN:  [id number], DOB:  09-12-1975, LOS: 1 ADMISSION DATE:  01/11/2024, CONSULTATION DATE:  9/1 REFERRING MD:  dixon, CHIEF COMPLAINT:  cardiac arrest   History of Present Illness:  48 year old male patient with known history of severe peripheral vascular disease status post bilateral BKA, neuropathy, superobesity, type 2 diabetes, and hypertension, as well as newly diagnosed biventricular heart failure with estimated ejection fraction 35 to 40% with global hypokinesis, this was identified during hospitalization in July 2025, he was ultimately discharged to home on 7/29 after diuresing almost 15 L of fluid with IV bumetanide  (reported allergy to Lasix  with hives) was supposed to be discharged to home on oral bumetanide , losartan , isosorbide , beta-blockade and spironolactone .  Since discharge he has essentially been bedbound.  Unable to get up in wheelchair.  His wife has been doing approximately 2 hours a day of activity including placement of his prosthetics on his lower extremities.  Of note since discharge the patient has not been taking his Bumex , his hydralazine , or his Cozaar  stating to his wife he was allergic to these medications, and she witnessed what she felt was seizure activity following these meds  Presents on 9/1 status post VF arrest.  Per wife patient started to have notable physical accumulation of excess fluid and edema over about the last week's time, on 8/31 began to exhibit worsening shortness of breath and orthopnea, asking his wife to raise the bed up more stating he had fluid building up.  On the a.m. of 9/1 had slid out of bed, wife unsure if also hit his head.  She originally called EMS for lift assist.  But by the time EMS arrived he became pulseless and unresponsive, essentially as they walked in the door.  He was shocked 3 times, had 3 doses of epinephrine , estimated time to return of spontaneous circulation was 19 minutes he was intubated in the  field with a #7 endotracheal tube, initially placed on epinephrine  infusion, and transferred to the emergency room  In the emergency room the team was able to wean the epinephrine  infusion off.  Initially a right deltoid IO was placed.  He was unresponsive, exhibiting a cough reflex, not following commands, and demonstrating persistent bursts of myoclonic type activity in all extremities  A central line was placed by the emergency room physician, changes in the mechanical ventilator were made to optimize minute ventilation, cardiology was consulted, and diagnostic labs sent.  He was admitted to the critical care service  Pertinent  Medical History  Unresponsive with persistent myoclonic type activity.  Currently hemodynamically stable.  Significant Hospital Events: Including procedures, antibiotic start and stop dates in addition to other pertinent events   9/1 admitted postarrest myoclonic activity on arrival estimated time to resuscitation estimated at 19 minutes, initial rhythm VF 9/1 LTM >anoxic injury  Interim History / Subjective:  Unresponsive, having active myoclonus of face/eyes/neck on 15 Propofol  Wife at bedside  Objective    Blood pressure (!) 156/86, pulse 91, temperature 99 F (37.2 C), resp. rate (!) 29, height 5' 10 (1.778 m), weight (!) 163.4 kg, SpO2 100%. CVP:  [7 mmHg-25 mmHg] 17 mmHg  Vent Mode: PRVC FiO2 (%):  [40 %-60 %] 60 % Set Rate:  [28 bmp-30 bmp] 28 bmp Vt Set:  [580 mL] 580 mL PEEP:  [10 cmH20] 10 cmH20 Plateau Pressure:  [23 cmH20-28 cmH20] 23 cmH20   Intake/Output Summary (Last 24 hours) at 01/12/2024 0856 Last data filed at 01/12/2024 0800 Gross  per 24 hour  Intake 1137.38 ml  Output 1125 ml  Net 12.38 ml   Filed Weights   01/11/24 0851 01/12/24 0500  Weight: (!) 150 kg (!) 163.4 kg    Examination: General: Young adult male, critically ill. Neuro: Sedated on 15 Propofol . Not responsive but having active myoclonus of eyes/face/neck  muscles. HEENT: [place]/AT. Sclerae anicteric. ETT in place. Cardiovascular: RRR, no M/R/G.  Lungs: Respirations shallow and unlabored. Breath sounds diminished. Abdomen: Anasarca. Morbidly obese. BS not appreciated due to body habitus. Musculoskeletal: Chronic edema bilaterally, bilateral BKA's.   Assessment and Plan   VF cardiac arrest in a patient with known biventricular heart failure - 19 minutes of ACLS before ROSC Seen by cardiology Plan Optimize cardiac output, currently acceptable coox 88 Will defer antiarrhythmics to cardiology team Needs ischemia evaluation if he has neurological recovery but unfortunately current signs point to severe anoxia  Acute on chronic biventricular heart failure with pulmonary edema, and history of hypertension - Almost certainly exacerbated by his medical noncompliance.  EF estimated at 35% during last hospitalization Plan AHF starting aggressive diuresis with continuous infusion His noncompliance, seemingly driven by allergies and what his wife says are seizure.  Difficult to know if these have been true seizures or not  Acute hypoxic respiratory failure status postcardiac arrest in the setting of pulmonary edema Suspect he has untreated OHS as well as OSA likely contributing to the RV component of his cardiac dysfunction  plan Full ventilator support Increasing PEEP to 10 giving body habitus, and to assist with preload reduction Increase minute ventilation given hypercarbia Full ventilator support, follow-up ABG  after recent changes Respiratory culture Will start IV Unasyn  empirically Volume reduction with diuresis Following up chest x-ray to assure endotracheal tube has been advanced  Acute metabolic encephalopathy status postcardiac arrest with CT already showing anoxic injury and LTM with myoclonus/anoxia  Plan Continue LTM Continue empiric Valproate Ongoing goals of care discussions, see below Neuroprotective measures to include:  Head of bed elevated, avoiding fevers, keep euglycemic Pending neurological progression may need MRI  Stage II CKD, with acute on chronic renal failure, with hyperkalemia Baseline serum creatinine around 1.4 now 1.98 suspect cardiorenal syndrome driving this Hypomagnesemia Plan Aggressive diuresis as above Replace lytes Follow BMP  Peripheral vascular disease with bilateral BKA Plan Ensure pressure relief measures Diuresis Aspirin   Insulin -dependent diabetes type 2 Plan Sliding scale insulin  Goal glucose 140-180  Obesity class III Plan Needs weight loss RD consult  Medical noncompliance Plan Will need to identify ideal discharge medication regimen Not clear to me that the seizures that he has been exhibiting are medication related, not even sure they were seizures  Goals of care Plan Given CT and LTM findings along with clinical exam, recommended DNR and consideration of compassionate extubation if no improvement in myoclonus over next 24 hours. Suspect this will worsen but needs time for accurate neuro prognostication. We do have fathers phone number now so I will attempt to call him this morning to explain the gravity of the situation.   Critical care time: 35 minutes    Sammi Gore, [place] [facility] For pager details, please see AMION or use Epic chat  After 1900, please call [facility] for cross coverage needs 01/12/2024, 9:09 AM

## 2024-01-12 NOTE — Plan of Care (Signed)

## 2024-01-12 NOTE — Progress Notes (Signed)
 Advanced Heart Failure Rounding Note  Cardiologist: None  Chief Complaint: Out of hospital cardiac arrest  Subjective:    Remains intubated.  He is having frequent myoclonic activity.  His propofol  has been increased today by CCM due to his myoclonus.  Echocardiogram shows ejection fraction of 40%.  He is not requiring any pressors.  No inotropes.  His rhythm has been stable.  His CT scan showed early anoxic changes.  His wife is at his bedside.   Objective:   Weight Range: (!) 163.4 kg Body mass index is 51.69 kg/m.   Vital Signs:   Temp:  [94.5 F (34.7 C)-100 F (37.8 C)] 98.8 F (37.1 C) (09/02 0430) Pulse Rate:  [26-149] 88 (09/02 0430) Resp:  [9-31] 25 (09/02 0430) BP: (111-191)/(57-110) 141/71 (09/02 0430) SpO2:  [91 %-100 %] 100 % (09/02 0430) FiO2 (%):  [40 %-100 %] 60 % (09/02 0440) Weight:  [150 kg-163.4 kg] 163.4 kg (09/02 0500) Last BM Date : 01/11/24  Weight change: Filed Weights   01/11/24 0851 01/12/24 0500  Weight: (!) 150 kg (!) 163.4 kg    Intake/Output:   Intake/Output Summary (Last 24 hours) at 01/12/2024 0701 Last data filed at 01/12/2024 0400 Gross per 24 hour  Intake 708.54 ml  Output 350 ml  Net 358.54 ml      Physical Exam    General: Critically ill-appearing.  Sedated on vent. HEENT: ET tube in place.  EEG electrodes present.  Plus vertical nystagmus. Neck: Supple right IJ triple-lumen catheter Cor: PMI nondisplaced. Regular rate & rhythm. No rubs, gallops or murmurs. Lungs: clear Abdomen: soft, nontender, nondistended. No hepatosplenomegaly. No bruits or masses. Good bowel sounds. Extremities: Status post bilateral BKA 2-3+ edema in thighs bilaterally. Neuro: Comatose on vent.  Unresponsive.  Myoclonic jerks.  Telemetry   Sinus 80s to 90s personally reviewed no further VT/VF.   Labs    CBC Recent Labs    01/11/24 0851 01/11/24 0945 01/12/24 0347 01/12/24 0350  WBC 17.4*  --   --  14.2*  HGB 11.6*   < > 10.2*  8.4*  HCT 41.5   < > 30.0* 27.6*  MCV 91.8  --   --  85.7  PLT 488*  --   --  274   < > = values in this interval not displayed.   Basic Metabolic Panel Recent Labs    90/98/74 0851 01/11/24 0945 01/12/24 0347 01/12/24 0350  NA 142   < > 141 144  K 5.2*   < > 4.5 3.5  CL 110  --   --  117*  CO2 16*  --   --  18*  GLUCOSE 211*  --   --  60*  BUN 25*  --   --  22*  CREATININE 1.98*  --   --  1.70*  CALCIUM  7.8*  --   --  6.1*  MG 2.1  --   --  1.3*  PHOS 6.1*  --   --  2.6   < > = values in this interval not displayed.   Liver Function Tests Recent Labs    01/11/24 0851  AST 37  ALT 16  ALKPHOS 141*  BILITOT 0.5  PROT 5.9*  ALBUMIN  1.7*   No results for input(s): LIPASE, AMYLASE in the last 72 hours. Cardiac Enzymes Recent Labs    01/11/24 1145  CKTOTAL 409*  CKMB 12.2*    BNP: BNP (last 3 results) Recent Labs    12/03/23  2010 01/11/24 0854 01/12/24 0350  BNP 898.0* 605.9* 539.7*    ProBNP (last 3 results) No results for input(s): PROBNP in the last 8760 hours.   D-Dimer No results for input(s): DDIMER in the last 72 hours. Hemoglobin A1C No results for input(s): HGBA1C in the last 72 hours. Fasting Lipid Panel Recent Labs    01/12/24 0350  TRIG 111   Thyroid Function Tests No results for input(s): TSH, T4TOTAL, T3FREE, THYROIDAB in the last 72 hours.  Invalid input(s): FREET3  Other results:   Imaging    DG Chest Port 1 View Result Date: 01/12/2024 EXAM: 1 VIEW XRAY OF THE CHEST 01/12/2024 05:29:00 AM COMPARISON: Radiograph of the chest dated 01/11/2024. CLINICAL HISTORY: Respiratory failure. FINDINGS: LUNGS AND PLEURA: Persistent diffuse hazy opacification of the lower lung zones, worse on the left. Left lateral pleural effusion/pleural thickening, as before. HEART AND MEDIASTINUM: No acute abnormality of the cardiac and mediastinal silhouettes. BONES AND SOFT TISSUES: No acute osseous abnormality. LINES AND TUBES:  Endotracheal tube and right internal jugular central venous line remain in place. IMPRESSION: 1. Persistent diffuse hazy opacification of the lower lung zones, worse on the left. 2. Left lateral pleural effusion/pleural thickening, as before. Electronically signed by: Evalene Coho MD 01/12/2024 05:59 AM EDT RP Workstation: [id number]   DG CHEST PORT 1 VIEW Result Date: 01/11/2024 CLINICAL DATA:  Respiratory failure EXAM: PORTABLE CHEST 1 VIEW COMPARISON:  01/11/2024, 11:08 a.m. FINDINGS: Large pleural effusion on the left and smaller pleural effusion on the right. Pulmonary vascular congestion and evidence of interstitial edema. Left basilar consolidation with air bronchograms. Right base subsegmental atelectasis or mild consolidation. Right IJ CVC tip overlies proximal SVC. NG tube extends below the diaphragm and off the x-ray. Endotracheal tube tip at the thoracic inlet. IMPRESSION: Findings consistent with CHF. Left-greater-than-right pleural effusions. Left basilar consolidation. Electronically Signed   By: Fonda Field M.D.   On: 01/11/2024 16:00   DG Abd Portable 1V Result Date: 01/11/2024 CLINICAL DATA:  Tube placement. EXAM: PORTABLE ABDOMEN - 1 VIEW COMPARISON:  01/11/2024, 9 a.m. FINDINGS: Imaged including the upper abdomen and lower chest demonstrates an NG tube. The tip is superimposed with left upper quadrant, below the diaphragm. IMPRESSION: N/OGT in place. Electronically Signed   By: Fonda Field M.D.   On: 01/11/2024 15:59   EEG adult Result Date: 01/11/2024 Shelton Arlin KIDD, MD     01/11/2024  3:39 PM Patient Name: Alan Tapia. MRN: [id number] Epilepsy Attending: Arlin KIDD Shelton Referring Physician/Provider: Jenna Maude BRAVO, NP Date: 01/11/2024 Duration: 23.22 mins Patient history: 48yo M s/p cardiac arrest. EEG to evaluate for seizure Level of alertness: comatose AEDs during EEG study: Propofol  Technical aspects: This EEG study was done with scalp electrodes positioned  according to the 10-20 International system of electrode placement. Electrical activity was reviewed with band pass filter of 1-70Hz , sensitivity of 7 uV/mm, display speed of 74mm/sec with a 60Hz  notched filter applied as appropriate. EEG data were recorded continuously and digitally stored.  Video monitoring was available and reviewed as appropriate. Description: Patient was noted to have episodes of brief sudden eye opening with whole body jerking every 2-20 seconds. Concomitant EEG showed generalized polyspikes consistent with myoclonic seizures.  In between seizures EEG showed generalized background suppression. Hyperventilation and photic stimulation were not performed.   ABNORMALITY - Myoclonic seizure, generalized - Background suppression, generalized IMPRESSION: Patient was noted to have myoclonic seizures every 2-20 seconds. Additionally there was evidence of profound diffuse encephalopathy. In the  setting of cardiac arrest, this EEG pattern is suggestive of anoxic/hypoxic brain injury. Dr.Hunsucker and Maude Banner was notified. Arlin MALVA Krebs   ECHOCARDIOGRAM COMPLETE Result Date: 01/11/2024    ECHOCARDIOGRAM REPORT   Patient Name:   Jaidyn Kuhl. Date of Exam: 01/11/2024 Medical Rec #:  [id number]          Height:       76.0 in Accession #:    [id number]         Weight:       330.7 lb Date of Birth:  27-Jul-1975          BSA:          2.743 m Patient Age:    48 years           BP:           122/72 mmHg Patient Gender: M                  HR:           68 bpm. Exam Location:  Inpatient Procedure: 2D Echo, Cardiac Doppler and Color Doppler (Both Spectral and Color            Flow Doppler were utilized during procedure). Indications:    Cardiac arrest I46.9  History:        Patient has prior history of Echocardiogram examinations, most                 recent 12/04/2023. Risk Factors:Diabetes and Hypertension.  Sonographer:    Jayson Gaskins Referring Phys: [phone] PETER E BABCOCK IMPRESSIONS  1. Left  ventricular ejection fraction, by estimation, is 40%. The left ventricle has mildly decreased function. The left ventricle demonstrates global hypokinesis. Left ventricular diastolic parameters are consistent with Grade II diastolic dysfunction (pseudonormalization).  2. Right ventricular systolic function is mildly reduced. The right ventricular size is normal. Tricuspid regurgitation signal is inadequate for assessing PA pressure.  3. Left atrial size was mildly dilated.  4. Large pleural effusion in the left lateral region.  5. The mitral valve is normal in structure. No evidence of mitral valve regurgitation.  6. The aortic valve is tricuspid. Aortic valve regurgitation is not visualized. Comparison(s): Prior images reviewed side by side. The left ventricular function is unchanged. The right ventricular systolic function is worse. Left pleural effusion is larger. FINDINGS  Left Ventricle: Left ventricular ejection fraction, by estimation, is 40%. The left ventricle has mildly decreased function. The left ventricle demonstrates global hypokinesis. The left ventricular internal cavity size was normal in size. There is no left ventricular hypertrophy. Left ventricular diastolic parameters are consistent with Grade II diastolic dysfunction (pseudonormalization). Right Ventricle: The right ventricular size is normal. No increase in right ventricular wall thickness. Right ventricular systolic function is mildly reduced. Tricuspid regurgitation signal is inadequate for assessing PA pressure. Left [facility]: Left atrial size was mildly dilated. Right [facility]: Right atrial size was normal in size. Pericardium: Trivial pericardial effusion is present. Mitral Valve: The mitral valve is normal in structure. No evidence of mitral valve regurgitation. Tricuspid Valve: The tricuspid valve is not well visualized. Tricuspid valve regurgitation is not demonstrated. Aortic Valve: The aortic valve is tricuspid. Aortic valve  regurgitation is not visualized. Aortic valve mean gradient measures 2.0 mmHg. Aortic valve peak gradient measures 4.0 mmHg. Aortic valve area, by VTI measures 3.23 cm. Pulmonic Valve: The pulmonic valve was grossly normal. Pulmonic valve regurgitation is not visualized. No evidence of pulmonic stenosis. Aorta: The  aortic root and ascending aorta are structurally normal, with no evidence of dilitation. Venous: IVC assessment for right atrial pressure unable to be performed due to mechanical ventilation. IAS/Shunts: The interatrial septum was not well visualized. Additional Comments: There is a large pleural effusion in the left lateral region.  LEFT VENTRICLE PLAX 2D LVIDd:         5.60 cm   Diastology LVIDs:         4.50 cm   LV e' medial:    4.57 cm/s LV PW:         1.10 cm   LV E/e' medial:  14.3 LV IVS:        1.00 cm   LV e' lateral:   5.44 cm/s LVOT diam:     2.10 cm   LV E/e' lateral: 12.0 LV SV:         69 LV SV Index:   25 LVOT Area:     3.46 cm  RIGHT VENTRICLE RV S prime:     9.14 cm/s TAPSE (M-mode): 1.7 cm LEFT ATRIUM             Index        RIGHT ATRIUM           Index LA Vol (A2C):   44.8 ml 16.33 ml/m  RA Area:     16.70 cm LA Vol (A4C):   41.3 ml 15.06 ml/m  RA Volume:   50.70 ml  18.48 ml/m LA Biplane Vol: 43.5 ml 15.86 ml/m  AORTIC VALVE AV Area (Vmax):    3.21 cm AV Area (Vmean):   3.31 cm AV Area (VTI):     3.23 cm AV Vmax:           100.00 cm/s AV Vmean:          74.700 cm/s AV VTI:            0.212 m AV Peak Grad:      4.0 mmHg AV Mean Grad:      2.0 mmHg LVOT Vmax:         92.60 cm/s LVOT Vmean:        71.400 cm/s LVOT VTI:          0.198 m LVOT/AV VTI ratio: 0.93  AORTA Ao Root diam: 2.60 cm MITRAL VALVE MV Area (PHT): 2.22 cm    SHUNTS MV Decel Time: 341 msec    Systemic VTI:  0.20 m MV E velocity: 65.50 cm/s  Systemic Diam: 2.10 cm MV A velocity: 46.60 cm/s MV E/A ratio:  1.41 Mihai Croitoru MD Electronically signed by Jerel Balding MD Signature Date/Time: 01/11/2024/1:42:07 PM     Final    DG Chest Portable 1 View Result Date: 01/11/2024 CLINICAL DATA:  post CVC placement EXAM: PORTABLE CHEST 1 VIEW COMPARISON:  January 11, 2024. FINDINGS: Evaluation is limited by positioning. The cardiomediastinal silhouette is unchanged in contour.ETT tip terminates approximately 6.9 cm above the carina. The enteric tube courses through the chest to the abdomen beyond the field-of-view. RIGHT neck CVC tip is somewhat curved in course with the tip terminating over the expected area of the brachiocephalic confluence with the SVC. Large LEFT pleural effusion with improved aeration of the LEFT lung compared to most recent prior. No significant pneumothorax. IMPRESSION: 1. RIGHT neck CVC tip is somewhat curved in course with the tip terminating over the expected area of the brachiocephalic confluence with the SVC. Recommend correlation with catheter function and placement history. If continued  uncertain anatomic location of the CVC, dedicated CT chest could be performed. 2. Large LEFT pleural effusion with improved aeration of the LEFT lung compared to most recent prior. Electronically Signed   By: Corean Salter M.D.   On: 01/11/2024 11:32   CT HEAD WO CONTRAST Result Date: 01/11/2024 CLINICAL DATA:  48 year old male with altered mental status. Status post CPR. Intubated. EXAM: CT HEAD WITHOUT CONTRAST TECHNIQUE: Contiguous axial images were obtained from the base of the skull through the vertex without intravenous contrast. RADIATION DOSE REDUCTION: This exam was performed according to the departmental dose-optimization program which includes automated exposure control, adjustment of the mA and/or kV according to patient size and/or use of iterative reconstruction technique. COMPARISON:  None Available. FINDINGS: Brain: No prior study for comparison. Normal cerebral volume. No midline shift, mass effect, or evidence of intracranial mass lesion. No ventriculomegaly. No acute intracranial hemorrhage  identified. Questionable loss of bilateral gray-white differentiation (series 2, image 17 and series 3, image 34) although maintained cerebral sulci and no intracranial mass effect or overt cerebral edema. No discrete cortically based infarct. Vascular: No suspicious intracranial vascular hyperdensity. Skull: Intact.  No acute osseous abnormality identified. Sinuses/Orbits: Oral enteric tube and endotracheal tube visible. Visualized paranasal sinuses and mastoids are well aerated. Other: Some generalized scalp and face edema suspected. No discrete orbit or scalp soft tissue injury identified. IMPRESSION: 1. Difficult to exclude anoxic injury; questionable partial loss of gray-white differentiation in both hemispheres on this exam with no prior comparison. But maintained cerebral sulci and no intracranial hemorrhage or mass effect. If decreased neurologic status persists repeat noncontrast Head CT in 24-48 hours or noncontrast Brain MRI recommended. 2. No other acute intracranial abnormality. Electronically Signed   By: VEAR Hurst M.D.   On: 01/11/2024 11:06   DG Chest Port 1 View Addendum Date: 01/11/2024 ADDENDUM REPORT: 01/11/2024 09:45 ADDENDUM: Study discussed by telephone with Dr. RYAN DIXON on 01/11/2024 at 09:44 . Electronically Signed   By: VEAR Hurst M.D.   On: 01/11/2024 09:45   Result Date: 01/11/2024 CLINICAL DATA:  48 year old male status post CPR. EXAM: PORTABLE CHEST 1 VIEW COMPARISON:  Portable chest 12/04/2023. FINDINGS: Portable AP supine view at 0859 hours. Substantially rotated to the left, more so than on the comparison. Endotracheal tube is visible in the trachea, tip at the level the clavicles. An enteric tube is looped over the neck in the midline. Poor ventilation of the left lung. July appearance suggested loculated pleural fluid, might have progressed. Veiling opacity over the right lung also, with moderate size right pleural effusion suspected previously. Poorly visible cardiac and mediastinal  contours. No displaced rib fracture identified. IMPRESSION: 1. Leftward rotated portable chest. 2. Endotracheal tube tip at the level the clavicles. Enteric tube looped at the hypopharynx or proximal esophagus. 3. Poor ventilation of the left lung, possibly progression of left pleural effusion seen last month. Ongoing right lung veiling opacity, also likely related to effusion. Electronically Signed: By: VEAR Hurst M.D. On: 01/11/2024 09:25   DG Abd Portable 1V Addendum Date: 01/11/2024 ADDENDUM REPORT: 01/11/2024 09:44 ADDENDUM: Study discussed by telephone with Dr. RYAN DIXON on 01/11/2024 at 09:44 . Electronically Signed   By: VEAR Hurst M.D.   On: 01/11/2024 09:44   Result Date: 01/11/2024 CLINICAL DATA:  48 year old male status post CPR. EXAM: PORTABLE ABDOMEN - 1 VIEW COMPARISON:  Portable chest today 0859 hours. FINDINGS: 4 portable views of the abdomen 0904 hours. Enteric tube seen looped over the lower neck  on the comparison. Nonobstructed visible bowel gas pattern. Included lung bases suggest large left and moderate right pleural effusions as suspected on the comparison. No acute osseous abnormality identified. IMPRESSION: 1. Enteric tube seen looped over the lower neck on contemporary portable chest. Recommend removal and repositioning. 2. Nonobstructed visible bowel gas pattern. 3. Further evidence of large left and moderate right pleural effusions. Electronically Signed: By: VEAR Hurst M.D. On: 01/11/2024 09:26     Medications:     Scheduled Medications:  aspirin   81 mg Per Tube Daily   Or   aspirin   300 mg Rectal Daily   bumetanide  (BUMEX ) IV  2 mg Intravenous BID   Chlorhexidine  Gluconate Cloth  6 each Topical Daily   docusate  100 mg Per Tube BID   famotidine   20 mg Per Tube BID   heparin  injection (subcutaneous)  5,000 Units Subcutaneous Q8H   insulin  aspart  0-15 Units Subcutaneous Q4H   polyethylene glycol  17 g Per Tube Daily   potassium chloride   40 mEq Per Tube Once    Infusions:   ampicillin -sulbactam (UNASYN ) IV 3 g (01/12/24 0658)   fentaNYL  infusion INTRAVENOUS Stopped (01/11/24 1506)   magnesium  sulfate bolus IVPB     Followed by   magnesium  sulfate bolus IVPB     propofol  (DIPRIVAN ) infusion 15 mcg/kg/min (01/12/24 0548)   valproate sodium  500 mg (01/12/24 0534)    PRN Medications: acetaminophen , docusate, fentaNYL , fentaNYL  (SUBLIMAZE ) injection, fentaNYL  (SUBLIMAZE ) injection, polyethylene glycol    Patient Profile   48 y.o. male with hx superobesity, DM II, PAD s/p bilateral BKA, HFrEF (EF 35-40% range dating back to 2024). Presenting with OOH resuscitated Vfib arrest c/b cardiogenic shock and myoclonus.  Assessment/Plan   OOH Cardiac Arrest with ventricular fibrillation. -Witnessed Vfib arrest 01/11/24. Received 19 minutes ACLS w/ 3 shocks and 3 doses of Epi in the field. -Previous EF of 35 to 40% assumed to be nonischemic but has not had formal ischemic workup. -Echo this admission EF 40%. -Peak high-sensitivity troponin 74.  Suggestive of sudden cardiac death in setting of known cardiomyopathy. - Head CT is suggestive of early anoxic changes in conjunction with physical exam this is quite concerning for anoxic brain injury.  He is currently getting an EEG.  Will consult neurology for further prognostication.  I discussed findings with his wife and told her that I will take some time for things to call to declare themselves - If/when he recovers will need ischemic evaluation. - Continue supportive care for now. - Rhythm currently stable.  Anoxic brain injury - Plan as above.  Acute on chronic systolic heart failure -He is markedly volume overloaded on exam.  Agree with IV Lasix . -Titrate GDMT as tolerated  Acute hypoxic respiratory failure in setting of cardiac arrest. -Vent management per CCM -Covering for aspiration with Unasyn .  PAD -S/p b/l BKA  Diabetes type 2 - Sliding scale insulin  per CCM.  I discussed the situation at length  with his wife at the bedside.  I have told her to notify all important family members.  CCM also present at bedside for multidisciplinary rounds.  Critical care time 55 minutes.  Length of Stay: 1  Toribio Fuel, MD  10:55 AM   Advanced Heart Failure Team Pager [phone] (M-F; 7a - 5p)  Please contact [facility] for night-coverage after hours (5p -7a ) and weekends on [email or web address]

## 2024-01-12 NOTE — Progress Notes (Signed)
 Heart Failure Navigator Progress Note  Assessed for Heart & Vascular TOC clinic readiness.  Patient does not meet criteria due to patient is seen with [facility], Per MD note patient has been mostly bed bound at home. No HF TOC. .   Navigator will sign off at this time.   Stephane Haddock, BSN, Scientist, clinical (histocompatibility and immunogenetics) Only

## 2024-01-12 NOTE — IPAL (Signed)
  Interdisciplinary Goals of Care Family Meeting   Date carried out: 01/12/2024  Location of the meeting: Phone conference  Member's involved: Family Member or next of kin - Father, Lennis Korb over phone, spouse Camrin Gearheart at bedside.  Durable Power of Attorney or acting medical decision maker: Spouse, Retail buyer    Discussion: We discussed goals of care for [employer]. I recapped his history and co-morbidities. We also discussed events leading to his admission and his current presentation with ongoing myoclonus. We reviewed his head CT and LTM findings along with clinical exam findings. I recommended DNR in the event of a recurrent arrest and another 24 hours to assess neuro changes and assess for worsening of myoclonus. If so, I recommended transitioning to comfort measures at that point but also agreed it would be prudent to allow more time for more accurate neuroprognostication.  Father will discuss with mother this AM and get back to us . They are based in [place]  but will likely make their way to [facility] at some point today.  Code status:   Code Status: Full Code   Disposition: Continue current acute care  Time spent for the meeting: 15 minutes.    Sammi Gore, PA-C  01/12/2024, 9:19 AM

## 2024-01-12 NOTE — Procedures (Signed)
 Patient Name: Alan Tapia.  MRN: [id number]  Epilepsy Attending: Arlin MALVA Krebs  Referring Physician/Provider: Jenna Maude BRAVO, NP  Duration: 01/11/2024 1427 to 01/12/2024 1427   Patient history: 48yo M s/p cardiac arrest. EEG to evaluate for seizure   Level of alertness: comatose   AEDs during EEG study: Propofol , VPA   Technical aspects: This EEG study was done with scalp electrodes positioned according to the 10-20 International system of electrode placement. Electrical activity was reviewed with band pass filter of 1-70Hz , sensitivity of 7 uV/mm, display speed of 64mm/sec with a 60Hz  notched filter applied as appropriate. EEG data were recorded continuously and digitally stored.  Video monitoring was available and reviewed as appropriate.   Description: EEG showed burst suppression pattern with highly epileptiform bursts lasting 0.5 to 1 seconds alternating with 3 to 15 seconds of generalized EEG suppression. Gradually as medications were adjusted, the morphology of burst evolved into generalized polymorphic amplitude sharply contoured 3 to 5 Hz theta-delta slowing admixed with generalized spikes lasting 10 to 15 seconds alternating with 15 to 30 seconds of generalized EEG suppression. EEG continued to evolve and gradually showed near continuous generalized 3 to 6 Hz theta-delta slowing admixed with generalized spikes as well as brief 1 to 3 seconds of generalized EEG suppression.  Hyperventilation and photic stimulation were not performed.     Patient was noted to have episodes of brief eye-opening every few seconds.  Concomitant EEG showed generalized polyspikes consistent with myoclonic seizure   ABNORMALITY -Myoclonic seizure, generalized - Burst suppression with highly epileptiform bursts, generalized   IMPRESSION: At the beginning of the study, EEG was suggestive of epileptogenicity with generalized onset.  This EEG pattern was on the ictal-interictal continuum with high risk of  seizure recurrence. Gradually as medications were adjusted, EEG improved and showed evidence of epileptogenicity with generalized onset as well as severe to profound diffuse encephalopathy.    Additionally during the study patient was noted to have episodes of brief eye-opening every few seconds consistent with myoclonic seizures.  Nastasia Kage O Linette Gunderson

## 2024-01-13 ENCOUNTER — Inpatient Hospital Stay (HOSPITAL_COMMUNITY)

## 2024-01-13 ENCOUNTER — Encounter (HOSPITAL_COMMUNITY): Payer: Self-pay | Admitting: Pulmonary Disease

## 2024-01-13 ENCOUNTER — Other Ambulatory Visit (HOSPITAL_COMMUNITY): Payer: Self-pay

## 2024-01-13 ENCOUNTER — Telehealth: Payer: Self-pay | Admitting: Physician Assistant

## 2024-01-13 DIAGNOSIS — I469 Cardiac arrest, cause unspecified: Secondary | ICD-10-CM | POA: Diagnosis not present

## 2024-01-13 DIAGNOSIS — E162 Hypoglycemia, unspecified: Secondary | ICD-10-CM

## 2024-01-13 DIAGNOSIS — R569 Unspecified convulsions: Secondary | ICD-10-CM | POA: Diagnosis not present

## 2024-01-13 LAB — GLUCOSE, CAPILLARY
Glucose-Capillary: 101 mg/dL — ABNORMAL HIGH (ref 70–99)
Glucose-Capillary: 101 mg/dL — ABNORMAL HIGH (ref 70–99)
Glucose-Capillary: 59 mg/dL — ABNORMAL LOW (ref 70–99)
Glucose-Capillary: 66 mg/dL — ABNORMAL LOW (ref 70–99)
Glucose-Capillary: 68 mg/dL — ABNORMAL LOW (ref 70–99)
Glucose-Capillary: 71 mg/dL (ref 70–99)
Glucose-Capillary: 72 mg/dL (ref 70–99)
Glucose-Capillary: 74 mg/dL (ref 70–99)
Glucose-Capillary: 75 mg/dL (ref 70–99)
Glucose-Capillary: 75 mg/dL (ref 70–99)
Glucose-Capillary: 78 mg/dL (ref 70–99)
Glucose-Capillary: 82 mg/dL (ref 70–99)
Glucose-Capillary: 92 mg/dL (ref 70–99)

## 2024-01-13 LAB — BASIC METABOLIC PANEL WITH GFR
Anion gap: 10 (ref 5–15)
BUN: 32 mg/dL — ABNORMAL HIGH (ref 6–20)
CO2: 23 mmol/L (ref 22–32)
Calcium: 7.6 mg/dL — ABNORMAL LOW (ref 8.9–10.3)
Chloride: 107 mmol/L (ref 98–111)
Creatinine, Ser: 2.41 mg/dL — ABNORMAL HIGH (ref 0.61–1.24)
GFR, Estimated: 32 mL/min — ABNORMAL LOW (ref >60)
Glucose, Bld: 112 mg/dL — ABNORMAL HIGH (ref 70–99)
Potassium: 4.4 mmol/L (ref 3.5–5.1)
Sodium: 140 mmol/L (ref 135–145)

## 2024-01-13 LAB — POCT I-STAT 7, (LYTES, BLD GAS, ICA,H+H)
Acid-Base Excess: 1 mmol/L (ref 0.0–2.0)
Bicarbonate: 23.6 mmol/L (ref 20.0–28.0)
Calcium, Ion: 1.14 mmol/L — ABNORMAL LOW (ref 1.15–1.40)
HCT: 31 % — ABNORMAL LOW (ref 39.0–52.0)
Hemoglobin: 10.5 g/dL — ABNORMAL LOW (ref 13.0–17.0)
O2 Saturation: 100 %
Patient temperature: 37.1
Potassium: 4.4 mmol/L (ref 3.5–5.1)
Sodium: 141 mmol/L (ref 135–145)
TCO2: 24 mmol/L (ref 22–32)
pCO2 arterial: 29.8 mmHg — ABNORMAL LOW (ref 32–48)
pH, Arterial: 7.507 — ABNORMAL HIGH (ref 7.35–7.45)
pO2, Arterial: 225 mmHg — ABNORMAL HIGH (ref 83–108)

## 2024-01-13 LAB — CBC
HCT: 34.3 % — ABNORMAL LOW (ref 39.0–52.0)
Hemoglobin: 10.7 g/dL — ABNORMAL LOW (ref 13.0–17.0)
MCH: 25.7 pg — ABNORMAL LOW (ref 26.0–34.0)
MCHC: 31.2 g/dL (ref 30.0–36.0)
MCV: 82.5 fL (ref 80.0–100.0)
Platelets: 344 K/uL (ref 150–400)
RBC: 4.16 MIL/uL — ABNORMAL LOW (ref 4.22–5.81)
RDW: 16.8 % — ABNORMAL HIGH (ref 11.5–15.5)
WBC: 14.6 K/uL — ABNORMAL HIGH (ref 4.0–10.5)
nRBC: 0 % (ref 0.0–0.2)

## 2024-01-13 LAB — MAGNESIUM: Magnesium: 2.5 mg/dL — ABNORMAL HIGH (ref 1.7–2.4)

## 2024-01-13 LAB — COOXEMETRY PANEL
Carboxyhemoglobin: 1.4 % (ref 0.5–1.5)
Methemoglobin: <0.7 % (ref 0.0–1.5)
O2 Saturation: 93.4 %
Total hemoglobin: 11.5 g/dL — ABNORMAL LOW (ref 12.0–16.0)

## 2024-01-13 LAB — PHOSPHORUS: Phosphorus: 4.5 mg/dL (ref 2.5–4.6)

## 2024-01-13 LAB — CULTURE, RESPIRATORY W GRAM STAIN: Culture: NORMAL

## 2024-01-13 LAB — PROCALCITONIN: Procalcitonin: 6.88 ng/mL

## 2024-01-13 MED ORDER — HYDRALAZINE HCL 20 MG/ML IJ SOLN: 10.0000 mg | INTRAMUSCULAR | Status: DC | PRN

## 2024-01-13 MED ORDER — VITAL AF 1.2 CAL PO LIQD
1000.0000 mL | ORAL | Status: DC
  Administered 2024-01-13: 1000 mL

## 2024-01-13 MED ORDER — INSULIN ASPART 100 UNIT/ML IJ SOLN: 0.0000 [IU] | INTRAMUSCULAR | Status: DC

## 2024-01-13 MED ORDER — FAMOTIDINE 20 MG PO TABS
20.0000 mg | ORAL_TABLET | Freq: Every day | ORAL | Status: DC
  Administered 2024-01-13 – 2024-01-14 (×2): 20 mg
  Filled 2024-01-13 (×2): qty 1

## 2024-01-13 MED ORDER — LABETALOL HCL 5 MG/ML IV SOLN
0.0000 mg | INTRAVENOUS | Status: DC | PRN
  Administered 2024-01-13: 10 mg via INTRAVENOUS
  Administered 2024-01-14: 20 mg via INTRAVENOUS
  Filled 2024-01-13 (×2): qty 4

## 2024-01-13 MED ORDER — DEXTROSE 50 % IV SOLN
12.5000 g | INTRAVENOUS | Status: AC
Start: 2024-01-13 — End: 2024-01-13

## 2024-01-13 MED ORDER — DEXTROSE 50 % IV SOLN
12.5000 g | INTRAVENOUS | Status: AC
  Administered 2024-01-13: 12.5 g via INTRAVENOUS
  Filled 2024-01-13: qty 50

## 2024-01-13 MED ORDER — PROSOURCE TF20 ENFIT COMPATIBL EN LIQD
60.0000 mL | Freq: Three times a day (TID) | ENTERAL | Status: DC
  Administered 2024-01-13 – 2024-01-14 (×3): 60 mL
  Filled 2024-01-13 (×3): qty 60

## 2024-01-13 MED ORDER — DEXTROSE 50 % IV SOLN
INTRAVENOUS | Status: AC
  Administered 2024-01-13: 12.5 g via INTRAVENOUS
  Filled 2024-01-13: qty 50

## 2024-01-13 MED ORDER — CALCIUM GLUCONATE-NACL 2-0.675 GM/100ML-% IV SOLN
2.0000 g | Freq: Once | INTRAVENOUS | Status: AC
  Administered 2024-01-13: 2000 mg via INTRAVENOUS
  Filled 2024-01-13: qty 100

## 2024-01-13 MED ORDER — GERHARDT'S BUTT CREAM: TOPICAL_CREAM | CUTANEOUS | Status: DC | PRN

## 2024-01-13 NOTE — TOC Initial Note (Signed)
 Transition of Care (TOC) - Initial/Assessment Note    Patient Details  Name: Alan Tapia. MRN: [id number] Date of Birth: 06-22-1975  Transition of Care [facility]) CM/SW Contact:    Justina Delcia Czar, RN Phone Number: [phone] 01/13/2024, 7:21 AM  Clinical Narrative:                  Spoke to pt's wife at bedside. States pt was bedbound at home prior to hospital stay. Pt has wheelchair, hospital bed and rolling walker. States pt has been to SNF in the past. ICM/CSW will continue to follow for dc needs.   Wife states pt's disability is pending.    Expected Discharge Plan: Skilled Nursing Facility Barriers to Discharge: Continued Medical Work up   Patient Goals and CMS Choice            Expected Discharge Plan and Services   Discharge Planning Services: CM Consult   Living arrangements for the past 2 months: Apartment                                      Prior Living Arrangements/Services Living arrangements for the past 2 months: Apartment Lives with:: Spouse Patient language and need for interpreter reviewed:: Yes Do you feel safe going back to the place where you live?: Yes      Need for Family Participation in Patient Care: Yes (Comment) Care giver support system in place?: Yes (comment) Current home services: DME (specialized hospital bed (adapt), wheelchair, oxygen , RW, prosthetic legs) Criminal Activity/Legal Involvement Pertinent to Current Situation/Hospitalization: No - Comment as needed  Activities of Daily Living      Permission Sought/Granted Permission sought to share information with : Case Manager, PCP, Family Supports Permission granted to share information with : Yes, Verbal Permission Granted  Share Information with NAME: Raja Liska HCPOA  Permission granted to share info w AGENCY: SNF, DME, PCP  Permission granted to share info w Relationship: wife  Permission granted to share info w Contact Information:  [phone]  Emotional Assessment   Attitude/Demeanor/Rapport: Intubated (Following Commands or Not Following Commands)          Admission diagnosis:  Cardiac arrest [facility]) [I46.9] Patient Active Problem List   Diagnosis Date Noted   Cardiac arrest (HCC) 01/11/2024   Shock (HCC) 01/11/2024   AKI (acute kidney injury) (HCC) 01/11/2024   Acute on chronic combined systolic and diastolic CHF (congestive heart failure) (HCC) 12/03/2023   Anasarca 12/03/2023   Proteinuria 12/03/2023   Pleural effusion due to CHF (congestive heart failure) (HCC) 12/03/2023   Problem related to health literacy 12/03/2023   Chronic pain 05/19/2023   Obesity, class 3 05/11/2023   COVID-19 virus infection 05/01/2023   Pressure injury of skin 05/01/2023   Malnutrition of moderate degree 04/14/2023   Subacute osteomyelitis of right foot (HCC) 04/08/2023   Cutaneous abscess of right foot 04/08/2023   S/P BKA (below knee amputation), right [facility]) - 04-08-2023 04/08/2023   Acute osteomyelitis of right foot (HCC) 04/07/2023   Acute on chronic anemia 04/07/2023   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 04/07/2023   Insulin  dependent type 2 diabetes mellitus (HCC) 04/07/2023   CKD (chronic kidney disease), stage II 04/07/2023   Hx of left BKA (HCC) 04/07/2023   PVD (peripheral vascular disease) (HCC) 04/07/2023   Constipation 09/11/2022   Type 2  diabetes mellitus with hyperglycemia, with long-term current use of insulin  (HCC) 09/09/2022   Depressed left ventricular ejection fraction 09/05/2022   Phantom pain 09/02/2022   Adjustment disorder 08/29/2022   Essential hypertension 08/27/2022   Dilated cardiomyopathy (HCC) 08/26/2022   Below-knee amputation of left lower extremity (HCC) 08/22/2022   Sepsis (HCC) 08/15/2022   Uncontrolled type 2 diabetes mellitus with hyperglycemia, with long-term current use of insulin  (HCC) 06/06/2022   PCP:  Patient, No Pcp  Per Pharmacy:   [facility] [phone] - [address] [place], [place] - [address] AT [facility] [address] [name] [address] Phone: [phone] Fax: [phone]  [id number] [place], [name] - [address] AT [facility] [address] [name] [address] Phone: [phone] Fax: [phone]  [id number] - [address] [place] - [address] [facility] [address] [name] [address] Phone: [phone] Fax: [phone]  [facility] [address] [name] [address] Phone: [phone] Fax: [phone]  [facility] [address] [place], [name] - [address] [name] [address] Phone: [phone] Fax: [phone]     Social Drivers of Health (SDOH) Social History: SDOH Screenings   Food Insecurity: No Food Insecurity (01/12/2024)  Housing: Low Risk  (01/12/2024)  Transportation Needs: No Transportation Needs (01/12/2024)  Utilities: At Risk (01/12/2024)  Alcohol  Screen: Low Risk  (08/20/2022)  Financial Resource Strain: Patient Unable To Answer (08/20/2022)  Tobacco Use: Low Risk  (01/11/2024)   SDOH Interventions:     Readmission Risk Interventions    04/07/2023    2:34 PM  Readmission Risk Prevention Plan  Transportation Screening Complete  PCP or Specialist Appt within 3-5 Days Complete  HRI or Home Care Consult Complete  Social Work Consult for Recovery Care Planning/Counseling Complete  Palliative Care Screening Not Applicable  Medication Review [employer]) Complete

## 2024-01-13 NOTE — Progress Notes (Addendum)
   01/13/24 1755  Spiritual Encounters  Type of Visit Initial  Care provided to: Family  Conversation partners present during encounter Nurse  Referral source Family  Reason for visit Urgent spiritual support  OnCall Visit Yes   Responded to family's request for chaplain. Wife, sister and nephew present. Other members on there way from [place] , expected to arrive aprox. 7 PM.  Wife had questions as about the statis of patient. Previously spoke with doctor, however there may have been some confusion and unsettling. Family wanted answers on the phone (while driving). Inquired of nurse who provided information.  Explained to family questions can be addressed when they arrive, no information is being withheld. Seems to be concern about MRI images and/or respirator function. Wife believes she was told that patient was brain dead, however she say patient responded to doctor calling his name and patient opened eyes. Sister limited brain function due to absence of oxygen for over 20 minutes. Chaplain unable to confirm or deny any medical information and advised perhaps family would gain a better understanding when they hear the same thing at the same time in an attempt to de-escalate. Advised chaplain will be available to family.

## 2024-01-13 NOTE — Progress Notes (Signed)
 NAME:  Alan Tapia., MRN:  [id number], DOB:  11-Dec-1975, LOS: 2 ADMISSION DATE:  01/11/2024, CONSULTATION DATE:  9/1 REFERRING MD:  dixon, CHIEF COMPLAINT:  cardiac arrest   History of Present Illness:  48 year old male patient with known history of severe peripheral vascular disease status post bilateral BKA, neuropathy, superobesity, type 2 diabetes, and hypertension, as well as newly diagnosed biventricular heart failure with estimated ejection fraction 35 to 40% with global hypokinesis, this was identified during hospitalization in July 2025, he was ultimately discharged to home on 7/29 after diuresing almost 15 L of fluid with IV bumetanide  (reported allergy to Lasix  with hives) was supposed to be discharged to home on oral bumetanide , losartan , isosorbide , beta-blockade and spironolactone .  Since discharge he has essentially been bedbound.  Unable to get up in wheelchair.  His wife has been doing approximately 2 hours a day of activity including placement of his prosthetics on his lower extremities.  Of note since discharge the patient has not been taking his Bumex , his hydralazine , or his Cozaar  stating to his wife he was allergic to these medications, and she witnessed what she felt was seizure activity following these meds  Presents on 9/1 status post VF arrest.  Per wife patient started to have notable physical accumulation of excess fluid and edema over about the last week's time, on 8/31 began to exhibit worsening shortness of breath and orthopnea, asking his wife to raise the bed up more stating he had fluid building up.  On the a.m. of 9/1 had slid out of bed, wife unsure if also hit his head.  She originally called EMS for lift assist.  But by the time EMS arrived he became pulseless and unresponsive, essentially as they walked in the door.  He was shocked 3 times, had 3 doses of epinephrine , estimated time to return of spontaneous circulation was 19 minutes he was intubated in the  field with a #7 endotracheal tube, initially placed on epinephrine  infusion, and transferred to the emergency room  In the emergency room the team was able to wean the epinephrine  infusion off.  Initially a right deltoid IO was placed.  He was unresponsive, exhibiting a cough reflex, not following commands, and demonstrating persistent bursts of myoclonic type activity in all extremities  A central line was placed by the emergency room physician, changes in the mechanical ventilator were made to optimize minute ventilation, cardiology was consulted, and diagnostic labs sent.  He was admitted to the critical care service  Pertinent  Medical History  Unresponsive with persistent myoclonic type activity.  Currently hemodynamically stable.  Significant Hospital Events: Including procedures, antibiotic start and stop dates in addition to other pertinent events   9/1 admitted postarrest myoclonic activity on arrival estimated time to resuscitation estimated at 19 minutes, initial rhythm VF 9/1 LTM >anoxic injury 9/2 IPAL conversation with pt's spouse and father  9/3 Propofol  up from 20 to 30 due to myoclonus, parents possibly arriving today  Interim History / Subjective:  Unresponsive, myoclonus improved from yesterday but prop up from 20 to 30. Lasix  gtt ongoing at 20, UOP 5.3L. Hypoglycemic overnight despite increase in D10 and D50 pushes.  Objective    Blood pressure (!) 161/82, pulse 91, temperature 99.3 F (37.4 C), resp. rate (!) 28, height 5' 10 (1.778 m), weight (!) 156.5 kg, SpO2 100%. CVP:  [9 mmHg-29 mmHg] 9 mmHg  Vent Mode: PRVC FiO2 (%):  [40 %-60 %] 40 % Set Rate:  [28 bmp] 28 bmp  Vt Set:  [580 mL] 580 mL PEEP:  [10 cmH20] 10 cmH20 Plateau Pressure:  [21 cmH20-25 cmH20] 23 cmH20   Intake/Output Summary (Last 24 hours) at 01/13/2024 0800 Last data filed at 01/13/2024 0700 Gross per 24 hour  Intake 2014.39 ml  Output 5620 ml  Net -3605.61 ml   Filed Weights   01/11/24 0851  01/12/24 0500 01/13/24 0422  Weight: (!) 150 kg (!) 163.4 kg (!) 156.5 kg    Examination: General: Young adult male, critically ill. Neuro: Sedated on 30 Propofol . Not responsive. Myoclonus of face/eyes/neck improved some. HEENT: [place]/AT. Sclerae anicteric. ETT in place. Cardiovascular: RRR, no M/R/G.  Lungs: Respirations shallow and unlabored. Breath sounds diminished. Abdomen: Anasarca. Morbidly obese. BS not appreciated due to body habitus. Musculoskeletal: Chronic edema bilaterally, bilateral BKA's.   Assessment and Plan   VF cardiac arrest in a patient with known biventricular heart failure - 19 minutes of ACLS before ROSC Seen by cardiology Plan Optimize cardiac output Will defer antiarrhythmics to cardiology team Needs ischemia evaluation if he has neurological recovery but unfortunately current signs point to severe anoxia  Acute on chronic biventricular heart failure with pulmonary edema, and history of hypertension - Almost certainly exacerbated by his medical noncompliance.  EF estimated at 35% during last hospitalization Plan AHF continuing aggressive diuresis with continuous lasix  His noncompliance, seemingly driven by allergies and what his wife says are seizure.  Difficult to know if these have been true seizures or not  Acute hypoxic respiratory failure status postcardiac arrest in the setting of pulmonary edema Suspect he has untreated OHS as well as OSA likely contributing to the RV component of his cardiac dysfunction  plan Full ventilator support Mental status precludes weaning Continue empiric CTX Follow cultures Volume reduction with diuresis CXR intermittently  Acute metabolic encephalopathy status postcardiac arrest with CT already showing anoxic injury and LTM with myoclonus/anoxia  Plan Continue LTM Continue empiric Valproate Ongoing goals of care discussions, see below Neuroprotective measures to include: Head of bed elevated, avoiding fevers,  keep euglycemic Will get MRI brain today to help with neuroprognostication  Stage II CKD, with acute on chronic renal failure Baseline serum creatinine around 1.4 now 1.98 suspect cardiorenal syndrome driving this Plan Aggressive diuresis as above per AHF, caution with renal function Follow BMP  Peripheral vascular disease with bilateral BKA Plan Ensure pressure relief measures Diuresis Aspirin   Insulin -dependent diabetes type 2 Plan Sliding scale insulin  Goal glucose 140-180  Obesity class III Plan Needs weight loss RD consult  Medical noncompliance Plan Will need to identify ideal discharge medication regimen Not clear to me that the seizures that he has been exhibiting are medication related, not even sure they were seizures  Hypoglycemia Plan Continue D10 Start TF's, will get Cortrak today  Goals of care Plan Given CT and LTM findings along with clinical exam, recommended DNR and consideration of compassionate extubation if no improvement in myoclonus over next 24 hours. Suspect this will worsen but needs time for accurate neuro prognostication. I called pt's father and discussed with spouse at bedside 9/2, see IPAL note. Will get MRI brain today to help guide further conversations.  Critical care time: 35 minutes    Sammi Gore, [place] [facility] For pager details, please see AMION or use Epic chat  After 1900, please call [facility] for cross coverage needs 01/13/2024, 8:00 AM

## 2024-01-13 NOTE — Progress Notes (Signed)
 LTM VIDEO EEG discontinued - no skin breakdown at [facility].

## 2024-01-13 NOTE — Progress Notes (Signed)
 Hypoglycemic Event  CBG: 66  Treatment: D50 25 mL (12.5 gm) @ 1559  Symptoms: None  Follow-up CBG: Time:1634 CBG Result:72  Possible Reasons for Event: Inadequate meal intake and Unknown  Comments/MD notified: Dr. Harold, Valinda Carwin Elona Yinger

## 2024-01-13 NOTE — Progress Notes (Signed)
 Ventilator patient transported from 2H02 to MRI without any complications

## 2024-01-13 NOTE — Progress Notes (Signed)
 Initial Nutrition Assessment  DOCUMENTATION CODES:   Morbid obesity  INTERVENTION:   Tube Feeding via Cortrak: Vital AF 1.2 at 55 ml/hr Begin TF at rate of 25 ml/hr, titrate by 10 mL q 4 hours until goal rate of 55 ml/hr Pro-source TF20 60 mL TID TF provides 1824 kcals, 159 g of protein and 1069 mL of free water  Recommend continuing D10 infusion once TF initiated until CBGs in acceptable range  NUTRITION DIAGNOSIS:   Inadequate oral intake related to acute illness as evidenced by NPO status.  GOAL:   Patient will meet greater than or equal to 90% of their needs   MONITOR:   TF tolerance, Vent status, Labs, Weight trends  REASON FOR ASSESSMENT:   Consult Enteral/tube feeding initiation and management  ASSESSMENT:   48 yo male admitted post OOH Vfib arrest requiring intubation, status myoclonus post arrest, concern for anoxic brain injury. PMH includes DM, morbid obesity, PAD s/p bilateral BKA, Biventricular HF  9/01 Admitted  Pt remains on vent support, MRI brain today TTM. +myoclonus, propofol  increased over night. Noted concern for severe anoxic brain injury, GOC discussions ongoing with family coming in from out of town today  Cortrak placed today, plan for TF  D10 at 50 ml/hr started yesterday for hypoglycemia. Noted pt has been hypoglycemic despite dextrose  infusion, required multiple amps D50  Labs: Creatinine 2.41 BUN 32 Sodium 140 (wdl) Potassium 4.4 (wdl) Phosphorus 4.5 (wdl) Magnesium  2.5 (wdl) CBGs 59-101  Meds: Colace Pepcid  SS novolog  Miralx Lasix   Diet Order:   Diet Order             Diet NPO time specified  Diet effective now                   EDUCATION NEEDS:   Not appropriate for education at this time  Skin:  Skin Assessment: Skin Integrity Issues: Skin Integrity Issues:: Other (Comment) Other: traumatic wound to sternum  Last BM:  9/3  Height:   Ht Readings from Last 1 Encounters:  01/13/24 5' 10 (1.778 m)     Weight:   Wt Readings from Last 1 Encounters:  01/13/24 (!) 156.5 kg     BMI:  Body mass index is 49.51 kg/m.  Estimated Nutritional Needs:   Kcal:  1700-1900 kcals  Protein:  150-170 g  Fluid:  1.8 L   Betsey Finger MS, RDN, LDN, CNSC Registered Dietitian 3 Clinical Nutrition RD Inpatient Contact Info in Amion

## 2024-01-13 NOTE — Progress Notes (Addendum)
 Advanced Heart Failure Rounding Note  Cardiologist: None  Chief Complaint: Out of hospital cardiac arrest  Subjective:    CVP 10. Excellent diuresis last 24 hrs with lasix  gtt at 20/hr + 2.5 mg metolazone .   Propofol  increased from 20 to 30 overnight d/t myoclonic jerking.  Hypoglycemic overnight, dextrose  infusion increased and required multiple amps of D50.  Wife at bedside.   Objective:   Weight Range: (!) 156.5 kg Body mass index is 49.51 kg/m.   Vital Signs:   Temp:  [98.4 F (36.9 C)-99.1 F (37.3 C)] 99.1 F (37.3 C) (09/03 0547) Pulse Rate:  [83-95] 93 (09/03 0547) Resp:  [0-32] 0 (09/03 0547) BP: (140-180)/(64-99) 164/90 (09/03 0547) SpO2:  [95 %-100 %] 100 % (09/03 0547) FiO2 (%):  [60 %] 60 % (09/03 0330) Weight:  [156.5 kg] 156.5 kg (09/03 0422) Last BM Date : 01/12/24  Weight change: Filed Weights   01/11/24 0851 01/12/24 0500 01/13/24 0422  Weight: (!) 150 kg (!) 163.4 kg (!) 156.5 kg    Intake/Output:   Intake/Output Summary (Last 24 hours) at 01/13/2024 0702 Last data filed at 01/13/2024 0600 Gross per 24 hour  Intake 2000.84 ml  Output 6160 ml  Net -4159.16 ml      Physical Exam    General:  Critically ill appearing HEENT: + ETT Cor: Regular rate & rhythm. No murmurs. Lungs: breathing nonlabored on vent Abdomen: obese, soft, + edema lower abdomen Extremities: B/l BKA, 2-3 + edema in thighs Neuro: Sedated on propofol    Telemetry   SR 80s-90s   Labs    CBC Recent Labs    01/12/24 0350 01/13/24 0430 01/13/24 0437  WBC 14.2* 14.6*  --   HGB 8.4* 10.7* 10.5*  HCT 27.6* 34.3* 31.0*  MCV 85.7 82.5  --   PLT 274 344  --    Basic Metabolic Panel Recent Labs    90/97/74 0350 01/12/24 2010 01/13/24 0430 01/13/24 0437  NA 144  --  140 141  K 3.5  --  4.4 4.4  CL 117*  --  107  --   CO2 18*  --  23  --   GLUCOSE 60*  --  112*  --   BUN 22*  --  32*  --   CREATININE 1.70*  --  2.41*  --   CALCIUM  6.1*  --  7.6*  --    MG 1.3* 2.6* 2.5*  --   PHOS 2.6  --  4.5  --    Liver Function Tests Recent Labs    01/11/24 0851  AST 37  ALT 16  ALKPHOS 141*  BILITOT 0.5  PROT 5.9*  ALBUMIN  1.7*   No results for input(s): LIPASE, AMYLASE in the last 72 hours. Cardiac Enzymes Recent Labs    01/11/24 1145  CKTOTAL 409*  CKMB 12.2*    BNP: BNP (last 3 results) Recent Labs    12/03/23 2010 01/11/24 0854 01/12/24 0350  BNP 898.0* 605.9* 539.7*    ProBNP (last 3 results) No results for input(s): PROBNP in the last 8760 hours.   D-Dimer No results for input(s): DDIMER in the last 72 hours. Hemoglobin A1C No results for input(s): HGBA1C in the last 72 hours. Fasting Lipid Panel Recent Labs    01/12/24 0350  TRIG 111   Thyroid Function Tests No results for input(s): TSH, T4TOTAL, T3FREE, THYROIDAB in the last 72 hours.  Invalid input(s): FREET3  Other results:   Imaging    Overnight  EEG with video Result Date: 01/12/2024 Alan Arlin KIDD, MD     01/12/2024  9:10 AM Patient Name: Alan Tapia. MRN: [id number] Epilepsy Attending: Arlin Tapia Alan Referring Physician/Provider: Jenna Maude BRAVO, NP Duration: 01/11/2024 1427 to 01/12/2024 0900  Patient history: 48yo M s/p cardiac arrest. EEG to evaluate for seizure  Level of alertness: comatose  AEDs during EEG study: Propofol , VPA  Technical aspects: This EEG study was done with scalp electrodes positioned according to the 10-20 International system of electrode placement. Electrical activity was reviewed with band pass filter of 1-70Hz , sensitivity of 7 uV/mm, display speed of 26mm/sec with a 60Hz  notched filter applied as appropriate. EEG data were recorded continuously and digitally stored.  Video monitoring was available and reviewed as appropriate.  Description: EEG showed burst suppression pattern with highly epileptiform bursts lasting 0.5 to 1 seconds alternating with 3 to 15 seconds of generalized EEG suppression.  Gradually as medications were adjusted, the morphology of burst evolved into generalized polymorphic amplitude sharply contoured 3 to 5 Hz theta-delta slowing admixed with generalized spikes lasting 10 to 15 seconds alternating with 15 to 30 seconds of generalized EEG suppression. EEG continued to evolve and gradually showed near continuous generalized 3 to 6 Hz theta-delta slowing admixed with generalized spikes as well as brief 1 to 3 seconds of generalized EEG suppression.  Hyperventilation and photic stimulation were not performed.   Patient was noted to have episodes of brief eye-opening every few seconds.  Concomitant EEG showed generalized polyspikes consistent with myoclonic seizure  ABNORMALITY -Myoclonic seizure, generalized - Burst suppression with highly epileptiform bursts, generalized  IMPRESSION: At the beginning of the study, EEG was suggestive of epileptogenicity with generalized onset.  This EEG pattern was on the ictal-interictal continuum with high risk of seizure recurrence. Gradually as medications were adjusted, EEG improved and showed evidence of epileptogenicity with generalized onset as well as severe to profound diffuse encephalopathy.  Additionally during the study patient was noted to have episodes of brief eye-opening every few seconds consistent with myoclonic seizures. Alan Tapia     Medications:     Scheduled Medications:  aspirin   81 mg Per Tube Daily   Or   aspirin   300 mg Rectal Daily   Chlorhexidine  Gluconate Cloth  6 each Topical Daily   docusate  100 mg Per Tube BID   famotidine   20 mg Per Tube Daily   heparin  injection (subcutaneous)  5,000 Units Subcutaneous Q8H   insulin  aspart  0-15 Units Subcutaneous Q4H   polyethylene glycol  17 g Per Tube Daily    Infusions:  cefTRIAXone  (ROCEPHIN )  IV Stopped (01/12/24 1521)   dextrose  50 mL/hr at 01/13/24 0600   furosemide  (LASIX ) 200 mg in dextrose  5 % 100 mL (2 mg/mL) infusion 20 mg/hr (01/13/24 0600)    propofol  (DIPRIVAN ) infusion 30 mcg/kg/min (01/13/24 0600)   valproate sodium  500 mg (01/13/24 0619)    PRN Medications: acetaminophen , docusate, fentaNYL , fentaNYL  (SUBLIMAZE ) injection, fentaNYL  (SUBLIMAZE ) injection, polyethylene glycol    Patient Profile   48 y.o. male with hx superobesity, DM II, PAD s/p bilateral BKA, HFrEF (EF 35-40% range dating back to 2024). Presenting with OOH resuscitated Vfib arrest c/b cardiogenic shock and myoclonus.  Assessment/Plan   OOH Cardiac Arrest with ventricular fibrillation. -Witnessed Vfib arrest 01/11/24. Received 19 minutes ACLS w/ 3 shocks and 3 doses of Epi in the field. -Previous EF of 35 to 40% assumed to be nonischemic but has not had formal ischemic workup. -Echo this  admission EF 40%. -Peak high-sensitivity troponin 74.  Suggestive of sudden cardiac death in setting of known cardiomyopathy. - Head CT is suggestive of early anoxic changes. EEG with evidence of myoclonic seizures. Propofol  increased overnight for myoclonus. These findings in conjunction with physical exam are quite concerning for anoxic brain injury.  MRI brain today for further neuroprognostication - If/when he recovers will need ischemic evaluation. - No recurrent rhythm issues  Anoxic brain injury - Plan as above.  Acute on chronic systolic heart failure -Brisk diuresis with lasix  gtt and metolazone . CVP down 10. Still has significant volume on board. Will continue lasix  gtt at 20/hr. Hold off on metolazone  today. Scr worse, 1.7>2.4 but may also be element of ATN from hypotension on initial presentation.Repeat BMET this afternoon. -Titrate GDMT as tolerated - currently allowing permissive hypertension d/t concern for anoxic brain injury  Acute hypoxic respiratory failure in setting of cardiac arrest. -Vent management per CCM -Covering for aspiration with ceftriaxone .  PAD -S/p b/l BKA  Diabetes type 2 - Now hypoglycemic - On dextrose  infusion  GOC -  Remains full code.  - Changing to DNR status had been discussed with family on 09/02. Some family  is based in [place]  and will be arriving today. They are planning to discuss and get back to us .  CRITICAL CARE Performed by: Alan Tapia   Total critical care time: 20 minutes  Critical care time was exclusive of separately billable procedures and treating other patients.  Critical care was necessary to treat or prevent imminent or life-threatening deterioration.  Critical care was time spent personally by me on the following activities: development of treatment plan with patient and/or surrogate as well as nursing, discussions with consultants, evaluation of patient's response to treatment, examination of patient, obtaining history from patient or surrogate, ordering and performing treatments and interventions, ordering and review of laboratory studies, ordering and review of radiographic studies, pulse oximetry and re-evaluation of patient's condition.   Length of Stay: 2  Alan Tapia, Alan N, PA-C  7:02 AM   Advanced Heart Failure Team Pager [phone] (M-F; 7a - 5p)  Please contact [facility] for night-coverage after hours (5p -7a ) and weekends on [email or web address]  Agree with above.  Remains on the vent.  He is unresponsive.  Having more myoclonic activity overnight and his propofol  was increased.  Continues to diurese briskly with IV Lasix .  He is out over 5 L.  EEG suggestive of diffuse anoxic injury.  Serum creatinine is up slightly today.  His rhythm is stable.  He has had severe hypoglycemia overnight now was on a dextrose  infusion.  General: Unresponsive on ventilator. HEENT: Endotracheal tube present. Neck: supple.  Central line present.  BP elevated.. Cor: PMI nondisplaced. Regular rate & rhythm. No rubs, gallops or murmurs. Lungs: clear Abdomen: soft, nontender, nondistended. No hepatosplenomegaly. No bruits or masses. Good bowel sounds. Extremities: no cyanosis,  clubbing, rash, 1+edema status post bilateral BKA. Neuro: Unresponsive on ventilator.  He remains critically ill.  Events points to severe anoxic brain injury status post cardiac arrest.  Mains on propofol  for suppression of myoclonic activity.  Discussed this with CCM team at the bedside.  Will plan brain MRI today for further prognostication.  I spoke with his wife this morning.  Parents will be coming to town later today.  I suspect we will need to discuss withdrawal of care due to severe anoxic brain injury.  Await results of MRI.  Will continue diuresis and supportive care for now.  Critical care time 45 minutes.  Toribio Fuel, MD  10:28 AM

## 2024-01-13 NOTE — Progress Notes (Signed)
 PCCM Brief Note  Hypertension with last BP 184/95.   Will add PRN Labetalol  and PRN Hydralazine  for now, goal SBP < 170. Depending on frequency may need to add drip but doubt so.   Sammi Gore, PA - [facility] For pager details, please see AMION or use Epic chat  After 1900, please call [facility] for cross coverage needs 01/13/2024, 1:03 PM

## 2024-01-13 NOTE — Procedures (Signed)
 Patient Name: Alan Tapia.  MRN: [id number]  Epilepsy Attending: Arlin MALVA Krebs  Referring Physician/Provider: Jenna Maude BRAVO, NP  Duration: 01/12/2024 1427 to 01/13/2024 1343   Patient history: 48yo M s/p cardiac arrest. EEG to evaluate for seizure   Level of alertness: comatose   AEDs during EEG study: Propofol , VPA   Technical aspects: This EEG study was done with scalp electrodes positioned according to the 10-20 International system of electrode placement. Electrical activity was reviewed with band pass filter of 1-70Hz , sensitivity of 7 uV/mm, display speed of 38mm/sec with a 60Hz  notched filter applied as appropriate. EEG data were recorded continuously and digitally stored.  Video monitoring was available and reviewed as appropriate.   Description: EEG initially showed continuous generalized 3 to 6 Hz theta-delta slowing. Gradually EEG evolved into continuous generalized background attenuation. Hyperventilation and photic stimulation were not performed.     Event button was pressed on 01/13/2024 at 0815 and 1231.  Per nurse, patient was jerking which was difficult to see on video.  Concomitant EEG before, during and after the event did not show any EEG change to suggest seizure.  ABNORMALITY - Continuous slow, generalized - Background attenuation, generalized   IMPRESSION: At the beginning of the study, EEG was suggestive of severe diffuse encephalopathy. Gradually EEG worsened and was suggestive of profound diffuse encephalopathy.No seizures were noted.  Event was recorded on 01/13/2024 at 0815 and 1213 for jerking without concomitant EEG change. This was most likely not an epileptic event. Subcortical myoclonus can have similar appearance    Aerilynn Goin MALVA Krebs

## 2024-01-13 NOTE — Procedures (Signed)
 Cortrak  Person Inserting Tube:  Rosabel Rollo PARAS, RD Tube Type:  Cortrak - 43 inches Tube Size:  10 Tube Location:  Left nare Secured by: Bridle Initial Placement:  Gastric Technique Used to Measure Tube Placement:  Marking at nare/corner of mouth Cortrak Secured At:  74 cm Initial Placement Verification:  Cortrak device (Registered Dieticians Only)  Cortrak Tube Team Note:  Consult received to place a Cortrak feeding tube.   No x-ray is required. RN may begin using tube.   If the tube becomes dislodged please keep the tube and contact the Cortrak team at [email or web address] for replacement.  If after hours and replacement cannot be delayed, place a NG tube and confirm placement with an abdominal x-ray.    Mallie Rosabel, MS, RD, LDN Registered Dietitian II Please see AMiON for contact information.

## 2024-01-14 DIAGNOSIS — E876 Hypokalemia: Secondary | ICD-10-CM

## 2024-01-14 DIAGNOSIS — I469 Cardiac arrest, cause unspecified: Secondary | ICD-10-CM | POA: Diagnosis not present

## 2024-01-14 DIAGNOSIS — D649 Anemia, unspecified: Secondary | ICD-10-CM

## 2024-01-14 DIAGNOSIS — J90 Pleural effusion, not elsewhere classified: Secondary | ICD-10-CM

## 2024-01-14 LAB — CBC
HCT: 28 % — ABNORMAL LOW (ref 39.0–52.0)
Hemoglobin: 8.8 g/dL — ABNORMAL LOW (ref 13.0–17.0)
MCH: 26 pg (ref 26.0–34.0)
MCHC: 31.4 g/dL (ref 30.0–36.0)
MCV: 82.6 fL (ref 80.0–100.0)
Platelets: 274 K/uL (ref 150–400)
RBC: 3.39 MIL/uL — ABNORMAL LOW (ref 4.22–5.81)
RDW: 16.4 % — ABNORMAL HIGH (ref 11.5–15.5)
WBC: 8.2 K/uL (ref 4.0–10.5)
nRBC: 0 % (ref 0.0–0.2)

## 2024-01-14 LAB — PHOSPHORUS: Phosphorus: 5 mg/dL — ABNORMAL HIGH (ref 2.5–4.6)

## 2024-01-14 LAB — BASIC METABOLIC PANEL WITH GFR
Anion gap: 13 (ref 5–15)
BUN: 36 mg/dL — ABNORMAL HIGH (ref 6–20)
CO2: 21 mmol/L — ABNORMAL LOW (ref 22–32)
Calcium: 7.4 mg/dL — ABNORMAL LOW (ref 8.9–10.3)
Chloride: 107 mmol/L (ref 98–111)
Creatinine, Ser: 2.62 mg/dL — ABNORMAL HIGH (ref 0.61–1.24)
GFR, Estimated: 29 mL/min — ABNORMAL LOW (ref >60)
Glucose, Bld: 127 mg/dL — ABNORMAL HIGH (ref 70–99)
Potassium: 3.4 mmol/L — ABNORMAL LOW (ref 3.5–5.1)
Sodium: 141 mmol/L (ref 135–145)

## 2024-01-14 LAB — GLUCOSE, CAPILLARY
Glucose-Capillary: 90 mg/dL (ref 70–99)
Glucose-Capillary: 109 mg/dL — ABNORMAL HIGH (ref 70–99)
Glucose-Capillary: 119 mg/dL — ABNORMAL HIGH (ref 70–99)

## 2024-01-14 MED ORDER — POTASSIUM CHLORIDE 20 MEQ PO PACK
40.0000 meq | PACK | Freq: Once | ORAL | Status: AC
  Administered 2024-01-14: 40 meq
  Filled 2024-01-14: qty 2

## 2024-01-14 MED ORDER — HYDRALAZINE HCL 50 MG PO TABS
50.0000 mg | ORAL_TABLET | Freq: Three times a day (TID) | ORAL | Status: DC
  Administered 2024-01-14: 50 mg
  Filled 2024-01-14: qty 1

## 2024-01-14 MED ORDER — SODIUM CHLORIDE 0.9 % IV SOLN: INTRAVENOUS | Status: DC

## 2024-01-14 MED ORDER — GLYCOPYRROLATE 1 MG PO TABS: 1.0000 mg | ORAL_TABLET | ORAL | Status: DC | PRN

## 2024-01-14 MED ORDER — ACETAMINOPHEN 650 MG RE SUPP: 650.0000 mg | Freq: Four times a day (QID) | RECTAL | Status: DC | PRN

## 2024-01-14 MED ORDER — MORPHINE BOLUS VIA INFUSION
5.0000 mg | INTRAVENOUS | Status: DC | PRN
  Administered 2024-01-14: 5 mg via INTRAVENOUS

## 2024-01-14 MED ORDER — GLYCOPYRROLATE 0.2 MG/ML IJ SOLN
0.2000 mg | INTRAMUSCULAR | Status: DC | PRN
Start: 2024-01-14 — End: 2024-01-15
  Filled 2024-01-14: qty 1

## 2024-01-14 MED ORDER — MUPIROCIN 2 % EX OINT
1.0000 | TOPICAL_OINTMENT | Freq: Two times a day (BID) | CUTANEOUS | Status: DC
  Administered 2024-01-14: 1 via NASAL
  Filled 2024-01-14: qty 22

## 2024-01-14 MED ORDER — POLYVINYL ALCOHOL 1.4 % OP SOLN: 1.0000 [drp] | Freq: Four times a day (QID) | OPHTHALMIC | Status: DC | PRN

## 2024-01-14 MED ORDER — ACETAMINOPHEN 325 MG PO TABS: 650.0000 mg | ORAL_TABLET | Freq: Four times a day (QID) | ORAL | Status: DC | PRN

## 2024-01-14 MED ORDER — GLYCOPYRROLATE 0.2 MG/ML IJ SOLN
0.2000 mg | INTRAMUSCULAR | Status: DC | PRN
Start: 2024-01-14 — End: 2024-01-15

## 2024-01-14 MED ORDER — MIDAZOLAM BOLUS VIA INFUSION (WITHDRAWAL LIFE SUSTAINING TX)
2.0000 mg | INTRAVENOUS | Status: DC | PRN
  Administered 2024-01-14: 2 mg via INTRAVENOUS

## 2024-01-14 MED ORDER — MORPHINE 100MG IN NS 100ML (1MG/ML) PREMIX INFUSION
0.0000 mg/h | INTRAVENOUS | Status: DC
  Administered 2024-01-14: 5 mg/h via INTRAVENOUS
  Filled 2024-01-14: qty 100

## 2024-01-14 MED ORDER — MIDAZOLAM HCL 2 MG/2ML IJ SOLN: 1.0000 mg | INTRAMUSCULAR | Status: DC | PRN

## 2024-01-14 MED ORDER — MIDAZOLAM-SODIUM CHLORIDE 100-0.9 MG/100ML-% IV SOLN
0.0000 mg/h | INTRAVENOUS | Status: DC
  Administered 2024-01-14: 1 mg/h via INTRAVENOUS
  Filled 2024-01-14: qty 100

## 2024-01-16 LAB — CULTURE, BLOOD (ROUTINE X 2)
Culture: NO GROWTH
Culture: NO GROWTH
Special Requests: ADEQUATE

## 2024-02-10 NOTE — Death Summary Note (Signed)
 DEATH SUMMARY   Patient Details  Name: Alan Tapia. 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 with pulmonary edema Acute respiratory failure with hypoxia and hypercapnia Diabetes type 2 Peripheral arterial disease status post bilateral BKA Obesity DNR status  Diagnoses  Preliminary cause of death: Withdrawal of care in the setting of severe anoxic brain injury post V-fib cardiac arrest Secondary Diagnoses (including complications and co-morbidities):  Principal Problem:   Cardiac arrest [facility]) Active Problems:   Acute on chronic combined systolic and diastolic CHF (congestive heart failure) (HCC)   Shock (HCC)   AKI (acute kidney injury) [facility])   Brief Hospital Course (including significant findings, care, treatment, and services provided and events leading to death)  Alan Tapia. is a 48 y.o. year old male with known history of severe peripheral vascular disease status post bilateral BKA, neuropathy, superobesity, type 2 diabetes, and hypertension, as well as newly diagnosed biventricular heart failure with estimated ejection fraction 35 to 40% with global hypokinesis, this was identified during hospitalization in July 2025, he was ultimately discharged to home on 7/29 after diuresing almost 15 L of fluid with IV bumetanide  (reported allergy to Lasix  with hives) was supposed to be discharged to home on oral bumetanide , losartan , isosorbide , beta-blockade and spironolactone .    Since discharge he has essentially been bedbound.  Unable to get up in wheelchair.  His wife has been doing approximately 2 hours a day of activity including placement of his prosthetics on his lower extremities.  Of note since discharge the patient has not been taking his Bumex , his hydralazine , or his Cozaar  stating to his wife he was allergic to these medications, and she witnessed what she felt was seizure activity following these meds   Presents on 01/18/24 status post VF arrest.  Per wife patient started to have notable physical accumulation of excess fluid and edema over about the last week's time, on 8/31 began to exhibit worsening shortness of breath and orthopnea, asking his wife to raise the bed up more stating he had fluid building up.  On the a.m. of January 18, 2024 had slid out of bed, wife unsure if also hit his head.  She originally called EMS for lift assist.  But by the time EMS arrived he became pulseless and unresponsive, essentially as they walked in the door.  He was shocked 3 times, had 3 doses of epinephrine , estimated time to return of spontaneous circulation was 19 minutes he was intubated in the field with a #7 endotracheal tube, initially placed on epinephrine  infusion, and transferred to the emergency room   In the emergency room the team was able to wean the epinephrine  infusion off.  Initially a right deltoid IO was placed.  He was unresponsive, exhibiting a cough reflex, not following commands, and demonstrating persistent bursts of myoclonic type activity in all extremities  18-Jan-2024 admitted postarrest myoclonic activity on arrival estimated time to resuscitation estimated at 19 minutes, initial rhythm VF January 18, 2024 LTM >anoxic injury 9/2 IPAL conversation with pt's spouse and father  9/3 Propofol  up from 20 to 30 due to myoclonus,  parents possibly arriving today. MRI w anoxic brain injury   Goals of care discussions were carried with family, patient was family decided to proceed with comfort  care and palliative extubation.  Comfort focused orders were placed, patient was palliatively extubated in the past on 17-Jan-2024 at 3:30 PM.  Patient's family was at bedside     Pertinent Labs and Studies  Significant Diagnostic Studies MR BRAIN WO CONTRAST Result Date: 01/13/2024 CLINICAL DATA:  Provided history: Anoxic brain damage. EXAM: MRI HEAD WITHOUT CONTRAST TECHNIQUE: Multiplanar, multiecho pulse sequences of the brain and surrounding structures were obtained without intravenous contrast. COMPARISON:  Head CT 01/11/2024. FINDINGS: Brain: Cerebral volume is normal. Extensive diffusion-weighted and T2 hyperintense signal abnormality throughout much of the bilateral cerebral cortex and within the bilateral deep gray nuclei. Subtle diffusion-weighted signal abnormality is also suspected within the cerebellum. These findings are consistent with acute hypoxic/ischemic injury. No significant effacement of the basal cisterns at this time. No evidence of brain herniation. Small T2 hyperintense focus within the superior right cerebellar hemisphere, likely reflecting a prominent perivascular space. No cortical encephalomalacia is identified. No evidence of an intracranial mass. No chronic intracranial blood products. No extra-axial fluid collection. No midline shift. Vascular: Maintained flow voids within the proximal large arterial vessels. Skull and upper cervical spine: No focal worrisome marrow lesion. Sinuses/Orbits: No mass or acute finding within the imaged orbits. Mild mucosal thickening within bilateral ethmoid air cells. Small fluid levels, and mild background mucosal thickening, within the bilateral sphenoid sinuses. Other: Small-volume fluid within bilateral mastoid air cells. These results will be called to the ordering clinician or representative by the Radiologist Assistant, and communication documented in the PACS or [employer]. IMPRESSION: Extensive signal abnormality throughout much of  the bilateral cerebral cortex and within the bilateral deep gray nuclei. Subtle diffusion-weighted signal abnormality is also suspected within the cerebellum. These findings are consistent with acute hypoxic/ischemic injury. No significant effacement of the basal cisterns at this time. No evidence of brain herniation. Electronically Signed   By: Rockey Childs D.O.   On: 01/13/2024 15:22   Overnight EEG with video Result Date: 01/12/2024 Shelton Arlin KIDD, MD     01/13/2024  8:31 AM Patient Name: Alan Tapia. MRN: [id number] Epilepsy Attending: Arlin KIDD Shelton Referring Physician/Provider: Jenna Maude BRAVO, NP Duration: 01/11/2024 1427 to 01/12/2024 1427  Patient history: 48yo M s/p cardiac arrest. EEG to evaluate for seizure  Level of alertness: comatose  AEDs during EEG study: Propofol , VPA  Technical aspects: This EEG study was done with scalp electrodes positioned according to the 10-20 International system of electrode placement. Electrical activity was reviewed with band pass filter of 1-70Hz , sensitivity of 7 uV/mm, display speed of 106mm/sec with a 60Hz  notched filter applied as appropriate. EEG data were recorded continuously and digitally stored.  Video monitoring was available and reviewed as appropriate.  Description: EEG showed burst suppression pattern with highly epileptiform bursts lasting 0.5 to 1 seconds alternating with 3 to 15 seconds of generalized EEG suppression. Gradually as medications were adjusted, the morphology of burst evolved into generalized polymorphic amplitude sharply contoured 3 to 5 Hz theta-delta slowing admixed with generalized spikes lasting 10 to 15 seconds alternating with 15 to 30 seconds of generalized EEG suppression. EEG continued to evolve and gradually showed near continuous generalized 3 to 6 Hz theta-delta slowing admixed with generalized spikes as well as brief 1 to 3 seconds of generalized EEG suppression.  Hyperventilation and photic stimulation were not performed.  Patient was noted to have episodes of brief eye-opening every few seconds.  Concomitant EEG showed generalized polyspikes consistent with myoclonic seizure  ABNORMALITY -Myoclonic seizure, generalized - Burst suppression with highly epileptiform bursts, generalized  IMPRESSION: At the beginning of the study, EEG was suggestive of epileptogenicity with generalized onset.  This EEG pattern was on the ictal-interictal continuum with high risk of seizure recurrence. Gradually as medications were adjusted, EEG improved and showed evidence of epileptogenicity with generalized onset as well as severe to profound diffuse encephalopathy.  Additionally during the study patient was noted to have episodes of brief eye-opening every few seconds consistent with myoclonic seizures. Arlin MALVA Krebs   DG Chest Port 1 View Result Date: 01/12/2024 EXAM: 1 VIEW XRAY OF THE CHEST 01/12/2024 05:29:00 AM COMPARISON: Radiograph of the chest dated 01/11/2024. CLINICAL HISTORY: Respiratory failure. FINDINGS: LUNGS AND PLEURA: Persistent diffuse hazy opacification of the lower lung zones, worse on the left. Left lateral pleural effusion/pleural thickening, as before. HEART AND MEDIASTINUM: No acute abnormality of the cardiac and mediastinal silhouettes. BONES AND SOFT TISSUES: No acute osseous abnormality. LINES AND TUBES: Endotracheal tube and right internal jugular central venous line remain in place. IMPRESSION: 1. Persistent diffuse hazy opacification of the lower lung zones, worse on the left. 2. Left lateral pleural effusion/pleural thickening, as before. Electronically signed by: Evalene Coho MD 01/12/2024 05:59 AM EDT RP Workstation: [id number]   DG CHEST PORT 1 VIEW Result Date: 01/11/2024 CLINICAL DATA:  Respiratory failure EXAM: PORTABLE CHEST 1 VIEW COMPARISON:  01/11/2024, 11:08 a.m. FINDINGS: Large pleural effusion on the left and smaller pleural effusion on the right. Pulmonary vascular congestion and evidence of  interstitial edema. Left basilar consolidation with air bronchograms. Right base subsegmental atelectasis or mild consolidation. Right IJ CVC tip overlies proximal SVC. NG tube extends below the diaphragm and off the x-ray. Endotracheal tube tip at the thoracic inlet. IMPRESSION: Findings consistent with CHF. Left-greater-than-right pleural effusions. Left basilar consolidation. Electronically Signed   By: Fonda Field M.D.   On: 01/11/2024 16:00   DG Abd Portable 1V Result Date: 01/11/2024 CLINICAL DATA:  Tube placement. EXAM: PORTABLE ABDOMEN - 1 VIEW COMPARISON:  01/11/2024, 9 a.m. FINDINGS: Imaged including the upper abdomen and lower chest demonstrates an NG tube. The tip is superimposed with left upper quadrant, below the diaphragm. IMPRESSION: N/OGT in place. Electronically Signed   By: Fonda Field M.D.   On: 01/11/2024 15:59   EEG adult Result Date: 01/11/2024 Krebs Arlin MALVA, MD     01/11/2024  3:39 PM Patient Name: Alan Tapia. MRN: [id number] Epilepsy Attending: Arlin MALVA Krebs Referring Physician/Provider: Jenna Maude BRAVO, NP Date: 01/11/2024 Duration: 23.22 mins Patient history: 48yo M s/p cardiac arrest. EEG to evaluate for seizure Level of alertness: comatose AEDs during EEG study: Propofol  Technical aspects: This EEG study was done with scalp electrodes positioned according to the 10-20 International system of electrode placement. Electrical activity was reviewed with band pass filter of 1-70Hz , sensitivity of 7 uV/mm, display speed of 49mm/sec with a 60Hz  notched filter applied as appropriate. EEG data were recorded continuously and digitally stored.  Video monitoring was available and reviewed as appropriate. Description: Patient was noted to have episodes of brief sudden eye opening with whole body jerking every 2-20 seconds. Concomitant EEG showed generalized polyspikes consistent with myoclonic seizures.  In between seizures EEG showed generalized background suppression.  Hyperventilation and photic stimulation were not performed.   ABNORMALITY - Myoclonic seizure, generalized - Background suppression, generalized IMPRESSION: Patient  was noted to have myoclonic seizures every 2-20 seconds. Additionally there was evidence of profound diffuse encephalopathy. In the setting of cardiac arrest, this EEG pattern is suggestive of anoxic/hypoxic brain injury. Dr.Hunsucker and Maude Banner was notified. Arlin MALVA Krebs   ECHOCARDIOGRAM COMPLETE Result Date: 01/11/2024    ECHOCARDIOGRAM REPORT   Patient Name:   Alan Tapia. Date of Exam: 01/11/2024 Medical Rec #:  [id number]          Height:       76.0 in Accession #:    [id number]         Weight:       330.7 lb Date of Birth:  14-Sep-1975          BSA:          2.743 m Patient Age:    48 years           BP:           122/72 mmHg Patient Gender: M                  HR:           68 bpm. Exam Location:  Inpatient Procedure: 2D Echo, Cardiac Doppler and Color Doppler (Both Spectral and Color            Flow Doppler were utilized during procedure). Indications:    Cardiac arrest I46.9  History:        Patient has prior history of Echocardiogram examinations, most                 recent 12/04/2023. Risk Factors:Diabetes and Hypertension.  Sonographer:    Jayson Gaskins Referring Phys: [phone] PETER E BABCOCK IMPRESSIONS  1. Left ventricular ejection fraction, by estimation, is 40%. The left ventricle has mildly decreased function. The left ventricle demonstrates global hypokinesis. Left ventricular diastolic parameters are consistent with Grade II diastolic dysfunction (pseudonormalization).  2. Right ventricular systolic function is mildly reduced. The right ventricular size is normal. Tricuspid regurgitation signal is inadequate for assessing PA pressure.  3. Left atrial size was mildly dilated.  4. Large pleural effusion in the left lateral region.  5. The mitral valve is normal in structure. No evidence of mitral valve regurgitation.  6. The  aortic valve is tricuspid. Aortic valve regurgitation is not visualized. Comparison(s): Prior images reviewed side by side. The left ventricular function is unchanged. The right ventricular systolic function is worse. Left pleural effusion is larger. FINDINGS  Left Ventricle: Left ventricular ejection fraction, by estimation, is 40%. The left ventricle has mildly decreased function. The left ventricle demonstrates global hypokinesis. The left ventricular internal cavity size was normal in size. There is no left ventricular hypertrophy. Left ventricular diastolic parameters are consistent with Grade II diastolic dysfunction (pseudonormalization). Right Ventricle: The right ventricular size is normal. No increase in right ventricular wall thickness. Right ventricular systolic function is mildly reduced. Tricuspid regurgitation signal is inadequate for assessing PA pressure. Left Atrium: Left atrial size was mildly dilated. Right Atrium: Right atrial size was normal in size. Pericardium: Trivial pericardial effusion is present. Mitral Valve: The mitral valve is normal in structure. No evidence of mitral valve regurgitation. Tricuspid Valve: The tricuspid valve is not well visualized. Tricuspid valve regurgitation is not demonstrated. Aortic Valve: The aortic valve is tricuspid. Aortic valve regurgitation is not visualized. Aortic valve mean gradient measures 2.0 mmHg. Aortic valve peak gradient measures 4.0 mmHg. Aortic valve area, by VTI measures 3.23 cm. Pulmonic Valve: The  pulmonic valve was grossly normal. Pulmonic valve regurgitation is not visualized. No evidence of pulmonic stenosis. Aorta: The aortic root and ascending aorta are structurally normal, with no evidence of dilitation. Venous: IVC assessment for right atrial pressure unable to be performed due to mechanical ventilation. IAS/Shunts: The interatrial septum was not well visualized. Additional Comments: There is a large pleural effusion in the left  lateral region.  LEFT VENTRICLE PLAX 2D LVIDd:         5.60 cm   Diastology LVIDs:         4.50 cm   LV e' medial:    4.57 cm/s LV PW:         1.10 cm   LV E/e' medial:  14.3 LV IVS:        1.00 cm   LV e' lateral:   5.44 cm/s LVOT diam:     2.10 cm   LV E/e' lateral: 12.0 LV SV:         69 LV SV Index:   25 LVOT Area:     3.46 cm  RIGHT VENTRICLE RV S prime:     9.14 cm/s TAPSE (M-mode): 1.7 cm LEFT ATRIUM             Index        RIGHT ATRIUM           Index LA Vol (A2C):   44.8 ml 16.33 ml/m  RA Area:     16.70 cm LA Vol (A4C):   41.3 ml 15.06 ml/m  RA Volume:   50.70 ml  18.48 ml/m LA Biplane Vol: 43.5 ml 15.86 ml/m  AORTIC VALVE AV Area (Vmax):    3.21 cm AV Area (Vmean):   3.31 cm AV Area (VTI):     3.23 cm AV Vmax:           100.00 cm/s AV Vmean:          74.700 cm/s AV VTI:            0.212 m AV Peak Grad:      4.0 mmHg AV Mean Grad:      2.0 mmHg LVOT Vmax:         92.60 cm/s LVOT Vmean:        71.400 cm/s LVOT VTI:          0.198 m LVOT/AV VTI ratio: 0.93  AORTA Ao Root diam: 2.60 cm MITRAL VALVE MV Area (PHT): 2.22 cm    SHUNTS MV Decel Time: 341 msec    Systemic VTI:  0.20 m MV E velocity: 65.50 cm/s  Systemic Diam: 2.10 cm MV A velocity: 46.60 cm/s MV E/A ratio:  1.41 Mihai Croitoru MD Electronically signed by Jerel Balding MD Signature Date/Time: 01/11/2024/1:42:07 PM    Final    DG Chest Portable 1 View Result Date: 01/11/2024 CLINICAL DATA:  post CVC placement EXAM: PORTABLE CHEST 1 VIEW COMPARISON:  January 11, 2024. FINDINGS: Evaluation is limited by positioning. The cardiomediastinal silhouette is unchanged in contour.ETT tip terminates approximately 6.9 cm above the carina. The enteric tube courses through the chest to the abdomen beyond the field-of-view. RIGHT neck CVC tip is somewhat curved in course with the tip terminating over the expected area of the brachiocephalic confluence with the SVC. Large LEFT pleural effusion with improved aeration of the LEFT lung compared to most  recent prior. No significant pneumothorax. IMPRESSION: 1. RIGHT neck CVC tip is somewhat curved in course with the tip terminating over the  expected area of the brachiocephalic confluence with the SVC. Recommend correlation with catheter function and placement history. If continued uncertain anatomic location of the CVC, dedicated CT chest could be performed. 2. Large LEFT pleural effusion with improved aeration of the LEFT lung compared to most recent prior. Electronically Signed   By: Corean Salter M.D.   On: 01/11/2024 11:32   CT HEAD WO CONTRAST Result Date: 01/11/2024 CLINICAL DATA:  48 year old male with altered mental status. Status post CPR. Intubated. EXAM: CT HEAD WITHOUT CONTRAST TECHNIQUE: Contiguous axial images were obtained from the base of the skull through the vertex without intravenous contrast. RADIATION DOSE REDUCTION: This exam was performed according to the departmental dose-optimization program which includes automated exposure control, adjustment of the mA and/or kV according to patient size and/or use of iterative reconstruction technique. COMPARISON:  None Available. FINDINGS: Brain: No prior study for comparison. Normal cerebral volume. No midline shift, mass effect, or evidence of intracranial mass lesion. No ventriculomegaly. No acute intracranial hemorrhage identified. Questionable loss of bilateral gray-white differentiation (series 2, image 17 and series 3, image 34) although maintained cerebral sulci and no intracranial mass effect or overt cerebral edema. No discrete cortically based infarct. Vascular: No suspicious intracranial vascular hyperdensity. Skull: Intact.  No acute osseous abnormality identified. Sinuses/Orbits: Oral enteric tube and endotracheal tube visible. Visualized paranasal sinuses and mastoids are well aerated. Other: Some generalized scalp and face edema suspected. No discrete orbit or scalp soft tissue injury identified. IMPRESSION: 1. Difficult to  exclude anoxic injury; questionable partial loss of gray-white differentiation in both hemispheres on this exam with no prior comparison. But maintained cerebral sulci and no intracranial hemorrhage or mass effect. If decreased neurologic status persists repeat noncontrast Head CT in 24-48 hours or noncontrast Brain MRI recommended. 2. No other acute intracranial abnormality. Electronically Signed   By: VEAR Hurst M.D.   On: 01/11/2024 11:06   DG Chest Port 1 View Addendum Date: 01/11/2024 ADDENDUM REPORT: 01/11/2024 09:45 ADDENDUM: Study discussed by telephone with Dr. RYAN DIXON on 01/11/2024 at 09:44 . Electronically Signed   By: VEAR Hurst M.D.   On: 01/11/2024 09:45   Result Date: 01/11/2024 CLINICAL DATA:  48 year old male status post CPR. EXAM: PORTABLE CHEST 1 VIEW COMPARISON:  Portable chest 12/04/2023. FINDINGS: Portable AP supine view at 0859 hours. Substantially rotated to the left, more so than on the comparison. Endotracheal tube is visible in the trachea, tip at the level the clavicles. An enteric tube is looped over the neck in the midline. Poor ventilation of the left lung. July appearance suggested loculated pleural fluid, might have progressed. Veiling opacity over the right lung also, with moderate size right pleural effusion suspected previously. Poorly visible cardiac and mediastinal contours. No displaced rib fracture identified. IMPRESSION: 1. Leftward rotated portable chest. 2. Endotracheal tube tip at the level the clavicles. Enteric tube looped at the hypopharynx or proximal esophagus. 3. Poor ventilation of the left lung, possibly progression of left pleural effusion seen last month. Ongoing right lung veiling opacity, also likely related to effusion. Electronically Signed: By: VEAR Hurst M.D. On: 01/11/2024 09:25   DG Abd Portable 1V Addendum Date: 01/11/2024 ADDENDUM REPORT: 01/11/2024 09:44 ADDENDUM: Study discussed by telephone with Dr. RYAN DIXON on 01/11/2024 at 09:44 . Electronically  Signed   By: VEAR Hurst M.D.   On: 01/11/2024 09:44   Result Date: 01/11/2024 CLINICAL DATA:  48 year old male status post CPR. EXAM: PORTABLE ABDOMEN - 1 VIEW COMPARISON:  Portable chest today  0859 hours. FINDINGS: 4 portable views of the abdomen 0904 hours. Enteric tube seen looped over the lower neck on the comparison. Nonobstructed visible bowel gas pattern. Included lung bases suggest large left and moderate right pleural effusions as suspected on the comparison. No acute osseous abnormality identified. IMPRESSION: 1. Enteric tube seen looped over the lower neck on contemporary portable chest. Recommend removal and repositioning. 2. Nonobstructed visible bowel gas pattern. 3. Further evidence of large left and moderate right pleural effusions. Electronically Signed: By: VEAR Hurst M.D. On: 01/11/2024 09:26    Microbiology Recent Results (from the past 240 hours)  Culture, blood (Routine X 2) w Reflex to ID Panel     Status: None (Preliminary result)   Collection Time: 01/11/24  8:45 AM   Specimen: SHOULDER; Blood  Result Value Ref Range Status   Specimen Description SHOULDER RIGHT BLOOD  Final   Special Requests   Final    BOTTLES DRAWN AEROBIC AND ANAEROBIC Blood Culture adequate volume   Culture   Final    NO GROWTH 3 DAYS Performed at [facility], [address], [place], [name] [address]    Report Status PENDING  Incomplete  Culture, Respiratory w Gram Stain     Status: None   Collection Time: 01/11/24  8:45 AM   Specimen: Tracheal Aspirate; Respiratory  Result Value Ref Range Status   Specimen Description TRACHEAL ASPIRATE  Final   Special Requests NONE  Final   Gram Stain   Final    FEW WBC PRESENT,BOTH PMN AND MONONUCLEAR RARE GRAM POSITIVE COCCI IN PAIRS IN CHAINS RARE GRAM POSITIVE RODS    Culture   Final    MODERATE Normal respiratory flora-no Staph aureus or Pseudomonas seen Performed at [facility], [address] [place], [name] [address]    Report Status  01/13/2024 FINAL  Final  Urine Culture     Status: None   Collection Time: 01/11/24  8:45 AM   Specimen: Urine, Random  Result Value Ref Range Status   Specimen Description URINE, RANDOM  Final   Special Requests NONE Reflexed from [id number]  Final   Culture   Final    NO GROWTH Performed at [facility], [address] [place], [name] [address]    Report Status 01/12/2024 FINAL  Final  Culture, blood (Routine X 2) w Reflex to ID Panel     Status: None (Preliminary result)   Collection Time: 01/11/24  8:50 AM   Specimen: BLOOD  Result Value Ref Range Status   Specimen Description BLOOD SITE NOT SPECIFIED  Final   Special Requests   Final    BOTTLES DRAWN AEROBIC ONLY Blood Culture results may not be optimal due to an inadequate volume of blood received in culture bottles   Culture   Final    NO GROWTH 3 DAYS Performed at [facility], [address], [place], [name] [address]    Report Status PENDING  Incomplete  MRSA Next Gen by PCR, Nasal     Status: Abnormal   Collection Time: 01/11/24 11:00 AM   Specimen: Nasal Mucosa; Nasal Swab  Result Value Ref Range Status   MRSA by PCR Next Gen DETECTED (A) NOT DETECTED Final    Comment: RESULT CALLED TO, READ BACK BY AND VERIFIED WITH: RN JOSH HOOD ON 01/11/24 @ 1424 BY DRT (NOTE) The GeneXpert MRSA Assay (FDA approved for NASAL specimens only), is one component of a comprehensive MRSA colonization surveillance  program. It is not intended to diagnose MRSA infection nor to guide or monitor treatment for MRSA infections. Test performance is not FDA approved in patients less than 59 years old. Performed at [facility], [address], [place], [name] [address]     Lab Basic Metabolic Panel: Recent Labs  Lab 01/11/24 [phone] 01/11/24 0945 01/12/24 0347 01/12/24 0350 01/12/24 2010 01/13/24 0430 01/13/24 0437 January 30, 2024 0439  NA 142   < > 141 144  --  140 141 141  K 5.2*   < > 4.5 3.5  --  4.4 4.4 3.4*  CL 110   --   --  117*  --  107  --  107  CO2 16*  --   --  18*  --  23  --  21*  GLUCOSE 211*  --   --  60*  --  112*  --  127*  BUN 25*  --   --  22*  --  32*  --  36*  CREATININE 1.98*  --   --  1.70*  --  2.41*  --  2.62*  CALCIUM  7.8*  --   --  6.1*  --  7.6*  --  7.4*  MG 2.1  --   --  1.3* 2.6* 2.5*  --   --   PHOS 6.1*  --   --  2.6  --  4.5  --  5.0*   < > = values in this interval not displayed.   Liver Function Tests: Recent Labs  Lab 01/11/24 0851  AST 37  ALT 16  ALKPHOS 141*  BILITOT 0.5  PROT 5.9*  ALBUMIN  1.7*   No results for input(s): LIPASE, AMYLASE in the last 168 hours. No results for input(s): AMMONIA in the last 168 hours. CBC: Recent Labs  Lab 01/11/24 0851 01/11/24 0945 01/12/24 0347 01/12/24 0350 01/13/24 0430 01/13/24 0437 01/30/24 0439  WBC 17.4*  --   --  14.2* 14.6*  --  8.2  HGB 11.6*   < > 10.2* 8.4* 10.7* 10.5* 8.8*  HCT 41.5   < > 30.0* 27.6* 34.3* 31.0* 28.0*  MCV 91.8  --   --  85.7 82.5  --  82.6  PLT 488*  --   --  274 344  --  274   < > = values in this interval not displayed.   Cardiac Enzymes: Recent Labs  Lab 01/11/24 1145  CKTOTAL 409*  CKMB 12.2*   Sepsis Labs: Recent Labs  Lab 01/11/24 0851 01/11/24 0855 01/11/24 1145 01/11/24 1748 01/12/24 0350 01/13/24 0430 2024-01-30 0439  PROCALCITON  --   --  0.50  --  4.54 6.88  --   WBC 17.4*  --   --   --  14.2* 14.6* 8.2  LATICACIDVEN  --  4.6*  --  1.3  --   --   --     Procedures/Operations     [employer] 01-30-24, 4:02 PM

## 2024-02-10 NOTE — Progress Notes (Addendum)
 Advanced Heart Failure Rounding Note  Cardiologist: None  Chief Complaint: Out of hospital cardiac arrest  Subjective:    CVP 13. > 5L UOP last 24 hrs with lasix  gtt at 20/hr. Scr up further to 2.6 today.   MRI yesterday consistent with anoxic brain injury. Continuous myoclonic activity this am off propofol .    Objective:   Weight Range: (!) 158.1 kg Body mass index is 50.01 kg/m.   Vital Signs:   Temp:  [96.4 F (35.8 C)-99.9 F (37.7 C)] 98.8 F (37.1 C) 27-Jan-2024 0715) Pulse Rate:  [80-107] 95 27-Jan-2024 0715) Resp:  [6-33] 28 27-Jan-2024 0715) BP: (93-210)/(57-124) 120/64 2024/01/27 0715) SpO2:  [81 %-100 %] 100 % 2024-01-27 0715) FiO2 (%):  [40 %-100 %] 40 % 2024-01-27 0400) Weight:  [158.1 kg] 158.1 kg 01-27-2024 0500) Last BM Date : 01/13/24  Weight change: Filed Weights   01/12/24 0500 01/13/24 0422 01/27/2024 0500  Weight: (!) 163.4 kg (!) 156.5 kg (!) 158.1 kg    Intake/Output:   Intake/Output Summary (Last 24 hours) at 01-27-24 0757 Last data filed at 01-27-2024 0700 Gross per 24 hour  Intake 3140.57 ml  Output 5310 ml  Net -2169.43 ml      Physical Exam  General:  Acute on chronically ill appearing Cor: JVP 12-14. Regular rate & rhythm, tachy. No murmurs. Lungs: breathing nonlabored on vent Abdomen: obese, + edema Extremities: bilateral BKA, 2 + edema in thighs up to abdomen Neuro: Full body myoclonus off sedation.   Telemetry   ST 100s  Labs    CBC Recent Labs    01/13/24 0430 01/13/24 0437 27-Jan-2024 0439  WBC 14.6*  --  8.2  HGB 10.7* 10.5* 8.8*  HCT 34.3* 31.0* 28.0*  MCV 82.5  --  82.6  PLT 344  --  274   Basic Metabolic Panel Recent Labs    90/97/74 2010 01/13/24 0430 01/13/24 0437 01/27/24 0439  NA  --  140 141 141  K  --  4.4 4.4 3.4*  CL  --  107  --  107  CO2  --  23  --  21*  GLUCOSE  --  112*  --  127*  BUN  --  32*  --  36*  CREATININE  --  2.41*  --  2.62*  CALCIUM   --  7.6*  --  7.4*  MG 2.6* 2.5*  --   --   PHOS  --  4.5  --   5.0*   Liver Function Tests Recent Labs    01/11/24 0851  AST 37  ALT 16  ALKPHOS 141*  BILITOT 0.5  PROT 5.9*  ALBUMIN  1.7*   No results for input(s): LIPASE, AMYLASE in the last 72 hours. Cardiac Enzymes Recent Labs    01/11/24 1145  CKTOTAL 409*  CKMB 12.2*    BNP: BNP (last 3 results) Recent Labs    12/03/23 2010 01/11/24 0854 01/12/24 0350  BNP 898.0* 605.9* 539.7*    ProBNP (last 3 results) No results for input(s): PROBNP in the last 8760 hours.   D-Dimer No results for input(s): DDIMER in the last 72 hours. Hemoglobin A1C No results for input(s): HGBA1C in the last 72 hours. Fasting Lipid Panel Recent Labs    01/12/24 0350  TRIG 111   Thyroid Function Tests No results for input(s): TSH, T4TOTAL, T3FREE, THYROIDAB in the last 72 hours.  Invalid input(s): FREET3  Other results:   Imaging    MR BRAIN WO CONTRAST Result  Date: 01/13/2024 CLINICAL DATA:  Provided history: Anoxic brain damage. EXAM: MRI HEAD WITHOUT CONTRAST TECHNIQUE: Multiplanar, multiecho pulse sequences of the brain and surrounding structures were obtained without intravenous contrast. COMPARISON:  Head CT 01/11/2024. FINDINGS: Brain: Cerebral volume is normal. Extensive diffusion-weighted and T2 hyperintense signal abnormality throughout much of the bilateral cerebral cortex and within the bilateral deep gray nuclei. Subtle diffusion-weighted signal abnormality is also suspected within the cerebellum. These findings are consistent with acute hypoxic/ischemic injury. No significant effacement of the basal cisterns at this time. No evidence of brain herniation. Small T2 hyperintense focus within the superior right cerebellar hemisphere, likely reflecting a prominent perivascular space. No cortical encephalomalacia is identified. No evidence of an intracranial mass. No chronic intracranial blood products. No extra-axial fluid collection. No midline shift. Vascular:  Maintained flow voids within the proximal large arterial vessels. Skull and upper cervical spine: No focal worrisome marrow lesion. Sinuses/Orbits: No mass or acute finding within the imaged orbits. Mild mucosal thickening within bilateral ethmoid air cells. Small fluid levels, and mild background mucosal thickening, within the bilateral sphenoid sinuses. Other: Small-volume fluid within bilateral mastoid air cells. These results will be called to the ordering clinician or representative by the Radiologist Assistant, and communication documented in the PACS or [employer]. IMPRESSION: Extensive signal abnormality throughout much of the bilateral cerebral cortex and within the bilateral deep gray nuclei. Subtle diffusion-weighted signal abnormality is also suspected within the cerebellum. These findings are consistent with acute hypoxic/ischemic injury. No significant effacement of the basal cisterns at this time. No evidence of brain herniation. Electronically Signed   By: Rockey Childs D.O.   On: 01/13/2024 15:22     Medications:     Scheduled Medications:  aspirin   81 mg Per Tube Daily   Or   aspirin   300 mg Rectal Daily   Chlorhexidine  Gluconate Cloth  6 each Topical Daily   docusate  100 mg Per Tube BID   famotidine   20 mg Per Tube Daily   feeding supplement (PROSource TF20)  60 mL Per Tube TID   heparin  injection (subcutaneous)  5,000 Units Subcutaneous Q8H   insulin  aspart  0-6 Units Subcutaneous Q4H   polyethylene glycol  17 g Per Tube Daily    Infusions:  cefTRIAXone  (ROCEPHIN )  IV Stopped (01/13/24 1252)   dextrose  50 mL/hr at 2024/01/26 0700   feeding supplement (VITAL AF 1.2 CAL) 55 mL/hr at 01-26-24 0700   furosemide  (LASIX ) 200 mg in dextrose  5 % 100 mL (2 mg/mL) infusion 20 mg/hr (2024/01/26 0700)   propofol  (DIPRIVAN ) infusion 20 mcg/kg/min (January 26, 2024 0700)   valproate sodium  55 mL/hr at 26-Jan-2024 0700    PRN Medications: acetaminophen , docusate, fentaNYL , fentaNYL   (SUBLIMAZE ) injection, fentaNYL  (SUBLIMAZE ) injection, Gerhardt's butt cream, hydrALAZINE , labetalol , polyethylene glycol    Patient Profile   48 y.o. male with hx superobesity, DM II, PAD s/p bilateral BKA, HFrEF (EF 35-40% range dating back to 2024). Presenting with OOH resuscitated Vfib arrest c/b anoxic brain jury.  Assessment/Plan   OOH Cardiac Arrest with ventricular fibrillation. -Witnessed Vfib arrest 01/11/24. Received 19 minutes ACLS w/ 3 shocks and 3 doses of Epi in the field. -Previous EF of 35 to 40% assumed to be nonischemic but has not had formal ischemic workup. -Echo this admission EF 40%. -Peak high-sensitivity troponin 74.  Suggestive of sudden cardiac death in setting of known cardiomyopathy. Defer ischemic workup d/t poor prognosis. No recurrent rhythm issues - Head CT and MRI c/w anoxic brain injury. EEG 9/3 w/  subcortical myoclonus but no epileptic event.  - Myoclonus noted this am off propofol  - Extremely low likelihood of meaningful recovery  Anoxic brain injury - Plan as above.  Acute on chronic systolic heart failure -Good diuresis with lasix  gtt at 20/hr. Remains volume overloaded, will continue. Scr worse, 1.7>>2.6, ? If this is due to ATN from hypotension in setting of arrest rather than diuresis.  -Starting hydralazine  for hypertension -GDMT limited by AKI, limited role with poor prognosis  Acute hypoxic respiratory failure in setting of cardiac arrest. -Vent management per CCM -Covering for aspiration with ceftriaxone .  PAD -S/p b/l BKA  Diabetes type 2 - Hypoglycemia improved - On dextrose  infusion and getting tube feeds  GOC - Remains full code.  - Poor neuro prognosis. Minimal chance of meaningful recovery. CCM planning to discuss goals of care with family. Transition to comfort care recommended.   CRITICAL CARE Performed by: COLLETTA SHAVER N   Total critical care time: 14 minutes  Critical care time was exclusive of separately  billable procedures and treating other patients.  Critical care was necessary to treat or prevent imminent or life-threatening deterioration.  Critical care was time spent personally by me on the following activities: development of treatment plan with patient and/or surrogate as well as nursing, discussions with consultants, evaluation of patient's response to treatment, examination of patient, obtaining history from patient or surrogate, ordering and performing treatments and interventions, ordering and review of laboratory studies, ordering and review of radiographic studies, pulse oximetry and re-evaluation of patient's condition.    Length of Stay: 3  FINCH, LINDSAY N, PA-C  7:57 AM   Advanced Heart Failure Team Pager [phone] (M-F; 7a - 5p)  Please contact [facility] for night-coverage after hours (5p -7a ) and weekends on [email or web address]  Agree with above.  He remains unresponsive on the ventilator.  He has diffuse myoclonic activity anytime sedation is held.  Brain MRI yesterday showed severe anoxic brain injury.  He is off all inotropic agent.  He continues on Lasix  drip with excellent urine output.  Rhythm is stable.  Renal function continues to slowly worsen.  On exam he is on the vent and unresponsive. HEENT: Endotracheal tube in place Neck supple right IJ central line.  Positive JVD Cardiac regular rate and rhythm. Abdomen obese nontender nondistended. Extremities status post bilateral BKA.  Mild edema. Neuro unresponsive on the vent.  diffuse myoclonus once sedation is weaned.  He has severe anoxic brain injury status post ventricular fibrillation arrest.  Have discussed the case with critical care medicine.  Unfortunately there is no hope for meaningful survival at this point.  My discussions are ongoing.  His rhythm has been stable.  He is diuresing well continue Lasix  for today.  Agree with plans to shift to comfort care when family agreeable.  [facility] heart failure  team will sign off at this point.  Please contact us  with any questions.  Critical care time 45 minutes.  Toribio Fuel, MD  11:04 AM

## 2024-02-10 NOTE — Progress Notes (Addendum)
 NAME:  Alan Humphrey., MRN:  [id number], DOB:  1976/03/28, LOS: 3 ADMISSION DATE:  01/11/2024, CONSULTATION DATE:  9/1 REFERRING MD:  dixon, CHIEF COMPLAINT:  cardiac arrest   History of Present Illness:  48 year old male patient with known history of severe peripheral vascular disease status post bilateral BKA, neuropathy, superobesity, type 2 diabetes, and hypertension, as well as newly diagnosed biventricular heart failure with estimated ejection fraction 35 to 40% with global hypokinesis, this was identified during hospitalization in July 2025, he was ultimately discharged to home on 7/29 after diuresing almost 15 L of fluid with IV bumetanide  (reported allergy to Lasix  with hives) was supposed to be discharged to home on oral bumetanide , losartan , isosorbide , beta-blockade and spironolactone .  Since discharge he has essentially been bedbound.  Unable to get up in wheelchair.  His wife has been doing approximately 2 hours a day of activity including placement of his prosthetics on his lower extremities.  Of note since discharge the patient has not been taking his Bumex , his hydralazine , or his Cozaar  stating to his wife he was allergic to these medications, and she witnessed what she felt was seizure activity following these meds  Presents on 9/1 status post VF arrest.  Per wife patient started to have notable physical accumulation of excess fluid and edema over about the last week's time, on 8/31 began to exhibit worsening shortness of breath and orthopnea, asking his wife to raise the bed up more stating he had fluid building up.  On the a.m. of 9/1 had slid out of bed, wife unsure if also hit his head.  She originally called EMS for lift assist.  But by the time EMS arrived he became pulseless and unresponsive, essentially as they walked in the door.  He was shocked 3 times, had 3 doses of epinephrine , estimated time to return of spontaneous circulation was 19 minutes he was intubated in the  field with a #7 endotracheal tube, initially placed on epinephrine  infusion, and transferred to the emergency room  In the emergency room the team was able to wean the epinephrine  infusion off.  Initially a right deltoid IO was placed.  He was unresponsive, exhibiting a cough reflex, not following commands, and demonstrating persistent bursts of myoclonic type activity in all extremities  A central line was placed by the emergency room physician, changes in the mechanical ventilator were made to optimize minute ventilation, cardiology was consulted, and diagnostic labs sent.  He was admitted to the critical care service  Pertinent  Medical History  Unresponsive with persistent myoclonic type activity.  Currently hemodynamically stable.  Significant Hospital Events: Including procedures, antibiotic start and stop dates in addition to other pertinent events   9/1 admitted postarrest myoclonic activity on arrival estimated time to resuscitation estimated at 19 minutes, initial rhythm VF 9/1 LTM >anoxic injury 9/2 IPAL conversation with pt's spouse and father  9/3 Propofol  up from 20 to 30 due to myoclonus, parents possibly arriving today. MRI w anoxic brain injury  01/21/24 dc prop.   Interim History / Subjective:  VS, labs, imaging, notes reviewed   MRI yesterday with evidence of anoxic brain injury EEG c/w subcortical myoclonus but not an epileptic event   Cr up to 2.62, K 3.4, phos 5 Hgb 8.8  CBGs overnight 66-109  UOP 5.3L 9/3 on lasix  gtt. CVP 9 this morning   Objective    Blood pressure (!) 182/104, pulse (!) 112, temperature 100 F (37.8 C), resp. rate (!) 37, height 5'  10 (1.778 m), weight (!) 158.1 kg, SpO2 98%. CVP:  [0 mmHg-24 mmHg] 7 mmHg  Vent Mode: PRVC FiO2 (%):  [40 %-100 %] 40 % Set Rate:  [28 bmp] 28 bmp Vt Set:  [580 mL] 580 mL PEEP:  [10 cmH20] 10 cmH20 Plateau Pressure:  [20 cmH20-23 cmH20] 21 cmH20   Intake/Output Summary (Last 24 hours) at Jan 21, 2024 0901 Last  data filed at 01/21/24 0800 Gross per 24 hour  Intake 2949.66 ml  Output 4775 ml  Net -1825.34 ml   Filed Weights   01/12/24 0500 01/13/24 0422 01-21-2024 0500  Weight: (!) 163.4 kg (!) 156.5 kg (!) 158.1 kg    Examination: General: Chronically and critically ill middle aged M  Neuro: 20 or prop paused for exam- initially BLE myoclonus which progressed to full body. Does initiate breaths. Does not follow command. Intermittent eye opening to pain.   HEENT: NCAT pink mm anicteric sclera  Cardiovascular: tachycardic, regular  Lungs: mechanically ventilated, diminished bases  Abdomen: obese round  GU: foley w yellow urine  Musculoskeletal: Bilat BKA. Anasarca   Skin: c/d/w   Assessment and Plan   VF arrest Anoxic brain injury / Anoxic encephalopathy Myoclonus  Myoclonic sz, improved -MRI 9/3 with evidence of severe anoxic injury, which is c/w concerns re his initial CT H, his physical exam findings + clinical course w early myoclonic sz then ongoing myoclonus post arrest  -9/3 eeg without epileptic event, but did have ongoing subcortical myoclonus  P -stop prop; has been used for myoclonus but in absence of sz now will stop. D/w my attending.  -cont VPA -neuro prognosis for meaningful recovery is exceedingly poor. Have asked RN to let me know when family arrives so we can discuss goals of care.   Acute respiratory failure w hypoxia L>R pleural effusion LLL PNA Pulmonary edema  P -I decr PEEP and RR 01/21/24, d/w RT  -cont rocephin   -lasix  gtt as below  -cont VAP, pulm hygiene measures -daily SBT however his devastating neuro injury precludes extubation candidacy.   AoC BiV HF  HTN  P -cards is following  -cont on lasix  gtt + PRN lyte replacement  -dont think there is utility in following coox at this juncture, if he were to develop shock could recheck  -adding home dose hydral: 80 q8 for his HTN. Also has PRNs available. If consistently reaching for PRNs after adding [facility],  can also look at adding metop which is a home med   AKI on CKD II Hypokalemia Hyperphosphatemia  P -replace K -on lasix  gtt as above   PVD  S/p bilat BKA  P -ASA   IDDM with hypoglycemia  P -dc SSI -cont d10 gtt and EN  -follow CBGs   Obesity III  P -complicates chronic dz states/exacerbations but this is not something that there is an acute intervention for.   Anemia -iatrogenic losses  P -PRN CBC  Goals of care P -poor neuro prognosis. Need to discuss QOL goals and realistic trajectory with family & will recommend transition to comfort care     CRITICAL CARE Performed by: Ronnald FORBES Gave  Total critical care time: 49 minutes  Critical care time was exclusive of separately billable procedures and treating other patients. Critical care was necessary to treat or prevent imminent or life-threatening deterioration.  Critical care was time spent personally by me on the following activities: development of treatment plan with patient and/or surrogate as well as nursing, discussions with consultants, evaluation of patient's  response to treatment, examination of patient, obtaining history from patient or surrogate, ordering and performing treatments and interventions, ordering and review of laboratory studies, ordering and review of radiographic studies, pulse oximetry and re-evaluation of patient's condition.   Ronnald Gave MSN, AGACNP-BC [facility] Amion for pager  February 04, 2024, 9:01 AM

## 2024-02-10 NOTE — Progress Notes (Signed)
 RT NOTE: PT extubated to comfort care per order/family wishes.

## 2024-02-10 NOTE — IPAL (Addendum)
  Addendum: Family has decided to transition to comfort care Orders for comfort meds placed. Cont VPA. DNR   At this point he has a robust resp drive, anticipate hours-days most likely.   Interdisciplinary Goals of Care Family Meeting   Date carried out: 01/30/2024  Location of the meeting: Bedside  Member's involved: Nurse Practitioner, Bedside Registered Nurse, Chaplain, and Family Member or next of kin  Durable Power of Attorney or acting medical decision maker: Wife [name]     Discussion: We discussed goals of care for [employer]. .  Parents, wife, cousin, pastor, and friend joined myself and the pts RN today, [place]. We talked about Eddie's devastating neurologic injury. 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  We discussed quality of life outcomes with this sort of anoxic injury. Parents do not feel like this would be acceptable for the patient. We talked about transitioning to comfort care, which parents think is more appropriate.  Family has asked for a few minutes to discuss. Will revisit this afternoon.   Code status:   Code Status: Full Code   Disposition: Continue current acute care  Time spent for the meeting:    Ronnald FORBES Gave, NP  30-Jan-2024, 12:48 PM

## 2024-02-10 DEATH — deceased
